# Patient Record
Sex: Male | Born: 1950 | Race: White | Hispanic: No | Smoking: Never smoker
Health system: Southern US, Community
[De-identification: ages and names within clinical notes are randomized; demographics above are authoritative.]

## PROBLEM LIST (undated history)

## (undated) DIAGNOSIS — M1990 Unspecified osteoarthritis, unspecified site: Secondary | ICD-10-CM

## (undated) DIAGNOSIS — E785 Hyperlipidemia, unspecified: Secondary | ICD-10-CM

## (undated) DIAGNOSIS — I10 Essential (primary) hypertension: Secondary | ICD-10-CM

## (undated) DIAGNOSIS — Z9989 Dependence on other enabling machines and devices: Secondary | ICD-10-CM

## (undated) DIAGNOSIS — F419 Anxiety disorder, unspecified: Secondary | ICD-10-CM

## (undated) DIAGNOSIS — H5440 Blindness, one eye, unspecified eye: Secondary | ICD-10-CM

## (undated) DIAGNOSIS — Z87448 Personal history of other diseases of urinary system: Secondary | ICD-10-CM

## (undated) DIAGNOSIS — G4733 Obstructive sleep apnea (adult) (pediatric): Secondary | ICD-10-CM

## (undated) DIAGNOSIS — I5032 Chronic diastolic (congestive) heart failure: Secondary | ICD-10-CM

## (undated) DIAGNOSIS — E119 Type 2 diabetes mellitus without complications: Secondary | ICD-10-CM

## (undated) DIAGNOSIS — Z8614 Personal history of Methicillin resistant Staphylococcus aureus infection: Secondary | ICD-10-CM

## (undated) DIAGNOSIS — K759 Inflammatory liver disease, unspecified: Secondary | ICD-10-CM

## (undated) DIAGNOSIS — I350 Nonrheumatic aortic (valve) stenosis: Secondary | ICD-10-CM

## (undated) HISTORY — DX: Anxiety disorder, unspecified: F41.9

## (undated) HISTORY — DX: Chronic diastolic (congestive) heart failure: I50.32

## (undated) HISTORY — DX: Blindness, one eye, unspecified eye: H54.40

## (undated) HISTORY — DX: Inflammatory liver disease, unspecified: K75.9

## (undated) HISTORY — DX: Personal history of Methicillin resistant Staphylococcus aureus infection: Z86.14

## (undated) HISTORY — PX: BACK SURGERY: SHX140

## (undated) HISTORY — DX: Essential (primary) hypertension: I10

## (undated) HISTORY — PX: CARDIAC CATHETERIZATION: SHX172

## (undated) HISTORY — PX: CARDIAC VALVE REPLACEMENT: SHX585

## (undated) HISTORY — DX: Hyperlipidemia, unspecified: E78.5

## (undated) HISTORY — DX: Nonrheumatic aortic (valve) stenosis: I35.0

## (undated) HISTORY — DX: Type 2 diabetes mellitus without complications: E11.9

---

## 1983-10-30 HISTORY — PX: LUMBAR LAMINECTOMY: SHX95

## 1998-02-01 ENCOUNTER — Encounter: Admission: RE | Admit: 1998-02-01 | Discharge: 1998-02-01 | Payer: Self-pay | Admitting: Sports Medicine

## 1998-02-24 ENCOUNTER — Inpatient Hospital Stay (HOSPITAL_COMMUNITY): Admission: EM | Admit: 1998-02-24 | Discharge: 1998-02-25 | Payer: Self-pay | Admitting: Emergency Medicine

## 1998-03-18 ENCOUNTER — Encounter: Admission: RE | Admit: 1998-03-18 | Discharge: 1998-03-18 | Payer: Self-pay | Admitting: Family Medicine

## 1998-03-18 ENCOUNTER — Other Ambulatory Visit: Admission: RE | Admit: 1998-03-18 | Discharge: 1998-03-18 | Payer: Self-pay | Admitting: *Deleted

## 1998-04-20 ENCOUNTER — Encounter: Admission: RE | Admit: 1998-04-20 | Discharge: 1998-04-20 | Payer: Self-pay | Admitting: Family Medicine

## 1998-09-05 ENCOUNTER — Encounter: Admission: RE | Admit: 1998-09-05 | Discharge: 1998-09-05 | Payer: Self-pay | Admitting: Family Medicine

## 1998-09-21 ENCOUNTER — Encounter: Admission: RE | Admit: 1998-09-21 | Discharge: 1998-09-21 | Payer: Self-pay | Admitting: Family Medicine

## 1998-10-24 ENCOUNTER — Encounter: Admission: RE | Admit: 1998-10-24 | Discharge: 1998-10-24 | Payer: Self-pay | Admitting: Family Medicine

## 1998-10-31 ENCOUNTER — Encounter: Admission: RE | Admit: 1998-10-31 | Discharge: 1998-10-31 | Payer: Self-pay | Admitting: Family Medicine

## 1999-03-13 ENCOUNTER — Encounter: Admission: RE | Admit: 1999-03-13 | Discharge: 1999-03-13 | Payer: Self-pay | Admitting: Family Medicine

## 1999-04-24 ENCOUNTER — Encounter: Admission: RE | Admit: 1999-04-24 | Discharge: 1999-04-24 | Payer: Self-pay | Admitting: Family Medicine

## 1999-07-28 ENCOUNTER — Encounter: Admission: RE | Admit: 1999-07-28 | Discharge: 1999-07-28 | Payer: Self-pay | Admitting: Family Medicine

## 1999-10-04 ENCOUNTER — Encounter: Admission: RE | Admit: 1999-10-04 | Discharge: 1999-10-04 | Payer: Self-pay | Admitting: Family Medicine

## 2000-01-03 ENCOUNTER — Encounter: Payer: Self-pay | Admitting: Sports Medicine

## 2000-01-03 ENCOUNTER — Encounter: Admission: RE | Admit: 2000-01-03 | Discharge: 2000-01-03 | Payer: Self-pay | Admitting: Sports Medicine

## 2000-01-03 ENCOUNTER — Encounter: Admission: RE | Admit: 2000-01-03 | Discharge: 2000-01-03 | Payer: Self-pay | Admitting: Family Medicine

## 2000-01-17 ENCOUNTER — Encounter: Admission: RE | Admit: 2000-01-17 | Discharge: 2000-01-17 | Payer: Self-pay | Admitting: Family Medicine

## 2000-02-15 ENCOUNTER — Encounter: Admission: RE | Admit: 2000-02-15 | Discharge: 2000-02-15 | Payer: Self-pay | Admitting: Sports Medicine

## 2000-02-16 ENCOUNTER — Encounter: Admission: RE | Admit: 2000-02-16 | Discharge: 2000-02-16 | Payer: Self-pay | Admitting: Family Medicine

## 2000-02-22 ENCOUNTER — Encounter: Admission: RE | Admit: 2000-02-22 | Discharge: 2000-02-22 | Payer: Self-pay | Admitting: Family Medicine

## 2000-06-18 ENCOUNTER — Encounter: Admission: RE | Admit: 2000-06-18 | Discharge: 2000-06-18 | Payer: Self-pay | Admitting: Sports Medicine

## 2000-12-05 ENCOUNTER — Encounter: Admission: RE | Admit: 2000-12-05 | Discharge: 2000-12-05 | Payer: Self-pay | Admitting: Family Medicine

## 2000-12-31 ENCOUNTER — Encounter: Admission: RE | Admit: 2000-12-31 | Discharge: 2000-12-31 | Payer: Self-pay | Admitting: Family Medicine

## 2001-02-04 ENCOUNTER — Encounter: Admission: RE | Admit: 2001-02-04 | Discharge: 2001-02-04 | Payer: Self-pay | Admitting: Family Medicine

## 2001-02-19 ENCOUNTER — Encounter: Admission: RE | Admit: 2001-02-19 | Discharge: 2001-02-19 | Payer: Self-pay | Admitting: Family Medicine

## 2001-03-05 ENCOUNTER — Encounter: Admission: RE | Admit: 2001-03-05 | Discharge: 2001-03-05 | Payer: Self-pay | Admitting: Family Medicine

## 2001-04-04 ENCOUNTER — Encounter: Admission: RE | Admit: 2001-04-04 | Discharge: 2001-04-04 | Payer: Self-pay | Admitting: Family Medicine

## 2001-04-28 ENCOUNTER — Encounter: Admission: RE | Admit: 2001-04-28 | Discharge: 2001-04-28 | Payer: Self-pay | Admitting: Family Medicine

## 2001-04-28 ENCOUNTER — Ambulatory Visit (HOSPITAL_COMMUNITY): Admission: RE | Admit: 2001-04-28 | Discharge: 2001-04-28 | Payer: Self-pay | Admitting: Family Medicine

## 2001-06-25 ENCOUNTER — Encounter: Admission: RE | Admit: 2001-06-25 | Discharge: 2001-06-25 | Payer: Self-pay | Admitting: Family Medicine

## 2001-11-18 ENCOUNTER — Encounter: Admission: RE | Admit: 2001-11-18 | Discharge: 2001-11-18 | Payer: Self-pay | Admitting: Family Medicine

## 2001-12-05 ENCOUNTER — Encounter: Admission: RE | Admit: 2001-12-05 | Discharge: 2001-12-05 | Payer: Self-pay | Admitting: Family Medicine

## 2001-12-22 ENCOUNTER — Encounter: Admission: RE | Admit: 2001-12-22 | Discharge: 2001-12-22 | Payer: Self-pay | Admitting: Family Medicine

## 2002-01-08 ENCOUNTER — Encounter: Admission: RE | Admit: 2002-01-08 | Discharge: 2002-01-08 | Payer: Self-pay | Admitting: Family Medicine

## 2002-02-02 ENCOUNTER — Encounter: Admission: RE | Admit: 2002-02-02 | Discharge: 2002-02-02 | Payer: Self-pay | Admitting: Family Medicine

## 2002-02-09 ENCOUNTER — Emergency Department (HOSPITAL_COMMUNITY): Admission: EM | Admit: 2002-02-09 | Discharge: 2002-02-09 | Payer: Self-pay | Admitting: Emergency Medicine

## 2002-02-24 ENCOUNTER — Encounter: Admission: RE | Admit: 2002-02-24 | Discharge: 2002-02-24 | Payer: Self-pay | Admitting: Family Medicine

## 2002-12-08 ENCOUNTER — Encounter: Admission: RE | Admit: 2002-12-08 | Discharge: 2002-12-08 | Payer: Self-pay | Admitting: Family Medicine

## 2002-12-23 ENCOUNTER — Encounter: Admission: RE | Admit: 2002-12-23 | Discharge: 2002-12-23 | Payer: Self-pay | Admitting: Family Medicine

## 2003-01-14 ENCOUNTER — Encounter: Admission: RE | Admit: 2003-01-14 | Discharge: 2003-01-14 | Payer: Self-pay | Admitting: Family Medicine

## 2003-01-29 ENCOUNTER — Encounter: Admission: RE | Admit: 2003-01-29 | Discharge: 2003-01-29 | Payer: Self-pay | Admitting: Family Medicine

## 2003-02-25 ENCOUNTER — Encounter: Payer: Self-pay | Admitting: Sports Medicine

## 2003-02-25 ENCOUNTER — Encounter: Admission: RE | Admit: 2003-02-25 | Discharge: 2003-02-25 | Payer: Self-pay | Admitting: Sports Medicine

## 2003-02-25 ENCOUNTER — Encounter: Admission: RE | Admit: 2003-02-25 | Discharge: 2003-02-25 | Payer: Self-pay | Admitting: Family Medicine

## 2003-03-04 ENCOUNTER — Encounter: Admission: RE | Admit: 2003-03-04 | Discharge: 2003-03-04 | Payer: Self-pay | Admitting: Family Medicine

## 2003-03-15 ENCOUNTER — Encounter: Admission: RE | Admit: 2003-03-15 | Discharge: 2003-03-22 | Payer: Self-pay | Admitting: Sports Medicine

## 2003-03-18 ENCOUNTER — Encounter: Admission: RE | Admit: 2003-03-18 | Discharge: 2003-03-18 | Payer: Self-pay | Admitting: Sports Medicine

## 2003-04-06 ENCOUNTER — Encounter: Admission: RE | Admit: 2003-04-06 | Discharge: 2003-04-06 | Payer: Self-pay | Admitting: Sports Medicine

## 2003-05-31 ENCOUNTER — Encounter: Admission: RE | Admit: 2003-05-31 | Discharge: 2003-05-31 | Payer: Self-pay | Admitting: Sports Medicine

## 2003-06-01 ENCOUNTER — Encounter: Admission: RE | Admit: 2003-06-01 | Discharge: 2003-06-01 | Payer: Self-pay | Admitting: Family Medicine

## 2003-06-25 ENCOUNTER — Encounter: Admission: RE | Admit: 2003-06-25 | Discharge: 2003-06-25 | Payer: Self-pay | Admitting: Family Medicine

## 2003-08-17 ENCOUNTER — Encounter: Admission: RE | Admit: 2003-08-17 | Discharge: 2003-08-17 | Payer: Self-pay | Admitting: Sports Medicine

## 2003-09-10 ENCOUNTER — Emergency Department (HOSPITAL_COMMUNITY): Admission: EM | Admit: 2003-09-10 | Discharge: 2003-09-10 | Payer: Self-pay | Admitting: Emergency Medicine

## 2003-12-20 ENCOUNTER — Encounter: Admission: RE | Admit: 2003-12-20 | Discharge: 2003-12-20 | Payer: Self-pay | Admitting: Family Medicine

## 2004-09-01 ENCOUNTER — Ambulatory Visit: Payer: Self-pay | Admitting: Family Medicine

## 2004-10-03 ENCOUNTER — Ambulatory Visit (HOSPITAL_COMMUNITY): Admission: RE | Admit: 2004-10-03 | Discharge: 2004-10-03 | Payer: Self-pay | Admitting: Sports Medicine

## 2004-10-03 ENCOUNTER — Encounter (INDEPENDENT_AMBULATORY_CARE_PROVIDER_SITE_OTHER): Payer: Self-pay | Admitting: Cardiology

## 2004-10-19 ENCOUNTER — Ambulatory Visit: Payer: Self-pay | Admitting: Family Medicine

## 2004-11-14 ENCOUNTER — Encounter: Admission: RE | Admit: 2004-11-14 | Discharge: 2005-02-12 | Payer: Self-pay | Admitting: Family Medicine

## 2005-02-20 ENCOUNTER — Ambulatory Visit: Payer: Self-pay | Admitting: Family Medicine

## 2005-03-08 ENCOUNTER — Ambulatory Visit: Payer: Self-pay | Admitting: Family Medicine

## 2005-07-31 ENCOUNTER — Ambulatory Visit: Payer: Self-pay | Admitting: Sports Medicine

## 2005-08-08 ENCOUNTER — Ambulatory Visit: Payer: Self-pay | Admitting: Family Medicine

## 2005-08-21 ENCOUNTER — Ambulatory Visit: Payer: Self-pay | Admitting: Gastroenterology

## 2005-08-29 HISTORY — PX: INCISION AND DRAINAGE ABSCESS: SHX5864

## 2005-09-03 ENCOUNTER — Ambulatory Visit: Payer: Self-pay | Admitting: Family Medicine

## 2005-09-04 ENCOUNTER — Ambulatory Visit: Payer: Self-pay | Admitting: Infectious Diseases

## 2005-09-04 ENCOUNTER — Ambulatory Visit: Payer: Self-pay | Admitting: Family Medicine

## 2005-09-04 ENCOUNTER — Inpatient Hospital Stay (HOSPITAL_COMMUNITY): Admission: AD | Admit: 2005-09-04 | Discharge: 2005-09-17 | Payer: Self-pay | Admitting: General Surgery

## 2005-09-10 ENCOUNTER — Encounter: Payer: Self-pay | Admitting: Internal Medicine

## 2005-09-10 ENCOUNTER — Ambulatory Visit: Payer: Self-pay | Admitting: Internal Medicine

## 2005-09-11 ENCOUNTER — Ambulatory Visit: Payer: Self-pay | Admitting: Internal Medicine

## 2005-09-12 ENCOUNTER — Ambulatory Visit: Payer: Self-pay | Admitting: Cardiology

## 2005-09-12 ENCOUNTER — Encounter: Payer: Self-pay | Admitting: Cardiology

## 2005-09-19 ENCOUNTER — Ambulatory Visit: Payer: Self-pay | Admitting: Family Medicine

## 2005-10-10 ENCOUNTER — Ambulatory Visit: Payer: Self-pay | Admitting: Family Medicine

## 2005-11-02 ENCOUNTER — Ambulatory Visit: Payer: Self-pay | Admitting: Family Medicine

## 2006-02-22 ENCOUNTER — Ambulatory Visit: Payer: Self-pay | Admitting: Family Medicine

## 2006-02-28 ENCOUNTER — Ambulatory Visit: Payer: Self-pay | Admitting: Family Medicine

## 2006-07-22 ENCOUNTER — Ambulatory Visit: Payer: Self-pay | Admitting: Family Medicine

## 2006-07-26 ENCOUNTER — Ambulatory Visit: Payer: Self-pay | Admitting: Family Medicine

## 2006-07-31 ENCOUNTER — Ambulatory Visit: Payer: Self-pay | Admitting: Family Medicine

## 2006-09-02 ENCOUNTER — Ambulatory Visit: Payer: Self-pay | Admitting: Sports Medicine

## 2006-12-26 DIAGNOSIS — E669 Obesity, unspecified: Secondary | ICD-10-CM

## 2006-12-26 DIAGNOSIS — I10 Essential (primary) hypertension: Secondary | ICD-10-CM | POA: Insufficient documentation

## 2006-12-26 DIAGNOSIS — E785 Hyperlipidemia, unspecified: Secondary | ICD-10-CM

## 2006-12-26 DIAGNOSIS — M109 Gout, unspecified: Secondary | ICD-10-CM | POA: Insufficient documentation

## 2006-12-26 DIAGNOSIS — I871 Compression of vein: Secondary | ICD-10-CM

## 2006-12-26 DIAGNOSIS — M479 Spondylosis, unspecified: Secondary | ICD-10-CM | POA: Insufficient documentation

## 2007-06-03 ENCOUNTER — Telehealth (INDEPENDENT_AMBULATORY_CARE_PROVIDER_SITE_OTHER): Payer: Self-pay | Admitting: *Deleted

## 2007-06-12 ENCOUNTER — Ambulatory Visit: Payer: Self-pay | Admitting: Family Medicine

## 2007-06-12 ENCOUNTER — Encounter (INDEPENDENT_AMBULATORY_CARE_PROVIDER_SITE_OTHER): Payer: Self-pay | Admitting: Family Medicine

## 2007-06-12 LAB — CONVERTED CEMR LAB
ALT: 22 units/L (ref 0–53)
AST: 19 units/L (ref 0–37)
Albumin: 4 g/dL (ref 3.5–5.2)
Alkaline Phosphatase: 117 units/L (ref 39–117)
BUN: 18 mg/dL (ref 6–23)
Bilirubin Urine: NEGATIVE
Blood in Urine, dipstick: NEGATIVE
CO2: 28 meq/L (ref 19–32)
Calcium: 8.8 mg/dL (ref 8.4–10.5)
Chloride: 98 meq/L (ref 96–112)
Cholesterol: 268 mg/dL — ABNORMAL HIGH (ref 0–200)
Creatinine, Ser: 0.97 mg/dL (ref 0.40–1.50)
Glucose, Bld: 371 mg/dL — ABNORMAL HIGH (ref 70–99)
Glucose, Urine, Semiquant: 500
HDL: 36 mg/dL — ABNORMAL LOW (ref >39)
Hgb A1c MFr Bld: >14 %
Ketones, urine, test strip: NEGATIVE
Nitrite: NEGATIVE
Potassium: 4.6 meq/L (ref 3.5–5.3)
Protein, U semiquant: NEGATIVE
Sodium: 140 meq/L (ref 135–145)
Specific Gravity, Urine: 1.01
TSH: 2.972 microintl units/mL (ref 0.350–5.50)
Total Bilirubin: 0.5 mg/dL (ref 0.3–1.2)
Total CHOL/HDL Ratio: 7.4
Total Protein: 7.2 g/dL (ref 6.0–8.3)
Triglycerides: 575 mg/dL — ABNORMAL HIGH (ref <150)
Urobilinogen, UA: 0.2
WBC Urine, dipstick: NEGATIVE
pH: 5.5

## 2007-08-07 ENCOUNTER — Telehealth (INDEPENDENT_AMBULATORY_CARE_PROVIDER_SITE_OTHER): Payer: Self-pay | Admitting: *Deleted

## 2007-08-07 ENCOUNTER — Ambulatory Visit: Payer: Self-pay | Admitting: Family Medicine

## 2007-09-03 ENCOUNTER — Ambulatory Visit: Payer: Self-pay | Admitting: Family Medicine

## 2007-09-03 ENCOUNTER — Encounter (INDEPENDENT_AMBULATORY_CARE_PROVIDER_SITE_OTHER): Payer: Self-pay | Admitting: Family Medicine

## 2007-09-03 LAB — CONVERTED CEMR LAB: Hgb A1c MFr Bld: >14 %

## 2007-09-05 ENCOUNTER — Telehealth: Payer: Self-pay | Admitting: *Deleted

## 2007-09-08 ENCOUNTER — Encounter: Admission: RE | Admit: 2007-09-08 | Discharge: 2007-09-08 | Payer: Self-pay | Admitting: Family Medicine

## 2007-09-08 ENCOUNTER — Encounter (INDEPENDENT_AMBULATORY_CARE_PROVIDER_SITE_OTHER): Payer: Self-pay | Admitting: Family Medicine

## 2007-09-15 ENCOUNTER — Encounter (INDEPENDENT_AMBULATORY_CARE_PROVIDER_SITE_OTHER): Payer: Self-pay | Admitting: Family Medicine

## 2007-09-15 LAB — CONVERTED CEMR LAB
Direct LDL: 106 mg/dL — ABNORMAL HIGH
Triglycerides: 792 mg/dL — ABNORMAL HIGH (ref <150)

## 2007-09-29 ENCOUNTER — Encounter (INDEPENDENT_AMBULATORY_CARE_PROVIDER_SITE_OTHER): Payer: Self-pay | Admitting: Family Medicine

## 2007-10-09 ENCOUNTER — Ambulatory Visit: Payer: Self-pay | Admitting: Family Medicine

## 2008-02-13 ENCOUNTER — Telehealth (INDEPENDENT_AMBULATORY_CARE_PROVIDER_SITE_OTHER): Payer: Self-pay | Admitting: Family Medicine

## 2008-02-13 ENCOUNTER — Encounter: Payer: Self-pay | Admitting: Family Medicine

## 2008-03-05 ENCOUNTER — Encounter (INDEPENDENT_AMBULATORY_CARE_PROVIDER_SITE_OTHER): Payer: Self-pay | Admitting: Family Medicine

## 2008-03-08 ENCOUNTER — Encounter (INDEPENDENT_AMBULATORY_CARE_PROVIDER_SITE_OTHER): Payer: Self-pay | Admitting: Family Medicine

## 2008-04-13 ENCOUNTER — Encounter (INDEPENDENT_AMBULATORY_CARE_PROVIDER_SITE_OTHER): Payer: Self-pay | Admitting: Family Medicine

## 2008-05-11 ENCOUNTER — Telehealth (INDEPENDENT_AMBULATORY_CARE_PROVIDER_SITE_OTHER): Payer: Self-pay | Admitting: Family Medicine

## 2008-08-24 ENCOUNTER — Ambulatory Visit: Payer: Self-pay | Admitting: Family Medicine

## 2008-08-24 LAB — CONVERTED CEMR LAB: Hgb A1c MFr Bld: 13.3 %

## 2008-09-15 ENCOUNTER — Ambulatory Visit: Payer: Self-pay | Admitting: Family Medicine

## 2008-09-15 ENCOUNTER — Encounter (INDEPENDENT_AMBULATORY_CARE_PROVIDER_SITE_OTHER): Payer: Self-pay | Admitting: Family Medicine

## 2008-09-15 LAB — CONVERTED CEMR LAB
ALT: 28 units/L (ref 0–53)
AST: 30 units/L (ref 0–37)
Albumin: 4.2 g/dL (ref 3.5–5.2)
Alkaline Phosphatase: 67 units/L (ref 39–117)
BUN: 27 mg/dL — ABNORMAL HIGH (ref 6–23)
CO2: 22 meq/L (ref 19–32)
Calcium: 9.1 mg/dL (ref 8.4–10.5)
Chloride: 98 meq/L (ref 96–112)
Cholesterol: 184 mg/dL (ref 0–200)
Creatinine, Ser: 1.16 mg/dL (ref 0.40–1.50)
Glucose, Bld: 280 mg/dL — ABNORMAL HIGH (ref 70–99)
HDL: 33 mg/dL — ABNORMAL LOW (ref >39)
LDL Cholesterol: 114 mg/dL — ABNORMAL HIGH (ref 0–99)
PSA: 0.14 ng/mL (ref 0.10–4.00)
Potassium: 4.2 meq/L (ref 3.5–5.3)
Sodium: 136 meq/L (ref 135–145)
Total Bilirubin: 0.6 mg/dL (ref 0.3–1.2)
Total Protein: 7 g/dL (ref 6.0–8.3)
Triglycerides: 183 mg/dL — ABNORMAL HIGH (ref <150)
VLDL: 37 mg/dL (ref 0–40)

## 2008-09-17 ENCOUNTER — Encounter (INDEPENDENT_AMBULATORY_CARE_PROVIDER_SITE_OTHER): Payer: Self-pay | Admitting: Family Medicine

## 2008-09-30 ENCOUNTER — Ambulatory Visit: Payer: Self-pay | Admitting: Family Medicine

## 2008-10-07 ENCOUNTER — Telehealth (INDEPENDENT_AMBULATORY_CARE_PROVIDER_SITE_OTHER): Payer: Self-pay | Admitting: Family Medicine

## 2008-12-08 ENCOUNTER — Telehealth (INDEPENDENT_AMBULATORY_CARE_PROVIDER_SITE_OTHER): Payer: Self-pay | Admitting: *Deleted

## 2008-12-22 ENCOUNTER — Telehealth (INDEPENDENT_AMBULATORY_CARE_PROVIDER_SITE_OTHER): Payer: Self-pay | Admitting: *Deleted

## 2009-03-08 ENCOUNTER — Ambulatory Visit: Payer: Self-pay | Admitting: Family Medicine

## 2009-03-08 ENCOUNTER — Encounter (INDEPENDENT_AMBULATORY_CARE_PROVIDER_SITE_OTHER): Payer: Self-pay | Admitting: Family Medicine

## 2009-03-08 LAB — CONVERTED CEMR LAB: Hgb A1c MFr Bld: 12.2 %

## 2009-08-09 ENCOUNTER — Telehealth: Payer: Self-pay | Admitting: Family Medicine

## 2009-08-12 ENCOUNTER — Ambulatory Visit: Payer: Self-pay | Admitting: Family Medicine

## 2009-08-12 LAB — CONVERTED CEMR LAB: Hgb A1c MFr Bld: 13.4 %

## 2009-11-12 ENCOUNTER — Encounter: Payer: Self-pay | Admitting: Family Medicine

## 2009-11-24 ENCOUNTER — Encounter: Payer: Self-pay | Admitting: Family Medicine

## 2010-02-09 ENCOUNTER — Telehealth: Payer: Self-pay | Admitting: Family Medicine

## 2010-02-10 ENCOUNTER — Ambulatory Visit: Payer: Self-pay | Admitting: Family Medicine

## 2010-02-10 LAB — CONVERTED CEMR LAB: Hgb A1c MFr Bld: 12.7 %

## 2010-05-16 ENCOUNTER — Encounter: Payer: Self-pay | Admitting: Family Medicine

## 2010-05-19 ENCOUNTER — Encounter: Payer: Self-pay | Admitting: Family Medicine

## 2010-05-19 ENCOUNTER — Ambulatory Visit: Payer: Self-pay | Admitting: Family Medicine

## 2010-05-19 LAB — CONVERTED CEMR LAB
ALT: 15 units/L (ref 0–53)
AST: 13 units/L (ref 0–37)
Albumin: 4.1 g/dL (ref 3.5–5.2)
Alkaline Phosphatase: 77 units/L (ref 39–117)
BUN: 22 mg/dL (ref 6–23)
CO2: 27 meq/L (ref 19–32)
Calcium: 9.4 mg/dL (ref 8.4–10.5)
Chloride: 98 meq/L (ref 96–112)
Cholesterol: 195 mg/dL (ref 0–200)
Creatinine, Ser: 0.96 mg/dL (ref 0.40–1.50)
Glucose, Bld: 293 mg/dL — ABNORMAL HIGH (ref 70–99)
HDL: 35 mg/dL — ABNORMAL LOW (ref >39)
Hgb A1c MFr Bld: 11.7 %
LDL Cholesterol: 109 mg/dL — ABNORMAL HIGH (ref 0–99)
Potassium: 4.6 meq/L (ref 3.5–5.3)
Sodium: 135 meq/L (ref 135–145)
Total Bilirubin: 0.4 mg/dL (ref 0.3–1.2)
Total CHOL/HDL Ratio: 5.6
Total Protein: 7.4 g/dL (ref 6.0–8.3)
Triglycerides: 254 mg/dL — ABNORMAL HIGH (ref <150)
VLDL: 51 mg/dL — ABNORMAL HIGH (ref 0–40)

## 2010-05-22 ENCOUNTER — Encounter: Payer: Self-pay | Admitting: Family Medicine

## 2010-06-01 ENCOUNTER — Ambulatory Visit: Payer: Self-pay | Admitting: Family Medicine

## 2010-08-18 ENCOUNTER — Emergency Department (HOSPITAL_BASED_OUTPATIENT_CLINIC_OR_DEPARTMENT_OTHER)
Admission: EM | Admit: 2010-08-18 | Discharge: 2010-08-18 | Payer: Self-pay | Source: Home / Self Care | Admitting: Emergency Medicine

## 2010-09-26 ENCOUNTER — Ambulatory Visit: Payer: Self-pay | Admitting: Family Medicine

## 2010-09-26 DIAGNOSIS — I350 Nonrheumatic aortic (valve) stenosis: Secondary | ICD-10-CM | POA: Insufficient documentation

## 2010-09-26 LAB — CONVERTED CEMR LAB: Hgb A1c MFr Bld: 12.9 %

## 2010-10-10 ENCOUNTER — Ambulatory Visit (HOSPITAL_COMMUNITY)
Admission: RE | Admit: 2010-10-10 | Discharge: 2010-10-10 | Payer: Self-pay | Source: Home / Self Care | Attending: Family Medicine | Admitting: Family Medicine

## 2010-10-10 ENCOUNTER — Encounter: Payer: Self-pay | Admitting: Family Medicine

## 2010-10-12 ENCOUNTER — Ambulatory Visit: Payer: Self-pay

## 2010-10-12 ENCOUNTER — Telehealth: Payer: Self-pay | Admitting: Family Medicine

## 2010-10-16 ENCOUNTER — Ambulatory Visit: Payer: Self-pay | Admitting: Cardiovascular Disease

## 2010-10-16 ENCOUNTER — Encounter: Payer: Self-pay | Admitting: Cardiovascular Disease

## 2010-11-01 ENCOUNTER — Telehealth (INDEPENDENT_AMBULATORY_CARE_PROVIDER_SITE_OTHER): Payer: Self-pay | Admitting: *Deleted

## 2010-11-02 ENCOUNTER — Ambulatory Visit: Admission: RE | Admit: 2010-11-02 | Discharge: 2010-11-02 | Payer: Self-pay | Source: Home / Self Care

## 2010-11-02 ENCOUNTER — Encounter (HOSPITAL_COMMUNITY)
Admission: RE | Admit: 2010-11-02 | Discharge: 2010-11-28 | Payer: Self-pay | Source: Home / Self Care | Attending: Cardiovascular Disease | Admitting: Cardiovascular Disease

## 2010-11-02 ENCOUNTER — Encounter: Payer: Self-pay | Admitting: Cardiology

## 2010-11-06 ENCOUNTER — Ambulatory Visit: Admission: RE | Admit: 2010-11-06 | Discharge: 2010-11-06 | Payer: Self-pay | Source: Home / Self Care

## 2010-11-15 ENCOUNTER — Ambulatory Visit
Admission: RE | Admit: 2010-11-15 | Discharge: 2010-11-15 | Payer: Self-pay | Source: Home / Self Care | Attending: Cardiovascular Disease | Admitting: Cardiovascular Disease

## 2010-11-15 ENCOUNTER — Encounter: Payer: Self-pay | Admitting: Cardiovascular Disease

## 2010-11-15 DIAGNOSIS — R9439 Abnormal result of other cardiovascular function study: Secondary | ICD-10-CM | POA: Insufficient documentation

## 2010-11-16 ENCOUNTER — Ambulatory Visit: Admission: RE | Admit: 2010-11-16 | Discharge: 2010-11-16 | Payer: Self-pay | Source: Home / Self Care

## 2010-11-16 ENCOUNTER — Other Ambulatory Visit: Payer: Self-pay | Admitting: Cardiovascular Disease

## 2010-11-16 LAB — BASIC METABOLIC PANEL
BUN: 23 mg/dL (ref 6–23)
CO2: 30 mEq/L (ref 19–32)
Calcium: 8.7 mg/dL (ref 8.4–10.5)
Chloride: 97 mEq/L (ref 96–112)
Creatinine, Ser: 1 mg/dL (ref 0.4–1.5)
GFR: 82.89 mL/min (ref >60.00)
Glucose, Bld: 253 mg/dL — ABNORMAL HIGH (ref 70–99)
Potassium: 4.4 mEq/L (ref 3.5–5.1)
Sodium: 135 mEq/L (ref 135–145)

## 2010-11-16 LAB — CBC WITH DIFFERENTIAL/PLATELET
Basophils Absolute: 0 10*3/uL (ref 0.0–0.1)
Basophils Relative: 0.4 % (ref 0.0–3.0)
Eosinophils Absolute: 0.2 10*3/uL (ref 0.0–0.7)
Eosinophils Relative: 1.7 % (ref 0.0–5.0)
HCT: 44.2 % (ref 39.0–52.0)
Hemoglobin: 14.6 g/dL (ref 13.0–17.0)
Lymphocytes Relative: 28.6 % (ref 12.0–46.0)
Lymphs Abs: 2.8 10*3/uL (ref 0.7–4.0)
MCHC: 33.1 g/dL (ref 30.0–36.0)
MCV: 84.8 fl (ref 78.0–100.0)
Monocytes Absolute: 0.6 10*3/uL (ref 0.1–1.0)
Monocytes Relative: 6.3 % (ref 3.0–12.0)
Neutro Abs: 6.1 10*3/uL (ref 1.4–7.7)
Neutrophils Relative %: 63 % (ref 43.0–77.0)
Platelets: 359 10*3/uL (ref 150.0–400.0)
RBC: 5.21 Mil/uL (ref 4.22–5.81)
RDW: 14.4 % (ref 11.5–14.6)
WBC: 9.7 10*3/uL (ref 4.5–10.5)

## 2010-11-16 LAB — PROTIME-INR
INR: 1.5 ratio — ABNORMAL HIGH (ref 0.8–1.0)
Prothrombin Time: 15.8 s — ABNORMAL HIGH (ref 10.2–12.4)

## 2010-11-21 ENCOUNTER — Ambulatory Visit
Admission: RE | Admit: 2010-11-21 | Payer: Self-pay | Source: Home / Self Care | Attending: Cardiovascular Disease | Admitting: Cardiovascular Disease

## 2010-11-22 LAB — POCT I-STAT 3, VENOUS BLOOD GAS (G3P V)
Acid-Base Excess: 1 mmol/L (ref 0.0–2.0)
Bicarbonate: 26.4 mEq/L — ABNORMAL HIGH (ref 20.0–24.0)
O2 Saturation: 64 %
O2 Saturation: 94 %
TCO2: 30 mmol/L (ref 0–100)
TCO2: 28 mmol/L (ref 0–100)
pCO2, Ven: 52.1 mmHg — ABNORMAL HIGH (ref 45.0–50.0)
pCO2, Ven: 43.1 mmHg — ABNORMAL LOW (ref 45.0–50.0)
pH, Ven: 7.396 — ABNORMAL HIGH (ref 7.250–7.300)
pO2, Ven: 36 mmHg (ref 30.0–45.0)
pO2, Ven: 71 mmHg — ABNORMAL HIGH (ref 30.0–45.0)

## 2010-11-22 LAB — GLUCOSE, POCT (MANUAL RESULT ENTRY)
Glucose, Bld: 103 mg/dL — ABNORMAL HIGH (ref 70–99)
Operator id: 221371

## 2010-11-22 NOTE — Procedures (Addendum)
Antonio [name], RATHE NO.:  [id number]  MEDICAL RECORD NO.:  [id number]          PATIENT TYPE:  OIB  LOCATION:  1962                         FACILITY:  [facility]  PHYSICIAN:  Verne Carrow, MDDATE OF BIRTH:  [birth year]  DATE OF PROCEDURE:  11/21/2010 DATE OF DISCHARGE:  11/21/2010                           CARDIAC CATHETERIZATION   PRIMARY CARE PHYSICIAN:  Ellery Plunk, MD  PROCEDURES PERFORMED: 1. Left heart catheterization. 2. Right heart catheterization. 3. Selective coronary angiography. 4. Left ventricular angiogram.  OPERATOR:  Verne Carrow, MD  INDICATION:  This is a 60 year old obese Caucasian male with a history of hypertension, diabetes mellitus, and hyperlipidemia who has recently had exertional dyspnea.  His workup has included an echocardiogram, which showed moderate-to-severe aortic valve stenosis.  Because of his symptoms, we ordered a Lexiscan Myoview to exclude ischemia as a cause of his cardiomyopathy and found that his Myoview was abnormal with possible inferolateral scar with peri-infarct ischemia.  His ejection fraction was estimated at 34% on the Myoview.  Because of this, we arranged a right and left heart catheterization to evaluate for coronary artery disease as well as to evaluate his aortic valve stenosis.  DETAILS OF PROCEDURE:  The patient was brought to the outpatient cardiac catheterization laboratory after signing informed consent for the procedure.  The right groin was prepped and draped in sterile fashion. Lidocaine 1% was used for local anesthesia.  A 7-French sheath was inserted into the right femoral vein without difficulty.  A 5-French sheath was inserted into the right femoral artery without difficulty.  A balloon-tipped catheter was used to perform a right heart catheterization.  Standard diagnostic catheters were used to perform selective coronary angiography.  Ultimately, we used a JL-5 catheter  to engage the left main artery.  A no torque catheter was used to engage the native right coronary artery.  A pigtail catheter was used across the aortic valve into the left ventricle.  We did have some difficulty crossing the aortic valve because of the stenosis.  Soft straight-tipped wire was used to cross the aortic valve.  We then passed pigtail catheter over the wire into the left ventricle.  The patient tolerated the procedure well.  There were no immediate complications.  The patient was taken to the recovery room in a stable condition.  HEMODYNAMIC FINDINGS:  Central aortic pressure 117/76.  Left ventricular pressure 155/18.  Left ventricular end-diastolic pressure 27.  Right atrial pressure 10.  Right ventricular pressure 37/11.  Right ventricular end-diastolic pressure 12, pulmonary artery pressure 35/18 with a mean of 26, pulmonary capillary wedge pressure 15.  Cardiac output 5.6 L per minute.  Cardiac index 2.1 L per minute per meter squared.  Pulmonary artery saturation 64%.  Central aortic saturation 94%.  Calculated aortic valve area of 1.0 cm2 with a peak-to-peak gradient of 31 mmHg.  ANGIOGRAPHIC FINDINGS: 1. The left main coronary artery had no evidence of disease. 2. The circumflex artery gave off a large bifurcating obtuse marginal     branch that is free of disease.  The AV groove circumflex is     moderate-sized and had  no disease.  There was a small     posterolateral branch noted with no disease. 3. The left anterior descending artery was a large vessel that coursed     to the apex and wrapped around the apex.  There were several small-     caliber diagonal branches that were free of disease.  There were no     focal lesions noted in the left anterior descending artery. 4. The right coronary artery is a small and nondominant vessel that is     free of disease. 5. Left ventricular angiogram was performed in the RAO projection and     showed moderate left  ventricular systolic dysfunction with ejection     fraction of 35% to 40%.  IMPRESSION: 1. No angiographic evidence of coronary artery disease. 2. Moderate left ventricular systolic dysfunction secondary to     nonischemic cardiomyopathy. 3. Moderately severe aortic valve stenosis.  RECOMMENDATIONS:  This patient has moderate-to-severe aortic valve stenosis, but currently has minimal symptoms.  I cannot fully explain his degree of LV systolic dysfunction based on his valvular heart disease.  We will follow him closely.  He will ultimately need aortic valve replacement.  We plan on repeating an echocardiogram in 6 months. If there is any progression of his disease or any further changes in his LV systolic function then we will consider moving towards the aortic valve replacement.     Verne Carrow, MD     CM/MEDQ  D:  11/21/2010  T:  11/22/2010  Job:  [id number]  cc:   Ellery Plunk, MD  Electronically Signed by Verne Carrow MD on 11/22/2010 04:11:19 PM

## 2010-11-28 NOTE — Assessment & Plan Note (Signed)
Summary: boil/heart murmer,df   Vital Signs:  Patient profile:   60 year old male Height:      71 inches Weight:      347.5 pounds BMI:     48.64 Temp:     97.8 degrees F oral Pulse rate:   96 / minute BP sitting:   135 / 84  (left arm) Cuff size:   large  Vitals Entered By: Garen Grams LPN (September 26, 2010 2:53 PM) CC: DM, AS, weight loss Is Patient Diabetic? Yes Did you bring your meter with you today? No Pain Assessment Patient in pain? no        Primary Care Provider:  Ellery Plunk MD  CC:  DM, AS, and weight loss.  History of Present Illness: DM- working on increasing exercise.  taking meds as perscribed.  does not check CBGs.  does not want to start insulin at this time.  wants to continue diet and exercise for 3 more months.  nonsmoker, swims for exercise. ROS-complains of some weakness in legs, denies vision changes, HA, polyuria  AS- known AS, mild at last ECHO 2006.  concerned that murmur is loud per urgent care MD.  wants to have another ECHO done to eval murmur.  asymptomatic- denies syncope, HA, chest pain, increased edema  obesity- working on losing weight as above.  concerned about needing to cut back more on carbs.  Habits & Providers  Alcohol-Tobacco-Diet     Tobacco Status: never  Current Medications (verified): 1)  Amaryl 4 Mg Tabs (Glimepiride) .... Take 1 Tablet By Mouth Twice A Day 2)  Metformin Hcl 1000 Mg Tabs (Metformin Hcl) .Marland Kitchen.. 1 By Mouth Two Times A Day 3)  Bayer Childrens Aspirin 81 Mg Chew (Aspirin) .... Take 1 Tablet By Mouth Once A Day 4)  Lasix 40 Mg Tabs (Furosemide) .Marland Kitchen.. 1 By Mouth Bid 5)  Lisinopril 40 Mg Tabs (Lisinopril) .Marland Kitchen.. 1 By Mouth Daily 6)  Norvasc 10 Mg Tabs (Amlodipine Besylate) .... Take 1 Tablet By Mouth Once A Day 7)  Indomethacin 50 Mg Caps (Indomethacin) .Marland Kitchen.. 1 By Mouth Three Times A Day As Needed Gout Flare 8)  Lipitor 40 Mg Tabs (Atorvastatin Calcium) .... Take One By Mouth Daily 9)  Coreg 6.25 Mg Tabs  (Carvedilol) .... Take One Two Times A Day 10)  Tricor 145 Mg  Tabs (Fenofibrate) .Marland Kitchen.. 1 By Mouth Daily 11)  Lamisil At Athletes Foot 1 % Crea (Terbinafine Hcl) .... Apply To Affected Area Four Times Daily X 2 Weeks Disp: Qs 12)  Viagra 100 Mg Tabs (Sildenafil Citrate) .Marland Kitchen.. 1 Tab By Mouth 30 Minutes Prior To Sexual Intercourse 13)  Actos 30 Mg Tabs (Pioglitazone Hcl) .... Take One By Mouth Daily  Allergies (verified): No Known Drug Allergies  Review of Systems  The patient denies anorexia, fever, and vision loss.    Physical Exam  General:  NADalert and well-developed.  obese Lungs:  Normal respiratory effort, chest expands symmetrically. Lungs are clear to auscultation, no crackles or wheezes. Heart:  Normal rate and regular rhythm. S1 and S2 normal grade 3 systolic murmur radiates to carotids   Impression & Recommendations:  Problem # 1:  DIABETES MELLITUS, TYPE II, UNCONTROLLED (ICD-250.02) Assessment Deteriorated pt resistant to starting insulin but agrees to rediscuss in 3 months.  continue diet and exercise.  discussed with Dr. Deirdre Priest and will add Actos.   His updated medication list for this problem includes:    Amaryl 4 Mg Tabs (Glimepiride) .Marland Kitchen... Take 1  tablet by mouth twice a day    Metformin Hcl 1000 Mg Tabs (Metformin hcl) .Marland Kitchen... 1 by mouth two times a day    Bayer Childrens Aspirin 81 Mg Chew (Aspirin) .Marland Kitchen... Take 1 tablet by mouth once a day    Lisinopril 40 Mg Tabs (Lisinopril) .Marland Kitchen... 1 by mouth daily    Actos 30 Mg Tabs (Pioglitazone hcl) .Marland Kitchen... Take one by mouth daily  Orders: A1C-[facility] (38756) [facility]- Est  Level 4 (43329)  Problem # 2:  AORTIC STENOSIS (ICD-424.1) Assessment: Deteriorated murmur is louder but still asymptomatic.  no sign of heart failure, no syncope.  will send for repeat ECHO.  increased coreg as BP borderline and pulse elevated His updated medication list for this problem includes:    Bayer Childrens Aspirin 81 Mg Chew (Aspirin) .Marland Kitchen... Take 1  tablet by mouth once a day    Coreg 6.25 Mg Tabs (Carvedilol) .Marland Kitchen... Take one two times a day  Orders: 2 D Echo (2 D Echo) [facility]- Est  Level 4 (51884)  Problem # 3:  OBESITY, NOS (ICD-278.00) Assessment: Improved weight loss of 15 lbs.  will continue to monitor Orders: [facility]- Est  Level 4 (16606)  Complete Medication List: 1)  Amaryl 4 Mg Tabs (Glimepiride) .... Take 1 tablet by mouth twice a day 2)  Metformin Hcl 1000 Mg Tabs (Metformin hcl) .Marland Kitchen.. 1 by mouth two times a day 3)  Bayer Childrens Aspirin 81 Mg Chew (Aspirin) .... Take 1 tablet by mouth once a day 4)  Lasix 40 Mg Tabs (Furosemide) .Marland Kitchen.. 1 by mouth bid 5)  Lisinopril 40 Mg Tabs (Lisinopril) .Marland Kitchen.. 1 by mouth daily 6)  Norvasc 10 Mg Tabs (Amlodipine besylate) .... Take 1 tablet by mouth once a day 7)  Indomethacin 50 Mg Caps (Indomethacin) .Marland Kitchen.. 1 by mouth three times a day as needed gout flare 8)  Lipitor 40 Mg Tabs (Atorvastatin calcium) .... Take one by mouth daily 9)  Coreg 6.25 Mg Tabs (Carvedilol) .... Take one two times a day 10)  Tricor 145 Mg Tabs (Fenofibrate) .Marland Kitchen.. 1 by mouth daily 11)  Lamisil At Athletes Foot 1 % Crea (Terbinafine hcl) .... Apply to affected area four times daily x 2 weeks disp: qs 12)  Viagra 100 Mg Tabs (Sildenafil citrate) .Marland Kitchen.. 1 tab by mouth 30 minutes prior to sexual intercourse 13)  Actos 30 Mg Tabs (Pioglitazone hcl) .... Take one by mouth daily  Other Orders: Flu Vaccine 75yrs + (30160) Admin 1st Vaccine (10932) Prescriptions: ACTOS 30 MG TABS (PIOGLITAZONE HCL) take one by mouth daily  #30 x 3   Entered and Authorized by:   Ellery Plunk MD   Signed by:   Ellery Plunk MD on 09/26/2010   Method used:   Electronically to        [facility] (retail)       [address]       [place], [place]  [address]       Ph: [phone]       Fax: [phone]   RxID:   [id number] COREG 6.25 MG TABS (CARVEDILOL) take one two times a day  #60 x 11   Entered and  Authorized by:   Ellery Plunk MD   Signed by:   Ellery Plunk MD on 09/26/2010   Method used:   Electronically to        [facility].* (retail)  [address]       [place], [place]  [address]       Ph: [phone]       Fax: [phone]   RxID:   [id number]    Orders Added: 1)  A1C-[facility] [83036] 2)  2 D Echo [2 D Echo] 3)  Flu Vaccine 31yrs + [90658] 4)  Admin 1st Vaccine [90471] 5)  [facility]- Est  Level 4 [84132]   Immunizations Administered:  Influenza Vaccine:    Vaccine Type: Fluvax 3+    Site: right deltoid    Mfr: GlaxoSmithKline    Dose: 0.5 ml    Route: IM    Given by: Jone Baseman CMA    Exp. Date: 04/28/2011    Lot #: [id number]    VIS given: 05/23/10 version given September 26, 2010.   Immunizations Administered:  Influenza Vaccine:    Vaccine Type: Fluvax 3+    Site: right deltoid    Mfr: GlaxoSmithKline    Dose: 0.5 ml    Route: IM    Given by: Jone Baseman CMA    Exp. Date: 04/28/2011    Lot #: [id number]    VIS given: 05/23/10 version given September 26, 2010.  Flu Vaccine Consent Questions:    Do you have a history of severe allergic reactions to this vaccine? no    Any prior history of allergic reactions to egg and/or gelatin? no    Do you have a sensitivity to the preservative Thimersol? no    Do you have a past history of Guillan-Barre Syndrome? no    Do you currently have an acute febrile illness? no    Have you ever had a severe reaction to latex? no    Vaccine information given and explained to patient? yes  Laboratory Results   Blood Tests   Date/Time Received: September 26, 2010 3:03 PM  Date/Time Reported: September 26, 2010 3:29 PM   HGBA1C: 12.9%   (Normal Range: Non-Diabetic - 3-6%   Control Diabetic - 6-8%)  Comments: .......test performed by........Marland Kitchen Garen Grams, LPN entered by Terese Door, CMA

## 2010-11-28 NOTE — Progress Notes (Signed)
Summary: Rx Req   Phone Note Refill Request Call back at Home Phone [phone] Message from:  Patient  Refills Requested: Medication #1:  LASIX 40 MG TABS 1 by mouth bid PT IS GOING TO BE OUT OF TOWN UNTIL JUNE AND WONDERING IF HE CAN GET HIS RX FILLED UNTIL HE GETS BACK.    Initial call taken by: Clydell Hakim,  February 09, 2010 2:25 PM  Follow-up for Phone Call        told me he is leaving for [place] & then on to [place]. needs his lasix refilled. workin at 8:30 tomorrow with his pcp Follow-up by: Golden Circle RN,  February 09, 2010 2:27 PM

## 2010-11-28 NOTE — Miscellaneous (Signed)
Summary: statin dose   Clinical Lists Changes rec'd message from the pharmacy that they were faxing info about his statin & are concerned about his dose. to pcp for action if any.Golden Circle RN  May 16, 2010 4:57 PM  pt has appt on Friday to discuss.  Ellery Plunk MD  May 17, 2010 1:40 PM

## 2010-11-28 NOTE — Assessment & Plan Note (Signed)
Summary: weight check/eo  weight today 361.7. patient will return in 2 weeks. Theresia Lo RN  June 01, 2010 9:02 AM  Nurse Visit   Allergies: No Known Drug Allergies  Orders Added: 1)  No Charge Patient Arrived (NCPA0) [NCPA0]

## 2010-11-28 NOTE — Assessment & Plan Note (Signed)
Summary: Antonio [name],df   Vital Signs:  Patient profile:   60 year old male Height:      71 inches Weight:      363 pounds BMI:     50.81 Temp:     98.6 degrees F oral Pulse rate:   97 / minute BP sitting:   134 / 84  (left arm) Cuff size:   large  Vitals Entered By: Garen Grams LPN (May 19, 2010 9:07 AM) CC: f/u Antonio, obesity Is Patient Diabetic? Yes Did you bring your meter with you today? No Pain Assessment Patient in pain? no        Diabetic Foot Exam Last Podiatry Exam Date: 05/15/2010  Foot Inspection  Diabetic Foot Care Education Patient educated on appropriate care of diabetic feet.   High Risk Feet? No Set Next Diabetic Foot Exam here: 08/17/2010   Primary Care Provider:  Ellery Plunk MD  CC:  f/u Antonio and obesity.  History of Present Illness: Pt presents with goal of "getting healthy".  He has put off coming to the doctor, had a daughter's wedding in [place].  He does not have a CBG meter at home and he has not been counting carbs.  He is taking his medication as prescribed.   reviewed Antonio history with patient- has lost enough weight to be off of medications in the past.  does not want to be on insulin now.  thinks his pancreas will just "give up".  has previously used insulin and understands how it works.  He has been to Antonio education 3 times in the past and says he "could teach the class".  Is willing to get CBG meter and check each morning.  understands that A1C is very high and that average CBG is mid 200s. reviewed weight hx with pt- has tried the rice diet in the past, intensive inpatient diet.  however, has not kept up with it.  wants a pill for appetite suppression.  his daughter in law is using one with some limited success.  is willing to try weight watchers on line.   >25 min spent in face to face counselling and developing a treatment plan for this patient.  Habits & Providers  Alcohol-Tobacco-Diet     Tobacco Status: never  Current Medications  (verified): 1)  Amaryl 4 Mg Tabs (Glimepiride) .... Take 1 Tablet By Mouth Twice A Day 2)  Metformin Hcl 1000 Mg Tabs (Metformin Hcl) .Marland Kitchen.. 1 By Mouth Two Times A Day 3)  Bayer Childrens Aspirin 81 Mg Chew (Aspirin) .... Take 1 Tablet By Mouth Once A Day 4)  Lasix 40 Mg Tabs (Furosemide) .Marland Kitchen.. 1 By Mouth Bid 5)  Lisinopril 40 Mg Tabs (Lisinopril) .Marland Kitchen.. 1 By Mouth Daily 6)  Norvasc 10 Mg Tabs (Amlodipine Besylate) .... Take 1 Tablet By Mouth Once A Day 7)  Indomethacin 50 Mg Caps (Indomethacin) .Marland Kitchen.. 1 By Mouth Three Times A Day As Needed Gout Flare 8)  Lipitor 40 Mg Tabs (Atorvastatin Calcium) .... Take One By Mouth Daily 9)  Coreg 3.125 Mg  Tabs (Carvedilol) .Marland Kitchen.. 1 By Mouth Twice Daily 10)  Tricor 145 Mg  Tabs (Fenofibrate) .Marland Kitchen.. 1 By Mouth Daily 11)  Lamisil At Athletes Foot 1 % Crea (Terbinafine Hcl) .... Apply To Affected Area Four Times Daily X 2 Weeks Disp: Qs 12)  Viagra 100 Mg Tabs (Sildenafil Citrate) .Marland Kitchen.. 1 Tab By Mouth 30 Minutes Prior To Sexual Intercourse  Allergies (verified): No Known Drug Allergies  Social History: retired  school teacher-travelling; no smoking; Hx of noncompliance (mainly financial reasons).  Review of Systems  The patient denies anorexia, fever, weight loss, vision loss, chest pain, syncope, headaches, and abdominal pain.    Physical Exam  General:  morbidly obese.  VS reviewed, BP mild elevation BMI 50 Psych:  Oriented X3.  mildly anxious.  seems motivated for change.  not depressed appearing   Impression & Recommendations:  Problem # 1:  DIABETES MELLITUS, TYPE II, UNCONTROLLED (ICD-250.02) Assessment Unchanged Pt's A1C is actually mildly improved at 11% down from 13.  gave pt script for one touch meter.  plan to see pt every 2 weeks. if in 3 months, pt has made significant progress with his A1C, will again hold off on insulin.  pt in agreement with plan. His updated medication list for this problem includes:    Amaryl 4 Mg Tabs (Glimepiride) .Marland Kitchen...  Take 1 tablet by mouth twice a day    Metformin Hcl 1000 Mg Tabs (Metformin hcl) .Marland Kitchen... 1 by mouth two times a day    Bayer Childrens Aspirin 81 Mg Chew (Aspirin) .Marland Kitchen... Take 1 tablet by mouth once a day    Lisinopril 40 Mg Tabs (Lisinopril) .Marland Kitchen... 1 by mouth daily  Orders: Comp Met-[facility] [id number]) Lipid-[facility] ([id number]) Glucose Cap-[facility] (29562) [facility]- Est  Level 4 ([address]86)  Problem # 2:  OBESITY, NOS (ICD-278.00) Assessment: Unchanged pt to return for weigh in every 2 weeks and see Dr. every 1 month.  he plans to use WeightWatchers online with his wife.  discussed slow weight loss and importance of physical activity. Orders: [facility]- Est  Level 4 (57846)  Problem # 3:  HYPERTENSION, BENIGN SYSTEMIC (ICD-401.1) Assessment: Unchanged continue current medications.  controled His updated medication list for this problem includes:    Lasix 40 Mg Tabs (Furosemide) .Marland Kitchen... 1 by mouth bid    Lisinopril 40 Mg Tabs (Lisinopril) .Marland Kitchen... 1 by mouth daily    Norvasc 10 Mg Tabs (Amlodipine besylate) .Marland Kitchen... Take 1 tablet by mouth once a day    Coreg 3.125 Mg Tabs (Carvedilol) .Marland Kitchen... 1 by mouth twice daily  Orders: [facility]- Est  Level 4 ([address]295)  Problem # 4:  HYPERLIPIDEMIA (ICD-272.4) Assessment: Improved lipid panel obtained today.  improved.  continue tricor due to history of TG >700.  change simvastatin to lipitor to avoid side effects. His updated medication list for this problem includes:    Lipitor 40 Mg Tabs (Atorvastatin calcium) .Marland Kitchen... Take one by mouth daily    Tricor 145 Mg Tabs (Fenofibrate) .Marland Kitchen... 1 by mouth daily  Orders: [facility]- Est  Level 4 (99214)  Complete Medication List: 1)  Amaryl 4 Mg Tabs (Glimepiride) .... Take 1 tablet by mouth twice a day 2)  Metformin Hcl 1000 Mg Tabs (Metformin hcl) .Marland Kitchen.. 1 by mouth two times a day 3)  Bayer Childrens Aspirin 81 Mg Chew (Aspirin) .... Take 1 tablet by mouth once a day 4)  Lasix 40 Mg Tabs (Furosemide) .Marland Kitchen.. 1 by mouth bid 5)  Lisinopril 40 Mg Tabs  (Lisinopril) .Marland Kitchen.. 1 by mouth daily 6)  Norvasc 10 Mg Tabs (Amlodipine besylate) .... Take 1 tablet by mouth once a day 7)  Indomethacin 50 Mg Caps (Indomethacin) .Marland Kitchen.. 1 by mouth three times a day as needed gout flare 8)  Lipitor 40 Mg Tabs (Atorvastatin calcium) .... Take one by mouth daily 9)  Coreg 3.125 Mg Tabs (Carvedilol) .Marland Kitchen.. 1 by mouth twice daily 10)  Tricor 145 Mg Tabs (Fenofibrate) .Marland Kitchen.. 1 by mouth daily  11)  Lamisil At Athletes Foot 1 % Crea (Terbinafine hcl) .... Apply to affected area four times daily x 2 weeks disp: qs 12)  Viagra 100 Mg Tabs (Sildenafil citrate) .Marland Kitchen.. 1 tab by mouth 30 minutes prior to sexual intercourse  Other Orders: A1C-[facility] (95621)  Patient Instructions: 1)  It was nice to meet you today. 2)  You should be proud that you came and you can start getting back on track. 3)  Diabetes-We can wait 3 months to start insulin IF you are willing to start making changes.  You have told me you will start weight watchers online and exercise AT LEAST 30 min a day. 4)  To help you stay on track, I would like you to come in every 2 weeks for a weight check with a nurse.  Every other visit, I would like you to see me. We can access how you are doing. 5)  Also, you need to check your blood sugars.  I want you to keep a log of your morning blood sugar and bring it to each appt with me. 6)  Cholesterol- today we are checking your cholesterol.  Because of the potential side effects of the zocor, we are switching to lipitor.  continue the tricor. Prescriptions: LIPITOR 40 MG TABS (ATORVASTATIN CALCIUM) take one by mouth daily  #30 x 3   Entered and Authorized by:   Ellery Plunk MD   Signed by:   Ellery Plunk MD on 05/19/2010   Method used:   Electronically to        [facility].* (retail)       [address]       [place], [place]  [address]       Ph: [phone]       Fax: [phone]   RxID:   [id number]   Laboratory Results   Blood  Tests   Date/Time Received: May 19, 2010 8:59 AM  Date/Time Reported: May 19, 2010 9:18 AM   HGBA1C: 11.7%   (Normal Range: Non-Diabetic - 3-6%   Control Diabetic - 6-8%)  Comments: ...............test performed by.................Marland KitchenGaren Grams, LPN .............entered by...........Marland KitchenBonnie A. Swazil[place], MLS (ASCP)cm      Prevention & Chronic Care Immunizations   Influenza vaccine: Fluvax 3+  (08/12/2009)   Influenza vaccine deferral: Not available  (05/19/2010)   Influenza vaccine due: 09/02/2008    Tetanus booster: 08/12/2009: Tdap   Tetanus booster due: 09/29/2007    Pneumococcal vaccine: Pneumovax  (08/24/2008)   Pneumococcal vaccine due: None  Colorectal Screening   Hemoccult: Done.  (11/29/2002)   Hemoccult due: 11/30/2003    Colonoscopy: Not documented   Colonoscopy action/deferral: GI Referral  (08/12/2009)  Other Screening   PSA: 0.14  (09/15/2008)   PSA action/deferral: Discussion deferred  (05/19/2010)   Smoking status: never  (05/19/2010)  Diabetes Mellitus   HgbA1C: 11.7  (05/19/2010)   Hemoglobin A1C due: 12/04/2007    Eye exam: Not documented   Diabetic eye exam action/deferral: Ophthalmology referral  (08/12/2009)    Foot exam: yes  (08/12/2009)   Foot exam action/deferral: Do today   High risk foot: No  (05/19/2010)   Foot care education: Done  (05/19/2010)   Foot exam due: 08/17/2010    Urine microalbumin/creatinine ratio: Not documented   Urine microalbumin action/deferral: Not indicated    Diabetes flowsheet reviewed?: Yes   Progress toward A1C goal: Improved    Stage of readiness to change (diabetes management):  Action  Lipids   Total Cholesterol: 184  (09/15/2008)   LDL: 114  (09/15/2008)   LDL Direct: 106  (09/03/2007)   HDL: 33  (09/15/2008)   Triglycerides: 183  (09/15/2008)    SGOT (AST): 18  (11/11/2009)   SGPT (ALT): 18  (11/11/2009) CMP ordered    Alkaline phosphatase: 91  (11/11/2009)   Total bilirubin: 0.4   (11/11/2009)    Lipid flowsheet reviewed?: Yes   Progress toward LDL goal: Improved    Stage of readiness to change (lipid management): Action  Hypertension   Last Blood Pressure: 134 / 84  (05/19/2010)   Serum creatinine: 1.16  (09/15/2008)   BMP action: Ordered   Serum potassium 4.2  (09/15/2008) CMP ordered     Hypertension flowsheet reviewed?: Yes   Progress toward BP goal: Unchanged    Stage of readiness to change (hypertension management): Action  Self-Management Support :   Personal Goals (by the next clinic visit) :     Personal A1C goal: 9  (05/19/2010)     Personal blood pressure goal: 140/90  (05/19/2010)     Personal LDL goal: 70  (08/12/2009)    Diabetes self-management support: Not documented    Hypertension self-management support: Not documented    Lipid self-management support: Not documented    Nursing Instructions: Diabetic foot exam today

## 2010-11-28 NOTE — Assessment & Plan Note (Signed)
Summary: needs lasix refilled/[place]   Vital Signs:  Patient profile:   60 year old male Height:      71 inches Weight:      363 pounds BMI:     50.81 BSA:     2.72 Temp:     98.6 degrees F Pulse rate:   94 / minute BP sitting:   136 / 93  Vitals Entered By: Jone Baseman CMA (February 10, 2010 9:04 AM) CC: needs lasix refilled Is Patient Diabetic? Yes Did you bring your meter with you today? No Pain Assessment Patient in pain? no        Primary Care Provider:  Romero Belling MD  CC:  needs lasix refilled.  History of Present Illness: Here for brief visit for Lasix refill prior to going to [place] for daughter's wedding.  Asked him to come in for A1c check.  Kept visit brief as he is preparing for long trip now.  Back pain (no LE weakness) gets worse on flight, asked for non-narcotic pain relief.  Habits & Providers  Alcohol-Tobacco-Diet     Tobacco Status: never  Allergies (verified): No Known Drug Allergies  Physical Exam  General:  Well-developed,well-nourished,in no acute distress; alert,appropriate and cooperative throughout examination   Impression & Recommendations:  Problem # 1:  DIABETES MELLITUS II, UNCOMPLICATED (ICD-250.00) Assessment Unchanged  Extremely poor control.  Discussed insulin last visit but he didn't follow up.  Brief visit today for refills, but urged him to make an appointment after his trip to discuss insulin, diet, exercise.  He is trying to exercise more. His updated medication list for this problem includes:    Amaryl 4 Mg Tabs (Glimepiride) .Marland Kitchen... Take 1 tablet by mouth twice a day    Metformin Hcl 1000 Mg Tabs (Metformin hcl) .Marland Kitchen... 1 by mouth two times a day    Bayer Childrens Aspirin 81 Mg Chew (Aspirin) .Marland Kitchen... Take 1 tablet by mouth once a day    Lisinopril 40 Mg Tabs (Lisinopril) .Marland Kitchen... 1 by mouth daily  Labs Reviewed: Creat: 1.16 (09/15/2008)    Reviewed HgBA1c results: 12.7 (02/10/2010)  13.4 (08/12/2009)  Orders: A1C-[facility]  ([address]) [facility]- Est Level  2 (60454)  Problem # 2:  BACK PAIN, LUMBAR (ICD-724.2) Cyclobenzaprine limited supply to relieve back pain during long flights and while travelling.  Warned about somnolence side effect, patient has taken before.    Cyclobenzaprine Hcl 10 Mg Tabs (Cyclobenzaprine hcl) .Marland Kitchen... 1 tab by mouth three times a day as needed for back pain.  do not drive or use machinery while taking.  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  Warned that foot infections with poorly controlled diabetes are dangerous and that he is at risk for amputation.  Complete Medication List: 1)  Amaryl 4 Mg Tabs (Glimepiride) .... Take 1 tablet by mouth twice a day 2)  Metformin Hcl 1000 Mg Tabs (Metformin hcl) .Marland Kitchen.. 1 by mouth two times a day 3)  Bayer Childrens Aspirin 81 Mg Chew (Aspirin) .... Take 1 tablet by mouth once a day 4)  Lasix 40 Mg Tabs (Furosemide) .Marland Kitchen.. 1 by mouth bid 5)  Lisinopril 40 Mg Tabs (Lisinopril) .Marland Kitchen.. 1 by mouth daily 6)  Norvasc 10 Mg Tabs (Amlodipine besylate) .... Take 1 tablet by mouth once a day 7)  Indomethacin 50 Mg Caps (Indomethacin) .Marland Kitchen.. 1 by mouth three times a day as needed gout flare 8)  Zocor 80 Mg Tabs (Simvastatin) .Marland KitchenMarland KitchenMarland Kitchen 1  by mouth daily 9)  Coreg 3.125 Mg Tabs (Carvedilol) .Marland Kitchen.. 1 by mouth twice daily 10)  Tricor 145 Mg Tabs (Fenofibrate) .Marland Kitchen.. 1 by mouth daily 11)  Lamisil At Athletes Foot 1 % Crea (Terbinafine hcl) .... Apply to affected area four times daily x 2 weeks disp: qs 12)  Viagra 100 Mg Tabs (Sildenafil citrate) .Marland Kitchen.. 1 tab by mouth 30 minutes prior to sexual intercourse 13)  Cyclobenzaprine Hcl 10 Mg Tabs (Cyclobenzaprine hcl) .Marland Kitchen.. 1 tab by mouth three times a day as needed for back pain.  do not drive or use machinery while taking. Prescriptions: LASIX 40 MG TABS (FUROSEMIDE) 1 by mouth bid  #60 x 3   Entered and Authorized by:   Romero Belling MD   Signed by:   Romero Belling  MD on 02/10/2010   Method used:   Electronically to        [facility].* (retail)       [address]       [place], [place]  [address]       Ph: [phone]       Fax: [phone]   RxID:   [id number] CYCLOBENZAPRINE HCL 10 MG TABS (CYCLOBENZAPRINE HCL) 1 tab by mouth three times a day as needed for back pain.  Do not drive or use machinery while taking.  #30 x 0   Entered and Authorized by:   Romero Belling MD   Signed by:   Romero Belling MD on 02/10/2010   Method used:   Electronically to        [facility].* (retail)       [address]       [place], [place]  [address]       Ph: [phone]       Fax: [phone]   RxID:   [id number]   Laboratory Results   Blood Tests   Date/Time Received: February 10, 2010 9:01 AM  Date/Time Reported: February 10, 2010 9:18 AM   HGBA1C: 12.7%   (Normal Range: Non-Diabetic - 3-6%   Control Diabetic - 6-8%)  Comments: ...............test performed by......Marland KitchenBonnie A. Swazil[place], MLS (ASCP)cm

## 2010-11-28 NOTE — Letter (Signed)
Summary: Generic Letter  [facility]  [address]   [place], [place] [address]   Phone: [phone]  Fax: [phone]    05/22/2010  [facility] [address] [place], [place]  [address]  Dear Mr. CELI,   Here are the full results of your cholesterol test.    1. Note that your triglycerides are still elevated at 254, but that is down from previously when they have been as high as 700.  The best way to continue to get that number down is through striving toward better control of your diabetes. The tricor is the medicine you are taking for this.  2.Your HDL is slightly low at 35.  This is the good cholesterol that is protective.  Exercise and weight loss will help bring this up a little.  3.Your LDL is above your goal of 70.  This is the bad cholesterol.  The lipitor helps with this as does the changes you are making to your diet.     Cholesterol               195 mg/dL                   5-784     ATP III Classification:           < 200        mg/dL        Desirable          200 - 239     mg/dL        Borderline High          >= 240        mg/dL        High         Triglyceride         [H]  254 mg/dL                   <696   HDL Cholesterol      [L]  35 mg/dL                    >29   Total Chol/HDL Ratio      5.6 Ratio  VLDL Cholesterol (Calc)                        [H]  51 mg/dL                    5-28  LDL Cholesterol (Calc)                        [H]  109 mg/dL                   4-13           Total Cholesterol/HDL Ratio:CHD Risk                            Coronary Heart Disease Risk Table                                            Men       Women              1/2 Average Risk  3.4        3.3                  Average Risk              5.0        4.4              2 X Average Risk              9.6        7.1              3 X Average Risk             23.4       11.0     Use the calculated Patient Ratio above and the CHD Risk table        to determine the patient's CHD Risk.     ATP III Classification (LDL):           < 100        mg/dL         Optimal          100 - 129     mg/dL         Near or Above Optimal          130 - 159     mg/dL         Borderline High          160 - 189     mg/dL         High           > 190        mg/dL         Very High  Your tests for liver and kidney both look good.  The lab pertaining to kidneys is Creatinine.  Yours is normal range.  The liver tests are Alkaline Phosphatase, AST, and ALT.  THese are normal range.   Sodium                    135 mEq/L                   135-145   Potassium                 4.6 mEq/L                   3.5-5.3   Chloride                  98 mEq/L                    96-112   CO2                       27 mEq/L                    19-32   Glucose              [H]  293 mg/dL                   16-10   BUN                       22 mg/dL                    9-60  Creatinine                0.96 mg/dL                  0.40-1.50   Bilirubin, Total          0.4 mg/dL                   0.9-8.1   Alkaline Phosphatase      77 U/L                      39-117   AST/SGOT                  13 U/L                      0-37   ALT/SGPT                  15 U/L                      0-53   Total Protein             7.4 g/dL                    1.9-1.4   Albumin                   4.1 g/dL                    7.8-2.9   Calcium                   9.4 mg/dL                   5.6-21.3   If you have any questions, I would be happy to discuss this at your next appt or over the phone.   Sincerely,   Ellery Plunk MD   Appended Document: Generic Letter mailed

## 2010-11-28 NOTE — Letter (Signed)
Summary: Diabetes Letter  [facility]  [address]   [place], [place] [address]   Phone: [phone]  Fax: [phone]    11/24/2009  [facility] [address] [place], [place]  [address]  Dear Mr. CELI,  On review of your medical record, I see that you are past due for a diabetes check.  At our last visit in October, your diabetes was extremely poorly controlled, and I am very concerned for your future health.  Please schedule a visit with me as soon as possible.  Sincerely, Romero Belling MD  Appended Document: Diabetes Letter mailed.

## 2010-11-30 NOTE — Progress Notes (Signed)
Summary: Nuclear pre procedure  Phone Note Outgoing Call Call back at Home Phone [phone]   Call placed by: [employer], CMA,  November 01, 2010 3:50 PM Call placed to: Patient Summary of Call: Left message with information on [employer] Information Sheet (see scanned document for details).      Nuclear Med Background Indications for Stress Test: Evaluation for Ischemia   History: Echo  History Comments: 12/11 Echo:EF=45-50%  Symptoms: DOE, SOB    Nuclear Pre-Procedure Cardiac Risk Factors: Family History - CAD, Hypertension, Lipids, NIDDM, Obesity Height (in): 71

## 2010-11-30 NOTE — Assessment & Plan Note (Signed)
Summary: rov/discuss myoview/dm  Medications Added LASIX 40 MG TABS (FUROSEMIDE) 1 by mouth once daily LANTUS 100 UNIT/ML SOLN (INSULIN GLARGINE) take 12 units once at night        Primary Provider:  Ellery Plunk MD  CC:  Occassional SOB and feet swelling.  History of Present Illness: 60 yo white male with history of moderate aortic valve stenosis,  DM, HTN, hyperlipidemia and obesity who is here today for cardiac follow up. I saw him as a new patient on 10/16/10. He has been known to have mild to moderate aortic stenosis for many years. Recently he had a repeat echo that showed moderate to severe aortic valve stenosis. He tells me that he has been having exertional dyspnea but no signficant change over the last few years. He has had no chest pain, dizziness, near syncope, syncope or signs of heart failure. I ordered a Lexiscan myoview to exclude ischemia given his cardiomyopathy and dyspnea. His myoview showed inferolateral scar with peri-infarct ischemia. His EF was 34% on the myoview. He has had no change in his symptoms. He was started on insulin since his last visit in our office.   Current Medications (verified): 1)  Amaryl 4 Mg Tabs (Glimepiride) .... Take 1 Tablet By Mouth Twice A Day 2)  Metformin Hcl 1000 Mg Tabs (Metformin Hcl) .Marland Kitchen.. 1 By Mouth Two Times A Day 3)  Aspirin 81 Mg Tbec (Aspirin) .... Take One Tablet By Mouth Daily 4)  Lasix 40 Mg Tabs (Furosemide) .Marland Kitchen.. 1 By Mouth Once Daily 5)  Lisinopril 40 Mg Tabs (Lisinopril) .Marland Kitchen.. 1 By Mouth Daily 6)  Norvasc 10 Mg Tabs (Amlodipine Besylate) .... Take 1 Tablet By Mouth Once A Day 7)  Indomethacin 50 Mg Caps (Indomethacin) .Marland Kitchen.. 1 By Mouth Three Times A Day As Needed Gout Flare 8)  Lipitor 40 Mg Tabs (Atorvastatin Calcium) .... Take One By Mouth Daily 9)  Coreg 6.25 Mg Tabs (Carvedilol) .... Take One Two Times A Day 10)  Tricor 145 Mg  Tabs (Fenofibrate) .Marland Kitchen.. 1 By Mouth Daily 11)  Lamisil At Athletes Foot 1 % Crea (Terbinafine  Hcl) .... Apply To Affected Area Four Times Daily X 2 Weeks Disp: Qs 12)  Viagra 100 Mg Tabs (Sildenafil Citrate) .Marland Kitchen.. 1 Tab By Mouth 30 Minutes Prior To Sexual Intercourse 13)  Lantus 100 Unit/ml Soln (Insulin Glargine) .... Take 12 Units Once At Night  Allergies: 1)  ! Percodan  Past History:  Past Medical History: Reviewed history from 10/16/2010 and no changes required. Aortic stenosis-moderate to severe by echo 12/11 HTN 6/00--EF 63% by nuclear study Hepatitis in 20's, resolved Bacterial meningitis Pneumonia Hyperlipidemia Legally blind OD (amblyopia as child),  MRSA thigh abscess 11/06,  Anxiety Has used `Rice diet` at times to lose weight DM  Past Surgical History: Reviewed history from 10/16/2010 and no changes required. Laminectomy - 10/30/1983  Family History: Reviewed history from 10/16/2010 and no changes required. Father deceased < 60yo - MI, CHF  Mom died 2/2  breast cancer age 54 1 sister-younger, healthy  Social History: Reviewed history from 10/16/2010 and no changes required. 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  The patient denies fatigue, malaise, fever, weight gain/loss, vision loss, decreased hearing, hoarseness, chest pain, palpitations, prolonged cough, wheezing, sleep apnea, coughing up blood, abdominal pain, blood in stool, nausea, vomiting, diarrhea, heartburn, incontinence, blood  in urine, muscle weakness, joint pain, leg swelling, rash, skin lesions, headache, fainting, dizziness, depression, anxiety, enlarged lymph nodes, easy bruising or bleeding, and environmental allergies.    Vital Signs:  Patient profile:   60 year old male Height:      71 inches Weight:      350.25 pounds Pulse rate:   78 / minute BP sitting:   118 / 78  (left arm) Cuff size:   large  Vitals Entered By: Haze Boyden, CMA (November 15, 2010 4:23 PM)  Physical Exam  General:  General: Well developed, well nourished, NAD HEENT: OP clear, mucus membranes moist SKIN: warm, dry Neuro: No focal deficits Musculoskeletal: Muscle strength 5/5 all ext Psychiatric: Mood and affect normal Neck: No JVD, no carotid bruits, no thyromegaly, no lymphadenopathy. Lungs:Clear bilaterally, no wheezes, rhonci, crackles CV: RRR with loud 3/6 systolic murmur at the RUSB, No gallops rubs Abdomen: soft, NT, ND, BS present Extremities: Trace bilateral lower ext  edema, pulses 1-2+.    Nuclear Study  Procedure date:  11/02/2010  Findings:      Stress Procedure   The patient received IV Lexiscan 0.4 mg over 15-seconds.  Technetium 54m Tetrofosmin injected at 30-seconds.  There were no significant changes with lexiscan,frequent PVC's. The patient had a hypotensive response after lexiscan that subsided quickly.  Quantitative spect images were obtained after a 45 minute delay.  QPS  Raw Data Images:  There is no interference from other nuclear activity. Stress Images:  There is decreased uptake in the inferolateral wall Rest Images:  There is decreased uptake in the inferolateral wall, less prominent compared to the stress images. Subtraction (SDS):  These findings are consistent with prior inferolateral infarct and mild peri-infarct ischemia. Transient Ischemic Dilatation:  1.16  (Normal <1.22)  Lung/Heart Ratio:  .41  (Normal <0.45)  Quantitative Gated Spect Images  QGS EDV:  203 ml QGS ESV:  134 ml QGS EF:  34 % QGS cine images:  Hypokinesis of the inferobasal wall; LV function appears better than calculated EF; suggest echo to better assess; LVE.   Overall Impression   Exercise Capacity: Lexiscan with no exercise. BP Response: Normal blood pressure response. Clinical Symptoms: No chest pain ECG Impression: No significant ST segment change suggestive of ischemia. Overall Impression: Abnormal lexiscan nuclear study with prior  inferolateral infarct and mild peri-infarct ischemia.  Impression & Recommendations:  Problem # 1:  AORTIC STENOSIS (ICD-424.1) Moderate to severe aortic stenosis. Will assess with right heart cath/left heart cath.   His updated medication list for this problem includes:    Lasix 40 Mg Tabs (Furosemide) .Marland Kitchen... 1 by mouth once daily    Lisinopril 40 Mg Tabs (Lisinopril) .Marland Kitchen... 1 by mouth daily    Coreg 6.25 Mg Tabs (Carvedilol) .Marland Kitchen... Take one two times a day  Problem # 2:  ABNORMAL CV (STRESS) TEST (ICD-794.39) Will arrange left heart cath to rule out CAD. Risks and benefits reviewed with pt. Will check pre cath labs tomorrow.   Orders: Cardiac Catheterization (Cardiac Cath)  Problem # 3:  CARDIOMYOPATHY, SECONDARY (ICD-425.9) ? Etiology. Right and left heart cath to assess.   His updated medication list for this problem includes:    Aspirin 81 Mg Tbec (Aspirin) .Marland Kitchen... Take one tablet by mouth daily    Lasix 40 Mg Tabs (Furosemide) .Marland Kitchen... 1 by mouth once daily    Lisinopril 40 Mg Tabs (Lisinopril) .Marland Kitchen... 1 by mouth daily    Norvasc 10 Mg Tabs (Amlodipine besylate) .Marland Kitchen... Take  1 tablet by mouth once a day    Coreg 6.25 Mg Tabs (Carvedilol) .Marland Kitchen... Take one two times a day  Patient Instructions: 1)  Your physician recommends that you schedule a follow-up appointment in: 3-4 weeks. 2)  Your physician recommends that you return for lab work tomorrow any time after 8:30am. 3)  Your physician recommends that you continue on your current medications as directed. Please refer to the Current Medication list given to you today. 4)  Your physician has requested that you have a cardiac catheterization scheduled for 11/21/10 @ 11am. Cardiac catheterization is used to diagnose and/or treat various heart conditions. Doctors may recommend this procedure for a number of different reasons. The most common reason is to evaluate chest pain. Chest pain can be a symptom of coronary artery disease (CAD), and cardiac  catheterization can show whether plaque is narrowing or blocking your heart's arteries. This procedure is also used to evaluate the valves, as well as measure the blood flow and oxygen levels in different parts of your heart.  For further information please visit [email or web address]  Please follow instruction sheet, as given. 5)  Y

## 2010-11-30 NOTE — Assessment & Plan Note (Signed)
Summary: Cardiology Nuclear Testing  Nuclear Med Background Indications for Stress Test: Evaluation for Ischemia   History: Echo, Myocardial Perfusion Study  History Comments: MPS>10 yrs ago: Ok per patient. 12/11 Echo:EF=45-50%  Symptoms: DOE, Fatigue, Fatigue with Exertion, SOB    Nuclear Pre-Procedure Cardiac Risk Factors: Family History - CAD, Hypertension, Lipids, NIDDM, Obesity Caffeine/Decaff Intake: None NPO After: 12:00 AM Lungs: Clear IV 0.9% NS with Angio Cath: 22g     IV Site: R Antecubital IV Started by: Irean Hong, RN Chest Size (in) 60     Height (in): 71 Weight (lb): 347 BMI: 48.57 Tech Comments: Held coreg this am.  Nuclear Med Study 1 or 2 day study:  2 day     Stress Test Type:  Lexiscan Reading MD:  Olga Millers, MD     Referring MD:  Alyson Ingles Resting Radionuclide:  Technetium 33m Tetrofosmin     Resting Radionuclide Dose:  33 mCi  Stress Radionuclide:  Technetium 34m Tetrofosmin     Stress Radionuclide Dose:  33 mCi   Stress Protocol      Max HR:  102 bpm Max Systolic BP: 115 mm HgRate Pressure Product:  04540  Lexiscan: 0.4 mg   Stress Test Technologist:  Irean Hong,  RN     Nuclear Technologist:  Domenic Polite, CNMT  Rest Procedure  Myocardial perfusion imaging was performed at rest 45 minutes following the intravenous administration of Technetium 81m Tetrofosmin.  Stress Procedure  The patient received IV Lexiscan 0.4 mg over 15-seconds.  Technetium 3m Tetrofosmin injected at 30-seconds.  There were no significant changes with lexiscan,frequent PVC's. The patient had a hypotensive response after lexiscan that subsided quickly.  Quantitative spect images were obtained after a 45 minute delay.  QPS Raw Data Images:  There is no interference from other nuclear activity. Stress Images:  There is decreased uptake in the inferolateral wall Rest Images:  There is decreased uptake in the inferolateral wall, less prominent compared to the  stress images. Subtraction (SDS):  These findings are consistent with prior inferolateral infarct and mild peri-infarct ischemia. Transient Ischemic Dilatation:  1.16  (Normal <1.22)  Lung/Heart Ratio:  .41  (Normal <0.45)  Quantitative Gated Spect Images QGS EDV:  203 ml QGS ESV:  134 ml QGS EF:  34 % QGS cine images:  Hypokinesis of the inferobasal wall; LV function appears better than calculated EF; suggest echo to better assess; LVE.   Overall Impression  Exercise Capacity: Lexiscan with no exercise. BP Response: Normal blood pressure response. Clinical Symptoms: No chest pain ECG Impression: No significant ST segment change suggestive of ischemia. Overall Impression: Abnormal lexiscan nuclear study with prior inferolateral infarct and mild peri-infarct ischemia.  Appended Document: Cardiology Nuclear Testing Abnormal stress test. Can we get him in for an appt? I think we arranged 6 month f/u but I should see him back sometime this month. thanks, chris  Appended Document: Cardiology Nuclear Testing pt aware of results

## 2010-11-30 NOTE — Letter (Signed)
Summary: Cardiac Catheterization Instructions- [facility]  [employer], Main Office  [address]   [place], [place] [address]   Phone: [phone]  Fax: [phone]     11/15/2010 MRN: [id number]  [facility] [address] [place], [place]  [address]  Dear Mr. CELI,   You are scheduled for a Cardiac Catheterization on 11/21/10 with Dr.McAlhany.  Please arrive to the 1st floor of the [facility] at [facility] at 10:00 am  on the day of your procedure. Please do not arrive before 6:30 a.m. Call the Heart and Vascular Center at [phone] if you are unable to make your appointmnet. The Code to get into the parking garage under the building is 0030. Take the elevators to the 1st floor. You must have someone to drive you home. Someone must be with you for the first 24 hours after you arrive home. Please wear clothes that are easy to get on and off and wear slip-on shoes. Do not eat or drink after midnight except water with your medications that morning. Bring all your medications and current insurance cards with you.  __x_ DO NOT take these medications before your procedure: _Hold your Metformin the day before the procedure and 48 hours after the procedure. - Take 1/2 of your Lantus the night before your procedure.  __x_ Make sure you take your aspirin.  _x__ You may take the rest of your medications with water that morning.  The usual length of stay after your procedure is 2 to 3 hours. This can vary.  If you have any questions, please call the office at the number listed above.   Whitney Maeola Sarah RN

## 2010-11-30 NOTE — Assessment & Plan Note (Signed)
Summary: np6/aortic stynosis/lg  Medications Added ASPIRIN 81 MG TBEC (ASPIRIN) Take one tablet by mouth daily      Allergies Added: ! PERCODAN  Visit Type:  new pt visit Primary Provider:  Ellery Plunk MD  CC:  sob w/walking at times.....edema/feet/legs....right hand tingling at times....denies any cp.  History of Present Illness: 60 yo white male with history of moderate aortic valve stenosis,  DM, HTN, hyperlipidemia and obesity who is here today to establish cardiac care. He has been known to have mild to moderate aortic stenosis for many years. Recently he had a repeat echo that showed moderate to severe aortic valve stenosis. He tells me that he has been having exertional dyspnea but no signficant change over the last few years. He has had no chest pain, dizziness, near syncope, syncope or signs of heart failure. He has multiple risk factors for CAD with normal stress test ten years ago.   Current Medications (verified): 1)  Amaryl 4 Mg Tabs (Glimepiride) .... Take 1 Tablet By Mouth Twice A Day 2)  Metformin Hcl 1000 Mg Tabs (Metformin Hcl) .Marland Kitchen.. 1 By Mouth Two Times A Day 3)  Aspirin 81 Mg Tbec (Aspirin) .... Take One Tablet By Mouth Daily 4)  Lasix 40 Mg Tabs (Furosemide) .Marland Kitchen.. 1 By Mouth Bid 5)  Lisinopril 40 Mg Tabs (Lisinopril) .Marland Kitchen.. 1 By Mouth Daily 6)  Norvasc 10 Mg Tabs (Amlodipine Besylate) .... Take 1 Tablet By Mouth Once A Day 7)  Indomethacin 50 Mg Caps (Indomethacin) .Marland Kitchen.. 1 By Mouth Three Times A Day As Needed Gout Flare 8)  Lipitor 40 Mg Tabs (Atorvastatin Calcium) .... Take One By Mouth Daily 9)  Coreg 6.25 Mg Tabs (Carvedilol) .... Take One Two Times A Day 10)  Tricor 145 Mg  Tabs (Fenofibrate) .Marland Kitchen.. 1 By Mouth Daily 11)  Lamisil At Athletes Foot 1 % Crea (Terbinafine Hcl) .... Apply To Affected Area Four Times Daily X 2 Weeks Disp: Qs 12)  Viagra 100 Mg Tabs (Sildenafil Citrate) .Marland Kitchen.. 1 Tab By Mouth 30 Minutes Prior To Sexual Intercourse 13)  Actos 30 Mg Tabs  (Pioglitazone Hcl) .... Take One By Mouth Daily  Allergies (verified): 1)  ! Percodan  Past History:  Past Medical History: Aortic stenosis-moderate to severe by echo 12/11 HTN 6/00--EF 63% by nuclear study Hepatitis in 20's, resolved Bacterial meningitis Pneumonia Hyperlipidemia Legally blind OD (amblyopia as child),  MRSA thigh abscess 11/06,  Anxiety Has used `Rice diet` at times to lose weight DM  Past Surgical History: Laminectomy - 10/30/1983  Family History: Reviewed history from 12/26/2006 and no changes required. Father deceased < 43yo - MI, CHF  Mom died 2/2  breast cancer age 61 1 sister-younger, healthy  Social History: Reviewed history from 05/19/2010 and no changes required. 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  The patient denies fatigue, malaise, fever, weight gain/loss, vision loss, decreased hearing, hoarseness, chest pain, palpitations, prolonged cough, wheezing, sleep apnea, coughing up blood, abdominal pain, blood in stool, nausea, vomiting, diarrhea, heartburn, incontinence, blood in urine, muscle weakness, joint pain, leg swelling, rash, skin lesions, headache, fainting, dizziness, depression, anxiety, enlarged lymph nodes, easy bruising or bleeding, and environmental allergies.    Vital Signs:  Patient profile:   60 year old male Height:      71 inches Weight:      352.25 pounds BMI:  49.31 Pulse rate:   90 / minute Pulse rhythm:   irregular BP sitting:   122 / 62  (left arm) Cuff size:   large  Vitals Entered By: Danielle Rankin, CMA (October 16, 2010 3:01 PM)  Physical Exam  General:  General: Well developed, well nourished, NAD HEENT: OP clear, mucus membranes moist SKIN: warm, dry Neuro: No focal deficits Musculoskeletal: Muscle strength 5/5 all ext Psychiatric: Mood and affect normal Neck: No JVD, no  carotid bruits, no thyromegaly, no lymphadenopathy. Lungs:Clear bilaterally, no wheezes, rhonci, crackles CV: RRR with loud 3/6 systolic murmur at the RUSB, No gallops rubs Abdomen: soft, NT, ND, BS present Extremities: Trace bilateral lower ext  edema, pulses 1-2+.    Echocardiogram  Procedure date:  10/10/2010  Findings:      Left ventricle: The cavity size was mildly dilated. Systolic       function was mildly reduced. The estimated ejection fraction was       in the range of 45% to 50%. Images were inadequate for LV wall       motion assessment. Doppler parameters are consistent with abnormal       left ventricular relaxation (grade 1 diastolic dysfunction).     - Aortic valve: Mildly calcified annulus. Trileaflet; moderately       calcified leaflets. Valve mobility was moderately restricted.       There was moderate to severe stenosis. Trivial regurgitation.       Valve area: 0.93cm 2(VTI). Valve area: 0.87cm 2 (Vmax).     - Mitral valve: Calcified annulus. Mild regurgitation.     - Left atrium: The atrium was mildly to moderately dilated.  EKG  Procedure date:  10/16/2010  Findings:      NSR, rate 90 bpm. Poor R wave progression through precordial leads. LAFB  Impression & Recommendations:  Problem # 1:  DYSPNEA (ICD-786.05) With his obesity, HTN, DM, HL and FH of CAD, will arrange Lexiscan stress myoview to rule out ischemia.  LV function is low normal with no major change since 2006 echo.   His updated medication list for this problem includes:    Aspirin 81 Mg Tbec (Aspirin) .Marland Kitchen... Take one tablet by mouth daily    Lasix 40 Mg Tabs (Furosemide) .Marland Kitchen... 1 by mouth bid    Lisinopril 40 Mg Tabs (Lisinopril) .Marland Kitchen... 1 by mouth daily    Norvasc 10 Mg Tabs (Amlodipine besylate) .Marland Kitchen... Take 1 tablet by mouth once a day    Coreg 6.25 Mg Tabs (Carvedilol) .Marland Kitchen... Take one two times a day  Orders: Nuclear Stress Test (Nuc Stress Test)  Problem # 2:  AORTIC STENOSIS  (ICD-424.1) Moderate to severe AS but no clinical signs. For now, will monitor. Will repeat echo in 6 months and reassess at that time.   His updated medication list for this problem includes:    Lasix 40 Mg Tabs (Furosemide) .Marland Kitchen... 1 by mouth bid    Lisinopril 40 Mg Tabs (Lisinopril) .Marland Kitchen... 1 by mouth daily    Coreg 6.25 Mg Tabs (Carvedilol) .Marland Kitchen... Take one two times a day  Orders: EKG w/ Interpretation (93000) Nuclear Stress Test (Nuc Stress Test) Echocardiogram (Echo)  Patient Instructions: 1)  Your physician recommends that you schedule a follow-up appointment in: 6 months. 2)  Your physician recommends that you continue on your current medications as directed. Please refer to the Current Medication list given to you today. 3)  Your physician has requested that you have an echocardiogram in 6 months.  Echocardiography is a painless test that uses sound waves to create images of your heart. It provides your doctor with information about the size and shape of your heart and how well your heart's chambers and valves are working.  This procedure takes approximately one hour. There are no restrictions for this procedure. 4)  Your physician has requested that you have an lexiscan myoview.  For further information please visit [email or web address]  Please follow instruction sheet, as given.

## 2010-11-30 NOTE — Progress Notes (Signed)
   Phone Note Outgoing Call   Call placed by: Ellery Plunk MD,  October 12, 2010 9:40 PM Summary of Call: explained results of echo.  pt will go to cardiologist for closer monitoring.  will send in referral Initial call taken by: Ellery Plunk MD,  October 12, 2010 9:41 PM

## 2010-12-08 ENCOUNTER — Encounter: Payer: Self-pay | Admitting: Cardiovascular Disease

## 2010-12-11 ENCOUNTER — Encounter: Payer: Self-pay | Admitting: Cardiovascular Disease

## 2010-12-11 ENCOUNTER — Ambulatory Visit (INDEPENDENT_AMBULATORY_CARE_PROVIDER_SITE_OTHER): Payer: BC Managed Care – PPO | Admitting: Cardiovascular Disease

## 2010-12-11 DIAGNOSIS — I428 Other cardiomyopathies: Secondary | ICD-10-CM

## 2010-12-11 DIAGNOSIS — R079 Chest pain, unspecified: Secondary | ICD-10-CM

## 2010-12-11 DIAGNOSIS — R9439 Abnormal result of other cardiovascular function study: Secondary | ICD-10-CM

## 2010-12-14 NOTE — Miscellaneous (Signed)
  Clinical Lists Changes  Observations: Added new observation of CARDCATHFIND: IMPRESSION: 1. No angiographic evidence of coronary artery disease. 2. Moderate left ventricular systolic dysfunction secondary to     nonischemic cardiomyopathy. 3. Moderately severe aortic valve stenosis.   RECOMMENDATIONS:  This patient has moderate-to-severe aortic valve stenosis, but currently has minimal symptoms.  I cannot fully explain his degree of LV systolic dysfunction based on his valvular heart disease.  We will follow him closely.  He will ultimately need aortic valve replacement.  We plan on repeating an echocardiogram in 6 months. If there is any progression of his disease or any further changes in his LV systolic function then we will consider moving towards the aortic valve replacement.     (11/21/2010 16:12)      Cardiac Cath  Procedure date:  11/21/2010  Findings:      IMPRESSION: 1. No angiographic evidence of coronary artery disease. 2. Moderate left ventricular systolic dysfunction secondary to     nonischemic cardiomyopathy. 3. Moderately severe aortic valve stenosis.   RECOMMENDATIONS:  This patient has moderate-to-severe aortic valve stenosis, but currently has minimal symptoms.  I cannot fully explain his degree of LV systolic dysfunction based on his valvular heart disease.  We will follow him closely.  He will ultimately need aortic valve replacement.  We plan on repeating an echocardiogram in 6 months. If there is any progression of his disease or any further changes in his LV systolic function then we will consider moving towards the aortic valve replacement.

## 2010-12-20 NOTE — Assessment & Plan Note (Signed)
Summary: ROV/WPA/AC    Primary Provider:  Leonette Most   CC:  check up.  History of Present Illness: 60 yo white male with history of moderate aortic valve stenosis,  DM, HTN, hyperlipidemia and obesity who is here today for cardiac follow up. I saw him as a new patient on 10/16/10. He has been known to have mild to moderate aortic stenosis for many years. Recent  echo showed moderate to severe aortic valve stenosis. He told me that he has been having exertional dyspnea but no signficant change over the last few years. He has had no chest pain, dizziness, near syncope, syncope or signs of heart failure. I ordered a Lexiscan myoview to exclude ischemia given his cardiomyopathy and dyspnea. His myoview showed inferolateral scar with peri-infarct ischemia. His EF was 34% on the myoview.At the last visit, I arranged a diagnostic left heart cath. He had no evidence of CAD. His calculated aortic valve area was 1.0 cm2 with peak to peak gradient of 31 mm Hg across the aortic valve. LVEF was 35-40%.   He is here today for follow up. He has been doing well. No chest pain, SOB, dizziness, near syncope or syncope. He is trying to lose weight.    Current Medications (verified): 1)  Amaryl 4 Mg Tabs (Glimepiride) .... Take 1 Tablet By Mouth Twice A Day 2)  Metformin Hcl 1000 Mg Tabs (Metformin Hcl) .Marland Kitchen.. 1 By Mouth Two Times A Day 3)  Aspirin 81 Mg Tbec (Aspirin) .... Take One Tablet By Mouth Daily 4)  Lasix 40 Mg Tabs (Furosemide) .Marland Kitchen.. 1 By Mouth Once Daily 5)  Lisinopril 40 Mg Tabs (Lisinopril) .Marland Kitchen.. 1 By Mouth Daily 6)  Norvasc 10 Mg Tabs (Amlodipine Besylate) .... Take 1 Tablet By Mouth Once A Day 7)  Indomethacin 50 Mg Caps (Indomethacin) .Marland Kitchen.. 1 By Mouth Three Times A Day As Needed Gout Flare 8)  Lipitor 40 Mg Tabs (Atorvastatin Calcium) .... Take One By Mouth Daily 9)  Coreg 6.25 Mg Tabs (Carvedilol) .... Take One Two Times A Day 10)  Tricor 145 Mg  Tabs (Fenofibrate) .Marland Kitchen.. 1 By Mouth Daily 11)  Viagra 100  Mg Tabs (Sildenafil Citrate) .Marland Kitchen.. 1 Tab By Mouth 30 Minutes Prior To Sexual Intercourse 12)  Lantus 100 Unit/ml Soln (Insulin Glargine) .... Take 12 Units Once At Night  Allergies: 1)  ! Percodan  Past History:  Past Medical History: Aortic stenosis-moderate to severe by echo 12/11 and cath 2/12. HTN Non-ischemic CM, EF of 35-40%.  Hepatitis in 20's, resolved Bacterial meningitis Pneumonia Hyperlipidemia Legally blind OD (amblyopia as child),  MRSA thigh abscess 11/06,  Anxiety Has used `Rice diet` at times to lose weight DM  Social History: Reviewed history from 10/16/2010 and no changes required. Retired Air cabin crew ed No tobacco No alcohol No illicit drug use Married, 2 children  Review of Systems  The patient denies fatigue, malaise, fever, weight gain/loss, vision loss, decreased hearing, hoarseness, chest pain, palpitations, shortness of breath, prolonged cough, wheezing, sleep apnea, coughing up blood, abdominal pain, blood in stool, nausea, vomiting, diarrhea, heartburn, incontinence, blood in urine, muscle weakness, joint pain, leg swelling, rash, skin lesions, headache, fainting, dizziness, depression, anxiety, enlarged lymph nodes, easy bruising or bleeding, and environmental allergies.    Vital Signs:  Patient profile:   60 year old male Height:      71 inches Weight:      350 pounds BMI:     48.99 Pulse rate:   80 / minute Resp:  14 per minute BP sitting:   110 / 68  (right arm)  Vitals Entered By: Kem Parkinson (December 11, 2010 4:04 PM)  Physical Exam  General:  General: Well developed, well nourished, NAD HEENT: OP clear, mucus membranes moist SKIN: warm, dry Neuro: No focal deficits Musculoskeletal: Muscle strength 5/5 all ext Psychiatric: Mood and affect normal Neck: No JVD, no carotid bruits, no thyromegaly, no lymphadenopathy. Lungs:Clear bilaterally, no wheezes, rhonci, crackles CV: RRR with loud systolic  murmurs, gallops  rubs Abdomen: soft, NT, ND, BS present Extremities: 1+ bilateral lower ext  edema, pulses 2+.    Echocardiogram  Procedure date:  11/21/2010  Findings:      HEMODYNAMIC FINDINGS:  Central aortic pressure 117/76.  Left ventricular pressure 155/18.  Left ventricular end-diastolic pressure 27.  Right atrial pressure 10.  Right ventricular pressure 37/11.  Right ventricular end-diastolic pressure 12, pulmonary artery pressure 35/18 with a mean of 26, pulmonary capillary wedge pressure 15.  Cardiac output 5.6 L per minute.  Cardiac index 2.1 L per minute per meter squared.  Pulmonary artery saturation 64%.  Central aortic saturation 94%.  Calculated aortic valve area of 1.0 cm2 with a peak-to-peak gradient of 31 mmHg.   ANGIOGRAPHIC FINDINGS: 1. The left main coronary artery had no evidence of disease. 2. The circumflex artery gave off a large bifurcating obtuse marginal     branch that is free of disease.  The AV groove circumflex is     moderate-sized and had no disease.  There was a small     posterolateral branch noted with no disease. 3. The left anterior descending artery was a large vessel that coursed     to the apex and wrapped around the apex.  There were several small-     caliber diagonal branches that were free of disease.  There were no     focal lesions noted in the left anterior descending artery. 4. The right coronary artery is a small and nondominant vessel that is     free of disease. 5. Left ventricular angiogram was performed in the RAO projection and     showed moderate left ventricular systolic dysfunction with ejection     fraction of 35% to 40%.   IMPRESSION: 1. No angiographic evidence of coronary artery disease. 2. Moderate left ventricular systolic dysfunction secondary to     nonischemic cardiomyopathy. 3. Moderately severe aortic valve stenosis.    Impression & Recommendations:  Problem # 1:  AORTIC STENOSIS (ICD-424.1) Moderate to severe AS by  cath and echo. No chest pain, SOB or syncope. His LVEF is reduced at 35-40%. Will repeat echo in 6 months. If any change in symptoms, LV size or LV function, will have to consider AVR.   His updated medication list for this problem includes:    Lasix 40 Mg Tabs (Furosemide) .Marland Kitchen... 1 by mouth once daily    Lisinopril 40 Mg Tabs (Lisinopril) .Marland Kitchen... 1 by mouth daily    Coreg 6.25 Mg Tabs (Carvedilol) .Marland Kitchen... Take one two times a day  Problem # 2:  CARDIOMYOPATHY, SECONDARY (ICD-425.9) Continue beta blocker, ACE-inh, ASA and Lasix.   His updated medication list for this problem includes:    Aspirin 81 Mg Tbec (Aspirin) .Marland Kitchen... Take one tablet by mouth daily    Lasix 40 Mg Tabs (Furosemide) .Marland Kitchen... 1 by mouth once daily    Lisinopril 40 Mg Tabs (Lisinopril) .Marland Kitchen... 1 by mouth daily    Norvasc 10 Mg Tabs (Amlodipine besylate) .Marland Kitchen... Take  1 tablet by mouth once a day    Coreg 6.25 Mg Tabs (Carvedilol) .Marland Kitchen... Take one two times a day  Patient Instructions: 1)  Your physician recommends that you schedule a follow-up appointment in: 6 months 2)  Echo in 6 months.

## 2011-01-13 LAB — GLUCOSE, CAPILLARY: Glucose-Capillary: 319 mg/dL — ABNORMAL HIGH (ref 70–99)

## 2011-01-29 ENCOUNTER — Other Ambulatory Visit: Payer: Self-pay | Admitting: Family Medicine

## 2011-01-29 NOTE — Telephone Encounter (Signed)
Refill request

## 2011-03-07 ENCOUNTER — Other Ambulatory Visit: Payer: Self-pay | Admitting: Family Medicine

## 2011-03-07 NOTE — Telephone Encounter (Signed)
Refill request

## 2011-03-16 NOTE — Op Note (Signed)
[name], Antonio [name]                 ACCOUNT NO.:  [id number]   MEDICAL RECORD NO.:  [id number]          PATIENT TYPE:  AMB   LOCATION:  SDS                          FACILITY:  [facility]   PHYSICIAN:  Cherylynn Ridges, M.D.    DATE OF BIRTH:  05/24/1951   DATE OF PROCEDURE:  09/03/2005  DATE OF DISCHARGE:                                 OPERATIVE REPORT   PREOPERATIVE DIAGNOSIS:  Left upper inner thigh soft tissue abscess.   POSTOPERATIVE DIAGNOSIS:  Left upper inner thigh soft tissue abscess.   PROCEDURE:  Incision and drainage of left upper posterior thigh abscess.   SURGEON:  Cherylynn Ridges, M.D.   ANESTHESIA:  General with an endotracheal.   ESTIMATED BLOOD LOSS:  Less than 50 mL.   COMPLICATIONS:  None.   CONDITION:  Stable.   SPECIMEN:  Aerobic and aerobic culture of left upper thigh abscess.   INDICATIONS FOR OPERATION:  The patient is a 60 year old gentleman who comes  in with an upper inner thigh abscess, white count of 23,000, who is  diabetic.   OPERATION:  The patient was taken to the operating room and placed on the  table in a supine position.  After an adequate endotracheal anesthetic was  administered, he was prepped and draped in usual sterile manner, exposing  the left thigh, after he was placed in lithotomy position.   The incision was made using a #10 blade and taken down deep into the  abscess.  The incision was made approximately 12 cm long.  It was evacuated  of all its pus using blunt technique with both finger dissection and a lap  tape.  Once we had adequately drained all the pus, we packed with half-  strength Betadine-soaked Kerlix gauze.  A sterile dressing was applied.  All  counts were correct.  The patient was taken to the recovery room in stable  condition.      Cherylynn Ridges, M.D.  Electronically Signed     JOW/MEDQ  D:  09/03/2005  T:  09/04/2005  Job:  [id number]

## 2011-03-16 NOTE — Discharge Summary (Signed)
Antonio [name], Antonio [name]                 ACCOUNT NO.:  [id number]   MEDICAL RECORD NO.:  [id number]          PATIENT TYPE:  INP   LOCATION:  5730                         FACILITY:  [facility]   PHYSICIAN:  Rolm Gala, M.D.    DATE OF BIRTH:  12/15/1950   DATE OF ADMISSION:  09/03/2005  DATE OF DISCHARGE:  09/17/2005                                 DISCHARGE SUMMARY   PRIMARY CARE PHYSICIAN:  Family practice teaching service.   CONSULTING PHYSICIANS:  1.  Dr. Lindie Spruce, [facility].  2.  Infectious disease.  3.  Pharmacy.  4.  Pulmonary medicine critical care service.   PROCEDURES:  1.  I&D of the left thigh abscess on September 03, 2005.  2.  TEE on September 12, 2005, showed no vegetation, aortic stenosis.  3.  An echo done on September 10, 2005, was a very difficult study due to      acoustic window but overall with a mild aortic stenosis, no vegetations,      EF 55 to 65%.   FINAL DIAGNOSES:  1.  Methicillin resistant Staphylococcus aureus infection of the thigh      abscess.  2.  Aortic stenosis.  3.  Pneumonia.  4.  Pulmonary edema.  5.  Diabetes type 2.  6.  Hypertension.  7.  Hyperlipidemia.  8.  Gout.  9.  Morbid obesity.  10. Lumbar disc disease.  11. Venous insufficiency.   LABORATORY DATA:  CBC on day of discharge show wbc 14.7, H&H 12.8/39,  platelet count 530.  Blood cultures from September 10, 2005, were negative  x2.  A serum uric acid on September 16, 2005, was 6.5.  ESR on September 12, 2005, was 104.  BNP on September 12, 2005, was 32.8. Cardiac enzymes were  negative x3.  A Zenith lactic acid on September 10, 2005, was 0.8.  BNP on  September 10, 2005, was 66.6.  Sputum culture on September 10, 2005, showed  that the specimen was not suggestive of lower respiratory tract excretions.  An anaerobic culture of the left thigh abscess from September 03, 2005,  showed gram positive cocci in clusters in one of them and the other showed  MRSA.  Microscopic urine on  September 03, 2005, showed 0 to 2 white blood  cells, 0 to 2 rbc.  A UA on the same day showed specific gravity 1.025,  positive for only 100 protein, 15 ketones.  A CMP showed sodium 136,  potassium 3.7, chloride 101, bicarb 30, glucose 219, BUN 14, creatinine 1.  Bilirubin 0.6, alkaline phosphatase 92, AST 22, ALT 22, total protein 7.6,  albumin 3.6, calcium 8.7.   HOSPITAL COURSE:  PROBLEM #1 -  METHICILLIN RESISTANT STAPHYLOCOCCUS AUREUS  OF LEFT THIGH ABSCESS:  This was I&D'd by Dr. Lindie Spruce on September 03, 2005.  Culture showed that this abscess was MRSA positive and patient was initially  started on vancomycin but then transitioned to oral doxy.  He completed a 12-  day course of antibiotics while in the hospital and will continue  doxycycline as an outpatient for  two more days.  Patient will follow up with  Dr. Lindie Spruce with [facility] on September 27, 2005.  At discharge,  dressing was clean, dry and intact.  Wound seemed to be healing nicely.   PROBLEM #2 -  MILD AORTIC STENOSIS:  Patient has been known to have a  systolic ejection murmur, harsh for over a month.  On an echo and TEE, this  is shown to be a mild aortic stenosis.  Patient reports no syncope.   PROBLEM #3 -  PNEUMONIA:  During his hospital course, patient began to be  short of breath, had a bump in his white blood cells and had a fever.  Patient began to be hypoxic where he was on, at one time, four liters of  oxygen.  The oxygen was slowly weaned as the patient continued to improve.  A chest x-ray on September 08, 2005, showed an extensive right lung  pneumonia, not present on the prior exam.  A chest x-ray on September 10, 2005, showed progressive consolidation of the right lung.  A chest x-ray on  September 12, 2005, showed bilateral air space disease, again noted slightly  improved.   PROBLEM #4 -  PULMONARY EDEMA:  This improved with IV Lasix.  Patient  diuresed well.   PROBLEM #5 -  DIABETES TYPE 2:   Patient was continued on his home  medications, Amaryl and Glucophage.  However, patient continued to have  elevated CBGs.  Patient was started on Lantus 10 units daily but then was  increased to 12 units at discharge.  Patient was given Glucometer strips at  discharge.  Patient will follow up with Dr.  Gavin Potters in the [facility] on September 19, 2005, for this problem.   PROBLEM #6 -  HYPERLIPIDEMIA:  Patient was continued on his home  medications.   PROBLEM #7 -  GOUT:  Patient had an episode where both feet actually got red  and swollen.  Patient was started on Naproxen to great relief.  Patient was  discharged with a prescription for this medicine.   PROBLEM #8 -  MORBID OBESITY:  Patient is morbidly obese and was advised to  exercise and eat right.   PROBLEM #9 -  LUMBAR DISC DISEASE:  Likely in part, due to morbid obesity.  Will follow as an outpatient.   PROBLEM #10 -  VENOUS INSUFFICIENCY:  Patient had some slight leg swelling.  This was stable at discharge.   Patient and family were instructed to follow up with Dr. Gavin Potters at [facility] on September 19, 2005, and [facility] with Dr.  Lindie Spruce on September 27, 2005.   DISCHARGE MEDICATIONS:  1.  Glucometer strips.  2.  Lisinopril 40 mg p.o. daily.  3.  Amaryl 4 mg p.o. b.i.d.  4.  Glucophage 1000 mg p.o. b.i.d.  5.  Aspirin 81 mg daily.  6.  Doxycycline 100 mg p.o. b.i.d. until evening of September 19, 2005.  7.  Norvasc 10 mg p.o. daily.  8.  Lantus 12 units take before bedtime.  9.  Naproxen 50 mg p.o. b.i.d. p.r.n. gout.      Rolm Gala, M.D.     Bennetta Laos  D:  09/17/2005  T:  09/18/2005  Job:  [id number]   cc:   [facility] Teaching Service

## 2011-03-16 NOTE — Consult Note (Signed)
NAMESAHIR, TOLSON NO.:  [id number]   MEDICAL RECORD NO.:  [id number]          PATIENT TYPE:  INP   LOCATION:  2922                         FACILITY:  [facility]   PHYSICIAN:  Pricilla Riffle, M.D.    DATE OF BIRTH:  08-06-1951   DATE OF CONSULTATION:  09/10/2005  DATE OF DISCHARGE:                                   CONSULTATION   IDENTIFICATION:  [name] is a 60 year old gentleman who we are asked to see  regarding a murmur and also bacteremia.   HISTORY OF PRESENT ILLNESS:  The patient is a 60 year old with no known  coronary artery disease.  He was admitted on September 04, 2005, for a left  thigh abscess.  Continued to have fevers.  On September 08, 2005, had  hypoxia, increased respiratory distress.  Chest x-ray consistent with a  pneumonia.  MRSA was growing in the wound and antibiotics were adjusted.  On  September 10, 2005, heart rate increased, sats decreased to the 80s, patient  became hypertensive, treated with IV Lasix and transferred to the ICU.  Patient improved with Lasix.  Chest x-ray consistent with extensive right  lung pneumonia, cardiomegaly.  Asked to see regarding this.   After Lasix, patient denies shortness of breath.  He does not remember them.  Note, the patient has been told he has had a murmur for several years.   ALLERGIES:  PERCODAN.   CURRENT MEDICATIONS:  1.  Glucophage 1 g b.i.d.  2.  Amaryl 4 daily.  3.  Zocor 40 daily.  4.  Lisinopril 40 daily.  5.  Majik mouthwash q.i.d.  6.  Ventolin q.6h.  7.  Zithromax 500.  8.  Cough syrup.  9.  Naprosyn 500 b.i.d.  10. Vancomycin.   PAST MEDICAL HISTORY:  1.  Diabetes.  2.  Hypertension.  3.  Dyslipidemia.  4.  Obesity.   Patient undergoing evaluation for gastric bypass surgery.  He  has had a  cardiac MRI and a stress test by report at [facility].   SOCIAL HISTORY:  The patient lives in [place], [place].  He works  in the [employer].  Does  not smoke.  Does not drink.   FAMILY HISTORY:  Mother died of cancer.  Father had coronary artery disease,  CHF, deceased.   REVIEW OF SYSTEMS:  Significant for fevers, chills, sweats, shortness of  breath, dyspnea on exertion prior to admission, some orthopnea, PND.  Occasional palpitations, occasional wheezing.  Otherwise all systems  reviewed, negative to the above problem except as noted.   PHYSICAL EXAMINATION:  GENERAL APPEARANCE:  The patient is in no distress.  VITAL SIGNS:  Blood pressure 130/83, pulse 97, temperature 98.4, o2son two  liters 95%.  HEENT:  Normocephalic and atraumatic.  PERRL.  EOMI.  NECK:  Right carotid without a bruit.  Left with IJ catheter in neck.  LUNGS:  Decreased breath sounds at the right base half way up.  Occasional  crackles.  CARDIOVASCULAR:  Regular rate and rhythm with grade 2/6 systolic murmur  heard best  at the base.  No S3 or S4.  ABDOMEN:  Obese, nontender, no right upper quadrant tenderness.  EXTREMITIES:  Trace edema.  Note feet joints are red.   Chest x-ray as noted above.   A 12-lead EKG normal sinus rhythm at a rate of 98 beats per minute.   IMPRESSION:  [name] is a 60 year old gentleman admitted with abscess in  his leg complicated by what appears to be a right-sided pneumonia.  Blood  culture is positive for methicillin resistant Staphylococcus aureus.   Echocardiogram  today shows mild aortic stenosis with aortic calcification,  no definite vegetations are seen, however, cannot completely exclude given  the very poor acoustic window.   Agree with proceeding with TEE.  In regard to the patient's shortness of  breath and possible pulmonary edema, he responded with IV Lasix before and  is not unreasonable to give him another try to see how his clinical exam/x-  ray improve.  May be indeed due to some diastolic dysfunction.   NPO after midnight.           ______________________________  Pricilla Riffle, M.D.     PVR/MEDQ   D:  09/10/2005  T:  09/11/2005  Job:  [id number]

## 2011-03-31 ENCOUNTER — Inpatient Hospital Stay (HOSPITAL_COMMUNITY)
Admission: EM | Admit: 2011-03-31 | Discharge: 2011-04-04 | DRG: 127 | Disposition: A | Discharge status: Home Health | Payer: BC Managed Care – PPO | Attending: Cardiovascular Disease | Admitting: Cardiovascular Disease

## 2011-03-31 ENCOUNTER — Emergency Department (HOSPITAL_COMMUNITY): Payer: BC Managed Care – PPO

## 2011-03-31 DIAGNOSIS — I509 Heart failure, unspecified: Secondary | ICD-10-CM

## 2011-03-31 DIAGNOSIS — M109 Gout, unspecified: Secondary | ICD-10-CM | POA: Diagnosis present

## 2011-03-31 DIAGNOSIS — E785 Hyperlipidemia, unspecified: Secondary | ICD-10-CM | POA: Diagnosis present

## 2011-03-31 DIAGNOSIS — Z7982 Long term (current) use of aspirin: Secondary | ICD-10-CM

## 2011-03-31 DIAGNOSIS — I10 Essential (primary) hypertension: Secondary | ICD-10-CM | POA: Diagnosis present

## 2011-03-31 DIAGNOSIS — I428 Other cardiomyopathies: Secondary | ICD-10-CM | POA: Diagnosis present

## 2011-03-31 DIAGNOSIS — E119 Type 2 diabetes mellitus without complications: Secondary | ICD-10-CM | POA: Diagnosis present

## 2011-03-31 DIAGNOSIS — Z794 Long term (current) use of insulin: Secondary | ICD-10-CM

## 2011-03-31 DIAGNOSIS — I5023 Acute on chronic systolic (congestive) heart failure: Principal | ICD-10-CM | POA: Diagnosis present

## 2011-03-31 DIAGNOSIS — Z79899 Other long term (current) drug therapy: Secondary | ICD-10-CM

## 2011-03-31 LAB — CBC
HCT: 42 % (ref 39.0–52.0)
Hemoglobin: 14.3 g/dL (ref 13.0–17.0)
MCH: 27.8 pg (ref 26.0–34.0)
MCHC: 34 g/dL (ref 30.0–36.0)
MCV: 81.7 fL (ref 78.0–100.0)
Platelets: 312 10*3/uL (ref 150–400)
RBC: 5.14 MIL/uL (ref 4.22–5.81)
RDW: 13.5 % (ref 11.5–15.5)
WBC: 14.4 10*3/uL — ABNORMAL HIGH (ref 4.0–10.5)

## 2011-03-31 LAB — BASIC METABOLIC PANEL
BUN: 23 mg/dL (ref 6–23)
CO2: 26 mEq/L (ref 19–32)
Calcium: 9.1 mg/dL (ref 8.4–10.5)
Glucose, Bld: 154 mg/dL — ABNORMAL HIGH (ref 70–99)
Potassium: 3.6 mEq/L (ref 3.5–5.1)
Sodium: 142 mEq/L (ref 135–145)

## 2011-03-31 LAB — DIFFERENTIAL
Basophils Absolute: 0 K/uL (ref 0.0–0.1)
Basophils Relative: 0 % (ref 0–1)
Eosinophils Absolute: 0.2 10*3/uL (ref 0.0–0.7)
Eosinophils Relative: 1 % (ref 0–5)
Lymphocytes Relative: 20 % (ref 12–46)
Lymphs Abs: 2.9 10*3/uL (ref 0.7–4.0)
Monocytes Absolute: 1.2 K/uL — ABNORMAL HIGH (ref 0.1–1.0)
Monocytes Relative: 8 % (ref 3–12)
Neutro Abs: 10.1 K/uL — ABNORMAL HIGH (ref 1.7–7.7)
Neutrophils Relative %: 71 % (ref 43–77)

## 2011-03-31 LAB — BASIC METABOLIC PANEL WITH GFR
Chloride: 104 meq/L (ref 96–112)
Creatinine, Ser: 0.71 mg/dL (ref 0.4–1.5)
GFR calc Af Amer: >60 mL/min (ref >60)
GFR calc non Af Amer: >60 mL/min (ref >60)

## 2011-03-31 LAB — PRO B NATRIURETIC PEPTIDE: Pro B Natriuretic peptide (BNP): 1518 pg/mL — ABNORMAL HIGH (ref 0–125)

## 2011-03-31 LAB — TROPONIN I: Troponin I: <0.3 ng/mL (ref <0.30)

## 2011-03-31 LAB — CK TOTAL AND CKMB (NOT AT ARMC)
CK, MB: 4.6 ng/mL — ABNORMAL HIGH (ref 0.3–4.0)
Relative Index: 3.5 — ABNORMAL HIGH (ref 0.0–2.5)
Total CK: 132 U/L (ref 7–232)

## 2011-04-01 ENCOUNTER — Inpatient Hospital Stay (HOSPITAL_COMMUNITY): Payer: BC Managed Care – PPO

## 2011-04-01 DIAGNOSIS — I5023 Acute on chronic systolic (congestive) heart failure: Secondary | ICD-10-CM

## 2011-04-01 LAB — COMPREHENSIVE METABOLIC PANEL
Albumin: 3.4 g/dL — ABNORMAL LOW (ref 3.5–5.2)
Alkaline Phosphatase: 69 U/L (ref 39–117)
BUN: 25 mg/dL — ABNORMAL HIGH (ref 6–23)
CO2: 34 mEq/L — ABNORMAL HIGH (ref 19–32)
Chloride: 100 mEq/L (ref 96–112)
GFR calc non Af Amer: >60 mL/min (ref >60)
Glucose, Bld: 188 mg/dL — ABNORMAL HIGH (ref 70–99)
Potassium: 3.6 mEq/L (ref 3.5–5.1)
Total Bilirubin: 0.4 mg/dL (ref 0.3–1.2)

## 2011-04-01 LAB — CBC
HCT: 42 % (ref 39.0–52.0)
MCH: 27.3 pg (ref 26.0–34.0)
MCV: 83 fL (ref 78.0–100.0)
RBC: 5.06 MIL/uL (ref 4.22–5.81)
WBC: 11.3 10*3/uL — ABNORMAL HIGH (ref 4.0–10.5)

## 2011-04-01 LAB — GLUCOSE, CAPILLARY
Glucose-Capillary: 158 mg/dL — ABNORMAL HIGH (ref 70–99)
Glucose-Capillary: 93 mg/dL (ref 70–99)

## 2011-04-01 LAB — DIFFERENTIAL
Lymphocytes Relative: 22 % (ref 12–46)
Lymphs Abs: 2.5 10*3/uL (ref 0.7–4.0)
Monocytes Relative: 8 % (ref 3–12)
Neutrophils Relative %: 68 % (ref 43–77)

## 2011-04-01 LAB — PHOSPHORUS: Phosphorus: 4.9 mg/dL — ABNORMAL HIGH (ref 2.3–4.6)

## 2011-04-01 LAB — MAGNESIUM: Magnesium: 2.1 mg/dL (ref 1.5–2.5)

## 2011-04-02 LAB — BASIC METABOLIC PANEL
Chloride: 98 mEq/L (ref 96–112)
Creatinine, Ser: 0.83 mg/dL (ref 0.4–1.5)
GFR calc Af Amer: >60 mL/min (ref >60)
Sodium: 140 mEq/L (ref 135–145)

## 2011-04-02 LAB — GLUCOSE, CAPILLARY
Glucose-Capillary: 168 mg/dL — ABNORMAL HIGH (ref 70–99)
Glucose-Capillary: 251 mg/dL — ABNORMAL HIGH (ref 70–99)
Glucose-Capillary: 108 mg/dL — ABNORMAL HIGH (ref 70–99)

## 2011-04-02 LAB — PRO B NATRIURETIC PEPTIDE: Pro B Natriuretic peptide (BNP): 634.9 pg/mL — ABNORMAL HIGH (ref 0–125)

## 2011-04-03 LAB — BASIC METABOLIC PANEL
BUN: 34 mg/dL — ABNORMAL HIGH (ref 6–23)
Calcium: 9.2 mg/dL (ref 8.4–10.5)
GFR calc non Af Amer: >60 mL/min (ref >60)
Potassium: 5 mEq/L (ref 3.5–5.1)

## 2011-04-03 LAB — GLUCOSE, CAPILLARY
Glucose-Capillary: 187 mg/dL — ABNORMAL HIGH (ref 70–99)
Glucose-Capillary: 191 mg/dL — ABNORMAL HIGH (ref 70–99)

## 2011-04-04 LAB — BASIC METABOLIC PANEL
BUN: 37 mg/dL — ABNORMAL HIGH (ref 6–23)
Chloride: 95 mEq/L — ABNORMAL LOW (ref 96–112)
GFR calc Af Amer: >60 mL/min (ref >60)
GFR calc non Af Amer: >60 mL/min (ref >60)
Potassium: 5.2 mEq/L — ABNORMAL HIGH (ref 3.5–5.1)
Sodium: 135 mEq/L (ref 135–145)

## 2011-04-04 LAB — GLUCOSE, CAPILLARY: Glucose-Capillary: 179 mg/dL — ABNORMAL HIGH (ref 70–99)

## 2011-04-04 NOTE — H&P (Signed)
NAMEGAYLE, Antonio [name] NO.:  [id number]  MEDICAL RECORD NO.:  [id number]           PATIENT TYPE:  E  LOCATION:  [facility]                         FACILITY:  [facility]  PHYSICIAN:  Cherene Altes, MD     DATE OF BIRTH:  29-Mar-1951  DATE OF ADMISSION:  03/31/2011 DATE OF DISCHARGE:                             HISTORY & PHYSICAL   PRIMARY CARDIOLOGIST:  Verne Carrow, MD  PRIMARY MD:  Loyal Jacobson, MD  REASON FOR ADMISSION:  Congestive heart failure.  HISTORY OF PRESENT ILLNESS:  [name] is a 59-year white man with a medical history of nonischemic cardiomyopathy with ejection fraction of 35-40% diagnosed in January of this year as well as moderate-to-severe aortic stenosis, who presented to his primary care physician today complaining of severe dyspnea on exertion.  The patient states he has had 15 pounds weight gain over the past 2 weeks with progressive dyspnea, and yesterday 9 p.m. he has severe PND that kept him from sleeping.  The patient also complains of worsening peripheral edema.  He has been compliant with his medicine.  However, he is not check daily weights nor receiving compliant with diet.  His O2 sats on arrival in the emergency department were reported to be in the 80s.  The patient was treated with Lasix 80 mg IV with a mild degree of improvement in symptoms.  REVIEW OF SYSTEMS:  The patient denies fevers, chills, headache, rash, GI or GU symptoms.  All other systems are negative except as described in HPI.  PAST MEDICAL HISTORY: 1. Nonischemic cardiomyopathy with ejection fraction of 35-40%. 2. Moderate-to-severe aortic stenosis. 3. Diabetes. 4. Hypertension. 5. Hyperlipidemia. 6. Morbid obesity.  SOCIAL HISTORY:  The patient is married.  No tobacco, alcohol, or drugs.  FAMILY HISTORY:  Mother died of cancer.  Father had an MI in his 3s and died of congestive heart failure.  ALLERGIES:  PERCODAN.  MEDICATIONS:  (The patient does  not know doses) 1. Amaryl. 2. Glimepiride. 3. Indomethacin. 4. Lasix. 5. Lipitor. 6. Lisinopril. 7. Metformin. 8. TriCor.  PHYSICAL EXAMINATION:  VITAL SIGNS:  Afebrile, pulse 94, respiratory rate 20, blood pressure 118/64, and O2 sats 96% on 2 liters nasal cannula. GENERAL:  An obese white male, alert and oriented x3, in no acute distress. HEENT:  Normocephalic and atraumatic.  Extraocular movements intact. Mucous membranes moist. NECK:  Supple without lymphadenopathy.  Unable to assess JVP secondary to the patient's habitus. CARDIOVASCULAR:  Regular rate and rhythm, 3/6 mid to late peaking systolic ejection murmur, best heard over the left upper sternal border. Pulses 1+ and equal bilaterally.  LUNGS:  With poor air entry bilaterally.  Bilateral rales to mid back. SKIN:  Warm and dry.  Skin changes consistent with chronic venous stasis on bilateral lower extremities. ABDOMEN:  Benign. EXTREMITIES:  3+ edema in bilateral lower extremities. MUSCULOSKELETAL:  No joint deformity or effusion. NEURO:  Alert and oriented x3.  Cranial nerves II through XII intact.  RADIOLOGY:  Chest x-ray (per report from PCP) pulmonary edema.  EKG normal sinus rhythm with a rate of 91, first-degree AV block, no ST-T  wave changes.  White count is 14.4 with 71% neutrophils, 21% lymphocytes.  Hemoglobin 14.3, hematocrit 42, and platelets 312. Electrolytes and renal function are within normal limits.  BNP is 1518. Cardiac enzymes are negative x1.  IMPRESSION AND PLAN: 1. Acute-on-chronic systolic heart failure decompensation and the     patient presents with complaints of PND, worsening peripheral     edema, and worsening dyspnea with a 15-pound weight gain over the     past 2 weeks.  The patient has rales and bilateral lower extremity     edema on exam.  In addition, his BNP is elevated and his chest x-     ray report from his primary care physician showed pulmonary edema.     The patient had good  response with Lasix 80 mg IV in the emergency     department.  We will continue his dose of Lasix IV q.12 h, continue     the patient on lisinopril 40 mg daily, and follow the patient with     strict in's and out's, daily weights, and low sodium diet.  We will     educate the patient on daily weights at home as well as diet     compliance. 2. Hypertension.  The patient is to continue his home meds. 3. Diabetes.  Sliding scale insulin.  Hemoglobin A1c pending. 4. Hyperlipidemia.  Continue Lipitor. 5. Moderate-to-severe aortic stenosis.  The patient does have a murmur     on exam that has been previously documented.  He had a cardiac     catheterization in January and at that time the peak gradient was     in the 30s.  This is not definitely patient's drop in the EF or     fully explain this symptomatology.  It is unclear how much his     aortic stenosis is impacting on his congestive heart failure.  We     will consider repeat echocardiography this admission.     Cherene Altes, MD     PS/MEDQ  D:  03/31/2011  T:  04/01/2011  Job:  [id number]  Electronically Signed by Cherene Altes M.D. on 04/04/2011 08:27:06 PM

## 2011-04-05 ENCOUNTER — Telehealth: Payer: Self-pay | Admitting: Cardiovascular Disease

## 2011-04-05 NOTE — Telephone Encounter (Signed)
Got a refferal for him to be opened up for HF and he was unstable on his feet so it came up he needs a PT eval so they calling to see if that will be ok and they need to talk about meds because some of his med he was suppose to be taking bid he was taking them all at one time

## 2011-04-05 NOTE — Telephone Encounter (Signed)
Amy, HHRN, wanted to inform me that they are starting [name] on the HF protocol with [facility]. He has also not been compliant with his medications (but would be now). He was very unsteady on his feet and she recommends a PT Evaluation. Advised her that this should be okay to evaluate his fall risk. Will follow up with Antonio Newcomer, PA in July.

## 2011-04-09 ENCOUNTER — Telehealth: Payer: Self-pay | Admitting: Cardiovascular Disease

## 2011-04-09 NOTE — Telephone Encounter (Signed)
I spoke with the pt and his daughter lives in [place] and she would like to come home and help take care of her dad for about 4 weeks.  I made him aware that his daughter would have to get FMLA forms completed by Dr Clifton James to help cover her time away from work.  I gave him the office fax number and he will have his daughter send paperwork to our office for completion. The pt said he was also having billing issues and I gave him the number to [employer].

## 2011-04-09 NOTE — Telephone Encounter (Signed)
No information given, only wants to speak with nurse.

## 2011-04-16 NOTE — Telephone Encounter (Signed)
Spoke with health port and they DID not receive FMLA paperwork for patient's daughter. I have called [name] daughter twice and left a message explaining to her that she will need to refax the paperwork.

## 2011-04-27 NOTE — Discharge Summary (Signed)
[name], [name] NO.:  [id number]  MEDICAL RECORD NO.:  [id number]  LOCATION:  2037                         FACILITY:  [facility]  PHYSICIAN:  Verne Carrow, MDDATE OF BIRTH:  [birth year]  DATE OF ADMISSION:  03/31/2011 DATE OF DISCHARGE:  04/04/2011                              DISCHARGE SUMMARY   PRIMARY CARDIOLOGIST:  Verne Carrow, MD  PRIMARY CARE PROVIDER:  Loyal Jacobson, MD  DISCHARGE DIAGNOSES: 1. Acute on chronic systolic congestive heart failure.     a.     Discharge weight 162.9. 2. Nonischemic cardiomyopathy, ejection fraction 35-40%. 3. Hypertension. 4. Insulin-dependent diabetes mellitus. 5. Hyperlipidemia. 6. Moderate-to-severe aortic stenosis, max valve area 0.87 cm2 with a     mean gradient of 31 mmHg per echo on October 10, 2010. 7. Gout, improved.  SECONDARY DIAGNOSIS:  Morbid obesity.  ALLERGIES:  OXYCODONE causing a rash and itching.  PROCEDURES/DIAGNOSTICS PERFORMED DURING HOSPITALIZATION:  Chest x-ray on April 01, 2011:  Mild cardiomegaly with vascular congestion and basilar atelectasis versus developing airspace disease.  CONSULTATION:  Nutrition.  REASON FOR HOSPITALIZATION:  This is a 60 year old Caucasian gentleman with the above-stated problem list, who presented to his primary care provider on March 30, 2011, with complaints of severe dyspnea on exertion. The patient had noted a 50-pound weight gain over the past 2 weeks.  His dyspnea had become progressive with worsening peripheral edema.  The patient was noted to have severe PND and therefore presented for further evaluation.  The patient was sent to the emergency department where his initial O2 sats were noted to be in the 80s.  The patient was given Lasix 80 mg IV with minimal improvement in his symptoms.  The patient's initial BNP was noted to be 1518.  He was admitted for further treatment of acute on chronic systolic congestive heart  failure.  HOSPITAL COURSE:  The patient was transferred to the telemetry unit.  He was continued on IV Lasix as he remained volume overloaded for the next 48 hours.  First 24 hours of IV diuresis, the patient had an output of greater than 2 liters.  His dyspnea was much improved.  His edema was on the decline.  At this time, the patient's potassium was noted to be borderline.  Therefore, he was placed on supplementation.  On April 02, 2011, the patient was transitioned to p.o. Lasix.  At this time, his BNP had decreased to 634 from 1518.  Again, his symptoms had improved where he has no further complaints of shortness of breath or PND.  The patient's beta-blocker was initially held secondary to decompensation of his heart failure but this was added back to his regimen on April 02, 2011.  On transitioning to oral Lasix, the patient continued to lose approximately 3 liters of fluid within the next 24 hours.  He was felt to the near baseline by April 03, 2011, and by the day of discharge, the patient's weight was down to 162.9.  No further complaints of chest pain or shortness of breath.  The patient was felt to be at baseline.  Dr. Clifton James discussed the importance of weighing himself daily and recording these.  If  he gains more than 3-4 pounds overnight, he is to call the office for further instruction.  He is also to continue a 1.5 liters fluid restriction as well as decreased salt intake.  The patient had pain in his left foot that was felt to be a gout flare. Dr. Clifton James prescribed IV steroids and in addition indomethacin 50 mg 3 times day.  By the next morning, the patient's foot pain had improved post IV steroids and he is to continue indomethacin at home for several days.  With the IV steroids, the patient's blood sugars were noted to be elevated but no changes were made to his home diabetes medication.  A nutrition consult was obtained on April 02, 2011, related to heart failure.  The  patient was given a heart failure nutrition handout and encouraged to pursue low-sodium products.  This was much appreciated.  On the day of discharge, Dr. Clifton James evaluated the patient and noted him stable for home with outpatient followup.  Again, he was without dyspnea, no complaints of chest pain, and able to ambulate without dyspnea on exertion.  Discharge meds and instructions were discussed with the patient and he voiced understanding.  Of note, the patient's pressures have been borderline hypotensive. Therefore, his Norvasc will be held until outpatient followup.  DISCHARGE LABS:  Sodium 135, potassium 5.2, chloride 95, bicarb 34, BUN 37, creatinine 0.91, glucose 220.  DISCHARGE MEDICATIONS: 1. Aspirin 81 mg 1 tablet daily. 2. Atorvastatin 40 mg 1 tablet daily. 3. Carvedilol 6.25 mg 2 tablets daily. 4. Fenofibrate 40 mg 1 tablet daily. 5. Furosemide 40 mg 1 tablet twice daily. 6. Amaryl 4 mg 1 tablet twice daily. 7. Indocin 50 mg 1 tablet 3 times a day as needed for gout flare. 8. Lantus sliding scale subcutaneously daily at bedtime. 9. Lisinopril 40 mg daily. 10.Metformin 1000 mg twice daily. 11.Please stop taking amlodipine.  DISCHARGE INSTRUCTIONS: 1. The patient will follow up with [name], physician assistant,     for Dr. Clifton James on April 30, 2011, at 10 a.m. 2. The patient is to increase activity as tolerated. 3. The patient is to continue a low-sodium heart-healthy diet and     continue a 1.5 liters fluid restriction per day. 4. The patient is to record daily weights and to call the office for a     3-4 pound of weight gain within 24 hours. 5. The patient is to follow up in the interim for any problems or     concerns.  DURATION OF DISCHARGE:  Greater than 30 minutes with physician and physician extender time.     [name], PA-C   ______________________________ Verne Carrow, MD    NB/MEDQ  D:  04/04/2011  T:  04/05/2011  Job:   [id number]  cc:   Loyal Jacobson, MD  Electronically Signed by Alen Blew P.A. on 04/22/2011 04:55:34 PM Electronically Signed by Verne Carrow MD on 04/27/2011 01:46:09 PM

## 2011-04-30 ENCOUNTER — Ambulatory Visit (INDEPENDENT_AMBULATORY_CARE_PROVIDER_SITE_OTHER): Payer: BC Managed Care – PPO | Admitting: Physician Assistant

## 2011-04-30 ENCOUNTER — Encounter: Payer: Self-pay | Admitting: Physician Assistant

## 2011-04-30 VITALS — BP 118/78 | HR 76 | Resp 16 | Ht 72.0 in | Wt 350.0 lb

## 2011-04-30 DIAGNOSIS — I5022 Chronic systolic (congestive) heart failure: Secondary | ICD-10-CM

## 2011-04-30 DIAGNOSIS — I359 Nonrheumatic aortic valve disorder, unspecified: Secondary | ICD-10-CM

## 2011-04-30 DIAGNOSIS — G4730 Sleep apnea, unspecified: Secondary | ICD-10-CM

## 2011-04-30 DIAGNOSIS — I10 Essential (primary) hypertension: Secondary | ICD-10-CM

## 2011-04-30 LAB — BASIC METABOLIC PANEL
CO2: 31 mEq/L (ref 19–32)
Chloride: 101 mEq/L (ref 96–112)
Potassium: 4 mEq/L (ref 3.5–5.1)
Sodium: 141 mEq/L (ref 135–145)

## 2011-04-30 NOTE — Assessment & Plan Note (Signed)
Suspect his heart failure was more related to LV dysfunction and fluid overload.  However, I will be in touch with his primary cardiologist to see if we should consider checking a follow up echocardiogram now.  Otherwise he can follow up in 4-6 weeks.

## 2011-04-30 NOTE — Assessment & Plan Note (Signed)
Formal sleep study pending via his PCP.

## 2011-04-30 NOTE — Patient Instructions (Signed)
Your physician recommends that you schedule a follow-up appointment in: 05/31/11 @ 12:15 to see Dr. Clifton James  Your physician recommends that you return for lab work in: TODAY BMET 428.22

## 2011-04-30 NOTE — Assessment & Plan Note (Signed)
Controlled.  Continue current therapy.  

## 2011-04-30 NOTE — Progress Notes (Signed)
History of Present Illness: Primary Cardiologist:  Dr. Verne Carrow  Antonio [name] is a 60 y.o. male who presents for post hospital follow up.  He has a history of moderately severe aortic stenosis, DM2, HTN, hyperlipidemia.  Cardiac catheterization done December 2011 demonstrated normal coronary arteries.  He has a nonischemic cardiomyopathy with an ejection fraction of about 45%.  He was admitted 6/2-6/6 with acute on chronic systolic heart failure.  He improved with IV diuresis.  Of note, amlodipine was held secondary to hypotension prior to discharge.  Labs: Sodium 135, potassium 5.2, chloride 95, CO2 34, BUN 37, creatinine 0.91, glucose 220, hemoglobin 13.8, ALT 13, TSH 4.251, BNP 634.9 (this was down from 1518), A1c 8.0.  He is doing well.  His breathing is improved.  He sleeps at an incline.  This is chronic without change.  He denies PND.  His lower extremity edema is improved.  He has cut back on his fluid intake.  He denies salt indiscretion.  He denies chest pain or syncope.  He had a recent overnight oximetry done which was abnormal.  He now has a formal sleep study arranged by his PCP.  Past Medical History  Diagnosis Date  . Hepatitis   . Chronic systolic heart failure     echo 12/11: EF 45-50%, grade 1 diast dysfxn, moderately severe AS (AVA 0.9, mean 31 mmHg), mild MR;    cath 1/12:  normal cors, AVA 1.0, peak to peak gradiet for AV 31 mmHg, mod-severe AS, EF 35-40%  . 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 Hx MRSA infection     thigh abcess 11/06  . Anxiety   . Aortic stenosis     echo 12/11: AVA 0.9, mean 31, cath 1/12: AVA 1.0 with peak to peak 31 mmHg  . HTN (hypertension)   . DM2 (diabetes mellitus, type 2)   . HLD (hyperlipidemia)   . Gout   . NICM (nonischemic cardiomyopathy)     Current Outpatient Prescriptions  Medication Sig Dispense Refill  . aspirin 81 MG tablet Take 81 mg by mouth daily.        Marland Kitchen atorvastatin (LIPITOR) 40 MG tablet take 1  tablet by mouth once daily  30 tablet  3  . carvedilol (COREG) 6.25 MG tablet Take 6.25 mg by mouth 2 (two) times daily with meals.        . fenofibrate (TRICOR) 145 MG tablet Take 145 mg by mouth daily.        . furosemide (LASIX) 40 MG tablet Take 80 mg by mouth 2 (two) times daily.       Marland Kitchen glimepiride (AMARYL) 4 MG tablet Take 4 mg by mouth 2 (two) times daily.        . indomethacin (INDOCIN) 50 MG capsule Take 50 mg by mouth 3 (three) times daily as needed.        . insulin glargine (LANTUS) 100 UNIT/ML injection Inject 12 Units into the skin at bedtime.        Marland Kitchen lisinopril (PRINIVIL,ZESTRIL) 40 MG tablet take 1 tablet by mouth once daily  30 tablet  3  . metFORMIN (GLUCOPHAGE) 1000 MG tablet Take 1,000 mg by mouth 2 (two) times daily with meals.        . sildenafil (VIAGRA) 100 MG tablet Take 100 mg by mouth daily as needed. 1 tab by mouth 30 minutes prior to sexual intercourse       . terbinafine (LAMISIL)  1 % cream Apply topically 2 (two) times daily.        Marland Kitchen DISCONTD: amLODipine (NORVASC) 10 MG tablet take 1 tablet by mouth once daily  30 tablet  3  . DISCONTD: furosemide (LASIX) 40 MG tablet take 1 tablet by mouth twice a day  60 tablet  2  . DISCONTD: pioglitazone (ACTOS) 30 MG tablet Take 30 mg by mouth daily.          Allergies: Allergies  Allergen Reactions  . Oxycodone-Aspirin     REACTION: rash    Vital Signs: BP 118/78  Pulse 76  Resp 16  Ht 6' (1.829 m)  Wt 350 lb (158.759 kg)  BMI 47.47 kg/m2  PHYSICAL EXAM: Well nourished, well developed, in no acute distress HEENT: normal Neck: no JVD At 90 Cardiac:  normal S1, Diminished S2; Regular rate and rhythm; 3/6 harsh systolic murmur heard best at the right upper sternal border and left lower sternal border Lungs:  clear to auscultation bilaterally, no wheezing, rhonchi or rales Abd: soft, nontender Ext: Trace bilateral edema Skin: warm and dry Neuro:  CNs 2-12 intact, no focal abnormalities noted  EKG:  Sinus  rhythm, heart rate 76, left axis deviation, incomplete right bundle branch block, nonspecific ST-T wave changes, no significant change when compared to prior tracings  ASSESSMENT AND PLAN:

## 2011-04-30 NOTE — Assessment & Plan Note (Addendum)
Volume stable.  Continue with fluid restriction.  Continue with salt restriction.  Continue current medications.  Check a basic metabolic panel today.  Follow up with Dr. Clifton James in 4-6 weeks.

## 2011-05-03 NOTE — Progress Notes (Signed)
Scott, I can see him back and we can set it up then. Thanks for seeing him. Thayer Ohm

## 2011-05-31 ENCOUNTER — Ambulatory Visit (INDEPENDENT_AMBULATORY_CARE_PROVIDER_SITE_OTHER): Payer: BC Managed Care – PPO | Admitting: Cardiovascular Disease

## 2011-05-31 ENCOUNTER — Encounter: Payer: Self-pay | Admitting: Cardiovascular Disease

## 2011-05-31 ENCOUNTER — Ambulatory Visit: Payer: BC Managed Care – PPO | Admitting: Cardiovascular Disease

## 2011-05-31 VITALS — BP 128/78 | HR 98 | Resp 16 | Ht 73.0 in | Wt 358.0 lb

## 2011-05-31 DIAGNOSIS — I429 Cardiomyopathy, unspecified: Secondary | ICD-10-CM

## 2011-05-31 DIAGNOSIS — I359 Nonrheumatic aortic valve disorder, unspecified: Secondary | ICD-10-CM

## 2011-05-31 NOTE — Assessment & Plan Note (Signed)
Severe AS. Will repeat echo. No clinical signs. Will discuss echo with pt and formulate plan.

## 2011-05-31 NOTE — Patient Instructions (Signed)
Your physician recommends that you schedule a follow-up appointment in: 6 months  Your physician has requested that you have an echocardiogram. Echocardiography is a painless test that uses sound waves to create images of your heart. It provides your doctor with information about the size and shape of your heart and how well your heart's chambers and valves are working. This procedure takes approximately one hour. There are no restrictions for this procedure.    

## 2011-05-31 NOTE — Progress Notes (Signed)
History of Present Illness:Antonio [name] is a 60 y.o. male who presents for follow up. He has a history of moderately severe aortic stenosis, DM2, HTN, hyperlipidemia. Cardiac catheterization done December 2011 demonstrated normal coronary arteries. He has a nonischemic cardiomyopathy with an ejection fraction of about 45%. He was admitted 6/2-6/6 with acute on chronic systolic heart failure. He improved with IV diuresis. Of note, amlodipine was held secondary to hypotension prior to discharge. Labs: Sodium 135, potassium 5.2, chloride 95, CO2 34, BUN 37, creatinine 0.91, glucose 220, hemoglobin 13.8, ALT 13, TSH 4.251, BNP 634.9 (this was down from 1518), A1c 8.0.   He is doing well. His breathing is improved. He sleeps at an incline. This is chronic without change. He denies PND. His lower extremity edema is improved. He has cut back on his fluid intake. He denies salt indiscretion. He denies chest pain or syncope. He had a recent overnight oximetry done which was abnormal. He had a formal sleep study, results pending.  He is enrolled in [employer]. He is having trouble losing weight.     Past Medical History  Diagnosis Date  . Hepatitis   . Chronic systolic heart failure     echo 12/11: EF 45-50%, grade 1 diast dysfxn, moderately severe AS (AVA 0.9, mean 31 mmHg), mild MR;    cath 1/12:  normal cors, AVA 1.0, peak to peak gradiet for AV 31 mmHg, mod-severe AS, EF 35-40%  . 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 Hx MRSA infection     thigh abcess 11/06  . Anxiety   . Aortic stenosis     echo 12/11: AVA 0.9, mean 31, cath 1/12: AVA 1.0 with peak to peak 31 mmHg  . HTN (hypertension)   . DM2 (diabetes mellitus, type 2)   . HLD (hyperlipidemia)   . Gout   . NICM (nonischemic cardiomyopathy)     Past Surgical History  Procedure Date  . Transthoracic echocardiogram 12/05    EF: 55-65% mild to moderate AS  . Cardiovascular stress test 10/10/04    ? inf. wall defect -  .  Laminectomy 10/30/83    Current Outpatient Prescriptions  Medication Sig Dispense Refill  . aspirin 81 MG tablet Take 81 mg by mouth daily.        Marland Kitchen atorvastatin (LIPITOR) 40 MG tablet take 1 tablet by mouth once daily  30 tablet  3  . carvedilol (COREG) 6.25 MG tablet Take 6.25 mg by mouth 2 (two) times daily with meals.        . fenofibrate (TRICOR) 145 MG tablet Take 145 mg by mouth daily.        . furosemide (LASIX) 40 MG tablet Take 80 mg by mouth 2 (two) times daily.       Marland Kitchen glimepiride (AMARYL) 4 MG tablet Take 4 mg by mouth 2 (two) times daily.        . indomethacin (INDOCIN) 50 MG capsule Take 50 mg by mouth 3 (three) times daily as needed.        . insulin glargine (LANTUS) 100 UNIT/ML injection Inject 12 Units into the skin at bedtime.        Marland Kitchen lisinopril (PRINIVIL,ZESTRIL) 40 MG tablet take 1 tablet by mouth once daily  30 tablet  3  . metFORMIN (GLUCOPHAGE) 1000 MG tablet Take 1,000 mg by mouth 2 (two) times daily with meals.        . sildenafil (VIAGRA) 100 MG  tablet Take 100 mg by mouth daily as needed. 1 tab by mouth 30 minutes prior to sexual intercourse         Allergies  Allergen Reactions  . Oxycodone-Aspirin     REACTION: rash    History   Social History  . Marital Status: Married    Spouse Name: N/A    Number of Children: N/A  . Years of Education: N/A   Occupational History  . retired travelling Engineer, site    Social History Main Topics  . Smoking status: Never Smoker   . Smokeless tobacco: Not on file  . Alcohol Use: Not on file  . Drug Use: Not on file  . Sexually Active: Not on file   Other Topics Concern  . Not on file   Social History Narrative  . No narrative on file    Family History  Problem Relation Age of Onset  . Heart attack Father     Review of Systems:  As stated in the HPI and otherwise negative.   BP 128/78  Pulse 98  Resp 16  Ht 6\' 1"  (1.854 m)  Wt 358 lb (162.388 kg)  BMI 47.23 kg/m2  Physical  Examination: General: Well developed, well nourished, NAD HEENT: OP clear, mucus membranes moist SKIN: warm, dry. No rashes. Neuro: No focal deficits Musculoskeletal: Muscle strength 5/5 all ext Psychiatric: Mood and affect normal Neck: No JVD, no carotid bruits, no thyromegaly, no lymphadenopathy. Lungs:Clear bilaterally, no wheezes, rhonci, crackles Cardiovascular: Regular rate and rhythm. Loud systolic murmur, No gallops or rubs. Abdomen:Soft. Bowel sounds present. Non-tender.  Extremities: No lower extremity edema. Pulses are 2 + in the bilateral DP/PT.

## 2011-05-31 NOTE — Assessment & Plan Note (Signed)
Stable. Continue medical management. 

## 2011-06-04 ENCOUNTER — Encounter: Payer: Self-pay | Admitting: *Deleted

## 2011-06-11 ENCOUNTER — Other Ambulatory Visit (HOSPITAL_COMMUNITY): Payer: Self-pay | Admitting: Cardiology

## 2011-06-11 ENCOUNTER — Ambulatory Visit (HOSPITAL_COMMUNITY): Payer: BC Managed Care – PPO | Attending: Cardiovascular Disease | Admitting: Radiology

## 2011-06-11 DIAGNOSIS — I509 Heart failure, unspecified: Secondary | ICD-10-CM | POA: Insufficient documentation

## 2011-06-11 DIAGNOSIS — I10 Essential (primary) hypertension: Secondary | ICD-10-CM | POA: Insufficient documentation

## 2011-06-11 DIAGNOSIS — E119 Type 2 diabetes mellitus without complications: Secondary | ICD-10-CM | POA: Insufficient documentation

## 2011-06-11 DIAGNOSIS — I079 Rheumatic tricuspid valve disease, unspecified: Secondary | ICD-10-CM | POA: Insufficient documentation

## 2011-06-11 DIAGNOSIS — I359 Nonrheumatic aortic valve disorder, unspecified: Secondary | ICD-10-CM | POA: Insufficient documentation

## 2011-06-11 DIAGNOSIS — I428 Other cardiomyopathies: Secondary | ICD-10-CM | POA: Insufficient documentation

## 2011-06-11 DIAGNOSIS — I319 Disease of pericardium, unspecified: Secondary | ICD-10-CM | POA: Insufficient documentation

## 2011-06-11 DIAGNOSIS — E785 Hyperlipidemia, unspecified: Secondary | ICD-10-CM | POA: Insufficient documentation

## 2011-06-11 DIAGNOSIS — I350 Nonrheumatic aortic (valve) stenosis: Secondary | ICD-10-CM

## 2011-06-11 DIAGNOSIS — I517 Cardiomegaly: Secondary | ICD-10-CM | POA: Insufficient documentation

## 2011-06-11 DIAGNOSIS — I5020 Unspecified systolic (congestive) heart failure: Secondary | ICD-10-CM | POA: Insufficient documentation

## 2011-06-11 MED ORDER — PERFLUTREN PROTEIN A MICROSPH IV SUSP
3.0000 mL | Freq: Once | INTRAVENOUS | Status: AC
  Administered 2011-06-11: 3 mL via INTRAVENOUS

## 2011-06-13 ENCOUNTER — Telehealth: Payer: Self-pay | Admitting: Cardiovascular Disease

## 2011-06-13 NOTE — Telephone Encounter (Signed)
Attempted to call pt to discuss results of echo. Will try to return call later today. cdm

## 2011-06-15 ENCOUNTER — Telehealth: Payer: Self-pay | Admitting: Cardiovascular Disease

## 2011-06-15 NOTE — Telephone Encounter (Signed)
Spoke to pt about echo results. I think he needs referral for surgery (AVR). He will think about this and call us back. He has someone in mind at [facility]. Will discuss further next week. cdm

## 2011-11-07 ENCOUNTER — Encounter: Payer: Self-pay | Admitting: Cardiovascular Disease

## 2011-11-07 ENCOUNTER — Ambulatory Visit (INDEPENDENT_AMBULATORY_CARE_PROVIDER_SITE_OTHER): Payer: BC Managed Care – PPO | Admitting: Cardiovascular Disease

## 2011-11-07 VITALS — BP 126/90 | HR 80 | Ht 71.0 in | Wt 366.0 lb

## 2011-11-07 DIAGNOSIS — I350 Nonrheumatic aortic (valve) stenosis: Secondary | ICD-10-CM

## 2011-11-07 DIAGNOSIS — I359 Nonrheumatic aortic valve disorder, unspecified: Secondary | ICD-10-CM

## 2011-11-07 DIAGNOSIS — G4730 Sleep apnea, unspecified: Secondary | ICD-10-CM

## 2011-11-07 MED ORDER — FENOFIBRATE 145 MG PO TABS: 145.0000 mg | ORAL_TABLET | Freq: Every day | ORAL | Status: DC

## 2011-11-07 MED ORDER — LISINOPRIL 40 MG PO TABS: 40.0000 mg | ORAL_TABLET | Freq: Every day | ORAL | Status: DC

## 2011-11-07 MED ORDER — FUROSEMIDE 80 MG PO TABS: 80.0000 mg | ORAL_TABLET | Freq: Two times a day (BID) | ORAL | Status: DC

## 2011-11-07 MED ORDER — ATORVASTATIN CALCIUM 40 MG PO TABS: 40.0000 mg | ORAL_TABLET | Freq: Every day | ORAL | Status: DC

## 2011-11-07 MED ORDER — CARVEDILOL 6.25 MG PO TABS: 6.2500 mg | ORAL_TABLET | Freq: Two times a day (BID) | ORAL | Status: DC

## 2011-11-07 NOTE — Progress Notes (Signed)
History of Present Illness: Antonio [name] is a 61 y.o. male who presents for cardiac follow up. He has a history of moderately severe aortic stenosis, DM2, HTN, hyperlipidemia. Cardiac catheterization done December 2011 demonstrated normal coronary arteries. He has a nonischemic cardiomyopathy with an ejection fraction of about 45%. He was admitted 6/2-6/6 with acute on chronic systolic heart failure. He improved with IV diuresis.  I last saw him in August 2012. He has been trying to lose weight. Sleep study done this fall and positive for sleep apnea. He has not been compliant . His breathing is improved. He sleeps at an incline. This is chronic without change. He denies PND. His lower extremity edema is improved. He has cut back on his fluid intake. He denies salt indiscretion. He denies chest pain or syncope. He notes daytime somnolence and fatigue. He does note dyspnea with minimal exertion. Overall he is not doing as well has he had been. He did miss his Lasix for a few days but is now back on it.    Past Medical History  Diagnosis Date  . Hepatitis   . Chronic systolic heart failure     echo 12/11: EF 45-50%, grade 1 diast dysfxn, moderately severe AS (AVA 0.9, mean 31 mmHg), mild MR;    cath 1/12:  normal cors, AVA 1.0, peak to peak gradiet for AV 31 mmHg, mod-severe AS, EF 35-40%  . 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 Hx MRSA infection     thigh abcess 11/06  . Anxiety   . Aortic stenosis     echo 12/11: AVA 0.9, mean 31, cath 1/12: AVA 1.0 with peak to peak 31 mmHg  . HTN (hypertension)   . DM2 (diabetes mellitus, type 2)   . HLD (hyperlipidemia)   . Gout   . NICM (nonischemic cardiomyopathy)     Past Surgical History  Procedure Date  . Transthoracic echocardiogram 12/05    EF: 55-65% mild to moderate AS  . Cardiovascular stress test 10/10/04    ? inf. wall defect -  . Laminectomy 10/30/83    Current Outpatient Prescriptions  Medication Sig Dispense Refill  .  aspirin 81 MG tablet Take 81 mg by mouth daily.        Marland Kitchen atorvastatin (LIPITOR) 40 MG tablet take 1 tablet by mouth once daily  30 tablet  3  . carvedilol (COREG) 6.25 MG tablet Take 6.25 mg by mouth 2 (two) times daily with meals.        . fenofibrate (TRICOR) 145 MG tablet Take 145 mg by mouth daily.        . furosemide (LASIX) 40 MG tablet Take 80 mg by mouth 2 (two) times daily.       Marland Kitchen glimepiride (AMARYL) 4 MG tablet Take 4 mg by mouth 2 (two) times daily.        . indomethacin (INDOCIN) 50 MG capsule Take 50 mg by mouth 3 (three) times daily as needed.        . insulin glargine (LANTUS) 100 UNIT/ML injection Inject 12 Units into the skin at bedtime.        Marland Kitchen lisinopril (PRINIVIL,ZESTRIL) 40 MG tablet take 1 tablet by mouth once daily  30 tablet  3  . metFORMIN (GLUCOPHAGE) 1000 MG tablet Take 1,000 mg by mouth 2 (two) times daily with meals.          Allergies  Allergen Reactions  . Oxycodone-Aspirin  REACTION: rash    History   Social History  . Marital Status: Married    Spouse Name: N/A    Number of Children: N/A  . Years of Education: N/A   Occupational History  . retired travelling Engineer, site    Social History Main Topics  . Smoking status: Never Smoker   . Smokeless tobacco: Not on file  . Alcohol Use: Not on file  . Drug Use: Not on file  . Sexually Active: Not on file   Other Topics Concern  . Not on file   Social History Narrative  . No narrative on file    Family History  Problem Relation Age of Onset  . Heart attack Father     Review of Systems:  As stated in the HPI and otherwise negative.   BP 126/90  Pulse 80  Ht 5\' 11"  (1.803 m)  Wt 366 lb (166.017 kg)  BMI 51.05 kg/m2  Physical Examination: General: Well developed, well nourished, NAD HEENT: OP clear, mucus membranes moist SKIN: warm, dry. No rashes. Neuro: No focal deficits Musculoskeletal: Muscle strength 5/5 all ext Psychiatric: Mood and affect normal Neck: No JVD, no  carotid bruits, no thyromegaly, no lymphadenopathy. Lungs:Clear bilaterally, no wheezes, rhonci, crackles Cardiovascular: Regular rate and rhythm. No murmurs, gallops or rubs. Abdomen:Soft. Bowel sounds present. Non-tender.  Extremities: No lower extremity edema. Pulses are 2 + in the bilateral DP/PT.

## 2011-11-07 NOTE — Assessment & Plan Note (Addendum)
He has moderately severe AS by echo in December 2011. Cath January 2012 with AVA of 1.0cm2. His symptoms have now changed with more dyspnea however he is morbidly obese and deconditioned. Will repeat echo to assess severity of AV. He may need referral soon for AVR.

## 2011-11-07 NOTE — Patient Instructions (Signed)
Your physician recommends that you schedule a follow-up appointment in: 3 months  Your physician has requested that you have an echocardiogram. Echocardiography is a painless test that uses sound waves to create images of your heart. It provides your doctor with information about the size and shape of your heart and how well your heart's chambers and valves are working. This procedure takes approximately one hour. There are no restrictions for this procedure.   

## 2011-11-07 NOTE — Assessment & Plan Note (Addendum)
He is encouraged to use his CPAP. I think his daytime somnolence and fatigue is likely from his OSA and non-compliance with CPAP.

## 2011-11-07 NOTE — Progress Notes (Signed)
Addended by: Dossie Arbour on: 11/07/2011 02:31 PM   Modules accepted: Orders

## 2011-11-13 ENCOUNTER — Other Ambulatory Visit (HOSPITAL_COMMUNITY): Payer: Self-pay | Admitting: Cardiovascular Disease

## 2011-11-13 ENCOUNTER — Encounter (HOSPITAL_COMMUNITY): Payer: Self-pay

## 2011-11-13 ENCOUNTER — Other Ambulatory Visit: Payer: Self-pay

## 2011-11-13 ENCOUNTER — Ambulatory Visit (INDEPENDENT_AMBULATORY_CARE_PROVIDER_SITE_OTHER): Payer: BC Managed Care – PPO | Admitting: Cardiology

## 2011-11-13 ENCOUNTER — Ambulatory Visit (HOSPITAL_BASED_OUTPATIENT_CLINIC_OR_DEPARTMENT_OTHER): Payer: BC Managed Care – PPO

## 2011-11-13 ENCOUNTER — Encounter: Payer: Self-pay | Admitting: Cardiology

## 2011-11-13 ENCOUNTER — Inpatient Hospital Stay (HOSPITAL_COMMUNITY)
Admission: EM | Admit: 2011-11-13 | Discharge: 2011-12-17 | DRG: 545 | Disposition: A | Discharge status: Skilled Nursing Facility | Payer: BC Managed Care – PPO | Attending: Cardiothoracic Surgery | Admitting: Cardiothoracic Surgery

## 2011-11-13 DIAGNOSIS — K219 Gastro-esophageal reflux disease without esophagitis: Secondary | ICD-10-CM | POA: Diagnosis present

## 2011-11-13 DIAGNOSIS — R110 Nausea: Secondary | ICD-10-CM | POA: Diagnosis not present

## 2011-11-13 DIAGNOSIS — Z6841 Body Mass Index (BMI) 40.0 and over, adult: Secondary | ICD-10-CM

## 2011-11-13 DIAGNOSIS — E662 Morbid (severe) obesity with alveolar hypoventilation: Secondary | ICD-10-CM | POA: Diagnosis present

## 2011-11-13 DIAGNOSIS — J9600 Acute respiratory failure, unspecified whether with hypoxia or hypercapnia: Secondary | ICD-10-CM | POA: Diagnosis present

## 2011-11-13 DIAGNOSIS — N179 Acute kidney failure, unspecified: Secondary | ICD-10-CM | POA: Diagnosis present

## 2011-11-13 DIAGNOSIS — I871 Compression of vein: Secondary | ICD-10-CM | POA: Diagnosis present

## 2011-11-13 DIAGNOSIS — D62 Acute posthemorrhagic anemia: Secondary | ICD-10-CM | POA: Diagnosis not present

## 2011-11-13 DIAGNOSIS — T462X5A Adverse effect of other antidysrhythmic drugs, initial encounter: Secondary | ICD-10-CM | POA: Diagnosis not present

## 2011-11-13 DIAGNOSIS — R0602 Shortness of breath: Secondary | ICD-10-CM

## 2011-11-13 DIAGNOSIS — IMO0001 Reserved for inherently not codable concepts without codable children: Secondary | ICD-10-CM | POA: Diagnosis present

## 2011-11-13 DIAGNOSIS — I480 Paroxysmal atrial fibrillation: Secondary | ICD-10-CM

## 2011-11-13 DIAGNOSIS — I5043 Acute on chronic combined systolic (congestive) and diastolic (congestive) heart failure: Secondary | ICD-10-CM | POA: Diagnosis present

## 2011-11-13 DIAGNOSIS — Z794 Long term (current) use of insulin: Secondary | ICD-10-CM

## 2011-11-13 DIAGNOSIS — I359 Nonrheumatic aortic valve disorder, unspecified: Secondary | ICD-10-CM

## 2011-11-13 DIAGNOSIS — M109 Gout, unspecified: Secondary | ICD-10-CM | POA: Diagnosis present

## 2011-11-13 DIAGNOSIS — Z91199 Patient's noncompliance with other medical treatment and regimen due to unspecified reason: Secondary | ICD-10-CM

## 2011-11-13 DIAGNOSIS — K5900 Constipation, unspecified: Secondary | ICD-10-CM | POA: Diagnosis not present

## 2011-11-13 DIAGNOSIS — I428 Other cardiomyopathies: Secondary | ICD-10-CM | POA: Diagnosis present

## 2011-11-13 DIAGNOSIS — I10 Essential (primary) hypertension: Secondary | ICD-10-CM | POA: Diagnosis present

## 2011-11-13 DIAGNOSIS — I350 Nonrheumatic aortic (valve) stenosis: Secondary | ICD-10-CM | POA: Diagnosis present

## 2011-11-13 DIAGNOSIS — R0989 Other specified symptoms and signs involving the circulatory and respiratory systems: Secondary | ICD-10-CM

## 2011-11-13 DIAGNOSIS — E785 Hyperlipidemia, unspecified: Secondary | ICD-10-CM | POA: Diagnosis present

## 2011-11-13 DIAGNOSIS — Z9119 Patient's noncompliance with other medical treatment and regimen: Secondary | ICD-10-CM

## 2011-11-13 DIAGNOSIS — I4891 Unspecified atrial fibrillation: Secondary | ICD-10-CM | POA: Diagnosis not present

## 2011-11-13 DIAGNOSIS — F3289 Other specified depressive episodes: Secondary | ICD-10-CM | POA: Diagnosis present

## 2011-11-13 DIAGNOSIS — F329 Major depressive disorder, single episode, unspecified: Secondary | ICD-10-CM | POA: Diagnosis present

## 2011-11-13 DIAGNOSIS — E669 Obesity, unspecified: Secondary | ICD-10-CM | POA: Diagnosis present

## 2011-11-13 DIAGNOSIS — E8779 Other fluid overload: Secondary | ICD-10-CM | POA: Diagnosis not present

## 2011-11-13 DIAGNOSIS — Z7982 Long term (current) use of aspirin: Secondary | ICD-10-CM

## 2011-11-13 DIAGNOSIS — I509 Heart failure, unspecified: Secondary | ICD-10-CM | POA: Diagnosis present

## 2011-11-13 DIAGNOSIS — I5023 Acute on chronic systolic (congestive) heart failure: Secondary | ICD-10-CM

## 2011-11-13 DIAGNOSIS — G4733 Obstructive sleep apnea (adult) (pediatric): Secondary | ICD-10-CM | POA: Diagnosis present

## 2011-11-13 DIAGNOSIS — Z886 Allergy status to analgesic agent status: Secondary | ICD-10-CM

## 2011-11-13 DIAGNOSIS — Z8619 Personal history of other infectious and parasitic diseases: Secondary | ICD-10-CM

## 2011-11-13 DIAGNOSIS — Z79899 Other long term (current) drug therapy: Secondary | ICD-10-CM

## 2011-11-13 DIAGNOSIS — H5440 Blindness, one eye, unspecified eye: Secondary | ICD-10-CM | POA: Diagnosis present

## 2011-11-13 LAB — GLUCOSE, CAPILLARY
Glucose-Capillary: 145 mg/dL — ABNORMAL HIGH (ref 70–99)
Glucose-Capillary: 149 mg/dL — ABNORMAL HIGH (ref 70–99)

## 2011-11-13 LAB — CBC
MCH: 27.9 pg (ref 26.0–34.0)
MCHC: 32.6 g/dL (ref 30.0–36.0)
MCV: 85.5 fL (ref 78.0–100.0)
Platelets: 398 10*3/uL (ref 150–400)
RBC: 4.63 MIL/uL (ref 4.22–5.81)

## 2011-11-13 LAB — PROTIME-INR
INR: 1.44 (ref 0.00–1.49)
Prothrombin Time: 17.8 seconds — ABNORMAL HIGH (ref 11.6–15.2)

## 2011-11-13 MED ORDER — SODIUM CHLORIDE 0.9 % IV SOLN: 250.0000 mL | INTRAVENOUS | Status: DC | PRN

## 2011-11-13 MED ORDER — INSULIN ASPART 100 UNIT/ML ~~LOC~~ SOLN
0.0000 [IU] | Freq: Every day | SUBCUTANEOUS | Status: DC
  Filled 2011-11-13: qty 1

## 2011-11-13 MED ORDER — INSULIN ASPART 100 UNIT/ML ~~LOC~~ SOLN
0.0000 [IU] | Freq: Three times a day (TID) | SUBCUTANEOUS | Status: DC
  Administered 2011-11-14: 5 [IU] via SUBCUTANEOUS
  Administered 2011-11-14 – 2011-11-15 (×2): 2 [IU] via SUBCUTANEOUS
  Administered 2011-11-18: 3 [IU] via SUBCUTANEOUS
  Administered 2011-11-18: 5 [IU] via SUBCUTANEOUS
  Administered 2011-11-19: 2 [IU] via SUBCUTANEOUS
  Administered 2011-11-20: 3 [IU] via SUBCUTANEOUS
  Administered 2011-11-21 – 2011-11-22 (×3): 2 [IU] via SUBCUTANEOUS
  Administered 2011-11-22: 3 [IU] via SUBCUTANEOUS
  Administered 2011-11-23 – 2011-11-24 (×2): 2 [IU] via SUBCUTANEOUS
  Administered 2011-11-24 – 2011-11-27 (×2): 3 [IU] via SUBCUTANEOUS
  Filled 2011-11-13 (×3): qty 3

## 2011-11-13 MED ORDER — ONDANSETRON HCL 4 MG/2ML IJ SOLN: 4.0000 mg | Freq: Four times a day (QID) | INTRAMUSCULAR | Status: DC | PRN

## 2011-11-13 MED ORDER — WHITE PETROLATUM GEL
Status: AC
  Administered 2011-11-14: 1
  Filled 2011-11-13: qty 5

## 2011-11-13 MED ORDER — SODIUM CHLORIDE 0.9 % IJ SOLN: 3.0000 mL | INTRAMUSCULAR | Status: DC | PRN

## 2011-11-13 MED ORDER — FUROSEMIDE 10 MG/ML IJ SOLN
80.0000 mg | Freq: Three times a day (TID) | INTRAMUSCULAR | Status: DC
  Administered 2011-11-13 – 2011-11-16 (×9): 80 mg via INTRAVENOUS
  Filled 2011-11-13 (×8): qty 8
  Filled 2011-11-13: qty 4
  Filled 2011-11-13 (×4): qty 8

## 2011-11-13 MED ORDER — ENOXAPARIN SODIUM 40 MG/0.4ML ~~LOC~~ SOLN
40.0000 mg | Freq: Every day | SUBCUTANEOUS | Status: DC
  Administered 2011-11-14: 40 mg via SUBCUTANEOUS
  Filled 2011-11-13 (×2): qty 0.4

## 2011-11-13 MED ORDER — PERFLUTREN PROTEIN A MICROSPH IV SUSP
2.0000 mL | Freq: Once | INTRAVENOUS | Status: AC
  Administered 2011-11-13: 2 mL via INTRAVENOUS

## 2011-11-13 MED ORDER — ZOLPIDEM TARTRATE 5 MG PO TABS
5.0000 mg | ORAL_TABLET | Freq: Every evening | ORAL | Status: DC | PRN
  Administered 2011-11-18: 5 mg via ORAL
  Filled 2011-11-13: qty 1

## 2011-11-13 MED ORDER — ACETAMINOPHEN 325 MG PO TABS
650.0000 mg | ORAL_TABLET | ORAL | Status: DC | PRN
  Administered 2011-11-18: 650 mg via ORAL
  Filled 2011-11-13: qty 2

## 2011-11-13 MED ORDER — SODIUM CHLORIDE 0.9 % IJ SOLN
3.0000 mL | Freq: Two times a day (BID) | INTRAMUSCULAR | Status: DC
  Administered 2011-11-14 – 2011-11-19 (×13): 3 mL via INTRAVENOUS

## 2011-11-13 MED ORDER — PERFLUTREN PROTEIN A MICROSPH IV SUSP: 2.0000 mL | Freq: Once | INTRAVENOUS | Status: DC

## 2011-11-13 MED ORDER — ALPRAZOLAM 0.25 MG PO TABS
0.2500 mg | ORAL_TABLET | Freq: Two times a day (BID) | ORAL | Status: DC | PRN
  Administered 2011-11-17: 0.25 mg via ORAL
  Filled 2011-11-13: qty 1

## 2011-11-13 NOTE — ED Notes (Signed)
Triage note by Ellin Goodie, RN

## 2011-11-13 NOTE — ED Notes (Signed)
Glucose 67.

## 2011-11-13 NOTE — Progress Notes (Signed)
HPI [name] is a 61 year old married white male who came to the office today for echocardiogram to followup on his severe aortic stenosis.  The last 2 nights, he was not able to  lie down. He is very short of breath. He was so short of breath yesterday he could carry on a conversation.  During his Echo, he could not lie down and his 02 sat was 81%. We were able to get up to 92% on 3 L.  He denies any chest discomfort, palpitations, presyncope or syncope. He has chronic he sleep apnea and has not been compliant with his CPAP. He has a lot of difficulty lying down at night in general.  Please see Dr. Weldon Picking note from January 9.  Past Medical History  Diagnosis Date  . Hepatitis   . Chronic systolic heart failure     echo 12/11: EF 45-50%, grade 1 diast dysfxn, moderately severe AS (AVA 0.9, mean 31 mmHg), mild MR;    cath 1/12:  normal cors, AVA 1.0, peak to peak gradiet for AV 31 mmHg, mod-severe AS, EF 35-40%  . 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 Hx MRSA infection     thigh abcess 11/06  . Anxiety   . Aortic stenosis     echo 12/11: AVA 0.9, mean 31, cath 1/12: AVA 1.0 with peak to peak 31 mmHg  . HTN (hypertension)   . DM2 (diabetes mellitus, type 2)   . HLD (hyperlipidemia)   . Gout   . NICM (nonischemic cardiomyopathy)     Current Outpatient Prescriptions  Medication Sig Dispense Refill  . aspirin 81 MG tablet Take 81 mg by mouth daily.        Marland Kitchen atorvastatin (LIPITOR) 40 MG tablet Take 1 tablet (40 mg total) by mouth daily.  90 tablet  3  . carvedilol (COREG) 6.25 MG tablet Take 1 tablet (6.25 mg total) by mouth 2 (two) times daily with a meal.  180 tablet  3  . fenofibrate (TRICOR) 145 MG tablet Take 1 tablet (145 mg total) by mouth daily.  90 tablet  3  . furosemide (LASIX) 80 MG tablet Take 1 tablet (80 mg total) by mouth 2 (two) times daily.  180 tablet  3  . glimepiride (AMARYL) 4 MG tablet Take 4 mg by mouth 2 (two) times daily.        . indomethacin  (INDOCIN) 50 MG capsule Take 50 mg by mouth 3 (three) times daily as needed.        . insulin glargine (LANTUS) 100 UNIT/ML injection Inject 12 Units into the skin at bedtime.        Marland Kitchen lisinopril (PRINIVIL,ZESTRIL) 40 MG tablet Take 1 tablet (40 mg total) by mouth daily.  90 tablet  3  . metFORMIN (GLUCOPHAGE) 1000 MG tablet Take 1,000 mg by mouth 2 (two) times daily with meals.         No current facility-administered medications for this visit.   Facility-Administered Medications Ordered in Other Visits  Medication Dose Route Frequency Provider Last Rate Last Dose  . perflutren protein A microspheres (OPTISON) injection 2 mL  2 mL Intravenous Once [name], MD        Allergies  Allergen Reactions  . Oxycodone-Aspirin     REACTION: rash    Family History  Problem Relation Age of Onset  . Heart attack Father     History   Social History  . Marital Status: Married  Spouse Name: N/A    Number of Children: N/A  . Years of Education: N/A   Occupational History  . retired travelling Engineer, site    Social History Main Topics  . Smoking status: Never Smoker   . Smokeless tobacco: Not on file  . Alcohol Use: Not on file  . Drug Use: Not on file  . Sexually Active: Not on file   Other Topics Concern  . Not on file   Social History Narrative  . No narrative on file    ROS ALL NEGATIVE EXCEPT THOSE NOTED IN HPI  PE  General Appearance: well developed, he is morbidly obese, dyspneic at rest HEENT: symmetrical face, PERRLA, good dentition  Neck: no JVD, thyromegaly, or adenopathy, trachea midline Chest: symmetric without deformity, subcutaneous back edema Cardiac: PMI poorly appreciated RRR, normal S1, S2, no gallop, 3/6 aortic stenosis murmur, Lung: clear to ausculation and percussion Vascular: all pulses full without bruits  Abdominal: distended, morbidly obese, Extremities: no cyanosis, clubbiing, 4+ edema, no sign of DVT, no varicosities  Skin: normal  color, no rashes Neuro: alert and oriented x 3, non-focal Pysch: normal affect  EKG  BMET    Component Value Date/Time   NA 141 04/30/2011 1109   K 4.0 04/30/2011 1109   CL 101 04/30/2011 1109   CO2 31 04/30/2011 1109   GLUCOSE 83 04/30/2011 1109   BUN 32* 04/30/2011 1109   CREATININE 1.1 04/30/2011 1109   CALCIUM 9.3 04/30/2011 1109   GFRNONAA >60 04/04/2011 0330   GFRAA  Value: >60        The eGFR has been calculated using the MDRD equation. This calculation has not been validated in all clinical situations. eGFR's persistently <60 mL/min signify possible Chronic Kidney Disease. 04/04/2011 0330    Lipid Panel     Component Value Date/Time   CHOL 195 05/19/2010 1915   TRIG 254* 05/19/2010 1915   HDL 35* 05/19/2010 1915   CHOLHDL 5.6 Ratio 05/19/2010 1915   VLDL 51* 05/19/2010 1915   LDLCALC 109* 05/19/2010 1915    CBC    Component Value Date/Time   WBC 11.3* 04/01/2011 0635   RBC 5.06 04/01/2011 0635   HGB 13.8 04/01/2011 0635   HCT 42.0 04/01/2011 0635   PLT 297 04/01/2011 0635   MCV 83.0 04/01/2011 0635   MCH 27.3 04/01/2011 0635   MCHC 32.9 04/01/2011 0635   RDW 13.7 04/01/2011 0635   LYMPHSABS 2.5 04/01/2011 0635   MONOABS 0.9 04/01/2011 0635   EOSABS 0.2 04/01/2011 0635   BASOSABS 0.0 04/01/2011 1191

## 2011-11-13 NOTE — ED Notes (Signed)
Pt presents to department via [facility] for evaluation of SOB. Was at [facility] today, 80% room air in office. Sent here for further evaluation. Denies chest pain. Respirations unlabored. Lung sounds clear per EMS. Pt alert and oriented x4. No signs of distress upon arrival to ED.

## 2011-11-13 NOTE — Assessment & Plan Note (Signed)
He will be admitted to the hospital for IV diuresis with Lasix 80 mg q.8 hours. He will be placed in the step down unit for close observation. Follow daily electrolytes. Check final echo report. Once stabilized, he will most likely need reassessment of his aortic stenosis and perhaps surgery. He is a high-risk candidate with his multiple comorbidities including morbid obesity.  I discussed the plan with he and his wife. They understand the necessity of hospitalization. He was transported by EMS with oxygen and telemetry. I have called the hospital and spoken with our admitting team. They will acquire a step down bed and write orders. Metformin should probably be held until we decide further diagnostic studies.

## 2011-11-13 NOTE — Progress Notes (Signed)
Spoke with patient and wrote orders as Dr Daleen Squibb requested. Pt is still SOB but O2sats are being maintained on n/c oxygen with sats of 90%. He is not having chest pain.   He does not weigh himself daily. He admits to dietary indiscretions as well as possible binge eating secondary to emotional issues or stress. He is encouraged to pursue treatment for these. We will obtain a nutrition consult and he is encouraged to by a scale and weigh daily at discharge.  Treatment plan was discussed with the patient as well as his wife.Patient examined and agree except changes made.  Antonio Castle, MD 11/13/2011 4:36 PM  is wife are aware. ER staff made aware of admission as well.

## 2011-11-13 NOTE — ED Notes (Signed)
Received report assumed patient care , walking round completed.

## 2011-11-13 NOTE — ED Notes (Signed)
Called yellow to give report and spoke with Sedalia Muta, RN. Pt prepared for transport.

## 2011-11-13 NOTE — ED Notes (Signed)
Called flow manager Joyce Gross to inquire about pt's status as far as admission and what needs to be done to accommodate the pt for treatment.

## 2011-11-13 NOTE — ED Notes (Signed)
Awaiting available room.  Pt a/o x4, ND.  No verbal complaints at this time.

## 2011-11-14 LAB — BASIC METABOLIC PANEL
BUN: 21 mg/dL (ref 6–23)
CO2: 36 mEq/L — ABNORMAL HIGH (ref 19–32)
CO2: 34 mEq/L — ABNORMAL HIGH (ref 19–32)
Calcium: 9.7 mg/dL (ref 8.4–10.5)
Calcium: 9.8 mg/dL (ref 8.4–10.5)
Chloride: 97 mEq/L (ref 96–112)
Creatinine, Ser: 1.07 mg/dL (ref 0.50–1.35)
GFR calc Af Amer: 89 mL/min — ABNORMAL LOW (ref >90)
Glucose, Bld: 162 mg/dL — ABNORMAL HIGH (ref 70–99)
Sodium: 141 mEq/L (ref 135–145)
Sodium: 140 mEq/L (ref 135–145)

## 2011-11-14 LAB — GLUCOSE, CAPILLARY
Glucose-Capillary: 142 mg/dL — ABNORMAL HIGH (ref 70–99)
Glucose-Capillary: 238 mg/dL — ABNORMAL HIGH (ref 70–99)
Glucose-Capillary: 133 mg/dL — ABNORMAL HIGH (ref 70–99)

## 2011-11-14 LAB — MRSA PCR SCREENING: MRSA by PCR: NEGATIVE

## 2011-11-14 LAB — MAGNESIUM: Magnesium: 1.9 mg/dL (ref 1.5–2.5)

## 2011-11-14 MED ORDER — INSULIN GLARGINE 100 UNIT/ML ~~LOC~~ SOLN
60.0000 [IU] | Freq: Every day | SUBCUTANEOUS | Status: DC
  Administered 2011-11-14 – 2011-11-15 (×2): 60 [IU] via SUBCUTANEOUS
  Filled 2011-11-14: qty 3

## 2011-11-14 MED ORDER — PRO-STAT SUGAR FREE PO LIQD
30.0000 mL | Freq: Two times a day (BID) | ORAL | Status: DC
  Administered 2011-11-14 – 2011-11-16 (×3): 30 mL via ORAL
  Filled 2011-11-14 (×5): qty 30

## 2011-11-14 MED ORDER — ENOXAPARIN SODIUM 80 MG/0.8ML ~~LOC~~ SOLN
80.0000 mg | Freq: Every day | SUBCUTANEOUS | Status: AC
  Administered 2011-11-14 – 2011-11-27 (×14): 80 mg via SUBCUTANEOUS
  Filled 2011-11-14 (×14): qty 0.8

## 2011-11-14 MED ORDER — METFORMIN HCL 500 MG PO TABS
1000.0000 mg | ORAL_TABLET | Freq: Two times a day (BID) | ORAL | Status: DC
  Administered 2011-11-14 – 2011-11-15 (×3): 1000 mg via ORAL
  Filled 2011-11-14 (×7): qty 2

## 2011-11-14 MED ORDER — LISINOPRIL 40 MG PO TABS
40.0000 mg | ORAL_TABLET | Freq: Every day | ORAL | Status: DC
  Administered 2011-11-14: 40 mg via ORAL
  Filled 2011-11-14 (×3): qty 1

## 2011-11-14 MED ORDER — FENOFIBRATE 160 MG PO TABS
160.0000 mg | ORAL_TABLET | Freq: Every day | ORAL | Status: DC
  Administered 2011-11-14 – 2011-12-17 (×31): 160 mg via ORAL
  Filled 2011-11-14 (×35): qty 1

## 2011-11-14 MED ORDER — ASPIRIN EC 81 MG PO TBEC
81.0000 mg | DELAYED_RELEASE_TABLET | Freq: Every day | ORAL | Status: DC
  Administered 2011-11-14 – 2011-11-28 (×15): 81 mg via ORAL
  Filled 2011-11-14 (×17): qty 1

## 2011-11-14 MED ORDER — CARVEDILOL 6.25 MG PO TABS
6.2500 mg | ORAL_TABLET | Freq: Two times a day (BID) | ORAL | Status: DC
  Administered 2011-11-14 – 2011-11-21 (×14): 6.25 mg via ORAL
  Filled 2011-11-14 (×17): qty 1

## 2011-11-14 MED ORDER — GLIMEPIRIDE 4 MG PO TABS
4.0000 mg | ORAL_TABLET | Freq: Two times a day (BID) | ORAL | Status: DC
  Administered 2011-11-14 – 2011-11-28 (×26): 4 mg via ORAL
  Filled 2011-11-14 (×32): qty 1

## 2011-11-14 MED ORDER — ROSUVASTATIN CALCIUM 20 MG PO TABS
20.0000 mg | ORAL_TABLET | Freq: Every day | ORAL | Status: DC
  Administered 2011-11-14 – 2011-12-16 (×31): 20 mg via ORAL
  Filled 2011-11-14 (×34): qty 1

## 2011-11-14 NOTE — Progress Notes (Addendum)
INITIAL ADULT NUTRITION ASSESSMENT Date: 11/14/2011   Time: 10:13 AM  Reason for Assessment: MD Consult/Nutrition Risk Per chart, pt does not weigh himself daily, admits to dietary indiscretions as well as possible binge eating secondary to emotional issues or stress.  ASSESSMENT: [name]  Dx: SOB  Hx: Severe aortic stenosis, chronic sleep apnea-noncompliant with CPAP, chronic systolic heart failure, hepatitis, blindness of right eye, MRSA, anxiety, HTN, DM2, HLD, Gout, nonischemic cardiomyopathy  Related Meds: lovenox, lasix, novolog  Ht: 6\' 1"  (185.4 cm)  Wt: 359 lb 12.7 oz (163.2 kg)  Ideal Wt: 84kg % Ideal Wt: 194  Usual Wt: 310-330# % Usual Wt: 109-116  Body mass index is 47.47 kg/(m^2).  Food/Nutrition Related Hx: Followed the "[facility] Diet" inpatient program in [place] about 10 years ago and lost about 100 pounds.  Has been up to 425# in the past.  Pt states that his weight goal is 221-220#.   Has tried Weight Watchers in the past but gained weight.  Daughter from [place] has ssisted pt with a whole foods diet with many fruits and vegetables and this worked well for pt but wife does the shopping and pt feels that she is unsupportive and sabitogues his weight loss.  Labs:  CMP     Component Value Date/Time   NA 140 11/14/2011 0342   K 3.8 11/14/2011 0342   CL 97 11/14/2011 0342   CO2 34* 11/14/2011 0342   GLUCOSE 162* 11/14/2011 0342   BUN 21 11/14/2011 0342   CREATININE 1.03 11/14/2011 0342   CALCIUM 9.8 11/14/2011 0342   PROT 7.4 04/01/2011 0635   ALBUMIN 3.4* 04/01/2011 0635   AST 15 04/01/2011 0635   ALT 13 04/01/2011 0635   ALKPHOS 69 04/01/2011 0635   BILITOT 0.4 04/01/2011 0635   GFRNONAA 76* 11/14/2011 0342   GFRAA 89* 11/14/2011 0342    Intake: I/O last 3 completed shifts: In: 240 [P.O.:240] Out: 2775 [Urine:2775] Total I/O In: 360 [P.O.:360] Out: 250 [Urine:250]  Diet Order: Heart Healthy  Supplements/Tube Feeding:none  IVF:    Estimated Nutritional  Needs:per day   Kcal: 2400-2500 maintenance, 1900-2000 weight loss Protein: 110-120g Fluid: >2L  Please see diet history above. Pt able to verbalize guidelines for weight loss and low sodium diet but verbalizes several restrictions and difficulty in following.  Tolerating current diet.  Pt meets criteria for morbid obesity with BMI >47.  NUTRITION DIAGNOSIS: -Not ready for diet/lifestyle change (NB-1.3).  Status: Ongoing  RELATED TO: lack of support  AS EVIDENCE BY: pt statmenet  MONITORING/EVALUATION(Goals):Verbalize guidelines And meet minimum needs with po diet.  EDUCATION NEEDS: -Education needs addressed  INTERVENTION: Spent about 1 hour with patient discussing diet hx and requirements/lifestyle changes.  Pt able to state guidelines for low sodium diet and importance as well as implications for not following.   Recommended out patient follow up for weight loss.  The following names and numbers were provided to pt.   Dr. Ramon Dredge [phone], phycologist, and Merrily Pew, RD, certified counselor [phone].  Both are specialists in eating disorders and weight loss.  Pt willing to follow up.  Added Protat bid for additional protein.  Pt prefers vegetarian choices with meals  Dietitian [phone]  DOCUMENTATION CODES Per approved criteria  -Morbid Obesity    Antonio [name] Antonio [name] 11/14/2011, 10:13 AM

## 2011-11-14 NOTE — Progress Notes (Signed)
Pharmacy note - Lovenox dosing    Patient Measurements: Height: 6\' 1"  (185.4 cm) Weight: 359 lb 12.7 oz (163.2 kg) IBW/kg (Calculated) : 79.9    Vital Signs: Temp: 97.7 F (36.5 C) (01/16 0828) Temp src: Temporal (01/16 0828) BP: 124/65 mmHg (01/16 0828) Pulse Rate: 101  (01/16 0828)  Labs:  Basename 11/14/11 0342 11/13/11 2311  HGB -- 12.9*  HCT -- 39.6  PLT -- 398  APTT -- 36  LABPROT -- 17.8*  INR -- 1.44  HEPARINUNFRC -- --  CREATININE 1.03 1.07  CKTOTAL -- --  CKMB -- --  TROPONINI -- --   Estimated Creatinine Clearance: 120.6 ml/min (by C-G formula based on Cr of 1.03).   Assessment: 61 year old man receiving Lovenox for VTE prophylaxis.  Patient weight 163 kg. Goal of Therapy:  Prevent VTE   Plan:  Increase Lovenox to 80mg  daily (0.5 mg/kg/day)  Antonio [name] 11/14/2011,11:15 AM

## 2011-11-14 NOTE — Progress Notes (Signed)
SUBJECTIVE: Breathing is better. No chest pain.   BP 114/60  Pulse 100  Temp(Src) 98.3 F (36.8 C) (Oral)  Resp 26  Ht 6\' 1"  (1.854 m)  Wt 359 lb 12.7 oz (163.2 kg)  BMI 47.47 kg/m2  SpO2 94%  Intake/Output Summary (Last 24 hours) at 11/14/11 1330 Last data filed at 11/14/11 0950  Gross per 24 hour  Intake    600 ml  Output   3025 ml  Net  -2425 ml    PHYSICAL EXAM General: Well developed, well nourished, in no acute distress. Alert and oriented x 3.  Psych:  Good affect, responds appropriately Neck: No JVD. No masses noted.  Lungs: Clear bilaterally with no wheezes or rhonci noted.  Heart: RRR with loud systolic murmur noted. Abdomen: Bowel sounds are present. Soft, non-tender.  Extremities: 1+ bilateral lower extremity edema.   LABS: Basic Metabolic Panel:  Basename 11/14/11 0342 11/13/11 2311  NA 140 141  K 3.8 4.1  CL 97 97  CO2 34* 36*  GLUCOSE 162* 162*  BUN 21 21  CREATININE 1.03 1.07  CALCIUM 9.8 9.7  MG 1.9 --  PHOS -- --   CBC:  Basename 11/13/11 2311  WBC 12.6*  NEUTROABS --  HGB 12.9*  HCT 39.6  MCV 85.5  PLT 398     ASSESSMENT AND PLAN: 61 yo WM who is admitted with acute on chronic systolic heart failure from the office on 11/13/11. He has a history of moderately severe aortic stenosis, DM2, HTN, hyperlipidemia. Cardiac catheterization done December 2011 demonstrated normal coronary arteries. He has a nonischemic cardiomyopathy with an ejection fraction of about 45%. Echo yesterday with LVEF of 55-60%.  He was admitted 6/2-6/6 with acute on chronic systolic heart failure. He improved with IV diuresis. Now admitted with SOB, LE edema, PND, orthopnea.    1. Acute on chronic combined systolic and diastolic heart failure: He is doing much better after diuresis. LVEF is normal by echo yesterday. Will continue IV Lasix today. His CHF likely is complicated by diastolic dysfunction, sleep apnea. His aortic valve stenosis may also be playing a role in  this.   2. Severe aortic valve stenosis: Echo demonstrates severe AS with peak gradient of and mean gradient of 41 mmHg. Cath in December 2011 with AVA of 1.0cm2. This has worsened.  Normal coronaries by cath in December 2011. I will speak to CT surgery while he is here in the hospital about beginning discussions in regards to AVR. He is agreeable to this plan.   3. OSA: patient has refused to wear CPAP at home. Will need to readdress this and find a mask that is comfortable and can be tolerated.   4. DM: Resume home meds. Follow on SSI.   5. HLD: Continue statin.   6. Dispo: Will monitor in stepdown today. Out to telemetry tomorrow.     MCALHANY,CHRISTOPHER  1/16/20131:30 PM

## 2011-11-14 NOTE — Progress Notes (Signed)
Pt is occasionally having a cardiac rhythm that looks like V-fib/V-tach, but the rhythm is corelated to pt shaking his legs while sleeping. Pt reports that these shakes are normal for him. VS at this time: BP: 134/74, HR 109 (NSR), O2 sat 94% on 3L [place].Will continue to monitor. Krisinda Giovanni, Jeanie Sewer, RN

## 2011-11-15 ENCOUNTER — Inpatient Hospital Stay (HOSPITAL_COMMUNITY): Payer: BC Managed Care – PPO

## 2011-11-15 LAB — GLUCOSE, CAPILLARY
Glucose-Capillary: 96 mg/dL (ref 70–99)
Glucose-Capillary: 147 mg/dL — ABNORMAL HIGH (ref 70–99)
Glucose-Capillary: 101 mg/dL — ABNORMAL HIGH (ref 70–99)

## 2011-11-15 LAB — URINALYSIS, ROUTINE W REFLEX MICROSCOPIC
Glucose, UA: NEGATIVE mg/dL
Leukocytes, UA: NEGATIVE
Nitrite: NEGATIVE
Specific Gravity, Urine: 1.012 (ref 1.005–1.030)
pH: 5 (ref 5.0–8.0)

## 2011-11-15 LAB — CBC
HCT: 38.6 % — ABNORMAL LOW (ref 39.0–52.0)
Hemoglobin: 12.1 g/dL — ABNORMAL LOW (ref 13.0–17.0)
MCHC: 31.3 g/dL (ref 30.0–36.0)
RBC: 4.46 MIL/uL (ref 4.22–5.81)
WBC: 14.4 10*3/uL — ABNORMAL HIGH (ref 4.0–10.5)

## 2011-11-15 LAB — BASIC METABOLIC PANEL
BUN: 25 mg/dL — ABNORMAL HIGH (ref 6–23)
Chloride: 95 mEq/L — ABNORMAL LOW (ref 96–112)
GFR calc Af Amer: 88 mL/min — ABNORMAL LOW (ref >90)
Potassium: 3.6 mEq/L (ref 3.5–5.1)
Sodium: 138 mEq/L (ref 135–145)

## 2011-11-15 MED ORDER — COLCHICINE 0.6 MG PO TABS
1.2000 mg | ORAL_TABLET | Freq: Once | ORAL | Status: AC
  Administered 2011-11-15: 1.2 mg via ORAL
  Filled 2011-11-15: qty 2

## 2011-11-15 MED ORDER — HYDROCODONE-ACETAMINOPHEN 5-325 MG PO TABS
1.0000 | ORAL_TABLET | Freq: Four times a day (QID) | ORAL | Status: DC | PRN
  Administered 2011-11-15 – 2011-11-16 (×2): 2 via ORAL
  Administered 2011-11-16 – 2011-11-17 (×4): 1 via ORAL
  Administered 2011-11-18 – 2011-11-24 (×13): 2 via ORAL
  Filled 2011-11-15 (×3): qty 2
  Filled 2011-11-15: qty 1
  Filled 2011-11-15 (×4): qty 2
  Filled 2011-11-15: qty 1
  Filled 2011-11-15: qty 2
  Filled 2011-11-15 (×2): qty 1
  Filled 2011-11-15 (×7): qty 2

## 2011-11-15 MED ORDER — POTASSIUM CHLORIDE CRYS ER 20 MEQ PO TBCR
40.0000 meq | EXTENDED_RELEASE_TABLET | Freq: Every day | ORAL | Status: DC
  Administered 2011-11-15 – 2011-11-28 (×14): 40 meq via ORAL
  Filled 2011-11-15: qty 2
  Filled 2011-11-15: qty 1
  Filled 2011-11-15: qty 2
  Filled 2011-11-15: qty 1
  Filled 2011-11-15 (×12): qty 2

## 2011-11-15 MED ORDER — POLYETHYLENE GLYCOL 3350 17 G PO PACK
17.0000 g | PACK | Freq: Every day | ORAL | Status: DC | PRN
  Administered 2011-11-15: 17 g via ORAL
  Filled 2011-11-15: qty 1

## 2011-11-15 MED ORDER — LISINOPRIL 20 MG PO TABS
20.0000 mg | ORAL_TABLET | Freq: Every day | ORAL | Status: DC
  Administered 2011-11-15: 20 mg via ORAL
  Filled 2011-11-15 (×2): qty 1

## 2011-11-15 MED ORDER — COLCHICINE 0.6 MG PO TABS
0.6000 mg | ORAL_TABLET | Freq: Two times a day (BID) | ORAL | Status: DC
  Administered 2011-11-16 – 2011-11-28 (×26): 0.6 mg via ORAL
  Filled 2011-11-15 (×28): qty 1

## 2011-11-15 NOTE — Progress Notes (Signed)
11/15/11  HgbA1C is 8.5 %  on 11/13/11.  A1C was 12.9% in 2011.  Needs to be followed by PCP on discharge for blood glucose control. Smith Mince RN BSN

## 2011-11-15 NOTE — Progress Notes (Signed)
SUBJECTIVE:Pt states that his breathing is better. He continues to diurese well. He has no chest pain. He had strange dreams overnight that are documented by nursing staff but has been alert and oriented.   BP 90/69  Pulse 101  Temp(Src) 98.7 F (37.1 C) (Oral)  Resp 26  Ht 6\' 1"  (1.854 m)  Wt 362 lb 10.5 oz (164.5 kg)  BMI 47.85 kg/m2  SpO2 87%  Intake/Output Summary (Last 24 hours) at 11/15/11 0710 Last data filed at 11/15/11 0600  Gross per 24 hour  Intake    603 ml  Output   2475 ml  Net  -1872 ml    PHYSICAL EXAM General: Well developed, well nourished, in no acute distress. Alert and oriented x 3.  Psych:  Good affect, responds appropriately Neck: No JVD. No masses noted.  Lungs: Clear bilaterally with no wheezes or rhonci noted.  Heart: RRR with no murmurs noted. Abdomen: Bowel sounds are present. Soft, non-tender.  Extremities: No lower extremity edema.   LABS: Basic Metabolic Panel:  Basename 11/15/11 0355 11/14/11 0342  NA 138 140  K 3.6 3.8  CL 95* 97  CO2 34* 34*  GLUCOSE 112* 162*  BUN 25* 21  CREATININE 1.04 1.03  CALCIUM 9.3 9.8  MG -- 1.9  PHOS -- --   CBC:  Basename 11/15/11 0355 11/13/11 2311  WBC 14.4* 12.6*  NEUTROABS -- --  HGB 12.1* 12.9*  HCT 38.6* 39.6  MCV 86.5 85.5  PLT 378 398    Current Meds:    . aspirin EC  81 mg Oral Daily  . carvedilol  6.25 mg Oral BID WC  . enoxaparin  80 mg Subcutaneous QHS  . feeding supplement  30 mL Oral BID AC  . fenofibrate  160 mg Oral Daily  . furosemide  80 mg Intravenous Q8H  . glimepiride  4 mg Oral BID WC  . insulin aspart  0-15 Units Subcutaneous TID WC  . insulin aspart  0-5 Units Subcutaneous QHS  . insulin glargine  60 Units Subcutaneous QHS  . lisinopril  40 mg Oral Daily  . metFORMIN  1,000 mg Oral BID WC  . rosuvastatin  20 mg Oral q1800  . sodium chloride  3 mL Intravenous Q12H  . DISCONTD: enoxaparin  40 mg Subcutaneous QHS    ASSESSMENT AND PLAN: 61 yo WM who is admitted  with acute on chronic systolic heart failure from the office on 11/13/11. He has a history of moderately severe aortic stenosis, DM2, HTN, hyperlipidemia. Cardiac catheterization done December 2011 demonstrated normal coronary arteries. He has a nonischemic cardiomyopathy with an ejection fraction of about 45%. Echo yesterday with LVEF of 55-60%. He was admitted 6/2-6/6 with acute on chronic systolic heart failure. He improved with IV diuresis. Now admitted with SOB, LE edema, PND, orthopnea.   1. Acute on chronic combined systolic and diastolic heart failure: Continues to diurese with IV Lasix. LVEF is normal by echo 11/13/11. Will continue IV Lasix today. His CHF likely is complicated by diastolic dysfunction, sleep apnea. His aortic valve stenosis may also be playing a role in this. Relative hypotension. Will lower his Lisinopril to 20 mg po BID and will try to titrate his beta blocker tomorrow.   2. Severe aortic valve stenosis: Echo demonstrates severe AS with peak gradient of and mean gradient of 41 mmHg. Cath in December 2011 with AVA of 1.0cm2. This has worsened. Normal coronaries by cath in December 2011. I will speak to CT  surgery while he is here in the hospital about beginning discussions in regards to AVR. He is agreeable to this plan.   3. OSA: patient has refused to wear CPAP at home. Will need to readdress this and find a mask that is comfortable and can be tolerated.   4. DM: Continue home meds. Follow on SSI.   5. HLD: Continue statin.   6. Constipation: Miralax  prn.   7. Slight elevation WBC count: Afebrile. Will get CXR and u/a today.   8. Dispo: To telemetry today.     Taesha Goodell  1/17/20137:10 AM

## 2011-11-15 NOTE — Progress Notes (Signed)
Patient is capable of answering orientation questions appropriately, however, he rambles about odd dreams that he says he has been having.  He seems confused but when asked orientation questions he is able to answer them correctly and states that he isn't confused and that he is just having "strange dreams."   Will continue to monitor patient.   Timmie Foerster, RN

## 2011-11-15 NOTE — Progress Notes (Signed)
Patient has had monitor alarm to go off showing Vfib/VTach.  This is only happening when the patient is having episodes of legs shaking, which is normal according to the patient.  The patient is alert and oriented and vitals are WDL.  Will continue to monitor patient.  Timmie Foerster, RN

## 2011-11-15 NOTE — Progress Notes (Signed)
Patient continues to ramble on bizarre topics.  He is able to answer orientation questions appropriately and seems to be oriented to person, place, time, and situation when asked these questions, however seems confused when he immediately goes back to rambling about other things.  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  I promise I'm not confused."   The patient is impulsive and is not steady on his feet.  I have set the bed alarm and reinforced the importance of calling before attempting to get up.   Will continue to monitor patient.   Timmie Foerster, RN

## 2011-11-16 ENCOUNTER — Inpatient Hospital Stay (HOSPITAL_COMMUNITY): Payer: BC Managed Care – PPO

## 2011-11-16 DIAGNOSIS — G4730 Sleep apnea, unspecified: Secondary | ICD-10-CM

## 2011-11-16 DIAGNOSIS — R4182 Altered mental status, unspecified: Secondary | ICD-10-CM

## 2011-11-16 DIAGNOSIS — R0902 Hypoxemia: Secondary | ICD-10-CM

## 2011-11-16 DIAGNOSIS — I359 Nonrheumatic aortic valve disorder, unspecified: Secondary | ICD-10-CM

## 2011-11-16 DIAGNOSIS — G4710 Hypersomnia, unspecified: Secondary | ICD-10-CM

## 2011-11-16 LAB — BASIC METABOLIC PANEL
BUN: 40 mg/dL — ABNORMAL HIGH (ref 6–23)
CO2: 34 mEq/L — ABNORMAL HIGH (ref 19–32)
Calcium: 9.5 mg/dL (ref 8.4–10.5)
GFR calc non Af Amer: 38 mL/min — ABNORMAL LOW (ref >90)
Glucose, Bld: 60 mg/dL — ABNORMAL LOW (ref 70–99)

## 2011-11-16 LAB — CBC
Hemoglobin: 12.1 g/dL — ABNORMAL LOW (ref 13.0–17.0)
MCHC: 31.3 g/dL (ref 30.0–36.0)
RBC: 4.48 MIL/uL (ref 4.22–5.81)
WBC: 18.5 10*3/uL — ABNORMAL HIGH (ref 4.0–10.5)

## 2011-11-16 LAB — BLOOD GAS, ARTERIAL
Bicarbonate: 33.3 mEq/L — ABNORMAL HIGH (ref 20.0–24.0)
O2 Saturation: 81 %
pO2, Arterial: 47.4 mmHg — ABNORMAL LOW (ref 80.0–100.0)

## 2011-11-16 LAB — GLUCOSE, CAPILLARY
Glucose-Capillary: 49 mg/dL — ABNORMAL LOW (ref 70–99)
Glucose-Capillary: 101 mg/dL — ABNORMAL HIGH (ref 70–99)
Glucose-Capillary: 102 mg/dL — ABNORMAL HIGH (ref 70–99)

## 2011-11-16 MED ORDER — ACETAZOLAMIDE SODIUM 500 MG IJ SOLR
250.0000 mg | Freq: Once | INTRAMUSCULAR | Status: AC
  Administered 2011-11-16: 250 mg via INTRAVENOUS
  Filled 2011-11-16: qty 500

## 2011-11-16 MED ORDER — INSULIN GLARGINE 100 UNIT/ML ~~LOC~~ SOLN
50.0000 [IU] | Freq: Every day | SUBCUTANEOUS | Status: DC
  Administered 2011-11-16: 50 [IU] via SUBCUTANEOUS

## 2011-11-16 NOTE — Consult Note (Signed)
[address]            [place] [address]          [phone]       [name].  Medical Record #[id number] Date of Birth: May 25, 1951  Referring: No ref. provider found Primary Care: [name], MD, MD  Chief Complaint:   Shortness of hand altered mental status Chief Complaint  Patient presents with  . Shortness of Breath    History of Present Illness:      the patient is a 61 year old obese Caucasian diabetic nonsmoker with known significant aortic stenosis since a cardiac cath performed one year ago. He was admitted to the hospital in August with CHF and diuresis. He is admitted to the hospital currently for shortness of breath, CHF, and altered mental status. He is found to be hypoxic and hypercarbic. A 2-D echo was performed showing aortic valve gradient peak 55 mmHg, EF 45, no significant MR no significant pericardial effusion. He is been seen by pouring medicine and a blood gas demonstrated PCO2 of 55 PO2 of 50. He does not appear to have pneumonia but he does have vascular congestion. His creatinine is increased from 1.1-1.8 during this admission and he has developed severe gout  in both feet right greater than left and cannot walk. He is been confused and mildly disoriented.    Current Activity/ Functional Status: He has had a very low functional status for the past few weeks due to his shortness of breath and confusion    Past Medical History  Diagnosis Date  . Hepatitis   . Chronic systolic heart failure     echo 12/11: EF 45-50%, grade 1 diast dysfxn, moderately severe AS (AVA 0.9, mean 31 mmHg), mild MR;    cath 1/12:  normal cors, AVA 1.0, peak to peak gradiet for AV 31 mmHg, mod-severe AS, EF 35-40%  . 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 Hx MRSA infection     thigh abcess 11/06  . Anxiety   . Aortic stenosis     echo 12/11: AVA 0.9, mean 31, cath 1/12: AVA 1.0 with peak to peak 31 mmHg  .  HTN (hypertension)   . DM2 (diabetes mellitus, type 2)   . HLD (hyperlipidemia)   . Gout   . NICM (nonischemic cardiomyopathy)     Past Surgical History  Procedure Date  . Transthoracic echocardiogram 12/05    EF: 55-65% mild to moderate AS  . Cardiovascular stress test 10/10/04    ? inf. wall defect -  . Laminectomy 10/30/83    History  Smoking status  . Never Smoker   Smokeless tobacco  . Not on file    History  Alcohol Use: Not on file    History   Social History  . Marital Status: Married    Spouse Name: N/A    Number of Children: N/A  . Years of Education: N/A   Occupational History  . retired travelling Engineer, site    Social History Main Topics  . Smoking status: Never Smoker   . Smokeless tobacco: Not on file  . Alcohol Use: Not on file  . Drug Use: Not on file  . Sexually Active: Not on file   Other Topics Concern  . Not on file   Social History Narrative  .  No narrative on file    Allergies  Allergen Reactions  . Oxycodone-Aspirin     REACTION: rash    Current Facility-Administered Medications  Medication Dose Route Frequency Provider Last Rate Last Dose  . 0.9 %  sodium chloride infusion  250 mL Intravenous PRN Darrol Jump, PA      . acetaminophen (TYLENOL) tablet 650 mg  650 mg Oral Q4H PRN Darrol Jump, PA      . acetaZOLAMIDE (DIAMOX) injection 250 mg  250 mg Intravenous Once Koren Bound, MD   250 mg at 11/16/11 1704  . ALPRAZolam Prudy Feeler) tablet 0.25 mg  0.25 mg Oral BID PRN Darrol Jump, PA      . aspirin EC tablet 81 mg  81 mg Oral Daily Verne Carrow, MD   81 mg at 11/16/11 1159  . carvedilol (COREG) tablet 6.25 mg  6.25 mg Oral BID WC Verne Carrow, MD   6.25 mg at 11/16/11 1713  . colchicine tablet 0.6 mg  0.6 mg Oral BID Darrol Jump, PA   0.6 mg at 11/16/11 1200  . colchicine tablet 1.2 mg  1.2 mg Oral Once Darrol Jump, PA   1.2 mg at 11/15/11 2330  . enoxaparin (LOVENOX) injection 80 mg   80 mg Subcutaneous QHS Mickeal Skinner, PHARMD   80 mg at 11/15/11 2148  . fenofibrate tablet 160 mg  160 mg Oral Daily Verne Carrow, MD   160 mg at 11/16/11 1201  . glimepiride (AMARYL) tablet 4 mg  4 mg Oral BID WC Verne Carrow, MD   4 mg at 11/16/11 1713  . HYDROcodone-acetaminophen (NORCO) 5-325 MG per tablet 1-2 tablet  1-2 tablet Oral Q6H PRN Darrol Jump, PA   1 tablet at 11/16/11 1204  . insulin aspart (novoLOG) injection 0-15 Units  0-15 Units Subcutaneous TID WC Darrol Jump, PA   2 Units at 11/15/11 1201  . insulin aspart (novoLOG) injection 0-5 Units  0-5 Units Subcutaneous QHS Rhonda G. Barrett, PA      . insulin glargine (LANTUS) injection 50 Units  50 Units Subcutaneous QHS Rhonda G. Barrett, PA      . ondansetron (ZOFRAN) injection 4 mg  4 mg Intravenous Q6H PRN Joline Salt Barrett, PA      . polyethylene glycol (MIRALAX / GLYCOLAX) packet 17 g  17 g Oral Daily PRN Verne Carrow, MD   17 g at 11/15/11 0921  . potassium chloride SA (K-DUR,KLOR-CON) CR tablet 40 mEq  40 mEq Oral Daily Verne Carrow, MD   40 mEq at 11/16/11 1200  . rosuvastatin (CRESTOR) tablet 20 mg  20 mg Oral q1800 Verne Carrow, MD   20 mg at 11/15/11 1732  . sodium chloride 0.9 % injection 3 mL  3 mL Intravenous Q12H Rhonda G. Barrett, PA   3 mL at 11/16/11 1200  . sodium chloride 0.9 % injection 3 mL  3 mL Intravenous PRN Darrol Jump, PA      . zolpidem (AMBIEN) tablet 5 mg  5 mg Oral QHS PRN Darrol Jump, PA      . DISCONTD: feeding supplement (PRO-STAT SUGAR FREE 64) liquid 30 mL  30 mL Oral BID AC Jeoffrey Massed, RD   30 mL at 11/16/11 1202  . DISCONTD: furosemide (LASIX) injection 80 mg  80 mg Intravenous Q8H Darrol Jump, PA   80 mg at 11/16/11 0610  . DISCONTD: insulin glargine (LANTUS) injection 60 Units  60 Units Subcutaneous  QHS Verne Carrow, MD   60 Units at 11/15/11 2148  . DISCONTD: lisinopril (PRINIVIL,ZESTRIL) tablet 20 mg  20 mg  Oral Daily Verne Carrow, MD   20 mg at 11/15/11 0923  . DISCONTD: metFORMIN (GLUCOPHAGE) tablet 1,000 mg  1,000 mg Oral BID WC Verne Carrow, MD   1,000 mg at 11/15/11 1732    Prescriptions prior to admission  Medication Sig Dispense Refill  . aspirin 81 MG tablet Take 81 mg by mouth daily.        Marland Kitchen atorvastatin (LIPITOR) 40 MG tablet Take 1 tablet (40 mg total) by mouth daily.  90 tablet  3  . carvedilol (COREG) 6.25 MG tablet Take 1 tablet (6.25 mg total) by mouth 2 (two) times daily with a meal.  180 tablet  3  . fenofibrate (TRICOR) 145 MG tablet Take 1 tablet (145 mg total) by mouth daily.  90 tablet  3  . furosemide (LASIX) 80 MG tablet Take 1 tablet (80 mg total) by mouth 2 (two) times daily.  180 tablet  3  . glimepiride (AMARYL) 4 MG tablet Take 4 mg by mouth 2 (two) times daily.        . indomethacin (INDOCIN) 50 MG capsule Take 50 mg by mouth 3 (three) times daily as needed. For gout      . insulin glargine (LANTUS) 100 UNIT/ML injection Inject 60 Units into the skin at bedtime.       Marland Kitchen lisinopril (PRINIVIL,ZESTRIL) 40 MG tablet Take 1 tablet (40 mg total) by mouth daily.  90 tablet  3  . metFORMIN (GLUCOPHAGE) 1000 MG tablet Take 1,000 mg by mouth 2 (two) times daily with meals.          Family History  Problem Relation Age of Onset  . Heart attack Father      Review of Systems:     Cardiac Review of Systems: Y or N  Chest Pain [    ]  Resting SOB [   ] Exertional SOB  [ y ]  Orthopnea [  ]   Pedal Edema [   ]    Palpitations [  ] Syncope  [  ]   Presyncope [   ]  General Review of Systems: [Y] = yes [  ]=no Constitional: recent weight change [n  ]; anorexia [  ]; fatigue [  ]; nausea [ n ]; night sweats [n  ]; fever [  ]; or chills [  ];                                                                                                                                          Dental: poor dentition[  ]; Last Dentistn visit: > 1 year  Eye : blurred vision [  ];  diplopia [   ]; vision changes [  ];  Amaurosis fugax[  ]; Resp: cough [  ];  wheezing[  ];  hemoptysis[  ]; shortness of breath[y  ]; paroxysmal nocturnal dyspnea[  ]; dyspnea on exertion[  ]; or orthopnea[  ];  GI:  gallstones[  ], vomiting[ n ];  dysphagia[  ]; melena[  ];  hematochezia [  ]; heartburn[  ];   Hx of  Colonoscopy[  ]; GU: kidney stones [  ]; hematuria[  ];   dysuria [  ];  nocturia[  ];  history of     obstruction [  ];             Skin: rash, swelling[  ];, hair loss[  ];  peripheral edema[  ];  or itching[  ]; Musculosketetal: myalgias[  ];  joint swelling[  ];  joint erythema[  ];  joint pain[  ];  back pain[  ];  Heme/Lymph: bruising[  ];  bleeding[  ];  anemia[  ];  Neuro: TIA[  ];  headaches[  ];  stroke[  ];  vertigo[  ];  seizures[  ];   paresthesias[  ];  difficulty walking[  ];  Psych:depression[  ]; anxiety[  ];  Endocrine: diabetes[  ];  thyroid dysfunction[  ];  Immunizations: Flu [  ]; Pneumococcal[  ];  Other:  Physical Exam: BP 134/64  Pulse 83  Temp(Src) 98.5 F (36.9 C) (Oral)  Resp 22  Ht 6\' 1"  (1.854 m)  Wt 365 lb 11.9 oz (165.9 kg)  BMI 48.25 kg/m2  SpO2 96%  Exam morbidly obese Caucasian male in his hospital room accompanied by wife   HEENT pupils equal dentition poor   neck without JVD or bruit carotid pulses 1-2+ Thorax distant breath sounds no deformity or tenderness Cardiac high pitched grade 2/6 systolic ejection murmur at the upper sternal borders regular rhythm no diastolic murmur no gallop Abdomen obese nontender Extremities 2+ ankle edema erythema tenderness of the right foot with gallop Neurologic mildly disoriented and confused to location in time no focal motor deficit   Diagnostic Studies & Laboratory data: 2-D echo is reviewed showing calcified aortic stenosis moderate to severe EF 45% with LVH      Recent Radiology Findings:   Dg Chest 1 View  11/16/2011  *RADIOLOGY REPORT*  Clinical Data: Follow up opacity in the right  lung  CHEST - 1 VIEW  Comparison: Chest x-ray of 11/15/2011  Findings: The lungs are not as well aerated.  The vague opacity noted previously the right upper lung field is not seen and may represent resolving pneumonia or edema. There does appear to be minimal pulmonary vascular congestion present.  Cardiomegaly is stable.  IMPRESSION: Stable cardiomegaly.  No definite infiltrate or effusion. Probable minimal pulmonary vascular congestion.  Original Report Authenticated By: Juline Patch, M.D.   Dg Chest 2 View  11/15/2011  *RADIOLOGY REPORT*  Clinical Data: CHF and elevated white blood cell count  CHEST - 2 VIEW  Comparison: 04/01/2011  Findings: There is moderate cardiac enlargement.  Atelectasis is noted within the left base.  Unchanged from previous exam.  Interstitial edema pattern is noted with worsening aeration to the right upper lobe.  IMPRESSION:  1.  Slight increased opacification of the right upper lobe. 2.  CHF.  Original Report Authenticated By: Rosealee Albee, M.D.      Recent Lab Findings: Lab Results  Component Value Date   WBC 18.5* 11/16/2011   HGB 12.1* 11/16/2011   HCT 38.6* 11/16/2011   PLT 378 11/16/2011   GLUCOSE 60* 11/16/2011  CHOL 195 05/19/2010   TRIG 254* 05/19/2010   HDL 35* 05/19/2010   LDLDIRECT 106* 09/03/2007   LDLCALC 109* 05/19/2010   ALT 13 04/01/2011   AST 15 04/01/2011   NA 138 11/16/2011   K 4.6 11/16/2011   CL 94* 11/16/2011   CREATININE 1.85* 11/16/2011   BUN 40* 11/16/2011   CO2 34* 11/16/2011   TSH 2.792 11/13/2011   INR 1.44 11/13/2011   HGBA1C 8.5* 11/13/2011      Assessment / Plan:     Significant aortic stenosis. The patient benefit from aortic valve replacement. However at this time he has significant metabolic and pulmonary problems with confusion hypercarbia gout inability to walk elevated white count of unknown origin.   the patient has had a cath one year ago. I would not recommend repeat coronary arteriograms due to his elevated creatinine  however right heart cath to determine if he is in a low cardiac output state would be very helpful in deciding the timing of surgery as well as managing potential pulmonary hypertension with perioperative nitric oxide.    he will need orthopedic gram dental exam, ultrasound of his legs to rule out DVT, possible VQ scan to rule out PE, primary function tests, and treatment of gout. Wall his metabolic and medical problems are evaluated and treated recommend consideration of right heart cath combined with balloon aortic valvuloplasty if he is found to be in a low cardiac output state. This would allow time for this morbidly obese man with multiple medical problems to be stabilized and optimized for aortic valve replacement a later date.  Will follow.       @me1 @ 11/16/2011 5:44 PM

## 2011-11-16 NOTE — Progress Notes (Signed)
   CARE MANAGEMENT NOTE HEART FAILURE  11/16/2011   Patient:  Antonio [name], Antonio [name]   Account Number:  [id number]    Date Initiated:  11/16/2011  Documentation initiated by:  Tera Mater  Subjective/Objective Assessment:   60yo male direct admit from cardiology office with SOB, LE edema, orthopnea.  Pt. lives with spouse at home.  HX: CHF, DM, HTN, AS.   Action/Plan:   Spoke with pt. and wife at length about HF book and CPAP use. Pt. has sleep apnea and has a CPAP machine at home, however he refuses to wear it.  Encouraged pt. to use CPAP machine while in hospital.  May benefit from [facility] RN for HF manage.   Anticipated DC Date:  11/19/2011  Anticipated DC Plan:  HOME W HOME HEALTH SERVICES  DC Planning Services:  CM consult    Choice offered to / List presented to:          Status of service:  In process, will continue to follow  Medicare Important Message Given:  NO (If response is "NO", the following Medicare IM given date fields will be blank) Date Medicare IM Given:   Date Additional Medicare IM Given:    Discharge Disposition:    Per UR Regulation:    Comments:     Initial CM contact:  11/16/2011 03:00 PM  By:  Tera Mater Initial CSW contact:     By:      Is this an INP Readmission < 30 days:  N (If "YES" please see readmission information at the bottom of note)  Patient living status prior to this admission:  FAMILY  Patient setting prior to this admission:  HOME  Comorbid conditions being treated that contributed to this admission:  CHF, AS, DM, HTN  CHF Readmission Risk:  high  Type of patient education provided  HF Patient Education Assessment / Teach Back  HF Zone Tool / Magnet  Weigh daily  Limit salt intake     Patient education provided by  [facility]    Was referral made to [facility]:  N  Is the patient's PCP the same as attending:  N PCP:    Readmission < 30 Days If pt has HH, did they contact the agency before going to the ED:     Name of [facility] agency:    Was the follow-up physician visit scheduled prior to discharge:    Did the patient follow-up with the physician prior to this readmission:    Was there HF Clinic visits prior to readmission:    Were there ED visits between admissions:    Readmit type:    If unscheduled and related indicate reason for readmit:

## 2011-11-16 NOTE — Progress Notes (Signed)
Inpatient Diabetes Program Recommendations  AACE/ADA: New Consensus Statement on Inpatient Glycemic Control (2009)  Target Ranges:  Prepandial:   less than 140 mg/dL      Peak postprandial:   less than 180 mg/dL (1-2 hours)      Critically ill patients:  140 - 180 mg/dL   Reason for Visit: Results for Antonio [name], Antonio [name] (MRN [id number]) as of 11/16/2011 14:54  Ref. Range 11/16/2011 06:00 11/16/2011 06:03 11/16/2011 06:05 11/16/2011 06:55 11/16/2011 11:17  Glucose-Capillary Latest Range: 70-99 mg/dL 49 (L) 63 (L)  409 (H) 96  Glucose Latest Range: 70-99 mg/dL   60 (L)      Inpatient Diabetes Program Recommendations Insulin - Basal: Decrease Lantus to 50 units daily.  Note: Also called pharmacy to address elevated creatnine and metformin.  They will discontinue Metformin per hospital protocol.

## 2011-11-16 NOTE — Progress Notes (Signed)
SUBJECTIVE: Breathing is better this am. No complaints of pain. He feels drowsy. Reports per family of pt being sleepy yesterday.   BP 120/76  Pulse 104  Temp(Src) 98.1 F (36.7 C) (Oral)  Resp 22  Ht 6\' 1"  (1.854 m)  Wt 365 lb 11.9 oz (165.9 kg)  BMI 48.25 kg/m2  SpO2 90%  Intake/Output Summary (Last 24 hours) at 11/16/11 0836 Last data filed at 11/16/11 1478  Gross per 24 hour  Intake   1623 ml  Output    875 ml  Net    748 ml    PHYSICAL EXAM General: Well developed, well nourished, in no acute distress. Alert and oriented x 3. Slightly somnolent.  Neck: No JVD. No masses noted.  Lungs: Decreased breath sounds bilaterally.  No wheezes or rhonci noted.  Heart: Tachy with no murmurs noted. Abdomen: Bowel sounds are present. Soft, non-tender.  Extremities: 1+ bilateral lower extremity edema.   LABS: Basic Metabolic Panel:  Basename 11/16/11 0605 11/15/11 0355 11/14/11 0342  NA 138 138 --  K 4.6 3.6 --  CL 94* 95* --  CO2 34* 34* --  GLUCOSE 60* 112* --  BUN 40* 25* --  CREATININE 1.85* 1.04 --  CALCIUM 9.5 9.3 --  MG -- -- 1.9  PHOS -- -- --   CBC:  Basename 11/16/11 0605 11/15/11 0355  WBC 18.5* 14.4*  NEUTROABS -- --  HGB 12.1* 12.1*  HCT 38.6* 38.6*  MCV 86.2 86.5  PLT 378 378    Current Meds:    . aspirin EC  81 mg Oral Daily  . carvedilol  6.25 mg Oral BID WC  . colchicine  0.6 mg Oral BID  . colchicine  1.2 mg Oral Once  . enoxaparin  80 mg Subcutaneous QHS  . feeding supplement  30 mL Oral BID AC  . fenofibrate  160 mg Oral Daily  . furosemide  80 mg Intravenous Q8H  . glimepiride  4 mg Oral BID WC  . insulin aspart  0-15 Units Subcutaneous TID WC  . insulin aspart  0-5 Units Subcutaneous QHS  . insulin glargine  60 Units Subcutaneous QHS  . lisinopril  20 mg Oral Daily  . metFORMIN  1,000 mg Oral BID WC  . potassium chloride  40 mEq Oral Daily  . rosuvastatin  20 mg Oral q1800  . sodium chloride  3 mL Intravenous Q12H     ASSESSMENT  AND PLAN:  ASSESSMENT AND PLAN: 61 yo WM who is admitted with acute on chronic systolic and diastolic heart failure from the office on 11/13/11. He has a history of moderately severe aortic stenosis, DM2, HTN, hyperlipidemia. Cardiac catheterization done December 2011 demonstrated normal coronary arteries. He is known to have a nonischemic cardiomyopathy with an ejection fraction of about 45%. Echo yesterday with LVEF of 55-60%. He was admitted 6/2-6/6 with acute on chronic systolic heart failure. He improved with IV diuresis. Now re-admitted with SOB, LE edema, PND, orthopnea. He has been diuresing well.   1. Acute on chronic combined systolic and diastolic heart failure: He has diuresed 11 liters since admission. His BUN/creatinine have bumped since yesterday. Will hold Lasix today. LVEF is normal by echo 11/13/11. His CHF likely is complicated by diastolic dysfunction and sleep apnea. His aortic valve stenosis may also be playing a role in this. Relative hypotension so his Lisinopril was lowered to 20 mg po Qdaily yesterday. With acute renal failure today, will hold Lisinopril.    2. Severe aortic  valve stenosis: Echo demonstrates severe AS with peak gradient of and mean gradient of 41 mmHg. Cath in December 2011 with AVA of 1.0cm2. This has worsened. Normal coronaries by cath in December 2011. I will speak to CT surgery today and have them meet the patient while he is here in the hospital. I believe his is probably nearing the point where AVR should be considered.  He is agreeable to this plan.   3. OSA: patient has refused to wear CPAP at home. He has daytime drowsiness. Will ask pulmonary to see him and make recommendations on his current pulmonary status. He is somnolent over last 24 hours. Will get ABG. Repeat CXR. Will ask for recs on bipap pending ABG and then CPAP as an outpt. He has seen a pulmonologist in [facility] but has refused to wear CPAP at home.    4. DM: Continue home meds.  Follow on SSI.   5. HLD: Continue statin.   6. Constipation: Miralax prn.  7. Elevated  WBC count: Afebrile. CXR yesterday with interstitial edema and slight opacification of upper right lobe. U/A negative. Will repeat CXR today. If the RUL opacity is still present, will start antibiotics.    8. Acute renal failure: Likely secondary to over diuresis. Will hold Lasix today and follow creatinine.   8. Dispo: Will need several more days of hospitalization.     Antonio [name]  1/18/20138:36 AM

## 2011-11-16 NOTE — Progress Notes (Signed)
Pt experienced hypoglycemia this morning; blood sugar was 63.  Pt was given of juice and rechecked afterward.  His blood sugar then was 100.  AM oral glycemic control medications were held.  Oncoming nurse notified. A.T. Claudette Laws RN

## 2011-11-16 NOTE — Plan of Care (Signed)
Problem: Phase I Progression Outcomes Goal: EF % per last Echo/documented,Core Reminder form on chart Outcome: Completed/Met Date Met:  11/16/11 55%-60%

## 2011-11-16 NOTE — Consult Note (Signed)
Name: Antonio [name]. MRN: [id number] DOB: [birth year]    LOS: 3  PCCM ADMISSION NOTE  History of Present Illness: 80 M, morbid obesity and OSA as well as extensive cardiac history including AS, HTN and CHF.  Patient was admitted from cardiology's office with the chief complaint of SOB.  Was a direct admit from the office when he was noted to have an O2 sat of 80% on RA.  He is very non-compliant with cardiology medications and fluid intake.  Had a sleep study that revealed severe OSA but in non-compliant with CPAP either.  Interestingly enough, pulmonary artery pressure is normal with normal RV function.  Hypoxemia noted with SOB.  Patient also appears more confused this AM than before.  Lines / Drains: PIV  Cultures: None  Antibiotics: None  Tests / Events: Echo noted.  Past Medical History  Diagnosis Date  . Hepatitis   . Chronic systolic heart failure     echo 12/11: EF 45-50%, grade 1 diast dysfxn, moderately severe AS (AVA 0.9, mean 31 mmHg), mild MR;    cath 1/12:  normal cors, AVA 1.0, peak to peak gradiet for AV 31 mmHg, mod-severe AS, EF 35-40%  . 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 Hx MRSA infection     thigh abcess 11/06  . Anxiety   . Aortic stenosis     echo 12/11: AVA 0.9, mean 31, cath 1/12: AVA 1.0 with peak to peak 31 mmHg  . HTN (hypertension)   . DM2 (diabetes mellitus, type 2)   . HLD (hyperlipidemia)   . Gout   . NICM (nonischemic cardiomyopathy)    Past Surgical History  Procedure Date  . Transthoracic echocardiogram 12/05    EF: 55-65% mild to moderate AS  . Cardiovascular stress test 10/10/04    ? inf. wall defect -  . Laminectomy 10/30/83   Prior to Admission medications   Medication Sig Start Date End Date Taking? Authorizing Provider  aspirin 81 MG tablet Take 81 mg by mouth daily.     Yes Historical Provider, MD  atorvastatin (LIPITOR) 40 MG tablet Take 1 tablet (40 mg total) by mouth daily. 11/07/11  Yes Verne Carrow, MD   carvedilol (COREG) 6.25 MG tablet Take 1 tablet (6.25 mg total) by mouth 2 (two) times daily with a meal. 11/07/11  Yes Verne Carrow, MD  fenofibrate (TRICOR) 145 MG tablet Take 1 tablet (145 mg total) by mouth daily. 11/07/11  Yes Verne Carrow, MD  furosemide (LASIX) 80 MG tablet Take 1 tablet (80 mg total) by mouth 2 (two) times daily. 11/07/11  Yes Verne Carrow, MD  glimepiride (AMARYL) 4 MG tablet Take 4 mg by mouth 2 (two) times daily.     Yes Historical Provider, MD  indomethacin (INDOCIN) 50 MG capsule Take 50 mg by mouth 3 (three) times daily as needed. For gout   Yes Historical Provider, MD  insulin glargine (LANTUS) 100 UNIT/ML injection Inject 60 Units into the skin at bedtime.    Yes Historical Provider, MD  lisinopril (PRINIVIL,ZESTRIL) 40 MG tablet Take 1 tablet (40 mg total) by mouth daily. 11/07/11  Yes Verne Carrow, MD  metFORMIN (GLUCOPHAGE) 1000 MG tablet Take 1,000 mg by mouth 2 (two) times daily with meals.     Yes Historical Provider, MD    Allergies Allergies  Allergen Reactions  . Oxycodone-Aspirin     REACTION: rash    Family History Family History  Problem Relation Age  of Onset  . Heart attack Father    Social History  reports that he has never smoked. He does not have any smokeless tobacco history on file. His alcohol and drug histories not on file.  Review Of Systems  11 points review of systems is negative with an exception of listed in HPI.  Vital Signs: Filed Vitals:   11/16/11 0533  BP: 120/76  Pulse: 104  Temp: 98.1 F (36.7 C)  Resp: 22    Intake/Output Summary (Last 24 hours) at 11/16/11 1208 Last data filed at 11/16/11 1024  Gross per 24 hour  Intake   1443 ml  Output    700 ml  Net    743 ml   Physical Examination: General:  Morbidly obese, somnolent but easily arousable and interactive. Neuro:  Somnolent but easily arousable, moving all ext. HEENT:  [place]/AT, PERRL, EOM-I and dry MM. Neck:  Supple, -LAN and  -thyromegally.   Cardiovascular:  RRR, Nl S1/S2, -M/R/G. Lungs:  Decreased BS at the bases. Abdomen:  Soft, NT, distended, hypertympanic but +BS. Musculoskeletal:  -edema and +tenderness at toes. Skin:  Intact.  Labs and Imaging:   Labs: CBC    Component Value Date/Time   WBC 18.5* 11/16/2011 0605   RBC 4.48 11/16/2011 0605   HGB 12.1* 11/16/2011 0605   HCT 38.6* 11/16/2011 0605   PLT 378 11/16/2011 0605   MCV 86.2 11/16/2011 0605   MCH 27.0 11/16/2011 0605   MCHC 31.3 11/16/2011 0605   RDW 13.9 11/16/2011 0605   LYMPHSABS 2.5 04/01/2011 0635   MONOABS 0.9 04/01/2011 0635   EOSABS 0.2 04/01/2011 0635   BASOSABS 0.0 04/01/2011 0635    BMET    Component Value Date/Time   NA 138 11/16/2011 0605   K 4.6 11/16/2011 0605   CL 94* 11/16/2011 0605   CO2 34* 11/16/2011 0605   GLUCOSE 60* 11/16/2011 0605   BUN 40* 11/16/2011 0605   CREATININE 1.85* 11/16/2011 0605   CALCIUM 9.5 11/16/2011 0605   GFRNONAA 38* 11/16/2011 0605   GFRAA 44* 11/16/2011 0605   ABG    Component Value Date/Time   HCO3 26.4* 11/21/2010 1149   TCO2 28 11/21/2010 1149   O2SAT 94.0 11/21/2010 1149     Lab 11/14/11 0342  MG 1.9   Lab Results  Component Value Date   CALCIUM 9.5 11/16/2011   PHOS 4.9* 04/01/2011    Assessment and Plan: 39 M with PMH of OSA/OHV who presents to the hospital with pulmonary edema resulting in hypoxemia.  More importantly however, the patient is somnolent this AM.  The patient has had no previous ABG's but pH is likely 7.4 because the patient is hypoxic resulting in hyperventilation.  However, CO2 is also elevated.  The combination of hypercarbia and hypoxemia can result in altered mental status.  The patient also has been non-compliant with CPAP. Plan: Extensive couselling to encourage CPAP use and weight loss.  Will give a single dose of diamox to see if Bicarb can be dropped by inducing metabolic acidosis.  Titrate O2 for sat of 88-92%.  IS per RT protocol.  Ambulation as tolerated (gout is  noted however).  Will need full PFTs when improved.  Will need outpatient follow up upon discharge.  Will follow BMP in AM.  CHF treatment per cards.  Koren Bound, M.D. Pulmonary and Critical Care Medicine [facility] [phone]  11/16/2011, 12:08 PM

## 2011-11-17 ENCOUNTER — Inpatient Hospital Stay (HOSPITAL_COMMUNITY): Payer: BC Managed Care – PPO

## 2011-11-17 DIAGNOSIS — R0602 Shortness of breath: Secondary | ICD-10-CM

## 2011-11-17 DIAGNOSIS — M109 Gout, unspecified: Secondary | ICD-10-CM

## 2011-11-17 DIAGNOSIS — I5023 Acute on chronic systolic (congestive) heart failure: Secondary | ICD-10-CM

## 2011-11-17 DIAGNOSIS — I359 Nonrheumatic aortic valve disorder, unspecified: Principal | ICD-10-CM

## 2011-11-17 LAB — BASIC METABOLIC PANEL
BUN: 47 mg/dL — ABNORMAL HIGH (ref 6–23)
Calcium: 9.8 mg/dL (ref 8.4–10.5)
GFR calc non Af Amer: 56 mL/min — ABNORMAL LOW (ref >90)
Glucose, Bld: 39 mg/dL — CL (ref 70–99)
Sodium: 140 mEq/L (ref 135–145)

## 2011-11-17 LAB — GLUCOSE, CAPILLARY
Glucose-Capillary: 96 mg/dL (ref 70–99)
Glucose-Capillary: 66 mg/dL — ABNORMAL LOW (ref 70–99)

## 2011-11-17 LAB — CBC
MCH: 26.9 pg (ref 26.0–34.0)
MCHC: 31.6 g/dL (ref 30.0–36.0)
Platelets: 359 10*3/uL (ref 150–400)
RDW: 13.8 % (ref 11.5–15.5)

## 2011-11-17 MED ORDER — INSULIN GLARGINE 100 UNIT/ML ~~LOC~~ SOLN
25.0000 [IU] | Freq: Every day | SUBCUTANEOUS | Status: DC
  Administered 2011-11-18 – 2011-11-25 (×8): 25 [IU] via SUBCUTANEOUS
  Filled 2011-11-17: qty 3

## 2011-11-17 MED ORDER — DEXTROSE 50 % IV SOLN
INTRAVENOUS | Status: AC
  Administered 2011-11-17: 50 mL
  Filled 2011-11-17: qty 50

## 2011-11-17 NOTE — Progress Notes (Signed)
SUBJECTIVE:  He is awake and reports that his feet are better but still tender. Denies SOB.   PHYSICAL EXAM Filed Vitals:   11/16/11 0533 11/16/11 1445 11/16/11 2116 11/17/11 0638  BP: 120/76 134/64 119/81 109/73  Pulse: 104 83 89 96  Temp: 98.1 F (36.7 C) 98.5 F (36.9 C) 98.2 F (36.8 C) 97.7 F (36.5 C)  TempSrc:  Oral Oral Oral  Resp: 22 22 18 14   Height:      Weight: 165.9 kg (365 lb 11.9 oz)     SpO2: 90% 96% 95% 93%   General:  No acute distress Lungs:  Decreased breath sounds. No wheezing Heart:  Systolic murmur Abdomen:  Obese, positive bowel sounds Extremities:  Moderate edema.  Mild tenderness bilateral feet.    HEENT:  PEERL Neuro:  Intact  LABS: Lab Results  Component Value Date   CKTOTAL 132 03/31/2011   CKMB 4.6* 03/31/2011   TROPONINI  Value: <0.30        Due to the release kinetics of cTnI, a negative result within the first hours of the onset of symptoms does not rule out myocardial infarction with certainty. If myocardial infarction is still suspected, repeat the test at appropriate intervals. 03/31/2011   Results for orders placed during the hospital encounter of 11/13/11 (from the past 24 hour(s))  GLUCOSE, CAPILLARY     Status: Normal   Collection Time   11/16/11 11:17 AM      Component Value Range   Glucose-Capillary 96  70 - 99 (mg/dL)   Comment 1 Notify RN    BLOOD GAS, ARTERIAL     Status: Abnormal   Collection Time   11/16/11 12:45 PM      Component Value Range   FIO2 0.21     Delivery systems ROOM AIR     pH, Arterial 7.401  7.350 - 7.450    pCO2 arterial 54.8 (*) 35.0 - 45.0 (mmHg)   pO2, Arterial 47.4 (*) 80.0 - 100.0 (mmHg)   Bicarbonate 33.3 (*) 20.0 - 24.0 (mEq/L)   TCO2 35.0  0 - 100 (mmol/L)   Acid-Base Excess 8.4 (*) 0.0 - 2.0 (mmol/L)   O2 Saturation 81.0     Patient temperature 98.6     Collection site RIGHT RADIAL     Drawn by [id number]     Sample type ARTERIAL DRAW     Allens test (pass/fail) PASS  PASS   GLUCOSE, CAPILLARY      Status: Abnormal   Collection Time   11/16/11  3:53 PM      Component Value Range   Glucose-Capillary 101 (*) 70 - 99 (mg/dL)   Comment 1 Notify RN     Comment 2 Documented in Chart    GLUCOSE, CAPILLARY     Status: Abnormal   Collection Time   11/16/11  9:44 PM      Component Value Range   Glucose-Capillary 102 (*) 70 - 99 (mg/dL)   Comment 1 Notify RN     Comment 2 Documented in Chart    BASIC METABOLIC PANEL     Status: Abnormal   Collection Time   11/17/11  6:00 AM      Component Value Range   Sodium 140  135 - 145 (mEq/L)   Potassium 3.9  3.5 - 5.1 (mEq/L)   Chloride 96  96 - 112 (mEq/L)   CO2 32  19 - 32 (mEq/L)   Glucose, Bld 39 (*) 70 - 99 (mg/dL)  BUN 47 (*) 6 - 23 (mg/dL)   Creatinine, Ser 2.13  0.50 - 1.35 (mg/dL)   Calcium 9.8  8.4 - 08.6 (mg/dL)   GFR calc non Af Amer 56 (*) >90 (mL/min)   GFR calc Af Amer 65 (*) >90 (mL/min)  CBC     Status: Abnormal   Collection Time   11/17/11  6:00 AM      Component Value Range   WBC 11.8 (*) 4.0 - 10.5 (K/uL)   RBC 4.49  4.22 - 5.81 (MIL/uL)   Hemoglobin 12.1 (*) 13.0 - 17.0 (g/dL)   HCT 57.8 (*) 46.9 - 52.0 (%)   MCV 85.3  78.0 - 100.0 (fL)   MCH 26.9  26.0 - 34.0 (pg)   MCHC 31.6  30.0 - 36.0 (g/dL)   RDW 62.9  52.8 - 41.3 (%)   Platelets 359  150 - 400 (K/uL)    Intake/Output Summary (Last 24 hours) at 11/17/11 0745 Last data filed at 11/16/11 1326  Gross per 24 hour  Intake    600 ml  Output    100 ml  Net    500 ml    ASSESSMENT AND PLAN:  1. Acute on chronic combined systolic and diastolic heart failure: Lasix held yesterday and lisinopril has been stopped.  I will hold again today but likely restart lasix in the am. 2. Severe aortic valve stenosis: Echo demonstrates severe AS with peak gradient of and mean gradient of 41 mmHg. Dr. Donata Clay saw him yesterday and agrees that a valve procedure is indicated when the medical issues are better controlled.  Dr Donata Clay ordered lower extremity venous  Doppler, PFTs and orthopantogram.  He couldn't stand for the latter as he was too asleep.  We will try again 3. OSA: patient has refused to wear CPAP at home. The patient was seen by Dr. Molli Knock and medications were adjusted.  Given diamox. CPAP, incentive spirometry also ordered.  He again refused CPAP but says he will try if his wife stays with him. 4. DM: Episodes of hypoglycemia.  Lower Lantus again (it was lowered already once.) 5. HLD: Continue statin.  6. Constipation: Miralax prn.  7. Elevated WBC count:  WBC is decreased today.  No infiltrate on CXR yesterday.  No fever.  Continue to follow.   8. Acute renal failure: Likely secondary to over diuresis. Creat improved today.    9. Gout:  On colchicine. Less pain in his feet continue. 10. Dispo: Will need several more days of hospitalization.      Fayrene Fearing [facility] 11/17/2011 7:45 AM

## 2011-11-17 NOTE — Progress Notes (Signed)
Name: Antonio [name]. MRN: [id number] DOB: Aug 25, 1951    LOS: 4  PCCM Progress NOTE  Brief patient profile: 60yowm, morbid obesity and OSA as well as extensive cardiac history including severe AS, HTN and CHF.  Patient was admitted from cardiology's office with the chief complaint of SOB on 1/15  Had a sleep study that revealed severe OSA but in non-compliant with CPAP with  pulmonary artery pressure is normal with normal RV function. Pulmonary asked to see 1/18 for resp failure.  Lines / Drains:   Micro: MRSA screen 1/15 > neg  Antibiotics: None  Tests / Events:    Intake/Output Summary (Last 24 hours) at 11/17/11 1346 Last data filed at 11/17/11 0900  Gross per 24 hour  Intake    360 ml  Output    700 ml  Net   -340 ml   Physical Examination: General:  Morbidly obese, somnolent but easily arousable and interactive. Neuro:  Somnolent but easily arousable, moving all ext. HEENT:  [place]/AT, PERRL, EOM-I and dry MM. Neck:  Supple, -LAN and -thyromegally.   Cardiovascular:  RRR, Nl S1/S2, -M/R/G. Lungs:  Decreased BS at the bases. Abdomen:  Soft, NT, distended, hypertympanic but +BS. Musculoskeletal:  -edema and +tenderness at toes. Skin:  Intact.   Labs:  Lab 11/17/11 0600 11/16/11 0605 11/15/11 0355  NA 140 138 138  K 3.9 4.6 3.6  CL 96 94* 95*  CO2 32 34* 34*  BUN 47* 40* 25*  CREATININE 1.33 1.85* 1.04  GLUCOSE 39* 60* 112*    Lab 11/17/11 0600 11/16/11 0605 11/15/11 0355  HGB 12.1* 12.1* 12.1*  HCT 38.3* 38.6* 38.6*  WBC 11.8* 18.5* 14.4*  PLT 359 378 378         Assessment and Plan:  Acute on chronic Respiratory failure, hypercarbic and hypoxemic due to ohs/osa and ? copd - no further recs - needs to wear his cpap   Renal insufficiency off acei Lab Results  Component Value Date   CREATININE 1.33 11/17/2011   CREATININE 1.85* 11/16/2011   CREATININE 1.04 11/15/2011   would be extremely cautious with acei here ??Role in AS with limited CO (and  therefore limited renal perfusion) does not appear to need much more diuresis at this point  Aortic Stenosis, severe - obviously no good medical treatment here and may be pushed to pursue avr as long as pt and fm realistic re high risks of procedure and T surgery willing to committ to it with likely outcome of vent dep/ trach dep as part of his convalescence     . Sandrea Hughs, MD Pulmonary and Critical Care Medicine [facility] Cell [phone]

## 2011-11-17 NOTE — Progress Notes (Signed)
*  PRELIMINARY RESULTS*  BLEV has been performed.  No obvious evidence of deep vein thrombosis bilaterally. No evidence of Baker's cyst bilaterally.  Mila Homer 11/17/2011, 9:59 AM

## 2011-11-17 NOTE — Progress Notes (Signed)
RT Note: Pt refused to wear CPAP.

## 2011-11-18 DIAGNOSIS — R0902 Hypoxemia: Secondary | ICD-10-CM

## 2011-11-18 DIAGNOSIS — R4182 Altered mental status, unspecified: Secondary | ICD-10-CM

## 2011-11-18 LAB — BASIC METABOLIC PANEL
BUN: 41 mg/dL — ABNORMAL HIGH (ref 6–23)
GFR calc Af Amer: 84 mL/min — ABNORMAL LOW (ref >90)
GFR calc non Af Amer: 72 mL/min — ABNORMAL LOW (ref >90)
Potassium: 4.3 mEq/L (ref 3.5–5.1)
Sodium: 135 mEq/L (ref 135–145)

## 2011-11-18 LAB — GLUCOSE, CAPILLARY: Glucose-Capillary: 116 mg/dL — ABNORMAL HIGH (ref 70–99)

## 2011-11-18 LAB — CBC
Hemoglobin: 11.5 g/dL — ABNORMAL LOW (ref 13.0–17.0)
MCHC: 32.3 g/dL (ref 30.0–36.0)
WBC: 10.5 10*3/uL (ref 4.0–10.5)

## 2011-11-18 MED ORDER — FUROSEMIDE 20 MG PO TABS
20.0000 mg | ORAL_TABLET | Freq: Every day | ORAL | Status: DC
  Administered 2011-11-18 – 2011-11-20 (×3): 20 mg via ORAL
  Filled 2011-11-18 (×4): qty 1

## 2011-11-18 NOTE — Progress Notes (Signed)
Name: Antonio [name]. MRN: [id number] DOB: [birth year]    LOS: 5  PCCM Progress NOTE  Brief patient profile: 60yowm, morbid obesity and OSA as well as extensive cardiac history including severe AS, HTN and CHF.  Patient was admitted from cardiology's office with the chief complaint of SOB on 1/15  Had a sleep study that revealed severe OSA but  non-compliant with CPAP with  pulmonary artery pressure is normal with normal RV function by echo 1/15 - Pulmonary asked to see 1/18 for resp failure.      Micro: MRSA screen 1/15 > neg  Antibiotics: None  Tests / Events:  Overnight Only used the cpap until around 2pm, not really comfortable with it  Intake/Output Summary (Last 24 hours) at 11/18/11 1256 Last data filed at 11/18/11 0900  Gross per 24 hour  Intake    598 ml  Output   1725 ml  Net  -1127 ml   Physical Examination: General:  Morbidly obese alert and approp HEENT:  [place]/AT, PERRL, EOM-I and dry MM. Neck:  Supple, -LAN and -thyromegally.   Cardiovascular:  RRR, Nl S1/S2,  III/VI SEM Lungs:  Decreased BS at the bases. Abdomen:  Soft, NT, distended, hypertympanic but +BS. Musculoskeletal:  -edema and +tenderness at toes. Skin:  Intact.   Labs:  Lab 11/18/11 0500 11/17/11 0600 11/16/11 0605  NA 135 140 138  K 4.3 3.9 4.6  CL 95* 96 94*  CO2 30 32 34*  BUN 41* 47* 40*  CREATININE 1.08 1.33 1.85*  GLUCOSE 118* 39* 60*    Lab 11/18/11 0500 11/17/11 0600 11/16/11 0605  HGB 11.5* 12.1* 12.1*  HCT 35.6* 38.3* 38.6*  WBC 10.5 11.8* 18.5*  PLT 367 359 378         Assessment and Plan:  Acute on chronic Respiratory failure, hypercarbic and hypoxemic due to ohs/osa and ? copd - no further recs - needs to wear his cpap as much as possible sleeping, will ask one of our sleep doctors to take a look at his set up   Renal insufficiency  off acei now, improved off lasix Lab Results  Component Value Date   CREATININE 1.08 11/18/2011   CREATININE 1.33 11/17/2011   CREATININE 1.85* 11/16/2011   would be extremely cautious with acei here ??Role in AS with limited CO (and therefore limited renal perfusion)  - his ventricle may be very preload dependent for adequate CO so advise caution with lasix as well   Aortic Stenosis, severe - obviously no good medical treatment here and may be pushed to pursue avr as long as pt and fm realistic re high risks of procedure and T surgery willing to committ to it with likely outcome of vent dep/ trach dep as part of his convalescence     . Sandrea Hughs, MD Pulmonary and Critical Care Medicine [facility] Cell [phone]

## 2011-11-18 NOTE — Progress Notes (Signed)
SUBJECTIVE:  He is awake and reports that his feet are better but still tender. Denies SOB.   PHYSICAL EXAM Filed Vitals:   11/17/11 2100 11/17/11 2319 11/18/11 0220 11/18/11 0635  BP: 111/77   116/78  Pulse: 111 85  96  Temp: 97.5 F (36.4 C)   97.1 F (36.2 C)  TempSrc: Oral   Oral  Resp: 18 14  20   Height:      Weight:      SpO2: 93% 91% 92% 93%   General:  No acute distress Lungs:  Decreased breath sounds. No wheezing Heart:  Systolic murmur Abdomen:  Obese, positive bowel sounds Extremities:  Moderate edema.  Mild tenderness bilateral feet.    HEENT:  PEERL Neuro:  Intact  LABS:  Results for orders placed during the hospital encounter of 11/13/11 (from the past 24 hour(s))  GLUCOSE, CAPILLARY     Status: Abnormal   Collection Time   11/17/11  7:42 AM      Component Value Range   Glucose-Capillary 34 (*) 70 - 99 (mg/dL)   Comment 1 Documented in Chart     Comment 2 Notify RN    GLUCOSE, CAPILLARY     Status: Normal   Collection Time   11/17/11  8:03 AM      Component Value Range   Glucose-Capillary 99  70 - 99 (mg/dL)   Comment 1 Documented in Chart     Comment 2 Notify RN    GLUCOSE, CAPILLARY     Status: Normal   Collection Time   11/17/11 11:35 AM      Component Value Range   Glucose-Capillary 96  70 - 99 (mg/dL)   Comment 1 Notify RN     Comment 2 Documented in Chart    GLUCOSE, CAPILLARY     Status: Abnormal   Collection Time   11/17/11  4:20 PM      Component Value Range   Glucose-Capillary 66 (*) 70 - 99 (mg/dL)   Comment 1 Documented in Chart     Comment 2 Notify RN    GLUCOSE, CAPILLARY     Status: Abnormal   Collection Time   11/17/11  6:06 PM      Component Value Range   Glucose-Capillary 152 (*) 70 - 99 (mg/dL)   Comment 1 Documented in Chart     Comment 2 Notify RN    GLUCOSE, CAPILLARY     Status: Abnormal   Collection Time   11/17/11 10:32 PM      Component Value Range   Glucose-Capillary 102 (*) 70 - 99 (mg/dL)   Comment 1 Notify  RN     Comment 2 Documented in Chart    CBC     Status: Abnormal   Collection Time   11/18/11  5:00 AM      Component Value Range   WBC 10.5  4.0 - 10.5 (K/uL)   RBC 4.16 (*) 4.22 - 5.81 (MIL/uL)   Hemoglobin 11.5 (*) 13.0 - 17.0 (g/dL)   HCT 16.1 (*) 09.6 - 52.0 (%)   MCV 85.6  78.0 - 100.0 (fL)   MCH 27.6  26.0 - 34.0 (pg)   MCHC 32.3  30.0 - 36.0 (g/dL)   RDW 04.5  40.9 - 81.1 (%)   Platelets 367  150 - 400 (K/uL)  GLUCOSE, CAPILLARY     Status: Abnormal   Collection Time   11/18/11  6:07 AM      Component Value Range  Glucose-Capillary 116 (*) 70 - 99 (mg/dL)    Intake/Output Summary (Last 24 hours) at 11/18/11 0735 Last data filed at 11/18/11 0120  Gross per 24 hour  Intake    718 ml  Output   1725 ml  Net  -1007 ml    ASSESSMENT AND PLAN:  1. Acute on chronic combined systolic and diastolic heart failure: Lasix held for two days and lisinopril has been stopped. I will restart a low dose of Lasix today.  2. Severe aortic valve stenosis: Echo demonstrates severe AS with peak gradient of and mean gradient of 41 mmHg. Dr. Donata Clay saw on Friday and agrees that a valve procedure is indicated when the medical issues are better controlled.  Dr Donata Clay ordered lower extremity venous Doppler, PFTs and orthopantogram.  Doppler prelim without DVT.  Orthopantogram without periodontal disease though a limited study.  PTS not done yet.  3. OSA: patient wore CPAP for a couple of hours last night and slept well  4. DM: No further episodes of hypoglycemia since Lantus lowered yesterday.  Continue current therapy.  5. Acute renal failure: Likely secondary to over diuresis. Creat pending at the time of this note but ordered.  I will review when available.  6. Gout:  On colchicine. Less pain in his feet continue.  7. Dispo: Will need several more days of hospitalization. Needs to be more ambulatory.     Rollene Rotunda 11/18/2011 7:35 AM

## 2011-11-19 DIAGNOSIS — R4182 Altered mental status, unspecified: Secondary | ICD-10-CM

## 2011-11-19 DIAGNOSIS — R0902 Hypoxemia: Secondary | ICD-10-CM

## 2011-11-19 DIAGNOSIS — G4710 Hypersomnia, unspecified: Secondary | ICD-10-CM

## 2011-11-19 DIAGNOSIS — G4730 Sleep apnea, unspecified: Secondary | ICD-10-CM

## 2011-11-19 LAB — GLUCOSE, CAPILLARY
Glucose-Capillary: 92 mg/dL (ref 70–99)
Glucose-Capillary: 142 mg/dL — ABNORMAL HIGH (ref 70–99)
Glucose-Capillary: 214 mg/dL — ABNORMAL HIGH (ref 70–99)

## 2011-11-19 LAB — BASIC METABOLIC PANEL
CO2: 33 mEq/L — ABNORMAL HIGH (ref 19–32)
Calcium: 9.6 mg/dL (ref 8.4–10.5)
Creatinine, Ser: 1.02 mg/dL (ref 0.50–1.35)
Glucose, Bld: 147 mg/dL — ABNORMAL HIGH (ref 70–99)

## 2011-11-19 NOTE — Progress Notes (Signed)
SUBJECTIVE: Breathing better. NO chest pain. No events.   BP 107/74  Pulse 86  Temp(Src) 97.4 F (36.3 C) (Oral)  Resp 20  Ht 6\' 1"  (1.854 m)  Wt 356 lb 0.7 oz (161.5 kg)  BMI 46.97 kg/m2  SpO2 93%  Intake/Output Summary (Last 24 hours) at 11/19/11 0737 Last data filed at 11/19/11 0300  Gross per 24 hour  Intake    720 ml  Output   2475 ml  Net  -1755 ml    PHYSICAL EXAM General: Well developed, well nourished, in no acute distress. Alert and oriented x 3.  Psych:  Good affect, responds appropriately Neck:. No masses noted.  Lungs: Clear bilaterally with no wheezes or rhonci noted.  Heart: RRR with loud systolic murmur noted. Abdomen: Bowel sounds are present. Soft, non-tender.  Extremities: Trace lower extremity edema.   LABS: Basic Metabolic Panel:  Basename 11/19/11 0535 11/18/11 0500  NA 137 135  K 4.5 4.3  CL 98 95*  CO2 33* 30  GLUCOSE 147* 118*  BUN 34* 41*  CREATININE 1.02 1.08  CALCIUM 9.6 9.5  MG -- --  PHOS -- --   CBC:  Basename 11/18/11 0500 11/17/11 0600  WBC 10.5 11.8*  NEUTROABS -- --  HGB 11.5* 12.1*  HCT 35.6* 38.3*  MCV 85.6 85.3  PLT 367 359    Current Meds:    . aspirin EC  81 mg Oral Daily  . carvedilol  6.25 mg Oral BID WC  . colchicine  0.6 mg Oral BID  . enoxaparin  80 mg Subcutaneous QHS  . fenofibrate  160 mg Oral Daily  . furosemide  20 mg Oral Daily  . glimepiride  4 mg Oral BID WC  . insulin aspart  0-15 Units Subcutaneous TID WC  . insulin glargine  25 Units Subcutaneous QHS  . potassium chloride  40 mEq Oral Daily  . rosuvastatin  20 mg Oral q1800  . sodium chloride  3 mL Intravenous Q12H     ASSESSMENT AND PLAN:  1. Acute on chronic combined systolic and diastolic heart failure: Lasix held for two days and now back on low dose. Lisinopril has been stopped. He continues to diurese well. Volume is much closer to baseline.   2. Severe aortic valve stenosis: Echo demonstrates severe AS with peak gradient of  and mean gradient of 41 mmHg. Dr. Donata Clay saw on Friday and agrees that a valve procedure is indicated when the medical issues are better controlled. Dr Donata Clay ordered lower extremity venous Doppler, PFTs and orthopantogram. Doppler without DVT. Orthopantogram without periodontal disease though a limited study. Will arrange right heart cath this week.   3. OSA: Appreciate pulmonary recs. Pt educated on importance of CPAP. Patient wore CPAP for a couple of hours yesterday. He is slowly learning to tolerate.    4. DM: No further episodes of hypoglycemia since Lantus lowered. Continue current therapy.   5. Acute renal failure: Likely secondary to over diuresis. Now normal.   6. Gout: On colchicine. Less pain in his feet continue.   7. Dispo: Will need several more days of hospitalization. Needs to be more ambulatory. Will get PT consult today.     MCALHANY,CHRISTOPHER  1/21/20137:37 AM

## 2011-11-19 NOTE — Progress Notes (Signed)
Inpatient Diabetes Program Recommendations  AACE/ADA: New Consensus Statement on Inpatient Glycemic Control (2009)  Target Ranges:  Prepandial:   less than 140 mg/dL      Peak postprandial:   less than 180 mg/dL (1-2 hours)      Critically ill patients:  140 - 180 mg/dL   Reason for Visit: Elevated HgbA1C of 8.5 %  Inpatient Diabetes Program Recommendations Insulin - Basal: xxx Diet: Please change to carbohydrate modifed - medium. This includes a heart healthy component (however, HHealthy does not include carb modified).  Note: Noted history of "binge eating"..would recommend to her primary MD to add Byetta (twice a day) or Victoza (once a day).  Would help with post-prandial glucose control as well as control with "binge eating". Thank you, Lenor Coffin, RN, CNS, Diabetes Coordinator [phone])

## 2011-11-19 NOTE — Progress Notes (Signed)
11/19/11 1530 UR Completed. Tera Mater, RN, BSN

## 2011-11-19 NOTE — Progress Notes (Signed)
Name: Antonio [name]. MRN: [id number] DOB: Nov 19, 1950    LOS: 6  PCCM Progress NOTE  Brief patient profile: 60yowm, morbid obesity and OSA as well as extensive cardiac history including severe AS, HTN and CHF.  Patient was admitted from cardiology's office with the chief complaint of SOB on 1/15  Had a sleep study that revealed severe OSA but  non-compliant with CPAP with  pulmonary artery pressure is normal with normal RV function by echo 1/15 - Pulmonary asked to see 1/18 for resp failure.   Micro: MRSA screen 1/15 > neg  Antibiotics: None  Tests / Events:  Overnight Didn't wear CPAP last night, but willing to retry tonight   Intake/Output Summary (Last 24 hours) at 11/19/11 1204 Last data filed at 11/19/11 0850  Gross per 24 hour  Intake    720 ml  Output   2250 ml  Net  -1530 ml   Physical Examination: General:  Morbidly obese alert and approp HEENT:  [place]/AT, PERRL, EOM-I and dry MM. Neck:  Supple, -LAN and -thyromegally.   Cardiovascular:  RRR, Nl S1/S2,  III/VI SEM Lungs:  Decreased BS at the bases. Abdomen:  Soft, NT, distended, hypertympanic but +BS. Musculoskeletal:  -edema and +tenderness at toes. Skin:  Intact.   Labs:  Lab 11/19/11 0535 11/18/11 0500 11/17/11 0600  NA 137 135 140  K 4.5 4.3 3.9  CL 98 95* 96  CO2 33* 30 32  BUN 34* 41* 47*  CREATININE 1.02 1.08 1.33  GLUCOSE 147* 118* 39*    Lab 11/18/11 0500 11/17/11 0600 11/16/11 0605  HGB 11.5* 12.1* 12.1*  HCT 35.6* 38.3* 38.6*  WBC 10.5 11.8* 18.5*  PLT 367 359 378   Assessment and Plan:  Acute on chronic Respiratory failure, hypercarbic and hypoxemic due to ohs/osa and ? copd - need to keep working on CPAP use and compliance, he is going to try again tonight  Renal insufficiency  Lab Results  Component Value Date   CREATININE 1.02 11/19/2011   CREATININE 1.08 11/18/2011   CREATININE 1.33 11/17/2011  - would be extremely cautious with acei here ??Role in AS with limited CO (and  therefore limited renal perfusion)  - his ventricle may be very preload dependent for adequate CO so advise caution with lasix as well  Aortic Stenosis, severe - obviously no good medical treatment here and may be pushed to pursue avr as long as pt and fm realistic re high risks of procedure and T surgery willing to commit to it with likely outcome of vent dep/ trach dep as part of his convalescence    Levy Pupa, MD, PhD 11/19/2011, 12:12 PM  Pulmonary and Critical Care [phone] or if no answer [phone]

## 2011-11-19 NOTE — Progress Notes (Signed)
                   [address]            [name] [address]          [phone]      Subjective: Mental status better in past 24 hrs Still cannot walk due to gout L>R foot Creat down to 1.3 PFT s not completed, RHC pending Starting to use cpap @ HS  Objective: Vital signs in last 24 hours: Temp:  [97.4 F (36.3 C)-98.1 F (36.7 C)] 98.1 F (36.7 C) (01/21 1444) Pulse Rate:  [83-94] 94  (01/21 1444) Cardiac Rhythm:  [-] Normal sinus rhythm;Heart block (01/21 0541) Resp:  [18-20] 20  (01/21 1444) BP: (103-141)/(73-94) 141/94 mmHg (01/21 1444) SpO2:  [92 %-95 %] 94 % (01/21 1444) Weight:  [356 lb 0.7 oz (161.5 kg)] 356 lb 0.7 oz (161.5 kg) (01/21 0541)  Hemodynamic parameters for last 24 hours:    Intake/Output from previous day: 01/20 0701 - 01/21 0700 In: 720 [P.O.:720] Out: 2475 [Urine:2475] Intake/Output this shift: Total I/O In: 500 [P.O.:500] Out: 475 [Urine:475]  Exam  NSR, HIGH PITCHED MURMUR            Edematous,erythematous feet  Lab Results:  Basename 11/18/11 0500 11/17/11 0600  WBC 10.5 11.8*  HGB 11.5* 12.1*  HCT 35.6* 38.3*  PLT 367 359   BMET:  Basename 11/19/11 0535 11/18/11 0500  NA 137 135  K 4.5 4.3  CL 98 95*  CO2 33* 30  GLUCOSE 147* 118*  BUN 34* 41*  CREATININE 1.02 1.08  CALCIUM 9.6 9.5    PT/INR: No results found for this basename: LABPROT,INR in the last 72 hours ABG    Component Value Date/Time   PHART 7.401 11/16/2011 1245   HCO3 33.3* 11/16/2011 1245   TCO2 35.0 11/16/2011 1245   O2SAT 81.0 11/16/2011 1245   CBG (last 3)   Basename 11/19/11 1126 11/19/11 0759 11/18/11 2127  GLUCAP 92 115* 216*    Assessment/Plan: S/P   RHC, --   consider BAV so patient could lose some weight in a structured program and reduce pulmonary complications before conventional AVR    LOS: 6 days    VAN TRIGT III,Mileah Hemmer 11/19/2011

## 2011-11-20 ENCOUNTER — Inpatient Hospital Stay (HOSPITAL_COMMUNITY): Payer: BC Managed Care – PPO

## 2011-11-20 ENCOUNTER — Other Ambulatory Visit (HOSPITAL_COMMUNITY): Payer: Self-pay | Admitting: Radiology

## 2011-11-20 LAB — BLOOD GAS, ARTERIAL
Acid-Base Excess: 6 mmol/L — ABNORMAL HIGH (ref 0.0–2.0)
Bicarbonate: 30.5 mEq/L — ABNORMAL HIGH (ref 20.0–24.0)
Drawn by: 24610
O2 Content: 3 L/min
O2 Saturation: 92.1 %
Patient temperature: 98.6
TCO2: 32 mmol/L (ref 0–100)
pCO2 arterial: 48.6 mmHg — ABNORMAL HIGH (ref 35.0–45.0)
pH, Arterial: 7.414 (ref 7.350–7.450)
pO2, Arterial: 70.2 mmHg — ABNORMAL LOW (ref 80.0–100.0)

## 2011-11-20 LAB — CBC
Platelets: 388 10*3/uL (ref 150–400)
RBC: 4.53 MIL/uL (ref 4.22–5.81)
WBC: 8.8 10*3/uL (ref 4.0–10.5)

## 2011-11-20 LAB — GLUCOSE, CAPILLARY
Glucose-Capillary: 93 mg/dL (ref 70–99)
Glucose-Capillary: 154 mg/dL — ABNORMAL HIGH (ref 70–99)
Glucose-Capillary: 182 mg/dL — ABNORMAL HIGH (ref 70–99)

## 2011-11-20 LAB — BASIC METABOLIC PANEL
Calcium: 9.7 mg/dL (ref 8.4–10.5)
GFR calc non Af Amer: >90 mL/min (ref >90)
Sodium: 139 mEq/L (ref 135–145)

## 2011-11-20 MED ORDER — CITALOPRAM HYDROBROMIDE 20 MG PO TABS
20.0000 mg | ORAL_TABLET | Freq: Every day | ORAL | Status: DC
  Administered 2011-11-20 – 2011-12-17 (×26): 20 mg via ORAL
  Filled 2011-11-20 (×28): qty 1

## 2011-11-20 MED ORDER — EXENATIDE 5 MCG/0.02ML ~~LOC~~ SOPN
5.0000 ug | PEN_INJECTOR | Freq: Two times a day (BID) | SUBCUTANEOUS | Status: DC
  Administered 2011-11-22 – 2011-11-27 (×10): 5 ug via SUBCUTANEOUS
  Filled 2011-11-20 (×7): qty 0.02

## 2011-11-20 NOTE — Progress Notes (Signed)
Pt had refused to wear CPAP at night on two consecutive nights. 11/18/2011 and 11/19/2011. States :"will wear it during daytime".  Education provided and pt verbalized understanding.

## 2011-11-20 NOTE — Evaluation (Signed)
Physical Therapy Evaluation Patient Details Name: Antonio [name]. MRN: [id number] DOB: [birth year] Today's Date: 11/20/2011  Problem List:  Patient Active Problem List  Diagnoses  . DIABETES MELLITUS, TYPE II, UNCONTROLLED  . HYPERLIPIDEMIA  . Gout, unspecified  . OBESITY, NOS  . HYPERTENSION, BENIGN SYSTEMIC  . AORTIC STENOSIS  . EDEMA-LEGS,DUE TO VENOUS OBSTRUCT.  . OSTEOARTHRITIS OF SPINE, NOS  . BACK PAIN, LUMBAR  . CARDIOMYOPATHY, SECONDARY  . DYSPNEA  . ABNORMAL CV (STRESS) TEST  . Acute on chronic systolic congestive heart failure  . Sleep apnea  . Systolic and diastolic CHF, acute on chronic    Past Medical History:  Past Medical History  Diagnosis Date  . Hepatitis   . Chronic systolic heart failure     echo 12/11: EF 45-50%, grade 1 diast dysfxn, moderately severe AS (AVA 0.9, mean 31 mmHg), mild MR;    cath 1/12:  normal cors, AVA 1.0, peak to peak gradiet for AV 31 mmHg, mod-severe AS, EF 35-40%  . 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 Hx MRSA infection     thigh abcess 11/06  . Anxiety   . Aortic stenosis     echo 12/11: AVA 0.9, mean 31, cath 1/12: AVA 1.0 with peak to peak 31 mmHg  . HTN (hypertension)   . DM2 (diabetes mellitus, type 2)   . HLD (hyperlipidemia)   . Gout   . NICM (nonischemic cardiomyopathy)    Past Surgical History:  Past Surgical History  Procedure Date  . Transthoracic echocardiogram 12/05    EF: 55-65% mild to moderate AS  . Cardiovascular stress test 10/10/04    ? inf. wall defect -  . Laminectomy 10/30/83    PT Assessment/Plan/Recommendation PT Assessment Clinical Impression Statement: Pt adm with heart failure.  Presents to PT with good mobility but limited by fatigue.  No skilled PT needed.  Will refer to mobility team to continue to increase activity level. PT Recommendation/Assessment: Patent does not need any further PT services No Skilled PT: Patient is modified independent with all activity/mobility PT  Recommendation Follow Up Recommendations: No PT follow up Equipment Recommended: None recommended by PT  Prior Functioning  Home Living Lives With: Spouse Type of Home: House Home Layout: Two level;Able to live on main level with bedroom/bathroom Alternate Level Stairs-Rails: Right Alternate Level Stairs-Number of Steps: 4 Home Adaptive Equipment: Straight cane;Walker - rolling Prior Function Level of Independence: Independent with basic ADLs;Independent with transfers;Independent with gait Cognition Cognition Arousal/Alertness: Awake/alert Overall Cognitive Status: Appears within functional limits for tasks assessed Orientation Level: Oriented X4  Extremity Assessment RLE Assessment RLE Assessment: Within Functional Limits LLE Assessment LLE Assessment: Within Functional Limits Mobility (including Balance) Bed Mobility Supine to Sit: 6: Modified independent (Device/Increase time) Transfers Sit to Stand: 7: Independent Stand to Sit: 7: Independent Ambulation/Gait Ambulation/Gait Assistance: 6: Modified independent (Device/Increase time) Ambulation Distance (Feet): 50 Feet Assistive device: None Gait Pattern:  (wide base)  Dynamic Standing Balance Dynamic Standing - Level of Assistance: 6: Modified independent (Device/Increase time) Exercise    End of Session PT - End of Session Activity Tolerance: Patient limited by fatigue Patient left: in bed Nurse Communication: Mobility status for ambulation  Elainah Rhyne 11/20/2011, 2:44 PM  [facility] PT [phone]

## 2011-11-20 NOTE — Progress Notes (Signed)
Name: Antonio [name]. MRN: [id number] DOB: [birth year]    LOS: 7  PCCM Progress NOTE  Brief patient profile: 60yowm, morbid obesity and OSA as well as extensive cardiac history including severe AS, HTN and CHF.  Patient was admitted from cardiology's office with the chief complaint of SOB on 1/15  Had a sleep study that revealed severe OSA but  non-compliant with CPAP with  pulmonary artery pressure is normal with normal RV function by echo 1/15 - Pulmonary asked to see 1/18 for resp failure.   Micro: MRSA screen 1/15 > neg  Antibiotics: None  Tests / Events:  Overnight Didn't wear CPAP last night, but willing to retry tonight   Intake/Output Summary (Last 24 hours) at 11/20/11 0915 Last data filed at 11/20/11 0419  Gross per 24 hour  Intake    620 ml  Output   1075 ml  Net   -455 ml   Physical Examination: General:  Morbidly obese alert and approp HEENT:  [place]/AT, PERRL, EOM-I and dry MM. Neck:  Supple, -LAN and -thyromegally.   Cardiovascular:  RRR, Nl S1/S2,  III/VI SEM Lungs:  Decreased BS at the bases. Abdomen:  Soft, NT, distended, hypertympanic but +BS. Musculoskeletal:  -edema and +tenderness at toes. Skin:  Intact.   Labs:  Lab 11/20/11 0730 11/19/11 0535 11/18/11 0500  NA 139 137 135  K 4.1 4.5 4.3  CL 100 98 95*  CO2 29 33* 30  BUN 23 34* 41*  CREATININE 0.74 1.02 1.08  GLUCOSE 116* 147* 118*    Lab 11/20/11 0730 11/18/11 0500 11/17/11 0600  HGB 12.3* 11.5* 12.1*  HCT 38.5* 35.6* 38.3*  WBC 8.8 10.5 11.8*  PLT 388 367 359   Assessment and Plan:  Acute on chronic Respiratory failure, hypercarbic and hypoxemic due to ohs/osa and ? copd - need to keep working on CPAP use and compliance, he is going to try again tonight 1/22.c Ambien  1/22 due to osa/ohs  Renal insufficiency  Lab Results  Component Value Date   CREATININE 0.74 11/20/2011   CREATININE 1.02 11/19/2011   CREATININE 1.08 11/18/2011  - would be extremely cautious with acei here ??Role  in AS with limited CO (and therefore limited renal perfusion)  - his ventricle may be very preload dependent for adequate CO so advise caution with lasix as well  Aortic Stenosis, severe - obviously no good medical treatment here and may be pushed to pursue avr as long as pt and fm realistic re high risks of procedure and T surgery willing to commit to it with likely outcome of vent dep/ trach dep as part of his convalescence . Consider medical tx and weight prior to surgical intervention.  Gout: -1/22 improved on colchicine   Brett Canales Minor ACNP [facility] P[facility] Pager [phone] till 3 pm If no answer page [phone] 11/20/2011, 9:15 AM  Attending Addendum:  I have seen the patient, discussed the issues, test results and plans with S. Minor. I agree with the Assessment and Plans as outlined above.  Levy Pupa, MD, PhD 11/20/2011, 11:26 AM [facility] [phone] or if no answer [phone]

## 2011-11-20 NOTE — Progress Notes (Signed)
Patient Name: Antonio [name]. Date of Encounter: 11/20/2011    SUBJECTIVE: Pt tells me that his breathing is better today. He is not motivated to move around much. Gout is better in his feet. No events overnight.   OBJECTIVE  Filed Vitals:   11/19/11 0923 11/19/11 1444 11/19/11 2108 11/20/11 0413  BP: 103/74 141/94 104/80 139/90  Pulse: 83 94 66 84  Temp:  98.1 F (36.7 C) 98.2 F (36.8 C) 97.4 F (36.3 C)  TempSrc:  Oral    Resp: 18 20 20 18   Height:      Weight:    327 lb 6.1 oz (148.5 kg)  SpO2: 95% 94% 96% 95%    Intake/Output Summary (Last 24 hours) at 11/20/11 0859 Last data filed at 11/20/11 0419  Gross per 24 hour  Intake    620 ml  Output   1075 ml  Net   -455 ml   Weight change: -28 lb 10.6 oz (-13 kg) Wt Readings from Last 3 Encounters:  11/20/11 327 lb 6.1 oz (148.5 kg)  11/07/11 366 lb (166.017 kg)  05/31/11 358 lb (162.388 kg)     PHYSICAL EXAM  General: Well developed, well nourished, in no acute distress. Head: Normocephalic, atraumatic.  Neck: Supple without bruits. JVD difficult to assess  Lungs:  Resp regular and unlabored, CTA. Heart: RRR. Loud systolic murmur Abdomen: Soft, non-tender, non-distended, BS + x 4.  Extremities: 1-2+ bilateral lower ext edema. Chronic venous stasis changes.  Neuro: Alert and oriented X 3. Moves all extremities spontaneously. Psych: Normal affect.  LABS:  CBC:  Basename 11/20/11 0730 11/18/11 0500  WBC 8.8 10.5  NEUTROABS -- --  HGB 12.3* 11.5*  HCT 38.5* 35.6*  MCV 85.0 85.6  PLT 388 367   Basic Metabolic Panel:  Basename 11/20/11 0730 11/19/11 0535  NA 139 137  K 4.1 4.5  CL 100 98  CO2 29 33*  GLUCOSE 116* 147*  BUN 23 34*  CREATININE 0.74 1.02  CALCIUM 9.7 9.6  MG -- --  PHOS -- --   TELE: NSR.   Radiology/Studies:  Dg Orthopantogram 11/17/2011  *RADIOLOGY REPORT*  Clinical Data: Oral evaluation prior to anticipated valve surgery.  PANOREX ONE-VIEW  Comparison: None.  Findings:  Study is motion limited.  The patient was unable to stand for the examination.  There is prominent midline artifact, limiting evaluation of the central and lateral incisors.  Some of the molars are absent.  No significant periodontal disease is identified. There is no evidence of acute fracture.  The temporal mandibular joints are not imaged.  IMPRESSION: Limited study shows no evidence of significant periodontal disease. Midline evaluation is not possible.  Original Report Authenticated By: Gerrianne Scale, M.D.   Dg Chest 1 View 11/16/2011  *RADIOLOGY REPORT*  Clinical Data: Follow up opacity in the right lung  CHEST - 1 VIEW  Comparison: Chest x-ray of 11/15/2011  Findings: The lungs are not as well aerated.  The vague opacity noted previously the right upper lung field is not seen and may represent resolving pneumonia or edema. There does appear to be minimal pulmonary vascular congestion present.  Cardiomegaly is stable.  IMPRESSION: Stable cardiomegaly.  No definite infiltrate or effusion. Probable minimal pulmonary vascular congestion.  Original Report Authenticated By: Juline Patch, M.D.   Current Medications:     . aspirin EC  81 mg Oral Daily  . carvedilol  6.25 mg Oral BID WC  . colchicine  0.6 mg  Oral BID  . enoxaparin  80 mg Subcutaneous QHS  . fenofibrate  160 mg Oral Daily  . furosemide  20 mg Oral Daily  . glimepiride  4 mg Oral BID WC  . insulin aspart  0-15 Units Subcutaneous TID WC  . insulin glargine  25 Units Subcutaneous QHS  . potassium chloride  40 mEq Oral Daily  . rosuvastatin  20 mg Oral q1800  . sodium chloride  3 mL Intravenous Q12H    ASSESSMENT AND PLAN:   1. Acute on chronic combined systolic and diastolic heart failure: He has diuresed well over the last week (approximately 10 liters) Now on low dose Lasix and tolerating. Overall volume status is difficult to assess. Will arrange right heart cath for Thursday of this week. Lisinopril has been stopped.   2.  Severe aortic valve stenosis: Echo demonstrates severe AS with peak gradient of and mean gradient of 41 mmHg. His aortic stenosis is likely contributing to his decompensation recently. Dr. Donata Clay has assessed for AVR however given patients deconditioning, current respiratory state with hypercarbia, obesity, OSA, an open surgical procedure would be high risk at this time. He would likely not extubate easily post-op. Possibility of balloon aortic valvuloplasty to stabilize pt and optimize for potential traditional AVR in the future. Will discuss with Dr. Excell Seltzer in regards to valvuloplasty.  This would be considered as a bridge procedure to AVR. I have had a long discussion with the patient and his wife this am in regards to above. He needs PT for conditioning, weight loss assistance (? Supervised weight loss program as outpt) and compliance with CPAP.   3. Acute Respiratory Failure/Obesity Hypoventilation syndrome/OSA: Appreciate pulmonary recs. Pt educated on importance of CPAP. Patient refusing to wear CPAP at night. It will be difficult to improve his ventilatory state without compliance.  He has promised me this am that he will be compliant.   4. DM:  Continue current therapy. I have reviewed the recs of the DM coordinator. She has recommended adding Byetta BID to his regimen. Will discuss this am.   5. Acute renal failure: Likely secondary to over diuresis. Resolved.  6. Gout: On colchicine. Improved.  7. Depression: Will add Celexa 20 mg po Qdaily.   8. Dispo: Will need several more days of hospitalization. Needs to be more ambulatory. PT consult requested yesterday. Will ask them to come by today.      MCALHANY,CHRISTOPHER 10:00 AM 11/20/2011

## 2011-11-20 NOTE — Progress Notes (Signed)
Pt discussed with Dr Donata Clay and Dr Clifton James. I have reviewed his echo images and clinical history. The patient has severe aortic stenosis and is not currently a candidate for open surgical AVR because of his respiratory status/obesity hypoventilation/chronic hypercarbic resp failure. We discussed the option of percutaneous balloon aortic valvuloplasty as a temporizing measure to allow him to regain functional capacity and give him a chance to mobilize, lose weight, and become a more viable candidate for definitive AVR. I will discuss further with the patient and his wife and tentatively plan on aortic valvuloplasty later this week or early next week.

## 2011-11-20 NOTE — Progress Notes (Signed)
   CARE MANAGEMENT NOTE HEART FAILURE  11/20/2011   Patient:  Antonio [name], Antonio [name]   Account Number:  [id number]    Date Initiated:  11/16/2011  Documentation initiated by:  Tera Mater  Subjective/Objective Assessment:   60yo male direct admit from cardiology office with SOB, LE edema, orthopnea.  Pt. lives with spouse at home.  HX: CHF, DM, HTN, AS.   Action/Plan:   Spoke with pt. and wife at length about HF book and CPAP use. Pt. has sleep apnea and has a CPAP machine at home, however he refuses to wear it.  Encouraged pt. to use CPAP machine while in hospital.  May benefit from [facility] RN for HF manage.   Anticipated DC Date:  11/19/2011  Anticipated DC Plan:  HOME W HOME HEALTH SERVICES  DC Planning Services:  CM consult    Choice offered to / List presented to:          Status of service:  In process, will continue to follow  Medicare Important Message Given:  NO (If response is "NO", the following Medicare IM given date fields will be blank) Date Medicare IM Given:   Date Additional Medicare IM Given:    Discharge Disposition:    Per UR Regulation:    Comments:   11/20/11 1100 Spoke with pt. at length about using CPAP machine while sleeping.  Pt. has not used machine in 2 nights.  In addition, TCTS has consulted on pt. for possible BAV.  Pt. states he may have the surgery this week.  Looks better and is able to stay awake during a conversation.  NCM will continue to follow. Tera Mater, RN, BSN   11/19/11 1530 UR Completed. Tera Mater, RN, BSN   Initial CM contact:  11/16/2011 03:00 PM  By:  Tera Mater Initial CSW contact:     By:      Is this an INP Readmission < 30 days:  N (If "YES" please see readmission information at the bottom of note)  Patient living status prior to this admission:  FAMILY  Patient setting prior to this admission:  HOME  Comorbid conditions being treated that contributed to this admission:  CHF, AS, DM, HTN  CHF Readmission  Risk:  high  Type of patient education provided  HF Patient Education Assessment / Teach Back  HF Zone Tool / Magnet  Weigh daily  Limit salt intake     Patient education provided by  [facility]    Was referral made to [facility]:  N  Is the patient's PCP the same as attending:  N PCP:    Readmission < 30 Days If pt has HH, did they contact the agency before going to the ED:   Name of [facility] agency:    Was the follow-up physician visit scheduled prior to discharge:    Did the patient follow-up with the physician prior to this readmission:    Was there HF Clinic visits prior to readmission:    Were there ED visits between admissions:    Readmit type:    If unscheduled and related indicate reason for readmit:

## 2011-11-21 ENCOUNTER — Other Ambulatory Visit (HOSPITAL_COMMUNITY): Payer: Self-pay | Admitting: Radiology

## 2011-11-21 ENCOUNTER — Inpatient Hospital Stay (HOSPITAL_COMMUNITY): Payer: BC Managed Care – PPO

## 2011-11-21 LAB — CBC
Hemoglobin: 11.8 g/dL — ABNORMAL LOW (ref 13.0–17.0)
MCH: 26.9 pg (ref 26.0–34.0)
RBC: 4.39 MIL/uL (ref 4.22–5.81)

## 2011-11-21 LAB — BASIC METABOLIC PANEL
GFR calc non Af Amer: >90 mL/min (ref >90)
Glucose, Bld: 77 mg/dL (ref 70–99)
Potassium: 4.3 mEq/L (ref 3.5–5.1)
Sodium: 142 mEq/L (ref 135–145)

## 2011-11-21 LAB — GLUCOSE, CAPILLARY: Glucose-Capillary: 83 mg/dL (ref 70–99)

## 2011-11-21 MED ORDER — CARVEDILOL 12.5 MG PO TABS
12.5000 mg | ORAL_TABLET | Freq: Two times a day (BID) | ORAL | Status: DC
  Administered 2011-11-21 – 2011-11-28 (×15): 12.5 mg via ORAL
  Filled 2011-11-21 (×18): qty 1

## 2011-11-21 MED ORDER — ALBUTEROL SULFATE (5 MG/ML) 0.5% IN NEBU
2.5000 mg | INHALATION_SOLUTION | Freq: Once | RESPIRATORY_TRACT | Status: AC
  Administered 2011-11-21: 2.5 mg via RESPIRATORY_TRACT

## 2011-11-21 MED ORDER — FUROSEMIDE 40 MG PO TABS
40.0000 mg | ORAL_TABLET | Freq: Two times a day (BID) | ORAL | Status: DC
  Administered 2011-11-21 – 2011-11-28 (×15): 40 mg via ORAL
  Filled 2011-11-21 (×19): qty 1

## 2011-11-21 NOTE — Progress Notes (Signed)
SUBJECTIVE: Pt feeling much better. Gout improved. He is now walking. No chest pain. Breathing is better.   BP 144/86  Pulse 84  Temp(Src) 97.9 F (36.6 C) (Oral)  Resp 20  Ht 6\' 1"  (1.854 m)  Wt 349 lb 6.9 oz (158.5 kg)  BMI 46.10 kg/m2  SpO2 93%  Intake/Output Summary (Last 24 hours) at 11/21/11 4098 Last data filed at 11/20/11 2300  Gross per 24 hour  Intake    480 ml  Output   1225 ml  Net   -745 ml    PHYSICAL EXAM General: Well developed, well nourished, in no acute distress. Alert and oriented x 3.  Psych:  Good affect, responds appropriately Neck: No JVD. No masses noted.  Lungs: Clear bilaterally with no wheezes or rhonci noted.  Heart: RRR with loud systolic murmur.  Abdomen: Bowel sounds are present. Soft, non-tender.  Extremities: 1+ bilateral lower ext edema, chronic venous stasis changes.   LABS: Basic Metabolic Panel:  Basename 11/21/11 0605 11/20/11 0730  NA 142 139  K 4.3 4.1  CL 102 100  CO2 32 29  GLUCOSE 77 116*  BUN 19 23  CREATININE 0.84 0.74  CALCIUM 9.9 9.7  MG -- --  PHOS -- --   CBC:  Basename 11/21/11 0605 11/20/11 0730  WBC 9.2 8.8  NEUTROABS -- --  HGB 11.8* 12.3*  HCT 36.9* 38.5*  MCV 84.1 85.0  PLT 395 388    Current Meds:    . aspirin EC  81 mg Oral Daily  . carvedilol  6.25 mg Oral BID WC  . citalopram  20 mg Oral Daily  . colchicine  0.6 mg Oral BID  . enoxaparin  80 mg Subcutaneous QHS  . exenatide  5 mcg Subcutaneous BID WC  . fenofibrate  160 mg Oral Daily  . furosemide  20 mg Oral Daily  . glimepiride  4 mg Oral BID WC  . insulin aspart  0-15 Units Subcutaneous TID WC  . insulin glargine  25 Units Subcutaneous QHS  . potassium chloride  40 mEq Oral Daily  . rosuvastatin  20 mg Oral q1800  . DISCONTD: sodium chloride  3 mL Intravenous Q12H     ASSESSMENT AND PLAN:  1. Acute on chronic combined systolic and diastolic heart failure: He has diuresed well over the last week (approximately 10 liters) Now on  po Lasix and tolerating. Overall volume status is difficult to assess. Will plan right heart cath on Monday at time of balloon aortic valvuloplasty. Lisinopril has been stopped.   2. Severe aortic valve stenosis: Echo demonstrates severe AS with peak gradient of and mean gradient of 41 mmHg. His aortic stenosis is likely contributing to his decompensation recently. Dr. Donata Clay has assessed for AVR however given patients deconditioning, current respiratory state with hypercarbia, obesity, OSA, an open surgical procedure would be high risk at this time. He would likely not extubate easily post-op. Long discussion yesterday with pt and wife with Dr. Excell Seltzer about the possibility of balloon aortic valvuloplasty to stabilize pt and optimize for potential traditional AVR in the future. This would be a bridge procedure to AVR. Will plan for Monday 11/26/11 with Dr. Excell Seltzer. Pt worked with PT yesterday and no f/u recommended. He has not been motivated at this point to walk around much, especially with his gout.  He will need to push his exercise for better conditioning, will need weight loss assistance (? Supervised weight loss program as outpt) and compliance with  CPAP.   3. Acute Respiratory Failure/Obesity Hypoventilation syndrome/OSA: He wore CPAP last night for about five hours. Appreciate pulmonary recs. Weight loss and compliance with CPAP would help with his CO2 retention.   4. DM: Continue current therapy. I have reviewed the recs of the DM coordinator and added Byetta BID to his regimen.   5. Acute renal failure: Likely secondary to over diuresis. Resolved.   6. Gout: On colchicine. Improved.   7. Depression: Celexa 20 mg po Qdaily started 11/20/11.   8. Dispo: Plan percutaneous balloon aortic valvuloplasty on Monday 11/26/11. Will need hospitalization until then. Ambulate in hallways as tolerated.     Antonio [name]  1/23/20138:06 AM

## 2011-11-21 NOTE — Progress Notes (Signed)
Name: Antonio [name]. MRN: [id number] DOB: [birth year]    LOS: 8  PCCM Progress NOTE  Brief patient profile: 61y/o wm, morbid obesity and OSA as well as extensive cardiac history including severe AS, HTN and CHF.  Patient was admitted from cardiology's office with the chief complaint of SOB on 1/15  Had a sleep study that revealed severe OSA but  non-compliant with CPAP with pulmonary artery pressure is normal with normal RV function by echo 1/15 - Pulmonary asked to see 1/18 for resp failure.   Micro: MRSA screen 1/15 > neg  Antibiotics: None  Tests / Events:  Overnight Wore CPAP from 9 pm - 4 am.  -Feels more alert this am.   Intake/Output Summary (Last 24 hours) at 11/21/11 1117 Last data filed at 11/21/11 0854  Gross per 24 hour  Intake    480 ml  Output   1175 ml  Net   -695 ml   Physical Examination: General:  Morbidly obese alert and approp HEENT:  [place]/AT, PERRL, EOM-I and dry MM. Neck:  Supple, -LAN and -thyromegally.   Cardiovascular:  RRR, Nl S1/S2,  III/VI SEM Lungs:  Decreased BS at the bases. Abdomen:  Soft, NT, distended, hypertympanic but +BS. Musculoskeletal:  -edema and +tenderness at toes. Skin:  Intact, mild lower extremity venous changes   Labs:  Lab 11/21/11 0605 11/20/11 0730 11/19/11 0535  NA 142 139 137  K 4.3 4.1 4.5  CL 102 100 98  CO2 32 29 33*  BUN 19 23 34*  CREATININE 0.84 0.74 1.02  GLUCOSE 77 116* 147*    Lab 11/21/11 0605 11/20/11 0730 11/18/11 0500  HGB 11.8* 12.3* 11.5*  HCT 36.9* 38.5* 35.6*  WBC 9.2 8.8 10.5  PLT 395 388 367   Assessment and Plan:  Acute on chronic Respiratory failure, hypercarbic and hypoxemic due to ohs/osa and ? copd - need to keep working on CPAP use and compliance.  Importance of compliance stressed with patient.   -? If will need home machine adjusted upon d/c  -needs to push PT efforts for pulmonary / cardiac benefit   Renal insufficiency  Lab Results  Component Value Date   CREATININE 0.84  11/21/2011   CREATININE 0.74 11/20/2011   CREATININE 1.02 11/19/2011   - would be extremely cautious with ACE-I here-- ??Role in AS with limited CO (and therefore limited renal perfusion)  - his ventricle may be very preload dependent for adequate CO so advise caution with lasix as well  Aortic Stenosis, severe - obviously no good medical treatment here and may be pushed to pursue AVR as long as pt and fm realistic re high risks of procedure and T surgery willing to commit to it with likely outcome of vent dep/ trach dep as part of his convalescence . -Consider medical tx and weight prior to surgical intervention. -pending balloon aortic valvuloplasty 1/28  to optimize for potential traditional AVR in near future   Gout: -1/22 improved on colchicine   Canary Brim, NP-C [facility] Pgr: [phone]  Levy Pupa, MD, PhD 11/21/2011, 3:29 PM [facility] [phone] or if no answer [phone]

## 2011-11-21 NOTE — Progress Notes (Signed)
Nutrition Follow-up  Diet Order:  Carbohydrate Modified, with no added salt, PO intake mostly 100% Patient is scheduled to have R heart cath on Monday 1/28, will stay in hospital until then, per MD note.   Meds: Scheduled Meds:   . aspirin EC  81 mg Oral Daily  . carvedilol  12.5 mg Oral BID WC  . citalopram  20 mg Oral Daily  . colchicine  0.6 mg Oral BID  . enoxaparin  80 mg Subcutaneous QHS  . exenatide  5 mcg Subcutaneous BID WC  . fenofibrate  160 mg Oral Daily  . furosemide  40 mg Oral BID  . glimepiride  4 mg Oral BID WC  . insulin aspart  0-15 Units Subcutaneous TID WC  . insulin glargine  25 Units Subcutaneous QHS  . potassium chloride  40 mEq Oral Daily  . rosuvastatin  20 mg Oral q1800  . DISCONTD: carvedilol  6.25 mg Oral BID WC  . DISCONTD: furosemide  20 mg Oral Daily   Continuous Infusions:  PRN Meds:.acetaminophen, ALPRAZolam, HYDROcodone-acetaminophen, ondansetron (ZOFRAN) IV, polyethylene glycol  Labs:  CMP     Component Value Date/Time   NA 142 11/21/2011 0605   K 4.3 11/21/2011 0605   CL 102 11/21/2011 0605   CO2 32 11/21/2011 0605   GLUCOSE 77 11/21/2011 0605   BUN 19 11/21/2011 0605   CREATININE 0.84 11/21/2011 0605   CALCIUM 9.9 11/21/2011 0605   PROT 7.4 04/01/2011 0635   ALBUMIN 3.4* 04/01/2011 0635   AST 15 04/01/2011 0635   ALT 13 04/01/2011 0635   ALKPHOS 69 04/01/2011 0635   BILITOT 0.4 04/01/2011 0635   GFRNONAA >90 11/21/2011 0605   GFRAA >90 11/21/2011 0605     Intake/Output Summary (Last 24 hours) at 11/21/11 0935 Last data filed at 11/21/11 0854  Gross per 24 hour  Intake    480 ml  Output   1525 ml  Net  -1045 ml    Weight Status:  158.5 kg, trending down related to negative fluid balance. MD note states 10 L of fluid has been removed.   Re-estimated needs:  1900-2000 kcal for weight loss, 110-120 gm protein.   Nutrition Dx:  Not ready for diet/lifestyle change, ongoing  Goal:  Meet minimum needs with PO diet, being met,  continue  Intervention:  No new interventions at this time.   Monitor:  PO intake, weight, labs, I/O's   Rudean Haskell Pager #:  [phone]

## 2011-11-22 LAB — BASIC METABOLIC PANEL
BUN: 18 mg/dL (ref 6–23)
Calcium: 9.7 mg/dL (ref 8.4–10.5)
Creatinine, Ser: 0.74 mg/dL (ref 0.50–1.35)
GFR calc non Af Amer: >90 mL/min (ref >90)
Glucose, Bld: 130 mg/dL — ABNORMAL HIGH (ref 70–99)
Sodium: 138 mEq/L (ref 135–145)

## 2011-11-22 LAB — GLUCOSE, CAPILLARY
Glucose-Capillary: 154 mg/dL — ABNORMAL HIGH (ref 70–99)
Glucose-Capillary: 129 mg/dL — ABNORMAL HIGH (ref 70–99)

## 2011-11-22 NOTE — Progress Notes (Signed)
Cosign for Antonio Paganini RN assessment, med admin, care plan/education, I/O, and notes.

## 2011-11-22 NOTE — Progress Notes (Signed)
Name: Antonio [name]. MRN: [id number] DOB: 1951/07/08    LOS: 9  PCCM Progress NOTE  Brief patient profile: 60y/o wm, morbid obesity and OSA as well as extensive cardiac history including severe AS, HTN and CHF.  Patient was admitted from cardiology's office with the chief complaint of SOB on 1/15  Had a sleep study that revealed severe OSA but  non-compliant with CPAP up to time of admission with pulmonary artery pressure is normal with normal RV function by echo 1/15 - Pulmonary asked to see 1/18 for resp failure.  Micro: MRSA screen 1/15 > neg  Antibiotics: None  Tests / Events:  Overnight Has done very well with CPAP compliance for the last 2 nights   Intake/Output Summary (Last 24 hours) at 11/22/11 1008 Last data filed at 11/22/11 0857  Gross per 24 hour  Intake    720 ml  Output    850 ml  Net   -130 ml   Physical Examination: General:  Morbidly obese alert and approp HEENT:  [name]/AT, PERRL, EOM-I and dry MM. Neck:  Supple, -LAN and -thyromegally.   Cardiovascular:  RRR, Nl S1/S2,  III/VI SEM Lungs:  Decreased BS at the bases. Abdomen:  Soft, NT, distended, hypertympanic but +BS. Musculoskeletal:  -edema and +tenderness at toes. Skin:  Intact, mild lower extremity venous changes   Labs:  Lab 11/22/11 0515 11/21/11 0605 11/20/11 0730  NA 138 142 139  K 4.2 4.3 4.1  CL 101 102 100  CO2 28 32 29  BUN 18 19 23   CREATININE 0.74 0.84 0.74  GLUCOSE 130* 77 116*    Lab 11/21/11 0605 11/20/11 0730 11/18/11 0500  HGB 11.8* 12.3* 11.5*  HCT 36.9* 38.5* 35.6*  WBC 9.2 8.8 10.5  PLT 395 388 367   Assessment and Plan:  Acute on chronic Respiratory failure, hypercarbic and hypoxemic due to ohs/osa and ? copd - I underscored importance of wearing the CPAP, especially once he is discharged. He has a CPAP and a nasal mask at home and is willing to wear it.  - PFT have been done, I will read them today and consider BD's if indicated - He will follow w Dr Orson Aloe at  [facility] regarding both OSA and COPD  Renal insufficiency  Lab Results  Component Value Date   CREATININE 0.74 11/22/2011   CREATININE 0.84 11/21/2011   CREATININE 0.74 11/20/2011   Aortic Stenosis, severe - Reviewed case w Dr Dicie Beam today, planning to ask Dr Maren Beach to re-eval for possible valve SGY vs managing medically for several months and then considering SGY later once he loses some wt, gets OSA controlled, etc. Valvuloplasty may be last choice her given risk for embolic event.   Gout: -1/22 improved on colchicine   Antonio Pupa, MD, PhD 11/22/2011, 10:08 AM [facility] [phone] or if no answer [phone]

## 2011-11-22 NOTE — Progress Notes (Signed)
SUBJECTIVE: He is feeling much better this am. He is off of supplemental oxygen. No chest pain. Breathing is better. No events.  BP 109/78  Pulse 77  Temp(Src) 98.2 F (36.8 C) (Oral)  Resp 18  Ht 6\' 1"  (1.854 m)  Wt 347 lb 12.7 oz (157.757 kg)  BMI 45.89 kg/m2  SpO2 95%  Intake/Output Summary (Last 24 hours) at 11/22/11 0851 Last data filed at 11/22/11 0500  Gross per 24 hour  Intake    480 ml  Output    850 ml  Net   -370 ml    PHYSICAL EXAM General: Well developed, well nourished, in no acute distress. Alert and oriented x 3.  Psych:  Good affect, responds appropriately Neck: No JVD. No masses noted.  Lungs: Clear bilaterally with no wheezes or rhonci noted.  Heart: RRR with loud systolic murmur noted. Abdomen: Bowel sounds are present. Soft, non-tender.  Extremities: 1+ bilateral lower extremity edema. Chronic venous stasis changes.   LABS: Basic Metabolic Panel:  Basename 11/22/11 0515 11/21/11 0605  NA 138 142  K 4.2 4.3  CL 101 102  CO2 28 32  GLUCOSE 130* 77  BUN 18 19  CREATININE 0.74 0.84  CALCIUM 9.7 9.9  MG -- --  PHOS -- --   CBC:  Basename 11/21/11 0605 11/20/11 0730  WBC 9.2 8.8  NEUTROABS -- --  HGB 11.8* 12.3*  HCT 36.9* 38.5*  MCV 84.1 85.0  PLT 395 388     Current Meds:    . aspirin EC  81 mg Oral Daily  . carvedilol  12.5 mg Oral BID WC  . citalopram  20 mg Oral Daily  . colchicine  0.6 mg Oral BID  . enoxaparin  80 mg Subcutaneous QHS  . exenatide  5 mcg Subcutaneous BID WC  . fenofibrate  160 mg Oral Daily  . furosemide  40 mg Oral BID  . glimepiride  4 mg Oral BID WC  . insulin aspart  0-15 Units Subcutaneous TID WC  . insulin glargine  25 Units Subcutaneous QHS  . potassium chloride  40 mEq Oral Daily  . rosuvastatin  20 mg Oral q1800     ASSESSMENT AND PLAN:  1. Acute on chronic combined systolic and diastolic heart failure: He has diuresed well over the last week (approximately 10 liters) Now on po Lasix and  tolerating. Overall volume status is difficult to assess.  Lisinopril has been stopped.   2. Severe aortic valve stenosis: Echo demonstrates severe AS with peak gradient of and mean gradient of 41 mmHg. His aortic stenosis is likely contributing to his decompensation recently. Dr. Donata Clay has assessed for AVR however given patients deconditioning, current respiratory state with hypercarbia, obesity, OSA, an open surgical procedure is felt to be high risk. The patient has improved considerably over the last few days. We had been planning percutaneous aortic balloon valvuloplasty for next week but will ask Dr. Donata Clay to reassess for possible open surgical procedure. If he is not considered to be a surgical candidate, then we will either proceed with valvuloplasty on Monday 11/26/11 with Dr. Excell Seltzer or manage medically for now and give him a chance to lose weight as an oupt . This will be a bridge procedure to AVR.  Pt was assessed by PT with no f/u recommended. He has been walking the hallways and doing well. He will need to push his exercise for better conditioning, will need weight loss assistance (? Supervised weight loss  program as outpt) and compliance with CPAP.   3. Acute Respiratory Failure/Obesity Hypoventilation syndrome/OSA: He has been compliant with his CPAP and doing well.  Weight loss and compliance with CPAP would help with his CO2 retention.   4. DM: Continue current therapy.    5. Acute renal failure: Likely secondary to over diuresis. Resolved.   6. Gout: On colchicine. Improved.   7. Depression: Celexa 20 mg po Qdaily started 11/20/11.   8. Dispo: Will ask surgical team to reassess for AVR. If not felt to be suitable candidate at this time, will plan percutaneous balloon aortic valvuloplasty on Monday 11/26/11. Will need hospitalization until then. Ambulate in hallways as tolerated.     MCALHANY,CHRISTOPHER  1/24/20138:51 AM

## 2011-11-23 DIAGNOSIS — I359 Nonrheumatic aortic valve disorder, unspecified: Secondary | ICD-10-CM

## 2011-11-23 LAB — BASIC METABOLIC PANEL
Calcium: 9.7 mg/dL (ref 8.4–10.5)
Creatinine, Ser: 0.83 mg/dL (ref 0.50–1.35)
GFR calc non Af Amer: >90 mL/min (ref >90)
Sodium: 140 mEq/L (ref 135–145)

## 2011-11-23 LAB — GLUCOSE, CAPILLARY
Glucose-Capillary: 130 mg/dL — ABNORMAL HIGH (ref 70–99)
Glucose-Capillary: 103 mg/dL — ABNORMAL HIGH (ref 70–99)
Glucose-Capillary: 147 mg/dL — ABNORMAL HIGH (ref 70–99)

## 2011-11-23 NOTE — Progress Notes (Signed)
SUBJECTIVE: He is feeling great. He has no SOB, chest pain. He has been ambulating the hallways.   BP 115/77  Pulse 74  Temp(Src) 97.6 F (36.4 C) (Oral)  Resp 22  Ht 6\' 1"  (1.854 m)  Wt 355 lb 13.2 oz (161.4 kg)  BMI 46.95 kg/m2  SpO2 91%  Intake/Output Summary (Last 24 hours) at 11/23/11 0710 Last data filed at 11/23/11 0156  Gross per 24 hour  Intake    600 ml  Output   1201 ml  Net   -601 ml    PHYSICAL EXAM General: Well developed, well nourished, in no acute distress. Alert and oriented x 3.  Psych:  Good affect, responds appropriately Neck: No JVD. No masses noted.  Lungs: Clear bilaterally with no wheezes or rhonci noted.  Heart: RRR with loud systolic murmur noted. Abdomen: Bowel sounds are present. Soft, non-tender.  Extremities: No lower extremity edema. 1+ bilateral lower ext edema. Chronic venous stasis changes.   LABS: Basic Metabolic Panel:  Basename 11/23/11 0548 11/22/11 0515  NA 140 138  K 4.0 4.2  CL 102 101  CO2 29 28  GLUCOSE 105* 130*  BUN 17 18  CREATININE 0.83 0.74  CALCIUM 9.7 9.7  MG -- --  PHOS -- --   CBC:  Basename 11/21/11 0605 11/20/11 0730  WBC 9.2 8.8  NEUTROABS -- --  HGB 11.8* 12.3*  HCT 36.9* 38.5*  MCV 84.1 85.0  PLT 395 388    Current Meds:    . aspirin EC  81 mg Oral Daily  . carvedilol  12.5 mg Oral BID WC  . citalopram  20 mg Oral Daily  . colchicine  0.6 mg Oral BID  . enoxaparin  80 mg Subcutaneous QHS  . exenatide  5 mcg Subcutaneous BID WC  . fenofibrate  160 mg Oral Daily  . furosemide  40 mg Oral BID  . glimepiride  4 mg Oral BID WC  . insulin aspart  0-15 Units Subcutaneous TID WC  . insulin glargine  25 Units Subcutaneous QHS  . potassium chloride  40 mEq Oral Daily  . rosuvastatin  20 mg Oral q1800     ASSESSMENT AND PLAN:  1. Acute on chronic combined systolic and diastolic heart failure: His heart failure has resolved. He has diuresed well.  Now on po Lasix and tolerating. Lisinopril has  been stopped during this hospitalization secondary to renal insufficiency in setting of severe AS.    2. Severe aortic valve stenosis: Echo demonstrates severe AS with peak gradient of and mean gradient of 41 mmHg. His aortic stenosis likely contributed to his decompensation recently. Dr. Donata Clay has assessed for AVR however given patients deconditioning, current respiratory state with hypercarbia, obesity, OSA, an open surgical procedure was felt to be high risk. The patient has improved considerably over the last few days. We had been planning percutaneous aortic balloon valvuloplasty for next week but we have  asked Dr. Donata Clay to reassess the patient again for possible open surgical procedure. If he is not considered to be a surgical candidate, then we will either proceed with valvuloplasty on Monday 11/26/11 with Dr. Excell Seltzer or manage medically for now and give him a chance to lose weight as an oupt . The patient understands that a balloon valvuloplasty would be a bridge procedure to AVR which also carries risks. Pt was assessed by PT with no f/u recommended. He has been walking the hallways and doing well. He will need to  push his exercise for better conditioning, will need weight loss assistance (? Supervised weight loss program as outpt) and compliance with CPAP.   3. Acute Respiratory Failure/Obesity Hypoventilation syndrome/OSA: He has been compliant with his CPAP and doing well. Weight loss and compliance with CPAP would help with his CO2 retention.   4. DM: Continue current therapy.   5. Acute renal failure: Likely secondary to over diuresis. Resolved.   6. Gout: On colchicine. Improved.   7. Depression: Celexa 20 mg po Qdaily started 11/20/11.   8. Dispo: Will ask surgical team to reassess for AVR. If not felt to be suitable candidate at this time, will either plan percutaneous balloon aortic valvuloplasty on Monday 11/26/11 or if he refuses the BAV, will d/c home on Monday and give  him a chance to lose weight and improve his overall ventilatory state before traditional AVR. Ambulate in hallways as tolerated.    Eesha Schmaltz  1/25/20137:10 AM

## 2011-11-23 NOTE — Progress Notes (Signed)
Name: Antonio [name]. MRN: [id number] DOB: 11/01/1950    LOS: 10  PCCM Progress NOTE  Brief patient profile: 60y/o wm, morbid obesity and OSA as well as extensive cardiac history including severe AS, HTN and CHF.  Patient was admitted from cardiology's office with the chief complaint of SOB on 1/15  Had a sleep study that revealed severe OSA but  non-compliant with CPAP up to time of admission with pulmonary artery pressure is normal with normal RV function by echo 1/15 - Pulmonary asked to see 1/18 for resp failure.  Micro: MRSA screen 1/15 > neg  Antibiotics: None  Tests / Events: PFT 11/21/11 >>  Spirometry: FVC 2.32 (45%), FEV1 1.77 (46%), ratio 76%; Consistent w mixed obstruction and restriction, no response to BD Volumes: Hyperinflated based on normal TLC and high RV DLCO: decreased but normalizes when adjusted for alveolar volume.   Overnight Has done very well with CPAP compliance for the last 3 nights   Intake/Output Summary (Last 24 hours) at 11/23/11 1146 Last data filed at 11/23/11 0919  Gross per 24 hour  Intake    840 ml  Output   1951 ml  Net  -1111 ml   Physical Examination: General:  Morbidly obese alert and approp HEENT:  [place]/AT, PERRL, EOM-I and dry MM. Neck:  Supple, -LAN and -thyromegally.   Cardiovascular:  RRR, Nl S1/S2,  III/VI SEM Lungs:  Decreased BS at the bases. Abdomen:  Soft, NT, distended, hypertympanic but +BS. Musculoskeletal:  -edema and +tenderness at toes. Skin:  Intact, mild lower extremity venous changes   Labs:  Lab 11/23/11 0548 11/22/11 0515 11/21/11 0605  NA 140 138 142  K 4.0 4.2 4.3  CL 102 101 102  CO2 29 28 32  BUN 17 18 19   CREATININE 0.83 0.74 0.84  GLUCOSE 105* 130* 77    Lab 11/21/11 0605 11/20/11 0730 11/18/11 0500  HGB 11.8* 12.3* 11.5*  HCT 36.9* 38.5* 35.6*  WBC 9.2 8.8 10.5  PLT 395 388 367   Assessment and Plan:  Acute on chronic Respiratory failure, hypercarbic and hypoxemic due to ohs/osa and ?  copd - I underscored importance of wearing the CPAP, especially once he is discharged. He has a CPAP and a nasal mask at home and is willing to wear it.  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 Not clear that he would benefit significantly from inhaled therapy at this time. Will defer but reconsider if he clinically changes.  - He will follow w Dr Orson Al[facility] at [facility] regarding both OSA and COPD  Renal insufficiency  Lab Results  Component Value Date   CREATININE 0.83 11/23/2011   CREATININE 0.74 11/22/2011   CREATININE 0.84 11/21/2011   Aortic Stenosis, severe - Reviewed case w Dr Dicie Beam today, planning to ask Dr Maren Beach to re-eval for possible valve SGY vs managing medically for several months and then considering SGY later once he loses some wt, gets OSA controlled, etc. Valvuloplasty may be last choice her given risk for embolic event.   Gout: -1/22 improved on colchicine  I will sign off. Please call if I can help in any way.   Levy Pupa, MD, PhD 11/23/2011, 11:46 AM [facility] [phone] or if no answer [phone]

## 2011-11-23 NOTE — Progress Notes (Signed)
Severe Aortic stenosis peak gradient   LVEF .60 [place] obesity BMI > 50 DM OSA on CPAP Recent gout attack now resolving ,ambulating in hall  EXAM  NSR afebrile High pitched SEM  LE edema improved I agree patient should now have conventional AVR -- He wishes to go home to lose more weight so will see back in office in 2 wks to assess and set date for AVR.

## 2011-11-24 LAB — GLUCOSE, CAPILLARY
Glucose-Capillary: 147 mg/dL — ABNORMAL HIGH (ref 70–99)
Glucose-Capillary: 92 mg/dL (ref 70–99)

## 2011-11-24 LAB — BASIC METABOLIC PANEL
BUN: 17 mg/dL (ref 6–23)
CO2: 26 mEq/L (ref 19–32)
Chloride: 102 mEq/L (ref 96–112)
GFR calc Af Amer: >90 mL/min (ref >90)
Glucose, Bld: 93 mg/dL (ref 70–99)
Potassium: 4 mEq/L (ref 3.5–5.1)

## 2011-11-24 NOTE — Progress Notes (Signed)
Patient ID: Antonio [name]., male   DOB: 09-19-1951, 61 y.o.   MRN: [id number]    SUBJECTIVE: He is feeling great. He has no SOB, chest pain. He has been ambulating the hallways.   BP 128/82  Pulse 81  Temp(Src) 97.5 F (36.4 C) (Oral)  Resp 24  Ht 6\' 1"  (1.854 m)  Wt 160.8 kg (354 lb 8 oz)  BMI 46.77 kg/m2  SpO2 91%  Intake/Output Summary (Last 24 hours) at 11/24/11 1414 Last data filed at 11/24/11 1256  Gross per 24 hour  Intake   1520 ml  Output   2025 ml  Net   -505 ml    PHYSICAL EXAM General: Well developed, well nourished, in no acute distress. Alert and oriented x 3.  Psych:  Good affect, responds appropriately Neck: No JVD. No masses noted.  Lungs: Clear bilaterally with no wheezes or rhonci noted.  Heart: RRR with 3/6 AS-type murmur Abdomen: Bowel sounds are present. Soft, non-tender.  Extremities: No lower extremity edema. 1+ bilateral lower ext edema. Chronic venous stasis changes.   LABS: Basic Metabolic Panel:  Basename 11/24/11 0500 11/23/11 0548  NA 141 140  K 4.0 4.0  CL 102 102  CO2 26 29  GLUCOSE 93 105*  BUN 17 17  CREATININE 0.74 0.83  CALCIUM 9.5 9.7  MG -- --  PHOS -- --   CBC: No results found for this basename: WBC:2,NEUTROABS:2,HGB:2,HCT:2,MCV:2,PLT:2 in the last 72 hours  Current Meds:    . aspirin EC  81 mg Oral Daily  . carvedilol  12.5 mg Oral BID WC  . citalopram  20 mg Oral Daily  . colchicine  0.6 mg Oral BID  . enoxaparin  80 mg Subcutaneous QHS  . exenatide  5 mcg Subcutaneous BID WC  . fenofibrate  160 mg Oral Daily  . furosemide  40 mg Oral BID  . glimepiride  4 mg Oral BID WC  . insulin aspart  0-15 Units Subcutaneous TID WC  . insulin glargine  25 Units Subcutaneous QHS  . potassium chloride  40 mEq Oral Daily  . rosuvastatin  20 mg Oral q1800     ASSESSMENT AND PLAN:  1. Acute on chronic diastolic heart failure: EF 55-60%.  His heart failure has resolved. He has diuresed well.  Now on po Lasix and  tolerating. Lisinopril has been stopped during this hospitalization secondary to renal insufficiency in setting of severe AS.    2. Severe aortic valve stenosis: Echo demonstrates severe AS with peak gradient of and mean gradient of 41 mmHg. He has been seen again by Dr. Donata [name].  Plan now is surgical AVR.  He wants to go home and work on weight loss initially, will followup in office with Dr. Donata [name] in 2 wks (see note from yesterday).    3. Obesity Hypoventilation syndrome/OSA: He has been compliant with his CPAP and doing well. Weight loss and compliance with CPAP would help with his CO2 retention.   4. DM: Continue current therapy.   5. Acute renal failure: Likely secondary to over diuresis. Resolved.   6. Gout: On colchicine.Continue.  7. Depression: Celexa 20 mg po Qdaily started 11/20/11.   8. Dispo: Plan discharge tomorrow or Monday.  Will have nutrition and social work see him (wants to start working on disability).  He will followup with Dr. Donata [name] in 2 wks after discharge.     Antonio [name]  1/26/20132:14 PM

## 2011-11-25 LAB — GLUCOSE, CAPILLARY
Glucose-Capillary: 101 mg/dL — ABNORMAL HIGH (ref 70–99)
Glucose-Capillary: 125 mg/dL — ABNORMAL HIGH (ref 70–99)

## 2011-11-25 LAB — BASIC METABOLIC PANEL
Calcium: 9.7 mg/dL (ref 8.4–10.5)
Creatinine, Ser: 0.86 mg/dL (ref 0.50–1.35)
Glucose, Bld: 101 mg/dL — ABNORMAL HIGH (ref 70–99)
Potassium: 4.1 mEq/L (ref 3.5–5.1)

## 2011-11-25 NOTE — Progress Notes (Signed)
Pt. Has his home CPAP with nasal mask set up at bedside. Pt. Stated that he would put CPAP on when ready for bed.

## 2011-11-25 NOTE — Progress Notes (Signed)
Pt. Has his home CPAP set up at bedside with nasal mask. Pt. Stated that he would put CPAP on before going to bed.

## 2011-11-25 NOTE — Progress Notes (Signed)
Patient ID: Antonio [name]., male   DOB: 1950-12-15, 61 y.o.   MRN: [id number] SUBJECTIVE:Patient is feeling well. His overall status is stabilizing. He will be ready to go home tomorrow after ambulating today and after he is seen by social work. He wants to be sure that all arrangements are in place before he leaves.   Filed Vitals:   11/24/11 1449 11/24/11 1704 11/24/11 2100 11/25/11 0459  BP: 110/63 144/86 110/76 127/77  Pulse: 73 99 71 80  Temp: 97 F (36.1 C)  98.5 F (36.9 C) 97.8 F (36.6 C)  TempSrc:   Oral Oral  Resp: 20  20 24   Height:      Weight:    354 lb 4.5 oz (160.7 kg)  SpO2: 92%  96% 93%    Intake/Output Summary (Last 24 hours) at 11/25/11 0937 Last data filed at 11/25/11 0845  Gross per 24 hour  Intake   1250 ml  Output   1025 ml  Net    225 ml    LABS: Basic Metabolic Panel:  Basename 11/25/11 0510 11/24/11 0500  NA 137 141  K 4.1 4.0  CL 101 102  CO2 29 26  GLUCOSE 101* 93  BUN 19 17  CREATININE 0.86 0.74  CALCIUM 9.7 9.5  MG -- --  PHOS -- --   Liver Function Tests: No results found for this basename: AST:2,ALT:2,ALKPHOS:2,BILITOT:2,PROT:2,ALBUMIN:2 in the last 72 hours No results found for this basename: LIPASE:2,AMYLASE:2 in the last 72 hours CBC: No results found for this basename: WBC:2,NEUTROABS:2,HGB:2,HCT:2,MCV:2,PLT:2 in the last 72 hours Cardiac Enzymes: No results found for this basename: CKTOTAL:3,CKMB:3,CKMBINDEX:3,TROPONINI:3 in the last 72 hours BNP: No components found with this basename: POCBNP:3 D-Dimer: No results found for this basename: DDIMER:2 in the last 72 hours Hemoglobin A1C: No results found for this basename: HGBA1C in the last 72 hours Fasting Lipid Panel: No results found for this basename: CHOL,HDL,LDLCALC,TRIG,CHOLHDL,LDLDIRECT in the last 72 hours Thyroid Function Tests: No results found for this basename: TSH,T4TOTAL,FREET3,T3FREE,THYROIDAB in the last 72 hours  RADIOLOGY: Dg  Orthopantogram  11/17/2011  *RADIOLOGY REPORT*  Clinical Data: Oral evaluation prior to anticipated valve surgery.  PANOREX ONE-VIEW  Comparison: None.  Findings: Study is motion limited.  The patient was unable to stand for the examination.  There is prominent midline artifact, limiting evaluation of the central and lateral incisors.  Some of the molars are absent.  No significant periodontal disease is identified. There is no evidence of acute fracture.  The temporal mandibular joints are not imaged.  IMPRESSION: Limited study shows no evidence of significant periodontal disease. Midline evaluation is not possible.  Original Report Authenticated By: Gerrianne Scale, M.D.   Dg Chest 1 View  11/16/2011  *RADIOLOGY REPORT*  Clinical Data: Follow up opacity in the right lung  CHEST - 1 VIEW  Comparison: Chest x-ray of 11/15/2011  Findings: The lungs are not as well aerated.  The vague opacity noted previously the right upper lung field is not seen and may represent resolving pneumonia or edema. There does appear to be minimal pulmonary vascular congestion present.  Cardiomegaly is stable.  IMPRESSION: Stable cardiomegaly.  No definite infiltrate or effusion. Probable minimal pulmonary vascular congestion.  Original Report Authenticated By: Juline Patch, M.D.   Dg Chest 2 View  11/15/2011  *RADIOLOGY REPORT*  Clinical Data: CHF and elevated white blood cell count  CHEST - 2 VIEW  Comparison: 04/01/2011  Findings: There is moderate cardiac enlargement.  Atelectasis is noted  within the left base.  Unchanged from previous exam.  Interstitial edema pattern is noted with worsening aeration to the right upper lobe.  IMPRESSION:  1.  Slight increased opacification of the right upper lobe. 2.  CHF.  Original Report Authenticated By: Rosealee Albee, M.D.    PHYSICAL EXAM Patient is oriented to person time and place. Affect is normal. There is no jugular venous distention. Lungs are clear. Respiratory effort is  not labored. There is a 3/6 crescendo decrescendo high-pitched systolic murmur compatible with his aortic stenosis. Abdomen is soft. There is trace peripheral edema.   TELEMETRY: I personally reviewed telemetry. There is normal sinus rhythm.   ASSESSMENT AND PLAN:  Principal Problem:   *Systolic and diastolic CHF, acute on chronic  This issues is now stable. No change in therapy.  Active Problems:  DIABETES MELLITUS, TYPE II, UNCONTROLLED   Gout, unspecified  He's having very mild symptoms. No changes in his meds can be made at this time.   OBESITY, NOS   AORTIC STENOSIS  The plan for him is discharged tomorrow with followup with Dr. Donata Clay for aortic valve replacement in several weeks.   EDEMA-LEGS,DUE TO VENOUS OBSTRUCT.  Acute on chronic systolic congestive heart failure   [name] 11/25/2011 9:37 AM

## 2011-11-26 ENCOUNTER — Telehealth: Payer: Self-pay | Admitting: Cardiovascular Disease

## 2011-11-26 DIAGNOSIS — I359 Nonrheumatic aortic valve disorder, unspecified: Secondary | ICD-10-CM

## 2011-11-26 LAB — URINALYSIS, ROUTINE W REFLEX MICROSCOPIC
Bilirubin Urine: NEGATIVE
Glucose, UA: NEGATIVE mg/dL
Ketones, ur: NEGATIVE mg/dL
Leukocytes, UA: NEGATIVE
Nitrite: NEGATIVE
Protein, ur: NEGATIVE mg/dL
Specific Gravity, Urine: 1.022 (ref 1.005–1.030)
Urobilinogen, UA: 1 mg/dL (ref 0.0–1.0)
pH: 6 (ref 5.0–8.0)

## 2011-11-26 LAB — GLUCOSE, CAPILLARY
Glucose-Capillary: 61 mg/dL — ABNORMAL LOW (ref 70–99)
Glucose-Capillary: 67 mg/dL — ABNORMAL LOW (ref 70–99)
Glucose-Capillary: 102 mg/dL — ABNORMAL HIGH (ref 70–99)
Glucose-Capillary: 91 mg/dL (ref 70–99)

## 2011-11-26 LAB — SURGICAL PCR SCREEN
MRSA, PCR: NEGATIVE
Staphylococcus aureus: NEGATIVE

## 2011-11-26 LAB — URINE MICROSCOPIC-ADD ON

## 2011-11-26 LAB — TSH: TSH: 2.164 u[IU]/mL (ref 0.350–4.500)

## 2011-11-26 MED ORDER — CHLORHEXIDINE GLUCONATE 4 % EX LIQD
60.0000 mL | Freq: Once | CUTANEOUS | Status: DC
  Filled 2011-11-26: qty 60

## 2011-11-26 MED ORDER — TEMAZEPAM 15 MG PO CAPS: 15.0000 mg | ORAL_CAPSULE | Freq: Once | ORAL | Status: DC | PRN

## 2011-11-26 MED ORDER — DEXTROSE 5 % IV SOLN
750.0000 mg | INTRAVENOUS | Status: DC
  Filled 2011-11-26: qty 750

## 2011-11-26 MED ORDER — BISACODYL 5 MG PO TBEC: 5.0000 mg | DELAYED_RELEASE_TABLET | Freq: Once | ORAL | Status: DC

## 2011-11-26 MED ORDER — METOPROLOL TARTRATE 12.5 MG HALF TABLET
12.5000 mg | ORAL_TABLET | Freq: Once | ORAL | Status: DC
  Filled 2011-11-26: qty 1

## 2011-11-26 MED ORDER — INSULIN GLARGINE 100 UNIT/ML ~~LOC~~ SOLN
15.0000 [IU] | Freq: Every day | SUBCUTANEOUS | Status: DC
  Administered 2011-11-26 – 2011-11-28 (×3): 15 [IU] via SUBCUTANEOUS

## 2011-11-26 NOTE — Progress Notes (Signed)
Call To [facility] Cardiology about lower blood sugars. Orders received to decrease amt of nightly Lantus to 15 U SQ. Marisa Cyphers RN

## 2011-11-26 NOTE — Telephone Encounter (Signed)
Patient would like to talk with Dr. Clifton James about the heart surgery he recommended  the past Friday. Patient has talked with his family and is reconsidering to have it done this week. Trish trent  aware to let the  MD know.

## 2011-11-26 NOTE — Progress Notes (Signed)
Patient with improved clinical status with respect to COPD-hypercarbia, gout, creatinine, and ability to ambulate,  Will plan AVR Thurs am 1-31.  Patient had cor arteriograms a year ago w/o sig disease. Do not rec another LHC due to renal dz but REC RIGHT HEART CATH BEFORE AVR TO ASSESS RISK AND PREPARE PERIOP CARE   ie Nitric oxide.

## 2011-11-26 NOTE — Telephone Encounter (Signed)
New Msg: Pt calling wanting to speak with MD. Pt is at [facility], Room 4712. Pt said he wanted to continue conversation from Friday. Please return pt call to discuss further.

## 2011-11-26 NOTE — Progress Notes (Signed)
Recheck CBG 67. Pt given graham cracker & peanut butter Asympoamatic . Lunch tray here encouraged to start eating immediately.   Marisa Cyphers RN

## 2011-11-26 NOTE — Progress Notes (Addendum)
CBG 102 Pt finished eating 100 % Lunch . Asymptomatic. Marisa Cyphers RN

## 2011-11-26 NOTE — Progress Notes (Signed)
CBG: 67  Treatment: 15 GM carbohydrate snack  Symptoms: None  Follow-up CBG: Time:1240 CBG Result:102  Possible Reasons for Event: Medication regimen: Pt keeping to strict carb diet - no snacking   Comments/MD notified:Followed protocol wrote progress note    Antonio [name]

## 2011-11-26 NOTE — Telephone Encounter (Signed)
I will see him today.  He is on my service. cdm

## 2011-11-26 NOTE — Progress Notes (Signed)
CSW met with patient at bedside to offer support and also discuss medicaid and SSI. Pt. Reported having interest in applying for SSI due to his medical condition. CSW provided patient with a Medicaid/SSI packet and explained the process of applying for Disability. Pt reported that he spoke with his MD and decided to have surgery. Pt said he is mentally in a good place to have the surgery.  CSW reviewed all CHF management guidelines with the patient and stressed the importance of following all guidelines. Pt has a good support system and lives with his wife. Pt also has support from his ex wife and his 2 daughters. Pt. Reported no symptoms of depression at this time. Pt also asked for an AD packet. CSW provided patient with a packet and reviewed the packet with the patient and his daughter. CSW will return tomorrow to answer any questions the patient has. CSW will continue to follow.

## 2011-11-26 NOTE — Progress Notes (Signed)
SUBJECTIVE: NO complaints. Breathing is much better. NO chest pain. No events.   BP 125/79  Pulse 74  Temp(Src) 98.4 F (36.9 C) (Oral)  Resp 22  Ht 6\' 1"  (1.854 m)  Wt 350 lb 15.6 oz (159.2 kg)  BMI 46.31 kg/m2  SpO2 94%  Intake/Output Summary (Last 24 hours) at 11/26/11 1016 Last data filed at 11/26/11 0915  Gross per 24 hour  Intake   1060 ml  Output   1805 ml  Net   -745 ml    PHYSICAL EXAM General: Well developed, well nourished, in no acute distress. Alert and oriented x 3.  Psych:  Good affect, responds appropriately Neck: No JVD. No masses noted.  Lungs: Clear bilaterally with no wheezes or rhonci noted.  Heart: RRR with no murmurs noted. Abdomen: Bowel sounds are present. Soft, non-tender.  Extremities: No lower extremity edema.   LABS: Basic Metabolic Panel:  Basename 11/25/11 0510 11/24/11 0500  NA 137 141  K 4.1 4.0  CL 101 102  CO2 29 26  GLUCOSE 101* 93  BUN 19 17  CREATININE 0.86 0.74  CALCIUM 9.7 9.5  MG -- --  PHOS -- --    Current Meds:    . aspirin EC  81 mg Oral Daily  . carvedilol  12.5 mg Oral BID WC  . citalopram  20 mg Oral Daily  . colchicine  0.6 mg Oral BID  . enoxaparin  80 mg Subcutaneous QHS  . exenatide  5 mcg Subcutaneous BID WC  . fenofibrate  160 mg Oral Daily  . furosemide  40 mg Oral BID  . glimepiride  4 mg Oral BID WC  . insulin aspart  0-15 Units Subcutaneous TID WC  . insulin glargine  25 Units Subcutaneous QHS  . potassium chloride  40 mEq Oral Daily  . rosuvastatin  20 mg Oral q1800     ASSESSMENT AND PLAN:  1. Acute on chronic combined systolic and diastolic heart failure: His heart failure has resolved. Now on po Lasix and tolerating. Lisinopril has been stopped during this hospitalization secondary to renal insufficiency in setting of severe AS.   2. Severe aortic valve stenosis: Echo demonstrates severe AS with peak gradient of and mean gradient of 41 mmHg. His aortic stenosis likely contributed  to his decompensation recently. Dr. Donata Clay has assessed for AVR and feels that we can now consider a traditional AVR. The patient had refused AVR this week but has reconsidered after speaking with family and would like to have the AVR this week if possible.   He will need to push his exercise for better conditioning, will need weight loss assistance (? Supervised weight loss program as outpt) and compliance with CPAP.   3. Acute Respiratory Failure/Obesity Hypoventilation syndrome/OSA: He has been compliant with his CPAP and doing well. Weight loss and compliance with CPAP would help with his CO2 retention.   4. DM: Continue current therapy.   5. Acute renal failure: Likely secondary to over diuresis. Resolved.   6. Gout:  Improved.   7. Depression: Celexa 20 mg po Qdaily started 11/20/11. Mood much better.   8. Dispo: Pt has decided that he would like to consider AVR this week if Dr. Donata Clay can work him in. Will discuss with Dr. Donata Clay.     Maraya Gwilliam  1/28/201310:16 AM

## 2011-11-26 NOTE — Progress Notes (Signed)
CARE MANAGEMENT NOTE HEART FAILURE  11/26/2011   Patient:  Antonio [name], Antonio [name]   Account Number:  [id number]    Date Initiated:  11/16/2011  Documentation initiated by:  Tera Mater  Subjective/Objective Assessment:   60yo male direct admit from cardiology office with SOB, LE edema, orthopnea.  Pt. lives with spouse at home.  HX: CHF, DM, HTN, AS.   Action/Plan:   Spoke with pt. and wife at length about HF book and CPAP use. Pt. has sleep apnea and has a CPAP machine at home, however he refuses to wear it.  Encouraged pt. to use CPAP machine while in hospital.  May benefit from [facility] RN for HF manage.   Anticipated DC Date:  11/19/2011  Anticipated DC Plan:  HOME W HOME HEALTH SERVICES  DC Planning Services:  CM consult    Choice offered to / List presented to:          Status of service:  In process, will continue to follow  Medicare Important Message Given:  NO (If response is "NO", the following Medicare IM given date fields will be blank) Date Medicare IM Given:   Date Additional Medicare IM Given:    Discharge Disposition:    Per UR Regulation:    Comments:   11/26/11 1230 Pt. diuresed very well past few days and is ambulating in hall.  Dr. Donata Clay has determined that pt. may be eligible to have conventional AV repair due to weight loss.  Pt. wants to have the surgery while he is inpatient this admission.  In addition, Amy Stuckey, HF CSW has spoken to pt. about disability and assisted pt. and family in making living will.  NCM to continue to follow. Tera Mater, RN, BSN [phone]   11/20/11 1100 Spoke with pt. at length about using CPAP machine while sleeping.  Pt. has not used machine in 2 nights.  In addition, [facility] has consulted on pt. for possible BAV.  Pt. states he may have the surgery this week.  Looks better and is able to stay awake during a conversation.  NCM will continue to follow. Tera Mater, RN, BSN   11/19/11 1530 UR Completed. Tera Mater,  RN, BSN   Initial CM contact:  11/16/2011 03:00 PM  By:  Tera Mater Initial CSW contact:  11/26/2011 12:30 PM  By:  Sabino Niemann    Is this an INP Readmission < 30 days:  N (If "YES" please see readmission information at the bottom of note)  Patient living status prior to this admission:  FAMILY  Patient setting prior to this admission:  HOME  Comorbid conditions being treated that contributed to this admission:  CHF, AS, DM, HTN  CHF Readmission Risk:  high  Type of patient education provided  HF Patient Education Assessment / Teach Back  HF Zone Tool / Magnet  Weigh daily  Limit salt intake     Patient education provided by  [facility]    Was referral made to [facility]:  N  Is the patient's PCP the same as attending:  N PCP:    Readmission < 30 Days If pt has HH, did they contact the agency before going to the ED:   Name of [facility] agency:    Was the follow-up physician visit scheduled prior to discharge:    Did the patient follow-up with the physician prior to this readmission:    Was there HF Clinic visits prior to readmission:    Were there ED visits between admissions:  Readmit type:    If unscheduled and related indicate reason for readmit:

## 2011-11-26 NOTE — Progress Notes (Signed)
Pt CBG 60 Given 4 OZ of Orange Juice Alert & oriented asymptomatic. Marisa Cyphers RN

## 2011-11-26 NOTE — Progress Notes (Signed)
RT Note: Pt placed himself on CPAP. RT will monitor.

## 2011-11-26 NOTE — Progress Notes (Signed)
Pt given Cardiac Surgery book to read and discuss pending OR for valve replacement. Marisa Cyphers RN

## 2011-11-26 NOTE — Progress Notes (Signed)
CBG: 60  Treatment: 15 GM carbohydrate snack  Symptoms: None  Follow-up CBG: Time: 1215  CBG Result:67  Possible Reasons for Event: Other: Pt following strict carb modified diet - no snacking  Comments/MD notified:Followed protocol    Marissa Nestle

## 2011-11-27 DIAGNOSIS — Z01810 Encounter for preprocedural cardiovascular examination: Secondary | ICD-10-CM

## 2011-11-27 LAB — COMPREHENSIVE METABOLIC PANEL
ALT: 24 U/L (ref 0–53)
AST: 18 U/L (ref 0–37)
Albumin: 3 g/dL — ABNORMAL LOW (ref 3.5–5.2)
Alkaline Phosphatase: 69 U/L (ref 39–117)
BUN: 19 mg/dL (ref 6–23)
CO2: 27 mEq/L (ref 19–32)
Calcium: 9.5 mg/dL (ref 8.4–10.5)
Chloride: 103 mEq/L (ref 96–112)
Creatinine, Ser: 0.82 mg/dL (ref 0.50–1.35)
GFR calc Af Amer: >90 mL/min (ref >90)
GFR calc non Af Amer: >90 mL/min (ref >90)
Glucose, Bld: 88 mg/dL (ref 70–99)
Potassium: 3.8 mEq/L (ref 3.5–5.1)
Sodium: 141 mEq/L (ref 135–145)
Total Bilirubin: 0.2 mg/dL — ABNORMAL LOW (ref 0.3–1.2)
Total Protein: 7 g/dL (ref 6.0–8.3)

## 2011-11-27 LAB — LIPID PANEL
Cholesterol: 112 mg/dL (ref 0–200)
HDL: 26 mg/dL — ABNORMAL LOW (ref >39)
LDL Cholesterol: 54 mg/dL (ref 0–99)
Total CHOL/HDL Ratio: 4.3 RATIO
Triglycerides: 161 mg/dL — ABNORMAL HIGH (ref <150)
VLDL: 32 mg/dL (ref 0–40)

## 2011-11-27 LAB — BLOOD GAS, ARTERIAL
Acid-Base Excess: 3.1 mmol/L — ABNORMAL HIGH (ref 0.0–2.0)
Bicarbonate: 27.2 mEq/L — ABNORMAL HIGH (ref 20.0–24.0)
Drawn by: 352351
FIO2: 0.21 %
O2 Saturation: 94.2 %
Patient temperature: 98.6
TCO2: 28.4 mmol/L (ref 0–100)
pCO2 arterial: 41.9 mmHg (ref 35.0–45.0)
pH, Arterial: 7.427 (ref 7.350–7.450)
pO2, Arterial: 69 mmHg — ABNORMAL LOW (ref 80.0–100.0)

## 2011-11-27 LAB — APTT: aPTT: 36 seconds (ref 24–37)

## 2011-11-27 LAB — CBC
HCT: 39.1 % (ref 39.0–52.0)
Hemoglobin: 12.7 g/dL — ABNORMAL LOW (ref 13.0–17.0)
MCH: 27 pg (ref 26.0–34.0)
MCHC: 32.5 g/dL (ref 30.0–36.0)
MCV: 83.2 fL (ref 78.0–100.0)
Platelets: 364 10*3/uL (ref 150–400)
RBC: 4.7 MIL/uL (ref 4.22–5.81)
RDW: 13.9 % (ref 11.5–15.5)
WBC: 8.9 10*3/uL (ref 4.0–10.5)

## 2011-11-27 LAB — GLUCOSE, CAPILLARY
Glucose-Capillary: 89 mg/dL (ref 70–99)
Glucose-Capillary: 159 mg/dL — ABNORMAL HIGH (ref 70–99)

## 2011-11-27 LAB — PROTIME-INR
INR: 1.4 (ref 0.00–1.49)
Prothrombin Time: 17.4 seconds — ABNORMAL HIGH (ref 11.6–15.2)

## 2011-11-27 MED ORDER — SODIUM CHLORIDE 0.9 % IJ SOLN: 3.0000 mL | INTRAMUSCULAR | Status: DC | PRN

## 2011-11-27 MED ORDER — SODIUM CHLORIDE 0.9 % IJ SOLN
3.0000 mL | Freq: Two times a day (BID) | INTRAMUSCULAR | Status: DC
  Administered 2011-11-27 – 2011-11-28 (×2): 3 mL via INTRAVENOUS

## 2011-11-27 MED ORDER — SODIUM CHLORIDE 0.9 % IV SOLN: 250.0000 mL | INTRAVENOUS | Status: DC | PRN

## 2011-11-27 MED ORDER — DEXTROSE 5 % IV SOLN
1.5000 g | INTRAVENOUS | Status: DC
  Filled 2011-11-27: qty 1.5

## 2011-11-27 MED ORDER — DIAZEPAM 5 MG PO TABS
5.0000 mg | ORAL_TABLET | ORAL | Status: DC
  Filled 2011-11-27: qty 1

## 2011-11-27 MED ORDER — SODIUM CHLORIDE 0.9 % IV SOLN
1500.0000 mg | INTRAVENOUS | Status: DC
  Filled 2011-11-27: qty 1500

## 2011-11-27 NOTE — Progress Notes (Addendum)
Pre-op Cardiac Surgery  Carotid Findings:  No significant ICA stenosis with antegrade vertebral flow bilaterally.  Upper Extremity Right Left  Brachial Pressures 135 122  Radial Waveforms Tri Tri  Ulnar Waveforms Tri Tri  Palmar Arch (Allen's Test) Waveform remains normal with radial and ulnar compression Waveform remains normal with radial compression and decreases >50% with ulnar compression   Farrel Demark, RDMS

## 2011-11-27 NOTE — Progress Notes (Signed)
SUBJECTIVE: No complaints this am. No chest pain or SOB.   BP 108/73  Pulse 77  Temp(Src) 97.6 F (36.4 C) (Oral)  Resp 24  Ht 6\' 1"  (1.854 m)  Wt 354 lb 8 oz (160.8 kg)  BMI 46.77 kg/m2  SpO2 94%  Intake/Output Summary (Last 24 hours) at 11/27/11 0814 Last data filed at 11/27/11 0756  Gross per 24 hour  Intake    840 ml  Output   1530 ml  Net   -690 ml    PHYSICAL EXAM General: Well developed, well nourished, in no acute distress. Alert and oriented x 3.  Psych:  Good affect, responds appropriately Neck: No JVD. No masses noted.  Lungs: Clear bilaterally with no wheezes or rhonci noted.  Heart: RRR with loud systolic murmur noted. Abdomen: Bowel sounds are present. Soft, non-tender.  Extremities: No lower extremity edema.   LABS: Basic Metabolic Panel:  Basename 11/27/11 0520 11/25/11 0510  NA 141 137  K 3.8 4.1  CL 103 101  CO2 27 29  GLUCOSE 88 101*  BUN 19 19  CREATININE 0.82 0.86  CALCIUM 9.5 9.7  MG -- --  PHOS -- --   CBC:  Basename 11/27/11 0520  WBC 8.9  NEUTROABS --  HGB 12.7*  HCT 39.1  MCV 83.2  PLT 364   Fasting Lipid Panel:  Basename 11/27/11 0520  CHOL 112  HDL 26*  LDLCALC 54  TRIG 161*  CHOLHDL 4.3  LDLDIRECT --    Current Meds:    . aspirin EC  81 mg Oral Daily  . bisacodyl  5 mg Oral Once  . carvedilol  12.5 mg Oral BID WC  . cefUROXime (ZINACEF)  IV  750 mg Intravenous To OR  . chlorhexidine  60 mL Topical Once  . chlorhexidine  60 mL Topical Once  . chlorhexidine  60 mL Topical Once  . citalopram  20 mg Oral Daily  . colchicine  0.6 mg Oral BID  . enoxaparin  80 mg Subcutaneous QHS  . exenatide  5 mcg Subcutaneous BID WC  . fenofibrate  160 mg Oral Daily  . furosemide  40 mg Oral BID  . glimepiride  4 mg Oral BID WC  . insulin aspart  0-15 Units Subcutaneous TID WC  . insulin glargine  15 Units Subcutaneous QHS  . metoprolol tartrate  12.5 mg Oral Once  . potassium chloride  40 mEq Oral Daily  . rosuvastatin   20 mg Oral q1800  . DISCONTD: bisacodyl  5 mg Oral Once  . DISCONTD: cefUROXime (ZINACEF)  IV  750 mg Intravenous To OR  . DISCONTD: chlorhexidine  60 mL Topical Once  . DISCONTD: chlorhexidine  60 mL Topical Once  . DISCONTD: chlorhexidine  60 mL Topical Once  . DISCONTD: chlorhexidine  60 mL Topical Once  . DISCONTD: insulin glargine  25 Units Subcutaneous QHS  . DISCONTD: metoprolol tartrate  12.5 mg Oral Once     ASSESSMENT AND PLAN:  1. Acute on chronic combined systolic and diastolic heart failure: His heart failure has resolved. Now on po Lasix and tolerating. Lisinopril has been stopped during this hospitalization secondary to renal insufficiency in setting of severe AS.   2. Severe aortic valve stenosis: Echo demonstrates severe AS with peak gradient of and mean gradient of 41 mmHg. His aortic stenosis likely contributed to his decompensation recently. Dr. Donata Clay is planning AVR on Thursday 11/29/11. Will plan right heart cath tomorrow to assess PA pressures  pre-op. Clear breakfast in am then NPO for afternoon case around 1pm.   3. Acute Respiratory Failure/Obesity Hypoventilation syndrome/OSA: He has been compliant with his CPAP and doing well. Weight loss and compliance with CPAP would help with his CO2 retention. He understands this.   4. DM: Continue current therapy.   5. Acute renal failure: Resolved.   6. Gout: Improved.   7. Depression: Celexa 20 mg po Qdaily started 11/20/11. Mood much better.   8. Dispo: AVR later this week.    Antonio [name]  1/29/20138:14 AM

## 2011-11-27 NOTE — Progress Notes (Signed)
CSW met with patient and patient's wife to complete advanced directives and Medical POA papers. Papers were signed and notarized. A copy was placed in the shadow chart.

## 2011-11-28 ENCOUNTER — Encounter (HOSPITAL_COMMUNITY)
Admission: EM | Disposition: A | Discharge status: Skilled Nursing Facility | Payer: Self-pay | Source: Home / Self Care | Attending: Cardiothoracic Surgery

## 2011-11-28 ENCOUNTER — Other Ambulatory Visit: Payer: Self-pay

## 2011-11-28 DIAGNOSIS — I359 Nonrheumatic aortic valve disorder, unspecified: Secondary | ICD-10-CM

## 2011-11-28 HISTORY — PX: RIGHT HEART CATHETERIZATION: SHX5447

## 2011-11-28 LAB — BASIC METABOLIC PANEL
BUN: 19 mg/dL (ref 6–23)
CO2: 27 mEq/L (ref 19–32)
Chloride: 103 mEq/L (ref 96–112)
GFR calc Af Amer: >90 mL/min (ref >90)
Potassium: 3.9 mEq/L (ref 3.5–5.1)

## 2011-11-28 LAB — CBC
HCT: 40.1 % (ref 39.0–52.0)
Hemoglobin: 13.1 g/dL (ref 13.0–17.0)
MCHC: 32.7 g/dL (ref 30.0–36.0)
MCV: 82.9 fL (ref 78.0–100.0)
WBC: 9.7 10*3/uL (ref 4.0–10.5)

## 2011-11-28 LAB — POCT I-STAT 3, VENOUS BLOOD GAS (G3P V)
Acid-Base Excess: 2 mmol/L (ref 0.0–2.0)
Bicarbonate: 27.5 mEq/L — ABNORMAL HIGH (ref 20.0–24.0)
O2 Saturation: 61 %
TCO2: 29 mmol/L (ref 0–100)

## 2011-11-28 LAB — GLUCOSE, CAPILLARY
Glucose-Capillary: 104 mg/dL — ABNORMAL HIGH (ref 70–99)
Glucose-Capillary: 70 mg/dL (ref 70–99)

## 2011-11-28 LAB — PREPARE RBC (CROSSMATCH)

## 2011-11-28 SURGERY — RIGHT HEART CATH
Anesthesia: LOCAL

## 2011-11-28 MED ORDER — MAGNESIUM SULFATE 50 % IJ SOLN
40.0000 meq | INTRAMUSCULAR | Status: DC
  Filled 2011-11-28: qty 10

## 2011-11-28 MED ORDER — LIDOCAINE HCL (PF) 1 % IJ SOLN
INTRAMUSCULAR | Status: AC
  Filled 2011-11-28: qty 30

## 2011-11-28 MED ORDER — NITROGLYCERIN 0.2 MG/ML ON CALL CATH LAB
INTRAVENOUS | Status: AC
  Filled 2011-11-28: qty 1

## 2011-11-28 MED ORDER — POTASSIUM CHLORIDE 2 MEQ/ML IV SOLN
80.0000 meq | INTRAVENOUS | Status: DC
  Filled 2011-11-28: qty 40

## 2011-11-28 MED ORDER — SODIUM CHLORIDE 0.9 % IV SOLN
0.1000 ug/kg/h | INTRAVENOUS | Status: AC
  Administered 2011-11-29: .2 ug/kg/h via INTRAVENOUS
  Filled 2011-11-28: qty 4

## 2011-11-28 MED ORDER — NITROGLYCERIN IN D5W 200-5 MCG/ML-% IV SOLN
2.0000 ug/min | INTRAVENOUS | Status: AC
  Administered 2011-11-29: 5 ug/min via INTRAVENOUS
  Filled 2011-11-28: qty 250

## 2011-11-28 MED ORDER — DEXTROSE 5 % IV SOLN
750.0000 mg | INTRAVENOUS | Status: DC
  Filled 2011-11-28: qty 750

## 2011-11-28 MED ORDER — DIAZEPAM 5 MG PO TABS
5.0000 mg | ORAL_TABLET | ORAL | Status: AC
  Administered 2011-11-28: 5 mg via ORAL

## 2011-11-28 MED ORDER — SODIUM CHLORIDE 0.9 % IV SOLN
INTRAVENOUS | Status: AC
  Administered 2011-11-29: .8 [IU]/h via INTRAVENOUS
  Filled 2011-11-28: qty 1

## 2011-11-28 MED ORDER — FENTANYL CITRATE 0.05 MG/ML IJ SOLN
INTRAMUSCULAR | Status: AC
  Filled 2011-11-28: qty 2

## 2011-11-28 MED ORDER — PHENYLEPHRINE HCL 10 MG/ML IJ SOLN
30.0000 ug/min | INTRAVENOUS | Status: DC
  Administered 2011-11-29: 5 ug/min via INTRAVENOUS
  Filled 2011-11-28: qty 2

## 2011-11-28 MED ORDER — DEXTROSE 5 % IV SOLN
1.5000 g | INTRAVENOUS | Status: AC
  Administered 2011-11-29: 1.5 g via INTRAVENOUS
  Administered 2011-11-29: .75 g via INTRAVENOUS
  Filled 2011-11-28: qty 1.5

## 2011-11-28 MED ORDER — VERAPAMIL HCL 2.5 MG/ML IV SOLN
INTRAVENOUS | Status: DC
  Filled 2011-11-28: qty 2.5

## 2011-11-28 MED ORDER — HEPARIN (PORCINE) IN NACL 2-0.9 UNIT/ML-% IJ SOLN
INTRAMUSCULAR | Status: AC
  Filled 2011-11-28: qty 1000

## 2011-11-28 MED ORDER — VANCOMYCIN HCL 1000 MG IV SOLR
1500.0000 mg | INTRAVENOUS | Status: AC
  Administered 2011-11-29: 1500 mg via INTRAVENOUS
  Filled 2011-11-28: qty 1500

## 2011-11-28 MED ORDER — DOPAMINE-DEXTROSE 3.2-5 MG/ML-% IV SOLN
2.0000 ug/kg/min | INTRAVENOUS | Status: DC
  Administered 2011-11-29: 3 ug/kg/min via INTRAVENOUS
  Filled 2011-11-28: qty 250

## 2011-11-28 MED ORDER — MIDAZOLAM HCL 2 MG/2ML IJ SOLN
INTRAMUSCULAR | Status: AC
  Filled 2011-11-28: qty 2

## 2011-11-28 MED ORDER — SODIUM CHLORIDE 0.9 % IV SOLN
INTRAVENOUS | Status: AC
  Administered 2011-11-29: 69.8 mL/h via INTRAVENOUS
  Filled 2011-11-28: qty 40

## 2011-11-28 MED ORDER — EPINEPHRINE HCL 1 MG/ML IJ SOLN
0.5000 ug/min | INTRAVENOUS | Status: DC
  Filled 2011-11-28: qty 4

## 2011-11-28 NOTE — Progress Notes (Signed)
SUBJECTIVE: No complaints this am. No SOB or chest pain.   BP 118/80  Pulse 69  Temp(Src) 98.3 F (36.8 C) (Oral)  Resp 20  Ht 6\' 1"  (1.854 m)  Wt 352 lb 4.7 oz (159.8 kg)  BMI 46.48 kg/m2  SpO2 95%  Intake/Output Summary (Last 24 hours) at 11/28/11 1610 Last data filed at 11/28/11 0116  Gross per 24 hour  Intake    770 ml  Output   1851 ml  Net  -1081 ml    PHYSICAL EXAM General: Well developed, well nourished, in no acute distress. Alert and oriented x 3.  Psych:  Good affect, responds appropriately Neck: No JVD. No masses noted.  Lungs: Clear bilaterally with no wheezes or rhonci noted.  Heart: RRR with loud systolic murmur noted. Abdomen: Bowel sounds are present. Soft, non-tender.  Extremities: Trace lower extremity edema.   LABS: Basic Metabolic Panel:  Basename 11/28/11 0530 11/27/11 0520  NA 141 141  K 3.9 3.8  CL 103 103  CO2 27 27  GLUCOSE 107* 88  BUN 19 19  CREATININE 0.82 0.82  CALCIUM 9.4 9.5  MG -- --  PHOS -- --   CBC:  Basename 11/28/11 0530 11/27/11 0520  WBC 9.7 8.9  NEUTROABS -- --  HGB 13.1 12.7*  HCT 40.1 39.1  MCV 82.9 83.2  PLT 340 364    Fasting Lipid Panel:  Basename 11/27/11 0520  CHOL 112  HDL 26*  LDLCALC 54  TRIG 960*  CHOLHDL 4.3  LDLDIRECT --    Current Meds:    . aspirin EC  81 mg Oral Daily  . bisacodyl  5 mg Oral Once  . carvedilol  12.5 mg Oral BID WC  . cefUROXime (ZINACEF)  IV  1.5 g Intravenous To OR  . cefUROXime (ZINACEF)  IV  750 mg Intravenous To OR  . chlorhexidine  60 mL Topical Once  . chlorhexidine  60 mL Topical Once  . chlorhexidine  60 mL Topical Once  . citalopram  20 mg Oral Daily  . colchicine  0.6 mg Oral BID  . diazepam  5 mg Oral On Call  . enoxaparin  80 mg Subcutaneous QHS  . exenatide  5 mcg Subcutaneous BID WC  . fenofibrate  160 mg Oral Daily  . furosemide  40 mg Oral BID  . glimepiride  4 mg Oral BID WC  . insulin aspart  0-15 Units Subcutaneous TID WC  . insulin  glargine  15 Units Subcutaneous QHS  . metoprolol tartrate  12.5 mg Oral Once  . potassium chloride  40 mEq Oral Daily  . rosuvastatin  20 mg Oral q1800  . sodium chloride  3 mL Intravenous Q12H  . vancomycin  1,500 mg Intravenous To OR     ASSESSMENT AND PLAN:  1. Acute on chronic combined systolic and diastolic heart failure: His heart failure has resolved. Now on po Lasix and tolerating. Lisinopril has been stopped during this hospitalization secondary to renal insufficiency in setting of severe AS.   2. Severe aortic valve stenosis: Echo demonstrates severe AS with peak gradient of and mean gradient of 41 mmHg. His aortic stenosis likely contributed to his decompensation recently. Dr. Donata Clay is planning AVR on Thursday 11/29/11. Will plan right heart cath today. Clear breakfast this am then NPO for afternoon case around 1pm.   3. Acute Respiratory Failure/Obesity Hypoventilation syndrome/OSA: He has been compliant with his CPAP and doing well. Weight loss and compliance with CPAP  would help with his CO2 retention. He understands this.   4. DM: Continue current therapy.   5. Acute renal failure: Resolved.   6. Gout: Improved.   7. Depression: Celexa 20 mg po Qdaily started 11/20/11. Mood much better.   8. Dispo: AVR tomorrow 11/29/11 per Dr. Donata Clay    Antonio [name]  1/30/20137:06 AM

## 2011-11-28 NOTE — Progress Notes (Signed)
CARE MANAGEMENT NOTE HEART FAILURE  11/28/2011   Patient:  Antonio [name], Antonio [name]   Account Number:  [id number]    Date Initiated:  11/16/2011  Documentation initiated by:  Tera Mater  Subjective/Objective Assessment:   61yo male direct admit from cardiology office with SOB, LE edema, orthopnea.  Pt. lives with spouse at home.  HX: CHF, DM, HTN, AS.   Action/Plan:   Spoke with pt. and wife at length about HF book and CPAP use. Pt. has sleep apnea and has a CPAP machine at home, however he refuses to wear it.  Encouraged pt. to use CPAP machine while in hospital.  May benefit from [facility] RN for HF manage.   Anticipated DC Date:  11/19/2011  Anticipated DC Plan:  HOME W HOME HEALTH SERVICES  DC Planning Services:  CM consult    Choice offered to / List presented to:          Status of service:  In process, will continue to follow  Medicare Important Message Given:  NO (If response is "NO", the following Medicare IM given date fields will be blank) Date Medicare IM Given:   Date Additional Medicare IM Given:    Discharge Disposition:    Per UR Regulation:  Reviewed for med. necessity/level of care/duration of stay  Comments:   11/28/11 1200 Spoke to pt. about discharge planning.  Pt. is going for AVR tomorrow 1/31 with Dr. Donata Clay.  Pt. interested in having short term rehab here at [facility]. Pt. states his daughter from [place] is here until 12/14/11 and will be able to assist pt. at home.  If he is unable to qualify for CIR, he is interested in going to SNF, due to his spouse works full time and they are taking care of his 29yr old mother-n-law.  Will forward this information to the [facility] NCM and CSW. Tera Mater, RN, BSN    11/26/11 1230 Pt. diuresed very well past few days and is ambulating in hall.  Dr. Donata Clay has determined that pt. may be eligible to have conventional AV repair due to weight loss.  Pt. wants to have the surgery while he is inpatient this admission.   In addition, Amy Stuckey, HF CSW has spoken to pt. about disability and assisted pt. and family in making living will.  NCM to continue to follow. Tera Mater, RN, BSN [phone]   11/20/11 1100 Spoke with pt. at length about using CPAP machine while sleeping.  Pt. has not used machine in 2 nights.  In addition, [facility] has consulted on pt. for possible BAV.  Pt. states he may have the surgery this week.  Looks better and is able to stay awake during a conversation.  NCM will continue to follow. Tera Mater, RN, BSN   11/19/11 1530 UR Completed. Tera Mater, RN, BSN   Initial CM contact:  11/16/2011 03:00 PM  By:  Tera Mater Initial CSW contact:  11/26/2011 12:30 PM  By:  Sabino Niemann    Is this an INP Readmission < 30 days:  N (If "YES" please see readmission information at the bottom of note)  Patient living status prior to this admission:  FAMILY  Patient setting prior to this admission:  HOME  Comorbid conditions being treated that contributed to this admission:  CHF, AS, DM, HTN  CHF Readmission Risk:  high  Type of patient education provided  HF Patient Education Assessment / Teach Back  HF Zone Tool / Magnet  Weigh daily  Limit  salt intake     Patient education provided by  [facility]    Was referral made to [facility]:  N  Is the patient's PCP the same as attending:  N PCP:    Readmission < 30 Days If pt has HH, did they contact the agency before going to the ED:   Name of [facility] agency:    Was the follow-up physician visit scheduled prior to discharge:    Did the patient follow-up with the physician prior to this readmission:    Was there HF Clinic visits prior to readmission:    Were there ED visits between admissions:    Readmit type:    If unscheduled and related indicate reason for readmit:

## 2011-11-28 NOTE — Progress Notes (Signed)
Nutrition Follow-up  Diet Order:  Heart Healthy, PO intake mostly 100% Per MD note, AVR now on 1/31, R heart cath planned for today 1/30 Meds: Scheduled Meds:   . aspirin EC  81 mg Oral Daily  . bisacodyl  5 mg Oral Once  . carvedilol  12.5 mg Oral BID WC  . cefUROXime (ZINACEF)  IV  1.5 g Intravenous To OR  . cefUROXime (ZINACEF)  IV  750 mg Intravenous To OR  . chlorhexidine  60 mL Topical Once  . chlorhexidine  60 mL Topical Once  . chlorhexidine  60 mL Topical Once  . citalopram  20 mg Oral Daily  . colchicine  0.6 mg Oral BID  . diazepam  5 mg Oral On Call  . enoxaparin  80 mg Subcutaneous QHS  . exenatide  5 mcg Subcutaneous BID WC  . fenofibrate  160 mg Oral Daily  . furosemide  40 mg Oral BID  . glimepiride  4 mg Oral BID WC  . insulin aspart  0-15 Units Subcutaneous TID WC  . insulin glargine  15 Units Subcutaneous QHS  . metoprolol tartrate  12.5 mg Oral Once  . potassium chloride  40 mEq Oral Daily  . rosuvastatin  20 mg Oral q1800  . sodium chloride  3 mL Intravenous Q12H  . vancomycin  1,500 mg Intravenous To OR   Continuous Infusions:  PRN Meds:.sodium chloride, acetaminophen, ALPRAZolam, HYDROcodone-acetaminophen, ondansetron (ZOFRAN) IV, polyethylene glycol, sodium chloride, temazepam  Labs:  CMP     Component Value Date/Time   NA 141 11/28/2011 0530   K 3.9 11/28/2011 0530   CL 103 11/28/2011 0530   CO2 27 11/28/2011 0530   GLUCOSE 107* 11/28/2011 0530   BUN 19 11/28/2011 0530   CREATININE 0.82 11/28/2011 0530   CALCIUM 9.4 11/28/2011 0530   PROT 7.0 11/27/2011 0520   ALBUMIN 3.0* 11/27/2011 0520   AST 18 11/27/2011 0520   ALT 24 11/27/2011 0520   ALKPHOS 69 11/27/2011 0520   BILITOT 0.2* 11/27/2011 0520   GFRNONAA >90 11/28/2011 0530   GFRAA >90 11/28/2011 0530     Intake/Output Summary (Last 24 hours) at 11/28/11 0938 Last data filed at 11/28/11 0830  Gross per 24 hour  Intake    950 ml  Output   1951 ml  Net  -1001 ml    Weight Status:  352 lbs,  weight has been fairly stable x5 days  Re-estimated needs:  Remains unchanged,  1900-2000 kcal and and 110-120 gm protein for weight loss.   Nutrition Dx:  Not ready for diet/lifestyle change, ongoing  Goal:  Meet minimum estimated nutrition needs with PO diet, met, continue  Intervention:  No new intervention at this time.  Monitor:  PO intake, weight, labs, I/O's   Rudean Haskell Pager #:  [phone]

## 2011-11-28 NOTE — Progress Notes (Signed)
Patient discussed at the Long Length of Stay Antonio [name] Weeks 11/28/2011  

## 2011-11-28 NOTE — H&P (View-Only) (Signed)
SUBJECTIVE: No complaints this am. No SOB or chest pain.   BP 118/80  Pulse 69  Temp(Src) 98.3 F (36.8 C) (Oral)  Resp 20  Ht 6' 1" (1.854 m)  Wt 352 lb 4.7 oz (159.8 kg)  BMI 46.48 kg/m2  SpO2 95%  Intake/Output Summary (Last 24 hours) at 11/28/11 0706 Last data filed at 11/28/11 0116  Gross per 24 hour  Intake    770 ml  Output   1851 ml  Net  -1081 ml    PHYSICAL EXAM General: Well developed, well nourished, in no acute distress. Alert and oriented x 3.  Psych:  Good affect, responds appropriately Neck: No JVD. No masses noted.  Lungs: Clear bilaterally with no wheezes or rhonci noted.  Heart: RRR with loud systolic murmur noted. Abdomen: Bowel sounds are present. Soft, non-tender.  Extremities: Trace lower extremity edema.   LABS: Basic Metabolic Panel:  Basename 11/28/11 0530 11/27/11 0520  NA 141 141  K 3.9 3.8  CL 103 103  CO2 27 27  GLUCOSE 107* 88  BUN 19 19  CREATININE 0.82 0.82  CALCIUM 9.4 9.5  MG -- --  PHOS -- --   CBC:  Basename 11/28/11 0530 11/27/11 0520  WBC 9.7 8.9  NEUTROABS -- --  HGB 13.1 12.7*  HCT 40.1 39.1  MCV 82.9 83.2  PLT 340 364    Fasting Lipid Panel:  Basename 11/27/11 0520  CHOL 112  HDL 26*  LDLCALC 54  TRIG 161*  CHOLHDL 4.3  LDLDIRECT --    Current Meds:    . aspirin EC  81 mg Oral Daily  . bisacodyl  5 mg Oral Once  . carvedilol  12.5 mg Oral BID WC  . cefUROXime (ZINACEF)  IV  1.5 g Intravenous To OR  . cefUROXime (ZINACEF)  IV  750 mg Intravenous To OR  . chlorhexidine  60 mL Topical Once  . chlorhexidine  60 mL Topical Once  . chlorhexidine  60 mL Topical Once  . citalopram  20 mg Oral Daily  . colchicine  0.6 mg Oral BID  . diazepam  5 mg Oral On Call  . enoxaparin  80 mg Subcutaneous QHS  . exenatide  5 mcg Subcutaneous BID WC  . fenofibrate  160 mg Oral Daily  . furosemide  40 mg Oral BID  . glimepiride  4 mg Oral BID WC  . insulin aspart  0-15 Units Subcutaneous TID WC  . insulin  glargine  15 Units Subcutaneous QHS  . metoprolol tartrate  12.5 mg Oral Once  . potassium chloride  40 mEq Oral Daily  . rosuvastatin  20 mg Oral q1800  . sodium chloride  3 mL Intravenous Q12H  . vancomycin  1,500 mg Intravenous To OR     ASSESSMENT AND PLAN:  1. Acute on chronic combined systolic and diastolic heart failure: His heart failure has resolved. Now on po Lasix and tolerating. Lisinopril has been stopped during this hospitalization secondary to renal insufficiency in setting of severe AS.   2. Severe aortic valve stenosis: Echo demonstrates severe AS with peak gradient of 78mmHg and mean gradient of 41 mmHg. His aortic stenosis likely contributed to his decompensation recently. Dr. Van Trigt is planning AVR on Thursday 11/29/11. Will plan right heart cath today. Clear breakfast this am then NPO for afternoon case around 1pm.   3. Acute Respiratory Failure/Obesity Hypoventilation syndrome/OSA: He has been compliant with his CPAP and doing well. Weight loss and compliance with CPAP   would help with his CO2 retention. He understands this.   4. DM: Continue current therapy.   5. Acute renal failure: Resolved.   6. Gout: Improved.   7. Depression: Celexa 20 mg po Qdaily started 11/20/11. Mood much better.   8. Dispo: AVR tomorrow 11/29/11 per Dr. Van Trigt    Antonio [name]  1/30/20137:06 AM  

## 2011-11-28 NOTE — Op Note (Signed)
Cardiac Catheterization Operative Report  Mc Bloodworth [id number] 1/30/20133:47 PM [name], MD, MD  Procedure Performed:  1. Right Heart Catheterization  Operator: Verne Carrow, MD  Indication:  Pt has severe AS and is going for AVR tomorrow. Right heart cath today to assess pulmonary pressures.                                    Procedure Details: The risks, benefits, complications, treatment options, and expected outcomes were discussed with the patient. The patient and/or family concurred with the proposed plan, giving informed consent. The patient was brought to the cath lab and was sedated with Versed and Fentanyl. The right groin was prepped and draped in the usual manner. Using the modified Seldinger access technique, a 6 French sheath was placed in the right femoral vein. A multi-purpose catheter was used to perform a right heart catheterization. There were no immediate complications. The patient was taken to the recovery area in stable condition.   Hemodynamic Findings: Peripheral aortic pressure (by cuff): 132/87 RA: 3 RV: 41/2/8 PA: 39/11 (mean 25) PCWP: 22 CO: 6.82 L/min CI: 2.49 L/min/m2 AO sat (peripheral): 91% PA sat: 61% Left ventricular pressure:  Impression: 1. Slightly elevated pulmonary pressures.   Recommendations: Pt will have three hours bedrest then can resume diet. NPO at midnight for AVR tomorrow.        Complications:  None. The patient tolerated the procedure well.

## 2011-11-28 NOTE — Interval H&P Note (Signed)
History and Physical Interval Note:  11/28/2011 3:14 PM  Antonio [name].  has presented today for right heart cath. with the diagnosis of respiratory failure.  The various methods of treatment have been discussed with the patient and family. After consideration of risks, benefits and other options for treatment, the patient has consented to  Procedure(s): RIGHT HEART CATH as a surgical intervention .  The patients' history has been reviewed, patient examined, no change in status, stable for surgery.  I have reviewed the patients' chart and labs.  Questions were answered to the patient's satisfaction.     MCALHANY,CHRISTOPHER

## 2011-11-29 ENCOUNTER — Encounter (HOSPITAL_COMMUNITY)
Admission: EM | Disposition: A | Discharge status: Skilled Nursing Facility | Payer: Self-pay | Source: Home / Self Care | Attending: Cardiothoracic Surgery

## 2011-11-29 ENCOUNTER — Inpatient Hospital Stay (HOSPITAL_COMMUNITY): Payer: BC Managed Care – PPO

## 2011-11-29 ENCOUNTER — Other Ambulatory Visit: Payer: Self-pay

## 2011-11-29 ENCOUNTER — Encounter (HOSPITAL_COMMUNITY): Payer: Self-pay | Admitting: Certified Registered"

## 2011-11-29 ENCOUNTER — Other Ambulatory Visit: Payer: Self-pay | Admitting: Cardiothoracic Surgery

## 2011-11-29 ENCOUNTER — Inpatient Hospital Stay (HOSPITAL_COMMUNITY): Payer: BC Managed Care – PPO | Admitting: Certified Registered"

## 2011-11-29 HISTORY — PX: AORTIC VALVE REPLACEMENT: SHX41

## 2011-11-29 LAB — CBC
HCT: 32.4 % — ABNORMAL LOW (ref 39.0–52.0)
HCT: 25.8 % — ABNORMAL LOW (ref 39.0–52.0)
Hemoglobin: 10.6 g/dL — ABNORMAL LOW (ref 13.0–17.0)
Hemoglobin: 8.5 g/dL — ABNORMAL LOW (ref 13.0–17.0)
MCH: 27.2 pg (ref 26.0–34.0)
MCHC: 32.7 g/dL (ref 30.0–36.0)
MCHC: 32.9 g/dL (ref 30.0–36.0)
MCV: 82.7 fL (ref 78.0–100.0)
Platelets: 213 10*3/uL (ref 150–400)
RBC: 3.12 MIL/uL — ABNORMAL LOW (ref 4.22–5.81)
RDW: 14 % (ref 11.5–15.5)
RDW: 14.1 % (ref 11.5–15.5)
WBC: 19.2 10*3/uL — ABNORMAL HIGH (ref 4.0–10.5)
WBC: 13.2 10*3/uL — ABNORMAL HIGH (ref 4.0–10.5)

## 2011-11-29 LAB — CARBOXYHEMOGLOBIN
Carboxyhemoglobin: 0 % — ABNORMAL LOW (ref 0.5–1.5)
Carboxyhemoglobin: 1.2 % (ref 0.5–1.5)
Carboxyhemoglobin: 1.5 % (ref 0.5–1.5)
Methemoglobin: 1 % (ref 0.0–1.5)
Methemoglobin: 1.2 % (ref 0.0–1.5)
Methemoglobin: 1.8 % — ABNORMAL HIGH (ref 0.0–1.5)
O2 Saturation: 49.8 %
O2 Saturation: 61.1 %
O2 Saturation: 65.6 %
Total hemoglobin: 10.2 g/dL — ABNORMAL LOW (ref 13.5–18.0)
Total hemoglobin: 8 g/dL — ABNORMAL LOW (ref 13.5–18.0)
Total hemoglobin: 8.2 g/dL — ABNORMAL LOW (ref 13.5–18.0)

## 2011-11-29 LAB — POCT I-STAT 4, (NA,K, GLUC, HGB,HCT)
Glucose, Bld: 85 mg/dL (ref 70–99)
Glucose, Bld: 148 mg/dL — ABNORMAL HIGH (ref 70–99)
Glucose, Bld: 204 mg/dL — ABNORMAL HIGH (ref 70–99)
Glucose, Bld: 183 mg/dL — ABNORMAL HIGH (ref 70–99)
Glucose, Bld: 146 mg/dL — ABNORMAL HIGH (ref 70–99)
HCT: 36 % — ABNORMAL LOW (ref 39.0–52.0)
HCT: 33 % — ABNORMAL LOW (ref 39.0–52.0)
HCT: 30 % — ABNORMAL LOW (ref 39.0–52.0)
HCT: 26 % — ABNORMAL LOW (ref 39.0–52.0)
HCT: 31 % — ABNORMAL LOW (ref 39.0–52.0)
HCT: 34 % — ABNORMAL LOW (ref 39.0–52.0)
Hemoglobin: 12.2 g/dL — ABNORMAL LOW (ref 13.0–17.0)
Hemoglobin: 10.2 g/dL — ABNORMAL LOW (ref 13.0–17.0)
Hemoglobin: 8.8 g/dL — ABNORMAL LOW (ref 13.0–17.0)
Potassium: 3.8 mEq/L (ref 3.5–5.1)
Potassium: 3.5 mEq/L (ref 3.5–5.1)
Potassium: 4 mEq/L (ref 3.5–5.1)
Potassium: 3.6 mEq/L (ref 3.5–5.1)
Sodium: 141 mEq/L (ref 135–145)
Sodium: 140 mEq/L (ref 135–145)
Sodium: 142 mEq/L (ref 135–145)

## 2011-11-29 LAB — POCT I-STAT 3, ART BLOOD GAS (G3+)
Acid-base deficit: 2 mmol/L (ref 0.0–2.0)
Acid-base deficit: 1 mmol/L (ref 0.0–2.0)
Bicarbonate: 21.9 mEq/L (ref 20.0–24.0)
Bicarbonate: 22.7 mEq/L (ref 20.0–24.0)
Bicarbonate: 23.5 mEq/L (ref 20.0–24.0)
O2 Saturation: 100 %
O2 Saturation: 94 %
O2 Saturation: 98 %
O2 Saturation: 99 %
Patient temperature: 37.1
Patient temperature: 37.6
TCO2: 23 mmol/L (ref 0–100)
TCO2: 24 mmol/L (ref 0–100)
TCO2: 25 mmol/L (ref 0–100)
pCO2 arterial: 44.1 mmHg (ref 35.0–45.0)
pCO2 arterial: 34.4 mmHg — ABNORMAL LOW (ref 35.0–45.0)
pCO2 arterial: 41.7 mmHg (ref 35.0–45.0)
pCO2 arterial: 40.3 mmHg (ref 35.0–45.0)
pH, Arterial: 7.454 — ABNORMAL HIGH (ref 7.350–7.450)
pH, Arterial: 7.366 (ref 7.350–7.450)
pH, Arterial: 7.342 — ABNORMAL LOW (ref 7.350–7.450)
pO2, Arterial: 372 mmHg — ABNORMAL HIGH (ref 80.0–100.0)

## 2011-11-29 LAB — PROTIME-INR
INR: 1.91 — ABNORMAL HIGH (ref 0.00–1.49)
INR: 1.76 — ABNORMAL HIGH (ref 0.00–1.49)
Prothrombin Time: 22.2 seconds — ABNORMAL HIGH (ref 11.6–15.2)
Prothrombin Time: 20.8 seconds — ABNORMAL HIGH (ref 11.6–15.2)

## 2011-11-29 LAB — APTT
aPTT: 35 seconds (ref 24–37)
aPTT: 31 seconds (ref 24–37)

## 2011-11-29 LAB — HEMOGLOBIN AND HEMATOCRIT, BLOOD
HCT: 29.4 % — ABNORMAL LOW (ref 39.0–52.0)
HCT: 29.7 % — ABNORMAL LOW (ref 39.0–52.0)
Hemoglobin: 9.8 g/dL — ABNORMAL LOW (ref 13.0–17.0)
Hemoglobin: 9.8 g/dL — ABNORMAL LOW (ref 13.0–17.0)

## 2011-11-29 LAB — PLATELET COUNT
Platelets: 209 10*3/uL (ref 150–400)
Platelets: 165 10*3/uL (ref 150–400)

## 2011-11-29 LAB — CREATININE, SERUM
Creatinine, Ser: 0.75 mg/dL (ref 0.50–1.35)
GFR calc Af Amer: >90 mL/min (ref >90)
GFR calc non Af Amer: >90 mL/min (ref >90)

## 2011-11-29 LAB — POCT I-STAT, CHEM 8
BUN: 14 mg/dL (ref 6–23)
Calcium, Ion: 1.2 mmol/L (ref 1.12–1.32)
Chloride: 107 mEq/L (ref 96–112)
Creatinine, Ser: 1 mg/dL (ref 0.50–1.35)
Glucose, Bld: 145 mg/dL — ABNORMAL HIGH (ref 70–99)
TCO2: 24 mmol/L (ref 0–100)

## 2011-11-29 LAB — GLUCOSE, CAPILLARY
Glucose-Capillary: 101 mg/dL — ABNORMAL HIGH (ref 70–99)
Glucose-Capillary: 106 mg/dL — ABNORMAL HIGH (ref 70–99)

## 2011-11-29 LAB — MAGNESIUM: Magnesium: 2.3 mg/dL (ref 1.5–2.5)

## 2011-11-29 SURGERY — REPLACEMENT, AORTIC VALVE, OPEN
Anesthesia: General | Site: Chest | Wound class: Clean

## 2011-11-29 MED ORDER — PANTOPRAZOLE SODIUM 40 MG PO TBEC
40.0000 mg | DELAYED_RELEASE_TABLET | Freq: Every day | ORAL | Status: DC
  Administered 2011-12-01 – 2011-12-16 (×15): 40 mg via ORAL
  Filled 2011-11-29 (×15): qty 1

## 2011-11-29 MED ORDER — SODIUM CHLORIDE 0.9 % IV SOLN
5.0000 g | INTRAVENOUS | Status: DC
  Filled 2011-11-29: qty 20

## 2011-11-29 MED ORDER — DOCUSATE SODIUM 100 MG PO CAPS
200.0000 mg | ORAL_CAPSULE | Freq: Every day | ORAL | Status: DC
  Administered 2011-12-02 – 2011-12-11 (×5): 200 mg via ORAL
  Filled 2011-11-29 (×8): qty 2

## 2011-11-29 MED ORDER — ESMOLOL HCL 10 MG/ML IV SOLN
INTRAVENOUS | Status: DC | PRN
  Administered 2011-11-29 (×2): 30 mg via INTRAVENOUS
  Administered 2011-11-29: 40 mg via INTRAVENOUS

## 2011-11-29 MED ORDER — BISACODYL 10 MG RE SUPP: 10.0000 mg | Freq: Every day | RECTAL | Status: DC

## 2011-11-29 MED ORDER — SODIUM CHLORIDE 0.9 % IV SOLN: INTRAVENOUS | Status: DC

## 2011-11-29 MED ORDER — CALCIUM CHLORIDE 10 % IV SOLN
1.0000 g | Freq: Once | INTRAVENOUS | Status: AC
  Administered 2011-11-29: 1 g via INTRAVENOUS

## 2011-11-29 MED ORDER — SUFENTANIL CITRATE 50 MCG/ML IV SOLN
INTRAVENOUS | Status: DC | PRN
  Administered 2011-11-29 (×2): 30 ug via INTRAVENOUS
  Administered 2011-11-29: 20 ug via INTRAVENOUS
  Administered 2011-11-29: 30 ug via INTRAVENOUS
  Administered 2011-11-29: 40 ug via INTRAVENOUS
  Administered 2011-11-29: 30 ug via INTRAVENOUS
  Administered 2011-11-29: 20 ug via INTRAVENOUS

## 2011-11-29 MED ORDER — SODIUM CHLORIDE 0.45 % IV SOLN: INTRAVENOUS | Status: DC

## 2011-11-29 MED ORDER — ALBUMIN HUMAN 5 % IV SOLN
INTRAVENOUS | Status: DC | PRN
  Administered 2011-11-29 (×2): via INTRAVENOUS

## 2011-11-29 MED ORDER — SODIUM CHLORIDE 0.9 % IV SOLN: 250.0000 mL | INTRAVENOUS | Status: DC

## 2011-11-29 MED ORDER — SODIUM CHLORIDE 0.9 % IV SOLN
0.4000 ug/kg/h | INTRAVENOUS | Status: DC
  Filled 2011-11-29: qty 2

## 2011-11-29 MED ORDER — SODIUM CHLORIDE 0.9 % IV SOLN
0.1000 ug/kg/h | INTRAVENOUS | Status: DC
  Filled 2011-11-29 (×3): qty 2

## 2011-11-29 MED ORDER — ACETAMINOPHEN 650 MG RE SUPP
650.0000 mg | RECTAL | Status: AC
  Administered 2011-11-29: 650 mg via RECTAL

## 2011-11-29 MED ORDER — LACTATED RINGERS IV SOLN
INTRAVENOUS | Status: DC | PRN
  Administered 2011-11-29 (×6): via INTRAVENOUS

## 2011-11-29 MED ORDER — NITROGLYCERIN IN D5W 200-5 MCG/ML-% IV SOLN: 0.0000 ug/min | INTRAVENOUS | Status: DC

## 2011-11-29 MED ORDER — DEXTROSE 5 % IV SOLN
1.5000 g | Freq: Two times a day (BID) | INTRAVENOUS | Status: AC
  Administered 2011-11-29 – 2011-12-01 (×4): 1.5 g via INTRAVENOUS
  Filled 2011-11-29 (×4): qty 1.5

## 2011-11-29 MED ORDER — SODIUM BICARBONATE 8.4 % IV SOLN: 25.0000 meq | Freq: Once | INTRAVENOUS | Status: AC

## 2011-11-29 MED ORDER — BISACODYL 5 MG PO TBEC
10.0000 mg | DELAYED_RELEASE_TABLET | Freq: Every day | ORAL | Status: DC
  Administered 2011-12-02 – 2011-12-11 (×3): 10 mg via ORAL
  Filled 2011-11-29 (×5): qty 2

## 2011-11-29 MED ORDER — MIDAZOLAM HCL 2 MG/2ML IJ SOLN
2.0000 mg | INTRAMUSCULAR | Status: DC | PRN
  Administered 2011-11-29 – 2011-11-30 (×8): 2 mg via INTRAVENOUS
  Filled 2011-11-29 (×9): qty 2

## 2011-11-29 MED ORDER — HEMOSTATIC AGENTS (NO CHARGE) OPTIME
TOPICAL | Status: DC | PRN
  Administered 2011-11-29: 1 via TOPICAL

## 2011-11-29 MED ORDER — SODIUM CHLORIDE 0.9 % IV SOLN
0.4000 ug/kg/h | Freq: Once | INTRAVENOUS | Status: AC
  Administered 2011-11-29: 0.7 ug/kg/h via INTRAVENOUS
  Filled 2011-11-29: qty 2

## 2011-11-29 MED ORDER — SODIUM CHLORIDE 0.9 % IR SOLN
Status: DC | PRN
  Administered 2011-11-29: 10:00:00

## 2011-11-29 MED ORDER — POTASSIUM CHLORIDE 10 MEQ/50ML IV SOLN
10.0000 meq | INTRAVENOUS | Status: AC
  Administered 2011-11-29 (×3): 10 meq via INTRAVENOUS

## 2011-11-29 MED ORDER — VECURONIUM BROMIDE 10 MG IV SOLR
INTRAVENOUS | Status: DC | PRN
  Administered 2011-11-29: 5 mg via INTRAVENOUS
  Administered 2011-11-29: 10 mg via INTRAVENOUS
  Administered 2011-11-29: 4 mg via INTRAVENOUS
  Administered 2011-11-29: 3 mg via INTRAVENOUS
  Administered 2011-11-29: 10 mg via INTRAVENOUS
  Administered 2011-11-29: 3 mg via INTRAVENOUS
  Administered 2011-11-29: 5 mg via INTRAVENOUS

## 2011-11-29 MED ORDER — MORPHINE SULFATE 4 MG/ML IJ SOLN
2.0000 mg | INTRAMUSCULAR | Status: DC | PRN
  Administered 2011-11-30 – 2011-12-02 (×5): 4 mg via INTRAVENOUS
  Administered 2011-12-02 (×2): 2 mg via INTRAVENOUS
  Administered 2011-12-02 – 2011-12-03 (×3): 4 mg via INTRAVENOUS
  Filled 2011-11-29 (×10): qty 1

## 2011-11-29 MED ORDER — AMIODARONE HCL IN DEXTROSE 360-4.14 MG/200ML-% IV SOLN
0.5000 mg/min | INTRAVENOUS | Status: DC
  Administered 2011-11-30 – 2011-12-02 (×3): 0.5 mg/min via INTRAVENOUS
  Filled 2011-11-29 (×11): qty 200

## 2011-11-29 MED ORDER — MAGNESIUM SULFATE 40 MG/ML IJ SOLN
4.0000 g | Freq: Once | INTRAMUSCULAR | Status: AC
  Administered 2011-11-29: 4 g via INTRAVENOUS
  Filled 2011-11-29: qty 100

## 2011-11-29 MED ORDER — HEMOSTATIC AGENTS (NO CHARGE) OPTIME
TOPICAL | Status: DC | PRN
  Administered 2011-11-29: 2 via TOPICAL

## 2011-11-29 MED ORDER — ACETAMINOPHEN 160 MG/5ML PO SOLN
975.0000 mg | Freq: Four times a day (QID) | ORAL | Status: AC
  Administered 2011-11-29 – 2011-12-01 (×7): 975 mg
  Filled 2011-11-29 (×6): qty 40.6

## 2011-11-29 MED ORDER — PHENYLEPHRINE HCL 10 MG/ML IJ SOLN
0.0000 ug/min | INTRAVENOUS | Status: DC
  Administered 2011-11-29: 15 ug/min via INTRAVENOUS
  Administered 2011-11-29: 40 ug/min via INTRAVENOUS
  Administered 2011-11-30: 10 ug/min via INTRAVENOUS
  Filled 2011-11-29 (×4): qty 2

## 2011-11-29 MED ORDER — ALBUMIN HUMAN 5 % IV SOLN
250.0000 mL | INTRAVENOUS | Status: AC | PRN
  Administered 2011-11-29 (×4): 250 mL via INTRAVENOUS
  Filled 2011-11-29: qty 500
  Filled 2011-11-29: qty 250

## 2011-11-29 MED ORDER — SODIUM CHLORIDE 0.9 % IV SOLN
20.0000 ug | Freq: Once | INTRAVENOUS | Status: AC
  Administered 2011-11-29: 20 ug via INTRAVENOUS
  Filled 2011-11-29: qty 5

## 2011-11-29 MED ORDER — SODIUM CHLORIDE 0.9 % IV SOLN
1000.0000 mg | Freq: Once | INTRAVENOUS | Status: AC
  Administered 2011-11-29: 1000 mg via INTRAVENOUS
  Filled 2011-11-29: qty 1000

## 2011-11-29 MED ORDER — METOPROLOL TARTRATE 1 MG/ML IV SOLN
2.5000 mg | INTRAVENOUS | Status: DC | PRN
  Administered 2011-11-29: 1 mg via INTRAVENOUS
  Filled 2011-11-29: qty 5

## 2011-11-29 MED ORDER — SODIUM CHLORIDE 0.9 % IJ SOLN
3.0000 mL | Freq: Two times a day (BID) | INTRAMUSCULAR | Status: DC
  Administered 2011-11-30 – 2011-12-02 (×6): 3 mL via INTRAVENOUS
  Administered 2011-12-03: 11:00:00 via INTRAVENOUS

## 2011-11-29 MED ORDER — TRAMADOL HCL 50 MG PO TABS
50.0000 mg | ORAL_TABLET | ORAL | Status: DC | PRN
  Administered 2011-12-01 – 2011-12-05 (×7): 100 mg via ORAL
  Administered 2011-12-05: 50 mg via ORAL
  Administered 2011-12-07: 100 mg via ORAL
  Filled 2011-11-29 (×6): qty 2
  Filled 2011-11-29: qty 1
  Filled 2011-11-29 (×2): qty 2

## 2011-11-29 MED ORDER — HEPARIN SODIUM (PORCINE) 1000 UNIT/ML IJ SOLN
INTRAMUSCULAR | Status: DC | PRN
  Administered 2011-11-29: 50000 [IU] via INTRAVENOUS
  Administered 2011-11-29: 25000 [IU] via INTRAVENOUS

## 2011-11-29 MED ORDER — PHENYLEPHRINE HCL 10 MG/ML IJ SOLN
10.0000 mg | INTRAVENOUS | Status: DC | PRN
  Administered 2011-11-29: 10 ug/min via INTRAVENOUS

## 2011-11-29 MED ORDER — SODIUM CHLORIDE 0.9 % IR SOLN
Status: DC | PRN
  Administered 2011-11-29: 9000 mL

## 2011-11-29 MED ORDER — DIGOXIN 0.25 MG/ML IJ SOLN
0.2500 mg | Freq: Every day | INTRAMUSCULAR | Status: DC
  Administered 2011-11-29: 0.25 mg via INTRAVENOUS
  Administered 2011-11-30: 09:00:00 via INTRAVENOUS
  Administered 2011-12-01 – 2011-12-02 (×2): 0.25 mg via INTRAVENOUS
  Filled 2011-11-29 (×8): qty 1

## 2011-11-29 MED ORDER — SODIUM CHLORIDE 0.9 % IJ SOLN: 3.0000 mL | INTRAMUSCULAR | Status: DC | PRN

## 2011-11-29 MED ORDER — PROTAMINE SULFATE 10 MG/ML IV SOLN
INTRAVENOUS | Status: DC | PRN
  Administered 2011-11-29 (×2): 300 mg via INTRAVENOUS

## 2011-11-29 MED ORDER — ACETAMINOPHEN 500 MG PO TABS
1000.0000 mg | ORAL_TABLET | Freq: Four times a day (QID) | ORAL | Status: AC
  Administered 2011-12-01 – 2011-12-04 (×11): 1000 mg via ORAL
  Filled 2011-11-29 (×17): qty 2

## 2011-11-29 MED ORDER — AMIODARONE HCL IN DEXTROSE 360-4.14 MG/200ML-% IV SOLN
1.0000 mg/min | INTRAVENOUS | Status: AC
  Administered 2011-11-29 (×2): 1 mg/min via INTRAVENOUS
  Filled 2011-11-29 (×2): qty 200

## 2011-11-29 MED ORDER — SODIUM BICARBONATE 8.4 % IV SOLN
INTRAVENOUS | Status: AC
  Administered 2011-11-29: 50 meq
  Filled 2011-11-29: qty 50

## 2011-11-29 MED ORDER — DOPAMINE-DEXTROSE 3.2-5 MG/ML-% IV SOLN
2.5000 ug/kg/min | INTRAVENOUS | Status: DC
  Administered 2011-11-30: 5 ug/kg/min via INTRAVENOUS
  Administered 2011-12-01: 4 ug/kg/min via INTRAVENOUS
  Administered 2011-12-02: 3 ug/kg/min via INTRAVENOUS
  Filled 2011-11-29 (×3): qty 250

## 2011-11-29 MED ORDER — THROMBIN 5000 UNITS EX SOLR: OROMUCOSAL | Status: DC | PRN

## 2011-11-29 MED ORDER — PROPOFOL 10 MG/ML IV EMUL
INTRAVENOUS | Status: DC | PRN
  Administered 2011-11-29: 200 mg via INTRAVENOUS

## 2011-11-29 MED ORDER — DOPAMINE-DEXTROSE 3.2-5 MG/ML-% IV SOLN: 0.0000 ug/kg/min | INTRAVENOUS | Status: DC

## 2011-11-29 MED ORDER — CHLORHEXIDINE GLUCONATE 0.12 % MT SOLN
OROMUCOSAL | Status: AC
  Administered 2011-11-29: 15 mL
  Filled 2011-11-29: qty 15

## 2011-11-29 MED ORDER — INSULIN REGULAR BOLUS VIA INFUSION
0.0000 [IU] | Freq: Three times a day (TID) | INTRAVENOUS | Status: DC
  Filled 2011-11-29: qty 10

## 2011-11-29 MED ORDER — MIDAZOLAM HCL 5 MG/5ML IJ SOLN
INTRAMUSCULAR | Status: DC | PRN
  Administered 2011-11-29: 2 mg via INTRAVENOUS
  Administered 2011-11-29 (×2): 3 mg via INTRAVENOUS
  Administered 2011-11-29: 5 mg via INTRAVENOUS
  Administered 2011-11-29: 4 mg via INTRAVENOUS
  Administered 2011-11-29: 1 mg via INTRAVENOUS

## 2011-11-29 MED ORDER — SODIUM CHLORIDE 0.9 % IV SOLN
INTRAVENOUS | Status: DC
  Administered 2011-11-29: 22:00:00 via INTRAVENOUS
  Administered 2011-12-01: 11.6 [IU] via INTRAVENOUS
  Administered 2011-12-01: 7.6 [IU] via INTRAVENOUS
  Filled 2011-11-29 (×3): qty 1

## 2011-11-29 MED ORDER — MORPHINE SULFATE 2 MG/ML IJ SOLN
1.0000 mg | INTRAMUSCULAR | Status: AC | PRN
  Administered 2011-11-29: 4 mg via INTRAVENOUS
  Administered 2011-11-29 (×2): 2 mg via INTRAVENOUS
  Filled 2011-11-29 (×2): qty 1
  Filled 2011-11-29: qty 2

## 2011-11-29 MED ORDER — METOPROLOL TARTRATE 25 MG/10 ML ORAL SUSPENSION
12.5000 mg | Freq: Two times a day (BID) | ORAL | Status: DC
  Administered 2011-12-02: 12.5 mg
  Filled 2011-11-29 (×17): qty 5

## 2011-11-29 MED ORDER — AMIODARONE LOAD VIA INFUSION
150.0000 mg | Freq: Once | INTRAVENOUS | Status: AC
  Administered 2011-11-29: 150 mg via INTRAVENOUS
  Filled 2011-11-29: qty 83.34

## 2011-11-29 MED ORDER — LACTATED RINGERS IV SOLN
INTRAVENOUS | Status: DC
  Administered 2011-11-29: 20 mL/h via INTRAVENOUS
  Administered 2011-12-01: 23:00:00 via INTRAVENOUS

## 2011-11-29 MED ORDER — LACTATED RINGERS IV SOLN: 500.0000 mL | Freq: Once | INTRAVENOUS | Status: AC | PRN

## 2011-11-29 MED ORDER — ONDANSETRON HCL 4 MG/2ML IJ SOLN
4.0000 mg | Freq: Four times a day (QID) | INTRAMUSCULAR | Status: DC | PRN
  Administered 2011-12-02 – 2011-12-05 (×3): 4 mg via INTRAVENOUS
  Filled 2011-11-29 (×3): qty 2

## 2011-11-29 MED ORDER — ACETAMINOPHEN 160 MG/5ML PO SOLN: 650.0000 mg | ORAL | Status: AC

## 2011-11-29 MED ORDER — FAMOTIDINE IN NACL 20-0.9 MG/50ML-% IV SOLN
20.0000 mg | Freq: Two times a day (BID) | INTRAVENOUS | Status: AC
  Administered 2011-11-29 (×2): 20 mg via INTRAVENOUS
  Filled 2011-11-29: qty 50

## 2011-11-29 MED ORDER — METOPROLOL TARTRATE 12.5 MG HALF TABLET
12.5000 mg | ORAL_TABLET | Freq: Two times a day (BID) | ORAL | Status: DC
  Administered 2011-12-02 – 2011-12-17 (×30): 12.5 mg via ORAL
  Filled 2011-11-29 (×38): qty 1

## 2011-11-29 MED ORDER — SODIUM CHLORIDE 0.9 % IV SOLN
0.4000 ug/kg/h | INTRAVENOUS | Status: DC
  Administered 2011-11-29: 0.8 ug/kg/h via INTRAVENOUS
  Administered 2011-11-29 – 2011-12-01 (×12): 1.2 ug/kg/h via INTRAVENOUS
  Filled 2011-11-29 (×18): qty 4

## 2011-11-29 MED ORDER — COLCHICINE 0.6 MG PO TABS
0.6000 mg | ORAL_TABLET | Freq: Two times a day (BID) | ORAL | Status: DC
  Administered 2011-11-30 – 2011-12-17 (×33): 0.6 mg via ORAL
  Filled 2011-11-29 (×37): qty 1

## 2011-11-29 SURGICAL SUPPLY — 98 items
ADAPTER CARDIO PERF ANTE/RETRO (ADAPTER) ×3 IMPLANT
ADH SRG 12 PREFL SYR 3 SPRDR (MISCELLANEOUS)
ADPR PRFSN 84XANTGRD RTRGD (ADAPTER) ×1
ATTRACTOMAT 16X20 MAGNETIC DRP (DRAPES) ×3 IMPLANT
BAG DECANTER FOR FLEXI CONT (MISCELLANEOUS) ×3 IMPLANT
BLADE SAW STERNAL (BLADE) ×3 IMPLANT
BLADE SURG 12 STRL SS (BLADE) ×3 IMPLANT
BLADE SURG 15 STRL LF DISP TIS (BLADE) ×1 IMPLANT
BLADE SURG 15 STRL SS (BLADE) ×3
BOVIE (MISCELLANEOUS) ×2 IMPLANT
CANISTER SUCTION 2500CC (MISCELLANEOUS) ×3 IMPLANT
CANN PRFSN .5XCNCT 15X34-48 (MISCELLANEOUS) ×1
CANNULA GUNDRY RCSP 15FR (MISCELLANEOUS) ×3 IMPLANT
CANNULA PRFSN .5XCNCT 15X34-48 (MISCELLANEOUS) IMPLANT
CANNULA VEN 2 STAGE (MISCELLANEOUS) ×3
CATH CPB KIT VANTRIGT (MISCELLANEOUS) ×3 IMPLANT
CATH RETROPLEGIA CORONARY 14FR (CATHETERS) ×5 IMPLANT
CATH ROBINSON RED A/P 18FR (CATHETERS) ×9 IMPLANT
CATH THORACIC 36FR RT ANG (CATHETERS) ×3 IMPLANT
CATH VENTRICULAR 1.5X2.8 (CATHETERS) ×2 IMPLANT
CLIP FOGARTY SPRING 6M (CLIP) IMPLANT
CLOTH BEACON ORANGE TIMEOUT ST (SAFETY) ×3 IMPLANT
CONT SPEC 4OZ CLIKSEAL STRL BL (MISCELLANEOUS) ×2 IMPLANT
COVER SURGICAL LIGHT HANDLE (MISCELLANEOUS) ×6 IMPLANT
CRADLE DONUT ADULT HEAD (MISCELLANEOUS) ×3 IMPLANT
DRAIN CHANNEL 32F RND 10.7 FF (WOUND CARE) ×3 IMPLANT
DRAPE CARDIOVASCULAR INCISE (DRAPES) ×3
DRAPE SLUSH MACHINE 52X66 (DRAPES) IMPLANT
DRAPE SLUSH/WARMER DISC (DRAPES) IMPLANT
DRAPE SRG 135X102X78XABS (DRAPES) ×1 IMPLANT
DRSG COVADERM 4X14 (GAUZE/BANDAGES/DRESSINGS) ×3 IMPLANT
ELECT BLADE 4.0 EZ CLEAN MEGAD (MISCELLANEOUS) ×3
ELECT BLADE 6.5 EXT (BLADE) ×3 IMPLANT
ELECT CAUTERY BLADE 6.4 (BLADE) ×3 IMPLANT
ELECT REM PT RETURN 9FT ADLT (ELECTROSURGICAL) ×6
ELECTRODE BLDE 4.0 EZ CLN MEGD (MISCELLANEOUS) ×1 IMPLANT
ELECTRODE REM PT RTRN 9FT ADLT (ELECTROSURGICAL) ×2 IMPLANT
GAUZE SPONGE 4X4 12PLY STRL LF (GAUZE/BANDAGES/DRESSINGS) ×2 IMPLANT
GLOVE BIO SURGEON STRL SZ7.5 (GLOVE) ×6 IMPLANT
GLOVE BIOGEL M 7.0 STRL (GLOVE) ×6 IMPLANT
GLOVE BIOGEL M STER SZ 6 (GLOVE) ×12 IMPLANT
GOWN STRL NON-REIN LRG LVL3 (GOWN DISPOSABLE) ×16 IMPLANT
HEMOSTAT POWDER SURGIFOAM 1G (HEMOSTASIS) ×9 IMPLANT
HEMOSTAT SURGICEL 2X14 (HEMOSTASIS) ×3 IMPLANT
INSERT FOGARTY XLG (MISCELLANEOUS) IMPLANT
KIT BASIN OR (CUSTOM PROCEDURE TRAY) ×3 IMPLANT
KIT ROOM TURNOVER OR (KITS) ×3 IMPLANT
KIT SUCTION CATH 14FR (SUCTIONS) ×3 IMPLANT
LEAD PACING MYOCARDI (MISCELLANEOUS) ×3 IMPLANT
LINE VENT (MISCELLANEOUS) ×2 IMPLANT
NS IRRIG 1000ML POUR BTL (IV SOLUTION) ×15 IMPLANT
PACK OPEN HEART (CUSTOM PROCEDURE TRAY) ×3 IMPLANT
PAD ARMBOARD 7.5X6 YLW CONV (MISCELLANEOUS) ×6 IMPLANT
PLEDGET 1/4X1/8X1/16 (MISCELLANEOUS) IMPLANT
PLEDGETS 1/4X1/8X1/16 (MISCELLANEOUS) ×3
SPONGE GAUZE 4X4 12PLY (GAUZE/BANDAGES/DRESSINGS) ×6 IMPLANT
SPONGE LAP 18X18 X RAY DECT (DISPOSABLE) ×2 IMPLANT
SUT BONE WAX W31G (SUTURE) ×3 IMPLANT
SUT ETHIBON 2 0 V 52N 30 (SUTURE) ×7 IMPLANT
SUT ETHIBON EXCEL 2-0 V-5 (SUTURE) IMPLANT
SUT ETHIBOND (SUTURE) ×2 IMPLANT
SUT ETHIBOND 2 0 SH (SUTURE)
SUT ETHIBOND 2 0 SH 36X2 (SUTURE) IMPLANT
SUT ETHIBOND 2 0 V4 (SUTURE) IMPLANT
SUT ETHIBOND 2 0V4 GREEN (SUTURE) IMPLANT
SUT ETHIBOND 2-0 RB-1 WHT (SUTURE) ×4 IMPLANT
SUT ETHIBOND 4 0 RB 1 (SUTURE) ×2 IMPLANT
SUT ETHIBOND V-5 VALVE (SUTURE) IMPLANT
SUT PROLENE 3 0 RB 1 (SUTURE) ×4 IMPLANT
SUT PROLENE 3 0 SH 1 (SUTURE) IMPLANT
SUT PROLENE 3 0 SH DA (SUTURE) IMPLANT
SUT PROLENE 4 0 RB 1 (SUTURE) ×45
SUT PROLENE 4 0 SH DA (SUTURE) ×19 IMPLANT
SUT PROLENE 4-0 RB1 .5 CRCL 36 (SUTURE) ×1 IMPLANT
SUT PROLENE 5 0 C 1 36 (SUTURE) IMPLANT
SUT PROLENE BLUE 7 0 (SUTURE) ×3 IMPLANT
SUT SILK  1 MH (SUTURE)
SUT SILK 1 MH (SUTURE) IMPLANT
SUT SILK 2 0 SH CR/8 (SUTURE) ×4 IMPLANT
SUT SILK 2 0 TIES 10X30 (SUTURE) ×2 IMPLANT
SUT SILK 3 0 SH CR/8 (SUTURE) IMPLANT
SUT STEEL 6MS V (SUTURE) ×6 IMPLANT
SUT STEEL STERNAL CCS#1 18IN (SUTURE) ×4 IMPLANT
SUT STEEL SZ 6 DBL 3X14 BALL (SUTURE) ×3 IMPLANT
SUT VIC AB 1 CTX 18 (SUTURE) ×2 IMPLANT
SUT VIC AB 1 CTX 36 (SUTURE) ×15
SUT VIC AB 1 CTX36XBRD ANBCTR (SUTURE) ×2 IMPLANT
SUT VIC AB 2-0 CTX 27 (SUTURE) IMPLANT
SUT VIC AB 3-0 X1 27 (SUTURE) IMPLANT
SYR 10ML KIT SKIN ADHESIVE (MISCELLANEOUS) IMPLANT
SYSTEM SAHARA CHEST DRAIN ATS (WOUND CARE) ×3 IMPLANT
TAPE CLOTH SURG 4X10 WHT LF (GAUZE/BANDAGES/DRESSINGS) ×2 IMPLANT
TOWEL OR 17X24 6PK STRL BLUE (TOWEL DISPOSABLE) ×6 IMPLANT
TOWEL OR 17X26 10 PK STRL BLUE (TOWEL DISPOSABLE) ×6 IMPLANT
TRAY FOLEY IC TEMP SENS 16FR (CATHETERS) ×3 IMPLANT
UNDERPAD 30X30 INCONTINENT (UNDERPADS AND DIAPERS) ×3 IMPLANT
VALVE MAGNA EASE AORTIC 27MM (Prosthesis & Implant Heart) ×2 IMPLANT
WATER STERILE IRR 1000ML POUR (IV SOLUTION) ×6 IMPLANT

## 2011-11-29 NOTE — Anesthesia Preprocedure Evaluation (Addendum)
Anesthesia Evaluation  Patient identified by MRN, date of birth, ID band Patient awake    Reviewed: Allergy & Precautions, H&P , NPO status , Patient's Chart, lab work & pertinent test results, reviewed documented beta blocker date and time   Airway Mallampati: I TM Distance: >3 FB Neck ROM: Full    Dental  (+) Teeth Intact   Pulmonary shortness of breath and lying, sleep apnea and Continuous Positive Airway Pressure Ventilation ,  clear to auscultation        Cardiovascular hypertension, Pt. on medications + Valvular Problems/Murmurs AS Regular + Systolic murmurs EF - 55-60%  cardiomyopathy   Neuro/Psych    GI/Hepatic GERD-  Medicated,(+) Hepatitis -, UnspecifiedHX of Hepatitis, LFTs now normal   Endo/Other  Diabetes mellitus-, Type obesity  Renal/GU Renal diseaseHX of ARF, now resolved     Musculoskeletal   Abdominal   Peds  Hematology   Anesthesia Other Findings   Reproductive/Obstetrics                        Anesthesia Physical Anesthesia Plan  ASA: IV  Anesthesia Plan: General   Post-op Pain Management:    Induction: Intravenous  Airway Management Planned: Oral ETT  Additional Equipment: Arterial line, PA Cath, CVP and TEE  Intra-op Plan:   Post-operative Plan: Post-operative intubation/ventilation  Informed Consent: I have reviewed the patients History and Physical, chart, labs and discussed the procedure including the risks, benefits and alternatives for the proposed anesthesia with the patient or authorized representative who has indicated his/her understanding and acceptance.   Dental advisory given  Plan Discussed with: CRNA, Anesthesiologist and Surgeon  Anesthesia Plan Comments:        Anesthesia Quick Evaluation

## 2011-11-29 NOTE — Transfer of Care (Signed)
Immediate Anesthesia Transfer of Care Note  Patient: Antonio [name].  Procedure(s) Performed:  AORTIC VALVE REPLACEMENT (AVR) - with nitric oxide  Patient Location: SICU  Anesthesia Type: General  Level of Consciousness: Patient remains intubated per anesthesia plan  Airway & Oxygen Therapy: Patient remains intubated per anesthesia plan  Post-op Assessment: Post -op Vital signs reviewed and stable  Post vital signs: Reviewed and stable  Complications: No apparent anesthesia complications

## 2011-11-29 NOTE — OR Nursing (Signed)
Jolinda Croak kept advised of the patient situation. Calls to 2300 and patient info desk at off pump at 13:33.

## 2011-11-29 NOTE — Anesthesia Procedure Notes (Signed)
Procedure Name: Intubation Date/Time: 11/29/2011 7:46 AM Performed by: Glendora Score Pre-anesthesia Checklist: Patient identified, Emergency Drugs available, Suction available and Patient being monitored Patient Re-evaluated:Patient Re-evaluated prior to inductionOxygen Delivery Method: Circle System Utilized Preoxygenation: Pre-oxygenation with 100% oxygen Intubation Type: IV induction Ventilation: Mask ventilation without difficulty and Oral airway inserted - appropriate to patient size Laryngoscope Size: Miller and 3 Grade View: Grade I Tube type: Oral Tube size: 8.0 mm Number of attempts: 1 Airway Equipment and Method: stylet Placement Confirmation: ETT inserted through vocal cords under direct vision,  positive ETCO2 and breath sounds checked- equal and bilateral Secured at: 23 cm Tube secured with: Tape Dental Injury: Teeth and Oropharynx as per pre-operative assessment

## 2011-11-29 NOTE — Progress Notes (Signed)
  Echocardiogram Echocardiogram Transesophageal has been performed.  Antonio [name] 11/29/2011, 8:55 AM

## 2011-11-29 NOTE — OR Nursing (Signed)
20 minute call performed at 1430 bu [address] Julien Girt [employer]

## 2011-11-29 NOTE — Preoperative (Signed)
Beta Blockers   Reason not to administer Beta Blockers:Not Applicable 

## 2011-11-29 NOTE — Brief Op Note (Signed)
11/13/2011 - 11/29/2011  11:42 AM  PATIENT:  Antonio [name].  61 y.o. male  PRE-OPERATIVE DIAGNOSIS:  Severe Aortic Stenosis  POST-OPERATIVE DIAGNOSIS:  Severe Aortic Stenosis  PROCEDURE:  Procedure(s): AORTIC VALVE REPLACEMENT (AVR) using a Magna Ease size 27 mm pericardial tissue valve  SURGEON:  Mikey Bussing, MD  PHYSICIAN ASSISTANT: Doree Fudge PA-C  ASSISTANTS: Minda Ditto RNFA  ANESTHESIA:   general  EBL:  Total I/O In: 2000 [I.V.:2000] Out: 250 [Urine:250]  BLOOD ADMINISTERED:1 pack PLTS  DRAINS: 2 Chest Tube(s) in the Pleural and Mediastinal spaces   SPECIMEN: Native aortic valve leaflets   COUNTS CORRECT:  YES  DICTATION: .Dragon Dictation  PLAN OF CARE: Admit to inpatient   PATIENT DISPOSITION:  ICU - intubated and hemodynamically stable.   Delay start of Pharmacological VTE agent (>24hrs) due to surgical blood loss or risk of bleeding:  YES  PRE OP WEIGHT 158 kg

## 2011-11-29 NOTE — Anesthesia Postprocedure Evaluation (Signed)
Anesthesia Post Note  Patient: Antonio [name].  Procedure(s) Performed:  AORTIC VALVE REPLACEMENT (AVR) - with nitric oxide  Anesthesia type: General  Patient location: ICU  Post pain: Pain level controlled  Post assessment: Post-op Vital signs reviewed  Last Vitals:  Filed Vitals:   11/29/11 1700  BP: 95/72  Pulse: 132  Temp: 37.5 C  Resp: 13    Post vital signs: stable  Level of consciousness: Patient remains intubated per anesthesia plan  Complications: No apparent anesthesia complications

## 2011-11-30 ENCOUNTER — Inpatient Hospital Stay (HOSPITAL_COMMUNITY): Payer: BC Managed Care – PPO

## 2011-11-30 ENCOUNTER — Other Ambulatory Visit: Payer: Self-pay

## 2011-11-30 LAB — POCT I-STAT 3, ART BLOOD GAS (G3+)
Acid-base deficit: 1 mmol/L (ref 0.0–2.0)
Bicarbonate: 20 mEq/L (ref 20.0–24.0)
O2 Saturation: 99 %
Patient temperature: 38.5
Patient temperature: 38.6
Patient temperature: 38.6
TCO2: 24 mmol/L (ref 0–100)
TCO2: 21 mmol/L (ref 0–100)
pCO2 arterial: 36.3 mmHg (ref 35.0–45.0)
pCO2 arterial: 34.9 mmHg — ABNORMAL LOW (ref 35.0–45.0)
pCO2 arterial: 34.2 mmHg — ABNORMAL LOW (ref 35.0–45.0)
pH, Arterial: 7.375 (ref 7.350–7.450)
pH, Arterial: 7.378 (ref 7.350–7.450)
pH, Arterial: 7.381 (ref 7.350–7.450)

## 2011-11-30 LAB — GLUCOSE, CAPILLARY
Glucose-Capillary: 112 mg/dL — ABNORMAL HIGH (ref 70–99)
Glucose-Capillary: 143 mg/dL — ABNORMAL HIGH (ref 70–99)
Glucose-Capillary: 85 mg/dL (ref 70–99)
Glucose-Capillary: 106 mg/dL — ABNORMAL HIGH (ref 70–99)
Glucose-Capillary: 111 mg/dL — ABNORMAL HIGH (ref 70–99)
Glucose-Capillary: 98 mg/dL (ref 70–99)
Glucose-Capillary: 86 mg/dL (ref 70–99)
Glucose-Capillary: 172 mg/dL — ABNORMAL HIGH (ref 70–99)

## 2011-11-30 LAB — CBC
HCT: 23.5 % — ABNORMAL LOW (ref 39.0–52.0)
Hemoglobin: 7.8 g/dL — ABNORMAL LOW (ref 13.0–17.0)
MCH: 27.7 pg (ref 26.0–34.0)
MCHC: 33.6 g/dL (ref 30.0–36.0)
MCHC: 33.2 g/dL (ref 30.0–36.0)
Platelets: 210 10*3/uL (ref 150–400)
RBC: 3.11 MIL/uL — ABNORMAL LOW (ref 4.22–5.81)
RDW: 14.3 % (ref 11.5–15.5)
WBC: 17.5 10*3/uL — ABNORMAL HIGH (ref 4.0–10.5)

## 2011-11-30 LAB — TYPE AND SCREEN
ABO/RH(D): AB POS
Antibody Screen: NEGATIVE
Unit division: 0
Unit division: 0
Unit division: 0
Unit division: 0

## 2011-11-30 LAB — PREPARE PLATELET PHERESIS: Unit division: 0

## 2011-11-30 LAB — CREATININE, SERUM
Creatinine, Ser: 0.8 mg/dL (ref 0.50–1.35)
GFR calc Af Amer: >90 mL/min (ref >90)
GFR calc non Af Amer: >90 mL/min (ref >90)

## 2011-11-30 LAB — PREPARE FRESH FROZEN PLASMA
Unit division: 0
Unit division: 0

## 2011-11-30 LAB — BASIC METABOLIC PANEL
CO2: 23 mEq/L (ref 19–32)
Calcium: 8.3 mg/dL — ABNORMAL LOW (ref 8.4–10.5)
Creatinine, Ser: 0.74 mg/dL (ref 0.50–1.35)
GFR calc Af Amer: >90 mL/min (ref >90)
GFR calc non Af Amer: >90 mL/min (ref >90)
Sodium: 139 mEq/L (ref 135–145)

## 2011-11-30 LAB — CARBOXYHEMOGLOBIN
Carboxyhemoglobin: 1 % (ref 0.5–1.5)
Carboxyhemoglobin: 1.1 % (ref 0.5–1.5)
Carboxyhemoglobin: 1.2 % (ref 0.5–1.5)
Carboxyhemoglobin: 1.3 % (ref 0.5–1.5)
Methemoglobin: 1 % (ref 0.0–1.5)
Methemoglobin: 0.9 % (ref 0.0–1.5)
Methemoglobin: 0.9 % (ref 0.0–1.5)
Methemoglobin: 1.1 % (ref 0.0–1.5)
O2 Saturation: 64.1 %
O2 Saturation: 64 %
O2 Saturation: 60.7 %
O2 Saturation: 51.2 %
Total hemoglobin: 8 g/dL — ABNORMAL LOW (ref 13.5–18.0)
Total hemoglobin: 7.9 g/dL — ABNORMAL LOW (ref 13.5–18.0)
Total hemoglobin: 7.4 g/dL — ABNORMAL LOW (ref 13.5–18.0)
Total hemoglobin: 7.6 g/dL — ABNORMAL LOW (ref 13.5–18.0)

## 2011-11-30 LAB — MAGNESIUM
Magnesium: 2 mg/dL (ref 1.5–2.5)
Magnesium: 1.9 mg/dL (ref 1.5–2.5)

## 2011-11-30 LAB — POCT I-STAT, CHEM 8
Calcium, Ion: 1.21 mmol/L (ref 1.12–1.32)
HCT: 23 % — ABNORMAL LOW (ref 39.0–52.0)
Hemoglobin: 7.8 g/dL — ABNORMAL LOW (ref 13.0–17.0)
Sodium: 139 mEq/L (ref 135–145)
TCO2: 21 mmol/L (ref 0–100)

## 2011-11-30 LAB — PREPARE RBC (CROSSMATCH)

## 2011-11-30 MED ORDER — INSULIN ASPART 100 UNIT/ML ~~LOC~~ SOLN: 0.0000 [IU] | SUBCUTANEOUS | Status: DC

## 2011-11-30 MED ORDER — MILRINONE IN DEXTROSE 200-5 MCG/ML-% IV SOLN
0.1250 ug/kg/min | INTRAVENOUS | Status: DC
  Administered 2011-11-30 – 2011-12-01 (×4): 0.3 ug/kg/min via INTRAVENOUS
  Filled 2011-11-30 (×4): qty 100

## 2011-11-30 MED ORDER — INSULIN GLARGINE 100 UNIT/ML ~~LOC~~ SOLN
20.0000 [IU] | Freq: Two times a day (BID) | SUBCUTANEOUS | Status: AC
  Administered 2011-11-30 – 2011-12-02 (×6): 20 [IU] via SUBCUTANEOUS
  Filled 2011-11-30: qty 3

## 2011-11-30 MED ORDER — BIOTENE DRY MOUTH MT LIQD
15.0000 mL | Freq: Four times a day (QID) | OROMUCOSAL | Status: DC
  Administered 2011-11-30 – 2011-12-01 (×3): 15 mL via OROMUCOSAL

## 2011-11-30 MED ORDER — METHYLPREDNISOLONE SODIUM SUCC 125 MG IJ SOLR
80.0000 mg | Freq: Once | INTRAMUSCULAR | Status: AC
  Administered 2011-11-30: 80 mg via INTRAVENOUS
  Filled 2011-11-30: qty 1.28

## 2011-11-30 MED ORDER — CHLORHEXIDINE GLUCONATE 0.12 % MT SOLN
15.0000 mL | Freq: Two times a day (BID) | OROMUCOSAL | Status: DC
  Administered 2011-11-30 – 2011-12-01 (×2): 15 mL via OROMUCOSAL
  Filled 2011-11-30 (×2): qty 15

## 2011-11-30 MED ORDER — FUROSEMIDE 10 MG/ML IJ SOLN
40.0000 mg | Freq: Once | INTRAMUSCULAR | Status: AC
  Administered 2011-11-30: 40 mg via INTRAVENOUS
  Filled 2011-11-30: qty 4

## 2011-11-30 MED ORDER — MILRINONE IN DEXTROSE 200-5 MCG/ML-% IV SOLN
0.2500 ug/kg/min | INTRAVENOUS | Status: DC
  Filled 2011-11-30: qty 100

## 2011-11-30 MED ORDER — POTASSIUM CHLORIDE 10 MEQ/50ML IV SOLN
INTRAVENOUS | Status: AC
  Administered 2011-11-30: 10 meq via INTRAVENOUS
  Filled 2011-11-30: qty 150

## 2011-11-30 MED ORDER — INSULIN ASPART 100 UNIT/ML ~~LOC~~ SOLN
0.0000 [IU] | SUBCUTANEOUS | Status: DC
  Administered 2011-11-30: 4 [IU] via SUBCUTANEOUS
  Administered 2011-11-30: 2 [IU] via SUBCUTANEOUS
  Administered 2011-11-30: 4 [IU] via SUBCUTANEOUS
  Administered 2011-12-01: 8 [IU] via SUBCUTANEOUS

## 2011-11-30 MED ORDER — SODIUM BICARBONATE 8.4 % IV SOLN
50.0000 meq | Freq: Once | INTRAVENOUS | Status: AC
  Administered 2011-11-30: 50 meq via INTRAVENOUS
  Filled 2011-11-30: qty 50

## 2011-11-30 MED ORDER — POTASSIUM CHLORIDE 10 MEQ/50ML IV SOLN
10.0000 meq | INTRAVENOUS | Status: AC
  Administered 2011-11-30 (×3): 10 meq via INTRAVENOUS

## 2011-11-30 MED FILL — Verapamil HCl IV Soln 2.5 MG/ML: INTRAVENOUS | Qty: 4 | Status: AC

## 2011-11-30 MED FILL — Nitroglycerin IV Soln 5 MG/ML: INTRAVENOUS | Qty: 10 | Status: AC

## 2011-11-30 MED FILL — Lactated Ringer's Solution: INTRAVENOUS | Qty: 500 | Status: AC

## 2011-11-30 MED FILL — Heparin Sodium (Porcine) Inj 1000 Unit/ML: INTRAMUSCULAR | Qty: 10 | Status: AC

## 2011-11-30 MED FILL — Magnesium Sulfate Inj 50%: INTRAMUSCULAR | Qty: 10 | Status: AC

## 2011-11-30 MED FILL — Potassium Chloride Inj 2 mEq/ML: INTRAVENOUS | Qty: 40 | Status: AC

## 2011-11-30 NOTE — Op Note (Signed)
Antonio [name], Antonio [name] NO.:  [id number]  MEDICAL RECORD NO.:  [id number]  LOCATION:  2305                         FACILITY:  [facility]  PHYSICIAN:  Kerin Perna, M.D.  DATE OF BIRTH:  1951/02/25  DATE OF PROCEDURE:  11/29/2011 DATE OF DISCHARGE:                              OPERATIVE REPORT   OPERATION:  Aortic valve replacement with a 27-mm pericardial [facility] Ease valve (model 3300TFX, serial number [id number]).  PREOPERATIVE DIAGNOSIS:  Severe aortic stenosis with class 4 congestive heart failure.  POSTOPERATIVE DIAGNOSIS:  Severe aortic stenosis with class 4 congestive heart failure.  SURGEON:  Kerin Perna, MD  ASSISTANT:  Doree Fudge, PA-C  ANESTHESIA:  General by Dr. Claybon Jabs.  INDICATIONS:  The patient is a 61 year old Caucasian male, diabetic, morbidly obese with a BMI greater than 50 who presented with CHF, CO2 retention from COPD and sleep apnea, and renal insufficiency and gout. He had undergone cardiac cath approximately 12 months ago that showed no significant coronary artery disease.  An echo on this admission showed progression of his aortic stenosis to severe with a calculated valve area of 0.9-1.0 with a significant transvalvular gradient over 40 mmHg. LVEF was mildly reduced and there was no significant mitral regurgitation.  After he was treated for his multiple comorbid medical problems, he was felt to be a candidate for aortic valve replacement. Prior to surgery, I discussed the procedure with the patient and his wife and family including indications, benefits, alternatives, and risks.  He understood the risks of bleeding, blood transfusion requirement, infection, ventilator dependence, stroke, and death.  He understood that he would be at increased risk for pulmonary complications due to his morbid obesity and chronic lung disease.  He agreed to proceed with surgery under what I felt was an  informed consent.  FINDINGS:  Aortic valve is trileaflet.  It had severe calcification.  It was replaced with a pericardial valve that showed normal function by postprocedure transesophageal echo.  The patient's aorta was somewhat thin and a tear and bleeding point beneath the left side of the aortotomy required a second period of cardiopulmonary bypass and cardioplegic arrest for Teflon pledgeted suture repair.  PROCEDURE:  The patient was brought to the operative room and placed supine on the operative table where general anesthesia was induced.  The chest, abdomen, and legs were prepped with Betadine and draped as a sterile field.  A transesophageal echo probe was placed by the anesthesiologist.  A sternal incision was made.  The pericardium was opened and suspended.  The deep blades were used on the retractor due to the patient's body habitus.  Heparin was administered and pursestrings were placed in the ascending aorta and right atrium.  The patient was cannulated and placed on cardiopulmonary bypass.  A left ventricular vent was placed via the right superior pulmonary vein.  Cardioplegia cannulas were placed for both antegrade and retrograde cardioplegia and the patient was cooled to 32 degrees.  Aortic cross clamp was applied. 850 mL of cold blood cardioplegia was delivered in split doses between the antegrade aortic and retrograde coronary sinus catheters.  There was good cardioplegic arrest and septal  temperature dropped less than 15 degrees.  Cardioplegia was delivered every 20 minutes.  An aortotomy was made above the sinotubular junction.  The aortic valve was inspected.  It was trileaflet.  It had heavy calcification especially along the anulus extending up into the leaflets.  This was all excised.  The outflow tract was irrigated.  It was sized to a 27-mm pericardial valve.  Subannular 2-0 pledgeted Ethibond sutures were placed around the anulus numbering 18 total.   The valve was prepared according to protocol.  The sutures were placed through the sewing ring and the valve was seated and sutures tied.  There was good confirmation of the valve to the anulus.  There was no obstruction of the coronary ostia.  There was no evidence of a space between the anulus and the prosthesis for potential leak.  The aortotomy was closed with a running 4-0 Prolene in 2 layers.  The patient was rewarmed and air was evacuated from the coronaries with a dose of retrograde warm blood cardioplegia. The crossclamp was removed.  The heart resumed a spontaneous rhythm.  The patient was rewarmed and temporary pacing wires were applied.  The patient was then weaned off bypass on low-dose dopamine and nitric oxide due to his elevated PA pressures from sleep apnea.  Protamine was administered.  The patient was stable.  The echo showed good valvular function.  The blood pressure increased and there was bleeding from the aorta beneath the aortotomy incision on the left lateral aspect of the ascending aorta.  This was controlled with digital pressure.  Heparin was readministered.  The venous cannula was replaced.  The patient was then cooled and the crossclamp was applied.  Cardioplegia was then again delivered antegrade and retrograde.  There were new cardioplegia catheters which had been inserted.  The defect was beneath the aortotomy.  Interrupted pledgeted 4-0 Prolene was placed along this area and then a second running 4-0 Prolene suture was placed over the interrupted sutures.  This secured the bleeding point and was covered with a light coating of biologic glue.  The patient was rewarmed and air was vented with a dose of retrograde warm blood cardioplegia.  The crossclamp was then removed.  The aortic repair site was intact and there was no significant bleeding.  Just diffuse bleeding was noted.  The patient was then weaned off bypass again on the same medication and  remained hemodynamically stable.  The echo showed good LV function with good valvular function.  There was some diffuse bleeding and the patient was given platelets.  An anterior mediastinal chest tube and posterior mediastinal chest tubes were placed.  The sternum was closed with wire.  The pectoralis fascia was closed with #1 Vicryl.  The subcutaneous and skin layers were closed in running Vicryl and sterile dressings were applied.  Initial cardiopulmonary bypass time was 130 minutes with an extra 60 minutes for repair of the aortic bleeding point.     Kerin Perna, M.D.     PV/MEDQ  D:  11/30/2011  T:  11/30/2011  Job:  [id number]  cc:   Verne Carrow, MD

## 2011-11-30 NOTE — Progress Notes (Signed)
CRITICAL VALUE ALERT  Critical value received: total hemoglobin from carboxyhemoglobin draw 7.9  Date of notification:  11/30/11   Time of notification:  0845  Critical value read back:yes  Nurse who received alert:  Larina Bras  MD notified (1st page):  Dr. Donata Clay   Time of first page:  0845  MD notified (2nd page):  Time of second page:  Responding MD:    Time MD responded:

## 2011-11-30 NOTE — Progress Notes (Signed)
Physical Therapy Cancellation Note: order received, chart reviewed, and pt still intubated will await extubation for evaluation. Will attempt 2/2. Thanks Toney Sang, PT [phone]

## 2011-11-30 NOTE — Progress Notes (Signed)
CSW received referral for pt needing SNF placement. CSW completed psychosocial assessment (located in shadow chart). Pt currently intubated. CSW will initiate SNF search once PT has evaluated and will continue to follow to facilitate d/c pt as pt progresses medically.  Baxter Flattery, MSW [phone]

## 2011-11-30 NOTE — Progress Notes (Addendum)
POD #1 S/p AVR  Remains intubated and sedated. FiO2 % 50  Vital Signs: Tmax 101.5, HR 108, RR 18, BP 113/65; A line 98/59, O2 sat 99% Chest tubes with approximately 200 cc last 12 hours  Labs:  H/H 7.8/23 Potassium 3.9 Bun/Cr 12/0.9  Physical Exam: Cardiovascular:slightly tachycardic Pulmonary:Slightly decreased at bases. Wound:Dressing clean and dry.   Impression and Plan: ABL anemia-transfusion ordered. Continue Nitric Oxide Monitor fever-CXR ordered. If continues, check blood cultures and UA. Hopefully, extubate in am Continue routine post op care.

## 2011-11-30 NOTE — Progress Notes (Signed)
   CARE MANAGEMENT NOTE 11/30/2011  Patient:  ERLIN, GARDELLA   Account Number:  [id number]  Date Initiated:  11/30/2011  Documentation initiated by:  [facility]  Subjective/Objective Assessment:   Post op Valve on 11-29-11 - has spouse - but planning for SNF     Action/Plan:   PTA, PT INDEPENDENT, LIVES WITH SPOUSE WHO WORKS.   Anticipated DC Date:  12/04/2011   Anticipated DC Plan:  SKILLED NURSING FACILITY  In-house referral  Clinical Social Worker      DC Planning Services  CM consult      Choice offered to / List presented to:             Status of service:  In process, will continue to follow Medicare Important Message given?   (If response is "NO", the following Medicare IM given date fields will be blank) Date Medicare IM given:   Date Additional Medicare IM given:    Discharge Disposition:    Per UR Regulation:  Reviewed for med. necessity/level of care/duration of stay  Comments:  11/30/11  Tavaughn Silguero,RN,BSN 1510 PT WILL NEED SHORT TERM SNF AT DISCHARGE.  WILL CONSULT CSW TO FACILITATE DC TO SNF WHEN MEDICALLY STABLE. Phone #[phone]

## 2011-11-30 NOTE — Progress Notes (Signed)
UR Completed.  Antonio [name] [phone] 11/30/2011  

## 2011-11-30 NOTE — Progress Notes (Signed)
SUBJECTIVE: Pt intubated. Awake.   BP 99/67  Pulse 95  Temp(Src) 101.1 F (38.4 C) (Core (Comment))  Resp 12  Ht 6\' 1"  (1.854 m)  Wt 375 lb 10.6 oz (170.4 kg)  BMI 49.56 kg/m2  SpO2 100%  Intake/Output Summary (Last 24 hours) at 11/30/11 1610 Last data filed at 11/30/11 0700  Gross per 24 hour  Intake 12723.31 ml  Output   3850 ml  Net 8873.31 ml    PHYSICAL EXAM General: Well developed, well nourished, in no acute distress. Alert and oriented x 3.  Psych:  Good affect, responds appropriately Neck: No JVD. No masses noted.  Lungs: Clear bilaterally with no wheezes or rhonci noted.  Heart: RRR with no murmurs noted. Abdomen: Bowel sounds are present. Soft, non-tender.  Extremities: No lower extremity edema.   LABS: Basic Metabolic Panel:  Basename 11/30/11 0345 11/29/11 2057 11/29/11 2044 11/28/11 0530  NA 139 141 -- --  K 3.7 4.3 -- --  CL 107 107 -- --  CO2 23 -- -- 27  GLUCOSE 104* 145* -- --  BUN 14 14 -- --  CREATININE 0.74 1.00 -- --  CALCIUM 8.3* -- -- 9.4  MG 2.0 -- 2.3 --  PHOS -- -- -- --   CBC:  Basename 11/30/11 0345 11/29/11 2057 11/29/11 2044  WBC 13.0* -- 13.2*  NEUTROABS -- -- --  HGB 8.6* 8.2* --  HCT 25.6* 24.0* --  MCV 82.3 -- 82.7  PLT 210 -- 213    Current Meds:    . acetaminophen (TYLENOL) oral liquid 160 mg/5 mL  650 mg Per Tube NOW   Or  . acetaminophen  650 mg Rectal NOW  . acetaminophen  1,000 mg Oral Q6H   Or  . acetaminophen (TYLENOL) oral liquid 160 mg/5 mL  975 mg Per Tube Q6H  . aminocaproic acid (AMICAR) for OHS   Intravenous To OR  . amiodarone  150 mg Intravenous Once  . bisacodyl  10 mg Oral Daily   Or  . bisacodyl  10 mg Rectal Daily  . calcium chloride  1 g Intravenous Once  . cefUROXime (ZINACEF)  IV  1.5 g Intravenous To OR  . cefUROXime (ZINACEF)  IV  1.5 g Intravenous Q12H  . chlorhexidine      . citalopram  20 mg Oral Daily  . colchicine  0.6 mg Oral BID  . desmopressin (DDAVP) IV  20 mcg Intravenous  Once  . dexmedetomidine (PRECEDEX) IV infusion  0.4-1.2 mcg/kg/hr Intravenous Once  . digoxin  0.25 mg Intravenous Daily  . docusate sodium  200 mg Oral Daily  . famotidine (PEPCID) IV  20 mg Intravenous Q12H  . fenofibrate  160 mg Oral Daily  . insulin aspart  0-24 Units Subcutaneous Q4H  . insulin glargine  20 Units Subcutaneous BID  . insulin (NOVOLIN-R) infusion   Intravenous To OR  . insulin regular  0-10 Units Intravenous TID WC  . magnesium sulfate  4 g Intravenous Once  . metoprolol tartrate  12.5 mg Oral BID   Or  . metoprolol tartrate  12.5 mg Per Tube BID  . nitroGLYCERIN  2-200 mcg/min Intravenous To OR  . pantoprazole  40 mg Oral Q1200  . potassium chloride  10 mEq Intravenous Q1 Hr x 3  . potassium chloride  10 mEq Intravenous Q1 Hr x 3  . rosuvastatin  20 mg Oral q1800  . sodium bicarbonate      . sodium bicarbonate  25 mEq Intravenous  Once  . sodium chloride  3 mL Intravenous Q12H  . vancomycin (VANCOCIN) IVPB 1000 mg/100 mL central line  1,000 mg Intravenous Once  . DISCONTD: aminocaproic acid (AMICAR) IVPB  5 g Intravenous To OR  . DISCONTD: aspirin EC  81 mg Oral Daily  . DISCONTD: carvedilol  12.5 mg Oral BID WC  . DISCONTD: cefUROXime (ZINACEF)  IV  750 mg Intravenous To OR  . DISCONTD: colchicine  0.6 mg Oral BID  . DISCONTD: DOPamine  2-20 mcg/kg/min Intravenous To OR  . DISCONTD: epinephrine  0.5-20 mcg/min Intravenous To OR  . DISCONTD: exenatide  5 mcg Subcutaneous BID WC  . DISCONTD: furosemide  40 mg Oral BID  . DISCONTD: glimepiride  4 mg Oral BID WC  . DISCONTD: insulin aspart  0-15 Units Subcutaneous TID WC  . DISCONTD: insulin aspart  0-24 Units Subcutaneous Q2H  . DISCONTD: insulin aspart  0-24 Units Subcutaneous Q4H  . DISCONTD: insulin glargine  15 Units Subcutaneous QHS  . DISCONTD: magnesium sulfate  40 mEq Other To OR  . DISCONTD: nitroglycerin/verapamil/heparin/sodium bicarbonate solution irrigation for artery spasm   Irrigation To OR  .  DISCONTD: phenylephrine (NEO-SYNEPHRINE) Adult infusion  30-200 mcg/min Intravenous To OR  . DISCONTD: potassium chloride  80 mEq Other To OR  . DISCONTD: potassium chloride  40 mEq Oral Daily     ASSESSMENT AND PLAN: 1. Acute on chronic combined systolic and diastolic heart failure: His heart failure has resolved before his surgical procedure. Would aggressively diurese post-op with large volume given during his procedure. Lisinopril has been stopped during this hospitalization secondary to renal insufficiency in setting of severe AS.   2. Severe aortic valve stenosis: s/p AVR with bioprosthetic valve yesterday. Doing well post-op. Hemodynamically stable.   3. Acute Respiratory Failure/Obesity Hypoventilation syndrome/OSA: Will need CPAP after he his extubated.   4. DM: Continue current therapy.   5. Acute renal failure: Resolved. Secondary to over-diuresis last week.   6. Gout: Improved.   7. Depression: Celexa 20 mg po Qdaily started 11/20/11.    Elga Santy  2/1/20138:14 AM

## 2011-12-01 ENCOUNTER — Inpatient Hospital Stay (HOSPITAL_COMMUNITY): Payer: BC Managed Care – PPO

## 2011-12-01 LAB — TYPE AND SCREEN
ABO/RH(D): AB POS
Antibody Screen: NEGATIVE
Unit division: 0

## 2011-12-01 LAB — POCT I-STAT 3, ART BLOOD GAS (G3+)
O2 Saturation: 96 %
O2 Saturation: 92 %
TCO2: 26 mmol/L (ref 0–100)
pCO2 arterial: 33 mmHg — ABNORMAL LOW (ref 35.0–45.0)
pH, Arterial: 7.422 (ref 7.350–7.450)

## 2011-12-01 LAB — GLUCOSE, CAPILLARY
Glucose-Capillary: 132 mg/dL — ABNORMAL HIGH (ref 70–99)
Glucose-Capillary: 211 mg/dL — ABNORMAL HIGH (ref 70–99)
Glucose-Capillary: 226 mg/dL — ABNORMAL HIGH (ref 70–99)
Glucose-Capillary: 227 mg/dL — ABNORMAL HIGH (ref 70–99)
Glucose-Capillary: 253 mg/dL — ABNORMAL HIGH (ref 70–99)
Glucose-Capillary: 281 mg/dL — ABNORMAL HIGH (ref 70–99)
Glucose-Capillary: 312 mg/dL — ABNORMAL HIGH (ref 70–99)

## 2011-12-01 LAB — CBC
HCT: 24 % — ABNORMAL LOW (ref 39.0–52.0)
Hemoglobin: 8.1 g/dL — ABNORMAL LOW (ref 13.0–17.0)
MCH: 27.6 pg (ref 26.0–34.0)
MCHC: 33.8 g/dL (ref 30.0–36.0)
MCV: 81.6 fL (ref 78.0–100.0)
Platelets: 192 10*3/uL (ref 150–400)
RBC: 2.94 MIL/uL — ABNORMAL LOW (ref 4.22–5.81)
RDW: 14.3 % (ref 11.5–15.5)
WBC: 15.4 10*3/uL — ABNORMAL HIGH (ref 4.0–10.5)

## 2011-12-01 LAB — BASIC METABOLIC PANEL
BUN: 16 mg/dL (ref 6–23)
CO2: 24 mEq/L (ref 19–32)
Calcium: 8.6 mg/dL (ref 8.4–10.5)
Chloride: 101 mEq/L (ref 96–112)
Creatinine, Ser: 0.87 mg/dL (ref 0.50–1.35)
GFR calc Af Amer: >90 mL/min (ref >90)
GFR calc non Af Amer: >90 mL/min (ref >90)
Glucose, Bld: 304 mg/dL — ABNORMAL HIGH (ref 70–99)
Potassium: 3.8 mEq/L (ref 3.5–5.1)
Sodium: 135 mEq/L (ref 135–145)

## 2011-12-01 MED ORDER — FUROSEMIDE 10 MG/ML IJ SOLN
40.0000 mg | Freq: Two times a day (BID) | INTRAMUSCULAR | Status: DC
  Administered 2011-12-01 (×2): 40 mg via INTRAVENOUS
  Filled 2011-12-01 (×5): qty 4

## 2011-12-01 MED ORDER — INSULIN ASPART 100 UNIT/ML ~~LOC~~ SOLN
0.0000 [IU] | SUBCUTANEOUS | Status: DC
  Administered 2011-12-02: 4 [IU] via SUBCUTANEOUS
  Administered 2011-12-02: 2 [IU] via SUBCUTANEOUS
  Administered 2011-12-02: 4 [IU] via SUBCUTANEOUS
  Administered 2011-12-02 – 2011-12-05 (×4): 2 [IU] via SUBCUTANEOUS
  Filled 2011-12-01: qty 3

## 2011-12-01 MED ORDER — FUROSEMIDE 10 MG/ML IJ SOLN
40.0000 mg | Freq: Once | INTRAMUSCULAR | Status: AC
  Administered 2011-12-01: 40 mg via INTRAVENOUS
  Filled 2011-12-01: qty 4

## 2011-12-01 MED ORDER — INSULIN ASPART 100 UNIT/ML ~~LOC~~ SOLN
0.0000 [IU] | SUBCUTANEOUS | Status: DC
  Administered 2011-12-01 (×2): 4 [IU] via SUBCUTANEOUS
  Filled 2011-12-01: qty 3

## 2011-12-01 MED ORDER — INSULIN GLARGINE 100 UNIT/ML ~~LOC~~ SOLN: 20.0000 [IU] | SUBCUTANEOUS | Status: DC

## 2011-12-01 NOTE — Evaluation (Addendum)
Physical Therapy Evaluation Patient Details Name: Antonio [name]. MRN: [id number] DOB: Dec 21, 1950 Today's Date: 12/01/2011  Problem List:  Patient Active Problem List  Diagnoses  . DIABETES MELLITUS, TYPE II, UNCONTROLLED  . HYPERLIPIDEMIA  . Gout, unspecified  . OBESITY, NOS  . HYPERTENSION, BENIGN SYSTEMIC  . AORTIC STENOSIS  . EDEMA-LEGS,DUE TO VENOUS OBSTRUCT.  . OSTEOARTHRITIS OF SPINE, NOS  . BACK PAIN, LUMBAR  . CARDIOMYOPATHY, SECONDARY  . DYSPNEA  . ABNORMAL CV (STRESS) TEST  . Acute on chronic systolic congestive heart failure  . Sleep apnea  . Systolic and diastolic CHF, acute on chronic    Past Medical History:  Past Medical History  Diagnosis Date  . Hepatitis   . Chronic systolic heart failure     echo 12/11: EF 45-50%, grade 1 diast dysfxn, moderately severe AS (AVA 0.9, mean 31 mmHg), mild MR;    cath 1/12:  normal cors, AVA 1.0, peak to peak gradiet for AV 31 mmHg, mod-severe AS, EF 35-40%  . 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 Hx MRSA infection     thigh abcess 11/06  . Anxiety   . Aortic stenosis     echo 12/11: AVA 0.9, mean 31, cath 1/12: AVA 1.0 with peak to peak 31 mmHg  . HTN (hypertension)   . DM2 (diabetes mellitus, type 2)   . HLD (hyperlipidemia)   . Gout   . NICM (nonischemic cardiomyopathy)    Past Surgical History:  Past Surgical History  Procedure Date  . Transthoracic echocardiogram 12/05    EF: 55-65% mild to moderate AS  . Cardiovascular stress test 10/10/04    ? inf. wall defect -  . Laminectomy 10/30/83    PT Assessment/Plan/Recommendation PT Assessment Clinical Impression Statement: Patient is a 61 yo male admitted with heart failure, with a complicated hospital course.  Patient now s/p AV replacement  on 11/29/11.  Patient with overall weakness due to prolonged hospital stay and surgery, impacting functional mobility.  Recommend ST-SNF for continued therapy at discharge with goal of independent mobility to return  home. PT Recommendation/Assessment: Patient will need skilled PT in the acute care venue PT Problem List: Decreased strength;Decreased activity tolerance;Decreased balance;Decreased mobility;Decreased knowledge of use of DME;Decreased knowledge of precautions;Cardiopulmonary status limiting activity PT Therapy Diagnosis : Difficulty walking;Generalized weakness PT Plan PT Frequency: Min 3X/week PT Treatment/Interventions: DME instruction;Gait training;Functional mobility training;Therapeutic exercise;Balance training;Patient/family education PT Recommendation Follow Up Recommendations: Skilled nursing facility Equipment Recommended: Defer to next venue PT Goals  Acute Rehab PT Goals PT Goal Formulation: With patient Time For Goal Achievement: 2 weeks Pt will go Supine/Side to Sit: with min assist;with HOB 0 degrees PT Goal: Supine/Side to Sit - Progress: Goal set today Pt will go Sit to Supine/Side: with min assist;with HOB 0 degrees PT Goal: Sit to Supine/Side - Progress: Goal set today Pt will go Sit to Stand: with supervision;without upper extremity assist PT Goal: Sit to Stand - Progress: Goal set today Pt will go Stand to Sit: with supervision;without upper extremity assist PT Goal: Stand to Sit - Progress: Goal set today Pt will Transfer Bed to Chair/Chair to Bed: with supervision PT Transfer Goal: Bed to Chair/Chair to Bed - Progress: Goal set today Pt will Ambulate: >150 feet;with supervision;with least restrictive assistive device PT Goal: Ambulate - Progress: Goal set today Additional Goals Additional Goal #1: Patient will have good understanding of and adhere to sternal precautions. PT Goal: Additional Goal #1 -  Progress: Goal set today  PT Evaluation Precautions/Restrictions  Precautions Precautions: Fall, Sternal precautions Required Braces or Orthoses: No Restrictions Weight Bearing Restrictions: No Prior Functioning  Home Living Lives With: Spouse Type of Home:  House Home Layout: Able to live on main level with bedroom/bathroom;Two level Alternate Level Stairs-Rails: Right Alternate Level Stairs-Number of Steps: 4 Home Access: Level entry Bathroom Shower/Tub: Health visitor: Standard Home Adaptive Equipment: Straight cane;Walker - rolling Prior Function Level of Independence: Independent with basic ADLs;Independent with gait Driving: Yes Cognition Cognition Arousal/Alertness: Awake/alert Overall Cognitive Status: Appears within functional limits for tasks assessed Orientation Level: Oriented X4 Sensation/Coordination Sensation Light Touch: Appears Intact Coordination Gross Motor Movements are Fluid and Coordinated: No Coordination and Movement Description: Decreased coordination of movement due to weakness Extremity Assessment RUE Assessment RUE Assessment: Not tested (Due to sternal precautions) LUE Assessment LUE Assessment: Not tested (Due to sternal precautions) RLE Assessment RLE Assessment:  (Grossly 4-/5) LLE Assessment LLE Assessment:  (Grossly 4-/5) Mobility (including Balance) Bed Mobility Bed Mobility: Yes Supine to Sit: 1: +2 Total assist;HOB elevated (Comment degrees);Patient percentage (comment) (HOB at 45 degrees, Patient = 60%) Supine to Sit Details (indicate cue type and reason): Reviewed with patient sternal precautions.  Instructions on transition to sitting provided. Sitting - Scoot to Edge of Bed: 3: Mod assist Sitting - Scoot to Edge of Bed Details (indicate cue type and reason): Instructed patient to scoot one hip forward at a time Transfers Transfers: Yes Sit to Stand: 1: +2 Total assist;Patient percentage (comment);Without upper extremity assist;From bed (Patient = 70%) Sit to Stand Details (indicate cue type and reason): Cues to stand without pushing on UE's.  Assist required to initiate standing, and to support bil knees to maintain extension in stance. Stand to Sit: 1: +2 Total  assist;Patient percentage (comment);To chair/3-in-1 (Patient = 70%) Stand to Sit Details: Cues for technique to maintain sternal precautions.  Assist to control descent into chair Stand Pivot Transfers: 1: +2 Total assist;Patient percentage (comment) (Patient = 70%) Stand Pivot Transfer Details (indicate cue type and reason): Cues for technique.  Support provided to maintain knee extension during stance. Ambulation/Gait Ambulation/Gait: No    Exercise    End of Session PT - End of Session Activity Tolerance: Patient limited by fatigue Patient left: in chair;with call bell in reach Nurse Communication: Mobility status for transfers General Behavior During Session: [facility] for tasks performed Cognition: [facility] for tasks performed  Vena Austria [phone] 12/01/2011, 6:39 PM

## 2011-12-01 NOTE — Procedures (Signed)
Extubation Procedure Note  Patient Details:   Name: Antonio [name]. DOB: 13-Nov-1950 MRN: [id number]   Airway Documentation:  Pt is now extubated to 4L [place] at 0935. No stridor noted. NIF -25, VC .7L . Pt able to vocalize.  Evaluation  O2 sats: stable throughout and currently acceptable Complications: No apparent complications Patient did tolerate procedure well. Bilateral Breath Sounds: Diminished Suctioning: Oral   Christie Beckers 12/01/2011, 9:39 AM

## 2011-12-01 NOTE — Progress Notes (Signed)
Patient ID: Antonio Smothers., male   DOB: [birth year], 61 y.o.   MRN: [id number] TCTS DAILY PROGRESS NOTE                   [address]            [place] [address]          [phone]      2 Days Post-Op Procedure(s) (LRB): AORTIC VALVE REPLACEMENT (AVR) (N/A)  Total Length of Stay:  LOS: 18 days   Subjective: Now extubated, neuro intact  Objective: Vital signs in last 24 hours: Temp:  [99.9 F (37.7 C)-102 F (38.9 C)] 99.9 F (37.7 C) (02/02 1000) Pulse Rate:  [94-117] 109  (02/02 1000) Cardiac Rhythm:  [-] Sinus tachycardia (02/02 1000) Resp:  [0-28] 21  (02/02 1000) BP: (89-139)/(47-83) 94/55 mmHg (02/02 1000) SpO2:  [87 %-100 %] 94 % (02/02 1000) Arterial Line BP: (88-158)/(53-78) 96/59 mmHg (02/02 1000) FiO2 (%):  [0 %-50.7 %] 0 % (02/02 0936)  Filed Weights   11/28/11 0545 11/29/11 0530 11/30/11 0330  Weight: 352 lb 4.7 oz (159.8 kg) 349 lb 6.9 oz (158.5 kg) 375 lb 10.6 oz (170.4 kg)    Weight change:    Hemodynamic parameters for last 24 hours: PAP: (28-49)/(17-31) 40/22 mmHg CVP:  [11 mmHg-14 mmHg] 14 mmHg CO:  [6.6 L/min-9.9 L/min] 9.9 L/min CI:  [2.4 L/min/m2-3.6 L/min/m2] 3.6 L/min/m2  Intake/Output from previous day: 02/01 0701 - 02/02 0700 In: 4153.3 [I.V.:3439.3; Blood:350; NG/GT:210; IV Piggyback:154] Out: 4085 [Urine:2985; Emesis/NG output:950; Chest Tube:150]  Intake/Output this shift: Total I/O In: 458.3 [I.V.:378.3; NG/GT:30; IV Piggyback:50] Out: 140 [Urine:140]  Current Meds: Scheduled Meds:   . acetaminophen  1,000 mg Oral Q6H   Or  . acetaminophen (TYLENOL) oral liquid 160 mg/5 mL  975 mg Per Tube Q6H  . antiseptic oral rinse  15 mL Mouth Rinse QID  . bisacodyl  10 mg Oral Daily   Or  . bisacodyl  10 mg Rectal Daily  . cefUROXime (ZINACEF)  IV  1.5 g Intravenous Q12H  . chlorhexidine  15 mL Mouth/Throat BID  . citalopram  20 mg Oral Daily  . colchicine  0.6 mg Oral BID  . digoxin  0.25 mg Intravenous Daily   . docusate sodium  200 mg Oral Daily  . fenofibrate  160 mg Oral Daily  . furosemide  40 mg Intravenous Once  . insulin aspart  0-24 Units Subcutaneous Q4H  . insulin glargine  20 Units Subcutaneous BID  . insulin regular  0-10 Units Intravenous TID WC  . methylPREDNISolone (SOLU-MEDROL) injection  80 mg Intravenous Once  . metoprolol tartrate  12.5 mg Oral BID   Or  . metoprolol tartrate  12.5 mg Per Tube BID  . pantoprazole  40 mg Oral Q1200  . rosuvastatin  20 mg Oral q1800  . sodium bicarbonate  50 mEq Intravenous Once  . sodium chloride  3 mL Intravenous Q12H   Continuous Infusions:   . sodium chloride 20 mL/hr at 11/29/11 1515  . sodium chloride 10 mL/hr at 11/30/11 0200  . sodium chloride    . amiodarone (NEXTERONE PREMIX) 360 mg/200 mL dextrose 0.5 mg/min (12/01/11 0417)  . dexmedetomidine (PRECEDEX) IV infusion for high rates 0.4 mcg/kg/hr (12/01/11 1000)  . DOPamine 4 mcg/kg/min (12/01/11 0144)  . insulin (NOVOLIN-R) infusion 11.6 Units (12/01/11 8657)  . lactated ringers 20 mL/hr at 11/30/11 1900  . milrinone 0.3 mcg/kg/min (12/01/11 1023)  .  nitroGLYCERIN Stopped (11/29/11 1639)  . phenylephrine (NEO-SYNEPHRINE) Adult infusion Stopped (12/01/11 0100)   PRN Meds:.metoprolol, midazolam, morphine, ondansetron (ZOFRAN) IV, sodium chloride, traMADol  General appearance: alert, cooperative and no distress Neurologic: intact Heart: regular rate and rhythm, S1, S2 normal, no murmur, click, rub or gallop Lungs: diminished breath sounds bibasilar Abdomen: soft, non-tender; bowel sounds normal; no masses,  no organomegaly Extremities: extremities normal, atraumatic, no cyanosis or edema and Homans sign is negative, no sign of DVT  Lab Results: CBC: Basename 12/01/11 0415 11/30/11 1609 11/30/11 1600  WBC 15.4* -- 17.5*  HGB 8.1* 7.8* --  HCT 24.0* 23.0* --  PLT 192 -- 202   BMET:  Basename 12/01/11 0415 11/30/11 1609 11/30/11 0345  NA 135 139 --  K 3.8 3.9 --  CL  101 104 --  CO2 24 -- 23  GLUCOSE 304* 200* --  BUN 16 12 --  CREATININE 0.87 0.90 --  CALCIUM 8.6 -- 8.3*    PT/INR:  Basename 11/29/11 2044  LABPROT 20.8*  INR 1.76*   Radiology: Dg Chest 1 View  11/29/2011  *RADIOLOGY REPORT*  Clinical Data: Hypotension  CHEST - 1 VIEW  Comparison: Earlier film of the same day  Findings: Endotracheal tube, nasogastric tube, and mediastinal drain are stable.  Right IJ Swan-Ganz has been advanced slightly into the pulmonary outflow tract.  Low lung volumes with perihilar and infrahilar atelectasis or consolidation, left greater than right as before.  No definite pleural effusion.  Mild cardiomegaly stable.  No pneumothorax.  IMPRESSION:  1.  Advancement of Swan-Ganz into the pulmonary outflow tract. 2.  Stable asymmetric atelectasis or infiltrates.  Original Report Authenticated By: Osa Craver, M.D.   Dg Chest Portable 1 View In Am  12/01/2011  *RADIOLOGY REPORT*  Clinical Data: Cardiac surgery.  PORTABLE CHEST - 1 VIEW  Comparison: 11/30/2011.  Findings: Endotracheal tube, nasogastric tube, right IJ central line and Swan Ganz catheter are unchanged.  Basilar atelectasis and airspace disease is present.  Cardiomegaly.  Postop changes of median sternotomy.  Bilateral pleural effusions are present. Retrocardiac density is present compatible with atelectasis.  No pneumothorax.  Mediastinal drains unchanged.  IMPRESSION:  1.  Stable support apparatus. 2.  No interval change in bilateral effusions, basilar atelectasis and airspace disease.  Original Report Authenticated By: Andreas Newport, M.D.   Dg Chest Portable 1 View In Am  11/30/2011  *RADIOLOGY REPORT*  Clinical Data: Status post cardiac surgery  PORTABLE CHEST - 1 VIEW  Comparison: Multiple recent previous exams.  Findings: Endotracheal tube tip is 5.7 cm above the base of the carina.  The right IJ pulmonary artery catheter tip is in the main pulmonary artery. The NG tube passes into the stomach  although the distal tip position is not included on the film.  Midline pericardial/mediastinal drain remains in place.  Lung volumes are low without evidence for pneumothorax.  There is left greater than right bibasilar atelectasis.  IMPRESSION: No substantial change exam.  Original Report Authenticated By: ERIC A. MANSELL, M.D.   Dg Chest Portable 1 View  11/29/2011  *RADIOLOGY REPORT*  Clinical Data: Status post CABG  PORTABLE CHEST - 1 VIEW  Comparison: 11/16/2011  Findings:  The ET tube tip is above the carina.  There is a Swan-Ganz catheter with tip in the right ventricle. Nasogastric tube is in place.  The heart size appears enlarged.  There are low lung volumes, small effusions and pulmonary venous congestion.  IMPRESSION:  1. Mild CHF. 2.  Support apparatus as described above.  Original Report Authenticated By: Rosealee Albee, M.D.     Assessment/Plan: S/P Procedure(s) (LRB): AORTIC VALVE REPLACEMENT (AVR) (N/A) Mobilize Diuresis d/c tubes/lines See progression orders Back on insulin drip, also in [place], continue DM control    Delight Ovens MD  Beeper [phone] Office [phone] 12/01/2011 10:45 AM

## 2011-12-01 NOTE — Progress Notes (Signed)
Pt doing well POD 2 s/p AVR. NO complaints today. VSS Hemodynamically stable.  Extubated and now on 4 Liters Oxygen via [place] He is up 8 liters since he went to the OR, would diurese aggressively as BP tolerates.

## 2011-12-02 ENCOUNTER — Inpatient Hospital Stay (HOSPITAL_COMMUNITY): Payer: BC Managed Care – PPO

## 2011-12-02 LAB — CBC
HCT: 22.3 % — ABNORMAL LOW (ref 39.0–52.0)
Hemoglobin: 7.3 g/dL — ABNORMAL LOW (ref 13.0–17.0)
MCH: 27.2 pg (ref 26.0–34.0)
MCHC: 32.7 g/dL (ref 30.0–36.0)
MCV: 83.2 fL (ref 78.0–100.0)
Platelets: 207 10*3/uL (ref 150–400)
RBC: 2.68 MIL/uL — ABNORMAL LOW (ref 4.22–5.81)
RDW: 14.5 % (ref 11.5–15.5)
WBC: 17.4 10*3/uL — ABNORMAL HIGH (ref 4.0–10.5)

## 2011-12-02 LAB — GLUCOSE, CAPILLARY
Glucose-Capillary: 154 mg/dL — ABNORMAL HIGH (ref 70–99)
Glucose-Capillary: 162 mg/dL — ABNORMAL HIGH (ref 70–99)

## 2011-12-02 LAB — BASIC METABOLIC PANEL
BUN: 27 mg/dL — ABNORMAL HIGH (ref 6–23)
CO2: 27 mEq/L (ref 19–32)
Calcium: 8.6 mg/dL (ref 8.4–10.5)
Chloride: 102 mEq/L (ref 96–112)
Creatinine, Ser: 1.05 mg/dL (ref 0.50–1.35)
GFR calc Af Amer: 87 mL/min — ABNORMAL LOW (ref >90)
GFR calc non Af Amer: 75 mL/min — ABNORMAL LOW (ref >90)
Glucose, Bld: 163 mg/dL — ABNORMAL HIGH (ref 70–99)
Potassium: 4 mEq/L (ref 3.5–5.1)
Sodium: 137 mEq/L (ref 135–145)

## 2011-12-02 MED ORDER — BIOTENE DRY MOUTH MT LIQD
15.0000 mL | Freq: Two times a day (BID) | OROMUCOSAL | Status: DC
  Administered 2011-12-03 – 2011-12-17 (×24): 15 mL via OROMUCOSAL

## 2011-12-02 MED ORDER — ENOXAPARIN SODIUM 30 MG/0.3ML ~~LOC~~ SOLN
30.0000 mg | SUBCUTANEOUS | Status: DC
  Administered 2011-12-02 – 2011-12-04 (×3): 30 mg via SUBCUTANEOUS
  Filled 2011-12-02 (×3): qty 0.3

## 2011-12-02 MED ORDER — FUROSEMIDE 10 MG/ML IJ SOLN
80.0000 mg | Freq: Two times a day (BID) | INTRAMUSCULAR | Status: DC
  Administered 2011-12-02 – 2011-12-06 (×10): 80 mg via INTRAVENOUS
  Filled 2011-12-02 (×14): qty 8

## 2011-12-02 MED ORDER — AMIODARONE HCL 200 MG PO TABS
400.0000 mg | ORAL_TABLET | Freq: Two times a day (BID) | ORAL | Status: DC
  Administered 2011-12-02 – 2011-12-12 (×22): 400 mg via ORAL
  Filled 2011-12-02 (×25): qty 2

## 2011-12-02 NOTE — Progress Notes (Signed)
Patient ID: Antonio [name]., male   DOB: 08-05-1951, 61 y.o.   MRN: [id number] TCTS DAILY PROGRESS NOTE                   [address]            [place] [address]          [phone]      3 Days Post-Op Procedure(s) (LRB): AORTIC VALVE REPLACEMENT (AVR) (N/A)  Total Length of Stay:  LOS: 19 days   Subjective: No complaints  Objective: Vital signs in last 24 hours: Temp:  [97.8 F (36.6 C)-100 F (37.8 C)] 98 F (36.7 C) (02/03 0730) Pulse Rate:  [80-109] 80  (02/03 0900) Cardiac Rhythm:  [-] Normal sinus rhythm (02/03 0900) Resp:  [15-29] 18  (02/03 0900) BP: (67-124)/(38-77) 118/71 mmHg (02/03 0900) SpO2:  [91 %-98 %] 96 % (02/03 0900) Arterial Line BP: (66-130)/(42-66) 119/63 mmHg (02/03 0900) FiO2 (%):  [0 %] 0 % (02/02 0936) Weight:  [373 lb 0.3 oz (169.2 kg)] 373 lb 0.3 oz (169.2 kg) (02/03 0500)  Filed Weights   11/29/11 0530 11/30/11 0330 12/02/11 0500  Weight: 349 lb 6.9 oz (158.5 kg) 375 lb 10.6 oz (170.4 kg) 373 lb 0.3 oz (169.2 kg)      Hemodynamic parameters for last 24 hours: PAP: (40-41)/(19-22) 40/19 mmHg  Intake/Output from previous day: 02/02 0701 - 02/03 0700 In: 1582.4 [P.O.:80; I.V.:1422.4; NG/GT:30; IV Piggyback:50] Out: 845 [Urine:845]  Intake/Output this shift: Total I/O In: 91.2 [I.V.:91.2] Out: -   Current Meds: Scheduled Meds:   . acetaminophen  1,000 mg Oral Q6H   Or  . acetaminophen (TYLENOL) oral liquid 160 mg/5 mL  975 mg Per Tube Q6H  . amiodarone  400 mg Oral BID  . bisacodyl  10 mg Oral Daily   Or  . bisacodyl  10 mg Rectal Daily  . cefUROXime (ZINACEF)  IV  1.5 g Intravenous Q12H  . citalopram  20 mg Oral Daily  . colchicine  0.6 mg Oral BID  . digoxin  0.25 mg Intravenous Daily  . docusate sodium  200 mg Oral Daily  . fenofibrate  160 mg Oral Daily  . furosemide  80 mg Intravenous BID  . insulin aspart  0-24 Units Subcutaneous Q4H  . insulin glargine  20 Units Subcutaneous BID  . insulin  regular  0-10 Units Intravenous TID WC  . metoprolol tartrate  12.5 mg Oral BID   Or  . metoprolol tartrate  12.5 mg Per Tube BID  . pantoprazole  40 mg Oral Q1200  . rosuvastatin  20 mg Oral q1800  . sodium chloride  3 mL Intravenous Q12H   Continuous Infusions:   . sodium chloride 20 mL/hr at 11/29/11 1515  . sodium chloride 10 mL/hr at 11/30/11 0200  . sodium chloride    . dexmedetomidine (PRECEDEX) IV infusion for high rates Stopped (12/01/11 1100)  . insulin (NOVOLIN-R) infusion 11.6 Units (12/01/11 2841)  . lactated ringers 20 mL/hr at 12/01/11 2246  . nitroGLYCERIN Stopped (11/29/11 1639)  . phenylephrine (NEO-SYNEPHRINE) Adult infusion Stopped (12/01/11 0100)  . DISCONTD: amiodarone (NEXTERONE PREMIX) 360 mg/200 mL dextrose 0.5 mg/min (12/02/11 0245)  . DISCONTD: DOPamine 3 mcg/kg/min (12/02/11 0006)  . DISCONTD: milrinone Stopped (12/01/11 1500)   PRN Meds:.metoprolol, midazolam, morphine, ondansetron (ZOFRAN) IV, sodium chloride, traMADol  General appearance: alert, cooperative and no distress Neurologic: intact Heart: regular rate and rhythm, S1, S2 normal, no murmur, click,  rub or gallop Lungs: clear to auscultation bilaterally Abdomen: soft, non-tender; bowel sounds normal; no masses,  no organomegaly Extremities: extremities normal, atraumatic, no cyanosis or edema and Homans sign is negative, no sign of DVT  Lab Results: CBC: Basename 12/02/11 0400 12/01/11 0415  WBC 17.4* 15.4*  HGB 7.3* 8.1*  HCT 22.3* 24.0*  PLT 207 192   BMET:  Basename 12/02/11 0400 12/01/11 0415  NA 137 135  K 4.0 3.8  CL 102 101  CO2 27 24  GLUCOSE 163* 304*  BUN 27* 16  CREATININE 1.05 0.87  CALCIUM 8.6 8.6    PT/INR:  Basename 11/29/11 2044  LABPROT 20.8*  INR 1.76*   Radiology: Dg Chest Portable 1 View In Am  12/01/2011  *RADIOLOGY REPORT*  Clinical Data: Cardiac surgery.  PORTABLE CHEST - 1 VIEW  Comparison: 11/30/2011.  Findings: Endotracheal tube, nasogastric tube,  right IJ central line and Swan Ganz catheter are unchanged.  Basilar atelectasis and airspace disease is present.  Cardiomegaly.  Postop changes of median sternotomy.  Bilateral pleural effusions are present. Retrocardiac density is present compatible with atelectasis.  No pneumothorax.  Mediastinal drains unchanged.  IMPRESSION:  1.  Stable support apparatus. 2.  No interval change in bilateral effusions, basilar atelectasis and airspace disease.  Original Report Authenticated By: Andreas Newport, M.D.   Dg Chest Port 1v Same Day  12/02/2011  *RADIOLOGY REPORT*  Clinical Data: Aortic valve replacement, postoperative exam, extubated  PORTABLE CHEST - 1 VIEW SAME DAY  Comparison: 12/01/2011  Findings: Endotracheal tube, NG tube, Swan-Ganz catheter removed. Right IJ vascular sheath remains.  Heart remains enlarged with patchy bilateral perihilar and lower lobe airspace disease/atelectasis.  Pleural effusions remain.  No pneumothorax. No significant interval change.  IMPRESSION: Extubated.  Otherwise stable portable chest exam.  Original Report Authenticated By: Judie Petit. Ruel Favors, M.D.     Assessment/Plan: S/P Procedure(s) (LRB): AORTIC VALVE REPLACEMENT (AVR) (N/A) Acute expected blood loss anemia, wait on transfusion Increase dose of lasix Lovenox for dvt prhalaxis mobilize     Delight Ovens MD  Beeper [phone] Office [phone] 12/02/2011 9:20 AM

## 2011-12-03 ENCOUNTER — Inpatient Hospital Stay (HOSPITAL_COMMUNITY): Payer: BC Managed Care – PPO

## 2011-12-03 ENCOUNTER — Encounter (HOSPITAL_COMMUNITY): Payer: Self-pay | Admitting: Cardiothoracic Surgery

## 2011-12-03 LAB — BASIC METABOLIC PANEL
BUN: 31 mg/dL — ABNORMAL HIGH (ref 6–23)
CO2: 29 mEq/L (ref 19–32)
Calcium: 8.7 mg/dL (ref 8.4–10.5)
Chloride: 100 mEq/L (ref 96–112)
Creatinine, Ser: 1.21 mg/dL (ref 0.50–1.35)
GFR calc Af Amer: 73 mL/min — ABNORMAL LOW (ref >90)
GFR calc non Af Amer: 63 mL/min — ABNORMAL LOW (ref >90)
Glucose, Bld: 118 mg/dL — ABNORMAL HIGH (ref 70–99)
Potassium: 4.2 mEq/L (ref 3.5–5.1)
Sodium: 136 mEq/L (ref 135–145)

## 2011-12-03 LAB — GLUCOSE, CAPILLARY
Glucose-Capillary: 97 mg/dL (ref 70–99)
Glucose-Capillary: 117 mg/dL — ABNORMAL HIGH (ref 70–99)

## 2011-12-03 LAB — CBC
HCT: 23.3 % — ABNORMAL LOW (ref 39.0–52.0)
Hemoglobin: 7.4 g/dL — ABNORMAL LOW (ref 13.0–17.0)
MCH: 27.1 pg (ref 26.0–34.0)
MCHC: 31.8 g/dL (ref 30.0–36.0)
MCV: 85.3 fL (ref 78.0–100.0)
Platelets: 239 10*3/uL (ref 150–400)
RBC: 2.73 MIL/uL — ABNORMAL LOW (ref 4.22–5.81)
RDW: 14.9 % (ref 11.5–15.5)
WBC: 12.5 10*3/uL — ABNORMAL HIGH (ref 4.0–10.5)

## 2011-12-03 LAB — PREPARE RBC (CROSSMATCH)

## 2011-12-03 MED ORDER — SODIUM CHLORIDE 0.45 % IV SOLN
INTRAVENOUS | Status: DC
  Administered 2011-12-03 (×2): 20 mL via INTRAVENOUS

## 2011-12-03 MED ORDER — SODIUM CHLORIDE 0.9 % IJ SOLN
10.0000 mL | Freq: Two times a day (BID) | INTRAMUSCULAR | Status: DC
  Administered 2011-12-04 (×2): 10 mL
  Administered 2011-12-05: 20 mL
  Administered 2011-12-05 – 2011-12-06 (×2): 10 mL
  Administered 2011-12-06 – 2011-12-07 (×2): 20 mL
  Filled 2011-12-03: qty 20

## 2011-12-03 MED ORDER — SODIUM CHLORIDE 0.9 % IJ SOLN
10.0000 mL | INTRAMUSCULAR | Status: DC | PRN
  Administered 2011-12-06: 10 mL
  Administered 2011-12-07 – 2011-12-09 (×3): 20 mL
  Administered 2011-12-10 (×2): 10 mL
  Administered 2011-12-11: 20 mL
  Administered 2011-12-12 – 2011-12-13 (×6): 10 mL
  Administered 2011-12-14: 20 mL
  Administered 2011-12-15 (×2): 10 mL
  Administered 2011-12-16: 20 mL

## 2011-12-03 MED FILL — Sodium Chloride Irrigation Soln 0.9%: Qty: 2000 | Status: AC

## 2011-12-03 MED FILL — Lidocaine HCl IV Inj 20 MG/ML: INTRAVENOUS | Qty: 10 | Status: AC

## 2011-12-03 MED FILL — Sodium Bicarbonate IV Soln 8.4%: INTRAVENOUS | Qty: 50 | Status: AC

## 2011-12-03 MED FILL — Electrolyte-R (PH 7.4) Solution: INTRAVENOUS | Qty: 4000 | Status: AC

## 2011-12-03 MED FILL — Heparin Sodium (Porcine) Inj 1000 Unit/ML: INTRAMUSCULAR | Qty: 50 | Status: AC

## 2011-12-03 MED FILL — Sodium Chloride IV Soln 0.9%: INTRAVENOUS | Qty: 2000 | Status: AC

## 2011-12-03 MED FILL — Mannitol IV Soln 20%: INTRAVENOUS | Qty: 500 | Status: AC

## 2011-12-03 NOTE — Progress Notes (Signed)
Patient ID: Antonio [name]., male   DOB: 07-10-1951, 61 y.o.   MRN: [id number]   Filed Vitals:   12/03/11 1935 12/03/11 2000 12/03/11 2106 12/03/11 2115  BP:  117/57  131/72  Pulse:  81 78 78  Temp: 98 F (36.7 C)     TempSrc: Oral     Resp:  22 18 17   Height:      Weight:      SpO2:  92% 94% 92%   Sleeping on cpap  No new problems tonight.

## 2011-12-03 NOTE — Progress Notes (Signed)
Pt is doing well s/p AVR POD #4.  Wearing CPAP tonight.  Still is volume overloaded and with blood transfusion, would aggressively diurese. If he does not have significant output tonight, would consider Lasix drip or TID dosing of IV Lasix. He is 7 liters positive since he went to the OR and has I/O have been mostly matched over the last three days.   Kanyah Matsushima ]7:07 PM 12/03/2011

## 2011-12-03 NOTE — Progress Notes (Signed)
CSW spoke with pt wife to provide support and discuss d/c planning. Pt wife states pt somehwat agitated with CPAP, and pt wife plans to bring in pt's CPAP from home in hopes it will be more comfortable for pt.  CSW will initiate SNF search and will follow for d/c planning.  Baxter Flattery, MSW [phone]

## 2011-12-03 NOTE — Progress Notes (Signed)
UR Completed.  Antonio [name] Jane [phone] 12/03/2011  

## 2011-12-03 NOTE — Progress Notes (Signed)
Nursing Brief Progress:  Throughout the morning [name] has been quite poorly motivated, he was unable to tolerate sitting up in the chair for longer than 1/2 hour.  Ambulation this morning was at most 38ft.  Will continue to motivate and monitor closely.

## 2011-12-03 NOTE — Progress Notes (Signed)
4 Days Post-Op Procedure(s) (LRB): AORTIC VALVE REPLACEMENT (AVR) (N/A)                    [address]            [place] [address]          [phone]     Subjective: The patient has been stable on 4 L nasal cannula oxygen in sinus rhythm. He is able to ambulate short distance but becomes short of breath. The patient is on CPAP every evening for sleep apnea.  Objective: Vital signs in last 24 hours: Temp:  [97.6 F (36.4 C)-98.7 F (37.1 C)] 98.7 F (37.1 C) (02/04 1629) Pulse Rate:  [72-94] 77  (02/04 1600) Cardiac Rhythm:  [-] Normal sinus rhythm (02/04 0740) Resp:  [15-26] 18  (02/04 1600) BP: (100-138)/(37-94) 100/77 mmHg (02/04 1600) SpO2:  [88 %-99 %] 94 % (02/04 1600) Weight:  [370 lb 9.5 oz (168.1 kg)] 370 lb 9.5 oz (168.1 kg) (02/04 0500)  Hemodynamic parameters for last 24 hours:    normal sinus rhythm  Intake/Output from previous day: 02/03 0701 - 02/04 0700 In: 728.6 [P.O.:120; I.V.:604.6; IV Piggyback:4] Out: 700 [Urine:700] Intake/Output this shift: Total I/O In: 497 [I.V.:3; Blood:490; IV Piggyback:4] Out: 950 [Urine:950]  Physical exam General appearance  obese able to sit and stand and take a few steps Lungs distant breath sounds Cardiac regular rhythm no murmur sternal incision clean and dry Extremities warm 1-2+ edema Neuro intact  Lab Results:  Basename 12/03/11 0325 12/02/11 0400  WBC 12.5* 17.4*  HGB 7.4* 7.3*  HCT 23.3* 22.3*  PLT 239 207   BMET:  Basename 12/03/11 0325 12/02/11 0400  NA 136 137  K 4.2 4.0  CL 100 102  CO2 29 27  GLUCOSE 118* 163*  BUN 31* 27*  CREATININE 1.21 1.05  CALCIUM 8.7 8.6    PT/INR: No results found for this basename: LABPROT,INR in the last 72 hours ABG    Component Value Date/Time   PHART 7.422 12/01/2011 0912   HCO3 24.4* 12/01/2011 0912   TCO2 26 12/01/2011 0912   ACIDBASEDEF 3.0* 12/01/2011 0414   O2SAT 92.0 12/01/2011 0912   CBG (last 3)   Basename 12/03/11 1631 12/03/11 1218  12/03/11 0746  GLUCAP 107* 117* 109*    Assessment/Plan: S/P Procedure(s) (LRB): AORTIC VALVE REPLACEMENT (AVR) (N/A)  Plan  1 unit of packed cells for acute blood loss anemia, continue diuresis, continue physical therapy and pulmonary toilet. The patient needs CPAP every evening for sleep apnea. Follow elevated white count 17,000   LOS: 20 days    VAN TRIGT III,Burke Terry 12/03/2011

## 2011-12-03 NOTE — Progress Notes (Signed)
Nursing Daily Progress: Throughout the day today patient has been stable, refused to get oob as of now.  This afternoon patient was having difficulty maintaining sats >90, advised patient that since he did not wear his Bipap last night that he should try for a little while, he initially refused but family was able to convince him.  Prior to Bipap [name] was becoming increasingly more difficult to arouse and his family was concerned that his CO2 might be climbing, patient had recently received pain medication which may be the cause of his apparent lethargy.  Will continue to monitor closely and attempt once again to get oob and ambulate as per md orders.

## 2011-12-04 LAB — GLUCOSE, CAPILLARY
Glucose-Capillary: 102 mg/dL — ABNORMAL HIGH (ref 70–99)
Glucose-Capillary: 105 mg/dL — ABNORMAL HIGH (ref 70–99)

## 2011-12-04 LAB — BASIC METABOLIC PANEL
BUN: 34 mg/dL — ABNORMAL HIGH (ref 6–23)
CO2: 30 mEq/L (ref 19–32)
Calcium: 9.1 mg/dL (ref 8.4–10.5)
Chloride: 101 mEq/L (ref 96–112)
Creatinine, Ser: 1.12 mg/dL (ref 0.50–1.35)
GFR calc Af Amer: 80 mL/min — ABNORMAL LOW (ref >90)
GFR calc non Af Amer: 69 mL/min — ABNORMAL LOW (ref >90)
Glucose, Bld: 112 mg/dL — ABNORMAL HIGH (ref 70–99)
Potassium: 4 mEq/L (ref 3.5–5.1)
Sodium: 140 mEq/L (ref 135–145)

## 2011-12-04 LAB — CBC
HCT: 24.8 % — ABNORMAL LOW (ref 39.0–52.0)
Hemoglobin: 8 g/dL — ABNORMAL LOW (ref 13.0–17.0)
MCH: 27.6 pg (ref 26.0–34.0)
MCHC: 32.3 g/dL (ref 30.0–36.0)
MCV: 85.5 fL (ref 78.0–100.0)
Platelets: 279 10*3/uL (ref 150–400)
RBC: 2.9 MIL/uL — ABNORMAL LOW (ref 4.22–5.81)
RDW: 14.9 % (ref 11.5–15.5)
WBC: 10.1 10*3/uL (ref 4.0–10.5)

## 2011-12-04 LAB — TYPE AND SCREEN
ABO/RH(D): AB POS
Antibody Screen: NEGATIVE
Unit division: 0

## 2011-12-04 MED ORDER — ENOXAPARIN SODIUM 40 MG/0.4ML ~~LOC~~ SOLN
40.0000 mg | SUBCUTANEOUS | Status: DC
  Administered 2011-12-05: 40 mg via SUBCUTANEOUS
  Filled 2011-12-04 (×2): qty 0.4

## 2011-12-04 NOTE — Progress Notes (Signed)
Nutrition Follow-up  Pt s/p aortic valve replacement 1/31. Pt states is appetite is poor. RD offered supplementation -- pt declining at this time -- reports his gout "flares up" when he consumes supplements.  Diet Order:  Full Liquids  Meds: Scheduled Meds:   . acetaminophen  1,000 mg Oral Q6H   Or  . acetaminophen (TYLENOL) oral liquid 160 mg/5 mL  975 mg Per Tube Q6H  . amiodarone  400 mg Oral BID  . antiseptic oral rinse  15 mL Mouth Rinse BID  . bisacodyl  10 mg Oral Daily   Or  . bisacodyl  10 mg Rectal Daily  . citalopram  20 mg Oral Daily  . colchicine  0.6 mg Oral BID  . docusate sodium  200 mg Oral Daily  . enoxaparin  30 mg Subcutaneous Q24H  . fenofibrate  160 mg Oral Daily  . furosemide  80 mg Intravenous BID  . insulin aspart  0-24 Units Subcutaneous Q4H  . insulin glargine  20 Units Subcutaneous BID  . metoprolol tartrate  12.5 mg Oral BID   Or  . metoprolol tartrate  12.5 mg Per Tube BID  . pantoprazole  40 mg Oral Q1200  . rosuvastatin  20 mg Oral q1800  . sodium chloride  10-40 mL Intracatheter Q12H  . sodium chloride  3 mL Intravenous Q12H   Continuous Infusions:   . sodium chloride 20 mL (12/03/11 2222)   PRN Meds:.metoprolol, ondansetron (ZOFRAN) IV, sodium chloride, sodium chloride, traMADol, DISCONTD: morphine  Labs:  CMP     Component Value Date/Time   NA 140 12/04/2011 0508   K 4.0 12/04/2011 0508   CL 101 12/04/2011 0508   CO2 30 12/04/2011 0508   GLUCOSE 112* 12/04/2011 0508   BUN 34* 12/04/2011 0508   CREATININE 1.12 12/04/2011 0508   CALCIUM 9.1 12/04/2011 0508   PROT 7.0 11/27/2011 0520   ALBUMIN 3.0* 11/27/2011 0520   AST 18 11/27/2011 0520   ALT 24 11/27/2011 0520   ALKPHOS 69 11/27/2011 0520   BILITOT 0.2* 11/27/2011 0520   GFRNONAA 69* 12/04/2011 0508   GFRAA 80* 12/04/2011 0508     Intake/Output Summary (Last 24 hours) at 12/04/11 1110 Last data filed at 12/04/11 0900  Gross per 24 hour  Intake   1062 ml  Output   1850 ml  Net   -788 ml     CBG (last 3)   Basename 12/04/11 0753 12/04/11 0352 12/03/11 2349  GLUCAP 116* 105* 102*    Weight Status:  168.4 kg (2/5) -- weight up likely due to fluid  Re-estimated needs:  1900-2000 kcals, 110-120 gm protein  Nutrition Dx:  Not ready for diet/lifestyle change, inactive New Nutrition Dx: Inadequate Oral Intake r/t poor appetite as evidenced by pt report & tray observation  Goal:  Oral intake to meet >90% of estimated nutrition needs, unmet Monitor: PO intake, weight, labs, I/O's  Intervention:    No nutrition intervention at this time -- pt declining  RD to follow for nutrition care plan  Alger Memos Pager #:  [phone]

## 2011-12-04 NOTE — Progress Notes (Signed)
Subjective:  Feels well this am.  No significant dyspnea or chest pain. Improved response to IV lasix past 24 hours. Chest xray yesterday was improved.  Objective:  Vital Signs in the last 24 hours: Temp:  [97.4 F (36.3 C)-98.7 F (37.1 C)] 97.7 F (36.5 C) (02/05 0755) Pulse Rate:  [72-81] 77  (02/05 0700) Resp:  [16-25] 16  (02/05 0700) BP: (100-148)/(37-91) 139/87 mmHg (02/05 0700) SpO2:  [88 %-99 %] 99 % (02/05 0700) Weight:  [371 lb 4.1 oz (168.4 kg)] 371 lb 4.1 oz (168.4 kg) (02/05 0500)  Intake/Output from previous day: 02/04 0701 - 02/05 0700 In: 1121 [P.O.:120; I.V.:503; Blood:490; IV Piggyback:8] Out: 2300 [Urine:2300] Intake/Output from this shift:       . acetaminophen  1,000 mg Oral Q6H   Or  . acetaminophen (TYLENOL) oral liquid 160 mg/5 mL  975 mg Per Tube Q6H  . amiodarone  400 mg Oral BID  . antiseptic oral rinse  15 mL Mouth Rinse BID  . bisacodyl  10 mg Oral Daily   Or  . bisacodyl  10 mg Rectal Daily  . citalopram  20 mg Oral Daily  . colchicine  0.6 mg Oral BID  . docusate sodium  200 mg Oral Daily  . enoxaparin  30 mg Subcutaneous Q24H  . fenofibrate  160 mg Oral Daily  . furosemide  80 mg Intravenous BID  . insulin aspart  0-24 Units Subcutaneous Q4H  . insulin glargine  20 Units Subcutaneous BID  . metoprolol tartrate  12.5 mg Oral BID   Or  . metoprolol tartrate  12.5 mg Per Tube BID  . pantoprazole  40 mg Oral Q1200  . rosuvastatin  20 mg Oral q1800  . sodium chloride  10-40 mL Intracatheter Q12H  . sodium chloride  3 mL Intravenous Q12H  . DISCONTD: digoxin  0.25 mg Intravenous Daily      . sodium chloride 20 mL (12/03/11 2222)  . DISCONTD: sodium chloride 20 mL/hr at 11/29/11 1515  . DISCONTD: sodium chloride 10 mL/hr at 11/30/11 0200  . DISCONTD: sodium chloride    . DISCONTD: dexmedetomidine (PRECEDEX) IV infusion for high rates Stopped (12/01/11 1100)  . DISCONTD: lactated ringers 20 mL/hr at 12/02/11 2000  . DISCONTD:  nitroGLYCERIN Stopped (11/29/11 1639)  . DISCONTD: phenylephrine (NEO-SYNEPHRINE) Adult infusion Stopped (12/01/11 0100)    Physical Exam: The patient appears to be in no distress.  Head and neck exam reveals that the pupils are equal and reactive.  The extraocular movements are full.  There is no scleral icterus.  Mouth and pharynx are benign.  No lymphadenopathy.  No carotid bruits.  The jugular venous pressure is normal.  Thyroid is not enlarged or tender.  Chest is clear to percussion and auscultation.  No rales or rhonchi.  Expansion of the chest is symmetrical.  Heart reveals no abnormal lift or heave. Soft systolic murmur at base.  No gallop or rub.  The abdomen is soft and nontender.  Bowel sounds are normoactive.  There is no hepatosplenomegaly or mass.  There are no abdominal bruits.  Extremities reveal no phlebitis or edema.  Pedal pulses are good.  There is no cyanosis or clubbing.  Neurologic exam is normal strength and no lateralizing weakness.  No sensory deficits.  Integument reveals no rash  Lab Results:  Basename 12/04/11 0508 12/03/11 0325  WBC 10.1 12.5*  HGB 8.0* 7.4*  PLT 279 239    Basename 12/04/11 0508 12/03/11 0325  NA  140 136  K 4.0 4.2  CL 101 100  CO2 30 29  GLUCOSE 112* 118*  BUN 34* 31*  CREATININE 1.12 1.21   No results found for this basename: TROPONINI:2,CK,MB:2 in the last 72 hours Hepatic Function Panel No results found for this basename: PROT,ALBUMIN,AST,ALT,ALKPHOS,BILITOT,BILIDIR,IBILI in the last 72 hours No results found for this basename: CHOL in the last 72 hours No results found for this basename: PROTIME in the last 72 hours  Imaging: Imaging results have been reviewed  Cardiac Studies:  Assessment/Plan:  Patient Active Hospital Problem List: Systolic and diastolic CHF, 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   Plan: Continue IV Lasix *    LOS: 21 days    Cassell Clement 12/04/2011, 8:34  AM

## 2011-12-04 NOTE — Progress Notes (Signed)
5 Days Post-Op Procedure(s) (LRB): AORTIC VALVE REPLACEMENT (AVR) (N/A)                                             [address]            [name] [address]          [phone]      Subjective:                    Mentation improved on Q HS cpap Walked todorr of room, had BM Labs stable CBGs well controlled  Objective: Vital signs in last 24 hours: Temp:  [97.4 F (36.3 C)-98.7 F (37.1 C)] 97.5 F (36.4 C) (02/05 1224) Pulse Rate:  [72-81] 78  (02/05 0900) Cardiac Rhythm:  [-] Normal sinus rhythm (02/05 0900) Resp:  [16-25] 22  (02/05 0900) BP: (100-158)/(57-91) 158/71 mmHg (02/05 1025) SpO2:  [88 %-99 %] 98 % (02/05 1025) Weight:  [371 lb 4.1 oz (168.4 kg)] 371 lb 4.1 oz (168.4 kg) (02/05 0500)  Hemodynamic parameters for last 24 hours:  NSR  Intake/Output from previous day: 02/04 0701 - 02/05 0700 In: 1121 [P.O.:120; I.V.:503; Blood:490; IV Piggyback:8] Out: 2300 [Urine:2300] Intake/Output this shift: Total I/O In: 48 [I.V.:40; IV Piggyback:8] Out: -   Lungs clear No murmur Incision clean,dry  Lab Results:  Basename 12/04/11 0508 12/03/11 0325  WBC 10.1 12.5*  HGB 8.0* 7.4*  HCT 24.8* 23.3*  PLT 279 239   BMET:  Basename 12/04/11 0508 12/03/11 0325  NA 140 136  K 4.0 4.2  CL 101 100  CO2 30 29  GLUCOSE 112* 118*  BUN 34* 31*  CREATININE 1.12 1.21  CALCIUM 9.1 8.7    PT/INR: No results found for this basename: LABPROT,INR in the last 72 hours ABG    Component Value Date/Time   PHART 7.422 12/01/2011 0912   HCO3 24.4* 12/01/2011 0912   TCO2 26 12/01/2011 0912   ACIDBASEDEF 3.0* 12/01/2011 0414   O2SAT 92.0 12/01/2011 0912   CBG (last 3)   Basename 12/04/11 1222 12/04/11 0753 12/04/11 0352  GLUCAP 131* 116* 105*    Assessment/Plan: S/P Procedure(s) (LRB): AORTIC VALVE REPLACEMENT (AVR) (N/A) Keep in ICU until able to walk No coumadin planned   LOS: 21 days    VAN TRIGT III,PETER 12/04/2011

## 2011-12-04 NOTE — Progress Notes (Signed)
Physical Therapy Treatment Patient Details Name: Antonio [name]. MRN: [id number] DOB: 03-27-1951 Today's Date: 12/04/2011  PT Assessment/Plan  PT - Assessment/Plan Comments on Treatment Session: Pt s/p AVR. Pt progressing with ambulation and transfers today. Pt and dgtrs educated for sternal precautions and mobility progression. Pt OOB on arrival and encouraged to continue mobility and HEP. PT Plan: Discharge plan remains appropriate;Frequency remains appropriate Follow Up Recommendations: Skilled nursing facility PT Goals  Acute Rehab PT Goals PT Goal: Sit to Stand - Progress: Progressing toward goal PT Goal: Stand to Sit - Progress: Progressing toward goal PT Transfer Goal: Bed to Chair/Chair to Bed - Progress: Progressing toward goal PT Goal: Ambulate - Progress: Progressing toward goal Additional Goals PT Goal: Additional Goal #1 - Progress: Progressing toward goal  PT Treatment Precautions/Restrictions  Precautions Precautions: Sternal;Fall Required Braces or Orthoses: No Restrictions Weight Bearing Restrictions: No Mobility (including Balance) Bed Mobility Bed Mobility: No Transfers Sit to Stand: 1: +2 Total assist;Other (comment) (from bari WC) Sit to Stand Details (indicate cue type and reason): 2 trials with cueing for hands on thighs, assist for anterior translation. During 2nd trial after amb required 3 attempts before pt fully engaged muscles to assist with successful stand Stand to Sit: 4: Min assist Stand to Sit Details: cueing for hand placement and assist to control descent to bari [facility] Ambulation/Gait Ambulation/Gait: Yes Ambulation/Gait Assistance Details (indicate cue type and reason): Pt with wide BOS and slow gait with short strides. Amb limited by pt dizziness Ambulation Distance (Feet): 15 Feet (7' then 49' ), seated rest approximately 8 min between amb trials to recover from dizziness Assistive device: Rolling walker Gait Pattern: Decreased stride length    Posture/Postural Control Posture/Postural Control: No significant limitations Balance Balance Assessed: No Exercise  General Exercises - Lower Extremity Long Arc Quad: AROM;15 reps;Seated;Both Hip Flexion/Marching: AROM;Both;Other reps (comment);Seated (20reps) End of Session PT - End of Session Activity Tolerance: Other (comment) (amb limited by dizziness) Patient left: in chair;with call bell in reach;with family/visitor present Nurse Communication: Mobility status for transfers;Mobility status for ambulation General Behavior During Session: [facility] for tasks performed Cognition: [facility] for tasks performed  Delorse Lek 12/04/2011, 12:33 PM Toney Sang, PT [phone]

## 2011-12-04 NOTE — Progress Notes (Signed)
Pt much improved this morning and in good spirits. Pt inquiring as to CIR, reluctantly agreeable to SNF as backup, recognizing he will need help at time of d/c. CSW referred to [facility] for CIR to consider this pt.   Will continue to follow and await CIR evaluation and proceed as appropriate with d/c planning.  Baxter Flattery, MSW [phone]

## 2011-12-04 NOTE — Progress Notes (Signed)
Patient ID: Antonio [name]., male   DOB: [birth year], 61 y.o.   MRN: [id number] BP 151/69  Pulse 73  Temp(Src) 97.8 F (36.6 C) (Oral)  Resp 20  Ht 6\' 1"  (1.854 m)  Wt 371 lb 4.1 oz (168.4 kg)  BMI 48.98 kg/m2  SpO2 99% Stable day Sinus rhythm Walked but very short distance Delight Ovens MD  Beeper [phone] Office [phone] 12/04/2011 7:03 PM

## 2011-12-05 ENCOUNTER — Inpatient Hospital Stay (HOSPITAL_COMMUNITY): Payer: BC Managed Care – PPO

## 2011-12-05 DIAGNOSIS — R5381 Other malaise: Secondary | ICD-10-CM

## 2011-12-05 DIAGNOSIS — I080 Rheumatic disorders of both mitral and aortic valves: Secondary | ICD-10-CM

## 2011-12-05 LAB — GLUCOSE, CAPILLARY: Glucose-Capillary: 143 mg/dL — ABNORMAL HIGH (ref 70–99)

## 2011-12-05 LAB — CBC
HCT: 26.7 % — ABNORMAL LOW (ref 39.0–52.0)
Hemoglobin: 8.4 g/dL — ABNORMAL LOW (ref 13.0–17.0)
MCH: 26.8 pg (ref 26.0–34.0)
MCHC: 31.5 g/dL (ref 30.0–36.0)
MCV: 85.3 fL (ref 78.0–100.0)
Platelets: 374 K/uL (ref 150–400)
RBC: 3.13 MIL/uL — ABNORMAL LOW (ref 4.22–5.81)
RDW: 15 % (ref 11.5–15.5)
WBC: 9.8 K/uL (ref 4.0–10.5)

## 2011-12-05 LAB — BASIC METABOLIC PANEL
BUN: 29 mg/dL — ABNORMAL HIGH (ref 6–23)
CO2: 34 mEq/L — ABNORMAL HIGH (ref 19–32)
Calcium: 9.3 mg/dL (ref 8.4–10.5)
Chloride: 101 mEq/L (ref 96–112)
Creatinine, Ser: 1.03 mg/dL (ref 0.50–1.35)
GFR calc Af Amer: 89 mL/min — ABNORMAL LOW (ref >90)
GFR calc non Af Amer: 76 mL/min — ABNORMAL LOW (ref >90)
Glucose, Bld: 130 mg/dL — ABNORMAL HIGH (ref 70–99)
Potassium: 3.8 mEq/L (ref 3.5–5.1)
Sodium: 143 mEq/L (ref 135–145)

## 2011-12-05 LAB — CLOSTRIDIUM DIFFICILE BY PCR: Toxigenic C. Difficile by PCR: NEGATIVE

## 2011-12-05 MED ORDER — INSULIN ASPART 100 UNIT/ML ~~LOC~~ SOLN
0.0000 [IU] | Freq: Three times a day (TID) | SUBCUTANEOUS | Status: DC
  Administered 2011-12-05: 3 [IU] via SUBCUTANEOUS
  Administered 2011-12-05: 4 [IU] via SUBCUTANEOUS
  Administered 2011-12-06: 3 [IU] via SUBCUTANEOUS
  Administered 2011-12-06: 4 [IU] via SUBCUTANEOUS
  Administered 2011-12-07 – 2011-12-08 (×4): 3 [IU] via SUBCUTANEOUS
  Administered 2011-12-08 – 2011-12-09 (×2): 4 [IU] via SUBCUTANEOUS
  Administered 2011-12-09 (×2): 3 [IU] via SUBCUTANEOUS
  Administered 2011-12-11: 4 [IU] via SUBCUTANEOUS
  Administered 2011-12-11: 3 [IU] via SUBCUTANEOUS
  Administered 2011-12-12: 4 [IU] via SUBCUTANEOUS
  Administered 2011-12-12 – 2011-12-13 (×2): 3 [IU] via SUBCUTANEOUS
  Administered 2011-12-13: 4 [IU] via SUBCUTANEOUS
  Administered 2011-12-14: 3 [IU] via SUBCUTANEOUS
  Administered 2011-12-15: 4 [IU] via SUBCUTANEOUS
  Administered 2011-12-16: 3 [IU] via SUBCUTANEOUS
  Administered 2011-12-16: 4 [IU] via SUBCUTANEOUS

## 2011-12-05 MED ORDER — INSULIN GLARGINE 100 UNIT/ML ~~LOC~~ SOLN
20.0000 [IU] | Freq: Two times a day (BID) | SUBCUTANEOUS | Status: DC
  Administered 2011-12-05 – 2011-12-11 (×14): 20 [IU] via SUBCUTANEOUS
  Filled 2011-12-05 (×2): qty 3

## 2011-12-05 MED ORDER — POTASSIUM CHLORIDE 10 MEQ/50ML IV SOLN
10.0000 meq | INTRAVENOUS | Status: DC | PRN
  Administered 2011-12-05: 10 meq via INTRAVENOUS
  Filled 2011-12-05: qty 100
  Filled 2011-12-05: qty 50
  Filled 2011-12-05: qty 150

## 2011-12-05 MED ORDER — LOPERAMIDE HCL 1 MG/5ML PO LIQD
2.0000 mg | ORAL | Status: DC | PRN
  Administered 2011-12-05: 2 mg via ORAL
  Filled 2011-12-05: qty 10

## 2011-12-05 MED ORDER — POTASSIUM CHLORIDE 10 MEQ/50ML IV SOLN
10.0000 meq | INTRAVENOUS | Status: AC
  Administered 2011-12-05 (×2): 10 meq via INTRAVENOUS

## 2011-12-05 MED ORDER — HYDROCODONE-ACETAMINOPHEN 5-325 MG PO TABS
1.0000 | ORAL_TABLET | ORAL | Status: DC | PRN
  Administered 2011-12-07 – 2011-12-17 (×13): 2 via ORAL
  Filled 2011-12-05 (×14): qty 2

## 2011-12-05 MED ORDER — INSULIN ASPART 100 UNIT/ML ~~LOC~~ SOLN
0.0000 [IU] | Freq: Every day | SUBCUTANEOUS | Status: DC
  Administered 2011-12-15: 3 [IU] via SUBCUTANEOUS

## 2011-12-05 NOTE — Progress Notes (Signed)
Subjective:  Feels well this am.  No significant dyspnea or chest pain. Improved response to IV lasix past 24 hours. Chest xray yesterday was improved. Has developed explosive diarrhea since yesterday.  Test for C.diff pending.  Objective:  Vital Signs in the last 24 hours: Temp:  [97.3 F (36.3 C)-97.8 F (36.6 C)] 97.4 F (36.3 C) (02/06 0818) Pulse Rate:  [66-85] 82  (02/06 0740) Resp:  [17-79] 19  (02/06 0740) BP: (116-159)/(61-81) 137/69 mmHg (02/06 0700) SpO2:  [86 %-100 %] 100 % (02/06 0740) Weight:  [361 lb 1.8 oz (163.8 kg)] 361 lb 1.8 oz (163.8 kg) (02/06 0001)  Intake/Output from previous day: 02/05 0701 - 02/06 0700 In: 696 [P.O.:420; I.V.:260; IV Piggyback:16] Out: 2000 [Urine:2000] Intake/Output from this shift:       . acetaminophen  1,000 mg Oral Q6H   Or  . acetaminophen (TYLENOL) oral liquid 160 mg/5 mL  975 mg Per Tube Q6H  . amiodarone  400 mg Oral BID  . antiseptic oral rinse  15 mL Mouth Rinse BID  . bisacodyl  10 mg Oral Daily   Or  . bisacodyl  10 mg Rectal Daily  . citalopram  20 mg Oral Daily  . colchicine  0.6 mg Oral BID  . docusate sodium  200 mg Oral Daily  . enoxaparin  40 mg Subcutaneous Q24H  . fenofibrate  160 mg Oral Daily  . furosemide  80 mg Intravenous BID  . insulin aspart  0-24 Units Subcutaneous Q4H  . insulin glargine  20 Units Subcutaneous BID  . metoprolol tartrate  12.5 mg Oral BID   Or  . metoprolol tartrate  12.5 mg Per Tube BID  . pantoprazole  40 mg Oral Q1200  . rosuvastatin  20 mg Oral q1800  . sodium chloride  10-40 mL Intracatheter Q12H  . sodium chloride  3 mL Intravenous Q12H  . DISCONTD: enoxaparin  30 mg Subcutaneous Q24H      . sodium chloride 20 mL/hr at 12/05/11 0600    Physical Exam: The patient appears to be in no distress.  Head and neck exam reveals that the pupils are equal and reactive.  The extraocular movements are full.  There is no scleral icterus.  Mouth and pharynx are benign.  No  lymphadenopathy.  No carotid bruits.  The jugular venous pressure is normal.  Thyroid is not enlarged or tender.  Chest is clear to percussion and auscultation.  No rales or rhonchi.  Expansion of the chest is symmetrical.  Heart reveals no abnormal lift or heave. Soft systolic murmur at base.  No gallop or rub.  The abdomen is soft and nontender.  Bowel sounds are normoactive.  There is no hepatosplenomegaly or mass.  There are no abdominal bruits.  Extremities reveal no phlebitis or edema.  Pedal pulses are good.  There is no cyanosis or clubbing.  Neurologic exam is normal strength and no lateralizing weakness.  No sensory deficits.  Integument reveals no rash  Lab Results:  Basename 12/05/11 0350 12/04/11 0508  WBC 9.8 10.1  HGB 8.4* 8.0*  PLT 374 279    Basename 12/05/11 0350 12/04/11 0508  NA 143 140  K 3.8 4.0  CL 101 101  CO2 34* 30  GLUCOSE 130* 112*  BUN 29* 34*  CREATININE 1.03 1.12   No results found for this basename: TROPONINI:2,CK,MB:2 in the last 72 hours Hepatic Function Panel No results found for this basename: PROT,ALBUMIN,AST,ALT,ALKPHOS,BILITOT,BILIDIR,IBILI in the last 72 hours No  results found for this basename: CHOL in the last 72 hours No results found for this basename: PROTIME in the last 72 hours  Imaging: Imaging results have been reviewed  Cardiac Studies:  Assessment/Plan:  Patient Active Hospital Problem List: Systolic and diastolic CHF, 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   Plan: Continue IV Lasix Diarrhea   Assessment: Possible C. Diff   Plan: Assay for C. Diff pending    LOS: 22 days    Cassell Clement 12/05/2011, 8:46 AM

## 2011-12-05 NOTE — Progress Notes (Addendum)
I will follow pt's progress to assist in determining if inpatient rehabilitation will be needed. I need O.T. Consult to assist with any authorization of rehab. Please order. Pager [phone] Patient's wife works and his Mother-in-law cannot provide patient any assistance at home. Likely will need SNF rehab for has to be completely independent to return home. I will follow his progress.

## 2011-12-05 NOTE — Progress Notes (Signed)
6 Days Post-Op Procedure(s) (LRB): AORTIC VALVE REPLACEMENT (AVR) (N/A)                    [address]            [name] [id number]          [phone]    Subjective  Diarrheax4,NSR,afebrile Objective: Vital signs in last 24 hours: Temp:  [97.3 F (36.3 C)-97.8 F (36.6 C)] 97.4 F (36.3 C) (02/06 0818) Pulse Rate:  [66-85] 82  (02/06 0740) Cardiac Rhythm:  [-] Normal sinus rhythm (02/05 2000) Resp:  [17-79] 19  (02/06 0740) BP: (116-159)/(61-81) 137/69 mmHg (02/06 0700) SpO2:  [86 %-100 %] 100 % (02/06 0740) Weight:  [361 lb 1.8 oz (163.8 kg)] 361 lb 1.8 oz (163.8 kg) (02/06 0001)  Hemodynamic parameters for last 24 hours:  stable  Intake/Output from previous day: 02/05 0701 - 02/06 0700 In: 696 [P.O.:420; I.V.:260; IV Piggyback:16] Out: 2000 [Urine:2000] Intake/Output this shift:    Lungs clear,abd soft,no murmur  Lab Results:  Basename 12/05/11 0350 12/04/11 0508  WBC 9.8 10.1  HGB 8.4* 8.0*  HCT 26.7* 24.8*  PLT 374 279   BMET:  Basename 12/05/11 0350 12/04/11 0508  NA 143 140  K 3.8 4.0  CL 101 101  CO2 34* 30  GLUCOSE 130* 112*  BUN 29* 34*  CREATININE 1.03 1.12  CALCIUM 9.3 9.1    PT/INR: No results found for this basename: LABPROT,INR in the last 72 hours ABG    Component Value Date/Time   PHART 7.422 12/01/2011 0912   HCO3 24.4* 12/01/2011 0912   TCO2 26 12/01/2011 0912   ACIDBASEDEF 3.0* 12/01/2011 0414   O2SAT 92.0 12/01/2011 0912   CBG (last 3)   Basename 12/05/11 0812 12/05/11 0351 12/05/11  GLUCAP 102* 114* 126*    Assessment/Plan: S/P Procedure(s) (LRB): AORTIC VALVE REPLACEMENT (AVR) (N/A) Check stool c. diff   LOS: 22 days    Antonio [name],Antonio [name] 12/05/2011

## 2011-12-05 NOTE — Consult Note (Signed)
Physical Medicine and Rehabilitation Consult Reason for Consult: Deconditioning after aortic valve replacement/multi-medical Referring Phsyician: Dr. Donata Clay Antonio [name]. is an 61 y.o. male.   HPI: 61 year old right-handed male multi-medical with history of severe aortic stenosis, diabetes mellitus, morbid obesity and hyperlipidemia. Patient's cardiac catheterization in December of 2011 was noted nonischemic cardiomyopathy with ejection fraction 45%. He has had admits in the past for acute on chronic systolic heart failure improved with diuresis. Admitted January 15 increasing shortness of breath and oxygen saturations 80% on room air. He has had sleep studies in the past for obstructive sleep apnea but noncompliant with CPAP. Cardiothoracic surgery followup Dr. Donata Clay with recommendations aortic valve replacement. Patient seen preop by pulmonary services for  direction on CPAP and pulmonary care. Underwent aortic valve replacement January 31 by Dr. Donata Clay. Patient was extubated on February 2 monitored closely. Postoperatively with volume overload responded nicely to aggressive diuresis he was also transfused for postoperative anemia. Currently maintained on subcutaneous Lovenox for deep vein thrombosis prophylaxis. Currently ambulating 15 feet with a rolling walker and limited endurance. Physical medicine and rehabilitation was consulted to consider inpatient rehabilitation services Review of Systems  Constitutional: Positive for malaise/fatigue.  Eyes: Negative for double vision.  Respiratory: Positive for cough and shortness of breath.   Cardiovascular: Positive for palpitations and leg swelling.  Gastrointestinal: Positive for constipation. Negative for nausea.  Genitourinary: Negative for dysuria.  Musculoskeletal: Positive for myalgias and back pain.  Neurological: Negative for dizziness and headaches.  Psychiatric/Behavioral: Negative.    Past Medical History  Diagnosis Date  .  Hepatitis   . Chronic systolic heart failure     echo 12/11: EF 45-50%, grade 1 diast dysfxn, moderately severe AS (AVA 0.9, mean 31 mmHg), mild MR;    cath 1/12:  normal cors, AVA 1.0, peak to peak gradiet for AV 31 mmHg, mod-severe AS, EF 35-40%  . 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 Hx MRSA infection     thigh abcess 11/06  . Anxiety   . Aortic stenosis     echo 12/11: AVA 0.9, mean 31, cath 1/12: AVA 1.0 with peak to peak 31 mmHg  . HTN (hypertension)   . DM2 (diabetes mellitus, type 2)   . HLD (hyperlipidemia)   . Gout   . NICM (nonischemic cardiomyopathy)    Past Surgical History  Procedure Date  . Transthoracic echocardiogram 12/05    EF: 55-65% mild to moderate AS  . Cardiovascular stress test 10/10/04    ? inf. wall defect -  . Laminectomy 10/30/83  . Aortic valve replacement 11/29/2011    Procedure: AORTIC VALVE REPLACEMENT (AVR);  Surgeon: [name], MD;  Location: [facility] OR;  Service: Open Heart Surgery;  Laterality: N/A;  with nitric oxide   Family History  Problem Relation Age of Onset  . Heart attack Father    Social History:  reports that he has never smoked. He does not have any smokeless tobacco history on file. His alcohol and drug histories not on file. Allergies:  Allergies  Allergen Reactions  . Oxycodone-Aspirin     REACTION: rash   Medications Prior to Admission  Medication Dose Route Frequency Provider Last Rate Last Dose  . 0.45 % sodium chloride infusion   Intravenous Continuous Kathlee Nations Trigt III, MD 20 mL/hr at 12/05/11 0200    . acetaminophen (TYLENOL) solution 650 mg  650 mg Per Tube NOW Ardelle Balls, PA  Or  . acetaminophen (TYLENOL) suppository 650 mg  650 mg Rectal NOW Ardelle Balls, PA   650 mg at 11/29/11 1515  . acetaminophen (TYLENOL) tablet 1,000 mg  1,000 mg Oral Q6H Ardelle Balls, PA   1,000 mg at 12/04/11 1224   Or  . acetaminophen (TYLENOL) solution 975 mg  975 mg Per Tube Q6H Ardelle Balls, PA   975 mg at 12/01/11 1610  . acetaZOLAMIDE (DIAMOX) injection 250 mg  250 mg Intravenous Once Koren Bound, MD   250 mg at 11/16/11 1704  . albumin human 5 % solution 250 mL  250 mL Intravenous Q15 min PRN Ardelle Balls, PA   250 mL at 11/29/11 1800  . aminocaproic acid (AMICAR) 10 g in sodium chloride 0.9 % 100 mL infusion   Intravenous To OR Kerin Perna III, MD 14 mL/hr at 11/29/11 0918 14 mL/hr at 11/29/11 0918  . amiodarone (NEXTERONE PREMIX) 360 mg/200 mL dextrose IV infusion  1 mg/min Intravenous Continuous Kathlee Nations Trigt III, MD 33.3 mL/hr at 11/29/11 2300 1 mg/min at 11/29/11 2300  . amiodarone (NEXTERONE) 1.8 mg/mL load via infusion 150 mg  150 mg Intravenous Once Kathlee Nations Trigt III, MD 500 mL/hr at 11/29/11 1900 150 mg at 11/29/11 1900  . amiodarone (PACERONE) tablet 400 mg  400 mg Oral BID Delight Ovens, MD   400 mg at 12/04/11 2130  . antiseptic oral rinse (BIOTENE) solution 15 mL  15 mL Mouth Rinse BID Kathlee Nations Trigt III, MD   15 mL at 12/04/11 0800  . bisacodyl (DULCOLAX) EC tablet 10 mg  10 mg Oral Daily Ardelle Balls, PA   10 mg at 12/03/11 1030   Or  . bisacodyl (DULCOLAX) suppository 10 mg  10 mg Rectal Daily Ardelle Balls, PA      . calcium chloride injection 1 g  1 g Intravenous Once Kathlee Nations Trigt III, MD   1 g at 11/29/11 1600  . cefUROXime (ZINACEF) 1.5 g in dextrose 5 % 50 mL IVPB  1.5 g Intravenous To OR Kathlee Nations Trigt III, MD   0.75 g at 11/29/11 1338  . cefUROXime (ZINACEF) 1.5 g in dextrose 5 % 50 mL IVPB  1.5 g Intravenous Q12H Ardelle Balls, PA   1.5 g at 12/01/11 0905  . chlorhexidine (PERIDEX) 0.12 % solution        15 mL at 11/29/11 2200  . citalopram (CELEXA) tablet 20 mg  20 mg Oral Daily Verne Carrow, MD   20 mg at 12/04/11 0959  . colchicine tablet 0.6 mg  0.6 mg Oral BID Ardelle Balls, PA   0.6 mg at 12/04/11 2130  . colchicine tablet 1.2 mg  1.2 mg Oral Once Darrol Jump, PA   1.2 mg  at 11/15/11 2330  . desmopressin (DDAVP) 20 mcg in sodium chloride 0.9 % 50 mL IVPB  20 mcg Intravenous Once Kathlee Nations Trigt III, MD   20 mcg at 11/29/11 1800  . dexmedetomidine (PRECEDEX) 200 mcg in sodium chloride 0.9 % 50 mL infusion  0.4-1.2 mcg/kg/hr Intravenous Once Kathlee Nations Trigt III, MD 27.7 mL/hr at 11/29/11 1515 0.7 mcg/kg/hr at 11/29/11 1515  . dexmedetomidine (PRECEDEX) 400 mcg in sodium chloride 0.9 % 100 mL infusion  0.1-0.7 mcg/kg/hr Intravenous To OR Kathlee Nations Trigt III, MD 28 mL/hr at 11/29/11 1012 0.7 mcg/kg/hr at 11/29/11 1012  . dextrose 50 % solution  50 mL at 11/17/11 0743  . diazepam (VALIUM) tablet 5 mg  5 mg Oral On Call Verne Carrow, MD   5 mg at 11/28/11 1454  . docusate sodium (COLACE) capsule 200 mg  200 mg Oral Daily Ardelle Balls, PA   200 mg at 12/03/11 1029  . enoxaparin (LOVENOX) injection 40 mg  40 mg Subcutaneous Q24H Kathlee Nations Trigt III, MD      . enoxaparin (LOVENOX) injection 80 mg  80 mg Subcutaneous QHS Kathlee Nations Trigt III, MD   80 mg at 11/27/11 2246  . famotidine (PEPCID) IVPB 20 mg  20 mg Intravenous Q12H Ardelle Balls, PA   20 mg at 11/29/11 2154  . fenofibrate tablet 160 mg  160 mg Oral Daily Verne Carrow, MD   160 mg at 12/04/11 0959  . fentaNYL (SUBLIMAZE) 0.05 MG/ML injection           . furosemide (LASIX) injection 40 mg  40 mg Intravenous Once Kathlee Nations Trigt III, MD   40 mg at 11/30/11 0920  . furosemide (LASIX) injection 40 mg  40 mg Intravenous Once Kathlee Nations Trigt III, MD   40 mg at 12/01/11 0141  . furosemide (LASIX) injection 80 mg  80 mg Intravenous BID Delight Ovens, MD   80 mg at 12/04/11 1722  . heparin 2-0.9 UNIT/ML-% infusion           . insulin aspart (novoLOG) injection 0-24 Units  0-24 Units Subcutaneous Q4H Kerin Perna III, MD   2 Units at 12/05/11 0054  . insulin glargine (LANTUS) injection 20 Units  20 Units Subcutaneous BID Kathlee Nations Trigt III, MD   20 Units at 12/02/11 2215  . insulin  regular (NOVOLIN R,HUMULIN R) 1 Units/mL in sodium chloride 0.9 % 100 mL infusion   Intravenous To OR Kathlee Nations Trigt III, MD 4.9 mL/hr at 11/29/11 1412 4.9 Units/hr at 11/29/11 1412  . lactated ringers infusion 500 mL  500 mL Intravenous Once PRN Ardelle Balls, PA      . lidocaine (XYLOCAINE) 1 % injection           . magnesium sulfate IVPB 4 g 100 mL  4 g Intravenous Once Ardelle Balls, PA   4 g at 11/29/11 1600  . methylPREDNISolone sodium succinate (SOLU-MEDROL) 125 MG injection 80 mg  80 mg Intravenous Once Kathlee Nations Trigt III, MD   80 mg at 11/30/11 1627  . metoprolol (LOPRESSOR) injection 2.5-5 mg  2.5-5 mg Intravenous Q2H PRN Ardelle Balls, PA   1 mg at 11/29/11 1650  . metoprolol tartrate (LOPRESSOR) tablet 12.5 mg  12.5 mg Oral BID Ardelle Balls, PA   12.5 mg at 12/04/11 2130   Or  . metoprolol tartrate (LOPRESSOR) 25 mg/10 mL oral suspension 12.5 mg  12.5 mg Per Tube BID Ardelle Balls, PA   12.5 mg at 12/02/11 2215  . midazolam (VERSED) 2 MG/2ML injection           . morphine 2 MG/ML injection 1-4 mg  1-4 mg Intravenous Q1H PRN Ardelle Balls, PA   4 mg at 11/29/11 2059  . nitroGLYCERIN (NTG ON-CALL) 0.2 mg/mL injection           . nitroGLYCERIN 0.2 mg/mL in dextrose 5 % infusion  2-200 mcg/min Intravenous To OR Kerin Perna III, MD   5 mcg/min at 11/29/11 [phone]  . ondansetron (ZOFRAN) injection 4 mg  4 mg Intravenous Q6H PRN  Ardelle Balls, PA   4 mg at 12/05/11 0336  . pantoprazole (PROTONIX) EC tablet 40 mg  40 mg Oral Q1200 Ardelle Balls, PA   40 mg at 12/04/11 1224  . perflutren protein A microspheres (OPTISON) injection 2 mL  2 mL Intravenous Once [name], MD   2 mL at 11/13/11 1300  . potassium chloride 10 mEq in 50 mL *CENTRAL LINE* IVPB  10 mEq Intravenous Q1 Hr x 3 Ardelle Balls, PA   10 mEq at 11/29/11 1953  . potassium chloride 10 mEq in 50 mL *CENTRAL LINE* IVPB  10 mEq Intravenous Q1 Hr x 3 Kathlee Nations Trigt  III, MD   10 mEq at 11/30/11 0719  . rosuvastatin (CRESTOR) tablet 20 mg  20 mg Oral q1800 Verne Carrow, MD   20 mg at 12/04/11 1800  . sodium bicarbonate 1 mEq/mL injection        50 mEq at 11/29/11 1600  . sodium bicarbonate injection 25 mEq  25 mEq Intravenous Once Kathlee Nations Trigt III, MD      . sodium bicarbonate injection 50 mEq  50 mEq Intravenous Once Kathlee Nations Trigt III, MD   50 mEq at 11/30/11 1954  . sodium chloride 0.9 % injection 10-40 mL  10-40 mL Intracatheter Q12H Kerin Perna III, MD   10 mL at 12/04/11 2230  . sodium chloride 0.9 % injection 10-40 mL  10-40 mL Intracatheter PRN Kathlee Nations Trigt III, MD      . sodium chloride 0.9 % injection 3 mL  3 mL Intravenous Q12H Donielle Margaretann Loveless, PA      . sodium chloride 0.9 % injection 3 mL  3 mL Intravenous PRN Ardelle Balls, PA      . traMADol Janean Sark) tablet 50-100 mg  50-100 mg Oral Q4H PRN Ardelle Balls, PA   100 mg at 12/03/11 1837  . vancomycin (VANCOCIN) 1,000 mg in sodium chloride 0.9 % 100 mL IVPB  1,000 mg Intravenous Once Ardelle Balls, PA   1,000 mg at 11/29/11 2100  . vancomycin (VANCOCIN) 1,500 mg in sodium chloride 0.9 % 250 mL IVPB  1,500 mg Intravenous To OR Kathlee Nations Trigt III, MD   1,500 mg at 11/29/11 0804  . white petrolatum (VASELINE) gel        1 application at 11/14/11 0000  . DISCONTD: 0.45 % sodium chloride infusion   Intravenous Continuous Ardelle Balls, PA 20 mL/hr at 11/29/11 1515    . DISCONTD: 0.9 %  sodium chloride infusion  250 mL Intravenous PRN Darrol Jump, PA      . DISCONTD: 0.9 %  sodium chloride infusion  250 mL Intravenous PRN Verne Carrow, MD      . DISCONTD: 0.9 %  sodium chloride infusion   Intravenous Continuous Ardelle Balls, PA 10 mL/hr at 11/30/11 0200    . DISCONTD: 0.9 %  sodium chloride infusion  250 mL Intravenous Continuous Ardelle Balls, PA      . DISCONTD: acetaminophen (TYLENOL) tablet 650 mg  650 mg Oral Q4H PRN  Darrol Jump, PA   650 mg at 11/18/11 1052  . DISCONTD: ALPRAZolam Prudy Feeler) tablet 0.25 mg  0.25 mg Oral BID PRN Darrol Jump, PA   0.25 mg at 11/17/11 0001  . DISCONTD: aminocaproic acid (AMICAR) 5 g in sodium chloride 0.9 % 50 mL IVPB  5 g Intravenous To OR Verne Carrow, MD      .  DISCONTD: amiodarone (NEXTERONE PREMIX) 360 mg/200 mL dextrose IV infusion  0.5 mg/min Intravenous Continuous Kathlee Nations Trigt III, MD 16.7 mL/hr at 12/02/11 0245 0.5 mg/min at 12/02/11 0245  . DISCONTD: antiseptic oral rinse (BIOTENE) solution 15 mL  15 mL Mouth Rinse QID [facility] [place], PHARMD   15 mL at 12/01/11 0400  . DISCONTD: aspirin EC tablet 81 mg  81 mg Oral Daily Verne Carrow, MD   81 mg at 11/28/11 1034  . DISCONTD: bisacodyl (DULCOLAX) EC tablet 5 mg  5 mg Oral Once Kathlee Nations Trigt III, MD      . DISCONTD: bisacodyl (DULCOLAX) EC tablet 5 mg  5 mg Oral Once Verne Carrow, MD      . DISCONTD: carvedilol (COREG) tablet 12.5 mg  12.5 mg Oral BID WC Verne Carrow, MD   12.5 mg at 11/28/11 1714  . DISCONTD: carvedilol (COREG) tablet 6.25 mg  6.25 mg Oral BID WC Verne Carrow, MD   6.25 mg at 11/21/11 1610  . DISCONTD: cefUROXime (ZINACEF) 1.5 g in dextrose 5 % 50 mL IVPB  1.5 g Intravenous To OR Kathlee Nations Trigt III, MD      . DISCONTD: cefUROXime (ZINACEF) 750 mg in dextrose 5 % 50 mL IVPB  750 mg Intravenous To OR Kathlee Nations Trigt III, MD      . DISCONTD: cefUROXime (ZINACEF) 750 mg in dextrose 5 % 50 mL IVPB  750 mg Intravenous To OR Verne Carrow, MD      . DISCONTD: cefUROXime (ZINACEF) 750 mg in dextrose 5 % 50 mL IVPB  750 mg Intravenous To OR Kathlee Nations Trigt III, MD      . DISCONTD: chlorhexidine (HIBICLENS) 4 % liquid 4 application  60 mL Topical Once Kathlee Nations Trigt III, MD      . DISCONTD: chlorhexidine (HIBICLENS) 4 % liquid 4 application  60 mL Topical Once Kathlee Nations Trigt III, MD      . DISCONTD: chlorhexidine (HIBICLENS) 4 % liquid 4  application  60 mL Topical Once Kathlee Nations Trigt III, MD      . DISCONTD: chlorhexidine (HIBICLENS) 4 % liquid 4 application  60 mL Topical Once Verne Carrow, MD      . DISCONTD: chlorhexidine (HIBICLENS) 4 % liquid 4 application  60 mL Topical Once Verne Carrow, MD      . DISCONTD: chlorhexidine (HIBICLENS) 4 % liquid 4 application  60 mL Topical Once Verne Carrow, MD      . DISCONTD: chlorhexidine (HIBICLENS) 4 % liquid 4 application  60 mL Topical Once Verne Carrow, MD      . DISCONTD: chlorhexidine (PERIDEX) 0.12 % solution 15 mL  15 mL Mouth/Throat BID [facility] [place], PHARMD   15 mL at 12/01/11 0941  . DISCONTD: colchicine tablet 0.6 mg  0.6 mg Oral BID Darrol Jump, PA   0.6 mg at 11/28/11 2300  . DISCONTD: dexmedetomidine (PRECEDEX) 200 mcg in sodium chloride 0.9 % 50 mL infusion  0.4-1.2 mcg/kg/hr Intravenous Titrated Verne Carrow, MD      . DISCONTD: dexmedetomidine (PRECEDEX) 200 mcg in sodium chloride 0.9 % 50 mL infusion  0.1-0.7 mcg/kg/hr Intravenous Continuous Ardelle Balls, PA 27.7 mL/hr at 11/29/11 1515 0.7 mcg/kg/hr at 11/29/11 1515  . DISCONTD: dexmedetomidine (PRECEDEX) 400 mcg in sodium chloride 0.9 % 100 mL infusion  0.4-1.2 mcg/kg/hr Intravenous Titrated Herby Abraham, PHARMD   0.4 mcg/kg/hr at 12/01/11 1000  . DISCONTD: diazepam (VALIUM) tablet 5 mg  5 mg Oral  On Call Verne Carrow, MD      . DISCONTD: digoxin Margit Banda) injection 0.25 mg  0.25 mg Intravenous Daily Kathlee Nations Trigt III, MD   0.25 mg at 12/02/11 1139  . DISCONTD: DOPamine (INTROPIN) 800 mg in dextrose 5 % 250 mL infusion  2-20 mcg/kg/min Intravenous To OR Kathlee Nations Trigt III, MD 9 mL/hr at 11/29/11 1256 3 mcg/kg/min at 11/29/11 1256  . DISCONTD: DOPamine (INTROPIN) 800 mg in dextrose 5 % 250 mL infusion  0-5 mcg/kg/min Intravenous Continuous Ardelle Balls, PA 8.9 mL/hr at 11/29/11 1515 3 mcg/kg/min at 11/29/11 1515  . DISCONTD: DOPamine  (INTROPIN) 800 mg in dextrose 5 % 250 mL infusion  2.5 mcg/kg/min Intravenous Titrated Delight Ovens, MD   3 mcg/kg/min at 12/02/11 0006  . DISCONTD: enoxaparin (LOVENOX) injection 30 mg  30 mg Subcutaneous Q24H Delight Ovens, MD   30 mg at 12/04/11 1000  . DISCONTD: enoxaparin (LOVENOX) injection 40 mg  40 mg Subcutaneous QHS Darrol Jump, PA   40 mg at 11/14/11 0012  . DISCONTD: EPINEPHrine (ADRENALIN) 4,000 mcg in dextrose 5 % 250 mL infusion  0.5-20 mcg/min Intravenous To OR Kathlee Nations Trigt III, MD      . DISCONTD: exenatide (BYETTA) injection 5 mcg  5 mcg Subcutaneous BID WC Verne Carrow, MD   5 mcg at 11/27/11 1659  . DISCONTD: feeding supplement (PRO-STAT SUGAR FREE 64) liquid 30 mL  30 mL Oral BID AC Jeoffrey Massed, RD   30 mL at 11/16/11 1202  . DISCONTD: furosemide (LASIX) injection 40 mg  40 mg Intravenous BID Delight Ovens, MD   40 mg at 12/01/11 1839  . DISCONTD: furosemide (LASIX) injection 80 mg  80 mg Intravenous Q8H Darrol Jump, PA   80 mg at 11/16/11 0610  . DISCONTD: furosemide (LASIX) tablet 20 mg  20 mg Oral Daily Rollene Rotunda, MD   20 mg at 11/20/11 1009  . DISCONTD: furosemide (LASIX) tablet 40 mg  40 mg Oral BID Verne Carrow, MD   40 mg at 11/28/11 1900  . DISCONTD: glimepiride (AMARYL) tablet 4 mg  4 mg Oral BID WC Verne Carrow, MD   4 mg at 11/28/11 1800  . DISCONTD: hemostatic agents    PRN [name], MD   2 application at 11/29/11 [phone]  . DISCONTD: hemostatic agents    PRN Kerin Perna III, MD   1 application at 11/29/11 1241  . DISCONTD: hemostatic agents    PRN Kerin Perna III, MD   2 application at 11/29/11 1328  . DISCONTD: heparin 6,000 Units in sodium chloride irrigation 0.9 % 500 mL irrigation    PRN Kathlee Nations Trigt III, MD      . DISCONTD: HYDROcodone-acetaminophen (NORCO) 5-325 MG per tablet 1-2 tablet  1-2 tablet Oral Q6H PRN Darrol Jump, PA   2 tablet at 11/24/11 1209  . DISCONTD: insulin  aspart (novoLOG) injection 0-15 Units  0-15 Units Subcutaneous TID WC Darrol Jump, PA   3 Units at 11/27/11 1219  . DISCONTD: insulin aspart (novoLOG) injection 0-24 Units  0-24 Units Subcutaneous Q2H Kathlee Nations Trigt III, MD      . DISCONTD: insulin aspart (novoLOG) injection 0-24 Units  0-24 Units Subcutaneous Q4H Kathlee Nations Trigt III, MD      . DISCONTD: insulin aspart (novoLOG) injection 0-24 Units  0-24 Units Subcutaneous Q4H Kerin Perna III, MD   8 Units at 12/01/11 0031  .  DISCONTD: insulin aspart (novoLOG) injection 0-24 Units  0-24 Units Subcutaneous Q4H Delight Ovens, MD   4 Units at 12/01/11 1953  . DISCONTD: insulin aspart (novoLOG) injection 0-5 Units  0-5 Units Subcutaneous QHS Darrol Jump, PA      . DISCONTD: insulin glargine (LANTUS) injection 15 Units  15 Units Subcutaneous QHS Ok Anis, NP   15 Units at 11/28/11 2301  . DISCONTD: insulin glargine (LANTUS) injection 20 Units  20 Units Subcutaneous [id number] Delight Ovens, MD      . DISCONTD: insulin glargine (LANTUS) injection 25 Units  25 Units Subcutaneous QHS Rollene Rotunda, MD   25 Units at 11/25/11 2250  . DISCONTD: insulin glargine (LANTUS) injection 50 Units  50 Units Subcutaneous QHS Darrol Jump, PA   50 Units at 11/16/11 2242  . DISCONTD: insulin glargine (LANTUS) injection 60 Units  60 Units Subcutaneous QHS Verne Carrow, MD   60 Units at 11/15/11 2148  . DISCONTD: insulin regular (NOVOLIN R,HUMULIN R) 1 Units/mL in sodium chloride 0.9 % 100 mL infusion   Intravenous Continuous Ardelle Balls, PA   11.6 Units at 12/01/11 1610  . DISCONTD: insulin regular bolus via infusion 0-10 Units  0-10 Units Intravenous TID WC Donielle Margaretann Loveless, PA      . DISCONTD: lactated ringers infusion   Intravenous Continuous Ardelle Balls, PA 20 mL/hr at 12/02/11 2000    . DISCONTD: lisinopril (PRINIVIL,ZESTRIL) tablet 20 mg  20 mg Oral Daily Verne Carrow, MD   20 mg at 11/15/11  0923  . DISCONTD: lisinopril (PRINIVIL,ZESTRIL) tablet 40 mg  40 mg Oral Daily Verne Carrow, MD   40 mg at 11/14/11 1508  . DISCONTD: magnesium sulfate (IV Push/IM) injection 40 mEq  40 mEq Other To OR Kathlee Nations Trigt III, MD      . DISCONTD: metFORMIN (GLUCOPHAGE) tablet 1,000 mg  1,000 mg Oral BID WC Verne Carrow, MD   1,000 mg at 11/15/11 1732  . DISCONTD: metoprolol tartrate (LOPRESSOR) tablet 12.5 mg  12.5 mg Oral Once Kathlee Nations Trigt III, MD      . DISCONTD: metoprolol tartrate (LOPRESSOR) tablet 12.5 mg  12.5 mg Oral Once Verne Carrow, MD      . DISCONTD: midazolam (VERSED) injection 2 mg  2 mg Intravenous Q1H PRN Ardelle Balls, PA   2 mg at 11/30/11 1900  . DISCONTD: milrinone (PRIMACOR) infusion 200 mcg/mL (0.2 mg/mL)  0.25 mcg/kg/min (Ideal) Intravenous Continuous Kathlee Nations Trigt III, MD      . DISCONTD: milrinone [facility] infusion 200 mcg/mL (0.2 mg/mL)  0.125 mcg/kg/min (Order-Specific) Intravenous Continuous Delight Ovens, MD   0.125 mcg/kg/min at 12/01/11 1400  . DISCONTD: morphine 4 MG/ML injection 2-5 mg  2-5 mg Intravenous Q1H PRN Ardelle Balls, PA   4 mg at 12/03/11 0929  . DISCONTD: nitroGLYCERIN 0.2 mg/mL in dextrose 5 % infusion  0-100 mcg/min Intravenous Continuous Ardelle Balls, PA      . DISCONTD: nitroglycerin/verapamil/heparin/sodium bicarbonate solution irrigation for artery spasm   Irrigation To OR Kathlee Nations Trigt III, MD      . DISCONTD: ondansetron (ZOFRAN) injection 4 mg  4 mg Intravenous Q6H PRN Darrol Jump, PA      . DISCONTD: perflutren protein A microspheres (OPTISON) injection 2 mL  2 mL Intravenous Once [name], MD      . DISCONTD: phenylephrine (NEO-SYNEPHRINE) 20,000 mcg in dextrose 5 % 250 mL infusion  30-200 mcg/min Intravenous To OR Theron Arista  Donata Clay III, MD 13.5 mL/hr at 11/29/11 1425 18 mcg/min at 11/29/11 1425  . DISCONTD: phenylephrine (NEO-SYNEPHRINE) 20,000 mcg in dextrose 5 % 250 mL infusion   0-100 mcg/min Intravenous Continuous Ardelle Balls, PA   5 mcg/min at 11/30/11 2250  . DISCONTD: polyethylene glycol (MIRALAX / GLYCOLAX) packet 17 g  17 g Oral Daily PRN Verne Carrow, MD   17 g at 11/15/11 0921  . DISCONTD: potassium chloride injection 80 mEq  80 mEq Other To OR Kathlee Nations Trigt III, MD      . DISCONTD: potassium chloride SA (K-DUR,KLOR-CON) CR tablet 40 mEq  40 mEq Oral Daily Verne Carrow, MD   40 mEq at 11/28/11 1038  . DISCONTD: sodium chloride 0.9 % injection 3 mL  3 mL Intravenous Q12H Darrol Jump, PA   3 mL at 11/19/11 2209  . DISCONTD: sodium chloride 0.9 % injection 3 mL  3 mL Intravenous PRN Darrol Jump, PA      . DISCONTD: sodium chloride 0.9 % injection 3 mL  3 mL Intravenous Q12H Verne Carrow, MD   3 mL at 11/28/11 1041  . DISCONTD: sodium chloride 0.9 % injection 3 mL  3 mL Intravenous PRN Verne Carrow, MD      . DISCONTD: sodium chloride irrigation 0.9 %    PRN Kathlee Nations Trigt III, MD   9,000 mL at 11/29/11 0937  . DISCONTD: Surgifoam 1 Gm with Thrombin 5,000 units (5 ml) topical solution    PRN Kathlee Nations Trigt III, MD      . DISCONTD: temazepam (RESTORIL) capsule 15 mg  15 mg Oral Once PRN Kathlee Nations Trigt III, MD      . DISCONTD: temazepam (RESTORIL) capsule 15 mg  15 mg Oral Once PRN Verne Carrow, MD      . DISCONTD: vancomycin (VANCOCIN) 1,500 mg in sodium chloride 0.9 % 250 mL IVPB  1,500 mg Intravenous To OR Kathlee Nations Trigt III, MD      . DISCONTD: zolpidem (AMBIEN) tablet 5 mg  5 mg Oral QHS PRN Darrol Jump, PA   5 mg at 11/18/11 0108   Medications Prior to Admission  Medication Sig Dispense Refill  . aspirin 81 MG tablet Take 81 mg by mouth daily.        Marland Kitchen atorvastatin (LIPITOR) 40 MG tablet Take 1 tablet (40 mg total) by mouth daily.  90 tablet  3  . carvedilol (COREG) 6.25 MG tablet Take 1 tablet (6.25 mg total) by mouth 2 (two) times daily with a meal.  180 tablet  3  . fenofibrate (TRICOR)  145 MG tablet Take 1 tablet (145 mg total) by mouth daily.  90 tablet  3  . furosemide (LASIX) 80 MG tablet Take 1 tablet (80 mg total) by mouth 2 (two) times daily.  180 tablet  3  . glimepiride (AMARYL) 4 MG tablet Take 4 mg by mouth 2 (two) times daily.        . indomethacin (INDOCIN) 50 MG capsule Take 50 mg by mouth 3 (three) times daily as needed. For gout      . insulin glargine (LANTUS) 100 UNIT/ML injection Inject 60 Units into the skin at bedtime.       Marland Kitchen lisinopril (PRINIVIL,ZESTRIL) 40 MG tablet Take 1 tablet (40 mg total) by mouth daily.  90 tablet  3  . metFORMIN (GLUCOPHAGE) 1000 MG tablet Take 1,000 mg by mouth 2 (two) times daily with meals.  Home: Home Living Lives With: Spouse Type of Home: House Home Layout: Able to live on main level with bedroom/bathroom;Two level Alternate Level Stairs-Rails: Right Alternate Level Stairs-Number of Steps: 4 Home Access: Level entry Bathroom Shower/Tub: Health visitor: Standard Home Adaptive Equipment: Straight cane;Walker - rolling  Functional History: Prior Function Level of Independence: Independent with basic ADLs;Independent with gait Driving: Yes Functional Status:  Mobility: Bed Mobility Bed Mobility: No Supine to Sit: 1: +2 Total assist;HOB elevated (Comment degrees);Patient percentage (comment) (HOB at 45 degrees, Patient = 60%) Supine to Sit Details (indicate cue type and reason): Reviewed with patient sternal precautions.  Instructions on transition to sitting provided. Sitting - Scoot to Edge of Bed: 3: Mod assist Sitting - Scoot to Edge of Bed Details (indicate cue type and reason): Instructed patient to scoot one hip forward at a time Transfers Transfers: Yes Sit to Stand: 1: +2 Total assist;Other (comment) (from bari WC) Sit to Stand Details (indicate cue type and reason): 2 trials with cueing for hands on thighs, assist for anterior translation. During 2nd trial after amb required 3  attempts before pt fully engaged muscles to assist with successful stand Stand to Sit: 4: Min assist Stand to Sit Details: cueing for hand placement and assist to control descent to bari WC Stand Pivot Transfers: 1: +2 Total assist;Patient percentage (comment) (Patient = 70%) Stand Pivot Transfer Details (indicate cue type and reason): Cues for technique.  Support provided to maintain knee extension during stance. Ambulation/Gait Ambulation/Gait: Yes Ambulation/Gait Assistance: 6: Modified independent (Device/Increase time) Ambulation/Gait Assistance Details (indicate cue type and reason): Pt with wide BOS and slow gait with short strides. Amb limited by pt dizziness Ambulation Distance (Feet): 15 Feet (7' then 15' ) Assistive device: Rolling walker Gait Pattern: Decreased stride length    ADL:    Cognition: Cognition Arousal/Alertness: Awake/alert Orientation Level: Oriented X4 Cognition Arousal/Alertness: Awake/alert Overall Cognitive Status: Appears within functional limits for tasks assessed Orientation Level: Oriented X4  Blood pressure 152/74, pulse 77, temperature 97.4 F (36.3 C), temperature source Oral, resp. rate 19, height 6\' 1"  (1.854 m), weight 163.8 kg (361 lb 1.8 oz), SpO2 98.00%. Physical Exam  Constitutional: He is oriented to person, place, and time.       Obese  HENT:  Head: Normocephalic.  Neck: Normal range of motion. Neck supple. No thyromegaly present.  Cardiovascular: Regular rhythm.   Pulmonary/Chest: Breath sounds normal. He has no wheezes.  Abdominal: He exhibits no distension. There is no tenderness.  Musculoskeletal:       +2 edema lower extremities, UE 1+ edema distally.   Neurological: He is alert and oriented to person, place, and time.       UES 3/5 prox to 4/5 distal.  LES 3/5. Decreased pp and LT distally.  DTR's 1+.  Cognitively intact.  Speech intelligible  Skin: Skin is warm and dry.       Multiple abrasions noted.  Chest incision  intact  Psychiatric:       Mood is flat but appropriate    Results for orders placed during the hospital encounter of 11/13/11 (from the past 24 hour(s))  GLUCOSE, CAPILLARY     Status: Abnormal   Collection Time   12/04/11  7:53 AM      Component Value Range   Glucose-Capillary 116 (*) 70 - 99 (mg/dL)   Comment 1 Documented in Chart     Comment 2 Notify RN    GLUCOSE, CAPILLARY     Status:  Abnormal   Collection Time   12/04/11 12:22 PM      Component Value Range   Glucose-Capillary 131 (*) 70 - 99 (mg/dL)   Comment 1 Documented in Chart     Comment 2 Notify RN    GLUCOSE, CAPILLARY     Status: Abnormal   Collection Time   12/04/11  4:46 PM      Component Value Range   Glucose-Capillary 133 (*) 70 - 99 (mg/dL)   Comment 1 Notify RN    GLUCOSE, CAPILLARY     Status: Abnormal   Collection Time   12/04/11  9:05 PM      Component Value Range   Glucose-Capillary 121 (*) 70 - 99 (mg/dL)   Comment 1 Documented in Chart     Comment 2 Notify RN    GLUCOSE, CAPILLARY     Status: Abnormal   Collection Time   12/05/11 12:00 AM      Component Value Range   Glucose-Capillary 126 (*) 70 - 99 (mg/dL)  CBC     Status: Abnormal   Collection Time   12/05/11  3:50 AM      Component Value Range   WBC 9.8  4.0 - 10.5 (K/uL)   RBC 3.13 (*) 4.22 - 5.81 (MIL/uL)   Hemoglobin 8.4 (*) 13.0 - 17.0 (g/dL)   HCT 45.4 (*) 09.8 - 52.0 (%)   MCV 85.3  78.0 - 100.0 (fL)   MCH 26.8  26.0 - 34.0 (pg)   MCHC 31.5  30.0 - 36.0 (g/dL)   RDW 11.9  14.7 - 82.9 (%)   Platelets 374  150 - 400 (K/uL)  BASIC METABOLIC PANEL     Status: Abnormal   Collection Time   12/05/11  3:50 AM      Component Value Range   Sodium 143  135 - 145 (mEq/L)   Potassium 3.8  3.5 - 5.1 (mEq/L)   Chloride 101  96 - 112 (mEq/L)   CO2 34 (*) 19 - 32 (mEq/L)   Glucose, Bld 130 (*) 70 - 99 (mg/dL)   BUN 29 (*) 6 - 23 (mg/dL)   Creatinine, Ser 5.62  0.50 - 1.35 (mg/dL)   Calcium 9.3  8.4 - 13.0 (mg/dL)   GFR calc non Af Amer 76 (*) >90  (mL/min)   GFR calc Af Amer 89 (*) >90 (mL/min)  GLUCOSE, CAPILLARY     Status: Abnormal   Collection Time   12/05/11  3:51 AM      Component Value Range   Glucose-Capillary 114 (*) 70 - 99 (mg/dL)   Dg Chest Port 1v Same Day  12/03/2011  *RADIOLOGY REPORT*  Clinical Data: Status post aortic valve replacement  CHEST - 1 VIEW  Comparison: 12/02/2011  Findings: Overall aeration has improved bilaterally with areas of subsegmental atelectasis remaining in the perihilar regions and lower lung zones.  There is no evidence of pneumothorax or overt pulmonary edema.  Mediastinal contours are stable.  IMPRESSION: Improved aeration.  Original Report Authenticated By: Reola Calkins, M.D.    Assessment/Plan: Diagnosis: AS s/p AVR with deconditioning.  Hx of gout and chronic low back pain as well as obesity. 1. Does the need for close, 24 hr/day medical supervision in concert with the patient's rehab needs make it unreasonable for this patient to be served in a less intensive setting? Yes 2. Co-Morbidities requiring supervision/potential complications: dm, AS, CHF, HTN 3. Due to bladder management, bowel management, safety, skin/wound care, disease management, medication administration,  pain management and patient education, does the patient require 24 hr/day rehab nursing? Potentially 4. Does the patient require coordinated care of a physician, rehab nurse, PT (1-2 hrs/day, 5 days/week) and OT (1-2 hrs/day, 5 days/week) to address physical and functional deficits in the context of the above medical diagnosis(es)? Potentially Addressing deficits in the following areas: balance, endurance, locomotion, strength, transferring, bowel/bladder control, bathing, dressing, feeding, grooming, toileting and psychosocial support 5. Can the patient actively participate in an intensive therapy program of at least 3 hrs of therapy per day at least 5 days per week? Yes 6. The potential for patient to make measurable gains  while on inpatient rehab is fair 7. Anticipated functional outcomes upon discharge from inpatients are mod I to sup PT, mod I to min assist OT,  8. Estimated rehab length of stay to reach the above functional goals is: 1 week if needed 9. Does the patient have adequate social supports to accommodate these discharge functional goals? Yes 10. Anticipated D/C setting: Home 11. Anticipated post D/C treatments: HH therapy 12. Overall Rehab/Functional Prognosis: excellent  RECOMMENDATIONS: This patient's condition is appropriate for continued rehabilitative care in the following setting: HH vs brief CIR admit Patient has agreed to participate in recommended program. Yes Note that insurance prior authorization may be required for reimbursement for recommended care.  Comment: Pt is progressing.  Course complicated by diarrhea overnight. Will follow for physical progress while he still requires monitoring here.  He may improve functionally to the point where he could go home before he's medically ready to come off the monitor.  He does have stairs to navigate within his home which will be a physical challenge, however. Rehab RN to follow up. He's willing to do whatever it takes to "get home"   Antonio Broad, MD 12/05/2011

## 2011-12-05 NOTE — Progress Notes (Signed)
Physical Therapy Treatment Patient Details Name: Antonio [name]. MRN: [id number] DOB: 12/04/1950 Today's Date: 12/05/2011  PT Assessment/Plan  PT - Assessment/Plan Comments on Treatment Session: Did better today. Still limited by dizziness but able to ambulate a little further today. Pt very talkative and really needs encouragement and redirection for participation.  PT Plan: Discharge plan remains appropriate Follow Up Recommendations: Skilled nursing facility Equipment Recommended: Defer to next venue PT Goals  Acute Rehab PT Goals PT Goal: Sit to Stand - Progress: Progressing toward goal PT Transfer Goal: Bed to Chair/Chair to Bed - Progress: Progressing toward goal PT Goal: Ambulate - Progress: Progressing toward goal Additional Goals PT Goal: Additional Goal #1 - Progress: Progressing toward goal  PT Treatment Precautions/Restrictions  Precautions Precautions: Sternal;Fall Required Braces or Orthoses: No Restrictions Weight Bearing Restrictions: No Mobility (including Balance) Bed Mobility Bed Mobility: No (pt upright in recliner) Transfers Sit to Stand: 1: +2 Total assist (65%) Sit to Stand Details (indicate cue type and reason): cues for hand placement as well as anterior translation and follow through to stand; cueing to rock A/P to assist with initiating  Stand to Sit: 4: Min assist Stand to Sit Details: cues for safety and to control descent Ambulation/Gait Ambulation/Gait: Yes Ambulation/Gait Assistance: 4: Min assist Ambulation/Gait Assistance Details (indicate cue type and reason): cues for deep and slow breathing; pt easily distracted; became "light headed" about halfway through amb.; cueing for upright posture and attention/focus to task Ambulation Distance (Feet): 40 Feet Assistive device: Rolling walker Gait Pattern: Trunk flexed (wide BOS)    Exercise    End of Session PT - End of Session Equipment Utilized During Treatment: Gait belt Activity  Tolerance: Patient tolerated treatment well;Patient limited by fatigue Patient left: in chair Nurse Communication: Mobility status for ambulation;Mobility status for transfers General Behavior During Session: [facility] for tasks performed Cognition: [facility] for tasks performed (distractable needs redirection)  [facility] HELEN 12/05/2011, 12:58 PM

## 2011-12-05 NOTE — Progress Notes (Signed)
Patient discussed at the Long Length of Stay Antonio [name] Weeks 12/05/2011  

## 2011-12-06 ENCOUNTER — Inpatient Hospital Stay (HOSPITAL_COMMUNITY): Payer: BC Managed Care – PPO

## 2011-12-06 DIAGNOSIS — IMO0001 Reserved for inherently not codable concepts without codable children: Secondary | ICD-10-CM

## 2011-12-06 DIAGNOSIS — E1165 Type 2 diabetes mellitus with hyperglycemia: Secondary | ICD-10-CM

## 2011-12-06 LAB — BASIC METABOLIC PANEL
BUN: 22 mg/dL (ref 6–23)
CO2: 37 mEq/L — ABNORMAL HIGH (ref 19–32)
Calcium: 9 mg/dL (ref 8.4–10.5)
Chloride: 98 mEq/L (ref 96–112)
Creatinine, Ser: 1.01 mg/dL (ref 0.50–1.35)
GFR calc Af Amer: >90 mL/min (ref >90)
GFR calc non Af Amer: 78 mL/min — ABNORMAL LOW (ref >90)
Glucose, Bld: 116 mg/dL — ABNORMAL HIGH (ref 70–99)
Potassium: 3.5 mEq/L (ref 3.5–5.1)
Sodium: 142 mEq/L (ref 135–145)

## 2011-12-06 LAB — CBC
HCT: 26.2 % — ABNORMAL LOW (ref 39.0–52.0)
Hemoglobin: 8.2 g/dL — ABNORMAL LOW (ref 13.0–17.0)
MCH: 27.1 pg (ref 26.0–34.0)
MCHC: 31.3 g/dL (ref 30.0–36.0)
MCV: 86.5 fL (ref 78.0–100.0)
Platelets: 375 10*3/uL (ref 150–400)
RBC: 3.03 MIL/uL — ABNORMAL LOW (ref 4.22–5.81)
RDW: 15.2 % (ref 11.5–15.5)
WBC: 9.8 10*3/uL (ref 4.0–10.5)

## 2011-12-06 LAB — GLUCOSE, CAPILLARY
Glucose-Capillary: 110 mg/dL — ABNORMAL HIGH (ref 70–99)
Glucose-Capillary: 79 mg/dL (ref 70–99)

## 2011-12-06 MED ORDER — SODIUM CHLORIDE 0.9 % IJ SOLN
3.0000 mL | Freq: Two times a day (BID) | INTRAMUSCULAR | Status: DC
  Administered 2011-12-10 – 2011-12-15 (×5): 3 mL via INTRAVENOUS

## 2011-12-06 MED ORDER — SODIUM CHLORIDE 0.9 % IV SOLN
250.0000 mL | INTRAVENOUS | Status: DC | PRN
Start: 2011-12-06 — End: 2011-12-17

## 2011-12-06 MED ORDER — POTASSIUM CHLORIDE 10 MEQ/50ML IV SOLN
INTRAVENOUS | Status: AC
  Filled 2011-12-06: qty 50

## 2011-12-06 MED ORDER — SODIUM CHLORIDE 0.9 % IJ SOLN: 3.0000 mL | INTRAMUSCULAR | Status: DC | PRN

## 2011-12-06 MED ORDER — MOVING RIGHT ALONG BOOK
Freq: Once | Status: AC
  Administered 2011-12-06: 18:00:00
  Filled 2011-12-06: qty 1

## 2011-12-06 MED ORDER — POLYSACCHARIDE IRON 150 MG PO CAPS
150.0000 mg | ORAL_CAPSULE | Freq: Every day | ORAL | Status: DC
  Administered 2011-12-06 – 2011-12-17 (×12): 150 mg via ORAL
  Filled 2011-12-06 (×12): qty 1

## 2011-12-06 MED ORDER — POTASSIUM CHLORIDE 10 MEQ/50ML IV SOLN
10.0000 meq | INTRAVENOUS | Status: AC | PRN
  Administered 2011-12-06 (×3): 10 meq via INTRAVENOUS

## 2011-12-06 MED ORDER — ENOXAPARIN SODIUM 40 MG/0.4ML ~~LOC~~ SOLN
40.0000 mg | SUBCUTANEOUS | Status: DC
  Administered 2011-12-06 – 2011-12-16 (×11): 40 mg via SUBCUTANEOUS
  Filled 2011-12-06 (×12): qty 0.4

## 2011-12-06 NOTE — Progress Notes (Signed)
Pt. Refuses CPAP at this time. Pt. Stated that he was unable to wear his CPAP due to coughing up mucus. Pt. Said that he would wear his CPAP during the day during naps. Pt. Is stable at this time & all vitals are WNL.

## 2011-12-06 NOTE — Progress Notes (Signed)
Subjective:  Feels well this am.  No significant dyspnea or chest pain. Improved response to IV lasix past 24 hours. Chest xray yesterday was improved. Diarrhea has ceased with immodium.  C. Diff negative.  Objective:  Vital Signs in the last 24 hours: Temp:  [97.6 F (36.4 C)-98.7 F (37.1 C)] 98.5 F (36.9 C) (02/07 0756) Pulse Rate:  [72-92] 72  (02/07 0900) Resp:  [16-24] 18  (02/07 0900) BP: (121-165)/(57-82) 124/72 mmHg (02/07 0900) SpO2:  [91 %-99 %] 99 % (02/07 0900) Weight:  [357 lb 2.3 oz (162 kg)] 357 lb 2.3 oz (162 kg) (02/07 0600)  Intake/Output from previous day: 02/06 0701 - 02/07 0700 In: 1176 [P.O.:720; I.V.:290; IV Piggyback:166] Out: 2851 [Urine:2600; Stool:251] Intake/Output from this shift: Total I/O In: 100 [IV Piggyback:100] Out: 401 [Urine:400; Stool:1]     . amiodarone  400 mg Oral BID  . antiseptic oral rinse  15 mL Mouth Rinse BID  . bisacodyl  10 mg Oral Daily   Or  . bisacodyl  10 mg Rectal Daily  . citalopram  20 mg Oral Daily  . colchicine  0.6 mg Oral BID  . docusate sodium  200 mg Oral Daily  . fenofibrate  160 mg Oral Daily  . furosemide  80 mg Intravenous BID  . insulin aspart  0-20 Units Subcutaneous TID WC  . insulin aspart  0-5 Units Subcutaneous QHS  . insulin glargine  20 Units Subcutaneous BID  . metoprolol tartrate  12.5 mg Oral BID   Or  . metoprolol tartrate  12.5 mg Per Tube BID  . pantoprazole  40 mg Oral Q1200  . polysaccharide iron  150 mg Oral Daily  . potassium chloride  10 mEq Intravenous Q1 Hr x 3  . rosuvastatin  20 mg Oral q1800  . sodium chloride  10-40 mL Intracatheter Q12H  . sodium chloride  3 mL Intravenous Q12H  . DISCONTD: enoxaparin  40 mg Subcutaneous Q24H      . sodium chloride Stopped (12/05/11 2200)    Physical Exam: The patient appears to be in no distress.  Head and neck exam reveals that the pupils are equal and reactive.  The extraocular movements are full.  There is no scleral  icterus.  Mouth and pharynx are benign.  No lymphadenopathy.  No carotid bruits.  The jugular venous pressure is normal.  Thyroid is not enlarged or tender.  Chest is clear to percussion and auscultation.  No rales or rhonchi.  Expansion of the chest is symmetrical.  Heart reveals no abnormal lift or heave. Soft systolic murmur at base.  No gallop or rub.  The abdomen is soft and nontender.  Bowel sounds are normoactive.  There is no hepatosplenomegaly or mass.  There are no abdominal bruits.  Extremities reveal no phlebitis or edema.  Pedal pulses are good.  There is no cyanosis or clubbing.  Neurologic exam is normal strength and no lateralizing weakness.  No sensory deficits.  Integument reveals no rash  Lab Results:  Basename 12/06/11 0408 12/05/11 0350  WBC 9.8 9.8  HGB 8.2* 8.4*  PLT 375 374    Basename 12/06/11 0408 12/05/11 0350  NA 142 143  K 3.5 3.8  CL 98 101  CO2 37* 34*  GLUCOSE 116* 130*  BUN 22 29*  CREATININE 1.01 1.03   No results found for this basename: TROPONINI:2,CK,MB:2 in the last 72 hours Hepatic Function Panel No results found for this basename: PROT,ALBUMIN,AST,ALT,ALKPHOS,BILITOT,BILIDIR,IBILI in the last 72  hours No results found for this basename: CHOL in the last 72 hours No results found for this basename: PROTIME in the last 72 hours  Imaging: Imaging results have been reviewed  Cardiac Studies:  Assessment/Plan:  Patient Active Hospital Problem List: Systolic and diastolic CHF, 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 Weight down 14 lb in past 2 days.   Plan: Continue IV Lasix Diarrhea  Resolved.    LOS: 23 days    Cassell Clement 12/06/2011, 9:52 AM

## 2011-12-06 NOTE — Progress Notes (Signed)
O.T. Evaluation is needed to assist in determining rehabilitation venue needs and insurance pre certification. Please order. Pager [phone] for questions.

## 2011-12-06 NOTE — Progress Notes (Signed)
Nutrition Follow-up  Pt states he is eating better -- amenable to RD ordering snacks as he reports he does not tolerate nutritional supplements well.  Diet Order:  Carbohydrate Modified Medium Calorie. PO intake variable at 15-90% per flowsheet reocrds.  Meds: Scheduled Meds:   . amiodarone  400 mg Oral BID  . antiseptic oral rinse  15 mL Mouth Rinse BID  . bisacodyl  10 mg Oral Daily   Or  . bisacodyl  10 mg Rectal Daily  . citalopram  20 mg Oral Daily  . colchicine  0.6 mg Oral BID  . docusate sodium  200 mg Oral Daily  . fenofibrate  160 mg Oral Daily  . furosemide  80 mg Intravenous BID  . insulin aspart  0-20 Units Subcutaneous TID WC  . insulin aspart  0-5 Units Subcutaneous QHS  . insulin glargine  20 Units Subcutaneous BID  . metoprolol tartrate  12.5 mg Oral BID   Or  . metoprolol tartrate  12.5 mg Per Tube BID  . pantoprazole  40 mg Oral Q1200  . polysaccharide iron  150 mg Oral Daily  . potassium chloride  10 mEq Intravenous Q1 Hr x 3  . rosuvastatin  20 mg Oral q1800  . sodium chloride  10-40 mL Intracatheter Q12H  . sodium chloride  3 mL Intravenous Q12H  . DISCONTD: enoxaparin  40 mg Subcutaneous Q24H   Continuous Infusions:   . sodium chloride Stopped (12/05/11 2200)   PRN Meds:.HYDROcodone-acetaminophen, loperamide, metoprolol, ondansetron (ZOFRAN) IV, potassium chloride, potassium chloride, sodium chloride, sodium chloride, traMADol  Labs:  CMP     Component Value Date/Time   NA 142 12/06/2011 0408   K 3.5 12/06/2011 0408   CL 98 12/06/2011 0408   CO2 37* 12/06/2011 0408   GLUCOSE 116* 12/06/2011 0408   BUN 22 12/06/2011 0408   CREATININE 1.01 12/06/2011 0408   CALCIUM 9.0 12/06/2011 0408   PROT 7.0 11/27/2011 0520   ALBUMIN 3.0* 11/27/2011 0520   AST 18 11/27/2011 0520   ALT 24 11/27/2011 0520   ALKPHOS 69 11/27/2011 0520   BILITOT 0.2* 11/27/2011 0520   GFRNONAA 78* 12/06/2011 0408   GFRAA >90 12/06/2011 0408     Intake/Output Summary (Last 24 hours) at 12/06/11  1129 Last data filed at 12/06/11 1050  Gross per 24 hour  Intake    899 ml  Output   2952 ml  Net  -2053 ml    CBG (last 3)   Basename 12/06/11 0754 12/05/11 2345 12/05/11 2300  GLUCAP 110* 79 118*    Weight Status:  162 kg (2/7) -- down likely due to diuresis  Re-estimated needs:  1900-2000 kcals, 110-120 gm protein  Nutrition Dx:  Inadequate Oral Intake, improved  Goal:  PO diet & snack intake to meet >90% of estimated nutrition needs, currently unmet Monitor: PO intake, weight, labs, I/O's  Intervention/Plan:    Snacks TID  RD to follow for nutrition care plan   Alger Memos Pager #:  [phone]

## 2011-12-06 NOTE — Progress Notes (Signed)
Patient transferred to unit 2000.  Patient's wife, Chip Boer notified.  Pt transferred with bariatric equipment.  Pt connected to cardiac monitor and denied any needs.

## 2011-12-06 NOTE — Progress Notes (Signed)
7 Days Post-Op Procedure(s) (LRB): AORTIC VALVE REPLACEMENT (AVR) (N/A) Subjective: OOB with better strength NSR CXR w/ better aeration  Objective: Vital signs in last 24 hours: Temp:  [97.6 F (36.4 C)-98.7 F (37.1 C)] 98.5 F (36.9 C) (02/07 0756) Pulse Rate:  [73-92] 73  (02/07 0300) Cardiac Rhythm:  [-] Normal sinus rhythm (02/06 2347) Resp:  [16-24] 21  (02/07 0300) BP: (121-165)/(57-82) 125/71 mmHg (02/07 0300) SpO2:  [92 %-96 %] 93 % (02/07 0300) Weight:  [357 lb 2.3 oz (162 kg)] 357 lb 2.3 oz (162 kg) (02/07 0600)  Hemodynamic parameters for last 24 hours:    Intake/Output from previous day: 02/06 0701 - 02/07 0700 In: 1176 [P.O.:720; I.V.:290; IV Piggyback:166] Out: 2851 [Urine:2600; Stool:251] Intake/Output this shift:    Incisions clear 2+ edema    Lab Results:  Basename 12/06/11 0408 12/05/11 0350  WBC 9.8 9.8  HGB 8.2* 8.4*  HCT 26.2* 26.7*  PLT 375 374   BMET:  Basename 12/06/11 0408 12/05/11 0350  NA 142 143  K 3.5 3.8  CL 98 101  CO2 37* 34*  GLUCOSE 116* 130*  BUN 22 29*  CREATININE 1.01 1.03  CALCIUM 9.0 9.3    PT/INR: No results found for this basename: LABPROT,INR in the last 72 hours ABG    Component Value Date/Time   PHART 7.422 12/01/2011 0912   HCO3 24.4* 12/01/2011 0912   TCO2 26 12/01/2011 0912   ACIDBASEDEF 3.0* 12/01/2011 0414   O2SAT 92.0 12/01/2011 0912   CBG (last 3)   Basename 12/06/11 0754 12/05/11 2345 12/05/11 2300  GLUCAP 110* 79 118*    Assessment/Plan: S/P Procedure(s) (LRB): AORTIC VALVE REPLACEMENT (AVR) (N/A) Plan for transfer to step-down: see transfer orders No coumadin Cont IV lasix for now  LOS: 23 days    VAN TRIGT III,Kymber Kosar 12/06/2011

## 2011-12-07 LAB — CBC
HCT: 26.7 % — ABNORMAL LOW (ref 39.0–52.0)
Hemoglobin: 8.4 g/dL — ABNORMAL LOW (ref 13.0–17.0)
MCH: 27.3 pg (ref 26.0–34.0)
MCHC: 31.5 g/dL (ref 30.0–36.0)
MCV: 86.7 fL (ref 78.0–100.0)
Platelets: 370 10*3/uL (ref 150–400)
RBC: 3.08 MIL/uL — ABNORMAL LOW (ref 4.22–5.81)
RDW: 15.2 % (ref 11.5–15.5)
WBC: 9.8 10*3/uL (ref 4.0–10.5)

## 2011-12-07 LAB — BASIC METABOLIC PANEL
BUN: 17 mg/dL (ref 6–23)
CO2: 40 mEq/L (ref 19–32)
Calcium: 9.1 mg/dL (ref 8.4–10.5)
Chloride: 97 mEq/L (ref 96–112)
Creatinine, Ser: 0.94 mg/dL (ref 0.50–1.35)
GFR calc Af Amer: >90 mL/min (ref >90)
GFR calc non Af Amer: 88 mL/min — ABNORMAL LOW (ref >90)
Glucose, Bld: 118 mg/dL — ABNORMAL HIGH (ref 70–99)
Potassium: 3.3 mEq/L — ABNORMAL LOW (ref 3.5–5.1)
Sodium: 144 mEq/L (ref 135–145)

## 2011-12-07 LAB — GLUCOSE, CAPILLARY
Glucose-Capillary: 136 mg/dL — ABNORMAL HIGH (ref 70–99)
Glucose-Capillary: 132 mg/dL — ABNORMAL HIGH (ref 70–99)
Glucose-Capillary: 145 mg/dL — ABNORMAL HIGH (ref 70–99)

## 2011-12-07 MED ORDER — GUAIFENESIN ER 600 MG PO TB12
600.0000 mg | ORAL_TABLET | Freq: Two times a day (BID) | ORAL | Status: DC
  Administered 2011-12-07 – 2011-12-17 (×21): 600 mg via ORAL
  Filled 2011-12-07 (×23): qty 1

## 2011-12-07 MED ORDER — POTASSIUM CHLORIDE CRYS ER 20 MEQ PO TBCR: 40.0000 meq | EXTENDED_RELEASE_TABLET | Freq: Two times a day (BID) | ORAL | Status: DC

## 2011-12-07 MED ORDER — GLIMEPIRIDE 4 MG PO TABS
4.0000 mg | ORAL_TABLET | Freq: Every day | ORAL | Status: DC
  Administered 2011-12-08 – 2011-12-12 (×5): 4 mg via ORAL
  Filled 2011-12-07 (×6): qty 1

## 2011-12-07 MED ORDER — POTASSIUM CHLORIDE CRYS ER 20 MEQ PO TBCR
40.0000 meq | EXTENDED_RELEASE_TABLET | Freq: Once | ORAL | Status: AC
  Administered 2011-12-07: 40 meq via ORAL
  Filled 2011-12-07: qty 2

## 2011-12-07 MED ORDER — FUROSEMIDE 80 MG PO TABS
80.0000 mg | ORAL_TABLET | Freq: Two times a day (BID) | ORAL | Status: DC
  Administered 2011-12-07: 80 mg via ORAL
  Filled 2011-12-07 (×3): qty 1

## 2011-12-07 MED ORDER — POTASSIUM CHLORIDE CRYS ER 20 MEQ PO TBCR
40.0000 meq | EXTENDED_RELEASE_TABLET | Freq: Two times a day (BID) | ORAL | Status: DC
  Administered 2011-12-07 – 2011-12-17 (×20): 40 meq via ORAL
  Filled 2011-12-07 (×22): qty 2

## 2011-12-07 MED ORDER — FUROSEMIDE 80 MG PO TABS
80.0000 mg | ORAL_TABLET | Freq: Once | ORAL | Status: AC
  Administered 2011-12-07: 80 mg via ORAL
  Filled 2011-12-07: qty 1

## 2011-12-07 MED ORDER — FUROSEMIDE 80 MG PO TABS
80.0000 mg | ORAL_TABLET | Freq: Two times a day (BID) | ORAL | Status: DC
  Administered 2011-12-08 – 2011-12-10 (×5): 80 mg via ORAL
  Filled 2011-12-07 (×7): qty 1

## 2011-12-07 MED ORDER — POTASSIUM CHLORIDE CRYS ER 20 MEQ PO TBCR
30.0000 meq | EXTENDED_RELEASE_TABLET | Freq: Once | ORAL | Status: AC
  Administered 2011-12-07: 30 meq via ORAL
  Filled 2011-12-07: qty 1

## 2011-12-07 MED ORDER — FUROSEMIDE 40 MG PO TABS
40.0000 mg | ORAL_TABLET | Freq: Two times a day (BID) | ORAL | Status: DC
  Filled 2011-12-07: qty 1

## 2011-12-07 NOTE — Progress Notes (Signed)
Physical Therapy Treatment Patient Details Name: Antonio [name]. MRN: [id number] DOB: 07-27-1951 Today's Date: 12/07/2011  PT Assessment/Plan  PT - Assessment/Plan Comments on Treatment Session: Pt s/p AVR with increased ambulation today. Pt followed with chair by dgtr and required seated rest secondary to back pain. Pt progressing well but unable to verbalize sternal precautions and reeducated for these. Dgtrs present throughout PT Plan: Discharge plan remains appropriate;Frequency remains appropriate PT Goals  Acute Rehab PT Goals PT Goal: Sit to Stand - Progress: Progressing toward goal PT Goal: Stand to Sit - Progress: Progressing toward goal PT Transfer Goal: Bed to Chair/Chair to Bed - Progress: Progressing toward goal PT Goal: Ambulate - Progress: Progressing toward goal Additional Goals PT Goal: Additional Goal #1 - Progress: Progressing toward goal  PT Treatment Precautions/Restrictions  Precautions Precautions: Sternal;Fall Required Braces or Orthoses: No Restrictions Weight Bearing Restrictions: No Mobility (including Balance) Bed Mobility Bed Mobility: No Transfers Sit to Stand: 3: Mod assist;From chair/3-in-1 Sit to Stand Details (indicate cue type and reason): pt stood from [facility] x 2 trials with max cueing to place hands on thighs and anterior shift Stand to Sit: 4: Min assist;To chair/3-in-1 Stand to Sit Details: minguard with VC for sequence and precautions x 2 Ambulation/Gait Ambulation/Gait Assistance: 5: Supervision Ambulation/Gait Assistance Details (indicate cue type and reason): cues for breathing, stepping into the RW, and extending trunk Ambulation Distance (Feet): 120 Feet (120' then 6' after seated rest of ) Assistive device: Rolling walker Gait Pattern: Trunk flexed (wide BOS)  Posture/Postural Control Posture/Postural Control: No significant limitations Exercise  General Exercises - Lower Extremity Long Arc Quad: AROM;20 reps;Both;Seated Hip  Flexion/Marching: AROM;Both;Other reps (comment);Seated (20reps) End of Session PT - End of Session Activity Tolerance: Patient tolerated treatment well Patient left: in chair;with call bell in reach;with family/visitor present Nurse Communication: Mobility status for transfers;Mobility status for ambulation General Behavior During Session: [facility] for tasks performed Cognition: [facility] for tasks performed  Delorse Lek 12/07/2011, 3:35 PM Toney Sang, PT [phone]

## 2011-12-07 NOTE — Progress Notes (Signed)
Pt faxed out as backup to CIR. CSW will continue to follow.  Baxter Flattery, MSW [phone]

## 2011-12-07 NOTE — Progress Notes (Signed)
90SRCARDIAC REHAB PHASE I   PRE:  Rate/Rhythm:   BP:  Supine: 136/74  Sitting:   Standing:    SaO2: 97%2L  MODE:  Ambulation: 100 ft   POST:  Rate/Rhythem: 97SR  BP:  Supine:   Sitting: 143/81  Standing:    SaO2: 90%2L 0800-0840 Pt walked 100 ft on 2L with rolling walker and asst x 2. Had to sit down in chair once to rest due to dizziness. He was able to walk farther after resting. Walked about 60 ft rested and walked 40 ft more. Left in chair with call bell. Will see pt along with PT.  [name]

## 2011-12-07 NOTE — Progress Notes (Signed)
Placed pt. On CPAP auto titrate (min: 7, max:12) via pt.'s home nasal mask with 2L O2 bled in. Pt. Is tolerating CPAP well at this time.

## 2011-12-07 NOTE — Progress Notes (Signed)
Patient ambulated 154ft with rolling walker and oxygen 2L [name]. Patient tolerated walk with one sitting rest break. Tired at end of walk. Will continue to monitor. Antonio [name]

## 2011-12-07 NOTE — Progress Notes (Signed)
Subjective:  Feels well this am.  No significant dyspnea or chest pain. Now on oral lasix.  Weight down 10 lb overnight [different scales. Rhythm remains NSR on amiodarone.  Objective:  Vital Signs in the last 24 hours: Temp:  [97.8 F (36.6 C)-98.4 F (36.9 C)] 97.8 F (36.6 C) (02/08 0621) Pulse Rate:  [72-84] 80  (02/08 0621) Resp:  [17-21] 20  (02/08 0621) BP: (116-163)/(57-93) 153/83 mmHg (02/08 0621) SpO2:  [92 %-99 %] 93 % (02/08 0621) Weight:  [347 lb 12.8 oz (157.761 kg)] 347 lb 12.8 oz (157.761 kg) (02/08 0621)  Intake/Output from previous day: 02/07 0701 - 02/08 0700 In: 633 [P.O.:483; IV Piggyback:150] Out: 1952 [Urine:1951; Stool:2] Intake/Output from this shift: Total I/O In: 240 [P.O.:240] Out: -      . amiodarone  400 mg Oral BID  . antiseptic oral rinse  15 mL Mouth Rinse BID  . bisacodyl  10 mg Oral Daily   Or  . bisacodyl  10 mg Rectal Daily  . citalopram  20 mg Oral Daily  . colchicine  0.6 mg Oral BID  . docusate sodium  200 mg Oral Daily  . enoxaparin  40 mg Subcutaneous Q24H  . fenofibrate  160 mg Oral Daily  . furosemide  80 mg Oral BID  . insulin aspart  0-20 Units Subcutaneous TID WC  . insulin aspart  0-5 Units Subcutaneous QHS  . insulin glargine  20 Units Subcutaneous BID  . metoprolol tartrate  12.5 mg Oral BID   Or  . metoprolol tartrate  12.5 mg Per Tube BID  . moving right along book   Does not apply Once  . pantoprazole  40 mg Oral Q1200  . polysaccharide iron  150 mg Oral Daily  . rosuvastatin  20 mg Oral q1800  . sodium chloride  10-40 mL Intracatheter Q12H  . sodium chloride  3 mL Intravenous Q12H  . sodium chloride  3 mL Intravenous Q12H  . DISCONTD: furosemide  80 mg Intravenous BID      . sodium chloride Stopped (12/05/11 2200)    Physical Exam: The patient appears to be in no distress.  Head and neck exam reveals that the pupils are equal and reactive.  The extraocular movements are full.  There is no scleral  icterus.  Mouth and pharynx are benign.  No lymphadenopathy.  No carotid bruits.  The jugular venous pressure is normal.  Thyroid is not enlarged or tender.  Chest is clear to percussion and auscultation.  No rales or rhonchi.  Expansion of the chest is symmetrical.  Heart reveals no abnormal lift or heave. Soft systolic murmur at base.  No gallop or rub.  The abdomen is soft and nontender.  Bowel sounds are normoactive.  There is no hepatosplenomegaly or mass.  There are no abdominal bruits.  Extremities reveal no phlebitis or edema.  Pedal pulses are good.  There is no cyanosis or clubbing.  Neurologic exam is normal strength and no lateralizing weakness.  No sensory deficits.  Integument reveals no rash  Lab Results:  Basename 12/07/11 0510 12/06/11 0408  WBC 9.8 9.8  HGB 8.4* 8.2*  PLT 370 375    Basename 12/07/11 0510 12/06/11 0408  NA 144 142  K 3.3* 3.5  CL 97 98  CO2 40* 37*  GLUCOSE 118* 116*  BUN 17 22  CREATININE 0.94 1.01   No results found for this basename: TROPONINI:2,CK,MB:2 in the last 72 hours Hepatic Function Panel No results  found for this basename: PROT,ALBUMIN,AST,ALT,ALKPHOS,BILITOT,BILIDIR,IBILI in the last 72 hours No results found for this basename: CHOL in the last 72 hours No results found for this basename: PROTIME in the last 72 hours  Imaging: Imaging results have been reviewed  Cardiac Studies:  Assessment/Plan:  Patient Active Hospital Problem List: Systolic and diastolic CHF, 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 Weight down 24 lb in past 3 days.   Plan: Continue diuresis. Oral lasix.  Diarrhea  Resolved.    LOS: 24 days    Cassell Clement 12/07/2011, 9:36 AM

## 2011-12-07 NOTE — Progress Notes (Addendum)
8 Days Post-Op Procedure(s) (LRB): AORTIC VALVE REPLACEMENT (AVR) (N/A)  Subjective: Patient continues to feel better.  Objective: Vital signs in last 24 hours: Patient Vitals for the past 24 hrs:  BP Temp Temp src Pulse Resp SpO2 Weight  12/07/11 1000 126/69 mmHg - - 80  - - -  12/07/11 0621 153/83 mmHg 97.8 F (36.6 C) Oral 80  20  93 % 347 lb 12.8 oz (157.761 kg)  12/06/11 2036 163/93 mmHg 97.9 F (36.6 C) Oral 82  20  92 % -  12/06/11 1521 147/75 mmHg 98.4 F (36.9 C) Oral 72  18  99 % -  12/06/11 1400 116/61 mmHg - - 72  20  99 % -  12/06/11 1300 123/83 mmHg - - 75  17  98 % -   Pre op weight  158.3 kg Current Weight  12/07/11 347 lb 12.8 oz (157.761 kg)      Intake/Output from previous day: 02/07 0701 - 02/08 0700 In: 633 [P.O.:483; IV Piggyback:150] Out: 1952 [Urine:1950; Stool:2]   Physical Exam:  Cardiovascular: RRR, no murmurs, gallops, or rubs. Pulmonary: Decreased at bases; no rales, wheezes, or rhonchi. Abdomen: Soft, non tender, bowel sounds present. Extremities: 2+ bilateral lower extremity edema.Chronic venous stasis changes. Wounds: Clean and dry.  No erythema or signs of infection.  Lab Results: CBC: Basename 12/07/11 0510 12/06/11 0408  WBC 9.8 9.8  HGB 8.4* 8.2*  HCT 26.7* 26.2*  PLT 370 375   BMET:  Basename 12/07/11 0510 12/06/11 0408  NA 144 142  K 3.3* 3.5  CL 97 98  CO2 40* 37*  GLUCOSE 118* 116*  BUN 17 22  CREATININE 0.94 1.01  CALCIUM 9.1 9.0    PT/INR: No results found for this basename: LABPROT,INR in the last 72 hours ABG:  INR: Will add last result for INR, ABG once components are confirmed Will add last 4 CBG results once components are confirmed  Assessment/Plan:  1. CV -  MaintainingSR. Continue Amiodarone 400 bid, Lopressor 12.5 bid. 2.  Pulmonary - Encourage incentive spirometer.Wean O2 as tolerates. 3. Volume Overload - Continue with Lasix 80 bid for now. 4.  Acute blood loss anemia - H/H stable at  8.4/26.7.Continue Ni ferex. 5.DM- CBGs 125/136/132. Last  HGA1C in January was 8.5. Continue Insulin. Will restart Amaryl.Will need follow up as an outpatient. 6.Hypokalemia-Supplement. 7.OSA-Continue CPAP. 8.Remove EPW am.   Takeysha Bonk MPA-C 12/07/2011

## 2011-12-08 DIAGNOSIS — I480 Paroxysmal atrial fibrillation: Secondary | ICD-10-CM

## 2011-12-08 DIAGNOSIS — I4891 Unspecified atrial fibrillation: Secondary | ICD-10-CM

## 2011-12-08 LAB — GLUCOSE, CAPILLARY
Glucose-Capillary: 173 mg/dL — ABNORMAL HIGH (ref 70–99)
Glucose-Capillary: 89 mg/dL (ref 70–99)

## 2011-12-08 LAB — BASIC METABOLIC PANEL
CO2: 38 mEq/L — ABNORMAL HIGH (ref 19–32)
Chloride: 97 mEq/L (ref 96–112)
GFR calc Af Amer: 89 mL/min — ABNORMAL LOW (ref >90)
Potassium: 3.9 mEq/L (ref 3.5–5.1)

## 2011-12-08 MED ORDER — ALTEPLASE 2 MG IJ SOLR
2.0000 mg | Freq: Once | INTRAMUSCULAR | Status: AC
  Administered 2011-12-08: 2 mg
  Filled 2011-12-08: qty 2

## 2011-12-08 NOTE — Progress Notes (Signed)
Pt currently asleep without his CPAP, stated that he cannot breath well with CPAP. Denies any discomfort. Will continue to monitor.

## 2011-12-08 NOTE — Progress Notes (Signed)
Patient ID: Antonio [name]., male   DOB: July 06, 1951, 61 y.o.   MRN: [id number]   Subjective:  Feels well this am.  No significant dyspnea or chest pain. Now on oral lasix.  Rhythm remains NSR on amiodarone.  Objective:  Vital Signs in the last 24 hours: Temp:  [97.5 F (36.4 C)-98.8 F (37.1 C)] 97.5 F (36.4 C) (02/09 0435) Pulse Rate:  [73-93] 88  (02/09 0917) Resp:  [18-20] 19  (02/09 0435) BP: (122-139)/(69-88) 125/88 mmHg (02/09 0917) SpO2:  [94 %-99 %] 94 % (02/09 0435) Weight:  [159.485 kg (351 lb 9.6 oz)] 159.485 kg (351 lb 9.6 oz) (02/09 0435)  Intake/Output from previous day: 02/08 0701 - 02/09 0700 In: 960 [P.O.:960] Out: 1401 [Urine:1400; Stool:1]      . alteplase  2 mg Intracatheter Once  . amiodarone  400 mg Oral BID  . antiseptic oral rinse  15 mL Mouth Rinse BID  . bisacodyl  10 mg Oral Daily   Or  . bisacodyl  10 mg Rectal Daily  . citalopram  20 mg Oral Daily  . colchicine  0.6 mg Oral BID  . docusate sodium  200 mg Oral Daily  . enoxaparin  40 mg Subcutaneous Q24H  . fenofibrate  160 mg Oral Daily  . furosemide  80 mg Oral BID  . furosemide  80 mg Oral Once  . glimepiride  4 mg Oral Q breakfast  . guaiFENesin  600 mg Oral BID  . insulin aspart  0-20 Units Subcutaneous TID WC  . insulin aspart  0-5 Units Subcutaneous QHS  . insulin glargine  20 Units Subcutaneous BID  . metoprolol tartrate  12.5 mg Oral BID  . pantoprazole  40 mg Oral Q1200  . polysaccharide iron  150 mg Oral Daily  . potassium chloride  30 mEq Oral Once  . potassium chloride  40 mEq Oral Once  . potassium chloride  40 mEq Oral BID  . rosuvastatin  20 mg Oral q1800  . sodium chloride  10-40 mL Intracatheter Q12H  . sodium chloride  3 mL Intravenous Q12H  . sodium chloride  3 mL Intravenous Q12H  . DISCONTD: furosemide  40 mg Oral BID  . DISCONTD: furosemide  80 mg Oral BID  . DISCONTD: metoprolol tartrate  12.5 mg Per Tube BID  . DISCONTD: potassium chloride  40 mEq Oral BID   . DISCONTD: potassium chloride  40 mEq Oral BID      . sodium chloride Stopped (12/05/11 2200)    Physical Exam: The patient appears to be in no distress.  Head and neck exam reveals that the pupils are equal and reactive.  The extraocular movements are full.  There is no scleral icterus.  Mouth and pharynx are benign.  No lymphadenopathy.  No carotid bruits.  The jugular venous pressure is normal.  Thyroid is not enlarged or tender.  Chest is clear to percussion and auscultation.  No rales or rhonchi.  Expansion of the chest is symmetrical.  Heart reveals no abnormal lift or heave. Soft systolic murmur at base.  No gallop or rub.  The abdomen is soft and nontender.  Bowel sounds are normoactive.  There is no hepatosplenomegaly or mass.  There are no abdominal bruits.  Extremities reveal no phlebitis or edema.  Pedal pulses are good.  There is no cyanosis or clubbing.  Neurologic exam is normal strength and no lateralizing weakness.  No sensory deficits.  Integument reveals no rash  Lab Results:  Basename 12/07/11 0510 12/06/11 0408  WBC 9.8 9.8  HGB 8.4* 8.2*  PLT 370 375    Basename 12/08/11 0630 12/07/11 0510  NA 141 144  K 3.9 3.3*  CL 97 97  CO2 38* 40*  GLUCOSE 144* 118*  BUN 15 17  CREATININE 1.03 0.94   No results found for this basename: TROPONINI:2,CK,MB:2 in the last 72 hours Hepatic Function Panel No results found for this basename: PROT,ALBUMIN,AST,ALT,ALKPHOS,BILITOT,BILIDIR,IBILI in the last 72 hours No results found for this basename: CHOL in the last 72 hours No results found for this basename: PROTIME in the last 72 hours  Imaging: Imaging results have been reviewed  Cardiac Studies:  Assessment/Plan:  PAF:  NSR continue amiodarone 400 bid and beta blocker CHF:  Continue diuresis improved  Lasix 80 bid    LOS: 25 days    Charlton Haws 12/08/2011, 9:35 AM

## 2011-12-08 NOTE — Progress Notes (Addendum)
[address]            [place] [id number]          [phone]     9 Days Post-Op  Procedure(s) (LRB): AORTIC VALVE REPLACEMENT (AVR) (N/A) Subjective: Feels ok  Objective  Telemetry NSR, PVC's  Temp:  [97.5 F (36.4 C)-98.8 F (37.1 C)] 97.5 F (36.4 C) (02/09 0435) Pulse Rate:  [73-93] 88  (02/09 0917) Resp:  [18-20] 19  (02/09 0435) BP: (122-139)/(69-88) 125/88 mmHg (02/09 0917) SpO2:  [94 %-99 %] 94 % (02/09 0435) Weight:  [351 lb 9.6 oz (159.485 kg)] 351 lb 9.6 oz (159.485 kg) (02/09 0435)   Intake/Output Summary (Last 24 hours) at 12/08/11 0927 Last data filed at 12/08/11 0700  Gross per 24 hour  Intake    720 ml  Output   1401 ml  Net   -681 ml       General appearance: alert, cooperative and no distress Heart: regular rate and rhythm and S1, S2 normal Lungs: diminished L base Abdomen: soft, nontender Extremities: + edema Wound: Incision healing well  Lab Results:  Basename 12/08/11 0630 12/07/11 0510  NA 141 144  K 3.9 3.3*  CL 97 97  CO2 38* 40*  GLUCOSE 144* 118*  BUN 15 17  CREATININE 1.03 0.94  CALCIUM 9.4 9.1  MG -- --  PHOS -- --   No results found for this basename: AST:2,ALT:2,ALKPHOS:2,BILITOT:2,PROT:2,ALBUMIN:2 in the last 72 hours No results found for this basename: LIPASE:2,AMYLASE:2 in the last 72 hours  Basename 12/07/11 0510 12/06/11 0408  WBC 9.8 9.8  NEUTROABS -- --  HGB 8.4* 8.2*  HCT 26.7* 26.2*  MCV 86.7 86.5  PLT 370 375   No results found for this basename: CKTOTAL:4,CKMB:4,TROPONINI:4 in the last 72 hours No components found with this basename: POCBNP:3 No results found for this basename: DDIMER in the last 72 hours No results found for this basename: HGBA1C in the last 72 hours No results found for this basename: CHOL,HDL,LDLCALC,TRIG,CHOLHDL in the last 72 hours No results found for this basename: TSH,T4TOTAL,FREET3,T3FREE,THYROIDAB in the last 72 hours No results found for  this basename: VITAMINB12,FOLATE,FERRITIN,TIBC,IRON,RETICCTPCT in the last 72 hours  Medications: Scheduled    . alteplase  2 mg Intracatheter Once  . amiodarone  400 mg Oral BID  . antiseptic oral rinse  15 mL Mouth Rinse BID  . bisacodyl  10 mg Oral Daily   Or  . bisacodyl  10 mg Rectal Daily  . citalopram  20 mg Oral Daily  . colchicine  0.6 mg Oral BID  . docusate sodium  200 mg Oral Daily  . enoxaparin  40 mg Subcutaneous Q24H  . fenofibrate  160 mg Oral Daily  . furosemide  80 mg Oral BID  . furosemide  80 mg Oral Once  . glimepiride  4 mg Oral Q breakfast  . guaiFENesin  600 mg Oral BID  . insulin aspart  0-20 Units Subcutaneous TID WC  . insulin aspart  0-5 Units Subcutaneous QHS  . insulin glargine  20 Units Subcutaneous BID  . metoprolol tartrate  12.5 mg Oral BID  . pantoprazole  40 mg Oral Q1200  . polysaccharide iron  150 mg Oral Daily  . potassium chloride  30 mEq Oral Once  . potassium chloride  40 mEq Oral Once  . potassium chloride  40 mEq Oral BID  .  rosuvastatin  20 mg Oral q1800  . sodium chloride  10-40 mL Intracatheter Q12H  . sodium chloride  3 mL Intravenous Q12H  . sodium chloride  3 mL Intravenous Q12H  . DISCONTD: furosemide  40 mg Oral BID  . DISCONTD: furosemide  80 mg Oral BID  . DISCONTD: metoprolol tartrate  12.5 mg Per Tube BID  . DISCONTD: potassium chloride  40 mEq Oral BID  . DISCONTD: potassium chloride  40 mEq Oral BID     Radiology/Studies:  No results found.  INR: Will add last result for INR, ABG once components are confirmed Will add last 4 CBG results once components are confirmed  Assessment/Plan: S/P Procedure(s) (LRB): AORTIC VALVE REPLACEMENT (AVR) (N/A)   1. Doing well 2. Cont diuresis 3. Push pulm toilet, rehab as able 4. cbg well controlled  LOS: 25 days    [name],[name] 2/9/20139:27 AM    patient examined and medical record reviewed,agree with above note. VAN TRIGT III,Antonio [name] 12/08/2011

## 2011-12-08 NOTE — Progress Notes (Signed)
12/08/2011 0925 Nursing Note: EPW D/c per orders and per protocol. Ends intact. Frequent vital signs obtained per protocol. Patient tolerated well. Advised patient of bedrest for one hour. Will continue to monitor patient. Dorita Rowlands, Blanchard Kelch

## 2011-12-08 NOTE — Progress Notes (Signed)
CARDIAC REHAB PHASE I   PRE:  Rate/Rhythm: Sr 77  BP:  Supine:  Sitting: 121/76 radial  Standing:    SaO2: 96 2lncc  MODE:  Ambulation: 150  ft   POST:  Rate/Rhythem: SR 94  BP:  Supine:   Sitting: 144/87  Standing:    SaO2: 94 2lncc  Antonio [name] Pt ambulated with walker and one assist with second staff member following with wheelchair.  Pt able to ambulate without any rest breaks.  Pt encouraged by his progress.  Pt declined offer to wean from oxygen with his walk "maybe tomorrow".  Pt to wheelchair with family at bedside.

## 2011-12-08 NOTE — Evaluation (Signed)
Occupational Therapy Evaluation Patient Details Name: Antonio [name]. MRN: [id number] DOB: [birth year] Today's Date: 12/08/2011  Problem List:  Patient Active Problem List  Diagnoses  . DIABETES MELLITUS, TYPE II, UNCONTROLLED  . HYPERLIPIDEMIA  . Gout, unspecified  . OBESITY, NOS  . HYPERTENSION, BENIGN SYSTEMIC  . AORTIC STENOSIS  . EDEMA-LEGS,DUE TO VENOUS OBSTRUCT.  . OSTEOARTHRITIS OF SPINE, NOS  . BACK PAIN, LUMBAR  . CARDIOMYOPATHY, SECONDARY  . DYSPNEA  . ABNORMAL CV (STRESS) TEST  . Acute on chronic systolic congestive heart failure  . Sleep apnea  . Systolic and diastolic CHF, acute on chronic  . PAF (paroxysmal atrial fibrillation)    Past Medical History:  Past Medical History  Diagnosis Date  . Hepatitis   . Chronic systolic heart failure     echo 12/11: EF 45-50%, grade 1 diast dysfxn, moderately severe AS (AVA 0.9, mean 31 mmHg), mild MR;    cath 1/12:  normal cors, AVA 1.0, peak to peak gradiet for AV 31 mmHg, mod-severe AS, EF 35-40%  . 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 Hx MRSA infection     thigh abcess 11/06  . Anxiety   . Aortic stenosis     echo 12/11: AVA 0.9, mean 31, cath 1/12: AVA 1.0 with peak to peak 31 mmHg  . HTN (hypertension)   . DM2 (diabetes mellitus, type 2)   . HLD (hyperlipidemia)   . Gout   . NICM (nonischemic cardiomyopathy)    Past Surgical History:  Past Surgical History  Procedure Date  . Transthoracic echocardiogram 12/05    EF: 55-65% mild to moderate AS  . Cardiovascular stress test 10/10/04    ? inf. wall defect -  . Laminectomy 10/30/83  . Aortic valve replacement 11/29/2011    Procedure: AORTIC VALVE REPLACEMENT (AVR);  Surgeon: [name], MD;  [facility] OR;  Service: Open Heart Surgery;  Laterality: N/A;  with nitric oxide    OT Assessment/Plan/Recommendation OT Assessment Clinical Impression Statement: Pt is a 61 year old man recovering from AVR who at baseline was dependent in LB ADL,  most IADL and ambulating only short distances due to fatigue and back pain.  Pt is progressing remarkably well.  He requires mod to max assist for bathing and dressing.  He performs basic transfer from modified positions with min assist.  Pt could benefit from CIR for intensive rehab prior to return home.  He is highly motivated.  Will follow acutely. OT Recommendation/Assessment: Patient will need skilled OT in the acute care venue OT Problem List: Decreased strength;Decreased activity tolerance;Impaired balance (sitting and/or standing);Decreased knowledge of precautions;Decreased knowledge of use of DME or AE;Cardiopulmonary status limiting activity;Obesity;Pain;Impaired UE functional use OT Therapy Diagnosis : Generalized weakness;Acute pain OT Plan OT Frequency: Min 2X/week OT Treatment/Interventions: Self-care/ADL training;Energy conservation;DME and/or AE instruction;Patient/family education;Balance training;Therapeutic activities OT Recommendation Recommendations for Other Services: Rehab consult Follow Up Recommendations: Inpatient Rehab Equipment Recommended: Defer to next venue Individuals Consulted Consulted and Agree with Results and Recommendations: Patient OT Goals Acute Rehab OT Goals OT Goal Formulation: With patient Time For Goal Achievement: 2 weeks ADL Goals Pt Will Perform Grooming: with supervision;Standing at sink;Other (comment) (at least 2 activities) ADL Goal: Grooming - Progress: Goal set today Pt Will Perform Upper Body Bathing: Sitting, edge of bed;with supervision;with set-up;with adaptive equipment ADL Goal: Upper Body Bathing - Progress: Goal set today Pt Will Perform Lower Body Bathing: with min assist;Sit to stand  from bed;Sitting, edge of bed;with adaptive equipment ADL Goal: Lower Body Bathing - Progress: Goal set today Pt Will Perform Upper Body Dressing: with set-up;Sitting, bed ADL Goal: Upper Body Dressing - Progress: Goal set today Pt Will Perform  Lower Body Dressing: with adaptive equipment;Sit to stand from bed;Sitting, bed;with min assist ADL Goal: Lower Body Dressing - Progress: Goal set today Pt Will Transfer to Toilet: with supervision;Extra wide 3-in-1;Other (comment);Ambulation (over toilet) ADL Goal: Toilet Transfer - Progress: Goal set today Pt Will Perform Toileting - Clothing Manipulation: with supervision;Standing ADL Goal: Toileting - Clothing Manipulation - Progress: Goal set today Pt Will Perform Toileting - Hygiene: Independently;Sit to stand from 3-in-1/toilet ADL Goal: Toileting - Hygiene - Progress: Goal set today Miscellaneous OT Goals Miscellaneous OT Goal #1: Pt will adhere to sternal precautions during mobility and ADL without cues. OT Goal: Miscellaneous Goal #1 - Progress: Goal set today Miscellaneous OT Goal #2: Pt will utilize energy conservation strategies in ADL without cues. OT Goal: Miscellaneous Goal #2 - Progress: Goal set today  OT Evaluation Precautions/Restrictions  Precautions Precautions: Sternal;Fall Required Braces or Orthoses: No Restrictions Weight Bearing Restrictions: No Prior Functioning Home Living Lives With: Spouse Receives Help From: Family Type of Home: House Home Layout: Able to live on main level with bedroom/bathroom;Two level Alternate Level Stairs-Rails: Right Alternate Level Stairs-Number of Steps: 4 Home Access: Level entry Bathroom Shower/Tub: Health visitor: Handicapped height Home Adaptive Equipment: Straight cane;Walker - rolling Prior Function Level of Independence: Needs assistance with ADLs;Independent with gait;Needs assistance with homemaking Bath: Minimal (unable to access feet) Meal Prep: Supervision/set-up (could prepare light meal) Light Housekeeping: Total Driving: Yes ADL ADL Eating/Feeding: Performed;Independent Where Assessed - Eating/Feeding: Bed level Grooming: Performed;Teeth care;Set up;Wash/dry face Where Assessed -  Grooming: Supine, head of bed up Upper Body Bathing: Simulated;Moderate assistance Where Assessed - Upper Body Bathing: Sitting, bed Lower Body Bathing: Simulated;+1 Total assistance Where Assessed - Lower Body Bathing: Sitting, bed;Sit to stand from bed Upper Body Dressing: Simulated;Minimal assistance Where Assessed - Upper Body Dressing: Sitting, bed Lower Body Dressing: Simulated;+1 Total assistance;Other (comment) (pt usually wears Birkenstocks with no socks) Where Assessed - Lower Body Dressing: Sitting, bed;Sit to stand from bed Toilet Transfer: Simulated;Other (comment);Minimal assistance (bed to w/c) Toilet Transfer Method: Ambulating Equipment Used: Rolling walker Ambulation Related to ADLs: Min assist to ambulate 7 feet to w/c. ADL Comments: Pt's independence inhibited by decrease activity tolerance, obesity, back pain, decreased generalization of sternal precautions. Vision/Perception  Vision - History Patient Visual Report: No change from baseline Cognition Cognition Arousal/Alertness: Awake/alert Overall Cognitive Status: Appears within functional limits for tasks assessed Orientation Level: Oriented X4 Sensation/Coordination Sensation Light Touch: Appears Intact Hot/Cold: Appears Intact Proprioception: Appears Intact Coordination Coordination and Movement Description: somewhat tremulous as pt fatigues Extremity Assessment RUE Assessment RUE Assessment: Within Functional Limits (not formally assessed due to precautions) LUE Assessment LUE Assessment: Within Functional Limits (not formally assessed due to precautions) Mobility  Bed Mobility Bed Mobility: Yes Supine to Sit: 4: Min assist;With rails;HOB elevated (Comment degrees) (45) Sitting - Scoot to Edge of Bed: 5: Supervision;With rail Transfers Transfers: Yes Sit to Stand: From bed;4: Min assist;From elevated surface Stand to Sit: Other (comment);4: Min assist (to w/c) End of Session OT - End of  Session Activity Tolerance: Patient limited by fatigue Patient left: Other (comment) (w/c) General Behavior During Session: [facility] for tasks performed Cognition: [facility] for tasks performed   Evern Bio 12/08/2011, 11:04 AM  [phone]

## 2011-12-08 NOTE — Progress Notes (Signed)
Patient ambulated using rolling walker on 2L nasal cannula approximately 156ft at a moderate pace with one assist; RN following with wheelchair. Patient tolerated ambulation well. Patient's O2 saturation upon return was 99%. Patient returned to bed resting. Will continue to monitor.

## 2011-12-09 LAB — GLUCOSE, CAPILLARY

## 2011-12-09 NOTE — Progress Notes (Addendum)
[address]            [place] [address]          [phone]     10 Days Post-Op  Procedure(s) (LRB): AORTIC VALVE REPLACEMENT (AVR) (N/A) Subjective: Feeling better  Objective  Telemetry SR, episodes of bigeminal pvc's  Temp:  [97.4 F (36.3 C)-98.1 F (36.7 C)] 98.1 F (36.7 C) (02/10 0625) Pulse Rate:  [80-91] 80  (02/10 0625) Resp:  [18-19] 19  (02/10 0625) BP: (118-144)/(55-88) 127/72 mmHg (02/10 0625) SpO2:  [95 %-99 %] 97 % (02/10 0625) Weight:  [351 lb 3.2 oz (159.303 kg)] 351 lb 3.2 oz (159.303 kg) (02/10 0656)   Intake/Output Summary (Last 24 hours) at 12/09/11 0904 Last data filed at 12/09/11 0626  Gross per 24 hour  Intake    480 ml  Output    350 ml  Net    130 ml       General appearance: alert, cooperative and no distress Heart: regular rate and rhythm and S1, S2 normal Lungs: diminished in bases Abdomen: soft, nontender Extremities: edema appears improved Wound: incisions healing well  Lab Results:  Basename 12/08/11 0630 12/07/11 0510  NA 141 144  K 3.9 3.3*  CL 97 97  CO2 38* 40*  GLUCOSE 144* 118*  BUN 15 17  CREATININE 1.03 0.94  CALCIUM 9.4 9.1  MG -- --  PHOS -- --   No results found for this basename: AST:2,ALT:2,ALKPHOS:2,BILITOT:2,PROT:2,ALBUMIN:2 in the last 72 hours No results found for this basename: LIPASE:2,AMYLASE:2 in the last 72 hours  Basename 12/07/11 0510  WBC 9.8  NEUTROABS --  HGB 8.4*  HCT 26.7*  MCV 86.7  PLT 370   No results found for this basename: CKTOTAL:4,CKMB:4,TROPONINI:4 in the last 72 hours No components found with this basename: POCBNP:3 No results found for this basename: DDIMER in the last 72 hours No results found for this basename: HGBA1C in the last 72 hours No results found for this basename: CHOL,HDL,LDLCALC,TRIG,CHOLHDL in the last 72 hours No results found for this basename: TSH,T4TOTAL,FREET3,T3FREE,THYROIDAB in the last 72 hours No results found  for this basename: VITAMINB12,FOLATE,FERRITIN,TIBC,IRON,RETICCTPCT in the last 72 hours  Medications: Scheduled    . amiodarone  400 mg Oral BID  . antiseptic oral rinse  15 mL Mouth Rinse BID  . bisacodyl  10 mg Oral Daily   Or  . bisacodyl  10 mg Rectal Daily  . citalopram  20 mg Oral Daily  . colchicine  0.6 mg Oral BID  . docusate sodium  200 mg Oral Daily  . enoxaparin  40 mg Subcutaneous Q24H  . fenofibrate  160 mg Oral Daily  . furosemide  80 mg Oral BID  . glimepiride  4 mg Oral Q breakfast  . guaiFENesin  600 mg Oral BID  . insulin aspart  0-20 Units Subcutaneous TID WC  . insulin aspart  0-5 Units Subcutaneous QHS  . insulin glargine  20 Units Subcutaneous BID  . metoprolol tartrate  12.5 mg Oral BID  . pantoprazole  40 mg Oral Q1200  . polysaccharide iron  150 mg Oral Daily  . potassium chloride  40 mEq Oral BID  . rosuvastatin  20 mg Oral q1800  . sodium chloride  10-40 mL Intracatheter Q12H  . sodium chloride  3 mL Intravenous Q12H  . sodium chloride  3 mL Intravenous Q12H  Radiology/Studies:  No results found.  INR: Will add last result for INR, ABG once components are confirmed Will add last 4 CBG results once components are confirmed  Assessment/Plan: S/P Procedure(s) (LRB): AORTIC VALVE REPLACEMENT (AVR) (N/A) 1. Conts to improve 2. Cont diuresis 3. Cont current rhythm management 4. cbg 84-173 range 5. Recheck K+, Mg++   LOS: 26 days    [name],[name] 2/10/20139:04 AM    patient examined and medical record reviewed,agree with above note.Needs Inpatient rehab consult VAN TRIGT [name],Antonio 12/09/2011

## 2011-12-09 NOTE — Progress Notes (Signed)
Patient ID: Antonio [name]., male   DOB: 1951/03/10, 61 y.o.   MRN: [id number]   Subjective:  Feels well this am.  No significant dyspnea or chest pain. Now on oral lasix.  Rhythm remains NSR on amiodarone.  Objective:  Vital Signs in the last 24 hours: Temp:  [97.4 F (36.3 C)-98.1 F (36.7 C)] 98.1 F (36.7 C) (02/10 0625) Pulse Rate:  [80-91] 80  (02/10 0625) Resp:  [18-19] 19  (02/10 0625) BP: (118-144)/(55-88) 127/72 mmHg (02/10 0625) SpO2:  [95 %-99 %] 97 % (02/10 0625) Weight:  [159.303 kg (351 lb 3.2 oz)] 159.303 kg (351 lb 3.2 oz) (02/10 0656)  Intake/Output from previous day: 02/09 0701 - 02/10 0700 In: 720 [P.O.:720] Out: 350 [Urine:350]      . amiodarone  400 mg Oral BID  . antiseptic oral rinse  15 mL Mouth Rinse BID  . bisacodyl  10 mg Oral Daily   Or  . bisacodyl  10 mg Rectal Daily  . citalopram  20 mg Oral Daily  . colchicine  0.6 mg Oral BID  . docusate sodium  200 mg Oral Daily  . enoxaparin  40 mg Subcutaneous Q24H  . fenofibrate  160 mg Oral Daily  . furosemide  80 mg Oral BID  . glimepiride  4 mg Oral Q breakfast  . guaiFENesin  600 mg Oral BID  . insulin aspart  0-20 Units Subcutaneous TID WC  . insulin aspart  0-5 Units Subcutaneous QHS  . insulin glargine  20 Units Subcutaneous BID  . metoprolol tartrate  12.5 mg Oral BID  . pantoprazole  40 mg Oral Q1200  . polysaccharide iron  150 mg Oral Daily  . potassium chloride  40 mEq Oral BID  . rosuvastatin  20 mg Oral q1800  . sodium chloride  10-40 mL Intracatheter Q12H  . sodium chloride  3 mL Intravenous Q12H  . sodium chloride  3 mL Intravenous Q12H      . sodium chloride Stopped (12/05/11 2200)    Physical Exam: The patient appears to be in no distress.  Head and neck exam reveals that the pupils are equal and reactive.  The extraocular movements are full.  There is no scleral icterus.  Mouth and pharynx are benign.  No lymphadenopathy.  No carotid bruits.  The jugular venous  pressure is normal.  Thyroid is not enlarged or tender.  Chest is clear to percussion and auscultation.  No rales or rhonchi.  Expansion of the chest is symmetrical.  Heart reveals no abnormal lift or heave. Soft systolic murmur at base.  No gallop or rub.  The abdomen is soft and nontender.  Bowel sounds are normoactive.  There is no hepatosplenomegaly or mass.  There are no abdominal bruits.  Extremities reveal no phlebitis  Plus 2 edema.  Pedal pulses are good.  There is no cyanosis or clubbing.  Neurologic exam is normal strength and no lateralizing weakness.  No sensory deficits.  Integument reveals no rash  Lab Results:  Basename 12/07/11 0510  WBC 9.8  HGB 8.4*  PLT 370    Basename 12/08/11 0630 12/07/11 0510  NA 141 144  K 3.9 3.3*  CL 97 97  CO2 38* 40*  GLUCOSE 144* 118*  BUN 15 17  CREATININE 1.03 0.94   No results found for this basename: TROPONINI:2,CK,MB:2 in the last 72 hours Hepatic Function Panel No results found for this basename: PROT,ALBUMIN,AST,ALT,ALKPHOS,BILITOT,BILIDIR,IBILI in the last 72 hours No results found for  this basename: CHOL in the last 72 hours No results found for this basename: PROTIME in the last 72 hours  Imaging: Imaging results have been reviewed  Cardiac Studies:  Assessment/Plan:  PAF:  NSR continue amiodarone 400 bid and beta blocker CHF:  Continue diuresis improved  Lasix 80 bid oral  Still with dependant edema  Apparantly going to rehab [facility]    LOS: 26 days    Charlton Haws 12/09/2011, 8:49 AM

## 2011-12-09 NOTE — Progress Notes (Signed)
RT Note; Pt wearing CPAP on Auto- titrate with  2 lpm O2  Bled into  nasal mask.

## 2011-12-09 NOTE — Progress Notes (Signed)
12/09/2011 12:05 PM Nursing Note: Patient ambulated 158ft with rolling walker, RN and on room air. Patient tolerated well. Encouraged two more walks this evening.  Malarie Tappen, Blanchard Kelch

## 2011-12-09 NOTE — Progress Notes (Signed)
12/09/2011 5:55 PM Nursing Note: Patient ambulated 200 ft with rolling walker, RN and on RA. Oxygen saturations remained greater than 90% during walk. Patient tolerated well. Encouraged one more walk this evening. Will continue to monitor.  Runa Whittingham, Blanchard Kelch

## 2011-12-10 LAB — BASIC METABOLIC PANEL WITH GFR
BUN: 16 mg/dL (ref 6–23)
CO2: 35 meq/L — ABNORMAL HIGH (ref 19–32)
Calcium: 9.1 mg/dL (ref 8.4–10.5)
Chloride: 95 meq/L — ABNORMAL LOW (ref 96–112)
Creatinine, Ser: 0.98 mg/dL (ref 0.50–1.35)
GFR calc Af Amer: >90 mL/min (ref >90)
GFR calc non Af Amer: 87 mL/min — ABNORMAL LOW (ref >90)
Glucose, Bld: 83 mg/dL (ref 70–99)
Potassium: 3.9 meq/L (ref 3.5–5.1)
Sodium: 140 meq/L (ref 135–145)

## 2011-12-10 LAB — GLUCOSE, CAPILLARY
Glucose-Capillary: 74 mg/dL (ref 70–99)
Glucose-Capillary: 88 mg/dL (ref 70–99)

## 2011-12-10 LAB — MAGNESIUM: Magnesium: 1.9 mg/dL (ref 1.5–2.5)

## 2011-12-10 MED ORDER — FUROSEMIDE 10 MG/ML IJ SOLN
80.0000 mg | Freq: Two times a day (BID) | INTRAMUSCULAR | Status: DC
  Administered 2011-12-10 – 2011-12-16 (×12): 80 mg via INTRAVENOUS
  Filled 2011-12-10 (×15): qty 8

## 2011-12-10 MED ORDER — OXYCODONE HCL 5 MG PO TABS
5.0000 mg | ORAL_TABLET | ORAL | Status: DC | PRN
  Administered 2011-12-10 – 2011-12-16 (×20): 5 mg via ORAL
  Filled 2011-12-10 (×21): qty 1

## 2011-12-10 NOTE — Progress Notes (Signed)
Patient was tired and did not want to ambulate this evening. Will continue to monitor.

## 2011-12-10 NOTE — Progress Notes (Signed)
CARDIAC REHAB PHASE I   PRE:  Rate/Rhythm: 79SR  BP:  Supine:   Sitting: 124/59  Standing:    SaO2: 97%2L  MODE:  Ambulation: 150 ft   POST:  Rate/Rhythem: 95SR  BP:  Supine:   Sitting:   Standing: BSC   SaO2: 89-90%RA 0810-0830 Pt walked 150 ft on RA with rolling walker and asst x 1.  One asst followed with pt's  Wheelchair. Pt cut walk short due to need for BSC. Could have gone farther but afraid he would need BSC. Put him back on oxygen at 2L.  [name]

## 2011-12-10 NOTE — Progress Notes (Signed)
Patient Name: Antonio [name]. Date of Encounter: 12/10/2011    Principal Problem:  *Systolic and diastolic CHF, acute on chronic Active Problems:  DIABETES MELLITUS, TYPE II, UNCONTROLLED  Gout, unspecified  OBESITY, NOS  AORTIC STENOSIS  EDEMA-LEGS,DUE TO VENOUS OBSTRUCT.  Acute on chronic systolic congestive heart failure  PAF (paroxysmal atrial fibrillation)    SUBJECTIVE  No complaints this morning. He denies chest pain, palpitations and shortness of breath.   OBJECTIVE  Filed Vitals:   12/09/11 1442 12/09/11 2300 12/10/11 0536 12/10/11 0600  BP: 112/72 128/68  124/89  Pulse: 74 95  95  Temp: 98 F (36.7 C) 98.4 F (36.9 C)  97.6 F (36.4 C)  TempSrc: Oral Oral  Oral  Resp: 18 20  22   Height:      Weight:   159.757 kg (352 lb 3.2 oz)   SpO2: 94% 95%  93%    Intake/Output Summary (Last 24 hours) at 12/10/11 4098 Last data filed at 12/10/11 0525  Gross per 24 hour  Intake    960 ml  Output    350 ml  Net    610 ml   Weight change: 0.454 kg (1 lb)  PHYSICAL EXAM  General: Well developed, well nourished, NAD Head: Normocephalic, atraumatic, sclera non-icteric, no xanthomas Neck: Supple without bruits or JVD. Lungs:  Resp regular and unlabored, CTAB without wheezes, rales or rhonchi Heart: RRR no s3, s4, or murmurs. Abdomen: Soft, non-tender, non-distended, BS + x 4.  Msk:  Strength and tone appears normal for age. Extremities: 1+ pedal edema. No clubbing or cyanosis. DP/PT/Radials 2+ and equal bilaterally. Neuro: Alert and oriented X 3. Moves all extremities spontaneously. Psych: Normal affect.  LABS:   Lab 12/10/11 0510 12/08/11 0630 12/07/11 0510  NA 140 141 144  K 3.9 3.9 3.3*  CL 95* 97 97  CO2 35* 38* 40*  BUN 16 15 17   CREATININE 0.98 1.03 0.94  CALCIUM 9.1 9.4 9.1  PROT -- -- --  BILITOT -- -- --  ALKPHOS -- -- --  ALT -- -- --  AST -- -- --  AMYLASE -- -- --  LIPASE -- -- --  GLUCOSE 83 144* 118*   TELE: NSR, 70-80 bpm, no  ST-T wave changes  ECG: No new tracing  Radiology/Studies:  Dg Orthopantogram  11/17/2011  *RADIOLOGY REPORT*  Clinical Data: Oral evaluation prior to anticipated valve surgery.  PANOREX ONE-VIEW  Comparison: None.  Findings: Study is motion limited.  The patient was unable to stand for the examination.  There is prominent midline artifact, limiting evaluation of the central and lateral incisors.  Some of the molars are absent.  No significant periodontal disease is identified. There is no evidence of acute fracture.  The temporal mandibular joints are not imaged.  IMPRESSION: Limited study shows no evidence of significant periodontal disease. Midline evaluation is not possible.  Original Report Authenticated By: Gerrianne Scale, M.D.   Dg Chest 1 View  11/29/2011  *RADIOLOGY REPORT*  Clinical Data: Hypotension  CHEST - 1 VIEW  Comparison: Earlier film of the same day  Findings: Endotracheal tube, nasogastric tube, and mediastinal drain are stable.  Right IJ Swan-Ganz has been advanced slightly into the pulmonary outflow tract.  Low lung volumes with perihilar and infrahilar atelectasis or consolidation, left greater than right as before.  No definite pleural effusion.  Mild cardiomegaly stable.  No pneumothorax.  IMPRESSION:  1.  Advancement of Swan-Ganz into the pulmonary outflow tract. 2.  Stable asymmetric atelectasis or infiltrates.  Original Report Authenticated By: Osa Craver, M.D.   Dg Chest 1 View  11/16/2011  *RADIOLOGY REPORT*  Clinical Data: Follow up opacity in the right lung  CHEST - 1 VIEW  Comparison: Chest x-ray of 11/15/2011  Findings: The lungs are not as well aerated.  The vague opacity noted previously the right upper lung field is not seen and may represent resolving pneumonia or edema. There does appear to be minimal pulmonary vascular congestion present.  Cardiomegaly is stable.  IMPRESSION: Stable cardiomegaly.  No definite infiltrate or effusion. Probable minimal  pulmonary vascular congestion.  Original Report Authenticated By: Juline Patch, M.D.   Dg Chest 2 View  12/06/2011  *RADIOLOGY REPORT*  Clinical Data: Postop, shortness of breath  CHEST - 2 VIEW  Comparison: 12/05/2011  Findings: Cardiomegaly again noted.  Status post median sternotomy and cardiac valve replacement.  Stable right PICC line position with tip in right atrium .No diagnostic pneumothorax.  Slight improved in aeration. Central mild vascular congestion without pulmonary edema.  Probable small bilateral pleural effusion with mild basilar atelectasis or infiltrate.  Linear atelectasis or scarring in the lingula and right perihilar. Degenerative changes thoracic spine.  IMPRESSION:  Stable right PICC line position with tip in right atrium .No diagnostic pneumothorax.  Slight improved in aeration. Central mild vascular congestion without pulmonary edema.  Probable small bilateral pleural effusion with mild basilar atelectasis or infiltrate.  Linear atelectasis or scarring in the lingula and right perihilar.  Original Report Authenticated By: Natasha Mead, M.D.   Dg Chest 2 View  11/15/2011  *RADIOLOGY REPORT*  Clinical Data: CHF and elevated white blood cell count  CHEST - 2 VIEW  Comparison: 04/01/2011  Findings: There is moderate cardiac enlargement.  Atelectasis is noted within the left base.  Unchanged from previous exam.  Interstitial edema pattern is noted with worsening aeration to the right upper lobe.  IMPRESSION:  1.  Slight increased opacification of the right upper lobe. 2.  CHF.  Original Report Authenticated By: Rosealee Albee, M.D.   Dg Chest Port 1 View  12/05/2011  *RADIOLOGY REPORT*  Clinical Data: Status post aortic valve replacement.  PORTABLE CHEST - 1 VIEW  Comparison: Plain film chest 12/03/2011 and 12/02/2011.  Findings: The patient's right IJ sheath has been removed.  A new right PICC is identified with the tip in the lower superior vena cava.  There has been interval increase  in the right greater than left pleural effusions and basilar airspace disease.  There is cardiomegaly and pulmonary vascular congestion.  IMPRESSION: Increased right greater than left effusions and airspace disease.  Cardiomegaly and pulmonary vascular congestion.  Original Report Authenticated By: Bernadene Bell. D'ALESSIO, M.D.   Dg Chest Portable 1 View In Am  12/01/2011  *RADIOLOGY REPORT*  Clinical Data: Cardiac surgery.  PORTABLE CHEST - 1 VIEW  Comparison: 11/30/2011.  Findings: Endotracheal tube, nasogastric tube, right IJ central line and Swan Ganz catheter are unchanged.  Basilar atelectasis and airspace disease is present.  Cardiomegaly.  Postop changes of median sternotomy.  Bilateral pleural effusions are present. Retrocardiac density is present compatible with atelectasis.  No pneumothorax.  Mediastinal drains unchanged.  IMPRESSION:  1.  Stable support apparatus. 2.  No interval change in bilateral effusions, basilar atelectasis and airspace disease.  Original Report Authenticated By: Andreas Newport, M.D.   Dg Chest Portable 1 View In Am  11/30/2011  *RADIOLOGY REPORT*  Clinical Data: Status post cardiac surgery  PORTABLE CHEST - 1 VIEW  Comparison: Multiple recent previous exams.  Findings: Endotracheal tube tip is 5.7 cm above the base of the carina.  The right IJ pulmonary artery catheter tip is in the main pulmonary artery. The NG tube passes into the stomach although the distal tip position is not included on the film.  Midline pericardial/mediastinal drain remains in place.  Lung volumes are low without evidence for pneumothorax.  There is left greater than right bibasilar atelectasis.  IMPRESSION: No substantial change exam.  Original Report Authenticated By: ERIC A. MANSELL, M.D.   Dg Chest Portable 1 View  11/29/2011  *RADIOLOGY REPORT*  Clinical Data: Status post CABG  PORTABLE CHEST - 1 VIEW  Comparison: 11/16/2011  Findings:  The ET tube tip is above the carina.  There is a Swan-Ganz  catheter with tip in the right ventricle. Nasogastric tube is in place.  The heart size appears enlarged.  There are low lung volumes, small effusions and pulmonary venous congestion.  IMPRESSION:  1. Mild CHF. 2.  Support apparatus as described above.  Original Report Authenticated By: Rosealee Albee, M.D.   Dg Chest Port 1v Same Day  12/03/2011  *RADIOLOGY REPORT*  Clinical Data: Status post aortic valve replacement  CHEST - 1 VIEW  Comparison: 12/02/2011  Findings: Overall aeration has improved bilaterally with areas of subsegmental atelectasis remaining in the perihilar regions and lower lung zones.  There is no evidence of pneumothorax or overt pulmonary edema.  Mediastinal contours are stable.  IMPRESSION: Improved aeration.  Original Report Authenticated By: Reola Calkins, M.D.   Dg Chest Port 1v Same Day  12/02/2011  *RADIOLOGY REPORT*  Clinical Data: Aortic valve replacement, postoperative exam, extubated  PORTABLE CHEST - 1 VIEW SAME DAY  Comparison: 12/01/2011  Findings: Endotracheal tube, NG tube, Swan-Ganz catheter removed. Right IJ vascular sheath remains.  Heart remains enlarged with patchy bilateral perihilar and lower lobe airspace disease/atelectasis.  Pleural effusions remain.  No pneumothorax. No significant interval change.  IMPRESSION: Extubated.  Otherwise stable portable chest exam.  Original Report Authenticated By: Judie Petit. Ruel Favors, M.D.    Current Medications:     . amiodarone  400 mg Oral BID  . antiseptic oral rinse  15 mL Mouth Rinse BID  . bisacodyl  10 mg Oral Daily   Or  . bisacodyl  10 mg Rectal Daily  . citalopram  20 mg Oral Daily  . colchicine  0.6 mg Oral BID  . docusate sodium  200 mg Oral Daily  . enoxaparin  40 mg Subcutaneous Q24H  . fenofibrate  160 mg Oral Daily  . furosemide  80 mg Oral BID  . glimepiride  4 mg Oral Q breakfast  . guaiFENesin  600 mg Oral BID  . insulin aspart  0-20 Units Subcutaneous TID WC  . insulin aspart  0-5 Units  Subcutaneous QHS  . insulin glargine  20 Units Subcutaneous BID  . metoprolol tartrate  12.5 mg Oral BID  . pantoprazole  40 mg Oral Q1200  . polysaccharide iron  150 mg Oral Daily  . potassium chloride  40 mEq Oral BID  . rosuvastatin  20 mg Oral q1800  . sodium chloride  10-40 mL Intracatheter Q12H  . sodium chloride  3 mL Intravenous Q12H  . sodium chloride  3 mL Intravenous Q12H    ASSESSMENT AND PLAN:  1. Paroxysmal atrial fibrillation- remains in NSR. Continue Amiodarone 400 bid and Lopressor.   2. Chronic diastolic CHF- had diuresed  well on Lasix IV, switched to PO over the weekend. States he has gained weight since being off Lasix IV. Looking back, he has gained ~ 5lbs since 02/08. Discussed with Dr. Clifton James who knows him well. Recommend transition back to Lasix IV as he has required extensive diuresis prior to and after AVR. BP and Cr WNL.   Signed, R. Hurman Horn, PA-C 12/10/2011, 8:26 AM

## 2011-12-10 NOTE — Progress Notes (Signed)
   CARE MANAGEMENT NOTE 12/10/2011  Patient:  Antonio [name], Antonio [name]   Account Number:  [id number]  Date Initiated:  11/30/2011  Documentation initiated by:  [facility]  Subjective/Objective Assessment:   Post op Valve on 11-29-11 - has spouse - but planning for SNF     Action/Plan:   PTA, PT INDEPENDENT, LIVES WITH SPOUSE WHO WORKS.   Anticipated DC Date:  12/04/2011   Anticipated DC Plan:  SKILLED NURSING FACILITY  In-house referral  Clinical Social Worker      DC Planning Services  CM consult      Choice offered to / List presented to:             Status of service:  In process, will continue to follow Medicare Important Message given?   (If response is "NO", the following Medicare IM given date fields will be blank) Date Medicare IM given:   Date Additional Medicare IM given:    Discharge Disposition:    Per UR Regulation:  Reviewed for med. necessity/level of care/duration of stay  Comments:  12/10/11 Jamea Robicheaux,RN,BSN 1536 CIR WILL NOT ACCEPT PT FOR ADMISSION.  CSW COORDINATING DISCHARGE TO SNF FOR REHAB.  WILL FOLLOW ASSIST WITH DISPOSITION AS NEEDED. Phone #[phone]   11/30/11  Fathima Bartl,RN,BSN 1510 PT WILL NEED SHORT TERM SNF AT DISCHARGE.  WILL CONSULT CSW TO FACILITATE DC TO SNF WHEN MEDICALLY STABLE. Phone #[phone]

## 2011-12-10 NOTE — Progress Notes (Signed)
CSW advised no CIR for this pt. Working on [employer] placement and insurance authorization. MD please advise of estimated d/c when possible.  Baxter Flattery, MSW [phone]

## 2011-12-10 NOTE — Progress Notes (Signed)
I met with patient at bedside. As expected by Dr. Riley Kill on 2/6, patient not in need of intense inpatient acute rehabilitation at this time. I discussed with patient and he is aware that I am recommending SNF rehab for he needs to be independeent to return home. His wife can take off only a few days at d/c due to her job obligations. I have alerted SW of need for SNF. Please call me with any questions. Pager [phone]

## 2011-12-10 NOTE — Progress Notes (Addendum)
11 Days Post-Op Procedure(s) (LRB): AORTIC VALVE REPLACEMENT (AVR) (N/A)  Subjective: Patient without complaints this am.  Objective: Vital signs in last 24 hours: Patient Vitals for the past 24 hrs:  BP Temp Temp src Pulse Resp SpO2 Weight  12/10/11 0600 124/89 mmHg 97.6 F (36.4 C) Oral 95  22  93 % -  12/10/11 0536 - - - - - - 352 lb 3.2 oz (159.757 kg)  12/09/11 2300 128/68 mmHg 98.4 F (36.9 C) Oral 95  20  95 % -  12/09/11 1442 112/72 mmHg 98 F (36.7 C) Oral 74  18  94 % -  12/09/11 0956 134/65 mmHg - - 88  - - -   Pre op weight  158.3 kg Current Weight  12/10/11 352 lb 3.2 oz (159.757 kg)      Intake/Output from previous day: 02/10 0701 - 02/11 0700 In: 1200 [P.O.:1200] Out: 350 [Urine:350]   Physical Exam:  Cardiovascular: RRR, no murmurs, gallops, or rubs. Pulmonary: Slightly decreased at bilaterally; no rales, wheezes, or rhonchi. Abdomen: Soft, non tender, bowel sounds present. Extremities: Bilateral lower extremity edema. Wounds: Clean and dry.  No erythema or signs of infection.  Lab Results: CBC:No results found for this basename: WBC:2,HGB:2,HCT:2,PLT:2 in the last 72 hours BMET:  [facility] 12/10/11 0510 12/08/11 0630  NA 140 141  K 3.9 3.9  CL 95* 97  CO2 35* 38*  GLUCOSE 83 144*  BUN 16 15  CREATININE 0.98 1.03  CALCIUM 9.1 9.4    PT/INR: No results found for this basename: LABPROT,INR in the last 72 hours ABG:  INR: Will add last result for INR, ABG once components are confirmed Will add last 4 CBG results once components are confirmed  Assessment/Plan:  1. CV - SR. Continue Amiodarone 400 bid, Lopressor 12.5 bid. 2.  Pulmonary - Encourage incentive spirometer.Was off O2 for a bit yesterday but then had decrease O2 sat into low 80's at night. Patient with a history of OSA (on CPAP) 3. Volume Overload - As discussed with PA from [facility], will change Lasix to IV for better mobilization of fluid. 4.  Acute blood loss anemia - Last H/H  8.4/26.7.Continue Niferex. Check in am. 5.DM-CBGs 121/102/74. Continue with current medictions. 6.Continue CRPI.  ZIMMERMAN,DONIELLE MPA-C 12/10/2011   patient examined and medical record reviewed,agree with above note. VAN TRIGT III,PETER 12/10/2011   Plan admission to CIR

## 2011-12-10 NOTE — Progress Notes (Signed)
UR Completed.  Patton Swisher Jane [phone] 12/10/2011  

## 2011-12-10 NOTE — Progress Notes (Signed)
Occupational Therapy Treatment Patient Details Name: Antonio [name]. MRN: [id number] DOB: 04-04-1951 Today's Date: 12/10/2011  OT Assessment/Plan OT Assessment/Plan Comments on Treatment Session: Pt. educated on use of AE for completing LB ADLs and for energy conservation techniques. OT Plan: Discharge plan remains appropriate OT Frequency: Min 2X/week Follow Up Recommendations: Skilled nursing facility Equipment Recommended: Defer to next venue OT Goals Acute Rehab OT Goals OT Goal Formulation: With patient Time For Goal Achievement: 2 weeks ADL Goals Pt Will Perform Grooming: with supervision;Standing at sink;Other (comment) ADL Goal: Grooming - Progress: Progressing toward goals Pt Will Perform Upper Body Bathing: Sitting, edge of bed;with supervision;with set-up;with adaptive equipment ADL Goal: Upper Body Bathing - Progress: Progressing toward goals Pt Will Perform Lower Body Bathing: with min assist;Sit to stand from bed;Sitting, edge of bed;with adaptive equipment ADL Goal: Lower Body Bathing - Progress: Progressing toward goals Pt Will Perform Upper Body Dressing: with set-up;Sitting, bed ADL Goal: Upper Body Dressing - Progress: Progressing toward goals Pt Will Perform Lower Body Dressing: with adaptive equipment;Sit to stand from bed;Sitting, bed;with min assist ADL Goal: Lower Body Dressing - Progress: Progressing toward goals Pt Will Transfer to Toilet: with supervision;Extra wide 3-in-1;Other (comment);Ambulation ADL Goal: Toilet Transfer - Progress: Progressing toward goals Pt Will Perform Toileting - Clothing Manipulation: with supervision;Standing ADL Goal: Toileting - Clothing Manipulation - Progress: Progressing toward goals Pt Will Perform Toileting - Hygiene: Independently;Sit to stand from 3-in-1/toilet ADL Goal: Toileting - Hygiene - Progress: Progressing toward goals Miscellaneous OT Goals Miscellaneous OT Goal #1: Pt will adhere to sternal precautions during  mobility and ADL without cues. OT Goal: Miscellaneous Goal #1 - Progress: Met Miscellaneous OT Goal #2: Pt will utilize energy conservation strategies in ADL without cues. OT Goal: Miscellaneous Goal #2 - Progress: Progressing toward goals  OT Treatment Precautions/Restrictions  Precautions Precautions: Sternal;Fall Restrictions Weight Bearing Restrictions: No   ADL ADL Lower Body Bathing: Simulated;Set up Lower Body Bathing Details (indicate cue type and reason): Min verbal cues for use of long handled sponge to complete Where Assessed - Lower Body Bathing: Sitting, bed;Sit to stand from bed Lower Body Dressing: Performed;Moderate assistance Lower Body Dressing Details (indicate cue type and reason): With donning/doffing bil socks and use of sock aid/reacher to complete. Use of reacher to simulate LB undergarments and pants with use of pillow case. Where Assessed - Lower Body Dressing: Sitting, bed;Sit to stand from bed ADL Comments: Pt. able to recall sternal precautions independently. Pt. educated on use of AE for completing LB ADLs. Pt. able to recall 2 energy conservation techniques. Limited activity during session due to IV team present. Mobility  Bed Mobility Bed Mobility: Yes Supine to Sit: 4: Min assist;With rails;HOB elevated (Comment degrees) Sitting - Scoot to Edge of Bed: 5: Supervision;With rail Transfers Transfers: Yes Sit to Stand: From bed;4: Min assist;From elevated surface Sit to Stand Details (indicate cue type and reason): min verbal cues for hand placement on bed to push up from EOB     End of Session OT - End of Session Activity Tolerance: Patient limited by fatigue Patient left: in bed;with call bell in reach Nurse Communication: Mobility status for transfers General Behavior During Session: [facility] for tasks performed Cognition: [facility] for tasks performed  Teon Hudnall, OTR/L Pager [phone]  12/10/2011, 12:39 PM

## 2011-12-10 NOTE — Progress Notes (Signed)
1810 - pt ambulated 273ft with RW, pt stopped twice to catch his breath, steady gait.  Will continue to monitor.  Ninetta Lights RN 12/10/2011

## 2011-12-11 LAB — CBC
HCT: 28.2 % — ABNORMAL LOW (ref 39.0–52.0)
Hemoglobin: 8.8 g/dL — ABNORMAL LOW (ref 13.0–17.0)
RDW: 15.5 % (ref 11.5–15.5)
WBC: 8.8 10*3/uL (ref 4.0–10.5)

## 2011-12-11 LAB — GLUCOSE, CAPILLARY
Glucose-Capillary: 106 mg/dL — ABNORMAL HIGH (ref 70–99)
Glucose-Capillary: 152 mg/dL — ABNORMAL HIGH (ref 70–99)
Glucose-Capillary: 122 mg/dL — ABNORMAL HIGH (ref 70–99)

## 2011-12-11 NOTE — Progress Notes (Signed)
Physical Therapy Treatment Patient Details Name: Tyrus Wilms. MRN: [id number] DOB: Nov 15, 1950 Today's Date: 12/11/2011  PT Assessment/Plan  PT - Assessment/Plan Comments on Treatment Session: Pt s/p AVR. Pt continues to progress with ambulation on RA. Dgtr pushed WC behind pt per his request with seated rest due to back pain. Discussed with pt sequence for stair ambulation and home modifications based on mobility should they be needed. Will attempt stairs next session. Pt required assist to complete pericare. Pt able to state all sternal precautions independently today. PT Plan: Discharge plan remains appropriate;Frequency remains appropriate PT Goals  Acute Rehab PT Goals PT Goal: Sit to Stand - Progress: Progressing toward goal Pt will go Stand to Sit: with modified independence PT Goal: Stand to Sit - Progress: Updated due to goals met Pt will Transfer Bed to Chair/Chair to Bed: with modified independence PT Transfer Goal: Bed to Chair/Chair to Bed - Progress: Updated due to goal met Pt will Ambulate: with modified independence;>150 feet;with least restrictive assistive device PT Goal: Ambulate - Progress: Updated due to goal met Additional Goals PT Goal: Additional Goal #1 - Progress: Progressing toward goal  PT Treatment Precautions/Restrictions  Precautions Precautions: Sternal;Fall Required Braces or Orthoses: No Restrictions Weight Bearing Restrictions: No Mobility (including Balance) Bed Mobility Bed Mobility: No Transfers Sit to Stand: 4: Min assist;From chair/3-in-1 Sit to Stand Details (indicate cue type and reason): cueing for hand placement and min assist to fully achieve anterior translation x 3 trials Stand to Sit: To chair/3-in-1;5: Supervision Stand to Sit Details: cueing for hand placement and safety Stand Pivot Transfers: 5: Supervision Stand Pivot Transfer Details (indicate cue type and reason): with RW WC to [facility] Ambulation/Gait Ambulation/Gait:  Yes Ambulation/Gait Assistance: 5: Supervision Ambulation/Gait Assistance Details (indicate cue type and reason): cueing to extend trunk and step into RW Ambulation Distance (Feet): 350 Feet (seated rest 3 min half way ) Assistive device: Rolling walker Gait Pattern: Step-through pattern;Decreased stride length;Trunk flexed  Posture/Postural Control Posture/Postural Control: No significant limitations Exercise  General Exercises - Lower Extremity Long Arc Quad: AROM;20 reps;Both;Seated Hip Flexion/Marching: AROM;Both;Other reps (comment);Seated (20reps) End of Session PT - End of Session Activity Tolerance: Patient tolerated treatment well Patient left: in chair;with call bell in reach;with family/visitor present Nurse Communication: Mobility status for transfers;Mobility status for ambulation General Behavior During Session: [facility] for tasks performed Cognition: [facility] for tasks performed  Delorse Lek 12/11/2011, 3:27 PM Toney Sang, PT [phone]

## 2011-12-11 NOTE — Progress Notes (Signed)
83SRCARDIAC REHAB PHASE I   PRE:  Rate/Rhythm: 83 SR  BP:  Supine:   Sitting: 140/82  Standing:    SaO2: 92 RA  MODE:  Ambulation: 330 ft   POST:  Rate/Rhythem: 104  BP:  Supine:   Sitting: 138/80  Standing:    SaO2: 89-94 RA 1055-1135 Assisted X 2 and used walker to ambulate. Gait steady with walker. Able to walk 330 ft with standing rest stops. DOE but talks nonstop. RA sat after walk 890%, but increased to 94% with rest and time. Pt to wheelchair after walk with call light in reach and feet elevated.  Beatrix Fetters

## 2011-12-11 NOTE — Progress Notes (Signed)
Pt with few bed offers, considering [facility]. CSW awaiting response from [employer] facilities. MD, please advise of anticipated d/c.  Baxter Flattery, MSW [phone]

## 2011-12-11 NOTE — Progress Notes (Signed)
Placed pt. On CPAP auto titrate (min: 7, Max: 12) via pt.'s home nasal mask with 3L O2 bled in. Pt. Is tolerating CPAP well at this time.

## 2011-12-11 NOTE — Progress Notes (Addendum)
[name]            [place] [address]          [phone]     12 Days Post-Op  Procedure(s) (LRB): AORTIC VALVE REPLACEMENT (AVR) (N/A) Subjective: Slowly feeling stronger  Objective  Telemetry NSR  Temp:  [97.5 F (36.4 C)-98.4 F (36.9 C)] 98.1 F (36.7 C) (02/12 0456) Pulse Rate:  [77-84] 79  (02/12 0456) Resp:  [14-20] 14  (02/12 0456) BP: (112-146)/(60-87) 146/60 mmHg (02/12 0456) SpO2:  [92 %-94 %] 93 % (02/12 0456) Weight:  [347 lb 9.6 oz (157.67 kg)] 347 lb 9.6 oz (157.67 kg) (02/12 0456)   Intake/Output Summary (Last 24 hours) at 12/11/11 0927 Last data filed at 12/11/11 0800  Gross per 24 hour  Intake    240 ml  Output   1100 ml  Net   -860 ml       General appearance: alert and no distress Heart: regular rate and rhythm and S1, S2 normal Lungs: mildly diminished in bases Abdomen: soft, nontender, non distended Extremities: + BLE edema Wound: incisions healing well   Lab Results:  Basename 12/10/11 0510  NA 140  K 3.9  CL 95*  CO2 35*  GLUCOSE 83  BUN 16  CREATININE 0.98  CALCIUM 9.1  MG 1.9  PHOS --   No results found for this basename: AST:2,ALT:2,ALKPHOS:2,BILITOT:2,PROT:2,ALBUMIN:2 in the last 72 hours No results found for this basename: LIPASE:2,AMYLASE:2 in the last 72 hours  Basename 12/11/11 0556  WBC 8.8  NEUTROABS --  HGB 8.8*  HCT 28.2*  MCV 87.3  PLT 339   No results found for this basename: CKTOTAL:4,CKMB:4,TROPONINI:4 in the last 72 hours No components found with this basename: POCBNP:3 No results found for this basename: DDIMER in the last 72 hours No results found for this basename: HGBA1C in the last 72 hours No results found for this basename: CHOL,HDL,LDLCALC,TRIG,CHOLHDL in the last 72 hours No results found for this basename: TSH,T4TOTAL,FREET3,T3FREE,THYROIDAB in the last 72 hours No results found for this basename: VITAMINB12,FOLATE,FERRITIN,TIBC,IRON,RETICCTPCT in the  last 72 hours  Medications: Scheduled    . amiodarone  400 mg Oral BID  . antiseptic oral rinse  15 mL Mouth Rinse BID  . bisacodyl  10 mg Oral Daily   Or  . bisacodyl  10 mg Rectal Daily  . citalopram  20 mg Oral Daily  . colchicine  0.6 mg Oral BID  . docusate sodium  200 mg Oral Daily  . enoxaparin  40 mg Subcutaneous Q24H  . fenofibrate  160 mg Oral Daily  . furosemide  80 mg Intravenous BID  . glimepiride  4 mg Oral Q breakfast  . guaiFENesin  600 mg Oral BID  . insulin aspart  0-20 Units Subcutaneous TID WC  . insulin aspart  0-5 Units Subcutaneous QHS  . insulin glargine  20 Units Subcutaneous BID  . metoprolol tartrate  12.5 mg Oral BID  . pantoprazole  40 mg Oral Q1200  . polysaccharide iron  150 mg Oral Daily  . potassium chloride  40 mEq Oral BID  . rosuvastatin  20 mg Oral q1800  . sodium chloride  3 mL Intravenous Q12H  . DISCONTD: sodium chloride  10-40 mL Intracatheter Q12H  . DISCONTD: sodium chloride  3 mL Intravenous Q12H     Radiology/Studies:  No results found.  INR: Will add  last result for INR, ABG once components are confirmed Will add last 4 CBG results once components are confirmed  Assessment/Plan: S/P Procedure(s) (LRB): AORTIC VALVE REPLACEMENT (AVR) (N/A)  1. Cont iv Lasix as per cardiology recs 2. Push rehab as able/ pulm toilet 3. CBG well controlled  Antonio [name],Antonio [name] 2/12/20139:27 AM    I have seen and examined the patient and agree with the assessment and plan as outlined.  Purcell Nails 12/11/2011 7:35 PM

## 2011-12-11 NOTE — Progress Notes (Signed)
SUBJECTIVE: No chest pain or SOB. No events.   BP 146/60  Pulse 79  Temp(Src) 98.1 F (36.7 C) (Oral)  Resp 14  Ht 6\' 1"  (1.854 m)  Wt 347 lb 9.6 oz (157.67 kg)  BMI 45.86 kg/m2  SpO2 93%  Intake/Output Summary (Last 24 hours) at 12/11/11 0745 Last data filed at 12/10/11 2230  Gross per 24 hour  Intake      0 ml  Output   1100 ml  Net  -1100 ml    PHYSICAL EXAM General: Well developed, well nourished, in no acute distress. Alert and oriented x 3.  Psych:  Good affect, responds appropriately Neck: No JVD. No masses noted.  Lungs: Clear bilaterally with no wheezes or rhonci noted.  Heart: RRR with no murmurs noted. Abdomen: Bowel sounds are present. Soft, non-tender.  Extremities: 1+ bilateral lower extremity edema.   LABS: Basic Metabolic Panel:  Basename 12/10/11 0510  NA 140  K 3.9  CL 95*  CO2 35*  GLUCOSE 83  BUN 16  CREATININE 0.98  CALCIUM 9.1  MG 1.9  PHOS --   CBC:  Basename 12/11/11 0556  WBC 8.8  NEUTROABS --  HGB 8.8*  HCT 28.2*  MCV 87.3  PLT 339    Current Meds:    . amiodarone  400 mg Oral BID  . antiseptic oral rinse  15 mL Mouth Rinse BID  . bisacodyl  10 mg Oral Daily   Or  . bisacodyl  10 mg Rectal Daily  . citalopram  20 mg Oral Daily  . colchicine  0.6 mg Oral BID  . docusate sodium  200 mg Oral Daily  . enoxaparin  40 mg Subcutaneous Q24H  . fenofibrate  160 mg Oral Daily  . furosemide  80 mg Intravenous BID  . glimepiride  4 mg Oral Q breakfast  . guaiFENesin  600 mg Oral BID  . insulin aspart  0-20 Units Subcutaneous TID WC  . insulin aspart  0-5 Units Subcutaneous QHS  . insulin glargine  20 Units Subcutaneous BID  . metoprolol tartrate  12.5 mg Oral BID  . pantoprazole  40 mg Oral Q1200  . polysaccharide iron  150 mg Oral Daily  . potassium chloride  40 mEq Oral BID  . rosuvastatin  20 mg Oral q1800  . sodium chloride  3 mL Intravenous Q12H  . DISCONTD: furosemide  80 mg Oral BID  . DISCONTD: sodium chloride   10-40 mL Intracatheter Q12H  . DISCONTD: sodium chloride  3 mL Intravenous Q12H     ASSESSMENT AND PLAN:  1. Aortic stenosis: now s/p AVR with bioprosthetic valve. Doing well. Slow progress.  2. Paroxysmal atrial fibrillation- remains in NSR. He is on amiodarone and Lopressor. He should not need the amiodarone long term.   3. Chronic diastolic CHF- He has been volume overloaded since surgery. Agree with IV Lasix today. Diuresed well overnight with evening dose of IV Lasix. I think he will need another 24-48 hours of diuresis before discharge.   Antonio [name]  2/12/20137:45 AM

## 2011-12-12 ENCOUNTER — Ambulatory Visit: Payer: BC Managed Care – PPO | Admitting: Cardiothoracic Surgery

## 2011-12-12 LAB — GLUCOSE, CAPILLARY
Glucose-Capillary: 198 mg/dL — ABNORMAL HIGH (ref 70–99)
Glucose-Capillary: 129 mg/dL — ABNORMAL HIGH (ref 70–99)
Glucose-Capillary: 132 mg/dL — ABNORMAL HIGH (ref 70–99)

## 2011-12-12 MED ORDER — POTASSIUM CHLORIDE CRYS ER 20 MEQ PO TBCR: 40.0000 meq | EXTENDED_RELEASE_TABLET | Freq: Two times a day (BID) | ORAL | Status: DC

## 2011-12-12 MED ORDER — METFORMIN HCL 500 MG PO TABS
500.0000 mg | ORAL_TABLET | Freq: Two times a day (BID) | ORAL | Status: DC
  Administered 2011-12-12 – 2011-12-17 (×11): 500 mg via ORAL
  Filled 2011-12-12 (×12): qty 1

## 2011-12-12 MED ORDER — AMIODARONE HCL 400 MG PO TABS: ORAL_TABLET | ORAL | Status: DC

## 2011-12-12 MED ORDER — METOPROLOL TARTRATE 12.5 MG HALF TABLET: 12.5000 mg | ORAL_TABLET | Freq: Two times a day (BID) | ORAL | Status: DC

## 2011-12-12 MED ORDER — FUROSEMIDE 80 MG PO TABS: 80.0000 mg | ORAL_TABLET | Freq: Two times a day (BID) | ORAL | Status: DC

## 2011-12-12 MED ORDER — METFORMIN HCL 500 MG PO TABS: 1000.0000 mg | ORAL_TABLET | Freq: Two times a day (BID) | ORAL | Status: DC

## 2011-12-12 MED ORDER — GLIMEPIRIDE 4 MG PO TABS
4.0000 mg | ORAL_TABLET | Freq: Two times a day (BID) | ORAL | Status: DC
  Administered 2011-12-12 – 2011-12-17 (×10): 4 mg via ORAL
  Filled 2011-12-12 (×13): qty 1

## 2011-12-12 MED ORDER — POTASSIUM CHLORIDE CRYS ER 20 MEQ PO TBCR: 40.0000 meq | EXTENDED_RELEASE_TABLET | Freq: Every day | ORAL | Status: DC

## 2011-12-12 MED ORDER — FUROSEMIDE 80 MG PO TABS: 40.0000 mg | ORAL_TABLET | Freq: Every day | ORAL | Status: DC

## 2011-12-12 MED ORDER — METFORMIN HCL 1000 MG PO TABS: 500.0000 mg | ORAL_TABLET | Freq: Two times a day (BID) | ORAL | Status: DC

## 2011-12-12 MED ORDER — OXYCODONE HCL 5 MG PO TABS: 5.0000 mg | ORAL_TABLET | ORAL | Status: DC | PRN

## 2011-12-12 MED ORDER — POLYSACCHARIDE IRON 150 MG PO CAPS: 150.0000 mg | ORAL_CAPSULE | Freq: Every day | ORAL | Status: DC

## 2011-12-12 NOTE — Progress Notes (Signed)
13 Days Post-Op Procedure(s) (LRB): AORTIC VALVE REPLACEMENT (AVR) (N/A)  Subjective: Patient with occassional nausea, but denies emesis.Also, with loose stools.  Objective: Vital signs in last 24 hours: Patient Vitals for the past 24 hrs:  BP Temp Temp src Pulse Resp SpO2 Weight  12/12/11 0552 134/93 mmHg 97.7 F (36.5 C) Oral 78  20  91 % 345 lb 10.9 oz (156.8 kg)  12/11/11 2050 133/81 mmHg 98.5 F (36.9 C) Oral 85  20  91 % -  12/11/11 1428 - - - 90  - 100 % -  12/11/11 1419 - - - 91  - 90 % -  12/11/11 1321 109/57 mmHg 98.4 F (36.9 C) Oral 82  18  92 % -   Pre op weight  158.3 kg Current Weight  12/12/11 345 lb 10.9 oz (156.8 kg)      Intake/Output from previous day: 02/12 0701 - 02/13 0700 In: 960 [P.O.:960] Out: 1275 [Urine:1275]   Physical Exam:  Cardiovascular: RRR, no murmurs, gallops, or rubs. Pulmonary: Clear to auscultation bilaterally; no rales, wheezes, or rhonchi. Abdomen: Soft, non tender, bowel sounds present. Extremities: Mild bilateral lower extremity edema. Wounds: Clean and dry.  No erythema or signs of infection.  Lab Results: CBC: Basename 12/11/11 0556  WBC 8.8  HGB 8.8*  HCT 28.2*  PLT 339   BMET:  Basename 12/10/11 0510  NA 140  K 3.9  CL 95*  CO2 35*  GLUCOSE 83  BUN 16  CREATININE 0.98  CALCIUM 9.1    PT/INR: No results found for this basename: LABPROT,INR in the last 72 hours ABG:  INR: Will add last result for INR, ABG once components are confirmed Will add last 4 CBG results once components are confirmed  Assessment/Plan:  1. CV - Previous PAF.Maintaining SR. Continue Amiodarone 400 bid and Lopressor 12.5 bid. 2.  Pulmonary - Encourage incentive spirometer. 3. Volume Overload - On Lasix 80 iv bid. Consider decreasing to 40 bid soon. 4.  Acute blood loss anemia - Last H/H 8.8/28.2.Continue Ni ferex. 5. Possibly to SNF in 1-2 days. 6.DM- CBGs 122/147/116.Pre op HGA1C 8.5. Increase Amaryl to bid as taken pre op. Start  Metformin at decreased dose. Stop scheduled insulin for now. 7.Nausea may be due to Amiodarone.Consider decreasing as is maintaining SR. 8.Stop stool softeners secondary to loose stools.   Damali Broadfoot MPA-C 12/12/2011

## 2011-12-12 NOTE — Progress Notes (Signed)
    SUBJECTIVE: No chest pain or SOB. No events. Several bowel movements. Ambulating with PT and cardiac rehab today.   BP 113/71  Pulse 81  Temp(Src) 98.4 F (36.9 C) (Oral)  Resp 20  Ht 6\' 1"  (1.854 m)  Wt 345 lb 10.9 oz (156.8 kg)  BMI 45.61 kg/m2  SpO2 91%  Intake/Output Summary (Last 24 hours) at 12/12/11 1528 Last data filed at 12/12/11 1035  Gross per 24 hour  Intake    483 ml  Output   1775 ml  Net  -1292 ml    PHYSICAL EXAM General: Well developed, well nourished, in no acute distress. Alert and oriented x 3.  Psych:  Good affect, responds appropriately Neck: No JVD. No masses noted.  Lungs: Clear bilaterally with no wheezes or rhonci noted.  Heart: RRR with no murmurs noted. Abdomen: Bowel sounds are present. Soft, non-tender.  Extremities: No lower extremity edema.   LABS: Basic Metabolic Panel:  Basename 12/10/11 0510  NA 140  K 3.9  CL 95*  CO2 35*  GLUCOSE 83  BUN 16  CREATININE 0.98  CALCIUM 9.1  MG 1.9  PHOS --   CBC:  Basename 12/11/11 0556  WBC 8.8  NEUTROABS --  HGB 8.8*  HCT 28.2*  MCV 87.3  PLT 339    Current Meds:    . amiodarone  400 mg Oral BID  . antiseptic oral rinse  15 mL Mouth Rinse BID  . citalopram  20 mg Oral Daily  . colchicine  0.6 mg Oral BID  . enoxaparin  40 mg Subcutaneous Q24H  . fenofibrate  160 mg Oral Daily  . furosemide  80 mg Intravenous BID  . glimepiride  4 mg Oral BID WC  . guaiFENesin  600 mg Oral BID  . insulin aspart  0-20 Units Subcutaneous TID WC  . insulin aspart  0-5 Units Subcutaneous QHS  . metFORMIN  500 mg Oral BID  . metoprolol tartrate  12.5 mg Oral BID  . pantoprazole  40 mg Oral Q1200  . polysaccharide iron  150 mg Oral Daily  . potassium chloride  40 mEq Oral BID  . rosuvastatin  20 mg Oral q1800  . sodium chloride  3 mL Intravenous Q12H  . DISCONTD: bisacodyl  10 mg Oral Daily  . DISCONTD: bisacodyl  10 mg Rectal Daily  . DISCONTD: docusate sodium  200 mg Oral Daily  .  DISCONTD: glimepiride  4 mg Oral Q breakfast  . DISCONTD: insulin glargine  20 Units Subcutaneous BID  . DISCONTD: metFORMIN  1,000 mg Oral BID     ASSESSMENT AND PLAN:  1. Aortic stenosis: now s/p AVR with bioprosthetic valve. Doing well. Slow progress.   2. Paroxysmal atrial fibrillation- remains in NSR. He is on amiodarone and Lopressor. Agree with stopping amiodarone with his nausea and the fact that he has maintained NSR. Start ASA.   3. Chronic diastolic CHF- He still appears to be volume overloaded. Diuresing well with IV Lasix with stable renal function. His home dose of lasix is 80 mg po BID. Would continue diuresis with IV Lasix.      Tametria Aho  2/13/20133:28 PM

## 2011-12-12 NOTE — Progress Notes (Signed)
OT Cancellation Note  Treatment cancelled today due to: pt speaking with MD. Will attempt later as schedule permits. Thanks!  Glendale Chard, OTR/L Pager: [phone] 12/12/2011

## 2011-12-12 NOTE — Discharge Summary (Signed)
[address]            [name] [id number]          [phone]         Discharge Summary  Name: Antonio [name]. DOB: 1951-08-13 61 y.o. MRN: [id number]  Admission Date: 11/13/2011 Discharge Date:    Admitting Diagnosis: Shortness of breath  Discharge Diagnosis:  Severe aortic stenosis Acute on chronic combined systolic and diastolic heart failure Obstructive sleep apnea Type 2 diabetes mellitus Hypertension Hyperlipidemia Nonischemic cardiomyopathy History of gout History of hepatitis Blindness in right eye Postoperative atrial fibrillation Postoperative acute blood loss anemia Morbid obesity H/O Hepatitis   Procedures: Procedure(s): AORTIC VALVE REPLACEMENT (AVR) with 27 mm Edwards magna Ease pericardial tissue valve on 11/29/2011   HPI:  The patient is a 61 y.o. male with a history of congestive heart failure, nonischemic cardiomyopathy and known aortic stenosis. He was seen in followup by Dr. Clifton James in early January 2013 and at that time was noted to have increasing shortness of breath and lower extremity edema. He was scheduled for an echocardiogram on 11/13/2011 but was unable to lie down for the test and was noted to have a decrease in his O2 sat, to 81% on room air. He reported worsening shortness of breath over the previous 2 days, and was subsequently admitted for further evaluation and treatment.   Hospital Course:  The patient was admitted to [facility] on 11/13/2011. He was started on aggressive diuresis and underwent an echocardiogram which showed normal left ventricular ejection fraction with severe aortic stenosis. Peak gradient was 78 mm Hg with a mean gradient of 41 mm Hg. He had previously undergone cardiac catheterization approximately one year ago with normal coronary arteries. Because of his worsening heart failure symptoms, it was felt he should be considered for aortic valve replacement  at this time. A cardiac  surgery consult was obtained and the patient was seen by Dr. Donata Clay, who recommended further workup including a right heart catheterization to determine his operative status. He also was seen in consultation by pulmonary medicine to fine-tune his pulmonary status before proceeding with surgery. Right heart catheterization was performed on 11/28/2011 and the patient was noted to have slightly elevated pulmonary pressures. It was felt that since he had medically stabilized, he could proceed with traditional aortic valve replacement at this time. All risks, benefits and alternatives of surgery were explained in detail, and the patient agreed to proceed. The patient was taken to the operating room and underwent the above procedure.    The postoperative course was notable for acute blood loss anemia requiring transfusion of packed red blood cells. He has continued with aggressive medical management of his CHF. Currently, he is progressing as expected. He is diuresing well and is back down to his baseline weight with only mild lower extremity edema on physical exam. He has remained afebrile and vital signs have been stable. He did have a run of atrial fibrillation and was started on by mouth amiodarone in addition to a beta blocker. Since that time he has maintained normal sinus rhythm. His pulmonary status has remained stable and he is being treated with aggressive pulmonary toilet measures and CPAP at night for his obstructive sleep apnea. His blood sugars have remained stable on by mouth medications. He is tolerating a regular diet. He has been working with physical therapy  on the ability is making slow but steady improvement. It is felt that he'll require a brief stay in the short term skilled nursing facility for further rehabilitation before returning home. Currently he is medically stable and ready for discharge once a bed is available.   Recent vital signs:  Filed Vitals:   12/12/11 0552  BP: 134/93    Pulse: 78  Temp: 97.7 F (36.5 C)  Resp: 20    Recent laboratory studies:  CBC: Basename 12/11/11 0556  WBC 8.8  HGB 8.8*  HCT 28.2*  PLT 339   BMET:  Basename 12/10/11 0510  NA 140  K 3.9  CL 95*  CO2 35*  GLUCOSE 83  BUN 16  CREATININE 0.98  CALCIUM 9.1    PT/INR: No results found for this basename: LABPROT,INR in the last 72 hours  Discharge Medications:   Medication List  As of 12/12/2011 10:41 AM   STOP taking these medications         carvedilol 6.25 MG tablet      indomethacin 50 MG capsule      insulin glargine 100 UNIT/ML injection      lisinopril 40 MG tablet         TAKE these medications         amiodarone 400 MG tablet   Commonly known as: PACERONE   Take 400 mg po bid x 1 week, then decrease to 200 mg po bid      aspirin 81 MG tablet   Take 81 mg by mouth daily.      atorvastatin 40 MG tablet   Commonly known as: LIPITOR   Take 1 tablet (40 mg total) by mouth daily.      fenofibrate 145 MG tablet   Commonly known as: TRICOR   Take 1 tablet (145 mg total) by mouth daily.      furosemide 80 MG tablet   Commonly known as: LASIX   Take 1 tablet (80 mg total) by mouth 2 (two) times daily.      glimepiride 4 MG tablet   Commonly known as: AMARYL   Take 4 mg by mouth 2 (two) times daily.      metFORMIN 1000 MG tablet   Commonly known as: GLUCOPHAGE   Take 0.5 tablets (500 mg total) by mouth 2 (two) times daily with a meal.      metoprolol tartrate 12.5 mg Tabs   Commonly known as: LOPRESSOR   Take 0.5 tablets (12.5 mg total) by mouth 2 (two) times daily.      oxyCODONE 5 MG immediate release tablet   Commonly known as: Oxy IR/ROXICODONE   Take 1 tablet (5 mg total) by mouth every 3 (three) hours as needed for pain.      polysaccharide iron 150 MG Caps capsule   Commonly known as: NIFEREX   Take 1 capsule (150 mg total) by mouth daily.      potassium chloride SA 20 MEQ tablet   Commonly known as: K-DUR,KLOR-CON   Take 2  tablets (40 mEq total) by mouth 2 (two) times daily.           Discharge Instructions:  The patient is to refrain from driving, heavy lifting or strenuous activity.  May shower daily and clean incisions with soap and water.  May resume regular diet.  Discharge Orders    Future Appointments: Provider: Department: Dept Phone: Center:   01/02/2012 10:00 AM Mikey Bussing, MD [facility] [phone] [facility]  Follow-up Information    Follow up with MCALHANY,CHRISTOPHER, MD. Schedule an appointment as soon as possible for a visit in 2 weeks.   Contact information:   [facility] [address] [place] [address] [phone]       Follow up with Mikey Bussing, MD on 01/02/2012. (Have a chest x-ray at 9:00, then see MD at 10:00)    Contact information:   [address] [place] [address] [phone]           Adella Hare 12/12/2011, 10:13 AM patient examined and medical record reviewed,agree with above note. VAN TRIGT III,Thelia Tanksley 12/18/2011

## 2011-12-12 NOTE — Progress Notes (Signed)
Physical Therapy Treatment Patient Details Name: Antonio [name]. MRN: [id number] DOB: 1950/12/27 Today's Date: 12/12/2011  PT Assessment/Plan  PT - Assessment/Plan Comments on Treatment Session: Out of room mobility (incl stairs) not done today secondary to bowel issues. Focused on strengthening activities in room, especially aimed at improving sit to stand transfers. PT Plan: Discharge plan remains appropriate;Frequency remains appropriate PT Frequency: Min 3X/week Follow Up Recommendations: Skilled nursing facility Equipment Recommended: Defer to next venue PT Goals  Acute Rehab PT Goals PT Goal Formulation: With patient Time For Goal Achievement: 2 weeks Pt will go Supine/Side to Sit: with min assist;with HOB 0 degrees PT Goal: Supine/Side to Sit - Progress: Other (comment) (NT today) Pt will go Sit to Supine/Side: with min assist;with HOB 0 degrees PT Goal: Sit to Supine/Side - Progress: Other (comment) (NT today) Pt will go Sit to Stand: with supervision;without upper extremity assist PT Goal: Sit to Stand - Progress: Progressing toward goal Pt will go Stand to Sit: Independently PT Goal: Stand to Sit - Progress: Updated due to goals met Pt will Transfer Bed to Chair/Chair to Bed: with modified independence PT Transfer Goal: Bed to Chair/Chair to Bed - Progress: Progressing toward goal Pt will Ambulate: with modified independence;>150 feet;with least restrictive assistive device PT Goal: Ambulate - Progress: Other (comment) (progressed ambulation with cardiac rehab today) Additional Goals Additional Goal #1: Patient will have good understanding of and adhere to sternal precautions. PT Goal: Additional Goal #1 - Progress: Progressing toward goal  PT Treatment Precautions/Restrictions  Precautions Precautions: Sternal;Fall Required Braces or Orthoses: No Restrictions Weight Bearing Restrictions: No Mobility (including Balance) Bed Mobility Bed Mobility:  No Transfers Transfers: Yes Sit to Stand: 4: Min assist;From chair/3-in-1 Sit to Stand Details (indicate cue type and reason): performed multiple times as pt struggling with sit to stand with independence. Has difficulty getting wt forward onto feet.  Able to rise from [facility] easier than w/c. Stand to Sit: To chair/3-in-1;6: Modified independent (Device/Increase time) Stand to Sit Details: pt sits with safety to [facility] and to w/c Ambulation/Gait Ambulation/Gait: Yes Ambulation/Gait Assistance: 5: Supervision Ambulation/Gait Assistance Details (indicate cue type and reason): gait was minimized today due to pt having frequency of bowel mvmts.   Ambulation Distance (Feet): 5 Feet Assistive device: Rolling walker Gait Pattern: Step-through pattern;Decreased stride length;Trunk flexed Stairs: No Wheelchair Mobility Wheelchair Mobility: No  Posture/Postural Control Posture/Postural Control: No significant limitations Balance Balance Assessed: Yes Dynamic Standing Balance Dynamic Standing - Balance Support: Bilateral upper extremity supported Dynamic Standing - Level of Assistance: 6: Modified independent (Device/Increase time) Dynamic Standing - Balance Activities: Forward lean/weight shifting (partial squats) Exercise  General Exercises - Lower Extremity Ankle Circles/Pumps: AROM;Both;20 reps;Seated Gluteal Sets: Strengthening;Both;10 reps;Seated Long Arc Quad: AROM;Both;20 reps;Seated;Strengthening;Other (comment) (performed with resistance on the extension as well as flexio) Heel Slides: AROM;Strengthening;Supine (education given for in bed) Hip ABduction/ADduction: Standing;15 reps;Both;Strengthening;AROM Straight Leg Raises: AROM;Strengthening;Both;10 reps;Supine;Other (comment) (education given for in bed) Hip Flexion/Marching: AROM;Strengthening;10 reps;Both;Seated Heel Raises: AROM;Strengthening;Both;10 reps;Standing Mini-Sqauts: AROM;Strengthening;10 reps;Both;Standing Other  Exercises Other Exercises: knee extension in sitting with 1 min hold, each LE End of Session PT - End of Session Equipment Utilized During Treatment: Gait belt Activity Tolerance: Patient tolerated treatment well Patient left: in chair;with call bell in reach;with family/visitor present Nurse Communication: Mobility status for transfers General Behavior During Session: [facility] for tasks performed Cognition: [facility] for tasks performed  Jazara Swiney, [place]  [phone] 12/12/2011, 4:50 PM

## 2011-12-12 NOTE — Progress Notes (Signed)
CARDIAC REHAB PHASE I   PRE:  Rate/Rhythm: 88SR  BP:  Supine:   Sitting: 120/60  Standing:    SaO2: 90%RA  MODE:  Ambulation: 460 ft   POST:  Rate/Rhythem: 97  BP:  Supine:   Sitting:   Standing:  BSC   SaO2: 93%RA 0850-0920 Had difficulty getting pt to standing position. Took several attempts. Encouraged pt to rock but he had difficulty straightening knees. Walked 460 ft with rolling walker and asst x 1. One asst to follow with pt's wheelchair. Pt had to sit at 230 ft to rest. To BSC  After walk. Notified staff.  [name]

## 2011-12-13 DIAGNOSIS — E1165 Type 2 diabetes mellitus with hyperglycemia: Secondary | ICD-10-CM

## 2011-12-13 DIAGNOSIS — IMO0001 Reserved for inherently not codable concepts without codable children: Secondary | ICD-10-CM

## 2011-12-13 LAB — GLUCOSE, CAPILLARY
Glucose-Capillary: 115 mg/dL — ABNORMAL HIGH (ref 70–99)
Glucose-Capillary: 165 mg/dL — ABNORMAL HIGH (ref 70–99)
Glucose-Capillary: 138 mg/dL — ABNORMAL HIGH (ref 70–99)
Glucose-Capillary: 155 mg/dL — ABNORMAL HIGH (ref 70–99)

## 2011-12-13 MED ORDER — AMIODARONE HCL 200 MG PO TABS
200.0000 mg | ORAL_TABLET | Freq: Two times a day (BID) | ORAL | Status: DC
  Administered 2011-12-13 – 2011-12-17 (×9): 200 mg via ORAL
  Filled 2011-12-13 (×10): qty 1

## 2011-12-13 NOTE — Progress Notes (Signed)
Attempted to ambulate patient but he was not able to stand from the wheelchair after 4 attempts. Nt and I attempted to support and encourage patient but he said his legs "feel like jelly". Patient then declined ambulation and stated will try again latter this afternoon. 

## 2011-12-13 NOTE — Progress Notes (Addendum)
14 Days Post-Op Procedure(s) (LRB): AORTIC VALVE REPLACEMENT (AVR) (N/A)  Subjective:   Objective: Vital signs in last 24 hours: Patient Vitals for the past 24 hrs:  BP Temp Temp src Pulse Resp SpO2 Weight  12/13/11 0831 116/73 mmHg - Oral 82  20  93 % -  12/13/11 0742 - - - - - - 344 lb 9.3 oz (156.3 kg)  12/13/11 0701 140/72 mmHg 97.6 F (36.4 C) Oral 74  18  89 % -  12/12/11 2142 107/66 mmHg 98 F (36.7 C) Oral 84  20  91 % -  12/12/11 1443 113/71 mmHg 98.4 F (36.9 C) Oral 81  20  91 % -  12/12/11 1035 133/67 mmHg - - 82  - - -   Pre op weight  158.3 kg Current Weight  12/13/11 344 lb 9.3 oz (156.3 kg)      Intake/Output from previous day: 02/13 0701 - 02/14 0700 In: 3 [I.V.:3] Out: 1625 [Urine:1625]   Physical Exam:  Cardiovascular: RRR, no murmurs, gallops, or rubs. Pulmonary: Clear to auscultation bilaterally; no rales, wheezes, or rhonchi. Abdomen: Soft, non tender, bowel sounds present. Extremities: Mild bilateral lower extremity edema. Wounds: Clean and dry.  No erythema or signs of infection.  Lab Results: CBC:  Basename 12/11/11 0556  WBC 8.8  HGB 8.8*  HCT 28.2*  PLT 339   BMET: No results found for this basename: NA:2,K:2,CL:2,CO2:2,GLUCOSE:2,BUN:2,CREATININE:2,CALCIUM:2 in the last 72 hours  PT/INR: No results found for this basename: LABPROT,INR in the last 72 hours ABG:  INR: Will add last result for INR, ABG once components are confirmed Will add last 4 CBG results once components are confirmed  Assessment/Plan:  1. CV - Previous PAF.Maintaining SR. On Amiodarone 400 bid and Lopressor 12.5 bid. 2.  Pulmonary - Encourage incentive spirometer. 3. Volume Overload - On Lasix 80 iv bid. Consider changing to po soon. 4.  Acute blood loss anemia - Last H/H 8.8/28.2.Continue Ni ferex. 5.DM- CBGs 129/132/115 .Pre op HGA1C 8.5.  Continue Amaryl and Metformin. 6.Nausea may be due to Amiodarone. Will decrease as is maintaining SR. 7.Loose stools  most of yesterday. Patient states when he first takes Metformin, this is common. Monitor. 8. Proable SNF on Monday.   ZIMMERMAN,DONIELLE MPA-C 12/13/2011   patient examined and medical record reviewed,agree with above note. VAN TRIGT III,Ula Couvillon 12/13/2011

## 2011-12-13 NOTE — Progress Notes (Signed)
Occupational Therapy Treatment Patient Details Name: Antonio [name]. MRN: [id number] DOB: [birth year] Today's Date: 12/13/2011  OT Assessment/Plan OT Assessment/Plan OT Plan: Discharge plan remains appropriate Follow Up Recommendations: Skilled nursing facility OT Goals ADL Goals ADL Goal: Lower Body Dressing - Progress: Progressing toward goals ADL Goal: Toilet Transfer - Progress: Progressing toward goals ADL Goal: Toileting - Clothing Manipulation - Progress: Progressing toward goals ADL Goal: Toileting - Hygiene - Progress: Progressing toward goals Miscellaneous OT Goals OT Goal: Miscellaneous Goal #1 - Progress: Progressing toward goals  OT Treatment Precautions/Restrictions  Precautions Precautions: Sternal;Fall Restrictions Weight Bearing Restrictions: No   ADL ADL Lower Body Dressing: Simulated;Minimal assistance Lower Body Dressing Details (indicate cue type and reason): educated on adaptive techniques for donning pants Where Assessed - Lower Body Dressing: Sitting, chair Toilet Transfer: Performed;Supervision/safety Toilet Transfer Details (indicate cue type and reason): RW ambulation to bedside commode. However, easily fatigued Toilet Transfer Method: Proofreader: Set designer - Clothing Manipulation: Performed;Supervision/safety Toileting - Clothing Manipulation Details (indicate cue type and reason): with gown Where Assessed - Toileting Clothing Manipulation: Standing Tub/Shower Transfer: Not assessed Ambulation Related to ADLs: Supervision/Min guard A with RW ambulation ADL Comments: Pt fatigued from earlier activity and therefore limited during session. Encouraged pt to perfomed as much of sit to stand transfer on his own (encouraged maximal muscle recruitment) in order to assist with strengthening key muscle groups  Mobility  Transfers Sit to Stand: 1: +2 Total assist;From chair/3-in-1 (pt=80% sit to stand from chair.  required +2 due to fatigue) Sit to Stand Details (indicate cue type and reason): educated on use of rocking to initiate sit to stand  Stand to Sit: 5: Supervision;To chair/3-in-1 Stand to Sit Details: VC for hand placement to maintain sternal precautions  End of Session OT - End of Session Equipment Utilized During Treatment: Gait belt Activity Tolerance: Patient limited by fatigue;Patient tolerated treatment well Patient left: in chair;with call bell in reach;with family/visitor present General Behavior During Session: [facility] [place] for tasks performed Cognition: [facility] for tasks performed  Nyzir Dubois  12/13/2011, 3:25 PM

## 2011-12-13 NOTE — Progress Notes (Signed)
UR Completed.  Clary Boulais Jane [phone] 12/13/2011  

## 2011-12-13 NOTE — Progress Notes (Signed)
Pt ambulated 250 ft - got in wheel chair and wheeled back to room with complaints of back pain.  Pain med given per order paremeters.  Pt left in wheelchair per request.  Will cont plan of care.

## 2011-12-13 NOTE — Progress Notes (Signed)
CARDIAC REHAB PHASE I   PRE:  Rate/Rhythm: 82SR  BP:  Supine:   Sitting: 96/64  Standing:    SaO2: 92%RA  MODE:  Ambulation: 510 ft   POST:  Rate/Rhythem:  BSC  BP:  Supine:  Sitting:   Standing:    SaO2:  1012-1040 Pt walked 510 ft with rolling walker and asst x1 on RA. One person to follow with his wheelchair. Pt had to sit once to rest. Tolerated well. Doing better today with standing. Still needed asst to stand from wheelchair but not from the bed. To BSC after walk so vitals not done.   [name]

## 2011-12-13 NOTE — Progress Notes (Signed)
    SUBJECTIVE: No complaints this am. One episode of diarrhea this am.   BP 140/72  Pulse 74  Temp(Src) 97.6 F (36.4 C) (Oral)  Resp 18  Ht 6\' 1"  (1.854 m)  Wt 344 lb 9.3 oz (156.3 kg)  BMI 45.46 kg/m2  SpO2 89%  Intake/Output Summary (Last 24 hours) at 12/13/11 0820 Last data filed at 12/13/11 0700  Gross per 24 hour  Intake      3 ml  Output   1625 ml  Net  -1622 ml    PHYSICAL EXAM General: Well developed, well nourished, in no acute distress. Alert and oriented x 3.  Psych:  Good affect, responds appropriately Neck: No JVD. No masses noted.  Lungs: Clear bilaterally with no wheezes or rhonci noted.  Heart: RRR with no murmurs noted. Abdomen: Bowel sounds are present. Soft, non-tender.  Extremities: 2+ bilateral  lower extremity edema.   LABS: CBC:  Basename 12/11/11 0556  WBC 8.8  NEUTROABS --  HGB 8.8*  HCT 28.2*  MCV 87.3  PLT 339    Current Meds:    . amiodarone  400 mg Oral BID  . antiseptic oral rinse  15 mL Mouth Rinse BID  . citalopram  20 mg Oral Daily  . colchicine  0.6 mg Oral BID  . enoxaparin  40 mg Subcutaneous Q24H  . fenofibrate  160 mg Oral Daily  . furosemide  80 mg Intravenous BID  . glimepiride  4 mg Oral BID WC  . guaiFENesin  600 mg Oral BID  . insulin aspart  0-20 Units Subcutaneous TID WC  . insulin aspart  0-5 Units Subcutaneous QHS  . metFORMIN  500 mg Oral BID  . metoprolol tartrate  12.5 mg Oral BID  . pantoprazole  40 mg Oral Q1200  . polysaccharide iron  150 mg Oral Daily  . potassium chloride  40 mEq Oral BID  . rosuvastatin  20 mg Oral q1800  . sodium chloride  3 mL Intravenous Q12H  . DISCONTD: bisacodyl  10 mg Oral Daily  . DISCONTD: bisacodyl  10 mg Rectal Daily  . DISCONTD: docusate sodium  200 mg Oral Daily  . DISCONTD: glimepiride  4 mg Oral Q breakfast  . DISCONTD: insulin glargine  20 Units Subcutaneous BID  . DISCONTD: metFORMIN  1,000 mg Oral BID     ASSESSMENT AND PLAN:   1. Aortic stenosis: now  s/p AVR with bioprosthetic valve. Doing well. Slow progress.   2. Paroxysmal atrial fibrillation- remains in NSR. He is on amiodarone and Lopressor. Agree with stopping amiodarone with his nausea and the fact that he has maintained NSR. Start ASA.   3. Chronic diastolic CHF- He still appears to be volume overloaded. Diuresing well with IV Lasix with stable renal function. His home dose of lasix is 80 mg po BID. Would continue diuresis with IV Lasix. He will probably need 2-3 more days of diuresis.    Joellen Tullos  2/14/20138:20 AM

## 2011-12-13 NOTE — Progress Notes (Signed)
Nutrition Follow-up  Diet Order:  Carb Mod with snacks PO intake, 100%  Patient was scheduled for d/c on 2/6, still needs to be diuresed so will be staying for a few more days.  Meds: Scheduled Meds:   . amiodarone  400 mg Oral BID  . antiseptic oral rinse  15 mL Mouth Rinse BID  . citalopram  20 mg Oral Daily  . colchicine  0.6 mg Oral BID  . enoxaparin  40 mg Subcutaneous Q24H  . fenofibrate  160 mg Oral Daily  . furosemide  80 mg Intravenous BID  . glimepiride  4 mg Oral BID WC  . guaiFENesin  600 mg Oral BID  . insulin aspart  0-20 Units Subcutaneous TID WC  . insulin aspart  0-5 Units Subcutaneous QHS  . metFORMIN  500 mg Oral BID  . metoprolol tartrate  12.5 mg Oral BID  . pantoprazole  40 mg Oral Q1200  . polysaccharide iron  150 mg Oral Daily  . potassium chloride  40 mEq Oral BID  . rosuvastatin  20 mg Oral q1800  . sodium chloride  3 mL Intravenous Q12H   Continuous Infusions:   . sodium chloride Stopped (12/05/11 2200)   PRN Meds:.sodium chloride, HYDROcodone-acetaminophen, loperamide, metoprolol, ondansetron (ZOFRAN) IV, oxyCODONE, sodium chloride, sodium chloride, traMADol  Labs:  CMP     Component Value Date/Time   NA 140 12/10/2011 0510   K 3.9 12/10/2011 0510   CL 95* 12/10/2011 0510   CO2 35* 12/10/2011 0510   GLUCOSE 83 12/10/2011 0510   BUN 16 12/10/2011 0510   CREATININE 0.98 12/10/2011 0510   CALCIUM 9.1 12/10/2011 0510   PROT 7.0 11/27/2011 0520   ALBUMIN 3.0* 11/27/2011 0520   AST 18 11/27/2011 0520   ALT 24 11/27/2011 0520   ALKPHOS 69 11/27/2011 0520   BILITOT 0.2* 11/27/2011 0520   GFRNONAA 87* 12/10/2011 0510   GFRAA >90 12/10/2011 0510     Intake/Output Summary (Last 24 hours) at 12/13/11 0907 Last data filed at 12/13/11 0825  Gross per 24 hour  Intake     23 ml  Output   1625 ml  Net  -1602 ml    Weight Status:  344 lbs, weight continues to trend down with negative fluid balance  Re-estimated needs:  1900-2000 kcal and 110-120 gm  potein  Nutrition Dx:  Inadequate oral intake, resolved. Patient is meeting needs with PO intake of meals and snacks  Goal:  PO diet to meet >90% of estimated nutrition needs, met  Intervention:   No nutrition interventions at this time  Monitor:  PO intake, weight, labs, I/O's   Rudean Haskell Pager #:  [phone]

## 2011-12-13 NOTE — Progress Notes (Signed)
Placed patient on autocpap with patients home mask and 3 liters of oxygen. Patient is tolerating well.

## 2011-12-13 NOTE — Plan of Care (Deleted)
Attempted to ambulate patient but he was not able to stand from the wheelchair after 4 attempts. Nt and I attempted to support and encourage patient but he said his legs "feel like jelly". Patient then declined ambulation and stated will try again latter this afternoon.

## 2011-12-13 NOTE — Progress Notes (Signed)
CSW working to [employer] authorization for SNF. [employer] has not approved as of present time. Pt very anxious about this situation, and expresses strong desire for SNF at d/c. Dr [name] working to [employer]. CSW will continue to follow.  Baxter Flattery, MSW [phone]

## 2011-12-14 DIAGNOSIS — E1165 Type 2 diabetes mellitus with hyperglycemia: Secondary | ICD-10-CM

## 2011-12-14 DIAGNOSIS — IMO0001 Reserved for inherently not codable concepts without codable children: Secondary | ICD-10-CM

## 2011-12-14 LAB — GLUCOSE, CAPILLARY
Glucose-Capillary: 101 mg/dL — ABNORMAL HIGH (ref 70–99)
Glucose-Capillary: 154 mg/dL — ABNORMAL HIGH (ref 70–99)
Glucose-Capillary: 123 mg/dL — ABNORMAL HIGH (ref 70–99)
Glucose-Capillary: 96 mg/dL (ref 70–99)

## 2011-12-14 LAB — BASIC METABOLIC PANEL
BUN: 24 mg/dL — ABNORMAL HIGH (ref 6–23)
Creatinine, Ser: 1.31 mg/dL (ref 0.50–1.35)
GFR calc Af Amer: 66 mL/min — ABNORMAL LOW (ref >90)
GFR calc non Af Amer: 57 mL/min — ABNORMAL LOW (ref >90)

## 2011-12-14 MED ORDER — AMIODARONE HCL 200 MG PO TABS: 200.0000 mg | ORAL_TABLET | Freq: Two times a day (BID) | ORAL | Status: DC

## 2011-12-14 MED ORDER — DOCUSATE SODIUM 100 MG PO CAPS
100.0000 mg | ORAL_CAPSULE | Freq: Every day | ORAL | Status: DC
  Administered 2011-12-14 – 2011-12-17 (×4): 100 mg via ORAL
  Filled 2011-12-14 (×4): qty 1

## 2011-12-14 MED ORDER — METFORMIN HCL 1000 MG PO TABS: 1000.0000 mg | ORAL_TABLET | Freq: Two times a day (BID) | ORAL | Status: DC

## 2011-12-14 MED ORDER — CITALOPRAM HYDROBROMIDE 20 MG PO TABS: 20.0000 mg | ORAL_TABLET | Freq: Every day | ORAL | Status: DC

## 2011-12-14 MED ORDER — DOCUSATE SODIUM 100 MG PO CAPS: 200.0000 mg | ORAL_CAPSULE | Freq: Every day | ORAL | Status: DC

## 2011-12-14 NOTE — Progress Notes (Signed)
Physical Therapy Treatment Patient Details Name: Antonio [name]. MRN: [id number] DOB: 1951/04/02 Today's Date: 12/14/2011  PT Assessment/Plan  PT - Assessment/Plan Comments on Treatment Session: Pt s/p AVR with continued improvement in transfers and gait. Sit to stand improved with elevated surface and continued practice during tx. Pt approved for SNF and does not require stairs at this time. Pt stated all sternal precautions independently. PT Plan: Discharge plan remains appropriate;Frequency remains appropriate PT Goals  Acute Rehab PT Goals PT Goal: Sit to Stand - Progress: Progressing toward goal PT Goal: Stand to Sit - Progress: Progressing toward goal PT Transfer Goal: Bed to Chair/Chair to Bed - Progress: Progressing toward goal  PT Treatment Precautions/Restrictions  Precautions Precautions: Fall;Sternal Required Braces or Orthoses: No Restrictions Weight Bearing Restrictions: No Mobility (including Balance) Bed Mobility Bed Mobility: No Transfers Sit to Stand: From chair/3-in-1;5: Supervision;4: Min assist Sit to Stand Details (indicate cue type and reason): pt stood x 3 from [facility] and x 60from [facility] for practice with transfer. Pt maintains hands on thighs and requires cueing and assist from [facility] but none from [facility] Stand to Sit: To chair/3-in-1 Stand to Sit Details: multiple trials to WC and BSC with good hand placement and control  Ambulation/Gait Ambulation/Gait Assistance: 5: Supervision Ambulation/Gait Assistance Details (indicate cue type and reason): followed with WC Ambulation Distance (Feet): 250 Feet (250 x 2 with seated rest 3 min between trials) Assistive device: Rolling walker Gait Pattern: Step-through pattern;Decreased stride length;Trunk flexed Gait velocity: slowly Stairs: No  Posture/Postural Control Posture/Postural Control: No significant limitations Exercise    End of Session PT - End of Session Activity Tolerance: Patient tolerated treatment  well Patient left: in chair;with call bell in reach Nurse Communication: Mobility status for transfers;Mobility status for ambulation General Behavior During Session: [facility] for tasks performed Cognition: [place] for tasks performed  Delorse Lek 12/14/2011, 3:44 PM Toney Sang, PT [phone]

## 2011-12-14 NOTE — Progress Notes (Signed)
CARDIAC REHAB PHASE I   PRE:  Rate/Rhythm: 85SR  BP:  Supine:   Sitting: 100/60  Standing:    SaO2: 91%RA  MODE:  Ambulation: 550 ft   POST:  Rate/Rhythem: 93  BP:  Supine:   Sitting:   Standing: BSC   SaO2: 88-91%RA 1015-1045 Pt walked 550 ft on RA with rolling walker and asst x 1. One asst to follow with wheelchair. Pt had to sit once to rest due to orthopedic issues. Tolerated increase in distance well. To BSC after walk. Sats at 88%RA right after walk but quickly to 91%.  Antonio [name]

## 2011-12-14 NOTE — Progress Notes (Signed)
15 Days Post-Op Procedure(s) (LRB): AORTIC VALVE REPLACEMENT (AVR) (N/A)  Subjective: Feeling fairly well. No more loose stools.  Objective: Vital signs in last 24 hours: Patient Vitals for the past 24 hrs:  BP Temp Temp src Pulse Resp SpO2 Weight  12/14/11 0645 122/68 mmHg 98.1 F (36.7 C) Oral 72  18  92 % 343 lb 14.7 oz (156 kg)  12/13/11 1405 124/79 mmHg 98.7 F (37.1 C) Oral 78  18  93 % -  12/13/11 1043 122/83 mmHg - - 90  - 91 % -   Pre op weight  158.3 kg Current Weight  12/14/11 343 lb 14.7 oz (156 kg)      Intake/Output from previous day: 02/14 0701 - 02/15 0700 In: 1340 [P.O.:1320; I.V.:20] Out: 1951 [Urine:1950; Stool:1]   Physical Exam:  Cardiovascular: RRR, no murmurs, gallops, or rubs. Pulmonary: Clear to auscultation bilaterally; no rales, wheezes, or rhonchi. Abdomen: Soft, non tender, bowel sounds present. Extremities: Mild bilateral lower extremity edema (has decreased immensely over the last several days) Wounds: Clean and dry.  No erythema or signs of infection.  Lab Results: CBC: No results found for this basename: WBC:2,HGB:2,HCT:2,PLT:2 in the last 72 hours BMET:   Basename 12/14/11 0630  NA 140  K 3.8  CL 95*  CO2 33*  GLUCOSE 97  BUN 24*  CREATININE 1.31  CALCIUM 9.2    PT/INR: No results found for this basename: LABPROT,INR in the last 72 hours ABG:  INR: Will add last result for INR, ABG once components are confirmed Will add last 4 CBG results once components are confirmed  Assessment/Plan:  1. CV - Previous PAF.Maintaining SR. Continue Amiodarone 400 bid and Lopressor 12.5 bid. 2.  Pulmonary - Encourage incentive spirometer. 3. Volume Overload - On Lasix 80 iv bid.Will likely resume 80 po bid upon discharge. 4.  Acute blood loss anemia - Last H/H 8.8/28.2.Continue Ni ferex. 5.DM- CBGs 138/155/101 .Pre op HGA1C 8.5.  Continue Amaryl and Metformin. 6.Nausea may be due to Amiodarone. Will decrease as is maintaining SR. 7.  Proable SNF on Monday.   Brittnei Jagiello MPA-C 12/14/2011

## 2011-12-14 NOTE — Progress Notes (Signed)
    SUBJECTIVE: No complaints this am.   BP 122/68  Pulse 72  Temp(Src) 98.1 F (36.7 C) (Oral)  Resp 18  Ht 6\' 1"  (1.854 m)  Wt 343 lb 14.7 oz (156 kg)  BMI 45.37 kg/m2  SpO2 92%  Intake/Output Summary (Last 24 hours) at 12/14/11 0826 Last data filed at 12/14/11 4540  Gross per 24 hour  Intake    960 ml  Output   1951 ml  Net   -991 ml    PHYSICAL EXAM General: Well developed, well nourished, in no acute distress. Alert and oriented x 3.  Psych:  Good affect, responds appropriately Neck: No JVD. No masses noted.  Lungs: Clear bilaterally with no wheezes or rhonci noted.  Heart: RRR with no murmurs noted. Abdomen: Bowel sounds are present. Soft, non-tender.  Extremities: Bilateral 1+ lower extremity edema.   LABS: Basic Metabolic Panel:  Basename 12/14/11 0630  NA 140  K 3.8  CL 95*  CO2 33*  GLUCOSE 97  BUN 24*  CREATININE 1.31  CALCIUM 9.2  MG --  PHOS --     Current Meds:    . amiodarone  200 mg Oral BID  . antiseptic oral rinse  15 mL Mouth Rinse BID  . citalopram  20 mg Oral Daily  . colchicine  0.6 mg Oral BID  . enoxaparin  40 mg Subcutaneous Q24H  . fenofibrate  160 mg Oral Daily  . furosemide  80 mg Intravenous BID  . glimepiride  4 mg Oral BID WC  . guaiFENesin  600 mg Oral BID  . insulin aspart  0-20 Units Subcutaneous TID WC  . insulin aspart  0-5 Units Subcutaneous QHS  . metFORMIN  500 mg Oral BID  . metoprolol tartrate  12.5 mg Oral BID  . pantoprazole  40 mg Oral Q1200  . polysaccharide iron  150 mg Oral Daily  . potassium chloride  40 mEq Oral BID  . rosuvastatin  20 mg Oral q1800  . sodium chloride  3 mL Intravenous Q12H  . DISCONTD: amiodarone  400 mg Oral BID     ASSESSMENT AND PLAN:  1. Aortic stenosis: now s/p AVR with bioprosthetic valve. Doing well. Slow progress.   2. Paroxysmal atrial fibrillation- remains in NSR. He is on amiodarone and Lopressor. Agree with stopping amiodarone with his nausea and the fact that he  has maintained NSR. Start ASA.   3. Chronic diastolic CHF- He still appears to be volume overloaded. Diuresing well with IV Lasix with stable renal function. Would continue IV Lasix today and reassess in am. His home dose of lasix is 80 mg po BID.      MCALHANY,CHRISTOPHER  2/15/20138:26 AM

## 2011-12-14 NOTE — Discharge Summary (Addendum)
Addendum Note to D/C Antonio [name]. is a 61 y.o. male who is S/P Procedure(s): AORTIC VALVE REPLACEMENT (AVR). The patient is stable and ready for discharge. Changes to his medications include:  Citalopram (Celexa) 20 mg po daily. Amiodarone 200 mg po two times daily. Lasix 120 mg po two times daily.    He was also on Insulin Glargine 60 units SQ at bedtime. He should be started on low dose Insulin Glargine as glucose increases and it should be titrated over time to the 60 units at bedtime. He needs to call for a follow up appointment with his medical doctor for further Surveillance of HGA1C (pre op 8.5) and further diabetes management. Also, a BMET needs to be drawn by the SNF on Thursday 12/20/2011 and results faxed to Dr. Weldon Picking office.  No changes to History or Physical Exam.  Ardelle Balls PA-C 12/14/2011 2:14 PM   patient examined and medical record reviewed,agree with above note. VAN TRIGT III,Antonio [name] 12/18/2011

## 2011-12-15 LAB — GLUCOSE, CAPILLARY
Glucose-Capillary: 106 mg/dL — ABNORMAL HIGH (ref 70–99)
Glucose-Capillary: 172 mg/dL — ABNORMAL HIGH (ref 70–99)
Glucose-Capillary: 123 mg/dL — ABNORMAL HIGH (ref 70–99)

## 2011-12-15 NOTE — Progress Notes (Signed)
CARDIAC REHAB PHASE I   PRE:  Rate/Rhythm: 69  BP:  Supine:   Sitting: 126/70  Standing:    SaO2: 93%RA  MODE:  Ambulation: 550 ft   POST:  Rate/Rhythem: 89  BP:  Supine:   Sitting: 157/91  Standing:    SaO2: 93%  1516 - 1555 Ambulated with RW and followed with chair, rest break x 1. Tolerated well. Back to [facility] with call in reach.    Rosalie Doctor

## 2011-12-15 NOTE — Progress Notes (Signed)
Subjective:  Maintaining NSR. No chest pain. Stamina gradually improving.Nausea slightly better.  Objective:  Vital Signs in the last 24 hours: Temp:  [98.2 F (36.8 C)-98.3 F (36.8 C)] 98.2 F (36.8 C) (02/15 2148) Pulse Rate:  [68-97] 68  (02/15 2148) Resp:  [18] 18  (02/15 2148) BP: (132-134)/(67-82) 134/67 mmHg (02/15 2148) SpO2:  [90 %-91 %] 91 % (02/15 2148)  Intake/Output from previous day: 02/15 0701 - 02/16 0700 In: 1080 [P.O.:1080] Out: 500 [Urine:500] Intake/Output from this shift: Total I/O In: 240 [P.O.:240] Out: -      . amiodarone  200 mg Oral BID  . antiseptic oral rinse  15 mL Mouth Rinse BID  . citalopram  20 mg Oral Daily  . colchicine  0.6 mg Oral BID  . docusate sodium  100 mg Oral Daily  . enoxaparin  40 mg Subcutaneous Q24H  . fenofibrate  160 mg Oral Daily  . furosemide  80 mg Intravenous BID  . glimepiride  4 mg Oral BID WC  . guaiFENesin  600 mg Oral BID  . insulin aspart  0-20 Units Subcutaneous TID WC  . insulin aspart  0-5 Units Subcutaneous QHS  . metFORMIN  500 mg Oral BID  . metoprolol tartrate  12.5 mg Oral BID  . pantoprazole  40 mg Oral Q1200  . polysaccharide iron  150 mg Oral Daily  . potassium chloride  40 mEq Oral BID  . rosuvastatin  20 mg Oral q1800  . sodium chloride  3 mL Intravenous Q12H      . sodium chloride Stopped (12/05/11 2200)    Physical Exam: The patient appears to be in no distress.  Head and neck exam reveals that the pupils are equal and reactive.  The extraocular movements are full.  There is no scleral icterus.  Mouth and pharynx are benign.  No lymphadenopathy.  No carotid bruits.  The jugular venous pressure is normal.  Thyroid is not enlarged or tender.  Chest is clear to percussion and auscultation. Decreased breath sounds at bases.  Heart reveals no abnormal lift or heave.  First and second heart sounds are normal.  There is no murmur gallop rub or click.  The abdomen is soft and nontender.   Bowel sounds are normoactive.  There is no hepatosplenomegaly or mass.  There are no abdominal bruits.  Extremities reveal mild pretibial edema.  Pedal pulses are good.  There is no cyanosis or clubbing.  Neurologic exam is normal strength and no lateralizing weakness.  No sensory deficits.  Integument reveals no rash  Lab Results: No results found for this basename: WBC:2,HGB:2,PLT:2 in the last 72 hours  Basename 12/14/11 0630  NA 140  K 3.8  CL 95*  CO2 33*  GLUCOSE 97  BUN 24*  CREATININE 1.31   No results found for this basename: TROPONINI:2,CK,MB:2 in the last 72 hours Hepatic Function Panel No results found for this basename: PROT,ALBUMIN,AST,ALT,ALKPHOS,BILITOT,BILIDIR,IBILI in the last 72 hours No results found for this basename: CHOL in the last 72 hours No results found for this basename: PROTIME in the last 72 hours  Imaging: Imaging results have been reviewed.  No recent xray.  Cardiac Studies:  Assessment/Plan:  Patient Active Hospital Problem List: Systolic and diastolic CHF, acute on chronic (11/20/2011)   Assessment: Only lost 1 lb overnight.   Plan continue IV lasix today, consider switch to oral in am    LOS: 32 days    Cassell Clement 12/15/2011, 10:47 AM

## 2011-12-15 NOTE — Progress Notes (Addendum)
                    [address]            [place] [address]          [phone]     16 Days Post-Op Procedure(s) (LRB): AORTIC VALVE REPLACEMENT (AVR) (N/A)  Subjective: Stable, no complaints.  Objective: Vital signs in last 24 hours: Patient Vitals for the past 24 hrs:  BP Temp Temp src Pulse Resp SpO2  12/14/11 2148 134/67 mmHg 98.2 F (36.8 C) Oral 68  18  91 %  12/14/11 1520 - - - 97  - 90 %  12/14/11 1510 - - - 95  - 90 %  12/14/11 1351 132/82 mmHg 98.3 F (36.8 C) Oral 77  18  90 %   Current Weight  12/14/11 156 kg (343 lb 14.7 oz)   Pre op weight 158.3 kg   Intake/Output from previous day: 02/15 0701 - 02/16 0700 In: 1080 [P.O.:1080] Out: 500 [Urine:500]  CBGs [phone]  PHYSICAL EXAM:  Heart: RRR Lungs: clear Wound: clean and dry Extremities: + LE edema  Lab Results: CBC:No results found for this basename: WBC:2,HGB:2,HCT:2,PLT:2 in the last 72 hours BMET:  Basename 12/14/11 0630  NA 140  K 3.8  CL 95*  CO2 33*  GLUCOSE 97  BUN 24*  CREATININE 1.31  CALCIUM 9.2    PT/INR: No results found for this basename: LABPROT,INR in the last 72 hours   Assessment/Plan: S/P Procedure(s) (LRB): AORTIC VALVE REPLACEMENT (AVR) (N/A) CV- Maintaining SR.  Continue Amio, Lopressor. Vol overload/CHF- continue diuresis. DM- sugars stable. To SNF on Monday.   LOS: 32 days    COLLINS,GINA H 12/15/2011   I have seen and examined the patient and agree with the assessment and plan as outlined.  Srihitha Tagliaferri H 12/15/2011 12:45 PM

## 2011-12-16 LAB — GLUCOSE, CAPILLARY
Glucose-Capillary: 122 mg/dL — ABNORMAL HIGH (ref 70–99)
Glucose-Capillary: 165 mg/dL — ABNORMAL HIGH (ref 70–99)
Glucose-Capillary: 84 mg/dL (ref 70–99)
Glucose-Capillary: 130 mg/dL — ABNORMAL HIGH (ref 70–99)

## 2011-12-16 MED ORDER — FUROSEMIDE 80 MG PO TABS
120.0000 mg | ORAL_TABLET | Freq: Two times a day (BID) | ORAL | Status: DC
  Administered 2011-12-16 – 2011-12-17 (×3): 120 mg via ORAL
  Filled 2011-12-16 (×6): qty 1

## 2011-12-16 NOTE — Progress Notes (Addendum)
                    [address]            ,[place] [address]          [phone]     17 Days Post-Op Procedure(s) (LRB): AORTIC VALVE REPLACEMENT (AVR) (N/A)  Subjective: Comfortable, no complaints.  Objective: Vital signs in last 24 hours: Patient Vitals for the past 24 hrs:  BP Temp Temp src Pulse Resp SpO2 Weight  12/16/11 0500 149/86 mmHg 96.8 F (36 C) Oral 86  20  90 % 155.039 kg (341 lb 12.8 oz)  12/15/11 2045 122/72 mmHg 97.8 F (36.6 C) Oral 78  20  90 % -  12/15/11 1450 105/66 mmHg 97.7 F (36.5 C) Oral 75  20  92 % -   Current Weight  12/16/11 155.039 kg (341 lb 12.8 oz)   Pre op weight 158.3 kg   Intake/Output from previous day: 02/16 0701 - 02/17 0700 In: 840 [P.O.:840] Out: 1181 [Urine:1180; Emesis/NG output:1]  CBGs [phone]  PHYSICAL EXAM:  Heart: RRR Lungs: clear Wound: clean and dry Extremities: Stable, slightly improved LE edema  Lab Results: CBC:No results found for this basename: WBC:2,HGB:2,HCT:2,PLT:2 in the last 72 hours BMET:  Basename 12/14/11 0630  NA 140  K 3.8  CL 95*  CO2 33*  GLUCOSE 97  BUN 24*  CREATININE 1.31  CALCIUM 9.2    PT/INR: No results found for this basename: LABPROT,INR in the last 72 hours   Assessment/Plan: S/P Procedure(s) (LRB): AORTIC VALVE REPLACEMENT (AVR) (N/A) CV- SR, BPs stable Vol overload- continue diuresis. DM- CBGs stable on current meds. Disp- SNF in am   LOS: 33 days    COLLINS,GINA H 12/16/2011   I have seen and examined the patient and agree with the assessment and plan as outlined.  Peniel Hass H 12/16/2011 1:28 PM

## 2011-12-16 NOTE — Progress Notes (Signed)
Subjective:  Maintaining NSR. No chest pain. Stamina gradually improving.Nausea slightly better. Looking forward to transfer to [facility] in am.  Objective:  Vital Signs in the last 24 hours: Temp:  [96.8 F (36 C)-97.8 F (36.6 C)] 96.8 F (36 C) (02/17 0500) Pulse Rate:  [75-86] 86  (02/17 0500) Resp:  [20] 20  (02/17 0500) BP: (105-149)/(66-86) 149/86 mmHg (02/17 0500) SpO2:  [90 %-92 %] 90 % (02/17 0500) Weight:  [341 lb 12.8 oz (155.039 kg)] 341 lb 12.8 oz (155.039 kg) (02/17 0500)  Intake/Output from previous day: 02/16 0701 - 02/17 0700 In: 840 [P.O.:840] Out: 1181 [Urine:1180; Emesis/NG output:1] Intake/Output from this shift: Total I/O In: 240 [P.O.:240] Out: -      . amiodarone  200 mg Oral BID  . antiseptic oral rinse  15 mL Mouth Rinse BID  . citalopram  20 mg Oral Daily  . colchicine  0.6 mg Oral BID  . docusate sodium  100 mg Oral Daily  . enoxaparin  40 mg Subcutaneous Q24H  . fenofibrate  160 mg Oral Daily  . furosemide  80 mg Intravenous BID  . glimepiride  4 mg Oral BID WC  . guaiFENesin  600 mg Oral BID  . insulin aspart  0-20 Units Subcutaneous TID WC  . insulin aspart  0-5 Units Subcutaneous QHS  . metFORMIN  500 mg Oral BID  . metoprolol tartrate  12.5 mg Oral BID  . pantoprazole  40 mg Oral Q1200  . polysaccharide iron  150 mg Oral Daily  . potassium chloride  40 mEq Oral BID  . rosuvastatin  20 mg Oral q1800  . sodium chloride  3 mL Intravenous Q12H      . sodium chloride Stopped (12/05/11 2200)    Physical Exam: The patient appears to be in no distress.  Head and neck exam reveals that the pupils are equal and reactive.  The extraocular movements are full.  There is no scleral icterus.  Mouth and pharynx are benign.  No lymphadenopathy.  No carotid bruits.  The jugular venous pressure is normal.  Thyroid is not enlarged or tender.  Chest is clear to percussion and auscultation. Decreased breath sounds at bases.  Heart reveals  no abnormal lift or heave.  First and second heart sounds are normal.  There is no murmur gallop rub or click.  The abdomen is soft and nontender.  Bowel sounds are normoactive.  There is no hepatosplenomegaly or mass.  There are no abdominal bruits.  Extremities reveal mild pretibial edema.  Pedal pulses are good.  There is no cyanosis or clubbing.  Neurologic exam is normal strength and no lateralizing weakness.  No sensory deficits.  Integument reveals no rash  Lab Results: No results found for this basename: WBC:2,HGB:2,PLT:2 in the last 72 hours  Basename 12/14/11 0630  NA 140  K 3.8  CL 95*  CO2 33*  GLUCOSE 97  BUN 24*  CREATININE 1.31   No results found for this basename: TROPONINI:2,CK,MB:2 in the last 72 hours Hepatic Function Panel No results found for this basename: PROT,ALBUMIN,AST,ALT,ALKPHOS,BILITOT,BILIDIR,IBILI in the last 72 hours No results found for this basename: CHOL in the last 72 hours No results found for this basename: PROTIME in the last 72 hours  Imaging: Imaging results have been reviewed.  No recent xray.  Cardiac Studies:  Assessment/Plan:  Patient Active Hospital Problem List: Systolic and diastolic CHF, acute on chronic (11/20/2011)   Assessment: Weight down 2 lb  Since 12/14/11  Plan Switch to oral lasix 120 mg BID today    LOS: 33 days    Cassell Clement 12/16/2011, 9:49 AM

## 2011-12-17 LAB — BASIC METABOLIC PANEL
BUN: 29 mg/dL — ABNORMAL HIGH (ref 6–23)
GFR calc non Af Amer: 47 mL/min — ABNORMAL LOW (ref >90)
Glucose, Bld: 81 mg/dL (ref 70–99)
Potassium: 3.9 mEq/L (ref 3.5–5.1)

## 2011-12-17 LAB — GLUCOSE, CAPILLARY: Glucose-Capillary: 91 mg/dL (ref 70–99)

## 2011-12-17 MED ORDER — HYDROCODONE-ACETAMINOPHEN 5-325 MG PO TABS: 1.0000 | ORAL_TABLET | ORAL | Status: AC | PRN

## 2011-12-17 MED ORDER — FUROSEMIDE 80 MG PO TABS: 120.0000 mg | ORAL_TABLET | Freq: Two times a day (BID) | ORAL | Status: DC

## 2011-12-17 MED ORDER — OXYCODONE HCL 5 MG PO TABS: 5.0000 mg | ORAL_TABLET | ORAL | Status: DC | PRN

## 2011-12-17 NOTE — Progress Notes (Signed)
Clinical Social Worker submitted appropriate paperwork to SNF[facility].  CSW phoned transport-[facility].  CSW to sign off at dc.  Angelia Mould, MSW, [place] [phone]

## 2011-12-17 NOTE — Progress Notes (Signed)
CARDIAC REHAB PHASE I   PRE:  Rate/Rhythm: 80  BP:  Supine:   Sitting: 129/82  Standing:    SaO2: 92% RA  MODE:  Ambulation: 550 ft   POST:  Rate/Rhythem: 95  BP:  Supine:   Sitting: 157/87  Standing:    SaO2: 87-90% RA  Ambulated with RW and followed with WC. Rest break x 1. Tolerated well. Education done with understanding. Back to bed with call bell in reach.  0981 - 0915  Rosalie Doctor

## 2011-12-17 NOTE — Progress Notes (Signed)
Patient Name: Antonio [name]. Date of Encounter: 12/17/2011     Principal Problem:  *Systolic and diastolic CHF, acute on chronic Active Problems:  DIABETES MELLITUS, TYPE II, UNCONTROLLED  Gout, unspecified  OBESITY, NOS  AORTIC STENOSIS  EDEMA-LEGS,DUE TO VENOUS OBSTRUCT.  Acute on chronic systolic congestive heart failure  PAF (paroxysmal atrial fibrillation)    SUBJECTIVE: In good spirits today. Breathing and leg swelling much improved. No complaints. Anxious for transfer to rehab.   OBJECTIVE  Filed Vitals:   12/16/11 1436 12/16/11 2141 12/17/11 0335 12/17/11 0651  BP: 99/66 159/89 115/66   Pulse: 62 69 73   Temp: 97.5 F (36.4 C) 98.6 F (37 C) 97.8 F (36.6 C)   TempSrc: Oral Oral Oral   Resp: 24 20 20    Height:      Weight:    154.495 kg (340 lb 9.6 oz)  SpO2: 93% 90% 93%     Intake/Output Summary (Last 24 hours) at 12/17/11 0724 Last data filed at 12/17/11 5409  Gross per 24 hour  Intake    720 ml  Output   1176 ml  Net   -456 ml   Weight change: -0.544 kg (-1 lb 3.2 oz)  PHYSICAL EXAM  General: Well developed, obese, in no acute distress. Head: Normocephalic, atraumatic, sclera non-icteric, no xanthomas.  Neck: Supple without bruits or JVD. Lungs:  Resp regular and unlabored, CTAB without wheezes, rales or rhonchi Heart: RRR no s3, s4, or murmurs. Abdomen: Soft, non-tender, non-distended, BS + x 4.  Msk:  Strength and tone appears normal for age. Extremities: Trace dependent pedal edema, no clubbing or cyanosis. DP/PT/Radials 2+ and equal bilaterally. Neuro: Alert and oriented X 3. Moves all extremities spontaneously. Psych: Normal affect.  LABS:  Lab 12/14/11 0630  NA 140  K 3.8  CL 95*  CO2 33*  BUN 24*  CREATININE 1.31  CALCIUM 9.2  PROT --  BILITOT --  ALKPHOS --  ALT --  AST --  AMYLASE --  LIPASE --  GLUCOSE 97    TELE: NSR, frequent PVCs, runs of ventricular trigeminy, one episode of NSVT yesterday  ECG: no new  tracing  Radiology/Studies:   No new studies  Current Medications:     . amiodarone  200 mg Oral BID  . antiseptic oral rinse  15 mL Mouth Rinse BID  . citalopram  20 mg Oral Daily  . colchicine  0.6 mg Oral BID  . docusate sodium  100 mg Oral Daily  . enoxaparin  40 mg Subcutaneous Q24H  . fenofibrate  160 mg Oral Daily  . furosemide  120 mg Oral BID  . glimepiride  4 mg Oral BID WC  . guaiFENesin  600 mg Oral BID  . insulin aspart  0-20 Units Subcutaneous TID WC  . insulin aspart  0-5 Units Subcutaneous QHS  . metFORMIN  500 mg Oral BID  . metoprolol tartrate  12.5 mg Oral BID  . pantoprazole  40 mg Oral Q1200  . polysaccharide iron  150 mg Oral Daily  . potassium chloride  40 mEq Oral BID  . rosuvastatin  20 mg Oral q1800  . sodium chloride  3 mL Intravenous Q12H  . DISCONTD: furosemide  80 mg Intravenous BID    ASSESSMENT AND PLAN:  1. Aortic stenosis- s/p AVR with bioprosthetic valve. His recovery post-op has been gradual; however, he has improved daily. He is being transferred to [facility] for rehab today.  2. Acute on chronic  mixed CHF- patient denies complaints today. States dyspnea and LE edema are much improved. He has diuresed well since reinitiated on Lasix IV (down over 3 lbs since 02/15). He was wwitched to Lasix 120mg  PO BID yesterday, and continued to diurese appropriately.   - Will review BMET results this AM  - Continue current Lasix regimen if renal function stable  - Close follow-up outpatient, will discuss with MD   3. Paroxysmal atrial fibrillation- maintained NSR on decreased Amiodarone dose and Lopressor. Patient has aspirin intolerance- rash. Upon further questioning, he states that this was with a combination narcotic medication. He developed a rash after receiving it for a tooth extraction. He has since taken daily ASA without incident.   - Continue Amiodarone and Lopressor on discharge  - Start on daily ASA  Signed, R. Hurman Horn,  PA-C 12/17/2011, 7:24 AM  I have personally seen and examined this patient with Hurman Horn, PA-C.  I agree with the assessment and plan as outlined above. D/c today. He can f/u with me in 2 weeks.   Ruhee Enck 8:59 AM 12/17/2011

## 2011-12-17 NOTE — Progress Notes (Addendum)
18 Days Post-Op Procedure(s) (LRB): AORTIC VALVE REPLACEMENT (AVR) (N/A)  Subjective: Patient without complaints. Looking forward to going to the facilitiy this am.  Objective: Vital signs in last 24 hours: Patient Vitals for the past 24 hrs:  BP Temp Temp src Pulse Resp SpO2 Weight  12/17/11 0651 - - - - - - 340 lb 9.6 oz (154.495 kg)  12/17/11 0335 115/66 mmHg 97.8 F (36.6 C) Oral 73  20  93 % -  12/16/11 2141 159/89 mmHg 98.6 F (37 C) Oral 69  20  90 % -  12/16/11 1436 99/66 mmHg 97.5 F (36.4 C) Oral 62  24  93 % -   Pre op weight  158.3 kg Current Weight  12/17/11 340 lb 9.6 oz (154.495 kg)       Intake/Output from previous day: 02/17 0701 - 02/18 0700 In: 720 [P.O.:720] Out: 1176 [Urine:1175; Stool:1]   Physical Exam:  Cardiovascular: RRR, no murmurs, gallops, or rubs. Pulmonary: Clear to auscultation bilaterally; no rales, wheezes, or rhonchi. Abdomen: Soft, non tender, bowel sounds present. Extremities: Bilateral lower extremity edema that continues to decrease. Chronic venous stasis changes. Wounds: Clean and dry.  No erythema or signs of infection.  Lab Results: CBC:No results found for this basename: WBC:2,HGB:2,HCT:2,PLT:2 in the last 72 hours BMET: No results found for this basename: NA:2,K:2,CL:2,CO2:2,GLUCOSE:2,BUN:2,CREATININE:2,CALCIUM:2 in the last 72 hours cosign  PT/INR: No results found for this basename: LABPROT,INR in the last 72 hours ABG:  INR: Will add last result for INR, ABG once components are confirmed Will add last 4 CBG results once components are confirmed  Assessment/Plan: Surgically stable for discharge to the SNF.    ZIMMERMAN,DONIELLE MPA-C 12/17/2011  patient examined and medical record reviewed,agree with above note. VAN TRIGT III,Dallana Mavity 12/17/2011

## 2011-12-18 NOTE — Discharge Summary (Signed)
patient examined and medical record reviewed,agree with above note. VAN TRIGT III,Juvencio Verdi 12/18/2011    

## 2011-12-21 NOTE — Progress Notes (Signed)
   CARE MANAGEMENT NOTE 12/21/2011  Patient:  Antonio [name], Antonio [name]   Account Number:  [id number]  Date Initiated:  11/30/2011  Documentation initiated by:  [facility]  Subjective/Objective Assessment:   Post op Valve on 11-29-11 - has spouse - but planning for SNF     Action/Plan:   PTA, PT INDEPENDENT, LIVES WITH SPOUSE WHO WORKS.   Anticipated DC Date:  12/04/2011   Anticipated DC Plan:  SKILLED NURSING FACILITY  In-house referral  Clinical Social Worker      DC Planning Services  CM consult      Choice offered to / List presented to:             Status of service:  Completed, signed off Medicare Important Message given?   (If response is "NO", the following Medicare IM given date fields will be blank) Date Medicare IM given:   Date Additional Medicare IM given:    Discharge Disposition:  SKILLED NURSING FACILITY  Per UR Regulation:  Reviewed for med. necessity/level of care/duration of stay  Comments:  12/17/11 Mccartney Brucks,RN,BSN PT DISCHARGING TO SNF TODAY PER CSW ARRANGEMENTS. Phone #[phone]  12/10/11 Ruthy Forry,RN,BSN 1536 [facility] WILL NOT ACCEPT PT FOR ADMISSION.  CSW COORDINATING DISCHARGE TO SNF FOR REHAB.  WILL FOLLOW ASSIST WITH DISPOSITION AS NEEDED. Phone #[phone]   11/30/11  Akasia Ahmad,RN,BSN 1510 PT WILL NEED SHORT TERM SNF AT DISCHARGE.  WILL CONSULT CSW TO FACILITATE DC TO SNF WHEN MEDICALLY STABLE. Phone #[phone]

## 2011-12-24 ENCOUNTER — Encounter: Payer: Self-pay | Admitting: Cardiovascular Disease

## 2011-12-31 ENCOUNTER — Encounter: Payer: Self-pay | Admitting: Cardiovascular Disease

## 2011-12-31 ENCOUNTER — Ambulatory Visit (INDEPENDENT_AMBULATORY_CARE_PROVIDER_SITE_OTHER): Payer: BC Managed Care – PPO | Admitting: Cardiovascular Disease

## 2011-12-31 VITALS — BP 116/60 | HR 90 | Ht 72.0 in | Wt 344.0 lb

## 2011-12-31 DIAGNOSIS — I359 Nonrheumatic aortic valve disorder, unspecified: Secondary | ICD-10-CM

## 2011-12-31 DIAGNOSIS — I4891 Unspecified atrial fibrillation: Secondary | ICD-10-CM

## 2011-12-31 DIAGNOSIS — I350 Nonrheumatic aortic (valve) stenosis: Secondary | ICD-10-CM

## 2011-12-31 DIAGNOSIS — I10 Essential (primary) hypertension: Secondary | ICD-10-CM

## 2011-12-31 DIAGNOSIS — I480 Paroxysmal atrial fibrillation: Secondary | ICD-10-CM

## 2011-12-31 DIAGNOSIS — G4730 Sleep apnea, unspecified: Secondary | ICD-10-CM

## 2011-12-31 DIAGNOSIS — I5043 Acute on chronic combined systolic (congestive) and diastolic (congestive) heart failure: Secondary | ICD-10-CM

## 2011-12-31 NOTE — Assessment & Plan Note (Signed)
S/p AVR. Doing well.

## 2011-12-31 NOTE — Assessment & Plan Note (Signed)
Volume status ok today. Will continue Lasix 120 mg po BID. Will check BMET today with recent worsening of renal function in rehab.

## 2011-12-31 NOTE — Assessment & Plan Note (Signed)
Well controlled 

## 2011-12-31 NOTE — Assessment & Plan Note (Signed)
Using CPAP and tolerating well.

## 2011-12-31 NOTE — Progress Notes (Signed)
History of Present Illness: Antonio [name] is a 61 y.o. male who presents for cardiac follow up. He has a history of severe aortic stenosis s/p AVR with bioprosthetic aortic valve  in January 2013, obstructive sleep apnea, DM, HTN, gout, hyperlipidemia, diastolic heart failure and non-ischemic cardiomyopathy who is here today for cardiac follow up. He was recently discharged from [facility] after admission 11/13/11 with volume overload, confusion which was related to acute diastolic heart failure. Echocardiogram revealed progression of his aortic stenosis. He was diuresed for several weeks in the hospital and optimized for valve surgery per Dr. Donata Clay on 11/29/2011. (Aortic valve replacement with a 27-mm pericardial [facility] Ease valve (model [id number], serial number [id number]).) He did well following surgery but required several more weeks of hospitalization for diuresis following surgery. He did have a short run of atrial fibrillation and was started on amiodarone. He has been in an inpatient rehab facility and today is his first hospital follow up. His history includes cardiac catheterization done December 2011 demonstrating normal coronary arteries.   He tells me today that he is doing well but did have recurrent gout in rehab. He has gained some fluid weight but is diuresing well on po Lasix. His creatinine bumped to 2.0 after he used Indomethacin. This was held and he has not had f/u BMET over the last few days. No awareness of irregularity of his heart rhythm. No chest pain or SOB. He has been noted to have oxygen sats around 90%. Not using supplemental O2. Weight is now decreasing. Lower ext edema improved. No cough, fever, chills. Sternal wound healing well.   Primary Care Physician: Loyal Jacobson  Last Lipid Profile:   Past Medical History  Diagnosis Date  . Hepatitis   . Chronic systolic heart failure     echo 12/11: EF 45-50%, grade 1 diast dysfxn, moderately severe AS (AVA  0.9, mean 31 mmHg), mild MR;    cath 1/12:  normal cors, AVA 1.0, peak to peak gradiet for AV 31 mmHg, mod-severe AS, EF 35-40%  . 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 Hx MRSA infection     thigh abcess 11/06  . Anxiety   . Aortic stenosis     AVR 11/29/11  . HTN (hypertension)   . DM2 (diabetes mellitus, type 2)   . HLD (hyperlipidemia)   . Gout   . NICM (nonischemic cardiomyopathy)   . Chronic diastolic heart failure     Past Surgical History  Procedure Date  . Laminectomy 10/30/83  . Aortic valve replacement 11/29/2011    Procedure: AORTIC VALVE REPLACEMENT (AVR);  Surgeon: [name], MD;  Location: [facility] OR;  Service: Open Heart Surgery;  Laterality: N/A;  with nitric oxide    Current Outpatient Prescriptions  Medication Sig Dispense Refill  . amiodarone (PACERONE) 200 MG tablet Take 1 tablet (200 mg total) by mouth 2 (two) times daily.  60 tablet    . aspirin 81 MG tablet Take 81 mg by mouth daily.        Marland Kitchen atorvastatin (LIPITOR) 40 MG tablet Take 1 tablet (40 mg total) by mouth daily.  90 tablet  3  . citalopram (CELEXA) 20 MG tablet Take 1 tablet (20 mg total) by mouth daily.      . fenofibrate (TRICOR) 145 MG tablet Take 1 tablet (145 mg total) by mouth daily.  90 tablet  3  . furosemide (LASIX) 80 MG tablet  Take 1.5 tablets (120 mg total) by mouth 2 (two) times daily.  120 tablet  3  . glimepiride (AMARYL) 4 MG tablet Take 4 mg by mouth 2 (two) times daily.        . insulin glargine (LANTUS) 100 UNIT/ML injection Inject into the skin at bedtime. As directed      . metFORMIN (GLUCOPHAGE) 500 MG tablet Take 500 mg by mouth 2 (two) times daily with a meal.      . metoprolol tartrate (LOPRESSOR) 25 MG tablet Take 1/2 tab twice a day      . Polyethylene Glycol 3350 (MIRALAX PO) Take by mouth. prn      . polysaccharide iron (NIFEREX) 150 MG CAPS capsule Take 1 capsule (150 mg total) by mouth daily.  30 each    . potassium chloride SA (K-DUR,KLOR-CON) 20 MEQ  tablet Take 2 tablets (40 mEq total) by mouth 2 (two) times daily.      Marland Kitchen senna (SENOKOT) 8.6 MG tablet Take 1 tablet by mouth daily. prn        Allergies  Allergen Reactions  . Oxycodone-Aspirin     REACTION: rash    History   Social History  . Marital Status: Married    Spouse Name: N/A    Number of Children: N/A  . Years of Education: N/A   Occupational History  . retired travelling Engineer, site    Social History Main Topics  . Smoking status: Never Smoker   . Smokeless tobacco: Not on file  . Alcohol Use: Not on file  . Drug Use: Not on file  . Sexually Active: Not on file   Other Topics Concern  . Not on file   Social History Narrative  . No narrative on file    Family History  Problem Relation Age of Onset  . Heart attack Father     Review of Systems:  As stated in the HPI and otherwise negative.   BP 116/60  Pulse 90  Ht 6' (1.829 m)  Wt 344 lb (156.037 kg)  BMI 46.65 kg/m2  Physical Examination: General: Well developed, well nourished, NAD HEENT: OP clear, mucus membranes moist SKIN: warm, dry. No rashes. Neuro: No focal deficits Musculoskeletal: Muscle strength 5/5 all ext Psychiatric: Mood and affect normal Neck: No JVD, no carotid bruits, no thyromegaly, no lymphadenopathy. Lungs:Clear bilaterally, no wheezes, rhonci, crackles Cardiovascular: Regular rate and rhythm. No murmurs, gallops or rubs. Abdomen:Soft. Bowel sounds present. Non-tender.  Extremities: Trace lower extremity edema. Chronic venous stasis changes. Pulses are 2 + in the bilateral DP/PT.

## 2011-12-31 NOTE — Patient Instructions (Signed)
Your physician recommends that you schedule a follow-up appointment in: 1 month.  Your physician has recommended that you wear a holter monitor. Holter monitors are medical devices that record the heart's electrical activity. Doctors most often use these monitors to diagnose arrhythmias. Arrhythmias are problems with the speed or rhythm of the heartbeat. The monitor is a small, portable device. You can wear one while you do your normal daily activities. This is usually used to diagnose what is causing palpitations/syncope (passing out).  You had lab work done today

## 2011-12-31 NOTE — Assessment & Plan Note (Signed)
No symptomatic recurrence since d/c to rehab. Will arrange 48 hour monitor. If no atrial fib, will d/c amiodarone.

## 2012-01-01 ENCOUNTER — Telehealth: Payer: Self-pay

## 2012-01-01 ENCOUNTER — Other Ambulatory Visit: Payer: Self-pay | Admitting: Cardiothoracic Surgery

## 2012-01-01 DIAGNOSIS — I359 Nonrheumatic aortic valve disorder, unspecified: Secondary | ICD-10-CM

## 2012-01-01 LAB — CBC WITH DIFFERENTIAL/PLATELET
Basophils Relative: 0.3 % (ref 0.0–3.0)
Eosinophils Absolute: 0.2 10*3/uL (ref 0.0–0.7)
HCT: 30.5 % — ABNORMAL LOW (ref 39.0–52.0)
Hemoglobin: 9.8 g/dL — ABNORMAL LOW (ref 13.0–17.0)
Lymphs Abs: 2.4 10*3/uL (ref 0.7–4.0)
MCHC: 32.2 g/dL (ref 30.0–36.0)
MCV: 80.8 fl (ref 78.0–100.0)
Monocytes Absolute: 0.9 10*3/uL (ref 0.1–1.0)
Neutro Abs: 8.7 10*3/uL — ABNORMAL HIGH (ref 1.4–7.7)
RBC: 3.77 Mil/uL — ABNORMAL LOW (ref 4.22–5.81)

## 2012-01-01 LAB — BASIC METABOLIC PANEL
CO2: 28 mEq/L (ref 19–32)
Chloride: 97 mEq/L (ref 96–112)
Creatinine, Ser: 1.5 mg/dL (ref 0.4–1.5)
Sodium: 135 mEq/L (ref 135–145)

## 2012-01-02 ENCOUNTER — Encounter: Payer: Self-pay | Admitting: Cardiothoracic Surgery

## 2012-01-02 ENCOUNTER — Ambulatory Visit
Admission: RE | Admit: 2012-01-02 | Discharge: 2012-01-02 | Disposition: A | Discharge status: Home / Self Care | Payer: BC Managed Care – PPO | Source: Ambulatory Visit | Attending: Cardiothoracic Surgery | Admitting: Cardiothoracic Surgery

## 2012-01-02 ENCOUNTER — Ambulatory Visit (INDEPENDENT_AMBULATORY_CARE_PROVIDER_SITE_OTHER): Payer: Self-pay | Admitting: Cardiothoracic Surgery

## 2012-01-02 ENCOUNTER — Telehealth: Payer: Self-pay | Admitting: Cardiovascular Disease

## 2012-01-02 VITALS — BP 167/71 | HR 90 | Resp 20 | Ht 72.0 in | Wt 341.0 lb

## 2012-01-02 DIAGNOSIS — I509 Heart failure, unspecified: Secondary | ICD-10-CM

## 2012-01-02 DIAGNOSIS — M109 Gout, unspecified: Secondary | ICD-10-CM

## 2012-01-02 DIAGNOSIS — I359 Nonrheumatic aortic valve disorder, unspecified: Secondary | ICD-10-CM

## 2012-01-02 DIAGNOSIS — I350 Nonrheumatic aortic (valve) stenosis: Secondary | ICD-10-CM

## 2012-01-02 DIAGNOSIS — Z952 Presence of prosthetic heart valve: Secondary | ICD-10-CM

## 2012-01-02 DIAGNOSIS — Z954 Presence of other heart-valve replacement: Secondary | ICD-10-CM

## 2012-01-02 NOTE — Progress Notes (Signed)
PCP is [name], MD, MD Referring Provider is Verne Carrow, *  Chief Complaint  Patient presents with  . Routine Post Op    4 week f/u from surgery with CXR, S/P AVR on 11/29/11    HPI: Status post aortic valve replacement with a tissue valve one month ago for severe aortic stenosis Do do obesity and deconditioning he is been at a skilled nursing facility since discharge. He is maintained sinus rhythm on oral amiodarone which is being tapered by his cardiologist. He is progressing with his strength and ambulation potential but still has some desaturation according to his history Last 24 hours he's had pain swelling consistent with gout of his right foot I prescribed colchicine and then Zyloprim.   Past Medical History  Diagnosis Date  . Hepatitis   . Chronic systolic heart failure     echo 12/11: EF 45-50%, grade 1 diast dysfxn, moderately severe AS (AVA 0.9, mean 31 mmHg), mild MR;    cath 1/12:  normal cors, AVA 1.0, peak to peak gradiet for AV 31 mmHg, mod-severe AS, EF 35-40%  . 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 Hx MRSA infection     thigh abcess 11/06  . Anxiety   . Aortic stenosis     AVR 11/29/11  . HTN (hypertension)   . DM2 (diabetes mellitus, type 2)   . HLD (hyperlipidemia)   . Gout   . NICM (nonischemic cardiomyopathy)   . Chronic diastolic heart failure     Past Surgical History  Procedure Date  . Laminectomy 10/30/83  . Aortic valve replacement 11/29/2011    Procedure: AORTIC VALVE REPLACEMENT (AVR);  Surgeon: [name], MD;  Location: [facility] OR;  Service: Open Heart Surgery;  Laterality: N/A;  with nitric oxide    Family History  Problem Relation Age of Onset  . Heart attack Father     Social History History  Substance Use Topics  . Smoking status: Never Smoker   . Smokeless tobacco: Not on file  . Alcohol Use: Not on file    Current Outpatient Prescriptions  Medication Sig Dispense Refill  . amiodarone  (PACERONE) 200 MG tablet Take 1 tablet (200 mg total) by mouth 2 (two) times daily.  60 tablet    . aspirin 81 MG tablet Take 81 mg by mouth daily.        Marland Kitchen atorvastatin (LIPITOR) 40 MG tablet Take 1 tablet (40 mg total) by mouth daily.  90 tablet  3  . citalopram (CELEXA) 20 MG tablet Take 1 tablet (20 mg total) by mouth daily.      . fenofibrate (TRICOR) 145 MG tablet Take 1 tablet (145 mg total) by mouth daily.  90 tablet  3  . furosemide (LASIX) 80 MG tablet Take 1.5 tablets (120 mg total) by mouth 2 (two) times daily.  120 tablet  3  . glimepiride (AMARYL) 4 MG tablet Take 4 mg by mouth 2 (two) times daily.        . insulin glargine (LANTUS) 100 UNIT/ML injection Inject into the skin at bedtime. As directed      . metFORMIN (GLUCOPHAGE) 500 MG tablet Take 500 mg by mouth 2 (two) times daily with a meal.      . metoprolol tartrate (LOPRESSOR) 25 MG tablet Take 1/2 tab twice a day      . Polyethylene Glycol 3350 (MIRALAX PO) Take by mouth. prn      .  polysaccharide iron (NIFEREX) 150 MG CAPS capsule Take 1 capsule (150 mg total) by mouth daily.  30 each    . potassium chloride SA (K-DUR,KLOR-CON) 20 MEQ tablet Take 2 tablets (40 mEq total) by mouth 2 (two) times daily.      Marland Kitchen senna (SENOKOT) 8.6 MG tablet Take 1 tablet by mouth daily. prn        Allergies  Allergen Reactions  . Oxycodone-Aspirin     REACTION: rash    Review of Systems gradually improving strength reducing weight no symptoms of chest pain syncope or CHF   BP 167/71  Pulse 90  Resp 20  Ht 6' (1.829 m)  Wt 341 lb (154.677 kg)  BMI 46.25 kg/m2  SpO2 90% Physical Exam Alert and oriented Lungs clear Soft flow murmur through the valve Sternal incision well-healed Right foot swollen with erythema consistent with gout   Diagnostic Tests:  Chest x-ray shows some atelectasis at the left base with perhaps a small bowel pleural effusion Impression: Stable course one month after aVR. Patient ready to be discharged  home from [facility]. Continue current medications. I provided him with Zyloprim and and colchicine for his gout as needed until he can see his primary care physician He was instructed he can resume driving increases activity level but not to lift more than 20 pounds until May 1.  Plan: Return in 4 weeks the chest x-ray for review

## 2012-01-02 NOTE — Telephone Encounter (Signed)
Pt called my cell phone tonight. He is having severe gout pain and has gained 4 pounds in last 24 hours. He is in a rehab facility post AVR. He tells me that there is no doctor there to address acute issues. I have offered to give him prednisone for his gout flare. He was started on colchicine today by Dr. Donata Clay at CT surgery follow up appt. Can we call him on his cell phone [phone] and find out how to order Prednisone for him in the rehab facility? I would recommend 40 mg of prednisone for two days then 20 mg for two days then stop. I am not sure why the rehab doctor can't do this but we can help out. It may be that they just need an order from [place] in the facility to give the med. I have also asked him to increase his Lasix to 120 mg po TID for the next three days then resume 120 mg po BID. Can we call him and help him sort this out? Thanks, Thayer Ohm

## 2012-01-02 NOTE — Patient Instructions (Signed)
You may drive He may lift up to 20 pounds He may get and a hot or bath You are ready for discharge home from skilled nursing when you can ambulate after the gout flare has improved

## 2012-01-03 NOTE — Telephone Encounter (Signed)
Patient called was told of Dr Gibson Ramp order for prednisone and increase lasix.Patient living at [facility] [place] [place] at [facility] was notified to have patient take prednisone 40 mg daily for 2 days then decrease to 20 mg daily for 2 days then stop.Increase lasix 120 mg tid for 3 days the resume previous dose 120 mg bid.

## 2012-01-17 ENCOUNTER — Telehealth: Payer: Self-pay | Admitting: Cardiovascular Disease

## 2012-01-17 NOTE — Telephone Encounter (Signed)
Call was made by Marcos Eke to set up monitor appt. Will route to Windell Moulding to contact pt

## 2012-01-17 NOTE — Telephone Encounter (Signed)
New Msg: Aggie Cosier from [facility] calling wanting to speak with nurse to make sure Cardiac Rehab Referral Form was received. Please complete form and return. Please return Aggie Cosier call to discuss further if necessary.

## 2012-01-17 NOTE — Telephone Encounter (Signed)
Fu call °Patient returning your call °

## 2012-01-17 NOTE — Telephone Encounter (Signed)
Spoke with Aggie Cosier and told her I have cardiac rehab form and will have Dr. Clifton James complete when back in office on January 21, 2012. Will route to Windell Moulding to contact pt regarding monitor appt

## 2012-01-18 ENCOUNTER — Other Ambulatory Visit: Payer: Self-pay

## 2012-01-18 ENCOUNTER — Encounter (INDEPENDENT_AMBULATORY_CARE_PROVIDER_SITE_OTHER): Payer: BC Managed Care – PPO

## 2012-01-18 DIAGNOSIS — I350 Nonrheumatic aortic (valve) stenosis: Secondary | ICD-10-CM

## 2012-01-18 DIAGNOSIS — I359 Nonrheumatic aortic valve disorder, unspecified: Secondary | ICD-10-CM

## 2012-01-18 DIAGNOSIS — I4891 Unspecified atrial fibrillation: Secondary | ICD-10-CM

## 2012-01-18 NOTE — Telephone Encounter (Signed)
PATIENT HAD MONITOR PUT ON TODAY

## 2012-01-22 ENCOUNTER — Other Ambulatory Visit: Payer: Self-pay | Admitting: Cardiothoracic Surgery

## 2012-01-22 DIAGNOSIS — I359 Nonrheumatic aortic valve disorder, unspecified: Secondary | ICD-10-CM

## 2012-01-23 ENCOUNTER — Telehealth: Payer: Self-pay | Admitting: Cardiovascular Disease

## 2012-01-23 NOTE — Telephone Encounter (Signed)
Left message with patient today regarding his Holter which showed PVC/PACs. No atrial fib. He can stop amiodarone. cdm

## 2012-01-24 ENCOUNTER — Telehealth: Payer: Self-pay | Admitting: Cardiovascular Disease

## 2012-01-24 NOTE — Telephone Encounter (Signed)
Follow-up:    Patient called in just to let you know that he is doing well and will see you at his appointment on 4/4.

## 2012-01-24 NOTE — Telephone Encounter (Signed)
I spoke to him again today. cdm

## 2012-01-30 ENCOUNTER — Ambulatory Visit: Payer: Self-pay | Admitting: Cardiothoracic Surgery

## 2012-01-30 ENCOUNTER — Ambulatory Visit
Admission: RE | Admit: 2012-01-30 | Discharge: 2012-01-30 | Disposition: A | Discharge status: Home / Self Care | Payer: BC Managed Care – PPO | Source: Ambulatory Visit | Attending: Cardiothoracic Surgery | Admitting: Cardiothoracic Surgery

## 2012-01-30 ENCOUNTER — Encounter: Payer: Self-pay | Admitting: Cardiothoracic Surgery

## 2012-01-30 ENCOUNTER — Ambulatory Visit (INDEPENDENT_AMBULATORY_CARE_PROVIDER_SITE_OTHER): Payer: BC Managed Care – PPO | Admitting: Cardiothoracic Surgery

## 2012-01-30 VITALS — BP 132/74 | HR 100 | Resp 20 | Ht 72.0 in | Wt 325.0 lb

## 2012-01-30 DIAGNOSIS — Z952 Presence of prosthetic heart valve: Secondary | ICD-10-CM

## 2012-01-30 DIAGNOSIS — I509 Heart failure, unspecified: Secondary | ICD-10-CM

## 2012-01-30 DIAGNOSIS — M109 Gout, unspecified: Secondary | ICD-10-CM

## 2012-01-30 DIAGNOSIS — I359 Nonrheumatic aortic valve disorder, unspecified: Secondary | ICD-10-CM

## 2012-01-30 DIAGNOSIS — Z954 Presence of other heart-valve replacement: Secondary | ICD-10-CM

## 2012-01-30 NOTE — Progress Notes (Signed)
PCP is [name], MD, MD Referring Provider is [name]*  Chief Complaint  Patient presents with  . Routine Post Op    4 week f/u with CXR                     [address]            [name] [address]          [phone]       HPI: The patient returns for final visit after undergoing aortic valve replacement 11/28/2010 for severe aortic stenosis. He is started outpatient cardiac rehabilitation. His gout has resolved. He is gradually losing weight down to 325 pounds. He has no symptoms of CHF. 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 He denies symptoms of CHF but he does have some chronically edematous feet from venous disease   Past Medical History  Diagnosis Date  . Hepatitis   . Chronic systolic heart failure     echo 12/11: EF 45-50%, grade 1 diast dysfxn, moderately severe AS (AVA 0.9, mean 31 mmHg), mild MR;    cath 1/12:  normal cors, AVA 1.0, peak to peak gradiet for AV 31 mmHg, mod-severe AS, EF 35-40%  . 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 Hx MRSA infection     thigh abcess 11/06  . Anxiety   . Aortic stenosis     AVR 11/29/11  . HTN (hypertension)   . DM2 (diabetes mellitus, type 2)   . HLD (hyperlipidemia)   . Gout   . NICM (nonischemic cardiomyopathy)   . Chronic diastolic heart failure     Past Surgical History  Procedure Date  . Laminectomy 10/30/83  . Aortic valve replacement 11/29/2011    Procedure: AORTIC VALVE REPLACEMENT (AVR);  Surgeon: [name], MD;  Location: [facility] OR;  Service: Open Heart Surgery;  Laterality: N/A;  with nitric oxide    Family History  Problem Relation Age of Onset  . Heart attack Father     Social History History  Substance Use Topics  . Smoking status: Never Smoker   . Smokeless tobacco: Not on file  . Alcohol Use: Not on file    Current Outpatient Prescriptions  Medication Sig Dispense Refill  . allopurinol (ZYLOPRIM) 100 MG  tablet Take 100 mg by mouth daily.      Marland Kitchen aspirin 81 MG tablet Take 81 mg by mouth daily.        Marland Kitchen atorvastatin (LIPITOR) 40 MG tablet Take 1 tablet (40 mg total) by mouth daily.  90 tablet  3  . citalopram (CELEXA) 20 MG tablet Take 1 tablet (20 mg total) by mouth daily.      . colchicine 0.6 MG tablet Take 0.6 mg by mouth as needed.       . fenofibrate (TRICOR) 145 MG tablet Take 1 tablet (145 mg total) by mouth daily.  90 tablet  3  . furosemide (LASIX) 80 MG tablet Take 1.5 tablets (120 mg total) by mouth 2 (two) times daily.  120 tablet  3  . glimepiride (AMARYL) 4 MG tablet Take 4 mg by mouth 2 (two) times daily.        . insulin glargine (LANTUS) 100 UNIT/ML injection Inject 10 Units into the skin at bedtime. As directed      . metFORMIN (GLUCOPHAGE) 500 MG tablet Take 500 mg by mouth 2 (  two) times daily with a meal.      . metoprolol tartrate (LOPRESSOR) 25 MG tablet Take 1/2 tab twice a day      . Polyethylene Glycol 3350 (MIRALAX PO) Take by mouth. prn      . polysaccharide iron (NIFEREX) 150 MG CAPS capsule Take 1 capsule (150 mg total) by mouth daily.  30 each    . potassium chloride SA (K-DUR,KLOR-CON) 20 MEQ tablet Take 2 tablets (40 mEq total) by mouth 2 (two) times daily.      Marland Kitchen senna (SENOKOT) 8.6 MG tablet Take 1 tablet by mouth daily. prn        Allergies  Allergen Reactions  . Oxycodone-Aspirin     REACTION: rash    Review of Systems no fever no productive cough no syncope  BP 132/74  Pulse 100  Resp 20  Ht 6' (1.829 m)  Wt 325 lb (147.419 kg)  BMI 44.08 kg/m2  SpO2 92% Physical Exam General alert comfortable Lungs clear Cardiac rhythm regular without murmur Sternal incision well-healed  Diagnostic Tests: Chest x-ray today shows complete resolution of the small left pleural effusion noted 3 weeks ago. Sternal wires intact cardiac silhouette normal  Impression: Stable course 2 months after aVR. He'll complete his cardiac rehabilitation program. He was  given a note for return to work on April 11. He'll return here as needed. He understands importance of antibiotic prophylaxis or dental procedures.  Plan:

## 2012-01-30 NOTE — Patient Instructions (Signed)
You may return to work on April 11 Do not lift more than 20 pounds until 3 months after the date of surgery

## 2012-01-31 ENCOUNTER — Ambulatory Visit: Payer: BC Managed Care – PPO | Admitting: Cardiovascular Disease

## 2012-02-13 ENCOUNTER — Telehealth: Payer: Self-pay | Admitting: Cardiovascular Disease

## 2012-02-13 NOTE — Telephone Encounter (Signed)
Patient called back to say there is no need for call back, as he is in touch with other Dr. Who is addressing the situation.

## 2012-02-13 NOTE — Telephone Encounter (Signed)
We have not received any paperwork in office to be completed for CPAP authorization. I called and spoke with Marlane Hatcher at [facility] and he confirms they have all information they need and CPAP has been authorized. I called to give pt this information and left message to call back

## 2012-02-13 NOTE — Telephone Encounter (Signed)
Please return call to patient at [phone]   Patient CPAP has been denied pending authorization from Dr. Clifton James.  Patient would like a return call as well as a call made to [facility] [phone], Fax [phone]).  Patient has noted this is the 3rd time he has attempted to address this issue.

## 2012-02-19 ENCOUNTER — Encounter: Payer: Self-pay | Admitting: Cardiovascular Disease

## 2012-02-19 ENCOUNTER — Ambulatory Visit (INDEPENDENT_AMBULATORY_CARE_PROVIDER_SITE_OTHER): Payer: BC Managed Care – PPO | Admitting: Cardiovascular Disease

## 2012-02-19 VITALS — BP 140/80 | HR 90 | Wt 325.0 lb

## 2012-02-19 DIAGNOSIS — I359 Nonrheumatic aortic valve disorder, unspecified: Secondary | ICD-10-CM

## 2012-02-19 DIAGNOSIS — I5032 Chronic diastolic (congestive) heart failure: Secondary | ICD-10-CM

## 2012-02-19 DIAGNOSIS — I509 Heart failure, unspecified: Secondary | ICD-10-CM

## 2012-02-19 NOTE — Patient Instructions (Signed)
The current medical regimen is effective;  continue present plan and medications.  Follow up in 6 months with Dr Sanjuana Kava.  You will receive a letter in the mail 2 months before you are due.  Please call us when you receive this letter to schedule your follow up appointment.

## 2012-02-19 NOTE — Assessment & Plan Note (Addendum)
S/p AVR with bioprosthetic aortic valve. Doing well. Continue rehab, weight loss program.

## 2012-02-19 NOTE — Progress Notes (Signed)
History of Present Illness: 61 yo WM with a history of severe aortic stenosis s/p AVR with bioprosthetic aortic valve in January 2013, obstructive sleep apnea, DM, HTN, gout, hyperlipidemia, diastolic heart failure and non-ischemic cardiomyopathy who is here today for cardiac follow up. He was recently discharged from [facility] after admission 11/13/11 with volume overload, confusion which was related to acute diastolic heart failure. Echocardiogram revealed progression of his aortic stenosis. He was diuresed for several weeks in the hospital and optimized for valve surgery per Dr. Donata Clay on 11/29/2011. (Aortic valve replacement with a 27-mm pericardial [facility] Ease valve (model 3300TFX, serial number [id number]).) He did well following surgery but required several more weeks of hospitalization for diuresis following surgery. He did have a short run of atrial fibrillation and was started on amiodarone. He was discharged to a short term rehab facility. He was last seen in our office 12/31/11. His history includes left heart catheterization done December 2011 demonstrating normal coronary arteries.   He is here today for follow up. No chest pain or SOB. No signs of heart failure.  Weight is now decreasing (344 pounds last visit here 6 weeks ago, now 325 pounds).  Lower ext edema improved. No cough, fever, chills. Sternal wound healing well. BP is well controlled at home.   Primary Care Physician: Loyal Jacobson  Past Medical History  Diagnosis Date  . Hepatitis   . Chronic systolic heart failure     echo 12/11: EF 45-50%, grade 1 diast dysfxn, moderately severe AS (AVA 0.9, mean 31 mmHg), mild MR;    cath 1/12:  normal cors, AVA 1.0, peak to peak gradiet for AV 31 mmHg, mod-severe AS, EF 35-40%  . 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 Hx MRSA infection     thigh abcess 11/06  . Anxiety   . Aortic stenosis     AVR 11/29/11  . HTN (hypertension)   . DM2 (diabetes mellitus,  type 2)   . HLD (hyperlipidemia)   . Gout   . NICM (nonischemic cardiomyopathy)   . Chronic diastolic heart failure     Past Surgical History  Procedure Date  . Laminectomy 10/30/83  . Aortic valve replacement 11/29/2011    Procedure: AORTIC VALVE REPLACEMENT (AVR);  Surgeon: [name], MD;  Location: [facility] OR;  Service: Open Heart Surgery;  Laterality: N/A;  with nitric oxide    Current Outpatient Prescriptions  Medication Sig Dispense Refill  . allopurinol (ZYLOPRIM) 100 MG tablet Take 100 mg by mouth daily.      Marland Kitchen aspirin 81 MG tablet Take 81 mg by mouth daily.        Marland Kitchen atorvastatin (LIPITOR) 40 MG tablet Take 1 tablet (40 mg total) by mouth daily.  90 tablet  3  . colchicine 0.6 MG tablet Take 0.6 mg by mouth as needed.       . fenofibrate (TRICOR) 145 MG tablet Take 1 tablet (145 mg total) by mouth daily.  90 tablet  3  . furosemide (LASIX) 80 MG tablet Take 2 tabs in am and one tab in the pm       . glimepiride (AMARYL) 4 MG tablet Take 4 mg by mouth 2 (two) times daily.        . insulin glargine (LANTUS) 100 UNIT/ML injection Inject 1-2 Units into the skin at bedtime. As directed      . metFORMIN (GLUCOPHAGE) 500 MG tablet Take 500  mg by mouth 2 (two) times daily with a meal.      . Multiple Vitamins-Minerals (MULTIVITAMIN WITH MINERALS) tablet Take 1 tablet by mouth daily.      Marland Kitchen DISCONTD: furosemide (LASIX) 80 MG tablet Take 1.5 tablets (120 mg total) by mouth 2 (two) times daily.  120 tablet  3    Allergies  Allergen Reactions  . Oxycodone-Aspirin     REACTION: rash    History   Social History  . Marital Status: Married    Spouse Name: N/A    Number of Children: N/A  . Years of Education: N/A   Occupational History  . retired travelling Engineer, site    Social History Main Topics  . Smoking status: Never Smoker   . Smokeless tobacco: Not on file  . Alcohol Use: Not on file  . Drug Use: Not on file  . Sexually Active: Not on file   Other Topics  Concern  . Not on file   Social History Narrative  . No narrative on file    Family History  Problem Relation Age of Onset  . Heart attack Father     Review of Systems:  As stated in the HPI and otherwise negative.   BP 140/80  Pulse 90  Wt 325 lb (147.419 kg)  Physical Examination: General: Well developed, well nourished, NAD HEENT: OP clear, mucus membranes moist SKIN: warm, dry. No rashes. Neuro: No focal deficits Musculoskeletal: Muscle strength 5/5 all ext Psychiatric: Mood and affect normal Neck: No JVD, no carotid bruits, no thyromegaly, no lymphadenopathy. Lungs:Clear bilaterally, no wheezes, rhonci, crackles Cardiovascular: Regular rate and rhythm. No murmurs, gallops or rubs. Abdomen:Soft. Bowel sounds present. Non-tender.  Extremities: No lower extremity edema. Pulses are 2 + in the bilateral DP/PT.

## 2012-02-19 NOTE — Assessment & Plan Note (Signed)
Volume status is ok today. Continue current dose of Lasix.

## 2012-03-21 ENCOUNTER — Telehealth: Payer: Self-pay | Admitting: Cardiovascular Disease

## 2012-03-21 NOTE — Telephone Encounter (Signed)
Left message to call back  

## 2012-03-21 NOTE — Telephone Encounter (Signed)
Pt needs a note to return to work on June 1st and he would like this mailed to him

## 2012-03-25 ENCOUNTER — Encounter: Payer: Self-pay | Admitting: *Deleted

## 2012-03-25 ENCOUNTER — Telehealth: Payer: Self-pay | Admitting: Cardiovascular Disease

## 2012-03-25 NOTE — Telephone Encounter (Signed)
New msg Pt wanted to thank you for sending letter

## 2012-03-25 NOTE — Telephone Encounter (Signed)
Will forward to [employer] to await call back

## 2012-03-25 NOTE — Telephone Encounter (Signed)
Antonio Bible, can we take care of this. chris

## 2012-03-25 NOTE — Telephone Encounter (Signed)
Letter written and mailed to pt. Message left for pt with this information

## 2012-03-25 NOTE — Telephone Encounter (Signed)
I believe this is Dr Gibson Ramp patient. I will forward.

## 2012-05-02 ENCOUNTER — Telehealth: Payer: Self-pay | Admitting: Cardiovascular Disease

## 2012-05-02 DIAGNOSIS — I359 Nonrheumatic aortic valve disorder, unspecified: Secondary | ICD-10-CM

## 2012-05-02 MED ORDER — AMOXICILLIN 500 MG PO CAPS: ORAL_CAPSULE | ORAL | Status: DC

## 2012-05-02 NOTE — Telephone Encounter (Signed)
Spoke with pt and told him he will need antibiotics prior to dental procedures. He is not allergic to amoxicillin. Will send prescription to [facility] on [address] in [place]. He is aware to take 60 minutes prior to dental procedure

## 2012-05-02 NOTE — Telephone Encounter (Signed)
Please return call to patient at [phone] regarding tooth extraction procedure 05/05/12 at 1:40pm.  Cardi doc to decide if antibiotics needed for this procedure on Monday.

## 2012-08-14 ENCOUNTER — Telehealth: Payer: Self-pay | Admitting: Cardiovascular Disease

## 2012-08-14 NOTE — Telephone Encounter (Signed)
New problem:  Discuss medication.

## 2012-08-14 NOTE — Telephone Encounter (Signed)
Left message to call back  

## 2012-08-14 NOTE — Telephone Encounter (Signed)
If his BP is ok, he can continue these meds. cdm

## 2012-08-14 NOTE — Telephone Encounter (Signed)
Spoke with Antonio [name] who is asking for clarification of medicines he should be taking.  Antonio [name] reports he has been taking Carvedilol 6.25 mg by mouth twice daily and Lisinopril 40 mg daily. Our records indicate these were both stopped on Feb. 13, 2013.  He is also taking Amlodipine 10 mg daily. Our records indicate this was stopped in July 2012.  He states he is not taking Lipitor.  Furosemide is managed by his primary provider.   Antonio [name] does not know what blood pressure is but states it is OK when checked at provider's office.  Antonio [name] states he is feeling OK--achy with the weather change but no other complaints.  Has appt with Antonio [name] on November 26,2013.  Will review with Antonio [name]

## 2012-08-15 NOTE — Telephone Encounter (Signed)
Pt rtn call to pat, pls call [phone]

## 2012-08-15 NOTE — Telephone Encounter (Signed)
Spoke with pt and gave him instructions from Dr. Clifton James.  He will continue meds and bring all medicine bottles with him to appt in November with Dr. Clifton James.  Will update list at this office visit.  He states he does not need refills of these medications.

## 2012-08-25 DIAGNOSIS — Z0279 Encounter for issue of other medical certificate: Secondary | ICD-10-CM

## 2012-09-23 ENCOUNTER — Ambulatory Visit: Payer: BC Managed Care – PPO | Admitting: Cardiovascular Disease

## 2012-10-08 ENCOUNTER — Emergency Department (HOSPITAL_COMMUNITY)
Admission: EM | Admit: 2012-10-08 | Discharge: 2012-10-08 | Disposition: A | Discharge status: Home / Self Care | Payer: BC Managed Care – PPO | Attending: Emergency Medicine | Admitting: Emergency Medicine

## 2012-10-08 ENCOUNTER — Encounter (HOSPITAL_COMMUNITY): Payer: Self-pay | Admitting: Family Medicine

## 2012-10-08 DIAGNOSIS — Z8614 Personal history of Methicillin resistant Staphylococcus aureus infection: Secondary | ICD-10-CM | POA: Insufficient documentation

## 2012-10-08 DIAGNOSIS — L03019 Cellulitis of unspecified finger: Secondary | ICD-10-CM | POA: Insufficient documentation

## 2012-10-08 DIAGNOSIS — Z8619 Personal history of other infectious and parasitic diseases: Secondary | ICD-10-CM | POA: Insufficient documentation

## 2012-10-08 DIAGNOSIS — I5032 Chronic diastolic (congestive) heart failure: Secondary | ICD-10-CM | POA: Insufficient documentation

## 2012-10-08 DIAGNOSIS — E785 Hyperlipidemia, unspecified: Secondary | ICD-10-CM | POA: Insufficient documentation

## 2012-10-08 DIAGNOSIS — Z952 Presence of prosthetic heart valve: Secondary | ICD-10-CM | POA: Insufficient documentation

## 2012-10-08 DIAGNOSIS — Z8679 Personal history of other diseases of the circulatory system: Secondary | ICD-10-CM | POA: Insufficient documentation

## 2012-10-08 DIAGNOSIS — H5440 Blindness, one eye, unspecified eye: Secondary | ICD-10-CM | POA: Insufficient documentation

## 2012-10-08 DIAGNOSIS — L03012 Cellulitis of left finger: Secondary | ICD-10-CM

## 2012-10-08 DIAGNOSIS — I10 Essential (primary) hypertension: Secondary | ICD-10-CM | POA: Insufficient documentation

## 2012-10-08 DIAGNOSIS — Z79899 Other long term (current) drug therapy: Secondary | ICD-10-CM | POA: Insufficient documentation

## 2012-10-08 DIAGNOSIS — M109 Gout, unspecified: Secondary | ICD-10-CM | POA: Insufficient documentation

## 2012-10-08 DIAGNOSIS — Z7982 Long term (current) use of aspirin: Secondary | ICD-10-CM | POA: Insufficient documentation

## 2012-10-08 DIAGNOSIS — I5022 Chronic systolic (congestive) heart failure: Secondary | ICD-10-CM | POA: Insufficient documentation

## 2012-10-08 DIAGNOSIS — Z794 Long term (current) use of insulin: Secondary | ICD-10-CM | POA: Insufficient documentation

## 2012-10-08 DIAGNOSIS — E119 Type 2 diabetes mellitus without complications: Secondary | ICD-10-CM | POA: Insufficient documentation

## 2012-10-08 MED ORDER — CLINDAMYCIN HCL 300 MG PO CAPS: 300.0000 mg | ORAL_CAPSULE | Freq: Four times a day (QID) | ORAL | Status: DC

## 2012-10-08 MED ORDER — CLINDAMYCIN HCL 150 MG PO CAPS
300.0000 mg | ORAL_CAPSULE | Freq: Once | ORAL | Status: AC
  Administered 2012-10-08: 300 mg via ORAL
  Filled 2012-10-08: qty 2

## 2012-10-08 NOTE — ED Notes (Signed)
Per pt sts infection in his left thumb for a few days. Given abx but not getting better. sts very painful.

## 2012-10-08 NOTE — ED Provider Notes (Signed)
History   This chart was scribed for Richardean Canal, MD by [name], ED Scribe. The patient was seen in room TR08C/TR08C. Patient's care was started at 1336.  CSN: [id number]  Arrival date & time 10/08/12  1336   First MD Initiated Contact with Patient 10/08/12 1453      Chief Complaint  Patient presents with  . Wound Infection    HPI  Antonio [name]. is a 61 y.o. male with h/o hepatitis, HTN, and DM2, who presents to the Emergency Department complaining of 1 week of new, progressive, moderate abscess to the left thumb, with associate redness and swelling after bighting his fingernails. Pain is sharp, aggravated by pressure, and alleviated by nothing. Pt reports going to urgent care 1 week ago, Rx with Durcef. Pt reports no improvement despite use of Rx. No fever, chills, cough, congestion, rhinorrhea, chest pain, SOB, or n/v/d. Pt denies use of tobacco products, consumption of alcohol, and use of illicit drugs. Hx of MRSA infection in the past.       Past Medical History  Diagnosis Date  . Hepatitis   . Chronic systolic heart failure     echo 12/11: EF 45-50%, grade 1 diast dysfxn, moderately severe AS (AVA 0.9, mean 31 mmHg), mild MR;    cath 1/12:  normal cors, AVA 1.0, peak to peak gradiet for AV 31 mmHg, mod-severe AS, EF 35-40%  . 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 Hx MRSA infection     thigh abcess 11/06  . Anxiety   . Aortic stenosis     AVR 11/29/11  . HTN (hypertension)   . DM2 (diabetes mellitus, type 2)   . HLD (hyperlipidemia)   . Gout   . NICM (nonischemic cardiomyopathy)   . Chronic diastolic heart failure     Past Surgical History  Procedure Date  . Laminectomy 10/30/83  . Aortic valve replacement 11/29/2011    Procedure: AORTIC VALVE REPLACEMENT (AVR);  Surgeon: [name], MD;  Location: [facility] OR;  Service: Open Heart Surgery;  Laterality: N/A;  with nitric oxide    Family History  Problem Relation Age of Onset  . Heart attack  Father     History  Substance Use Topics  . Smoking status: Never Smoker   . Smokeless tobacco: Not on file  . Alcohol Use: Not on file      Review of Systems  Musculoskeletal:       R thumb pain   Skin: Positive for wound.  All other systems reviewed and are negative.    Allergies  Review of patient's allergies indicates no known allergies.  Home Medications   Current Outpatient Rx  Name  Route  Sig  Dispense  Refill  . ALLOPURINOL 100 MG PO TABS   Oral   Take 100 mg by mouth daily.         . ASPIRIN 81 MG PO TABS   Oral   Take 81 mg by mouth daily.           . ATORVASTATIN CALCIUM 40 MG PO TABS   Oral   Take 1 tablet (40 mg total) by mouth daily.   90 tablet   3   . CEFADROXIL 500 MG PO CAPS   Oral   Take 500 mg by mouth 2 (two) times daily. Hand infection.         . COLCHICINE 0.6 MG PO TABS   Oral  Take 0.6 mg by mouth daily. gout         . FENOFIBRATE 145 MG PO TABS   Oral   Take 145 mg by mouth daily.         . FUROSEMIDE 80 MG PO TABS   Oral   Take 80-160 mg by mouth 2 (two) times daily. Take 2 tabs in am and one tab in the pm         . HYDROCODONE-ACETAMINOPHEN 10-500 MG PO TABS   Oral   Take 1 tablet by mouth every 6 (six) hours as needed. pain         . INSULIN GLARGINE 100 UNIT/ML [place] SOLN   Subcutaneous   Inject 10 Units into the skin at bedtime. As directed         . TERBINAFINE HCL 250 MG PO TABS   Oral   Take 250 mg by mouth daily.         . AMOXICILLIN 500 MG PO CAPS      Take 4 tablets by mouth 60 minutes prior to dental procedure   4 capsule   2   . CLINDAMYCIN HCL 300 MG PO CAPS   Oral   Take 1 capsule (300 mg total) by mouth 4 (four) times daily. X 7 days   28 capsule   0     BP 105/66  Pulse 77  Temp 97.5 F (36.4 C) (Oral)  Resp 18  SpO2 96%  Physical Exam  Nursing note and vitals reviewed. Constitutional: He is oriented to person, place, and time. He appears well-developed and  well-nourished.  HENT:  Head: Normocephalic.  Mouth/Throat: Oropharynx is clear and moist.  Eyes: Conjunctivae normal are normal. Pupils are equal, round, and reactive to light.  Neck: Normal range of motion.  Cardiovascular: Normal rate.   Pulmonary/Chest: Effort normal.  Musculoskeletal:       R thumb small paronychia on ulna aspect. No felon. Nl cap refill. Nl neurovascular exam.   Neurological: He is alert and oriented to person, place, and time.  Skin: Skin is warm and dry.  Psychiatric: He has a normal mood and affect. His behavior is normal. Judgment and thought content normal.    ED Course  Procedures DIAGNOSTIC STUDIES: Oxygen Saturation is 96% on room air, normal by my interpretation.    COORDINATION OF CARE: 14:52- Evaluated Pt. Pt is awake, alert, and without distress. 14:57 - Patient understand and agree with initial ED impression and plan with expectations set for ED visit.    INCISION AND DRAINAGE Performed by: Dr. Richardean Canal Consent: Verbal consent obtained. Risks and benefits: risks, benefits and alternatives were discussed Type: abscess  Body area: L paronychia  Anesthesia: local infiltration  Incision was made with a scalpel.  Local anesthetic: none  Anesthetic total: none  Complexity: complex Blunt dissection to break up loculations  Drainage: purulent  Drainage amount: 2 cc  Packing material: none  Patient tolerance: Patient tolerated the procedure well with no immediate complications.  Comments: Blunt dissection used to open up paronychia. No complications.        Labs Reviewed - No data to display No results found.   1. Paronychia of left thumb       MDM  Antonio Hunnell. is a 61 y.o. male here with L paronychia. Paronychia was I&D. Given hx of MRSA and that he has heart valve, will give clinda to treat MRSA. Return precautions given.  I personally performed the  services described in this documentation, which was scribed  in my presence. The recorded information has been reviewed and is accurate.   Richardean Canal, MD 10/08/12 [phone]

## 2012-10-15 ENCOUNTER — Encounter: Payer: Self-pay | Admitting: Cardiovascular Disease

## 2012-10-15 ENCOUNTER — Ambulatory Visit (INDEPENDENT_AMBULATORY_CARE_PROVIDER_SITE_OTHER): Payer: BC Managed Care – PPO | Admitting: Cardiovascular Disease

## 2012-10-15 VITALS — BP 124/72 | Ht 72.0 in | Wt 343.0 lb

## 2012-10-15 DIAGNOSIS — I5032 Chronic diastolic (congestive) heart failure: Secondary | ICD-10-CM

## 2012-10-15 DIAGNOSIS — I359 Nonrheumatic aortic valve disorder, unspecified: Secondary | ICD-10-CM

## 2012-10-15 DIAGNOSIS — I509 Heart failure, unspecified: Secondary | ICD-10-CM

## 2012-10-15 DIAGNOSIS — I350 Nonrheumatic aortic (valve) stenosis: Secondary | ICD-10-CM

## 2012-10-15 NOTE — Progress Notes (Signed)
History of Present Illness: 61 yo WM with a history of severe aortic stenosis s/p AVR with bioprosthetic aortic valve in January 2013, obstructive sleep apnea, DM, HTN, gout, hyperlipidemia, diastolic heart failure and non-ischemic cardiomyopathy who is here today for cardiac follow up. He was known to have severe aortic valve stenosis in 2012 and developed massive volume overload in January 2013. He was diuresed and underwent Aortic valve replacement surgery per Dr. Donata Clay on 11/29/2011. (Aortic valve replacement with a 27-mm pericardial [facility] Ease valve (model 3300TFX, serial number [id number]).) He did well following surgery but required several more weeks of hospitalization for diuresis following surgery. He did have a short run of atrial fibrillation and was started on amiodarone. His history includes left heart catheterization done December 2011 demonstrating normal coronary arteries.   He is here today for follow up. No chest pain or SOB. No signs of heart failure. He is feeling well.LE edema resolved. He broke his right foot after a fall this spring.  He is going through a divorce. He does c/o ED. He has no partners now but this concerns him.    Primary Care Physician: Loyal Jacobson  Last Lipid Profile:Lipid Panel     Component Value Date/Time   CHOL 112 11/27/2011 0520   TRIG 161* 11/27/2011 0520   HDL 26* 11/27/2011 0520   CHOLHDL 4.3 11/27/2011 0520   VLDL 32 11/27/2011 0520   LDLCALC 54 11/27/2011 0520     Past Medical History  Diagnosis Date  . Hepatitis   . Chronic systolic heart failure     echo 12/11: EF 45-50%, grade 1 diast dysfxn, moderately severe AS (AVA 0.9, mean 31 mmHg), mild MR;    cath 1/12:  normal cors, AVA 1.0, peak to peak gradiet for AV 31 mmHg, mod-severe AS, EF 35-40%  . 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 Hx MRSA infection     thigh abcess 11/06  . Anxiety   . Aortic stenosis     AVR 11/29/11  . HTN (hypertension)   . DM2 (diabetes  mellitus, type 2)   . HLD (hyperlipidemia)   . Gout   . NICM (nonischemic cardiomyopathy)   . Chronic diastolic heart failure     Past Surgical History  Procedure Date  . Laminectomy 10/30/83  . Aortic valve replacement 11/29/2011    Procedure: AORTIC VALVE REPLACEMENT (AVR);  Surgeon: [name], MD;  Location: [facility] OR;  Service: Open Heart Surgery;  Laterality: N/A;  with nitric oxide    Current Outpatient Prescriptions  Medication Sig Dispense Refill  . allopurinol (ZYLOPRIM) 100 MG tablet Take 100 mg by mouth daily.      Marland Kitchen amoxicillin (AMOXIL) 500 MG capsule Take 4 tablets by mouth 60 minutes prior to dental procedure  4 capsule  2  . aspirin 81 MG tablet Take 81 mg by mouth daily.        Marland Kitchen atorvastatin (LIPITOR) 40 MG tablet Take 1 tablet (40 mg total) by mouth daily.  90 tablet  3  . cefadroxil (DURICEF) 500 MG capsule Take 500 mg by mouth 2 (two) times daily. Hand infection.      . clindamycin (CLEOCIN) 300 MG capsule Take 1 capsule (300 mg total) by mouth 4 (four) times daily. X 7 days  28 capsule  0  . colchicine 0.6 MG tablet Take 0.6 mg by mouth daily. gout      . fenofibrate (TRICOR) 145 MG  tablet Take 145 mg by mouth daily.      . furosemide (LASIX) 80 MG tablet Take 80-160 mg by mouth 2 (two) times daily. Take 2 tabs in am and one tab in the pm      . HYDROcodone-acetaminophen (LORTAB) 10-500 MG per tablet Take 1 tablet by mouth every 6 (six) hours as needed. pain      . insulin glargine (LANTUS) 100 UNIT/ML injection Inject 10 Units into the skin at bedtime. As directed      . terbinafine (LAMISIL) 250 MG tablet Take 250 mg by mouth daily.        No Known Allergies  History   Social History  . Marital Status: Married    Spouse Name: N/A    Number of Children: N/A  . Years of Education: N/A   Occupational History  . retired travelling Engineer, site    Social History Main Topics  . Smoking status: Never Smoker   . Smokeless tobacco: Not on file  .  Alcohol Use: Not on file  . Drug Use: Not on file  . Sexually Active: Not on file   Other Topics Concern  . Not on file   Social History Narrative  . No narrative on file    Family History  Problem Relation Age of Onset  . Heart attack Father     Review of Systems:  As stated in the HPI and otherwise negative.   BP 124/72  Ht 6' (1.829 m)  Wt 343 lb (155.584 kg)  BMI 46.52 kg/m2  Physical Examination: General: Well developed, well nourished, NAD HEENT: OP clear, mucus membranes moist SKIN: warm, dry. No rashes. Neuro: No focal deficits Musculoskeletal: Muscle strength 5/5 all ext Psychiatric: Mood and affect normal Neck: No JVD, no carotid bruits, no thyromegaly, no lymphadenopathy. Lungs:Clear bilaterally, no wheezes, rhonci, crackles Cardiovascular: Regular rate and rhythm. No murmurs, gallops or rubs. Abdomen:Soft. Bowel sounds present. Non-tender.  Extremities: Trace lower extremity edema. Chronic venous stasis changes. Pulses are 2 + in the bilateral DP/PT.  Assessment and Plan:   1. AORTIC STENOSIS s/p AVR: S/p AVR with bioprosthetic aortic valve. Doing well. Will repeat echo this year.   2. Chronic diastolic heart failure: Volume status is ok today. Continue current dose of Lasix.

## 2012-10-15 NOTE — Patient Instructions (Signed)

## 2012-10-23 ENCOUNTER — Ambulatory Visit (HOSPITAL_COMMUNITY): Payer: BC Managed Care – PPO | Attending: Cardiovascular Disease

## 2012-10-23 DIAGNOSIS — I10 Essential (primary) hypertension: Secondary | ICD-10-CM | POA: Insufficient documentation

## 2012-10-23 DIAGNOSIS — I359 Nonrheumatic aortic valve disorder, unspecified: Secondary | ICD-10-CM

## 2012-10-23 DIAGNOSIS — I350 Nonrheumatic aortic (valve) stenosis: Secondary | ICD-10-CM

## 2012-10-23 DIAGNOSIS — I509 Heart failure, unspecified: Secondary | ICD-10-CM | POA: Insufficient documentation

## 2012-10-23 DIAGNOSIS — I369 Nonrheumatic tricuspid valve disorder, unspecified: Secondary | ICD-10-CM | POA: Insufficient documentation

## 2012-10-23 DIAGNOSIS — E119 Type 2 diabetes mellitus without complications: Secondary | ICD-10-CM | POA: Insufficient documentation

## 2012-10-23 DIAGNOSIS — I379 Nonrheumatic pulmonary valve disorder, unspecified: Secondary | ICD-10-CM | POA: Insufficient documentation

## 2012-10-23 DIAGNOSIS — I059 Rheumatic mitral valve disease, unspecified: Secondary | ICD-10-CM | POA: Insufficient documentation

## 2012-10-23 NOTE — Progress Notes (Signed)
Echocardiogram performed.  

## 2012-11-05 ENCOUNTER — Other Ambulatory Visit (HOSPITAL_COMMUNITY): Payer: BC Managed Care – PPO

## 2012-11-05 ENCOUNTER — Telehealth: Payer: Self-pay | Admitting: Cardiovascular Disease

## 2012-11-05 NOTE — Telephone Encounter (Signed)
Pt rtn your call

## 2012-11-05 NOTE — Telephone Encounter (Signed)
Spoke with pt and reviewed echo results. He would like me to check with Dr. Clifton James regarding LVH.  Pt wants to make sure this has not worsened. He also wants to let Dr. Clifton James know he had vascular studies done on legs recently which showed normal blood flow to his feet and that foot issues seem to be related to his diabetes. Pt would like to know if social security disability forms have been sent in.  Will forward to Dr. Clifton James and check with medical records regarding disability paperwork.

## 2012-11-07 NOTE — Telephone Encounter (Signed)
LVH is stable. chris

## 2012-11-10 NOTE — Telephone Encounter (Signed)
I confirmed with medical records that information had been sent to Disability Determination Services in September. I spoke with pt and gave him this information and also information from Dr. Clifton James. Pt will have vascular studies done by Dr. Sherilyn Cooter in [facility] sent to [place].

## 2013-02-24 ENCOUNTER — Telehealth: Payer: Self-pay | Admitting: Cardiovascular Disease

## 2013-02-24 NOTE — Telephone Encounter (Signed)
Spoke with Angelique Blonder at [facility]. She reports pt was in office today with complaints of congestion and shortness of breath. Chest X-ray showed changes from previous X-ray. Pt's weight also up 10 lbs since end of March. Pt was started on antibiotic today. They will fax chest X-ray report to [place]. I will contact pt to schedule appt to see Dr. Clifton James this week.

## 2013-02-24 NOTE — Telephone Encounter (Signed)
New Problem:    Called in wanting the patient to be seen this week because an x-ray that they performed showed that the patient had an enlarged heart.  Please call back.

## 2013-02-25 ENCOUNTER — Ambulatory Visit (INDEPENDENT_AMBULATORY_CARE_PROVIDER_SITE_OTHER): Payer: BC Managed Care – PPO | Admitting: Physician Assistant

## 2013-02-25 ENCOUNTER — Ambulatory Visit: Payer: BC Managed Care – PPO | Admitting: Physician Assistant

## 2013-02-25 ENCOUNTER — Encounter: Payer: Self-pay | Admitting: Physician Assistant

## 2013-02-25 VITALS — BP 140/72 | HR 85 | Ht 72.0 in | Wt 351.4 lb

## 2013-02-25 DIAGNOSIS — I359 Nonrheumatic aortic valve disorder, unspecified: Secondary | ICD-10-CM

## 2013-02-25 DIAGNOSIS — I10 Essential (primary) hypertension: Secondary | ICD-10-CM

## 2013-02-25 DIAGNOSIS — I5033 Acute on chronic diastolic (congestive) heart failure: Secondary | ICD-10-CM

## 2013-02-25 DIAGNOSIS — E785 Hyperlipidemia, unspecified: Secondary | ICD-10-CM

## 2013-02-25 MED ORDER — FUROSEMIDE 80 MG PO TABS: ORAL_TABLET | ORAL | Status: DC

## 2013-02-25 NOTE — Patient Instructions (Addendum)
You have been referred to Please refer pt to est with new PCP in the [place] office   CONTINUE LASIX 160 MG IN THE AM AND 80 MG IN THE EVENING  LABS TODAY; BMET, BNP  REPEAT BMET 03/02/13  PLEASE FOLLOW UP WITH SCOTT WEAVER, PAC IN 2 WEEKS

## 2013-02-25 NOTE — Telephone Encounter (Signed)
Agree. cdm 

## 2013-02-25 NOTE — Progress Notes (Signed)
[address] [place], [place]  [address] Phone: [phone] Fax:  [phone]  Date:  02/25/2013   ID:  Antonio Smothers., DOB [birth year], MRN [id number]  PCP:  [name], MD  Primary Cardiologist:  Dr. Verne Carrow     History of Present Illness: Antonio Riebel. is a 62 y.o. male who returns for evaluation of dyspnea and edema.  He has a hx of severe AS, s/p bioprosthetic AVR 10/2011, NICM (previous EF 35-40% in 2012 recovering to normal by last echo), diastolic CHF, DM2, HTN, HL, OSA, gout. Echo 09/2012: mod LVH, EF 55-65%, Gr 1 DD, AVR ok, mild LAE.  He had known severe AS and then developed massive volume overload in 10/2011.  He was diuresed and underwent AVR with Dr. Donata Clay.  He then had to remain in the hospital for several more weeks of diuresis.  He did have post op AFib requiring amiodarone.  Last seen by Dr. Verne Carrow 09/2012.   Patient notes a 17 pound weight gain in the last month. He does admit to drinking extra fluids. He developed a cough about a week ago productive of green sputum.  He just saw his PCP yesterday who started him on a Z-Pak. He increased his Lasix on his own to 160 mg in the morning and 80 mg in the evening (previous dose). He has started to diurese and does feel better. He denies chest pain. He denies syncope. He sleeps on 2-3 pillows without change. He denies PND. He does note increased LE edema. He has noted increased dyspnea with exertion. He is likely NYHA class IIb-III. He does have decreased mobility due to recent exacerbation of low back pain.  Labs (3/13):  K 4, Cr 1.5, Hgb 9.8  CXR 02/24/13:  ATX vs scarring, infiltrate possible but felt less likely  Wt Readings from Last 3 Encounters:  02/25/13 351 lb 6.4 oz (159.394 kg)  10/15/12 343 lb (155.584 kg)  02/19/12 325 lb (147.419 kg)     Past Medical History  Diagnosis Date  . Hepatitis   . NICM (nonischemic cardiomyopathy)     echo 12/11: EF  45-50%, grade 1 diast dysfxn, moderately severe AS (AVA 0.9, mean 31 mmHg), mild MR;    cath 1/12:  normal cors, AVA 1.0, peak to peak gradiet for AV 31 mmHg, mod-severe AS, EF 35-40%  . 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 Hx MRSA infection     thigh abcess 11/06  . Anxiety   . Aortic stenosis     AVR 11/29/11  . HTN (hypertension)   . DM2 (diabetes mellitus, type 2)   . HLD (hyperlipidemia)   . Gout   . Chronic diastolic heart failure     Echo 09/2012: mod LVH, EF 55-65%, Gr 1 DD, AVR ok, mild LAE.    Current Outpatient Prescriptions  Medication Sig Dispense Refill  . amoxicillin (AMOXIL) 500 MG capsule Take 4 tablets by mouth 60 minutes prior to dental procedure  4 capsule  2  . atorvastatin (LIPITOR) 40 MG tablet Take 1 tablet (40 mg total) by mouth daily.  90 tablet  3  . cefadroxil (DURICEF) 500 MG capsule Take 500 mg by mouth 2 (two) times daily. Hand infection.      . clindamycin (CLEOCIN) 300 MG capsule Take 1 capsule (300 mg total) by mouth 4 (four) times daily. X 7 days  28 capsule  0  .  colchicine 0.6 MG tablet Take 0.6 mg by mouth daily. gout      . fenofibrate (TRICOR) 145 MG tablet Take 145 mg by mouth daily.      . furosemide (LASIX) 80 MG tablet Take 80-160 mg by mouth 2 (two) times daily. Take 2 tabs in am and one tab in the pm      . HYDROcodone-acetaminophen (LORTAB) 10-500 MG per tablet Take 1 tablet by mouth every 6 (six) hours as needed. pain      . insulin glargine (LANTUS) 100 UNIT/ML injection Inject 10 Units into the skin at bedtime. As directed      . terbinafine (LAMISIL) 250 MG tablet Take 250 mg by mouth daily.       No current facility-administered medications for this visit.    Allergies:   No Known Allergies  Social History:  The patient  reports that he has never smoked. He does not have any smokeless tobacco history on file.   ROS:  Please see the history of present illness.   He notes constipation.   All other systems reviewed and  negative.   PHYSICAL EXAM: VS:  BP 140/72  Pulse 85  Ht 6' (1.829 m)  Wt 351 lb 6.4 oz (159.394 kg)  BMI 47.65 kg/m2 Well nourished, well developed, in no acute distress HEENT: normal Neck: no JVD at 90 Cardiac:  normal S1, S2; RRR; 2/6 systolic murmur at the RUSB Lungs:  clear to auscultation bilaterally, no wheezing, rhonchi or rales Abd: soft, nontender, no hepatomegaly Ext: 1+ bilateral LE edema Skin: warm and dry Neuro:  CNs 2-12 intact, no focal abnormalities noted  EKG:  NSR, HR 85, RBBB     ASSESSMENT AND PLAN:  1. Acute on Chronic Diastolic CHF:  Patient has had significant weight gain at home as well as increased dyspnea and increased LE edema. He has had difficulty with diastolic CHF in the past. He has already increased his furosemide on his own with improving diuresis. I have recommended that he continue his current dose. We will check a basic metabolic panel and BNP today. Repeat a basic metabolic panel in 5 days. 2. Aortic Stenosis, status post AVR: Well-functioning valve by recent echocardiogram. 3. Hypertension: Fair control. Continue current therapy. 4. Hyperlipidemia: Continue statin. 5. Disposition: Follow up with me in 2 weeks.  Luna Glasgow, PA-C  3:42 PM 02/25/2013

## 2013-02-25 NOTE — Telephone Encounter (Signed)
Spoke with pt. He will come in to see [name], PA today at 3:20

## 2013-02-26 ENCOUNTER — Other Ambulatory Visit: Payer: Self-pay | Admitting: *Deleted

## 2013-02-26 DIAGNOSIS — I10 Essential (primary) hypertension: Secondary | ICD-10-CM

## 2013-02-26 LAB — BASIC METABOLIC PANEL
CO2: 27 mEq/L (ref 19–32)
Chloride: 101 mEq/L (ref 96–112)
Creatinine, Ser: 1.2 mg/dL (ref 0.4–1.5)
Potassium: 4 mEq/L (ref 3.5–5.1)
Sodium: 136 mEq/L (ref 135–145)

## 2013-02-26 LAB — BRAIN NATRIURETIC PEPTIDE: Pro B Natriuretic peptide (BNP): 42 pg/mL (ref 0.0–100.0)

## 2013-02-26 MED ORDER — FUROSEMIDE 80 MG PO TABS: ORAL_TABLET | ORAL | Status: DC

## 2013-02-26 NOTE — Telephone Encounter (Signed)
S/w pt will stay on increased dose of lasix (160 mg) in the am ( 80 mg) in the pm than in one week go back to regular dosage ( 80 mg ) bid come in on 5/5 for repeat lab work and keep f/u with [name], PA-C

## 2013-03-02 ENCOUNTER — Other Ambulatory Visit: Payer: BC Managed Care – PPO

## 2013-03-03 ENCOUNTER — Telehealth: Payer: Self-pay | Admitting: Cardiovascular Disease

## 2013-03-03 ENCOUNTER — Other Ambulatory Visit: Payer: BC Managed Care – PPO

## 2013-03-03 NOTE — Telephone Encounter (Signed)
Spoke with pt. He was to have blood drawn yesterday (BMP) but was unable to be here because he took pain medicine for his back and was unable to drive. He will come in tomorrow for BMP.

## 2013-03-03 NOTE — Telephone Encounter (Signed)
New problem   Pt wants to know if he needs labs before appt on 03/11/13-w/s.weaver

## 2013-03-04 ENCOUNTER — Other Ambulatory Visit (INDEPENDENT_AMBULATORY_CARE_PROVIDER_SITE_OTHER): Payer: BC Managed Care – PPO

## 2013-03-04 DIAGNOSIS — I5033 Acute on chronic diastolic (congestive) heart failure: Secondary | ICD-10-CM

## 2013-03-04 DIAGNOSIS — I10 Essential (primary) hypertension: Secondary | ICD-10-CM

## 2013-03-05 LAB — BASIC METABOLIC PANEL
BUN: 42 mg/dL — ABNORMAL HIGH (ref 6–23)
Creatinine, Ser: 1.5 mg/dL (ref 0.4–1.5)
GFR: 49.57 mL/min — ABNORMAL LOW (ref >60.00)
Potassium: 4.3 mEq/L (ref 3.5–5.1)

## 2013-03-06 ENCOUNTER — Telehealth: Payer: Self-pay | Admitting: *Deleted

## 2013-03-06 NOTE — Telephone Encounter (Signed)
I called pt to clarify his lasix dose. Also pt has appt with [name], PA on Mar 11, 2013 but provider will not be in office. Dr. Clifton James can see pt on Mar 11, 2013 at 1:30. I left message for pt to call back

## 2013-03-06 NOTE — Telephone Encounter (Signed)
Left message to call back. See BMP results also

## 2013-03-09 NOTE — Telephone Encounter (Signed)
Returned call to patient no answer.LMTC. 

## 2013-03-09 NOTE — Telephone Encounter (Signed)
Follow up    Patient states someone just called regarding test results

## 2013-03-09 NOTE — Telephone Encounter (Signed)
LMTCB

## 2013-03-11 ENCOUNTER — Ambulatory Visit: Payer: BC Managed Care – PPO | Admitting: Physician Assistant

## 2013-03-11 ENCOUNTER — Encounter: Payer: Self-pay | Admitting: Cardiovascular Disease

## 2013-03-11 ENCOUNTER — Ambulatory Visit (INDEPENDENT_AMBULATORY_CARE_PROVIDER_SITE_OTHER): Payer: BC Managed Care – PPO | Admitting: Cardiovascular Disease

## 2013-03-11 ENCOUNTER — Telehealth: Payer: Self-pay

## 2013-03-11 VITALS — BP 116/64 | HR 88 | Ht 72.0 in | Wt 351.8 lb

## 2013-03-11 DIAGNOSIS — I359 Nonrheumatic aortic valve disorder, unspecified: Secondary | ICD-10-CM

## 2013-03-11 DIAGNOSIS — I5032 Chronic diastolic (congestive) heart failure: Secondary | ICD-10-CM

## 2013-03-11 MED ORDER — LISINOPRIL 20 MG PO TABS
20.0000 mg | ORAL_TABLET | Freq: Every day | ORAL | Status: DC
Start: 2013-03-11 — End: 2013-07-25

## 2013-03-11 NOTE — Patient Instructions (Addendum)
Your physician recommends that you schedule a follow-up appointment in:3-4 months.  Your physician recommends that you return for lab work in:  10-14 days--BMP  Your physician has recommended you make the following change in your medication:  Stop Lotrel. Start lisinopril 20 mg by mouth daily

## 2013-03-11 NOTE — Progress Notes (Signed)
History of Present Illness: 62 yo WM with a history of severe aortic stenosis s/p AVR with bioprosthetic aortic valve in January 2013, obstructive sleep apnea, DM, HTN, gout, hyperlipidemia, chronic combined systolic and diastolic heart failure and non-ischemic cardiomyopathy who is here today for cardiac follow up. He was known to have severe aortic valve stenosis in 2012 and developed massive volume overload in January 2013. He was diuresed and underwent Aortic valve replacement surgery per Dr. Donata Clay on 11/29/2011. (Aortic valve replacement with a 27-mm pericardial [facility] Ease valve (model 3300TFX, serial number [id number]).) He did well following surgery but required several more weeks of hospitalization for diuresis following surgery. He did have a short run of atrial fibrillation and was started on amiodarone. His history includes left heart catheterization done December 2011 demonstrating normal coronary arteries. He was seen 02/25/13 by [name], PA-C for weight gain. He noted a 17 pound weight gain over one month. He did admit to drinking extra fluids. He developed a cough about a week prior productive of green sputum. He had seen his PCP the day before and was started on a Z-Pak. He increased his Lasix on his own to 160 mg in the morning and 80 mg in the evening (previous dose). He had started to diurese when seen in our clinic.  He is here today for follow up. He is feeling well. His cough resolved. No sinus issues. Breathing is normal. No chest pain. Still taking Lasix 160 mg po BID, 80 mg po BID. Weight is unchanged but he has been diuresing well. He was started on amlodipine/benazepril combo pill and LE edema has been worse since starting that pill.   Primary Care Physician: Loyal Jacobson  Last Lipid Profile:Lipid Panel     Component Value Date/Time   CHOL 112 11/27/2011 0520   TRIG 161* 11/27/2011 0520   HDL 26* 11/27/2011 0520   CHOLHDL 4.3 11/27/2011 0520   VLDL 32 11/27/2011  0520   LDLCALC 54 11/27/2011 0520     Past Medical History  Diagnosis Date  . Hepatitis   . NICM (nonischemic cardiomyopathy)     echo 12/11: EF 45-50%, grade 1 diast dysfxn, moderately severe AS (AVA 0.9, mean 31 mmHg), mild MR;    cath 1/12:  normal cors, AVA 1.0, peak to peak gradiet for AV 31 mmHg, mod-severe AS, EF 35-40%  . 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 Hx MRSA infection     thigh abcess 11/06  . Anxiety   . Aortic stenosis     AVR 11/29/11  . HTN (hypertension)   . DM2 (diabetes mellitus, type 2)   . HLD (hyperlipidemia)   . Gout   . Chronic diastolic heart failure     Echo 09/2012: mod LVH, EF 55-65%, Gr 1 DD, AVR ok, mild LAE.    Past Surgical History  Procedure Laterality Date  . Laminectomy  10/30/83  . Aortic valve replacement  11/29/2011    Procedure: AORTIC VALVE REPLACEMENT (AVR);  Surgeon: [name], MD;  Location: [facility] OR;  Service: Open Heart Surgery;  Laterality: N/A;  with nitric oxide    Current Outpatient Prescriptions  Medication Sig Dispense Refill  . amLODipine-benazepril (LOTREL) 5-20 MG per capsule Take 1 capsule by mouth daily.      Marland Kitchen amoxicillin (AMOXIL) 500 MG capsule Take 4 tablets by mouth 60 minutes prior to dental procedure  4 capsule  2  . aspirin 325  MG tablet Take 325 mg by mouth daily.      Marland Kitchen atorvastatin (LIPITOR) 40 MG tablet Take 1 tablet (40 mg total) by mouth daily.  90 tablet  3  . carisoprodol (SOMA) 350 MG tablet Take 350 mg by mouth as needed for muscle spasms.      . carvedilol (COREG) 6.25 MG tablet Take 6.25 mg by mouth 2 (two) times daily with a meal.      . colchicine 0.6 MG tablet Take 0.6 mg by mouth 2 (two) times daily. gout      . cyclobenzaprine (FLEXERIL) 10 MG tablet Take 10 mg by mouth 3 (three) times daily as needed for muscle spasms.      Marland Kitchen escitalopram (LEXAPRO) 10 MG tablet Take 10 mg by mouth daily.      Marland Kitchen ezetimibe (ZETIA) 10 MG tablet Take 10 mg by mouth daily.      . febuxostat  (ULORIC) 40 MG tablet Take 40 mg by mouth daily.      . fenofibrate (TRICOR) 145 MG tablet Take 145 mg by mouth daily.      . furosemide (LASIX) 80 MG tablet Take 2 tabs in am and one tab in the pm for one week than go back to normal dosage  60 tablet  6  . glimepiride (AMARYL) 2 MG tablet Take 2 mg by mouth daily before breakfast.      . insulin glargine (LANTUS) 100 UNIT/ML injection Inject 10 Units into the skin at bedtime. As directed      . terbinafine (LAMISIL) 250 MG tablet Take 250 mg by mouth daily.      Marland Kitchen allopurinol (ZYLOPRIM) 100 MG tablet Take 100 mg by mouth as needed.       No current facility-administered medications for this visit.    No Known Allergies  History   Social History  . Marital Status: Married    Spouse Name: N/A    Number of Children: N/A  . Years of Education: N/A   Occupational History  . retired travelling Engineer, site    Social History Main Topics  . Smoking status: Never Smoker   . Smokeless tobacco: Not on file  . Alcohol Use: Not on file  . Drug Use: Not on file  . Sexually Active: Not on file   Other Topics Concern  . Not on file   Social History Narrative  . No narrative on file    Family History  Problem Relation Age of Onset  . Heart attack Father     Review of Systems:  As stated in the HPI and otherwise negative.   BP 116/64  Pulse 88  Ht 6' (1.829 m)  Wt 351 lb 12.8 oz (159.575 kg)  BMI 47.7 kg/m2  Physical Examination: General: Well developed, well nourished, NAD HEENT: OP clear, mucus membranes moist SKIN: warm, dry. No rashes. Neuro: No focal deficits Musculoskeletal: Muscle strength 5/5 all ext Psychiatric: Mood and affect normal Neck: No JVD, no carotid bruits, no thyromegaly, no lymphadenopathy. Lungs:Clear bilaterally, no wheezes, rhonci, crackles Cardiovascular: Regular rate and rhythm. No murmurs, gallops or rubs. Abdomen:Soft. Bowel sounds present. Non-tender.  Extremities: Trace to 1+ bilateral  lower extremity edema. Pulses are 2 + in the bilateral DP/PT.  EKG: NSR, RBBB, LPFB. Rate 88 bpm.   Assessment and Plan:   1. Acute on Chronic Diastolic CHF:  Weight is still up but stable. He feels well. He is urinating frequently. Will continue Lasix 160 mg po in the  am and 80 mg po in the pm. Repeat BMET 10 days. Stop Lotrel (amlodipine/benazepril combo) and start Lisinopril 20 mg po Qdaily.   2. Aortic stenosis, status post AVR: Well-functioning valve by recent echocardiogram.  3. HTN: Controlled. Continue current therapy  4. Hyperlipidemia: Continue statin.

## 2013-03-11 NOTE — Telephone Encounter (Signed)
Pt saw Dr. Clifton James on 03/11/13

## 2013-03-11 NOTE — Telephone Encounter (Signed)
Lupita Leash from [facility] called to confirm dosage of pt lisinopril sent over today. per Dr. Clifton James office note dated 03/11/13 pt is to start 20mg  of lisinopril.confirmed with pharmacy

## 2013-03-26 ENCOUNTER — Other Ambulatory Visit: Payer: Self-pay | Admitting: Neurosurgery

## 2013-03-26 DIAGNOSIS — M47816 Spondylosis without myelopathy or radiculopathy, lumbar region: Secondary | ICD-10-CM

## 2013-03-30 ENCOUNTER — Other Ambulatory Visit: Payer: BC Managed Care – PPO

## 2013-04-01 ENCOUNTER — Ambulatory Visit
Admission: RE | Admit: 2013-04-01 | Discharge: 2013-04-01 | Disposition: A | Discharge status: Home / Self Care | Payer: BC Managed Care – PPO | Source: Ambulatory Visit | Attending: Neurosurgery | Admitting: Neurosurgery

## 2013-04-01 DIAGNOSIS — M47816 Spondylosis without myelopathy or radiculopathy, lumbar region: Secondary | ICD-10-CM

## 2013-04-01 MED ORDER — GADOBENATE DIMEGLUMINE 529 MG/ML IV SOLN
10.0000 mL | Freq: Once | INTRAVENOUS | Status: AC | PRN
  Administered 2013-04-01: 10 mL via INTRAVENOUS

## 2013-07-13 ENCOUNTER — Encounter: Payer: Self-pay | Admitting: Cardiovascular Disease

## 2013-07-13 ENCOUNTER — Ambulatory Visit (INDEPENDENT_AMBULATORY_CARE_PROVIDER_SITE_OTHER): Payer: BC Managed Care – PPO | Admitting: Cardiovascular Disease

## 2013-07-13 VITALS — BP 178/78 | HR 86 | Ht 73.0 in | Wt 335.0 lb

## 2013-07-13 DIAGNOSIS — I359 Nonrheumatic aortic valve disorder, unspecified: Secondary | ICD-10-CM

## 2013-07-13 DIAGNOSIS — I10 Essential (primary) hypertension: Secondary | ICD-10-CM

## 2013-07-13 DIAGNOSIS — I350 Nonrheumatic aortic (valve) stenosis: Secondary | ICD-10-CM

## 2013-07-13 DIAGNOSIS — I5033 Acute on chronic diastolic (congestive) heart failure: Secondary | ICD-10-CM

## 2013-07-13 MED ORDER — FUROSEMIDE 80 MG PO TABS: 80.0000 mg | ORAL_TABLET | Freq: Two times a day (BID) | ORAL | Status: DC

## 2013-07-13 NOTE — Progress Notes (Signed)
History of Present Illness: 62 yo WM with a history of severe aortic stenosis s/p AVR with bioprosthetic aortic valve in January 2013, obstructive sleep apnea, DM, HTN, gout, hyperlipidemia, chronic combined systolic and diastolic heart failure and non-ischemic cardiomyopathy who is here today for cardiac follow up. He was known to have severe aortic valve stenosis in 2012 and developed massive volume overload in January 2013. He was diuresed and underwent Aortic valve replacement surgery per Dr. Donata Clay on 11/29/2011. (Aortic valve replacement with a 27-mm pericardial [facility] Ease valve (model 3300TFX, serial number [id number]).) He did well following surgery but required several more weeks of hospitalization for diuresis following surgery. He did have a short run of atrial fibrillation and was started on amiodarone. His history includes left heart catheterization done December 2011 demonstrating normal coronary arteries.   He is here today for follow up. He is feeling well. Breathing is normal. No chest pain. His weight had been down but recently he has been non-compliant with his diet and has gained a few lbs.   Primary Care Physician: Loyal Jacobson  Last Lipid Profile:Lipid Panel     Component Value Date/Time   CHOL 112 11/27/2011 0520   TRIG 161* 11/27/2011 0520   HDL 26* 11/27/2011 0520   CHOLHDL 4.3 11/27/2011 0520   VLDL 32 11/27/2011 0520   LDLCALC 54 11/27/2011 0520    Past Medical History  Diagnosis Date  . Hepatitis   . NICM (nonischemic cardiomyopathy)     echo 12/11: EF 45-50%, grade 1 diast dysfxn, moderately severe AS (AVA 0.9, mean 31 mmHg), mild MR;    cath 1/12:  normal cors, AVA 1.0, peak to peak gradiet for AV 31 mmHg, mod-severe AS, EF 35-40%  . 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 Hx MRSA infection     thigh abcess 11/06  . Anxiety   . Aortic stenosis     AVR 11/29/11  . HTN (hypertension)   . DM2 (diabetes mellitus, type 2)   . HLD (hyperlipidemia)    . Gout   . Chronic diastolic heart failure     Echo 09/2012: mod LVH, EF 55-65%, Gr 1 DD, AVR ok, mild LAE.    Past Surgical History  Procedure Laterality Date  . Laminectomy  10/30/83  . Aortic valve replacement  11/29/2011    Procedure: AORTIC VALVE REPLACEMENT (AVR);  Surgeon: [name], MD;  Location: [facility] OR;  Service: Open Heart Surgery;  Laterality: N/A;  with nitric oxide    Current Outpatient Prescriptions  Medication Sig Dispense Refill  . allopurinol (ZYLOPRIM) 100 MG tablet Take 100 mg by mouth as needed.      Marland Kitchen amoxicillin (AMOXIL) 500 MG capsule Take 4 tablets by mouth 60 minutes prior to dental procedure  4 capsule  2  . aspirin 325 MG tablet Take 325 mg by mouth daily.      Marland Kitchen atorvastatin (LIPITOR) 40 MG tablet Take 1 tablet (40 mg total) by mouth daily.  90 tablet  3  . carisoprodol (SOMA) 350 MG tablet Take 350 mg by mouth as needed for muscle spasms.      . carvedilol (COREG) 6.25 MG tablet Take 6.25 mg by mouth 2 (two) times daily with a meal.      . colchicine 0.6 MG tablet Take 0.6 mg by mouth 2 (two) times daily. gout      . cyclobenzaprine (FLEXERIL) 10 MG tablet Take 10 mg by  mouth 3 (three) times daily as needed for muscle spasms.      Marland Kitchen escitalopram (LEXAPRO) 10 MG tablet Take 10 mg by mouth daily.      Marland Kitchen ezetimibe (ZETIA) 10 MG tablet Take 10 mg by mouth daily.      . febuxostat (ULORIC) 40 MG tablet Take 40 mg by mouth daily.      . fenofibrate (TRICOR) 145 MG tablet Take 145 mg by mouth daily.      . furosemide (LASIX) 80 MG tablet Take 2 tabs in am and one tab in the pm for one week than go back to normal dosage  60 tablet  6  . glimepiride (AMARYL) 2 MG tablet Take 2 mg by mouth daily before breakfast.      . insulin glargine (LANTUS) 100 UNIT/ML injection Inject 10 Units into the skin at bedtime. As directed      . lisinopril (PRINIVIL,ZESTRIL) 20 MG tablet Take 1 tablet (20 mg total) by mouth daily.  30 tablet  11  . terbinafine (LAMISIL) 250 MG  tablet Take 250 mg by mouth daily.       No current facility-administered medications for this visit.    No Known Allergies  History   Social History  . Marital Status: Legally Separated    Spouse Name: N/A    Number of Children: N/A  . Years of Education: N/A   Occupational History  . retired travelling Engineer, site    Social History Main Topics  . Smoking status: Never Smoker   . Smokeless tobacco: Not on file  . Alcohol Use: Not on file  . Drug Use: Not on file  . Sexual Activity: Not on file   Other Topics Concern  . Not on file   Social History Narrative  . No narrative on file    Family History  Problem Relation Age of Onset  . Heart attack Father     Review of Systems:  As stated in the HPI and otherwise negative.   BP 178/78  Pulse 86  Ht 6\' 1"  (1.854 m)  Wt 335 lb (151.955 kg)  BMI 44.21 kg/m2  SpO2 93%  Physical Examination: General: Well developed, well nourished, NAD HEENT: OP clear, mucus membranes moist SKIN: warm, dry. No rashes. Neuro: No focal deficits Musculoskeletal: Muscle strength 5/5 all ext Psychiatric: Mood and affect normal Neck: No JVD, no carotid bruits, no thyromegaly, no lymphadenopathy. Lungs:Clear bilaterally, no wheezes, rhonci, crackles Cardiovascular: Regular rate and rhythm. No murmurs, gallops or rubs. Abdomen:Soft. Bowel sounds present. Non-tender.  Extremities: Trace to 1+ bilateral lower extremity edema. Pulses are 2 + in the bilateral DP/PT.  Echo 10/23/12: Left ventricle: The cavity size was normal. Wall thickness was increased in a pattern of moderate LVH. Systolic function was normal. The estimated ejection fraction was in the range of 55% to 65%. Wall motion was normal; there were no regional wall motion abnormalities. Doppler parameters are consistent with abnormal left ventricular relaxation (grade 1 diastolic dysfunction). - Aortic valve: A prosthesis was present and functioning normally. The  prosthesis had a normal range of motion. The sewing ring appeared normal, had no rocking motion, and showed no evidence of dehiscence. - Left atrium: The atrium was mildly dilated. - Atrial septum: No defect or patent foramen ovale was identified.  Assessment and Plan:   1. Acute on Chronic Diastolic CHF:  Weight is still up but stable. He feels well. Will continue Lasix 160 mg po in the am and 80  mg po in the pm for one week. He will call if his weight stays up in one week and we will get BMET.    2. Aortic stenosis, status post AVR: Well-functioning valve by echo 12/13.   3. HTN: Has been controlled at home. Continue current therapy  4. Hyperlipidemia: Continue statin.

## 2013-07-13 NOTE — Patient Instructions (Addendum)
Your physician wants you to follow-up in:  6 months.  You will receive a reminder letter in the mail two months in advance. If you don't receive a letter, please call our office to schedule the follow-up appointment.  Your physician has recommended you make the following change in your medication:  Take furosemide 160 mg in the AM and 80 mg in the PM for 1 week and then change to furosemide 80 mg by mouth twice daily

## 2013-07-25 ENCOUNTER — Emergency Department (HOSPITAL_COMMUNITY)
Admission: EM | Admit: 2013-07-25 | Discharge: 2013-07-25 | Disposition: A | Discharge status: Home / Self Care | Payer: BC Managed Care – PPO | Attending: Emergency Medicine | Admitting: Emergency Medicine

## 2013-07-25 ENCOUNTER — Encounter (HOSPITAL_COMMUNITY): Payer: Self-pay | Admitting: *Deleted

## 2013-07-25 DIAGNOSIS — Y929 Unspecified place or not applicable: Secondary | ICD-10-CM | POA: Insufficient documentation

## 2013-07-25 DIAGNOSIS — Z79899 Other long term (current) drug therapy: Secondary | ICD-10-CM | POA: Insufficient documentation

## 2013-07-25 DIAGNOSIS — Z8614 Personal history of Methicillin resistant Staphylococcus aureus infection: Secondary | ICD-10-CM | POA: Insufficient documentation

## 2013-07-25 DIAGNOSIS — Z8719 Personal history of other diseases of the digestive system: Secondary | ICD-10-CM | POA: Insufficient documentation

## 2013-07-25 DIAGNOSIS — E119 Type 2 diabetes mellitus without complications: Secondary | ICD-10-CM | POA: Insufficient documentation

## 2013-07-25 DIAGNOSIS — L988 Other specified disorders of the skin and subcutaneous tissue: Secondary | ICD-10-CM | POA: Insufficient documentation

## 2013-07-25 DIAGNOSIS — W19XXXA Unspecified fall, initial encounter: Secondary | ICD-10-CM | POA: Insufficient documentation

## 2013-07-25 DIAGNOSIS — Z794 Long term (current) use of insulin: Secondary | ICD-10-CM | POA: Insufficient documentation

## 2013-07-25 DIAGNOSIS — E785 Hyperlipidemia, unspecified: Secondary | ICD-10-CM | POA: Insufficient documentation

## 2013-07-25 DIAGNOSIS — I5032 Chronic diastolic (congestive) heart failure: Secondary | ICD-10-CM | POA: Insufficient documentation

## 2013-07-25 DIAGNOSIS — Y939 Activity, unspecified: Secondary | ICD-10-CM | POA: Insufficient documentation

## 2013-07-25 DIAGNOSIS — Z8669 Personal history of other diseases of the nervous system and sense organs: Secondary | ICD-10-CM | POA: Insufficient documentation

## 2013-07-25 DIAGNOSIS — M109 Gout, unspecified: Secondary | ICD-10-CM | POA: Insufficient documentation

## 2013-07-25 DIAGNOSIS — R238 Other skin changes: Secondary | ICD-10-CM

## 2013-07-25 DIAGNOSIS — I10 Essential (primary) hypertension: Secondary | ICD-10-CM | POA: Insufficient documentation

## 2013-07-25 DIAGNOSIS — Z7982 Long term (current) use of aspirin: Secondary | ICD-10-CM | POA: Insufficient documentation

## 2013-07-25 DIAGNOSIS — F411 Generalized anxiety disorder: Secondary | ICD-10-CM | POA: Insufficient documentation

## 2013-07-25 DIAGNOSIS — R739 Hyperglycemia, unspecified: Secondary | ICD-10-CM

## 2013-07-25 NOTE — ED Notes (Signed)
Pt reports injuring left 2nd toe and has been to foot dr and had large blister to the toe that was lanced on tues, but has now returned. No acute distress noted at triage.

## 2013-07-25 NOTE — ED Notes (Signed)
Pt left 2nd toe is weeping, swollen, and blistered which started last night after removing a pressure dressing per foot doctors orders.

## 2013-07-25 NOTE — ED Provider Notes (Signed)
  Face-to-face evaluation   History: Patient concerned about swelling, left second toe since removing the bandage yesterday. He had previously had a blister incised, same toe, 4 days prior. No fever, nausea, or vomiting. He has had elevated CBGs for several months. His PCP arranged a new glucose control plan recently.  Physical exam: Obese, alert, cooperative, no apparent distress. Left second toe, with, distal, dorsal, ecchymosis, without bleeding, beneath denuded skin. Proximal to this; there is a 1.5 x 3 cm, raised blister that contains clear fluid. This blister does not appear to be infected. There is no proximal streaking or associated swelling.  Assessment nonspecific blister, without indication for incision and drainage. This can be treated symptomatically, expectantly and followed up with his podiatrist. His elevated CBGs need to be followed up by his PCP  Medical screening examination/treatment/procedure(s) were conducted as a shared visit with non-physician practitioner(s) and myself.  I personally evaluated the patient during the encounter  Flint Melter, MD 07/26/13 [phone]

## 2013-07-25 NOTE — ED Provider Notes (Signed)
CSN: [id number]     Arrival date & time 07/25/13  1129 History  This chart was scribed for non-physician practitioner, Arthor Captain, PA-C, working with Flint Melter, MD by Shari Heritage, ED Scribe. This patient was seen in room TR10C/TR10C and the patient's care was started at 12:25 AM.    Chief Complaint  Patient presents with  . Foot Injury    The history is provided by the patient. No language interpreter was used.    HPI Comments: Antonio [name]. is a 62 y.o. male who presents to the Emergency Department complaining of an injury to the left 2nd toe onset 7 days ago. He states that he originally injured the toe after falling. Patient says that he saw a podiatrist after a blister developed to the toe. This blister was lanced on the right lateral toe on Tuesday (4 days ago), but now blister has returned. He states that his toe changed color from red to blue. He reports decreased sensation to the toe. He denies any pain. He denies fever, chills, nausea, vomiting or other symptoms. He is not currently on antibiotics. He has a history of MRSA and diabetic neuropathy.   Past Medical History  Diagnosis Date  . Hepatitis   . NICM (nonischemic cardiomyopathy)     echo 12/11: EF 45-50%, grade 1 diast dysfxn, moderately severe AS (AVA 0.9, mean 31 mmHg), mild MR;    cath 1/12:  normal cors, AVA 1.0, peak to peak gradiet for AV 31 mmHg, mod-severe AS, EF 35-40%  . 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 Hx MRSA infection     thigh abcess 11/06  . Anxiety   . Aortic stenosis     AVR 11/29/11  . HTN (hypertension)   . DM2 (diabetes mellitus, type 2)   . HLD (hyperlipidemia)   . Gout   . Chronic diastolic heart failure     Echo 09/2012: mod LVH, EF 55-65%, Gr 1 DD, AVR ok, mild LAE.   Past Surgical History  Procedure Laterality Date  . Laminectomy  10/30/83  . Aortic valve replacement  11/29/2011    Procedure: AORTIC VALVE REPLACEMENT (AVR);  Surgeon: [name], MD;   Location: [facility] OR;  Service: Open Heart Surgery;  Laterality: N/A;  with nitric oxide   Family History  Problem Relation Age of Onset  . Heart attack Father    History  Substance Use Topics  . Smoking status: Never Smoker   . Smokeless tobacco: Not on file  . Alcohol Use: Not on file    Review of Systems A complete 10 system review of systems was obtained and all systems are negative except as noted in the HPI and PMH.    Allergies  Review of patient's allergies indicates no known allergies.  Home Medications   Current Outpatient Rx  Name  Route  Sig  Dispense  Refill  . allopurinol (ZYLOPRIM) 100 MG tablet   Oral   Take 100 mg by mouth as needed.         Marland Kitchen amoxicillin (AMOXIL) 500 MG capsule      Take 4 tablets by mouth 60 minutes prior to dental procedure   4 capsule   2   . aspirin 325 MG tablet   Oral   Take 325 mg by mouth daily.         Marland Kitchen atorvastatin (LIPITOR) 40 MG tablet   Oral   Take 1 tablet (40  mg total) by mouth daily.   90 tablet   3   . carisoprodol (SOMA) 350 MG tablet   Oral   Take 350 mg by mouth as needed for muscle spasms.         . carvedilol (COREG) 6.25 MG tablet   Oral   Take 6.25 mg by mouth 2 (two) times daily with a meal.         . colchicine 0.6 MG tablet   Oral   Take 0.6 mg by mouth 2 (two) times daily. gout         . cyclobenzaprine (FLEXERIL) 10 MG tablet   Oral   Take 10 mg by mouth 3 (three) times daily as needed for muscle spasms.         Marland Kitchen escitalopram (LEXAPRO) 10 MG tablet   Oral   Take 10 mg by mouth daily.         Marland Kitchen ezetimibe (ZETIA) 10 MG tablet   Oral   Take 10 mg by mouth daily.         . febuxostat (ULORIC) 40 MG tablet   Oral   Take 40 mg by mouth daily.         . fenofibrate (TRICOR) 145 MG tablet   Oral   Take 145 mg by mouth daily.         . furosemide (LASIX) 80 MG tablet   Oral   Take 1 tablet (80 mg total) by mouth 2 (two) times daily.   60 tablet   6   . glimepiride  (AMARYL) 2 MG tablet   Oral   Take 2 mg by mouth daily before breakfast.         . insulin glargine (LANTUS) 100 UNIT/ML injection   Subcutaneous   Inject 10 Units into the skin at bedtime. As directed         . lisinopril (PRINIVIL,ZESTRIL) 20 MG tablet   Oral   Take 1 tablet (20 mg total) by mouth daily.   30 tablet   11   . terbinafine (LAMISIL) 250 MG tablet   Oral   Take 250 mg by mouth daily.          Triage Vitals: BP 113/60  Pulse 87  Temp(Src) 98.1 F (36.7 C) (Oral)  Resp 22  SpO2 96% Physical Exam  Nursing note and vitals reviewed. Constitutional: He is oriented to person, place, and time. He appears well-developed and well-nourished. No distress.  HENT:  Head: Normocephalic and atraumatic.  Eyes: EOM are normal.  Neck: Neck supple. No tracheal deviation present.  Cardiovascular: Normal rate.   Pulmonary/Chest: Effort normal. No respiratory distress.  Musculoskeletal: Normal range of motion.  2+ DP pulses bilaterally. Two separate bullae on the left 2nd toe. One is open and draining. No purulent discharge. Area warm to touch. Normal ROM of left 2nd toe. No purulent discharge.   Neurological: He is alert and oriented to person, place, and time.  Skin: Skin is warm and dry.  Psychiatric: He has a normal mood and affect. His behavior is normal.    ED Course  Procedures (including critical care time) DIAGNOSTIC STUDIES: Oxygen Saturation is 96% on room air, adequate by my interpretation.    COORDINATION OF CARE: 12:38 PM- Patient informed of current plan for treatment and evaluation and agrees with plan at this time.   12:46 PM- Consulted with attending provider, Dr. Gray Bernhardt, who advised warm soaks and cleanings with soap three times a day  and to wrap with dry dressing. Patient encouraged to follow up with podiatrist in 3-4 days. He was also advised to follow up with PCP within the next week regarding high blood sugar. Patient verbalizes  understanding and agrees with treatment plan.   MDM   1. Bullae   2. Hyperglycemia      Filed Vitals:   07/25/13 1135  BP: 113/60  Pulse: 87  Temp: 98.1 F (36.7 C)  TempSrc: Oral  Resp: 22  SpO2: 96%   Patient seen in shared visit with Dr. Effie Shy.  No signs of infections. Warm soaks. Dry dressings. Cleanse with soap and water TID. F/u with podiatry in 3-4 days. Consult with PCP regarding diabetes management.  I personally performed the services described in this documentation, which was scribed in my presence. The recorded information has been reviewed and is accurate.    Arthor Captain, PA-C 07/25/13 1919

## 2013-07-26 NOTE — ED Provider Notes (Deleted)
Medical screening examination/treatment/procedure(s) were performed by non-physician practitioner and as supervising physician I was immediately available for consultation/collaboration.  Flint Melter, MD 07/26/13 [phone]

## 2013-12-05 IMAGING — CR DG CHEST 1V
1 series · 1 of 1 positions shown · non-contrast
Comparison: Chest x-ray of 11/15/2011

CLINICAL DATA: Follow up opacity in the right lung

CHEST - 1 VIEW

[x chest ap]
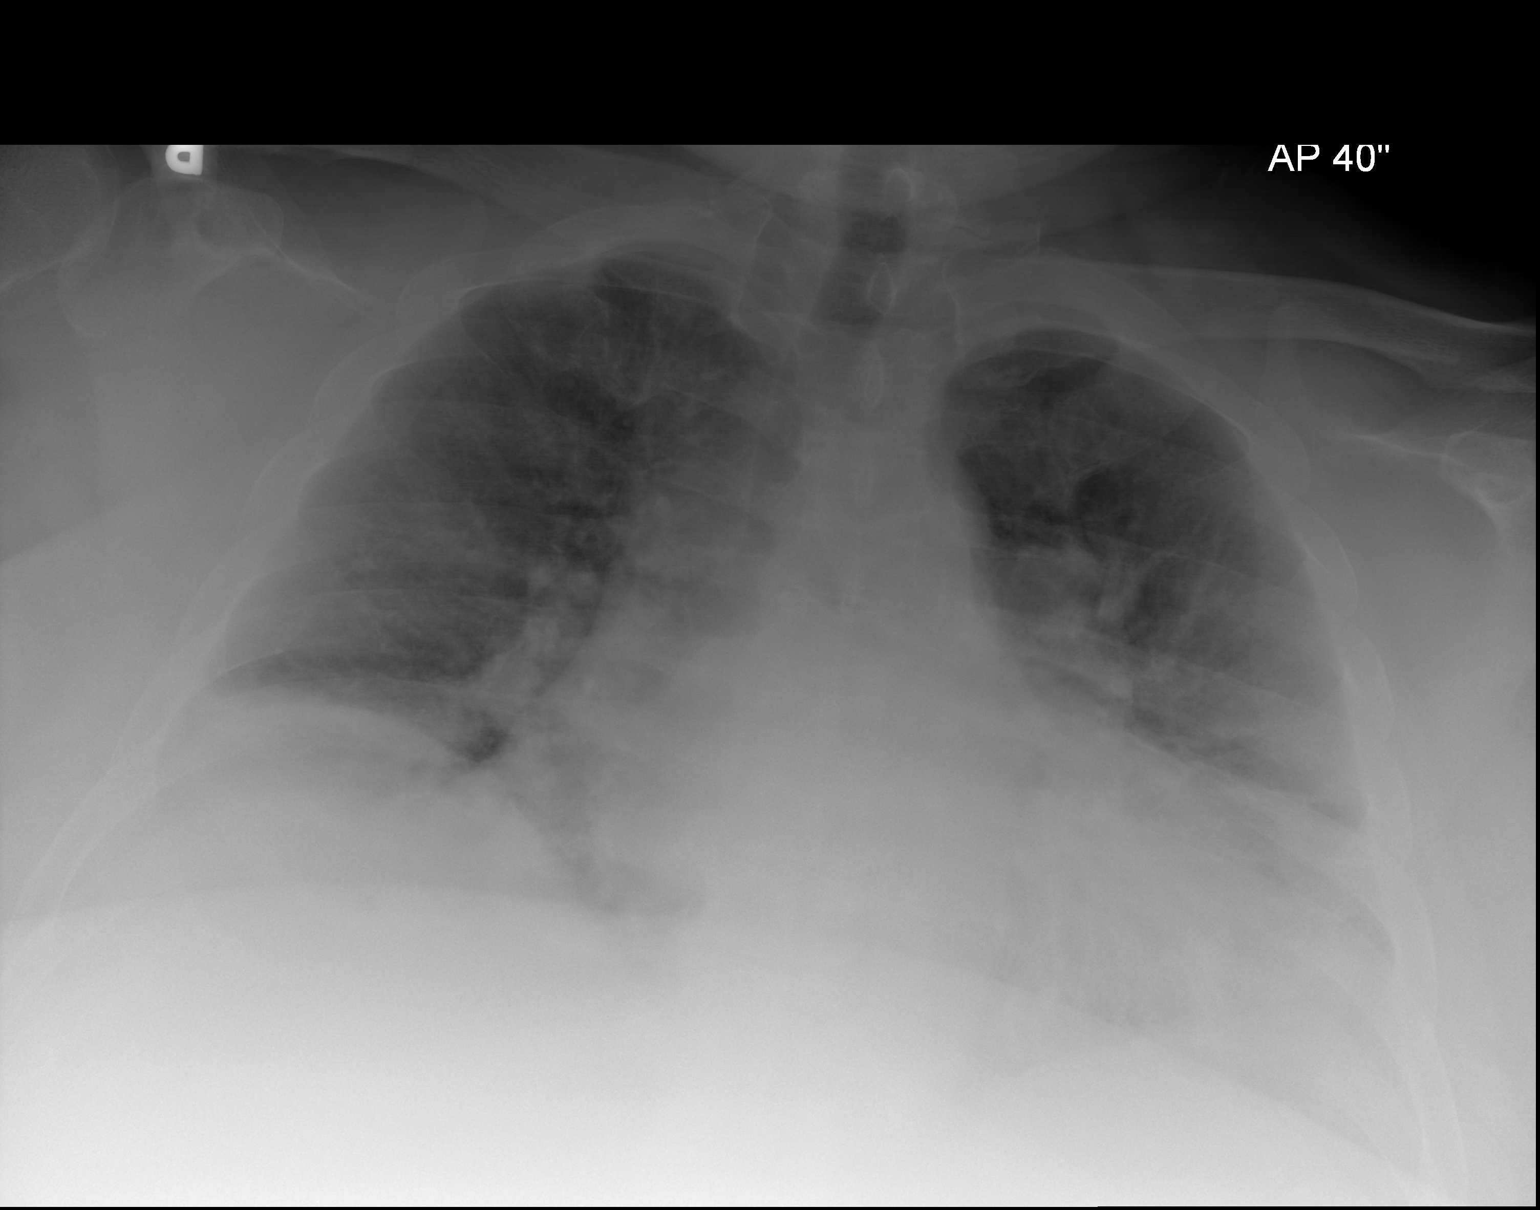

[1 of 1 positions shown; findings below may reference images not displayed]

FINDINGS: The lungs are not as well aerated.  The vague opacity
noted previously the right upper lung field is not seen and may
represent resolving pneumonia or edema. There does appear to be
minimal pulmonary vascular congestion present.  Cardiomegaly is
stable.
IMPRESSION: Stable cardiomegaly.  No definite infiltrate or effusion. Probable
minimal pulmonary vascular congestion.

## 2013-12-06 IMAGING — PX DG ORTHOPANTOGRAM /PANORAMIC
1 series · 1 of 1 positions shown · non-contrast
Comparison: None.

CLINICAL DATA: Oral evaluation prior to anticipated valve surgery.

PANOREX ONE-VIEW

[Series 1: — · U · 1 of 1 slices shown]
[im 1/1]
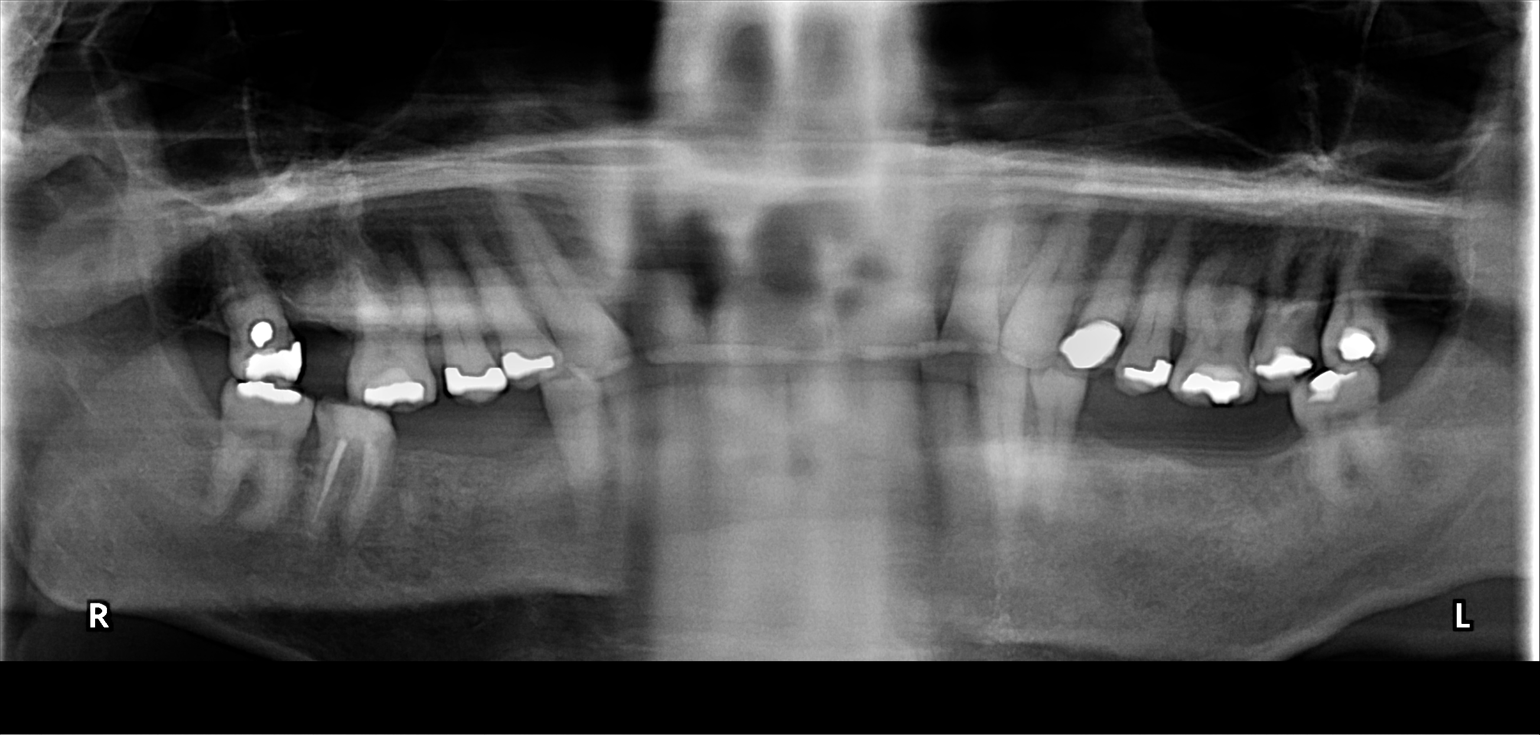

[1 of 1 positions shown; findings below may reference images not displayed]

FINDINGS: Study is motion limited.  The patient was unable to stand
for the examination.  There is prominent midline artifact, limiting
evaluation of the central and lateral incisors.  Some of the molars
are absent.  No significant periodontal disease is identified.
There is no evidence of acute fracture.  The temporal mandibular
joints are not imaged.
IMPRESSION: Limited study shows no evidence of significant periodontal disease.
Midline evaluation is not possible.

## 2013-12-21 IMAGING — CR DG CHEST 1V PORT SAME DAY
1 series · 1 of 1 positions shown · non-contrast
Comparison: 12/01/2011

CLINICAL DATA: Aortic valve replacement, postoperative exam,
extubated

PORTABLE CHEST - 1 VIEW SAME DAY

[AP]
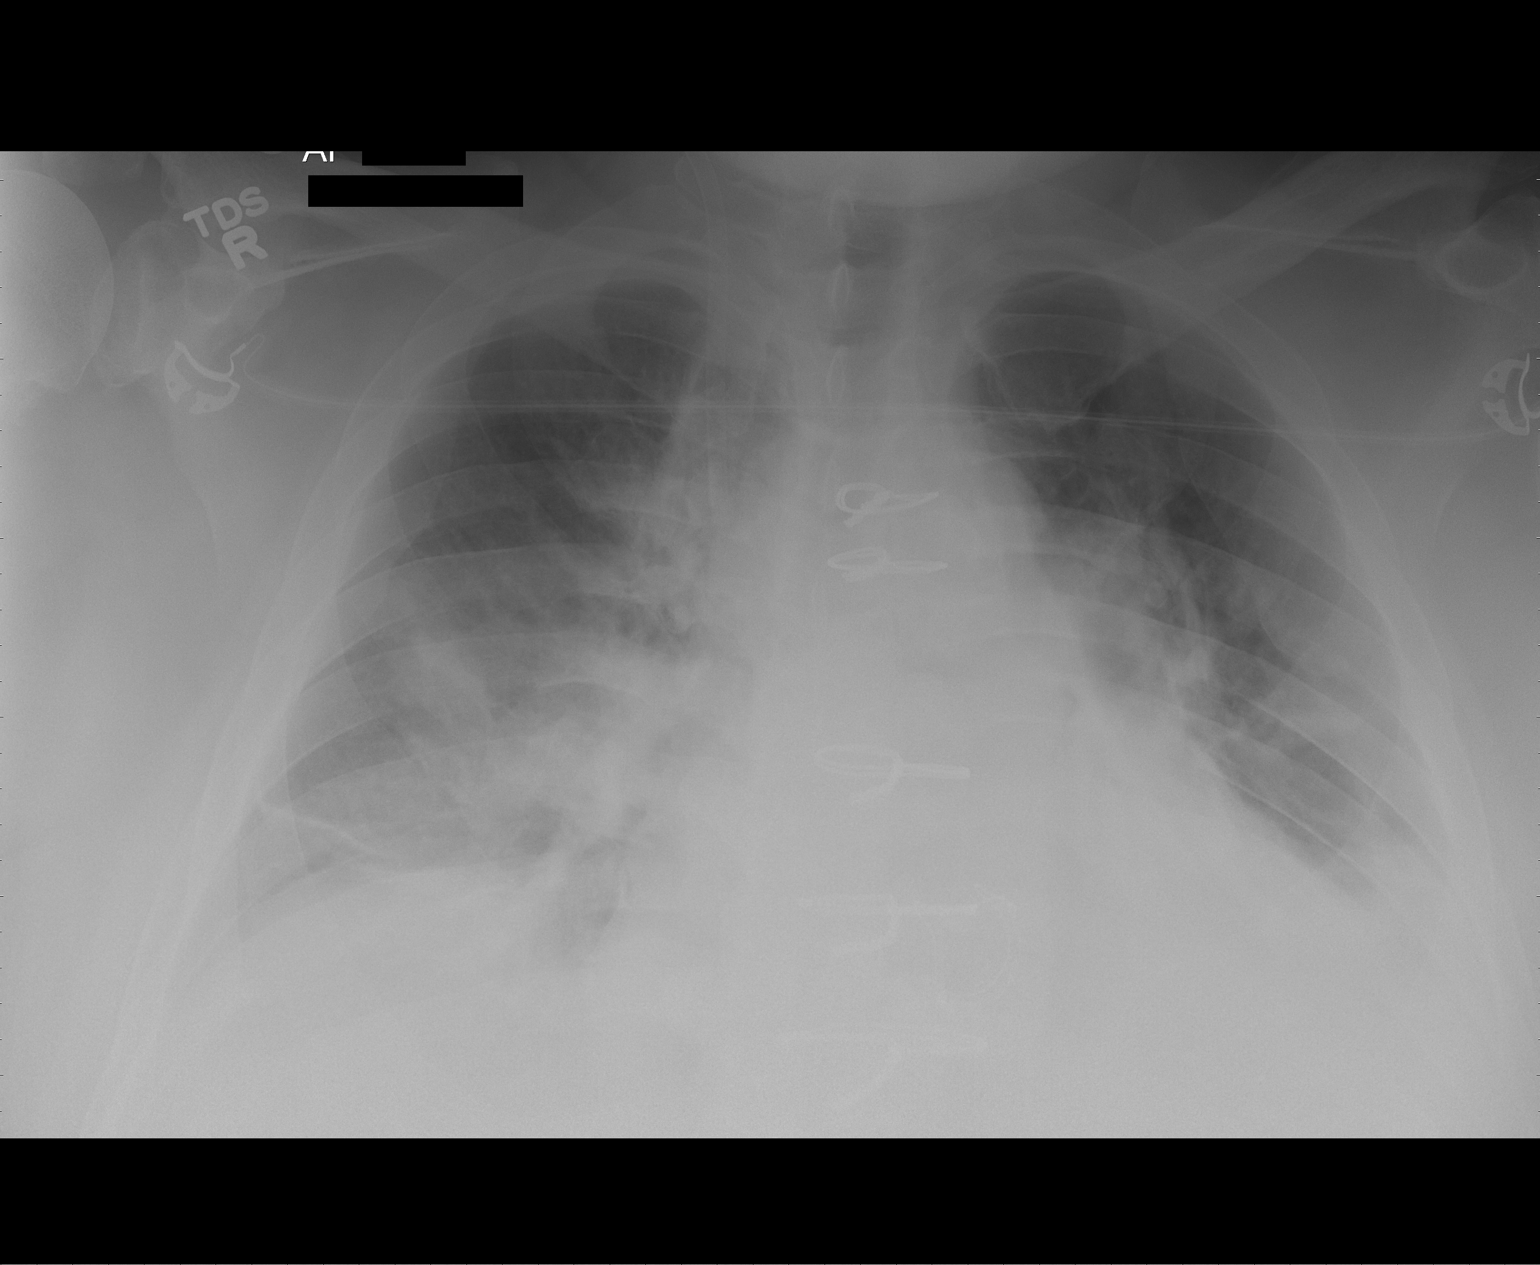

[1 of 1 positions shown; findings below may reference images not displayed]

FINDINGS: Endotracheal tube, NG tube, Swan-Ganz catheter removed.
Right IJ vascular sheath remains.  Heart remains enlarged with
patchy bilateral perihilar and lower lobe airspace
disease/atelectasis.  Pleural effusions remain.  No pneumothorax.
No significant interval change.
IMPRESSION: Extubated.  Otherwise stable portable chest exam.

## 2013-12-29 ENCOUNTER — Encounter: Payer: Self-pay | Admitting: Physician Assistant

## 2013-12-29 ENCOUNTER — Inpatient Hospital Stay (HOSPITAL_COMMUNITY): Admission: AD | Admit: 2013-12-29 | Payer: Self-pay | Source: Ambulatory Visit | Admitting: Cardiology

## 2013-12-29 ENCOUNTER — Ambulatory Visit (INDEPENDENT_AMBULATORY_CARE_PROVIDER_SITE_OTHER): Payer: BC Managed Care – PPO | Admitting: Physician Assistant

## 2013-12-29 ENCOUNTER — Encounter (INDEPENDENT_AMBULATORY_CARE_PROVIDER_SITE_OTHER): Payer: Self-pay

## 2013-12-29 VITALS — BP 120/70 | HR 83 | Ht 73.0 in | Wt 381.0 lb

## 2013-12-29 DIAGNOSIS — E785 Hyperlipidemia, unspecified: Secondary | ICD-10-CM

## 2013-12-29 DIAGNOSIS — I5032 Chronic diastolic (congestive) heart failure: Secondary | ICD-10-CM

## 2013-12-29 DIAGNOSIS — I359 Nonrheumatic aortic valve disorder, unspecified: Secondary | ICD-10-CM

## 2013-12-29 DIAGNOSIS — Z954 Presence of other heart-valve replacement: Secondary | ICD-10-CM

## 2013-12-29 DIAGNOSIS — I350 Nonrheumatic aortic (valve) stenosis: Secondary | ICD-10-CM

## 2013-12-29 DIAGNOSIS — N189 Chronic kidney disease, unspecified: Secondary | ICD-10-CM

## 2013-12-29 DIAGNOSIS — I5033 Acute on chronic diastolic (congestive) heart failure: Secondary | ICD-10-CM

## 2013-12-29 DIAGNOSIS — I10 Essential (primary) hypertension: Secondary | ICD-10-CM

## 2013-12-29 DIAGNOSIS — Z952 Presence of prosthetic heart valve: Secondary | ICD-10-CM

## 2013-12-29 LAB — CBC WITH DIFFERENTIAL/PLATELET
BASOS ABS: 0 10*3/uL (ref 0.0–0.1)
BASOS PCT: 0.5 % (ref 0.0–3.0)
Eosinophils Absolute: 0.2 10*3/uL (ref 0.0–0.7)
Eosinophils Relative: 2.2 % (ref 0.0–5.0)
HEMATOCRIT: 39 % (ref 39.0–52.0)
HEMOGLOBIN: 12.7 g/dL — AB (ref 13.0–17.0)
Lymphocytes Relative: 31.3 % (ref 12.0–46.0)
Lymphs Abs: 2.4 10*3/uL (ref 0.7–4.0)
MCHC: 32.6 g/dL (ref 30.0–36.0)
MCV: 80.4 fl (ref 78.0–100.0)
MONOS PCT: 7.8 % (ref 3.0–12.0)
Monocytes Absolute: 0.6 10*3/uL (ref 0.1–1.0)
NEUTROS ABS: 4.5 10*3/uL (ref 1.4–7.7)
Neutrophils Relative %: 58.2 % (ref 43.0–77.0)
Platelets: 295 10*3/uL (ref 150.0–400.0)
RBC: 4.86 Mil/uL (ref 4.22–5.81)
RDW: 15.4 % — ABNORMAL HIGH (ref 11.5–14.6)
WBC: 7.8 10*3/uL (ref 4.5–10.5)

## 2013-12-29 LAB — BASIC METABOLIC PANEL
BUN: 34 mg/dL — ABNORMAL HIGH (ref 6–23)
CO2: 30 meq/L (ref 19–32)
Calcium: 9 mg/dL (ref 8.4–10.5)
Chloride: 96 mEq/L (ref 96–112)
Creatinine, Ser: 1.4 mg/dL (ref 0.4–1.5)
GFR: 53.48 mL/min — AB (ref >60.00)
Glucose, Bld: 94 mg/dL (ref 70–99)
POTASSIUM: 3.4 meq/L — AB (ref 3.5–5.1)
SODIUM: 137 meq/L (ref 135–145)

## 2013-12-29 LAB — TSH: TSH: 4.89 u[IU]/mL (ref 0.35–5.50)

## 2013-12-29 MED ORDER — METOLAZONE 5 MG PO TABS: ORAL_TABLET | ORAL | Status: DC

## 2013-12-29 MED ORDER — FUROSEMIDE 80 MG PO TABS: 80.0000 mg | ORAL_TABLET | ORAL | Status: DC

## 2013-12-29 NOTE — Patient Instructions (Addendum)
START TODAY TAKING METOLAZONE 5 MG FOR THE NEXT 3 DAYS; YOU WILL TAKE THIS 30 MINUTES BEFORE YOUR MORNING LASIX 80 MG TWICE DAILY  STARTING ON Friday 01/01/14 YOU WILL INCREASE LASIX TO 160 MG IN THE MORNING AND 80 MG IN THE EAFTERNOON  LAB WORK TODAY; BMET, CBC W/DIFF, TSH, BNP  STAT LAB WORK 01/01/14 Friday PER SCOTT WEAVER, PAC  PLEASE FOLLOW UP WITH DR. Clifton JamesMCALHANY IN 1 WEEK OR SCOTT WEAVER, PAC SAME DAY DR. Clifton JamesMCALHANY IS IN THE OFFICE.    2 Gram Low Sodium Diet A 2 gram sodium diet restricts the amount of sodium in the diet to no more than 2 g or 2000 mg daily. Limiting the amount of sodium is often used to help lower blood pressure. It is important if you have heart, liver, or kidney problems. Many foods contain sodium for flavor and sometimes as a preservative. When the amount of sodium in a diet needs to be low, it is important to know what to look for when choosing foods and drinks. The following includes some information and guidelines to help make it easier for you to adapt to a low sodium diet. QUICK TIPS  Do not add salt to food.  Avoid convenience items and fast food.  Choose unsalted snack foods.  Buy lower sodium products, often labeled as "lower sodium" or "no salt added."  Check food labels to learn how much sodium is in 1 serving.  When eating at a restaurant, ask that your food be prepared with less salt or none, if possible. READING FOOD LABELS FOR SODIUM INFORMATION The nutrition facts label is a good place to find how much sodium is in foods. Look for products with no more than 500 to 600 mg of sodium per meal and no more than 150 mg per serving. Remember that 2 g = 2000 mg. The food label may also list foods as:  Sodium-free: Less than 5 mg in a serving.  Very low sodium: 35 mg or less in a serving.  Low-sodium: 140 mg or less in a serving.  Light in sodium: 50% less sodium in a serving. For example, if a food that usually has 300 mg of sodium is changed to become  light in sodium, it will have 150 mg of sodium.  Reduced sodium: 25% less sodium in a serving. For example, if a food that usually has 400 mg of sodium is changed to reduced sodium, it will have 300 mg of sodium. CHOOSING FOODS Grains  Avoid: Salted crackers and snack items. Some cereals, including instant hot cereals. Bread stuffing and biscuit mixes. Seasoned rice or pasta mixes.  Choose: Unsalted snack items. Low-sodium cereals, oats, puffed wheat and rice, shredded wheat. English muffins and bread. Pasta. Meats  Avoid: Salted, canned, smoked, spiced, pickled meats, including fish and poultry. Bacon, ham, sausage, cold cuts, hot dogs, anchovies.  Choose: Low-sodium canned tuna and salmon. Fresh or frozen meat, poultry, and fish. Dairy  Avoid: Processed cheese and spreads. Cottage cheese. Buttermilk and condensed milk. Regular cheese.  Choose: Milk. Low-sodium cottage cheese. Yogurt. Sour cream. Low-sodium cheese. Fruits and Vegetables  Avoid: Regular canned vegetables. Regular canned tomato sauce and paste. Frozen vegetables in sauces. Olives. Rosita FirePickles. Relishes. Sauerkraut.  Choose: Low-sodium canned vegetables. Low-sodium tomato sauce and paste. Frozen or fresh vegetables. Fresh and frozen fruit. Condiments  Avoid: Canned and packaged gravies. Worcestershire sauce. Tartar sauce. Barbecue sauce. Soy sauce. Steak sauce. Ketchup. Onion, garlic, and table salt. Meat flavorings and tenderizers.  Choose: Fresh and dried herbs and spices. Low-sodium varieties of mustard and ketchup. Lemon juice. Tabasco sauce. Horseradish. SAMPLE 2 GRAM SODIUM MEAL PLAN Breakfast / Sodium (mg)  1 cup low-fat milk / 143 mg  2 slices whole-wheat toast / 270 mg  1 tbs heart-healthy margarine / 153 mg  1 hard-boiled egg / 139 mg  1 small orange / 0 mg Lunch / Sodium (mg)  1 cup raw carrots / 76 mg   cup hummus / 298 mg  1 cup low-fat milk / 143 mg   cup red grapes / 2 mg  1 whole-wheat  pita bread / 356 mg Dinner / Sodium (mg)  1 cup whole-wheat pasta / 2 mg  1 cup low-sodium tomato sauce / 73 mg  3 oz lean ground beef / 57 mg  1 small side salad (1 cup raw spinach leaves,  cup cucumber,  cup yellow bell pepper) with 1 tsp olive oil and 1 tsp red wine vinegar / 25 mg Snack / Sodium (mg)  1 container low-fat vanilla yogurt / 107 mg  3 graham cracker squares / 127 mg Nutrient Analysis  Calories: 2033  Protein: 77 g  Carbohydrate: 282 g  Fat: 72 g  Sodium: 1971 mg Document Released: 10/15/2005 Document Revised: 01/07/2012 Document Reviewed: 01/16/2010 Exit[employer] Patient Information 2014 [place], [place].

## 2013-12-29 NOTE — Progress Notes (Signed)
[address] [place], [place]  [address] Phone: [phone] Fax:  [phone]  Date:  12/29/2013   ID:  Antonio Smothers., DOB [birth year], MRN [id number]  PCP:  [name], MD  Cardiologist:  Dr. Verne Carrow     History of Present Illness: Antonio Corpus. is a 63 y.o. male with a hx of severe AS s/p bioprosthetic AVR in 10/2011, combined systolic and diastolic CHF, NICM, DM, HTN, HL, OSA.  He did well after AVR but required several more weeks in the hospital for diuresis for volume excess.  He had post op AFib requiring Amiodarone.  EF improved after his AVR.  Last seen by Dr. Verne Carrow 06/2013.       Echo (05/2011):  EF 45-50%; severe AS (mean 49 mmHg). Lexiscan Myoview (10/2010):  IL scar with mild peri-infarct ischemia, EF 34%. LHC (10/2010):  No CAD, EF 35-40%; AV peak to peak gradient of 31 mmHg. Carotid US (10/2011): No ICA stenosis. Holter (01/2012):  NSR; no AFib Echo (09/2012):  Mod LVH, EF 55-65%, no RWMA, Gr 1 DD, AVR ok, mild LAE.    He returns today with complaints of increasing edema, weight and shortness of breath over the last 3 months. By our scales, he is up 46 pounds. He admits to eating a local diner that is supposed to be serving low-sodium food. He also admits to drinking a sports drink several times a day at large volumes. He describes NYHA class III dyspnea. He denies chest pain. He sleeps in a recliner because of back pain. He uses a walker because of chronic low back pain. He denies PND. He denies syncope.  Recent Labs: 02/25/2013: Pro B Natriuretic peptide (BNP) 42.0  03/04/2013: Creatinine 1.5; Potassium 4.3   Wt Readings from Last 3 Encounters:  12/29/13 381 lb (172.82 kg)  07/13/13 335 lb (151.955 kg)  03/11/13 351 lb 12.8 oz (159.575 kg)     Past Medical History  Diagnosis Date  . Hepatitis   . NICM (nonischemic cardiomyopathy)     echo 12/11: EF 45-50%, grade 1 diast dysfxn, moderately severe AS (AVA 0.9, mean 31  mmHg), mild MR;    cath 1/12:  normal cors, AVA 1.0, peak to peak gradiet for AV 31 mmHg, mod-severe AS, EF 35-40%  . 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 Hx MRSA infection     thigh abcess 11/06  . Anxiety   . Aortic stenosis     AVR 11/29/11  . HTN (hypertension)   . DM2 (diabetes mellitus, type 2)   . HLD (hyperlipidemia)   . Gout   . Chronic diastolic heart failure     Echo 09/2012: mod LVH, EF 55-65%, Gr 1 DD, AVR ok, mild LAE.    Current Outpatient Prescriptions  Medication Sig Dispense Refill  . aspirin 325 MG tablet Take 325 mg by mouth daily.      Marland Kitchen atorvastatin (LIPITOR) 40 MG tablet Take 40 mg by mouth daily.      . carisoprodol (SOMA) 350 MG tablet Take 350 mg by mouth 4 (four) times daily as needed for muscle spasms.      . carvedilol (COREG) 6.25 MG tablet Take 6.25 mg by mouth 2 (two) times daily with a meal.      . colchicine 0.6 MG tablet Take 0.6 mg by mouth 2 (two) times daily. gout      . cyclobenzaprine (FLEXERIL) 10 MG  tablet Take 10 mg by mouth 3 (three) times daily as needed for muscle spasms.      Marland Kitchen. escitalopram (LEXAPRO) 10 MG tablet Take 10 mg by mouth daily.      Marland Kitchen. ezetimibe (ZETIA) 10 MG tablet Take 10 mg by mouth daily.      . febuxostat (ULORIC) 40 MG tablet Take 40 mg by mouth daily.      . fenofibrate (TRICOR) 145 MG tablet Take 145 mg by mouth daily.      . furosemide (LASIX) 80 MG tablet Take 80-160 mg by mouth 2 (two) times daily. Currently taking 2 tablets (160mg ) in the morning a 1 tablet (80mg ) at night per Cardiologist      . glimepiride (AMARYL) 2 MG tablet Take 2 mg by mouth daily before breakfast.      . insulin glargine (LANTUS) 100 UNIT/ML injection Inject 40-50 Units into the skin daily. As directed      . lisinopril (PRINIVIL,ZESTRIL) 20 MG tablet Take 20 mg by mouth daily.      Marland Kitchen. terbinafine (LAMISIL) 1 % cream Apply 1 application topically 2 (two) times daily.       No current facility-administered medications for this  visit.    Allergies:   Review of patient's allergies indicates no known allergies.   Social History:  The patient  reports that he has never smoked. He does not have any smokeless tobacco history on file.   Family History:  The patient's family history includes Heart attack in his father.   ROS:  Please see the history of present illness.   He has some right-sided posterior rib pain worse with positional changes. He said some mild diarrhea recently.   All other systems reviewed and negative.   PHYSICAL EXAM: VS:  BP 120/70  Pulse 83  Ht 6\' 1"  (1.854 m)  Wt 381 lb (172.82 kg)  BMI 50.28 kg/m2 Well nourished, well developed, in no acute distress HEENT: normal Neck: + JVDat 90 Cardiac:  normal S1, S2; RRR; 1/6 murmur @ RUSB Lungs:  clear to auscultation bilaterally, no wheezing, rhonchi or rales Back: Trace edema from buttocks to scapula Abd: distended, non-tender Ext: 1-2+ bilateral LE edema Skin: warm and dry Neuro:  CNs 2-12 intact, no focal abnormalities noted  EKG:  NSR, HR 83, iRBBB     ASSESSMENT AND PLAN:  1. Acute on Chronic Diastolic CHF:  He appears to have more right-sided HF than left. However, he is significantly volume overloaded.  We had a long discussion regarding whether or not to the hospital for IV diuresis versus attempting outpatient therapy. I believe it is feasible to attempt outpatient therapy. He is comfortable with this approach.  I will add metolazone 5 mg daily for 3 days. He will take this 30 minutes before one dose of his Lasix per day.  He will then continue Lasix at 160 mg in the morning and 80 mg in the evening. Check a basic metabolic panel, BNP, CBC, TSH. He will need a repeat basic metabolic panel in 3 days.  He will be brought back in close follow up.  We discussed the importance of reducing sodium in his diet. He has been advised to stop drinking the sports drink as well as to stop eating at the local diner. 2. Aortic Stenosis s/p AVR:  Stable by  last echo. Continue SBE prophylaxis.   3. Hypertension:  Controlled.   4. Hyperlipidemia:  Continue statin. 5. Chronic Kidney Disease:  Keep close eye  on renal fxn with diuresis. 6. Disposition:  Follow up with me or Dr. Verne Carrow in 1 week. If there's no improvement, he will need admission to the hospital for IV diuresis.  Signed, [name], PA-C  12/29/2013 9:40 AM

## 2013-12-30 ENCOUNTER — Telehealth: Payer: Self-pay | Admitting: *Deleted

## 2013-12-30 MED ORDER — POTASSIUM CHLORIDE CRYS ER 20 MEQ PO TBCR: EXTENDED_RELEASE_TABLET | ORAL | Status: DC

## 2013-12-30 NOTE — Telephone Encounter (Signed)
Pt notified about lab results and needs to start K+ 20 meq twice daily; pt advised on the days he takes metolazone he will need to take an EXTRA 40 meq K+; Pt verbalized understanding to the Plan of Care; he is coming in 3/6 for a STAT bmet

## 2014-01-01 ENCOUNTER — Other Ambulatory Visit: Payer: BC Managed Care – PPO

## 2014-01-02 ENCOUNTER — Encounter (HOSPITAL_COMMUNITY): Payer: Self-pay | Admitting: *Deleted

## 2014-01-02 ENCOUNTER — Inpatient Hospital Stay (HOSPITAL_COMMUNITY)
Admission: AD | Admit: 2014-01-02 | Discharge: 2014-01-05 | DRG: 314 | Disposition: A | Discharge status: Home / Self Care | Payer: BC Managed Care – PPO | Source: Other Acute Inpatient Hospital | Attending: Internal Medicine | Admitting: Internal Medicine

## 2014-01-02 DIAGNOSIS — I5032 Chronic diastolic (congestive) heart failure: Secondary | ICD-10-CM

## 2014-01-02 DIAGNOSIS — Z8249 Family history of ischemic heart disease and other diseases of the circulatory system: Secondary | ICD-10-CM

## 2014-01-02 DIAGNOSIS — I871 Compression of vein: Secondary | ICD-10-CM

## 2014-01-02 DIAGNOSIS — M545 Low back pain, unspecified: Secondary | ICD-10-CM

## 2014-01-02 DIAGNOSIS — Z91199 Patient's noncompliance with other medical treatment and regimen due to unspecified reason: Secondary | ICD-10-CM

## 2014-01-02 DIAGNOSIS — IMO0001 Reserved for inherently not codable concepts without codable children: Secondary | ICD-10-CM

## 2014-01-02 DIAGNOSIS — M109 Gout, unspecified: Secondary | ICD-10-CM

## 2014-01-02 DIAGNOSIS — G4730 Sleep apnea, unspecified: Secondary | ICD-10-CM

## 2014-01-02 DIAGNOSIS — Z8614 Personal history of Methicillin resistant Staphylococcus aureus infection: Secondary | ICD-10-CM

## 2014-01-02 DIAGNOSIS — F411 Generalized anxiety disorder: Secondary | ICD-10-CM | POA: Diagnosis present

## 2014-01-02 DIAGNOSIS — E860 Dehydration: Secondary | ICD-10-CM | POA: Diagnosis present

## 2014-01-02 DIAGNOSIS — I509 Heart failure, unspecified: Secondary | ICD-10-CM | POA: Diagnosis present

## 2014-01-02 DIAGNOSIS — I359 Nonrheumatic aortic valve disorder, unspecified: Secondary | ICD-10-CM

## 2014-01-02 DIAGNOSIS — I10 Essential (primary) hypertension: Secondary | ICD-10-CM

## 2014-01-02 DIAGNOSIS — M479 Spondylosis, unspecified: Secondary | ICD-10-CM

## 2014-01-02 DIAGNOSIS — L02419 Cutaneous abscess of limb, unspecified: Secondary | ICD-10-CM | POA: Diagnosis present

## 2014-01-02 DIAGNOSIS — T502X5A Adverse effect of carbonic-anhydrase inhibitors, benzothiadiazides and other diuretics, initial encounter: Secondary | ICD-10-CM | POA: Diagnosis present

## 2014-01-02 DIAGNOSIS — Z794 Long term (current) use of insulin: Secondary | ICD-10-CM

## 2014-01-02 DIAGNOSIS — G4733 Obstructive sleep apnea (adult) (pediatric): Secondary | ICD-10-CM | POA: Diagnosis present

## 2014-01-02 DIAGNOSIS — E669 Obesity, unspecified: Secondary | ICD-10-CM | POA: Diagnosis present

## 2014-01-02 DIAGNOSIS — I428 Other cardiomyopathies: Secondary | ICD-10-CM | POA: Diagnosis present

## 2014-01-02 DIAGNOSIS — I5023 Acute on chronic systolic (congestive) heart failure: Secondary | ICD-10-CM

## 2014-01-02 DIAGNOSIS — Z9119 Patient's noncompliance with other medical treatment and regimen: Secondary | ICD-10-CM

## 2014-01-02 DIAGNOSIS — H5440 Blindness, one eye, unspecified eye: Secondary | ICD-10-CM | POA: Diagnosis present

## 2014-01-02 DIAGNOSIS — R0602 Shortness of breath: Secondary | ICD-10-CM

## 2014-01-02 DIAGNOSIS — Z6841 Body Mass Index (BMI) 40.0 and over, adult: Secondary | ICD-10-CM

## 2014-01-02 DIAGNOSIS — Z7982 Long term (current) use of aspirin: Secondary | ICD-10-CM

## 2014-01-02 DIAGNOSIS — I5043 Acute on chronic combined systolic (congestive) and diastolic (congestive) heart failure: Secondary | ICD-10-CM

## 2014-01-02 DIAGNOSIS — I480 Paroxysmal atrial fibrillation: Secondary | ICD-10-CM

## 2014-01-02 DIAGNOSIS — R9439 Abnormal result of other cardiovascular function study: Secondary | ICD-10-CM

## 2014-01-02 DIAGNOSIS — I959 Hypotension, unspecified: Secondary | ICD-10-CM

## 2014-01-02 DIAGNOSIS — Z952 Presence of prosthetic heart valve: Secondary | ICD-10-CM

## 2014-01-02 DIAGNOSIS — L03119 Cellulitis of unspecified part of limb: Secondary | ICD-10-CM

## 2014-01-02 DIAGNOSIS — E1165 Type 2 diabetes mellitus with hyperglycemia: Secondary | ICD-10-CM

## 2014-01-02 DIAGNOSIS — N179 Acute kidney failure, unspecified: Secondary | ICD-10-CM | POA: Diagnosis present

## 2014-01-02 DIAGNOSIS — E785 Hyperlipidemia, unspecified: Secondary | ICD-10-CM

## 2014-01-02 DIAGNOSIS — E876 Hypokalemia: Secondary | ICD-10-CM | POA: Diagnosis present

## 2014-01-02 DIAGNOSIS — I429 Cardiomyopathy, unspecified: Secondary | ICD-10-CM

## 2014-01-02 LAB — TROPONIN I: Troponin I: <0.3 ng/mL (ref <0.30)

## 2014-01-02 LAB — MRSA PCR SCREENING: MRSA BY PCR: NEGATIVE

## 2014-01-02 LAB — PRO B NATRIURETIC PEPTIDE: PRO B NATRI PEPTIDE: 73.6 pg/mL (ref 0–125)

## 2014-01-02 LAB — GLUCOSE, CAPILLARY: GLUCOSE-CAPILLARY: 107 mg/dL — AB (ref 70–99)

## 2014-01-02 MED ORDER — ASPIRIN 325 MG PO TABS
325.0000 mg | ORAL_TABLET | Freq: Every day | ORAL | Status: DC
  Administered 2014-01-03 – 2014-01-05 (×3): 325 mg via ORAL
  Filled 2014-01-02 (×3): qty 1

## 2014-01-02 MED ORDER — ONDANSETRON HCL 4 MG/2ML IJ SOLN
4.0000 mg | Freq: Four times a day (QID) | INTRAMUSCULAR | Status: DC | PRN
Start: 2014-01-02 — End: 2014-01-05

## 2014-01-02 MED ORDER — INSULIN GLARGINE 100 UNIT/ML ~~LOC~~ SOLN
100.0000 [IU] | Freq: Every day | SUBCUTANEOUS | Status: DC
  Administered 2014-01-03 – 2014-01-05 (×3): 100 [IU] via SUBCUTANEOUS
  Filled 2014-01-02 (×3): qty 1

## 2014-01-02 MED ORDER — CARISOPRODOL 350 MG PO TABS
350.0000 mg | ORAL_TABLET | Freq: Four times a day (QID) | ORAL | Status: DC | PRN
Start: 2014-01-02 — End: 2014-01-05
  Administered 2014-01-02: 350 mg via ORAL
  Filled 2014-01-02: qty 1

## 2014-01-02 MED ORDER — COLCHICINE 0.6 MG PO TABS
0.6000 mg | ORAL_TABLET | Freq: Two times a day (BID) | ORAL | Status: DC
  Administered 2014-01-02 – 2014-01-05 (×6): 0.6 mg via ORAL
  Filled 2014-01-02 (×7): qty 1

## 2014-01-02 MED ORDER — SODIUM CHLORIDE 0.9 % IV SOLN
INTRAVENOUS | Status: DC
  Administered 2014-01-02: 21:00:00 via INTRAVENOUS

## 2014-01-02 MED ORDER — LISINOPRIL 20 MG PO TABS
20.0000 mg | ORAL_TABLET | Freq: Every day | ORAL | Status: DC
  Administered 2014-01-03: 20 mg via ORAL
  Filled 2014-01-02: qty 1

## 2014-01-02 MED ORDER — ATORVASTATIN CALCIUM 40 MG PO TABS
40.0000 mg | ORAL_TABLET | Freq: Every day | ORAL | Status: DC
Start: 2014-01-03 — End: 2014-01-05
  Administered 2014-01-03 – 2014-01-05 (×3): 40 mg via ORAL
  Filled 2014-01-02 (×3): qty 1

## 2014-01-02 MED ORDER — EZETIMIBE 10 MG PO TABS
10.0000 mg | ORAL_TABLET | Freq: Every day | ORAL | Status: DC
  Administered 2014-01-03 – 2014-01-05 (×3): 10 mg via ORAL
  Filled 2014-01-02 (×3): qty 1

## 2014-01-02 MED ORDER — FEBUXOSTAT 40 MG PO TABS
40.0000 mg | ORAL_TABLET | Freq: Every day | ORAL | Status: DC
Start: 2014-01-03 — End: 2014-01-05
  Administered 2014-01-03 – 2014-01-05 (×3): 40 mg via ORAL
  Filled 2014-01-02 (×3): qty 1

## 2014-01-02 MED ORDER — ENOXAPARIN SODIUM 80 MG/0.8ML ~~LOC~~ SOLN
80.0000 mg | SUBCUTANEOUS | Status: DC
  Administered 2014-01-02 – 2014-01-04 (×3): 80 mg via SUBCUTANEOUS
  Filled 2014-01-02 (×4): qty 0.8

## 2014-01-02 MED ORDER — SODIUM CHLORIDE 0.9 % IJ SOLN
3.0000 mL | Freq: Two times a day (BID) | INTRAMUSCULAR | Status: DC
  Administered 2014-01-03 – 2014-01-05 (×4): 3 mL via INTRAVENOUS

## 2014-01-02 MED ORDER — HYDROCODONE-ACETAMINOPHEN 5-325 MG PO TABS: 1.0000 | ORAL_TABLET | ORAL | Status: DC | PRN

## 2014-01-02 MED ORDER — CYCLOBENZAPRINE HCL 10 MG PO TABS
10.0000 mg | ORAL_TABLET | Freq: Three times a day (TID) | ORAL | Status: DC | PRN
  Administered 2014-01-02: 10 mg via ORAL
  Filled 2014-01-02: qty 1

## 2014-01-02 MED ORDER — ONDANSETRON HCL 4 MG PO TABS: 4.0000 mg | ORAL_TABLET | Freq: Four times a day (QID) | ORAL | Status: DC | PRN

## 2014-01-02 MED ORDER — OXYCODONE-ACETAMINOPHEN 5-325 MG PO TABS
1.0000 | ORAL_TABLET | ORAL | Status: DC | PRN
  Administered 2014-01-03 – 2014-01-05 (×8): 2 via ORAL
  Filled 2014-01-02 (×8): qty 2

## 2014-01-02 MED ORDER — HYDROMORPHONE HCL PF 1 MG/ML IJ SOLN
1.0000 mg | INTRAMUSCULAR | Status: DC | PRN
  Administered 2014-01-02: 1 mg via INTRAVENOUS
  Filled 2014-01-02: qty 1

## 2014-01-02 MED ORDER — ESCITALOPRAM OXALATE 10 MG PO TABS
10.0000 mg | ORAL_TABLET | Freq: Every day | ORAL | Status: DC
Start: 2014-01-03 — End: 2014-01-05
  Administered 2014-01-03 – 2014-01-05 (×3): 10 mg via ORAL
  Filled 2014-01-02 (×3): qty 1

## 2014-01-02 MED ORDER — VANCOMYCIN HCL 10 G IV SOLR
2000.0000 mg | INTRAVENOUS | Status: DC
  Administered 2014-01-03 (×2): 2000 mg via INTRAVENOUS
  Filled 2014-01-02 (×2): qty 2000

## 2014-01-02 MED ORDER — FENOFIBRATE 160 MG PO TABS
160.0000 mg | ORAL_TABLET | Freq: Every day | ORAL | Status: DC
  Administered 2014-01-03 – 2014-01-05 (×3): 160 mg via ORAL
  Filled 2014-01-02 (×3): qty 1

## 2014-01-02 NOTE — H&P (Signed)
[facility] History and Physical  Antonio SmothersJoseph G Celi Jr. ZOX:[id number] DOB: 01/09/1951 DOA: 01/02/2014  Referring physician: ED physician PCP: Antonio FalconKALISH, Antonio J, MD   Chief Complaint: back pain  HPI:  Pt is 63 yo male with history of severe AS and s/p bioprosthetic AVR in 2013, combined systolic and diastolic CHF, HTN, HLD, DM, OSA who [facility] with main concern of sudden onset of lower back pain, sharp and constant, felt like pulled muscle and strain, 10/10 in severity at the time it started but now 3/10 in severity and radiating to upper back area, no specific aggravating factors. He explained he called EMS and upon their arrival he was noted to have BP in 90'50's. He also explains his cardiologist recently increased dose of Lasix and dose of muscle relaxant. He has chronic lower extremity swelling and says now is better compared to last week. He denies fevers, chills, no chest pain or shortness of breath, no specific abdominal or urinary concerns. [facility] upon his request.   Assessment and Plan: Active Problems: Hypotension - possibly related to increased dose of Lasix and dehydration - lasix is currently on hold as pt already took today's dose - holding IVF as pt with significant LE edema - Consider starting Lasix in AM - pt given fluids in ED, unknown amount - repeat orthostatic vitals in AM  LE swelling - per pt much better, LE swollen and red, TTP, warm to touch - will place on Vancomycin as cellulitis can not be entirely excluded - defer to primary team in AM to readjust the medical regimen  Combined CHF - lasix on hold due to soft BP - consider restarting in AM if BP tolerates  - daily weights, I's and O's DM - continue Insulin per home medical regimen  Hypokalemia - will supplement and repeat BMP in AM   Radiological Exams on Admission: No results found.   Code Status: Full Family Communication:  Pt at bedside Disposition Plan: Admit for further evaluation     Review of Systems:  Constitutional: Negative for diaphoresis.  HENT: Negative for hearing loss, ear pain, nosebleeds, congestion, sore throat, neck pain, tinnitus and ear discharge.   Eyes: Negative for blurred vision, double vision, photophobia, pain, discharge and redness.  Respiratory: Negative for cough, hemoptysis, sputum production, shortness of breath, wheezing and stridor.   Cardiovascular: Negative for chest pain, palpitations, orthopnea, claudication.  Gastrointestinal: Negative for heartburn, constipation, blood in stool and melena.  Genitourinary: Negative for dysuria, urgency, frequency, hematuria and flank pain.  Musculoskeletal: Negative for myalgias, back pain.  Skin: Negative for itching and rash.  Neurological: Negative for tingling, tremors, sensory change, speech change, focal weakness, loss of consciousness and headaches.  Endo/Heme/Allergies: Negative for environmental allergies and polydipsia. Does not bruise/bleed easily.  Psychiatric/Behavioral: Negative for suicidal ideas. The patient is not nervous/anxious.      Past Medical History  Diagnosis Date  . Hepatitis   . NICM (nonischemic cardiomyopathy)     echo 12/11: EF 45-50%, grade 1 diast dysfxn, moderately severe AS (AVA 0.9, mean 31 mmHg), mild MR;    cath 1/12:  normal cors, AVA 1.0, peak to peak gradiet for AV 31 mmHg, mod-severe AS, EF 35-40%  . 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 Hx MRSA infection     thigh abcess 11/06  . Anxiety   . Aortic stenosis     AVR 11/29/11  .  HTN (hypertension)   . DM2 (diabetes mellitus, type 2)   . HLD (hyperlipidemia)   . Gout   . Chronic diastolic heart failure     Echo 09/2012: mod LVH, EF 55-65%, Gr 1 DD, AVR ok, mild LAE.    Past Surgical History  Procedure Laterality Date  . Laminectomy  10/30/83  . Aortic valve replacement  11/29/2011    Procedure: AORTIC VALVE REPLACEMENT (AVR);   Surgeon: [name], MD;  Location: [facility] OR;  Service: Open Heart Surgery;  Laterality: N/A;  with nitric oxide    Social History:  reports that he has never smoked. He does not have any smokeless tobacco history on file. His alcohol and drug histories are not on file.  No Known Allergies  Family History  Problem Relation Age of Onset  . Heart attack Father     Prior to Admission medications   Medication Sig Start Date End Date Taking? Authorizing Provider  aspirin 325 MG tablet Take 325 mg by mouth daily.    Historical Provider, MD  atorvastatin (LIPITOR) 40 MG tablet Take 40 mg by mouth daily.    Historical Provider, MD  carisoprodol (SOMA) 350 MG tablet Take 350 mg by mouth 4 (four) times daily as needed for muscle spasms.    Historical Provider, MD  carvedilol (COREG) 6.25 MG tablet Take 6.25 mg by mouth 2 (two) times daily with a meal.    Historical Provider, MD  colchicine 0.6 MG tablet Take 0.6 mg by mouth 2 (two) times daily. gout    Historical Provider, MD  cyclobenzaprine (FLEXERIL) 10 MG tablet Take 10 mg by mouth 3 (three) times daily as needed for muscle spasms.    Historical Provider, MD  escitalopram (LEXAPRO) 10 MG tablet Take 10 mg by mouth daily.    Historical Provider, MD  ezetimibe (ZETIA) 10 MG tablet Take 10 mg by mouth daily.    Historical Provider, MD  febuxostat (ULORIC) 40 MG tablet Take 40 mg by mouth daily.    Historical Provider, MD  fenofibrate (TRICOR) 145 MG tablet Take 145 mg by mouth daily.    Historical Provider, MD  furosemide (LASIX) 80 MG tablet Take 1 tablet (80 mg total) by mouth as directed. on 01/01/14 take 160mg  in the morning and 80mg  in the afternoon 01/01/14   Beatrice Lecher, PA-C  glimepiride (AMARYL) 2 MG tablet Take 2 mg by mouth daily before breakfast.    Historical Provider, MD  insulin glargine (LANTUS) 100 UNIT/ML injection Inject 40-50 Units into the skin daily. As directed    Historical Provider, MD  lisinopril (PRINIVIL,ZESTRIL) 20  MG tablet Take 20 mg by mouth daily.    Historical Provider, MD  metolazone (ZAROXOLYN) 5 MG tablet 1 TAB FOR THE NEXT 3 DAYS 30 MINUTES BEFORE MORNING LASIX 12/29/13   Beatrice Lecher, PA-C  potassium chloride SA (K-DUR,KLOR-CON) 20 MEQ tablet On the days you take metolazone take an EXTRA 40 meq; otherwise regular dose will be 20 meq twice daily 12/30/13   Beatrice Lecher, PA-C  terbinafine (LAMISIL) 1 % cream Apply 1 application topically 2 (two) times daily.    Historical Provider, MD    Physical Exam: Filed Vitals:   01/02/14 1905  BP: 102/59  Pulse: 73  Temp: 97.5 F (36.4 C)  TempSrc: Oral  Resp: 20  Height: 5\' 11"  (1.803 m)  Weight: 171.1 kg (377 lb 3.3 oz)  SpO2: 96%    Physical Exam  Constitutional: Appears well-developed  and well-nourished. No distress. Morbidly obese  HENT: Normocephalic. External right and left ear normal. Oropharynx is clear and moist.  Eyes: Conjunctivae and EOM are normal. PERRLA, no scleral icterus.  Neck: Normal ROM. Neck supple. No JVD. No tracheal deviation. No thyromegaly.  CVS: RRR, S1/S2 +, SEM 3/6, no gallops, no carotid bruit.  Pulmonary: Effort and breath sounds normal, no stridor, rhonchi, wheezes, rales.  Abdominal: Soft. BS +,  no distension, tenderness, rebound or guarding.  Musculoskeletal: Normal range of motion. LE edema with erythema and TTP, warmth to touch  Lymphadenopathy: No lymphadenopathy noted, cervical, inguinal. Neuro: Alert. Normal reflexes, muscle tone coordination. No cranial nerve deficit. Skin: Skin is warm and dry. No rash noted. Not diaphoretic. No erythema. No pallor.  Psychiatric: Normal mood and affect. Behavior, judgment, thought content normal.   Labs on Admission:  Basic Metabolic Panel:  Recent Labs Lab 12/29/13 1029  NA 137  K 3.4*  CL 96  CO2 30  GLUCOSE 94  BUN 34*  CREATININE 1.4  CALCIUM 9.0   CBC:  Recent Labs Lab 12/29/13 1029  WBC 7.8  NEUTROABS 4.5  HGB 12.7*  HCT 39.0  MCV 80.4  PLT  295.0   EKG: Normal sinus rhythm, no ST/T wave changes  Debbora Presto, MD  [facility] Pager [phone]  If 7PM-7AM, please contact night-coverage [email or web address] Password [facility] 01/02/2014, 7:18 PM

## 2014-01-02 NOTE — Progress Notes (Signed)
Patient was transferred from [facility] via ambulance. Pt is alert and orient. Skin in tact with +3 edema to BLE. Pt place on 2L O2 for having some SOB and diminished lung sounds. IV is located in the rt hand. Pt was placed on telemetry 15 and verified with CMT. Pt oriented to unit, call bell is within reach, and bed is at lowest position. Will continue to monitor.   Chevon Fomby J. Lendell Caprice RN

## 2014-01-02 NOTE — Progress Notes (Signed)
ANTIBIOTIC CONSULT NOTE - INITIAL  Pharmacy Consult for Vancomycin  Indication: Cellulitis  No Known Allergies  Patient Measurements: Height: 5\' 11"  (180.3 cm) Weight: 377 lb 3.3 oz (171.1 kg) IBW/kg (Calculated) : 75.3  Vital Signs: Temp: 97.5 F (36.4 C) (03/07 1905) Temp src: Oral (03/07 1905) BP: 120/71 mmHg (03/07 2019) Pulse Rate: 73 (03/07 1905)  Medical History: Past Medical History  Diagnosis Date  . Hepatitis   . NICM (nonischemic cardiomyopathy)     echo 12/11: EF 45-50%, grade 1 diast dysfxn, moderately severe AS (AVA 0.9, mean 31 mmHg), mild MR;    cath 1/12:  normal cors, AVA 1.0, peak to peak gradiet for AV 31 mmHg, mod-severe AS, EF 35-40%  . 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 Hx MRSA infection     thigh abcess 11/06  . Anxiety   . Aortic stenosis     AVR 11/29/11  . HTN (hypertension)   . DM2 (diabetes mellitus, type 2)   . HLD (hyperlipidemia)   . Gout   . Chronic diastolic heart failure     Echo 09/2012: mod LVH, EF 55-65%, Gr 1 DD, AVR ok, mild LAE.   Assessment: 63 y/o M with lower extremity cellulitis/edema to start vancomycin.   Selected Labs from [facility]  WBC 8.8 Scr 2.24  Goal of Therapy:  Vancomycin trough level 10-15 mcg/ml  Plan:  -Vancomycin 2000 mg IV q24h -Trend WBC, temp, renal function  -Possible dose increase if renal function greatly improves given weight -Drug levels ASAP at steady state  Abran Duke 01/02/2014,11:50 PM

## 2014-01-03 DIAGNOSIS — I959 Hypotension, unspecified: Principal | ICD-10-CM

## 2014-01-03 DIAGNOSIS — E669 Obesity, unspecified: Secondary | ICD-10-CM

## 2014-01-03 DIAGNOSIS — I871 Compression of vein: Secondary | ICD-10-CM

## 2014-01-03 DIAGNOSIS — M109 Gout, unspecified: Secondary | ICD-10-CM

## 2014-01-03 DIAGNOSIS — I10 Essential (primary) hypertension: Secondary | ICD-10-CM

## 2014-01-03 LAB — BASIC METABOLIC PANEL
BUN: 100 mg/dL — ABNORMAL HIGH (ref 6–23)
CO2: 27 meq/L (ref 19–32)
Calcium: 9 mg/dL (ref 8.4–10.5)
Chloride: 95 mEq/L — ABNORMAL LOW (ref 96–112)
Creatinine, Ser: 3 mg/dL — ABNORMAL HIGH (ref 0.50–1.35)
GFR calc Af Amer: 24 mL/min — ABNORMAL LOW (ref >90)
GFR calc non Af Amer: 21 mL/min — ABNORMAL LOW (ref >90)
GLUCOSE: 154 mg/dL — AB (ref 70–99)
POTASSIUM: 4.1 meq/L (ref 3.7–5.3)
Sodium: 141 mEq/L (ref 137–147)

## 2014-01-03 LAB — CBC
HCT: 38.1 % — ABNORMAL LOW (ref 39.0–52.0)
Hemoglobin: 12.4 g/dL — ABNORMAL LOW (ref 13.0–17.0)
MCH: 26.9 pg (ref 26.0–34.0)
MCHC: 32.5 g/dL (ref 30.0–36.0)
MCV: 82.6 fL (ref 78.0–100.0)
Platelets: 256 10*3/uL (ref 150–400)
RBC: 4.61 MIL/uL (ref 4.22–5.81)
RDW: 14.9 % (ref 11.5–15.5)
WBC: 9.7 10*3/uL (ref 4.0–10.5)

## 2014-01-03 LAB — HEMOGLOBIN A1C
HEMOGLOBIN A1C: 8.3 % — AB (ref <5.7)
Mean Plasma Glucose: 192 mg/dL — ABNORMAL HIGH (ref <117)

## 2014-01-03 LAB — TSH: TSH: 3.305 u[IU]/mL (ref 0.350–4.500)

## 2014-01-03 LAB — TROPONIN I
Troponin I: <0.3 ng/mL (ref <0.30)
Troponin I: <0.3 ng/mL (ref <0.30)

## 2014-01-03 NOTE — Progress Notes (Signed)
[facility] PROGRESS NOTE   Antonio [name]. ECX:[id number] DOB: 1951-03-20 DOA: 01/02/2014 PCP: [name], MD  HPI/Subjective: Denies shortness of breath, denies chest pain has minimal back pain.  Assessment/Plan: Active Problems:   Hypotension   Hypotension  - possibly related to increased dose of Lasix and dehydration  - lasix is currently on hold as pt already took today's dose  - holding IVF as pt with significant LE edema  - Consider starting Lasix in AM  - pt given fluids in ED, unknown amount  - repeat orthostatic vitals in AM   Acute renal failure -Baseline creatinine is 1.4 on 12/29/2013 -Came in with BUN of 100 and creatinine of 3.0, hold diuretics for now. -Hold other nephrotoxic medications including lisinopril. -Check BMP in [name].m., hold on hydrating with IV fluids as patient has chronic CHF.  LE swelling  - per pt much better, LE swollen and red, TTP, warm to touch  - will place on Vancomycin as cellulitis can not be entirely excluded  - defer to primary team in AM to readjust the medical regimen   Combined CHF  - lasix on hold due to soft BP  - consider restarting in AM if BP tolerates  - daily weights, I's and O's   DM  - continue Insulin per home medical regimen   Hypokalemia  - will supplement and repeat BMP in AM   Code Status: Full code Family Communication: Plan discussed with the patient. Disposition Plan: Remains inpatient   Consultants:  None  Procedures:  None  Antibiotics:  None   Objective: Filed Vitals:   01/03/14 0540  BP: 92/60  Pulse: 79  Temp: 97.4 F (36.3 C)  Resp: 20    Intake/Output Summary (Last 24 hours) at 01/03/14 1226 Last data filed at 01/03/14 1030  Gross per 24 hour  Intake    480 ml  Output      0 ml  Net    480 ml   Filed Weights   01/02/14 1905 01/03/14 0540  Weight: 171.1 kg (377 lb 3.3 oz) 174.2 kg (384 lb 0.7 oz)    Exam: General: Alert and awake, oriented x3, not in  any acute distress. HEENT: anicteric sclera, pupils reactive to light and accommodation, EOMI CVS: S1-S2 clear, no murmur rubs or gallops Chest: clear to auscultation bilaterally, no wheezing, rales or rhonchi Abdomen: soft nontender, nondistended, normal bowel sounds, no organomegaly Extremities: no cyanosis, clubbing or edema noted bilaterally Neuro: Cranial nerves II-XII intact, no focal neurological deficits  Data Reviewed: Basic Metabolic Panel:  Recent Labs Lab 12/29/13 1029 01/03/14 0130  NA 137 141  K 3.4* 4.1  CL 96 95*  CO2 30 27  GLUCOSE 94 154*  BUN 34* 100*  CREATININE 1.4 3.00*  CALCIUM 9.0 9.0   Liver Function Tests: No results found for this basename: AST, ALT, ALKPHOS, BILITOT, PROT, ALBUMIN,  in the last 168 hours No results found for this basename: LIPASE, AMYLASE,  in the last 168 hours No results found for this basename: AMMONIA,  in the last 168 hours CBC:  Recent Labs Lab 12/29/13 1029 01/03/14 0130  WBC 7.8 9.7  NEUTROABS 4.5  --   HGB 12.7* 12.4*  HCT 39.0 38.1*  MCV 80.4 82.6  PLT 295.0 256   Cardiac Enzymes:  Recent Labs Lab 01/02/14 2112 01/03/14 0130 01/03/14 0755  TROPONINI <0.30 <0.30 <0.30   BNP (last 3 results)  Recent Labs  02/25/13 1630 01/02/14 2112  PROBNP  42.0 73.6   CBG:  Recent Labs Lab 01/02/14 2058  GLUCAP 107*    Micro Recent Results (from the past 240 hour(s))  MRSA PCR SCREENING     Status: None   Collection Time    01/02/14  7:44 PM      Result Value Ref Range Status   MRSA by PCR NEGATIVE  NEGATIVE Final   Comment:            The GeneXpert MRSA Assay (FDA     approved for NASAL specimens     only), is one component of [name]     comprehensive MRSA colonization     surveillance program. It is not     intended to diagnose MRSA     infection nor to guide or     monitor treatment for     MRSA infections.     Studies: No results found.  Scheduled Meds: . aspirin  325 mg Oral Daily  .  atorvastatin  40 mg Oral Daily  . colchicine  0.6 mg Oral BID  . enoxaparin (LOVENOX) injection  80 mg Subcutaneous Q24H  . escitalopram  10 mg Oral Daily  . ezetimibe  10 mg Oral Daily  . febuxostat  40 mg Oral Daily  . fenofibrate  160 mg Oral Daily  . insulin glargine  100 Units Subcutaneous Daily  . lisinopril  20 mg Oral Daily  . sodium chloride  3 mL Intravenous Q12H  . vancomycin  2,000 mg Intravenous Q24H   Continuous Infusions:      Time spent: 35 minutes    [facility] Hospital[facility]MA[facility],Antonio [name]  [facility] Pager [phone] If 7PM-7AM, please contact night-coverage at [email or web address], password [email or web address] 01/03/2014, 12:26 PM  LOS: 1 day

## 2014-01-04 ENCOUNTER — Other Ambulatory Visit: Payer: BC Managed Care – PPO

## 2014-01-04 DIAGNOSIS — I517 Cardiomegaly: Secondary | ICD-10-CM

## 2014-01-04 DIAGNOSIS — M7989 Other specified soft tissue disorders: Secondary | ICD-10-CM

## 2014-01-04 LAB — BASIC METABOLIC PANEL
BUN: 85 mg/dL — ABNORMAL HIGH (ref 6–23)
CO2: 28 mEq/L (ref 19–32)
Calcium: 8.9 mg/dL (ref 8.4–10.5)
Chloride: 101 mEq/L (ref 96–112)
Creatinine, Ser: 1.89 mg/dL — ABNORMAL HIGH (ref 0.50–1.35)
GFR calc Af Amer: 42 mL/min — ABNORMAL LOW (ref >90)
GFR calc non Af Amer: 36 mL/min — ABNORMAL LOW (ref >90)
Glucose, Bld: 80 mg/dL (ref 70–99)
Potassium: 4.1 mEq/L (ref 3.7–5.3)
Sodium: 142 mEq/L (ref 137–147)

## 2014-01-04 LAB — CBC
HCT: 37.4 % — ABNORMAL LOW (ref 39.0–52.0)
Hemoglobin: 12 g/dL — ABNORMAL LOW (ref 13.0–17.0)
MCH: 26.5 pg (ref 26.0–34.0)
MCHC: 32.1 g/dL (ref 30.0–36.0)
MCV: 82.7 fL (ref 78.0–100.0)
PLATELETS: 278 10*3/uL (ref 150–400)
RBC: 4.52 MIL/uL (ref 4.22–5.81)
RDW: 14.8 % (ref 11.5–15.5)
WBC: 7.1 10*3/uL (ref 4.0–10.5)

## 2014-01-04 LAB — GLUCOSE, CAPILLARY
GLUCOSE-CAPILLARY: 155 mg/dL — AB (ref 70–99)
GLUCOSE-CAPILLARY: 172 mg/dL — AB (ref 70–99)

## 2014-01-04 LAB — PHOSPHORUS: Phosphorus: 3.7 mg/dL (ref 2.3–4.6)

## 2014-01-04 LAB — MAGNESIUM: Magnesium: 2.9 mg/dL — ABNORMAL HIGH (ref 1.5–2.5)

## 2014-01-04 MED ORDER — FUROSEMIDE 80 MG PO TABS
80.0000 mg | ORAL_TABLET | Freq: Two times a day (BID) | ORAL | Status: DC
  Administered 2014-01-04 – 2014-01-05 (×3): 80 mg via ORAL
  Filled 2014-01-04 (×4): qty 1

## 2014-01-04 MED ORDER — CARVEDILOL 3.125 MG PO TABS
3.1250 mg | ORAL_TABLET | Freq: Two times a day (BID) | ORAL | Status: DC
  Administered 2014-01-04 – 2014-01-05 (×3): 3.125 mg via ORAL
  Filled 2014-01-04 (×4): qty 1

## 2014-01-04 MED ORDER — INSULIN ASPART 100 UNIT/ML ~~LOC~~ SOLN
0.0000 [IU] | Freq: Three times a day (TID) | SUBCUTANEOUS | Status: DC
  Administered 2014-01-04: 2 [IU] via SUBCUTANEOUS
  Administered 2014-01-05: 1 [IU] via SUBCUTANEOUS
  Administered 2014-01-05 (×2): 2 [IU] via SUBCUTANEOUS

## 2014-01-04 NOTE — Progress Notes (Signed)
  Echocardiogram 2D Echocardiogram has been performed.  Zabdi Mis 01/04/2014, 1:19 PM

## 2014-01-04 NOTE — Progress Notes (Signed)
VASCULAR LAB PRELIMINARY  PRELIMINARY  PRELIMINARY  PRELIMINARY  Bilateral lower extremity venous Dopplers completed.    Preliminary report:  There is no DVT or SVT noted in the bilateral lower extremities.  Burgandy Hackworth, RVT 01/04/2014, 1:35 PM

## 2014-01-04 NOTE — Progress Notes (Addendum)
PROGRESS NOTE  Antonio [name]. [id number] DOB: 1950-12-25 DOA: 01/02/2014 PCP: [name], MD  HPI/Subjective: 63 yo male admitted 3/7 w/Lower back pain radiating to chest and hypotension. Denies SOB/chest pain/ palpatations. Pain improved with dilauded. No other complaints today. LE edema improving.   Assessment/Plan:  Hypotension  - resolved, possible resume Lasix today. Trial 80 mg bid. - possibly related to increased dose of Lasix and dehydration  - holding IVF as pt has retained abdominal fluid and b/l LE edema  - orthostatics ordered daily. - PT consult   Acute renal failure  -Baseline creatinine is 1.4 on 12/29/2013  -Came in with BUN of 100 and creatinine of 3.0, hold diuretics for now. BUN improved to 85 on 3/9. Creatinine improved to 1.89 on 3/9. -Hold other nephrotoxic medications including lisinopril.  -monitor BMP -hold on hydrating with IV fluids as patient has chronic CHF.    LE swelling  - per pt much better, LE swollen and red, TTP, warm to touch  -possibly resume Lasix today as BP improving -Vancomycin d/c'd 3/9. 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  - daily weights, I & Os, limit salt, Cardiology made aware of his admission. (but not officially consulted) -Restarting lasix 01/04/14 80 mg bid. -Add back lower dose carvedilol  DM  - Hgb A1C is 8.3 - continue lantus 100 u daily - Add sliding scale and cbgs.  Hypokalemia  - resolved   DVT Prophylaxis: lovenox   Code Status: full Family Communication: none at bedside  Disposition Plan: remain inpatient   Objective: Filed Vitals:   01/04/14 0454 01/04/14 0953 01/04/14 0954 01/04/14 0957  BP: 119/69 105/53 137/80 140/74  Pulse: 86 95 94 102  Temp: 98.2 F (36.8 C) 97.8 F (36.6 C)    TempSrc: Oral Oral    Resp: 18 20      Height:      Weight: 170.099 kg (375 lb)     SpO2: 90% 93% 93% 94%    Intake/Output Summary (Last 24 hours) at 01/04/14 1043 Last data filed at 01/04/14 0900  Gross per 24 hour  Intake   1700 ml  Output      0 ml  Net   1700 ml   Filed Weights   01/02/14 1905 01/03/14 0540 01/04/14 0454  Weight: 171.1 kg (377 lb 3.3 oz) 174.2 kg (384 lb 0.7 oz) 170.099 kg (375 lb)    Exam: General: Well developed, obese , NAD, appears stated age. HEENT:  PERR, No  pharyngeal erythema or exudates  Neck: Supple, no JVD, no masses  Cardiovascular: RRR, S1 S2 auscultated, no rubs, murmurs or gallops.   Respiratory: Clear to auscultation bilaterally with equal chest rise  Abdomen: distended , non tender, + bowel sounds extemities: warm dry without 3+ edema in LE and hands b/l Neuro: AAOx3,  Muskuloskeltal: pain to palpation in R lower back Skin: Without rashes exudates or nodules.   Psych: Normal affect and demeanor with intact judgement and insight   Data Reviewed: Basic Metabolic Panel:  Recent Labs Lab 12/29/13 1029 01/03/14 0130 01/04/14 0535  NA 137 141 142  K 3.4* 4.1 4.1  CL 96 95* 101  CO2 30 27 28   GLUCOSE 94 154* 80  BUN 34* 100* 85*  CREATININE 1.4 3.00* 1.89*  CALCIUM 9.0 9.0 8.9  MG  --   --  2.9*  PHOS  --   --  3.7   CBC:  Recent Labs Lab 12/29/13 1029 01/03/14 0130 01/04/14 0535  WBC 7.8 9.7 7.1  NEUTROABS 4.5  --   --   HGB 12.7* 12.4* 12.0*  HCT 39.0 38.1* 37.4*  MCV 80.4 82.6 82.7  PLT 295.0 256 278   Cardiac Enzymes:  Recent Labs Lab 01/02/14 2112 01/03/14 0130 01/03/14 0755  TROPONINI <0.30 <0.30 <0.30   BNP (last 3 results)  Recent Labs  02/25/13 1630 01/02/14 2112  PROBNP 42.0 73.6   CBG:  Recent Labs Lab 01/02/14 2058  GLUCAP 107*    Recent Results (from the past 240 hour(s))  MRSA PCR SCREENING     Status: None   Collection Time    01/02/14  7:44 PM      Result Value Ref Range Status   MRSA by PCR NEGATIVE  NEGATIVE  Final   Comment:            The GeneXpert MRSA Assay (FDA     approved for NASAL specimens     only), is one component of a     comprehensive MRSA colonization     surveillance program. It is not     intended to diagnose MRSA     infection nor to guide or     monitor treatment for     MRSA infections.     Studies: No results found.  Scheduled Meds: . aspirin  325 mg Oral Daily  . atorvastatin  40 mg Oral Daily  . colchicine  0.6 mg Oral BID  . enoxaparin (LOVENOX) injection  80 mg Subcutaneous Q24H  . escitalopram  10 mg Oral Daily  . ezetimibe  10 mg Oral Daily  . febuxostat  40 mg Oral Daily  . fenofibrate  160 mg Oral Daily  . insulin glargine  100 Units Subcutaneous Daily  . sodium chloride  3 mL Intravenous Q12H  . vancomycin  2,000 mg Intravenous Q24H    Rayetta PiggJackie Kocot, PA-S Algis DownsMarianne York PA-C [facility] Pager [phone]. If 7PM-7AM, please contact night-coverage at [email or web address], password [email or web address] 01/04/2014, 10:43 AM  LOS: 2 days     Addendum  Patient seen and examined, chart and data base reviewed.  I agree with the above assessment and plan.  For full details please see Mrs. Algis DownsMarianne York PA note.  The above note is reviewed and addended as needed.   Clint LippsMutaz A Shantoria Ellwood, MD [facility] Pager: [phone] 01/04/2014, 3:47 PM

## 2014-01-05 ENCOUNTER — Ambulatory Visit: Payer: BC Managed Care – PPO | Admitting: Physician Assistant

## 2014-01-05 DIAGNOSIS — R9439 Abnormal result of other cardiovascular function study: Secondary | ICD-10-CM

## 2014-01-05 DIAGNOSIS — E1165 Type 2 diabetes mellitus with hyperglycemia: Secondary | ICD-10-CM

## 2014-01-05 DIAGNOSIS — I429 Cardiomyopathy, unspecified: Secondary | ICD-10-CM

## 2014-01-05 DIAGNOSIS — IMO0001 Reserved for inherently not codable concepts without codable children: Secondary | ICD-10-CM

## 2014-01-05 LAB — GLUCOSE, CAPILLARY
GLUCOSE-CAPILLARY: 159 mg/dL — AB (ref 70–99)
Glucose-Capillary: 174 mg/dL — ABNORMAL HIGH (ref 70–99)
Glucose-Capillary: 136 mg/dL — ABNORMAL HIGH (ref 70–99)

## 2014-01-05 LAB — BASIC METABOLIC PANEL
BUN: 65 mg/dL — ABNORMAL HIGH (ref 6–23)
CALCIUM: 9 mg/dL (ref 8.4–10.5)
CO2: 28 mEq/L (ref 19–32)
Chloride: 100 mEq/L (ref 96–112)
Creatinine, Ser: 1.62 mg/dL — ABNORMAL HIGH (ref 0.50–1.35)
GFR, EST AFRICAN AMERICAN: 51 mL/min — AB (ref >90)
GFR, EST NON AFRICAN AMERICAN: 44 mL/min — AB (ref >90)
GLUCOSE: 180 mg/dL — AB (ref 70–99)
POTASSIUM: 4.1 meq/L (ref 3.7–5.3)
SODIUM: 142 meq/L (ref 137–147)

## 2014-01-05 LAB — MAGNESIUM: MAGNESIUM: 2.9 mg/dL — AB (ref 1.5–2.5)

## 2014-01-05 MED ORDER — POTASSIUM CHLORIDE CRYS ER 20 MEQ PO TBCR: EXTENDED_RELEASE_TABLET | ORAL | Status: DC

## 2014-01-05 MED ORDER — CETAPHIL MOISTURIZING EX LOTN
TOPICAL_LOTION | Freq: Two times a day (BID) | CUTANEOUS | Status: DC
  Administered 2014-01-05: 19:00:00 via TOPICAL
  Filled 2014-01-05: qty 473

## 2014-01-05 MED ORDER — FUROSEMIDE 80 MG PO TABS: 80.0000 mg | ORAL_TABLET | Freq: Two times a day (BID) | ORAL | Status: DC

## 2014-01-05 NOTE — Progress Notes (Signed)
Cardiac monitoring called and stated the patient was having runs of vtach. RN at bedside, patient is alert and sitting up on the side of the bed. Pt is asymptomatic. While in the room the patient went back to NSR. On call NP notified. The tele strip was looked at and an order for lab to draw a Mg level was given. Will continue to monitor.

## 2014-01-05 NOTE — Discharge Instructions (Signed)
Eating a Low Salt Diet will decrease the fluid in your legs and abdomen. The [facility] should check a BMET (blood draw) in 2-4 days and send it to Dr. Sanjuana Kava to check your lasix dosing.   Keep a daily record of your weight for the Cardiologist.

## 2014-01-05 NOTE — Discharge Summary (Signed)
Addendum  Patient seen and examined, chart and data base reviewed.  I agree with the above assessment and plan.  For full details please see Mrs. Algis Downs PA note.  Hypotension and acute renal failure likely secondary to overdiuresis.  Restarted back on Lasix at the time of discharge, followup with radiology as outpatient.   Clint Lipps, MD [facility] Pager: [phone] 01/05/2014, 6:22 PM

## 2014-01-05 NOTE — Evaluation (Signed)
Physical Therapy Evaluation Patient Details Name: Antonio [name]. MRN: [id number] DOB: [birth year] Today's Date: 01/05/2014 Time: 3888-2800 PT Time Calculation (min): 17 min  PT Assessment / Plan / Recommendation History of Present Illness  Pt adm with hypotension and HF.  Clinical Impression  Pt doing well with mobility and no further PT needed.  Ready for dc from PT standpoint.      PT Assessment  Patent does not need any further PT services    Follow Up Recommendations  No PT follow up    Does the patient have the potential to tolerate intense rehabilitation      Barriers to Discharge        Equipment Recommendations  None recommended by PT    Recommendations for Other Services     Frequency      Precautions / Restrictions Precautions Precautions: None   Pertinent Vitals/Pain vss      Mobility  Bed Mobility Overal bed mobility: Modified Independent Transfers Overall transfer level: Modified independent Ambulation/Gait Ambulation/Gait assistance: Modified independent (Device/Increase time) Ambulation Distance (Feet): 400 Feet Assistive device: Rolling walker (2 wheeled) Gait Pattern/deviations: Step-through pattern;Decreased stride length;Wide base of support    Exercises     PT Diagnosis:    PT Problem List:   PT Treatment Interventions:       PT Goals(Current goals can be found in the care plan section) Acute Rehab PT Goals PT Goal Formulation: No goals set, d/c therapy  Visit Information  Last PT Received On: 01/05/14 Assistance Needed: +1 History of Present Illness: Pt adm with hypotension and HF.       Prior Functioning  Home Living Family/patient expects to be discharged to:: Private residence Living Arrangements: Alone Type of Home: House Home Layout: One level (except 4 steps up to master bedroom) Home Equipment: Dan Humphreys - 2 wheels;Cane - single point Prior Function Level of Independence: Independent with assistive  device(s) Comments: Uses rolling walker Communication Communication: No difficulties    Cognition  Cognition Arousal/Alertness: Awake/alert Behavior During Therapy: WFL for tasks assessed/performed Overall Cognitive Status: Within Functional Limits for tasks assessed    Extremity/Trunk Assessment Upper Extremity Assessment Upper Extremity Assessment: Overall WFL for tasks assessed Lower Extremity Assessment Lower Extremity Assessment: Overall WFL for tasks assessed   Balance Balance Overall balance assessment: Needs assistance Standing balance support: No upper extremity supported Standing balance-Leahy Scale: Good  End of Session PT - End of Session Activity Tolerance: Patient tolerated treatment well Patient left: in bed Nurse Communication: Mobility status  GP     Kamia Insalaco 01/05/2014, 3:13 PM  [facility] PT [phone]

## 2014-01-05 NOTE — Care Management Note (Signed)
    Page 1 of 2   01/05/2014     4:36:51 PM   CARE MANAGEMENT NOTE 01/05/2014  Patient:  Antonio [name], Antonio [name]   Account Number:  [id number]  Date Initiated:  01/05/2014  Documentation initiated by:  Letha Cape  Subjective/Objective Assessment:   dx acute renal failure,hypotension  admit- lives alone.     Action/Plan:   Anticipated DC Date:  01/05/2014   Anticipated DC Plan:  HOME W HOME HEALTH SERVICES      DC Planning Services  CM consult      [facility] Choice  HOME HEALTH   Choice offered to / List presented to:          [facility] arranged  HH-1 RN  HH-10 DISEASE MANAGEMENT      HH agency  [facility]   Status of service:  Completed, signed off Medicare Important Message given?   (If response is "NO", the following Medicare IM given date fields will be blank) Date Medicare IM given:   Date Additional Medicare IM given:    Discharge Disposition:  HOME W HOME HEALTH SERVICES  Per UR Regulation:  Reviewed for med. necessity/level of care/duration of stay  If discussed at Long Length of Stay Meetings, dates discussed:    Comments:  01/05/14 1449 Letha Cape RN, BSN [phone] patient lives alone, per physical therapy recs no pt f/u. NCM has referral for [facility], and aide, patient has [employer] ppo insurance, will check to see who is in network and what the co pay will be for [facility] and aide.  Patient will not be able to get an aide with Managed Care insurance,  [facility] and [facility] are within network for his insurance and he will have to pay $616. Patient chose [facility], per Lupita Leash with [facility] , with this hospital stay he should meet deductiable and then co pay will be around $22, patient has decided to go with [facility].  Referral made to [facility] for [facility], soc will begin 24-48 hrs post discharge.

## 2014-01-05 NOTE — Discharge Summary (Signed)
Physician Discharge Summary  Antonio [name]. EXB:[id number] DOB: 26-Jun-1951 DOA: 01/02/2014  PCP: Antonio Falcon, MD Cardiologist:  Dr. Sanjuana [name]  Admit date: 01/02/2014 Discharge date: 01/05/2014  Time spent: 60 minutes  Recommendations for Outpatient Follow-up:  Home health RN for diastolic heart failure - monitor volume status, educate, draw labs Home health aide for assistance with ADLs as patient is morbidly obese BMET from [facility] in 2-4 days to monitor creatinine on increased dose of lasix.  Please send to cardiologist. Follow up with Cardiology Antonio Newcomer, PA-C) 3/25 at 11:00. For D-HF, Lasix dosing, monitor creatinine.  On going DM management and education.  Patient is non-compliant with diet.   Discharge Diagnoses:  Principal Problem:   Hypotension Active Problems:   Chronic diastolic heart failure   DIABETES MELLITUS, TYPE II, UNCONTROLLED   OBESITY, NOS   Discharge Condition: stable  Diet recommendation: low salt heart healthy  Filed Weights   01/03/14 0540 01/04/14 0454 01/05/14 0436  Weight: 174.2 kg (384 lb 0.7 oz) 170.099 kg (375 lb) 168.4 kg (371 lb 4.1 oz)    History of present illness:  64 yo male with PMH of Diastolic HF, Morbid obesity, DM2, HTN, Aortic Valve replacement - admitted 3/7 w/Lower back pain radiating to chest,  Hypotension, and significant lower extremity edema. Denies SOB/chest pain/ palpatations.   Hospital Course:  Hypotension  - likely due to increased dose of lasix. - Initially all BP meds and diuretics were held - resolved.   Started lasix 80 mg bid. On 01/04/2014 - orthostatics were checked daily.  - Coreg has be restarted, but patient's low normal BP will not tolerate and Ace I in addition to Coreg.  Acute renal failure  -Baseline creatinine is 1.4 on 12/29/2013  -Came in with BUN of 100 and creatinine of 3.0, diuretics and nephrotoxic meds were held. -Creatinine has improved almost back to baseline.  -monitor BMP as outpatient  in 2-4 days.  LE swelling  -Secondary to acute systolic/diastolic heart failure. - improved with lasix and elevation. - patient educated on low salt diet. - Bilateral doppler ultrasound was negative for DVT - Will continue on lasix 80 mg bid.    Combined CHF  - daily weights, I & Os, limit salt, has lost 10 pounds, fluid retension mproved.  -Restarted lasix 01/04/14 80 mg bid.  -restored coreg to 6.25 bid on 3/10. -Patient's low normal BP will not tolerate an ACE I  DM  - Hgb A1C is 8.3  - continue lantus 100 u daily  - restart glimiperide at discharge.   Hypokalemia  - likely due to lasix.  - resolved with supplementation.   Consultations:  Cardiology  Discharge Exam: Filed Vitals:   01/05/14 1302  BP: 109/64  Pulse: 80  Temp: 98.2 F (36.8 C)  Resp: 20   General: Well developed, obese , NAD, appears stated age. Eating breakfast. HEENT: PERR, No pharyngeal erythema or exudates  Neck: Supple, no JVD, no masses  Cardiovascular: RRR, S1 S2 auscultated, 2/6 systolic murmur noted. Respiratory: Clear to auscultation bilaterally with equal chest rise  Abdomen: distended , non tender, + bowel sounds, unable to appreciate masses secondary to obese body habitus extemities: warm dry with 1+ edema in LE and hands b/l. Improved from yesterday  Neuro: AAOx3,  Muskuloskeltal: pain to palpation in R lower back  Skin: Without rashes exudates or nodules. Very dry.  Psych: Normal affect and demeanor with intact judgement and insight   Discharge Instructions  Discharge Orders   Future Appointments Provider Department Dept Phone   01/20/2014 11:10 AM Antonio Lecher, PA-C [facility] [phone]   Future Orders Complete By Expires   Diet - low sodium heart healthy  As directed    Increase activity slowly  As directed        Medication List    STOP taking these medications       lisinopril 20 MG tablet  Commonly known as:  PRINIVIL,ZESTRIL      metolazone 5 MG tablet  Commonly known as:  ZAROXOLYN      TAKE these medications       aspirin 325 MG tablet  Take 325 mg by mouth daily.     atorvastatin 40 MG tablet  Commonly known as:  LIPITOR  Take 40 mg by mouth daily.     carisoprodol 350 MG tablet  Commonly known as:  SOMA  Take 350 mg by mouth 4 (four) times daily as needed for muscle spasms.     carvedilol 6.25 MG tablet  Commonly known as:  COREG  Take 6.25 mg by mouth 2 (two) times daily with a meal.     colchicine 0.6 MG tablet  Take 0.6 mg by mouth 2 (two) times daily. gout     cyclobenzaprine 10 MG tablet  Commonly known as:  FLEXERIL  Take 10 mg by mouth 3 (three) times daily as needed for muscle spasms.     escitalopram 10 MG tablet  Commonly known as:  LEXAPRO  Take 10 mg by mouth daily.     ezetimibe 10 MG tablet  Commonly known as:  ZETIA  Take 10 mg by mouth daily.     febuxostat 40 MG tablet  Commonly known as:  ULORIC  Take 40 mg by mouth daily.     fenofibrate 145 MG tablet  Commonly known as:  TRICOR  Take 145 mg by mouth daily.     furosemide 80 MG tablet  Commonly known as:  LASIX  Take 1 tablet (80 mg total) by mouth 2 (two) times daily. on 01/01/14 take 160mg  in the morning and 80mg  in the afternoon     glimepiride 2 MG tablet  Commonly known as:  AMARYL  Take 2 mg by mouth daily before breakfast.     insulin glargine 100 UNIT/ML injection  Commonly known as:  LANTUS  Inject 100 Units into the skin every morning. As directed     potassium chloride SA 20 MEQ tablet  Commonly known as:  K-DUR,KLOR-CON  On the days you take metolazone take an EXTRA 40 meq; otherwise regular dose will be 20 meq twice daily     terbinafine 1 % cream  Commonly known as:  LAMISIL  Apply 1 application topically 2 (two) times daily as needed (outbreak).       No Known Allergies    The results of significant diagnostics from this hospitalization (including imaging, microbiology, ancillary and  laboratory) are listed below for reference.    Significant Diagnostic Studies: No results found.  Microbiology: Recent Results (from the past 240 hour(s))  MRSA PCR SCREENING     Status: None   Collection Time    01/02/14  7:44 PM      Result Value Ref Range Status   MRSA by PCR NEGATIVE  NEGATIVE Final   Comment:            The GeneXpert MRSA Assay (FDA     approved for NASAL  specimens     only), is one component of a     comprehensive MRSA colonization     surveillance program. It is not     intended to diagnose MRSA     infection nor to guide or     monitor treatment for     MRSA infections.     Labs: Basic Metabolic Panel:  Recent Labs Lab 01/03/14 0130 01/04/14 0535 01/05/14 0615  NA 141 142 142  K 4.1 4.1 4.1  CL 95* 101 100  CO2 27 28 28   GLUCOSE 154* 80 180*  BUN 100* 85* 65*  CREATININE 3.00* 1.89* 1.62*  CALCIUM 9.0 8.9 9.0  MG  --  2.9* 2.9*  PHOS  --  3.7  --    CBC:  Recent Labs Lab 01/03/14 0130 01/04/14 0535  WBC 9.7 7.1  HGB 12.4* 12.0*  HCT 38.1* 37.4*  MCV 82.6 82.7  PLT 256 278   Cardiac Enzymes:  Recent Labs Lab 01/02/14 2112 01/03/14 0130 01/03/14 0755  TROPONINI <0.30 <0.30 <0.30   BNP: BNP (last 3 results)  Recent Labs  02/25/13 1630 01/02/14 2112  PROBNP 42.0 73.6   CBG:  Recent Labs Lab 01/02/14 2058 01/04/14 1652 01/04/14 2221 01/05/14 0751 01/05/14 1133  GLUCAP 107* 155* 172* 159* 174*       Signed:  Conley CanalYork, Shenetta Schnackenberg L, PA-C [phone]  [facility] 01/05/2014, 1:48 PM

## 2014-01-05 NOTE — Consult Note (Signed)
CARDIOLOGY CONSULT NOTE   Patient ID: Antonio [name]. MRN: [id number], DOB/AGE: January 31, 1951   Admit date: 01/02/2014 Date of Consult: 01/05/2014   Primary Physician: [name], MD Primary Cardiologist: Dr. Clifton James  Pt. Profile  63 year old gentleman with prior aortic valve replacement with bioprosthetic valve admitted with fluid overload on 01/02/14.  Cardiology consulted regarding question of runs of ventricular tachycardia at 4 AM this morning.  Problem List  Past Medical History  Diagnosis Date  . Hepatitis   . NICM (nonischemic cardiomyopathy)     echo 12/11: EF 45-50%, grade 1 diast dysfxn, moderately severe AS (AVA 0.9, mean 31 mmHg), mild MR;    cath 1/12:  normal cors, AVA 1.0, peak to peak gradiet for AV 31 mmHg, mod-severe AS, EF 35-40%  . 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 Hx MRSA infection     thigh abcess 11/06  . Anxiety   . Aortic stenosis     AVR 11/29/11  . HTN (hypertension)   . DM2 (diabetes mellitus, type 2)   . HLD (hyperlipidemia)   . Gout   . Chronic diastolic heart failure     Echo 09/2012: mod LVH, EF 55-65%, Gr 1 DD, AVR ok, mild LAE.    Past Surgical History  Procedure Laterality Date  . Laminectomy  10/30/83  . Aortic valve replacement  11/29/2011    Procedure: AORTIC VALVE REPLACEMENT (AVR);  Surgeon: [name], MD;  Location: [facility] OR;  Service: Open Heart Surgery;  Laterality: N/A;  with nitric oxide     Allergies  No Known Allergies  HPI  This 63 year old gentleman has a history of bioprosthetic aortic valve replacement in 2013.  His surgeon is Dr. Donata Clay.  He had a prolonged hospital course postoperatively because of severe fluid retention.  He was seen back in Dr. Gibson Ramp  office by [name] PA C. on 12/29/13 at which time he is complaining of increased peripheral edema and his diuretics were increased and he was placed on metolazone 5 mg daily for the next 3 days.  He subsequently developed problems  with increased weakness and hypotension and worsening renal function and was admitted on the hospitalist service on 01/02/14.  Since admission his monitor had been stable until early this morning when he had what was initially interpreted as ventricular tachycardia.  The patient was sitting on the side of the bed having just voided at the time of the onset of the apparent arrhythmia and he felt fine and was asymptomatic from a cardiac standpoint.  Denied any chest pain or increased dyspnea.  Inpatient Medications  . aspirin  325 mg Oral Daily  . atorvastatin  40 mg Oral Daily  . carvedilol  3.125 mg Oral BID WC  . colchicine  0.6 mg Oral BID  . enoxaparin (LOVENOX) injection  80 mg Subcutaneous Q24H  . escitalopram  10 mg Oral Daily  . ezetimibe  10 mg Oral Daily  . febuxostat  40 mg Oral Daily  . fenofibrate  160 mg Oral Daily  . furosemide  80 mg Oral BID  . insulin aspart  0-9 Units Subcutaneous TID WC  . insulin glargine  100 Units Subcutaneous Daily  . sodium chloride  3 mL Intravenous Q12H    Family History Family History  Problem Relation Age of Onset  . Heart attack Father      Social History History   Social History  . Marital Status:  Legally Separated    Spouse Name: N/A    Number of Children: N/A  . Years of Education: N/A   Occupational History  . retired travelling Engineer, siteschool teacher    Social History Main Topics  . Smoking status: Never Smoker   . Smokeless tobacco: Not on file  . Alcohol Use: Yes     Comment: occassional  . Drug Use: Not on file  . Sexual Activity: Not on file   Other Topics Concern  . Not on file   Social History Narrative  . No narrative on file     Review of Systems  General:  No chills, fever, night sweats or weight changes.  Cardiovascular:  No chest pain, dyspnea on exertion,  orthopnea, palpitations, paroxysmal nocturnal dyspnea.  Significant persistent peripheral edema but improved from admission Dermatological: No rash,  lesions/masses Respiratory: No cough, dyspnea Urologic: No hematuria, dysuria Abdominal:   No nausea, vomiting, diarrhea, bright red blood per rectum, melena, or hematemesis Neurologic:  No visual changes, wkns, changes in mental status. All other systems reviewed and are otherwise negative except as noted above.  Physical Exam  Blood pressure 120/79, pulse 84, temperature 97.8 F (36.6 C), temperature source Oral, resp. rate 20, height 5\' 11"  (1.803 m), weight 371 lb 4.1 oz (168.4 kg), SpO2 94.00%.  General: Pleasant, NAD Psych: Normal affect. Neuro: Alert and oriented X 3. Moves all extremities spontaneously. HEENT: Normal  Neck: Supple without bruits or JVD. Lungs:  Resp regular and unlabored, CTA. Heart: RRR .  No S3 gallop.  There is a soft systolic ejection murmur across the prosthetic aortic valve.  No diastolic murmur.  No pericardial rub Abdomen: Soft, non-tender, non-distended, BS + x 4.  Extremities:  2-3+ pedal and pretibial edema and erythema  Labs   Recent Labs  01/02/14 2112 01/03/14 0130 01/03/14 0755  TROPONINI <0.30 <0.30 <0.30   Lab Results  Component Value Date   WBC 7.1 01/04/2014   HGB 12.0* 01/04/2014   HCT 37.4* 01/04/2014   MCV 82.7 01/04/2014   PLT 278 01/04/2014     Recent Labs Lab 01/05/14 0615  NA 142  K 4.1  CL 100  CO2 28  BUN 65*  CREATININE 1.62*  CALCIUM 9.0  GLUCOSE 180*   Lab Results  Component Value Date   CHOL 112 11/27/2011   HDL 26* 11/27/2011   LDLCALC 54 11/27/2011   TRIG 161* 11/27/2011   No results found for this basename: DDIMER    Radiology/Studies  No results found. 2-D echo: 01/04/14 - Left ventricle: LV systolic function is grossly normal. The cavity size was normal. Wall thickness was increased in a pattern of mild LVH. - Left atrium: The atrium was moderately to severely dilated. - Right ventricle: RV systolic function is grossly normal. Impressions:  - Extremely poor acoustic windows limit study. There did  not appear to be any excessive turbulence across the aortic valve     ECG  12-lead EKG pending  ASSESSMENT AND PLAN  I reviewed the available telemetry strips.  There is no evidence of ventricular tachycardia.  This was a pseudo-arrhythmia probably secondary to a loose electrode.  Subsequently his rhythm has remained normal sinus rhythm.  Recommendation: Continue beta blocker (carvedilol). Continue attempts at careful diuresis while keeping a close eye on renal function.  As noted in the past this is a difficult balance to achieve.  Will follow at a distance.  Signed, Cassell Clementhomas Laurelin Elson, MD  01/05/2014, 11:49 AM

## 2014-01-08 ENCOUNTER — Telehealth: Payer: Self-pay | Admitting: Physician Assistant

## 2014-01-08 NOTE — Telephone Encounter (Signed)
New message    [facility] calling has some questions regarding patient.

## 2014-01-08 NOTE — Telephone Encounter (Signed)
[name] from [facility] calling today to verify some direction for lasix. Pt saw Antonio [name], PAC on 12/29/13 and was advised at visit to increase lasix to 160 mg in the morning and 80 mg in the pm, with a STAT bmet on Friday 01/01/14, which pt did not come in for. Pt was admitted on 01/02/14 to [facility], d/c'd from [facility] on 01/05/14 with new directions for lasix of 80 mg BID. Antonio [name] states that pt is telling her that lasix instructions on d/c are wrong because Antonio [name], [facility] put him lasix 160 mg Am and 80 mg PM. [name] and I went over lab results and d/c note and Antonio W. PAC ov note. [name] and I agreed to follow [facility] d/c instructions until I can d/w [name]. PA to see if he agrees with the d/c instructions. I will give [name]. PAC [name]'s cell # [phone] to call her to discuss further.  01/05/14 labs show BUN 65, Creatinine 1.62.

## 2014-01-11 NOTE — Telephone Encounter (Signed)
LMTCB ./CY 

## 2014-01-11 NOTE — Telephone Encounter (Signed)
Medications were adjusted in the hospital.  Continue with dose of Lasix prescribed at d/c (80 mg bid). [name], PA-C   01/11/2014 1:44 PM

## 2014-01-14 ENCOUNTER — Telehealth: Payer: Self-pay | Admitting: *Deleted

## 2014-01-14 DIAGNOSIS — I5032 Chronic diastolic (congestive) heart failure: Secondary | ICD-10-CM

## 2014-01-14 MED ORDER — FUROSEMIDE 80 MG PO TABS: 80.0000 mg | ORAL_TABLET | Freq: Two times a day (BID) | ORAL | Status: DC

## 2014-01-14 NOTE — Telephone Encounter (Signed)
lmptcb about lasix dose, that he is to follow the d/c summary dose of lasix at this time. Asked ptcb to let us know that he did recieve this message  since we hav ebeen trying to reach him.

## 2014-01-14 NOTE — Telephone Encounter (Signed)
Ptcb right after I lmptcb. I advised pt that he is to be on lasix 80 mg bid per the [facility] d/c summary, which Lorin Picket W. PA agrees this. Pt verbalized understanding to this and verified his appt with [name]. PA 01/20/14 @ 11:10.

## 2014-01-14 NOTE — Telephone Encounter (Signed)
Ptcb right after I lmptcb. I advised pt that he is to be on lasix 80 mg bid per the [facility] d/c summary, which Scott W. PA agrees this. Pt verbalized understanding to this and verified his appt with Scott W. PA 01/20/14 @ 11:10.  

## 2014-01-14 NOTE — Telephone Encounter (Signed)
lmptcb about lasix dose, that he is to follow the d/c summary dose of lasix at this time. Asked ptcb to let us know that he did recieve this message  since we hav ebeen trying to reach him.  

## 2014-01-20 ENCOUNTER — Ambulatory Visit (INDEPENDENT_AMBULATORY_CARE_PROVIDER_SITE_OTHER): Payer: BC Managed Care – PPO | Admitting: Physician Assistant

## 2014-01-20 ENCOUNTER — Encounter: Payer: Self-pay | Admitting: Physician Assistant

## 2014-01-20 VITALS — BP 130/76 | HR 83 | Ht 71.0 in | Wt 375.0 lb

## 2014-01-20 DIAGNOSIS — I5032 Chronic diastolic (congestive) heart failure: Secondary | ICD-10-CM

## 2014-01-20 DIAGNOSIS — I429 Cardiomyopathy, unspecified: Secondary | ICD-10-CM

## 2014-01-20 DIAGNOSIS — I428 Other cardiomyopathies: Secondary | ICD-10-CM

## 2014-01-20 DIAGNOSIS — I359 Nonrheumatic aortic valve disorder, unspecified: Secondary | ICD-10-CM

## 2014-01-20 DIAGNOSIS — Z952 Presence of prosthetic heart valve: Secondary | ICD-10-CM

## 2014-01-20 DIAGNOSIS — I10 Essential (primary) hypertension: Secondary | ICD-10-CM

## 2014-01-20 DIAGNOSIS — N189 Chronic kidney disease, unspecified: Secondary | ICD-10-CM

## 2014-01-20 DIAGNOSIS — Z954 Presence of other heart-valve replacement: Secondary | ICD-10-CM

## 2014-01-20 MED ORDER — LISINOPRIL 20 MG PO TABS
10.0000 mg | ORAL_TABLET | Freq: Every day | ORAL | Status: DC
Start: 2014-01-20 — End: 2015-02-18

## 2014-01-20 NOTE — Progress Notes (Signed)
[address] [place]  [address] Phone: [phone]([phone] Fax:  [phone]) [phone]  Date:  01/20/2014   ID:  Antonio SmothersJoseph G Celi Jr., DOB 10/18/1951, MRN [id number]  PCP:  Antonio FalconKALISH, MICHAEL J, MD  Cardiologist:  Dr. Verne Carrowhristopher [name]     History of Present Illness: Antonio SmothersJoseph G Celi Jr. is a 63 y.o. male with a hx of severe AS s/p bioprosthetic AVR in 10/2011, combined systolic and diastolic CHF, NICM, DM, HTN, HL, OSA.  He did well after AVR but required several more weeks in the hospital for diuresis for volume excess.  He had post op AFib requiring Amiodarone.  EF improved after his AVR.   I saw him 12/29/13 with volume overload. He was up 40+ pounds. We adjusted his diuretics as an outpatient with plans for close follow up.  However, he was admitted 3/7-3/10.  He presented with low-back pain radiating to his chest. He was noted to be hypotensive and had evidence of acute kidney injury.  Medications were adjusted. His renal function improved. He was seen by cardiology. There was a question of ventricular tachycardia on telemetry. However, upon review, there were no ventricular arrhythmias noted. He was placed back on lower dose diuretics at discharge.  He is feeling better. His breathing is better. He denies chest pain, orthopnea, PND. LE edema is improved. He denies syncope.  Studies:  - Echo (05/2011):  EF 45-50%; severe AS (mean 49 mmHg).  - Echo (09/2012):  Mod LVH, EF 55-65%, no RWMA, Gr 1 DD, AVR ok, mild LAE.  - Echo (01/04/14):  Normal LVF, mild LVH, mod to severe LAE, poor acoustic windows; no turbulence through LVOT  - Lexiscan Myoview (10/2010):  IL scar with mild peri-infarct ischemia, EF 34%.  - LHC (10/2010):  No CAD, EF 35-40%; AV peak to peak gradient of 31 mmHg.  - Carotid US (10/2011): No ICA stenosis.  - Holter (01/2012):  NSR; no AFib     Recent Labs: 01/02/2014: Pro B Natriuretic peptide (BNP) 73.6; TSH 3.305  01/04/2014: Hemoglobin 12.0*  01/05/2014: Creatinine 1.62*;  Potassium 4.1 01/19/14 (from PCP):  K 3.8, creatinine 1.3  Wt Readings from Last 3 Encounters:  01/20/14 375 lb (170.099 kg)  01/05/14 371 lb 4.1 oz (168.4 kg)  12/29/13 381 lb (172.82 kg)     Past Medical History  Diagnosis Date  . Hepatitis   . NICM (nonischemic cardiomyopathy)     echo 12/11: EF 45-50%, grade 1 diast dysfxn, moderately severe AS (AVA 0.9, mean 31 mmHg), mild MR;    cath 1/12:  normal cors, AVA 1.0, peak to peak gradiet for AV 31 mmHg, mod-severe AS, EF 35-40%  . 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 Hx MRSA infection     thigh abcess 11/06  . Anxiety   . Aortic stenosis     AVR 11/29/11  . HTN (hypertension)   . DM2 (diabetes mellitus, type 2)   . HLD (hyperlipidemia)   . Gout   . Chronic diastolic heart failure     Echo 09/2012: mod LVH, EF 55-65%, Gr 1 DD, AVR ok, mild LAE.    Current Outpatient Prescriptions  Medication Sig Dispense Refill  . aspirin 325 MG tablet Take 325 mg by mouth daily.      Marland Kitchen. atorvastatin (LIPITOR) 40 MG tablet Take 40 mg by mouth daily.      . carisoprodol (SOMA) 350 MG tablet Take 350 mg by mouth 4 (  four) times daily as needed for muscle spasms.      . carvedilol (COREG) 6.25 MG tablet Take 6.25 mg by mouth 2 (two) times daily with a meal.      . colchicine 0.6 MG tablet Take 0.6 mg by mouth 2 (two) times daily. gout      . cyclobenzaprine (FLEXERIL) 10 MG tablet Take 10 mg by mouth 3 (three) times daily as needed for muscle spasms.      Marland Kitchen escitalopram (LEXAPRO) 10 MG tablet Take 10 mg by mouth daily.      Marland Kitchen ezetimibe (ZETIA) 10 MG tablet Take 10 mg by mouth daily.      . febuxostat (ULORIC) 40 MG tablet Take 40 mg by mouth daily.      . fenofibrate (TRICOR) 145 MG tablet Take 145 mg by mouth daily.      . furosemide (LASIX) 80 MG tablet Take 1 tablet (80 mg total) by mouth 2 (two) times daily.      Marland Kitchen glimepiride (AMARYL) 2 MG tablet Take 2 mg by mouth daily before breakfast.      . insulin glargine (LANTUS) 100  UNIT/ML injection Inject 100 Units into the skin every morning. As directed      . potassium chloride SA (K-DUR,KLOR-CON) 20 MEQ tablet On the days you take metolazone take an EXTRA 40 meq; otherwise regular dose will be 20 meq twice daily  60 tablet  11  . terbinafine (LAMISIL) 1 % cream Apply 1 application topically 2 (two) times daily as needed (outbreak).        No current facility-administered medications for this visit.    Allergies:   Review of patient's allergies indicates no known allergies.   Social History:  The patient  reports that he has never smoked. He does not have any smokeless tobacco history on file. He reports that he drinks alcohol.   Family History:  The patient's family history includes Heart attack in his father.   ROS:  Please see the history of present illness.     All other systems reviewed and negative.   PHYSICAL EXAM: VS:  BP 130/76  Pulse 83  Ht 5\' 11"  (1.803 m)  Wt 375 lb (170.099 kg)  BMI 52.33 kg/m2 Well nourished, well developed, in no acute distress HEENT: normal Neck:  No JVDat 90 Cardiac:  normal S1, S2; RRR; 1/6 murmur @ RUSB Lungs:  clear to auscultation bilaterally, no wheezing, rhonchi or rales Abd: distended, non-tender Ext: trace bilateral LE edema Skin: warm and dry Neuro:  CNs 2-12 intact, no focal abnormalities noted  EKG:   NSR, HR 83, rightward axis, low-voltage, incomplete RBBB, no change from prior tracing    ASSESSMENT AND PLAN:  1. Chronic Diastolic CHF:  Volume stable. Continue current dose of diuretics. Recent creatinine improved back to baseline. 2. Cardiomyopathy:  EF has recovered. He would benefit from remaining on ACEI in light of prior cardiomyopathy as well as diabetes.  Restart Lisinopril at 10 mg QD.  Check BMET in 5 days.   3. Acute Kidney Injury on CKD:  Creatinine back to baseline. Followup basic metabolic panel in one week as noted above. 4. Aortic Stenosis s/p AVR:  Stable by last echo. Continue SBE  prophylaxis.   5. Hypertension:  Controlled.   6. Hyperlipidemia:  Continue statin. 7. Obesity: Patient is interested in pursuing a weight loss program managed at [facility]. 8. Disposition:  Follow up with me in one month.  Signed, [name], PA-C  01/20/2014 11:32 AM

## 2014-01-20 NOTE — Patient Instructions (Addendum)
RESUME LISINOPRIL 10 MG DAILY (TAKE 1/2 TABLET OF THE 20 MG )  YOU HAVE BEEN GIVEN AN RX TO GIVE TO THE HOME HEALTH NURSE TO GET LAB WORK IN 5 DAYS (BMET) WITH THE RESULTS TO BE FAXED TO [place], [facility] [phone]  Your physician recommends that you schedule a follow-up appointment ON 02/22/14 @ 11:10 WITH [place], PAC

## 2014-01-21 ENCOUNTER — Telehealth: Payer: Self-pay | Admitting: Cardiovascular Disease

## 2014-01-21 NOTE — Telephone Encounter (Signed)
New message    Can labs be drawn on Tuesday due to patient schedule.

## 2014-01-21 NOTE — Telephone Encounter (Signed)
Antonio [name] from [facility] called to make sure if it was okay to get the Blood work on 6 days instead of 5 days as scheduled per Md. Pt was seen in the office yesterday and  restarted on lisinopril 10 mg . Antonio is aware that is fine to draw the blood in 6 days instead of 5 after re-starting Lisinopril 10 mg.

## 2014-01-26 ENCOUNTER — Encounter: Payer: Self-pay | Admitting: Cardiovascular Disease

## 2014-02-22 ENCOUNTER — Ambulatory Visit: Payer: BC Managed Care – PPO | Admitting: Physician Assistant

## 2014-03-09 ENCOUNTER — Ambulatory Visit: Payer: BC Managed Care – PPO | Admitting: Physician Assistant

## 2014-03-25 ENCOUNTER — Encounter: Payer: Self-pay | Admitting: Physician Assistant

## 2014-03-25 ENCOUNTER — Ambulatory Visit (INDEPENDENT_AMBULATORY_CARE_PROVIDER_SITE_OTHER): Payer: Medicare Other | Admitting: Physician Assistant

## 2014-03-25 VITALS — BP 147/73 | HR 88 | Ht 71.0 in | Wt 381.0 lb

## 2014-03-25 DIAGNOSIS — I429 Cardiomyopathy, unspecified: Secondary | ICD-10-CM

## 2014-03-25 DIAGNOSIS — I10 Essential (primary) hypertension: Secondary | ICD-10-CM

## 2014-03-25 DIAGNOSIS — I5033 Acute on chronic diastolic (congestive) heart failure: Secondary | ICD-10-CM

## 2014-03-25 DIAGNOSIS — I428 Other cardiomyopathies: Secondary | ICD-10-CM

## 2014-03-25 DIAGNOSIS — Z954 Presence of other heart-valve replacement: Secondary | ICD-10-CM

## 2014-03-25 DIAGNOSIS — I359 Nonrheumatic aortic valve disorder, unspecified: Secondary | ICD-10-CM

## 2014-03-25 DIAGNOSIS — N189 Chronic kidney disease, unspecified: Secondary | ICD-10-CM

## 2014-03-25 DIAGNOSIS — Z952 Presence of prosthetic heart valve: Secondary | ICD-10-CM

## 2014-03-25 DIAGNOSIS — R0602 Shortness of breath: Secondary | ICD-10-CM

## 2014-03-25 DIAGNOSIS — R609 Edema, unspecified: Secondary | ICD-10-CM

## 2014-03-25 LAB — BASIC METABOLIC PANEL
BUN: 20 mg/dL (ref 6–23)
CO2: 34 mEq/L — ABNORMAL HIGH (ref 19–32)
Calcium: 9.1 mg/dL (ref 8.4–10.5)
Chloride: 95 mEq/L — ABNORMAL LOW (ref 96–112)
Creatinine, Ser: 1.2 mg/dL (ref 0.4–1.5)
GFR: 65.52 mL/min (ref >60.00)
Glucose, Bld: 265 mg/dL — ABNORMAL HIGH (ref 70–99)
POTASSIUM: 3.2 meq/L — AB (ref 3.5–5.1)
Sodium: 139 mEq/L (ref 135–145)

## 2014-03-25 LAB — BRAIN NATRIURETIC PEPTIDE: Pro B Natriuretic peptide (BNP): 42 pg/mL (ref 0.0–100.0)

## 2014-03-25 NOTE — Patient Instructions (Signed)
INCREASE LASIX TO 160 MG TWICE DAILY FOR 1 WEEK; AFTER THE 1 WEEK GO BACK TO LASIX 80 MG TWICE DAILY; IF YOU SEE THAT AFTER 3 DAYS YOUR LEG SWELLING HAS GONE DONE SIGNIFICANTLY THEN CALL [phone] SCOTT WEAVER, PAC   LAB WORK TODAY; BMET, BNP  REPEAT BMET IN 1 WEEK  PLEASE FOLLOW UP WITH SCOTT WEAVER, PAC IN 1 WEEK SAME DAY DR. Clifton James IS IN THE OFFICE

## 2014-03-25 NOTE — Progress Notes (Signed)
Cardiology Office Note   Date:  03/25/2014   ID:  Antonio Gosnell., DOB 1951-02-02, MRN [id number]  PCP:  [name], MD  Cardiologist:  Dr. Verne Carrow     History of Present Illness: Antonio Tafel. is a 63 y.o. male with a hx of severe AS s/p bioprosthetic AVR in 10/2011, combined systolic and diastolic CHF, NICM, DM, HTN, HL, OSA.  He did well after AVR but required several more weeks in the hospital for diuresis for volume excess.  He had post op AFib requiring Amiodarone.  EF improved after his AVR.   I saw him 12/29/13 with volume overload. He was up 40+ pounds. We adjusted his diuretics as an outpatient with plans for close follow up.  However, he was admitted 3/7-3/10 with low-back pain radiating to his chest. He was noted to be hypotensive and had evidence of acute kidney injury.  Medications were adjusted. His renal function improved.   He returns for follow up. He is volume overloaded again. He denies dietary indiscretion with salt or fluid. He does not weigh himself. His LE edema has slowly worsened over the last several weeks. He notes increased dyspnea. He is NYHA class III. He notes orthopnea. He denies chest pain or syncope. He increased his Lasix to 160 mg in the morning and 80 mg in the evening several days ago. He has noted some improvement in his LE edema.  Studies:  - Echo (05/2011):  EF 45-50%; severe AS (mean 49 mmHg).  - Echo (09/2012):  Mod LVH, EF 55-65%, no RWMA, Gr 1 DD, AVR ok, mild LAE.  - Echo (01/04/14):  Normal LVF, mild LVH, mod to severe LAE, poor acoustic windows; no turbulence through LVOT  - Lexiscan Myoview (10/2010):  IL scar with mild peri-infarct ischemia, EF 34%.  - LHC (10/2010):  No CAD, EF 35-40%; AV peak to peak gradient of 31 mmHg.  - Carotid US (10/2011): No ICA stenosis.  - Holter (01/2012):  NSR; no AFib     Recent Labs: 01/02/2014: Pro B Natriuretic peptide (BNP) 73.6; TSH 3.305  01/04/2014: Hemoglobin 12.0*  01/05/2014:  Creatinine 1.62*; Potassium 4.1 01/19/14 (from PCP):  K 3.8, creatinine 1.3  Wt Readings from Last 3 Encounters:  03/25/14 381 lb (172.82 kg)  01/20/14 375 lb (170.099 kg)  01/05/14 371 lb 4.1 oz (168.4 kg)     Past Medical History  Diagnosis Date  . Hepatitis   . NICM (nonischemic cardiomyopathy)     echo 12/11: EF 45-50%, grade 1 diast dysfxn, moderately severe AS (AVA 0.9, mean 31 mmHg), mild MR;    cath 1/12:  normal cors, AVA 1.0, peak to peak gradiet for AV 31 mmHg, mod-severe AS, EF 35-40%  . 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 Hx MRSA infection     thigh abcess 11/06  . Anxiety   . Aortic stenosis     AVR 11/29/11  . HTN (hypertension)   . DM2 (diabetes mellitus, type 2)   . HLD (hyperlipidemia)   . Gout   . Chronic diastolic heart failure     Echo 09/2012: mod LVH, EF 55-65%, Gr 1 DD, AVR ok, mild LAE.    Current Outpatient Prescriptions  Medication Sig Dispense Refill  . aspirin 325 MG tablet Take 325 mg by mouth daily.      Marland Kitchen atorvastatin (LIPITOR) 40 MG tablet Take 40 mg by mouth daily.      Marland Kitchen  carisoprodol (SOMA) 350 MG tablet Take 350 mg by mouth 4 (four) times daily as needed for muscle spasms.      . carvedilol (COREG) 6.25 MG tablet Take 6.25 mg by mouth 2 (two) times daily with a meal.      . colchicine 0.6 MG tablet Take 0.6 mg by mouth 2 (two) times daily. gout      . cyclobenzaprine (FLEXERIL) 10 MG tablet Take 10 mg by mouth 3 (three) times daily as needed for muscle spasms.      Marland Kitchen. escitalopram (LEXAPRO) 10 MG tablet Take 10 mg by mouth daily.      Marland Kitchen. ezetimibe (ZETIA) 10 MG tablet Take 10 mg by mouth daily.      . febuxostat (ULORIC) 40 MG tablet Take 40 mg by mouth daily.      . fenofibrate (TRICOR) 145 MG tablet Take 145 mg by mouth daily.      . furosemide (LASIX) 80 MG tablet Take 1 tablet (80 mg total) by mouth 2 (two) times daily.      Marland Kitchen. glimepiride (AMARYL) 2 MG tablet Take 2 mg by mouth daily before breakfast.      . insulin glargine  (LANTUS) 100 UNIT/ML injection Inject 100 Units into the skin every morning. As directed      . lisinopril (PRINIVIL,ZESTRIL) 20 MG tablet Take 0.5 tablets (10 mg total) by mouth daily.      . potassium chloride SA (K-DUR,KLOR-CON) 20 MEQ tablet On the days you take metolazone take an EXTRA 40 meq; otherwise regular dose will be 20 meq twice daily  60 tablet  11  . terbinafine (LAMISIL) 1 % cream Apply 1 application topically 2 (two) times daily as needed (outbreak).        No current facility-administered medications for this visit.    Allergies:   Review of patient's allergies indicates no known allergies.   Social History:  The patient  reports that he has never smoked. He does not have any smokeless tobacco history on file. He reports that he drinks alcohol.   Family History:  The patient's family history includes Heart attack in his father.   ROS:  Please see the history of present illness.     All other systems reviewed and negative.   PHYSICAL EXAM: VS:  BP 147/73  Pulse 88  Ht 5\' 11"  (1.803 m)  Wt 381 lb (172.82 kg)  BMI 53.16 kg/m2 Well nourished, well developed, in no acute distress HEENT: normal Neck:  I cannot assess JVDat 90 Cardiac:  normal S1, S2; RRR; 1/6 murmur @ RUSB Lungs:  clear to auscultation bilaterally, no wheezing, rhonchi or rales Abd: soft, non-tender Ext: 1-2+ bilateral LE edema Skin: warm and dry Neuro:  CNs 2-12 intact, no focal abnormalities noted  EKG:   NSR, HR 88, rightward axis, low-voltage, incomplete RBBB, no change from prior tracing    ASSESSMENT AND PLAN:  1. Acute on Chronic Diastolic CHF:  He is volume overloaded again. I reviewed with him again the importance of limiting his salt and fluid intake. We also discussed the importance of daily weights. He is trying to get a scale through [facility].  I will increase his Lasix to 160 mg twice daily for one week. Check a basic metabolic panel and BNP today. Repeat basic metabolic panel  in one week. He will be brought back in close followup. 2. Cardiomyopathy:  EF recovered. Continue ACEI, beta blocker.   3. CKD:  Check BMET today  and repeat in 1 week. 4. Aortic Stenosis s/p AVR:  Stable by last echo. Continue SBE prophylaxis.   5. Hypertension:  BP above target.  Adjust diuresis as noted.   6. Hyperlipidemia:  Continue statin. 7. Disposition:  Follow up with me in 2 weeks.  Signed, Brynda Rim, MHS 03/25/2014 12:28 PM    [facility] [address] [place], [place], [place]  [address] Phone: [phone]; Fax: [phone]

## 2014-03-26 ENCOUNTER — Telehealth: Payer: Self-pay | Admitting: *Deleted

## 2014-03-26 NOTE — Telephone Encounter (Signed)
pt notified about lab results. Pt advised to take 40 meq bid x 1 day then start 20 meq bid. pt has lab and f/u on 6/5.

## 2014-03-26 NOTE — Telephone Encounter (Signed)
lmptcb for lab results and med changes 

## 2014-04-02 ENCOUNTER — Encounter: Payer: Self-pay | Admitting: Physician Assistant

## 2014-04-02 ENCOUNTER — Ambulatory Visit (INDEPENDENT_AMBULATORY_CARE_PROVIDER_SITE_OTHER): Payer: Medicare Other | Admitting: Physician Assistant

## 2014-04-02 ENCOUNTER — Other Ambulatory Visit: Payer: Medicare Other

## 2014-04-02 VITALS — BP 108/64 | HR 92 | Ht 71.0 in | Wt 368.0 lb

## 2014-04-02 DIAGNOSIS — I359 Nonrheumatic aortic valve disorder, unspecified: Secondary | ICD-10-CM

## 2014-04-02 DIAGNOSIS — Z952 Presence of prosthetic heart valve: Secondary | ICD-10-CM

## 2014-04-02 DIAGNOSIS — N189 Chronic kidney disease, unspecified: Secondary | ICD-10-CM

## 2014-04-02 DIAGNOSIS — I428 Other cardiomyopathies: Secondary | ICD-10-CM

## 2014-04-02 DIAGNOSIS — I5032 Chronic diastolic (congestive) heart failure: Secondary | ICD-10-CM

## 2014-04-02 DIAGNOSIS — Z954 Presence of other heart-valve replacement: Secondary | ICD-10-CM

## 2014-04-02 DIAGNOSIS — E785 Hyperlipidemia, unspecified: Secondary | ICD-10-CM

## 2014-04-02 DIAGNOSIS — I429 Cardiomyopathy, unspecified: Secondary | ICD-10-CM

## 2014-04-02 DIAGNOSIS — I10 Essential (primary) hypertension: Secondary | ICD-10-CM

## 2014-04-02 LAB — BASIC METABOLIC PANEL
BUN: 33 mg/dL — ABNORMAL HIGH (ref 6–23)
CO2: 33 mEq/L — ABNORMAL HIGH (ref 19–32)
CREATININE: 1.5 mg/dL (ref 0.4–1.5)
Calcium: 9.4 mg/dL (ref 8.4–10.5)
Chloride: 98 mEq/L (ref 96–112)
GFR: 51.34 mL/min — ABNORMAL LOW (ref >60.00)
Glucose, Bld: 155 mg/dL — ABNORMAL HIGH (ref 70–99)
Potassium: 4.5 mEq/L (ref 3.5–5.1)
Sodium: 140 mEq/L (ref 135–145)

## 2014-04-02 MED ORDER — POTASSIUM CHLORIDE CRYS ER 20 MEQ PO TBCR: 20.0000 meq | EXTENDED_RELEASE_TABLET | Freq: Two times a day (BID) | ORAL | Status: DC

## 2014-04-02 NOTE — Progress Notes (Signed)
Cardiology Office Note   Date:  04/02/2014   ID:  Antonio [name]., DOB [birth year], MRN [id number]  PCP:  [name], MD  Cardiologist:  Dr. Verne Carrow     History of Present Illness: Antonio [name]. is a 63 y.o. male with a hx of severe AS s/p bioprosthetic AVR in 10/2011, combined systolic and diastolic CHF, NICM, DM, HTN, HL, OSA.  He did well after AVR but required several more weeks in the hospital for diuresis for volume excess.  He had post op AFib requiring Amiodarone.  EF improved after his AVR.   I saw him 12/29/13 with volume overload. He was up 40+ pounds. We adjusted his diuretics as an outpatient with plans for close follow up.  However, he was admitted 3/7-3/10 with low-back pain radiating to his chest. He was noted to be hypotensive and had evidence of acute kidney injury.  Medications were adjusted. His renal function improved.   I saw him last week. He was volume overloaded again. I adjusted his diuretics and brought back today for close follow up.  He is down 13 lbs.  He did have a bout of viral gastroenteritis last week.  This is resolved now.  His breathing is better.  He denies orthopnea, PND.  LE edema is improved.  He denies syncope or chest pain.    Studies:  - Echo (05/2011):  EF 45-50%; severe AS (mean 49 mmHg).  - Echo (09/2012):  Mod LVH, EF 55-65%, no RWMA, Gr 1 DD, AVR ok, mild LAE.  - Echo (01/04/14):  Normal LVF, mild LVH, mod to severe LAE, poor acoustic windows; no turbulence through LVOT  - Lexiscan Myoview (10/2010):  IL scar with mild peri-infarct ischemia, EF 34%.  - LHC (10/2010):  No CAD, EF 35-40%; AV peak to peak gradient of 31 mmHg.  - Carotid US (10/2011): No ICA stenosis.  - Holter (01/2012):  NSR; no AFib     Recent Labs: 01/02/2014: TSH 3.305  01/04/2014: Hemoglobin 12.0*  03/25/2014: Creatinine 1.2; Potassium 3.2*; Pro B Natriuretic peptide (BNP) 42.0 01/19/14 (from PCP):  K 3.8, creatinine 1.3   Wt Readings from Last 3  Encounters:  04/02/14 368 lb (166.924 kg)  03/25/14 381 lb (172.82 kg)  01/20/14 375 lb (170.099 kg)     Past Medical History  Diagnosis Date  . Hepatitis   . NICM (nonischemic cardiomyopathy)     echo 12/11: EF 45-50%, grade 1 diast dysfxn, moderately severe AS (AVA 0.9, mean 31 mmHg), mild MR;    cath 1/12:  normal cors, AVA 1.0, peak to peak gradiet for AV 31 mmHg, mod-severe AS, EF 35-40%  . 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 Hx MRSA infection     thigh abcess 11/06  . Anxiety   . Aortic stenosis     AVR 11/29/11  . HTN (hypertension)   . DM2 (diabetes mellitus, type 2)   . HLD (hyperlipidemia)   . Gout   . Chronic diastolic heart failure     Echo 09/2012: mod LVH, EF 55-65%, Gr 1 DD, AVR ok, mild LAE.    Current Outpatient Prescriptions  Medication Sig Dispense Refill  . aspirin 325 MG tablet Take 325 mg by mouth daily.      Marland Kitchen atorvastatin (LIPITOR) 40 MG tablet Take 40 mg by mouth daily.      . carisoprodol (SOMA) 350 MG tablet Take 350 mg by mouth 4 (four)  times daily as needed for muscle spasms.      . carvedilol (COREG) 6.25 MG tablet Take 6.25 mg by mouth 2 (two) times daily with a meal.      . colchicine 0.6 MG tablet Take 0.6 mg by mouth 2 (two) times daily. gout      . cyclobenzaprine (FLEXERIL) 10 MG tablet Take 10 mg by mouth 3 (three) times daily as needed for muscle spasms.      Marland Kitchen. escitalopram (LEXAPRO) 10 MG tablet Take 10 mg by mouth daily.      Marland Kitchen. ezetimibe (ZETIA) 10 MG tablet Take 10 mg by mouth daily.      . febuxostat (ULORIC) 40 MG tablet Take 40 mg by mouth daily.      . fenofibrate (TRICOR) 145 MG tablet Take 145 mg by mouth daily.      Marland Kitchen. glimepiride (AMARYL) 2 MG tablet Take 2 mg by mouth daily before breakfast.      . insulin glargine (LANTUS) 100 UNIT/ML injection Inject 100 Units into the skin every morning. As directed      . lisinopril (PRINIVIL,ZESTRIL) 20 MG tablet Take 0.5 tablets (10 mg total) by mouth daily.      . potassium  chloride SA (K-DUR,KLOR-CON) 20 MEQ tablet On the days you take metolazone take an EXTRA 40 meq; otherwise regular dose will be 20 meq twice daily  60 tablet  11  . terbinafine (LAMISIL) 1 % cream Apply 1 application topically 2 (two) times daily as needed (outbreak).       . furosemide (LASIX) 80 MG tablet Take 1 tablet (80 mg total) by mouth 2 (two) times daily.       No current facility-administered medications for this visit.    Allergies:   Review of patient's allergies indicates no known allergies.   Social History:  The patient  reports that he has never smoked. He does not have any smokeless tobacco history on file. He reports that he drinks alcohol.   Family History:  The patient's family history includes Heart attack in his father.   ROS:  Please see the history of present illness.     All other systems reviewed and negative.   PHYSICAL EXAM: VS:  BP 108/64  Pulse 92  Ht 5\' 11"  (1.803 m)  Wt 368 lb (166.924 kg)  BMI 51.35 kg/m2 Well nourished, well developed, in no acute distress HEENT: normal Neck:  I cannot assess JVDat 90 Cardiac:  normal S1, S2; RRR; 1/6 murmur @ RUSB Lungs:  clear to auscultation bilaterally, no wheezing, rhonchi or rales Abd: soft, non-tender Ext: trace to 1+ bilateral LE edema Skin: warm and dry Neuro:  CNs 2-12 intact, no focal abnormalities noted  EKG:    NSR HR 92, low voltage, rightward axis, no change from prior tracing    ASSESSMENT AND PLAN:  1. Chronic Diastolic CHF:   Volume improved.  Resume Lasix 80 bid and K+ 20 bid.  We discussed weighing daily and when to take extra Lasix.  Check BMET today.  2. Cardiomyopathy:  EF has recovered. Continue ACEI, beta blocker.   3. CKD:  Check BMET today. 4. Aortic Stenosis s/p AVR:  Stable by last echo. Continue SBE prophylaxis. 5. Hypertension:  Controlled.  6. Hyperlipidemia:  Continue statin. 7. Disposition:  Follow up with Dr. Verne Carrowhristopher McAlhany in 3 mos.  Signed, Brynda RimScott Reita Shindler, PA-C,  MHS 04/02/2014 12:09 PM    [facility] [address], [place], [place]  [address] Phone: [phone]; Fax: [phone]

## 2014-04-02 NOTE — Patient Instructions (Addendum)
Your physician recommends that you return for lab work in:  Today - BMET (Dx [phone])  Your physician recommends that you schedule a follow-up appointment in:  3 mos with Dr. Verne Carrow.    Continue taking Lasix 80 mg twice daily. Continue taking Potassium 20 mEq twice daily.  Weigh daily.  If your weight increases 3 lbs in one day, take an extra Lasix 80 mg.  Call if your weight does not go down to baseline.

## 2014-04-05 ENCOUNTER — Telehealth: Payer: Self-pay | Admitting: *Deleted

## 2014-04-05 NOTE — Telephone Encounter (Signed)
pt notified about lab results verbalized understanding. Pt states he may be going to [facility] for a weight loss program at the [employer]. Pt states he did this years ago and lost 100 lb. State he wants to live to see his grandchildren grow up.

## 2014-05-04 ENCOUNTER — Telehealth: Payer: Self-pay | Admitting: Cardiovascular Disease

## 2014-05-04 NOTE — Telephone Encounter (Signed)
I have not seen this form Pat. Is it in the office? chris

## 2014-05-04 NOTE — Telephone Encounter (Signed)
ok 

## 2014-05-04 NOTE — Telephone Encounter (Signed)
New message     Did you get a fax from [facility] in [place]?  He needs a referral from Dr Clifton James to get into the weight loss program.  They were going to fax something to [place].

## 2014-05-04 NOTE — Telephone Encounter (Signed)
We have received information about diet and program but have not received request for clearance for pt to begin program.

## 2014-05-05 NOTE — Telephone Encounter (Signed)
OK to start diet. Earney Hamburg

## 2014-05-05 NOTE — Telephone Encounter (Signed)
Left message to call back  

## 2014-05-10 NOTE — Telephone Encounter (Signed)
Pt is requesting a written prescription from Dr Clifton James for [facility].   The prescription needs to include:  [facility]  is a medically supervised, residential program for weight loss at [facility].  Pt must eat all his meals on site at [facility] while he is in the program.  Obesity may be contributing to pt's current medical problems.   Pt aware Dr Clifton James out of the office this week.  I will forward to Dr Clifton James for review.

## 2014-05-10 NOTE — Telephone Encounter (Signed)
Follow up ° ° ° ° ° °Returning Pat's call °

## 2014-05-17 NOTE — Telephone Encounter (Signed)
Dennie Bible, Can we take care of this for Antonio [name]? Thanks, chris

## 2014-05-20 ENCOUNTER — Encounter: Payer: Self-pay | Admitting: *Deleted

## 2014-05-20 NOTE — Telephone Encounter (Signed)
Letter written . I have placed call to pt to find where to send. Left message for him to call back.

## 2014-05-27 ENCOUNTER — Encounter: Payer: Self-pay | Admitting: *Deleted

## 2014-05-27 ENCOUNTER — Telehealth: Payer: Self-pay | Admitting: Cardiovascular Disease

## 2014-05-27 NOTE — Telephone Encounter (Signed)
Spoke with pt who requests I fax letter to [employer]. Will fax today.

## 2014-05-27 NOTE — Telephone Encounter (Signed)
Follow up      Returning Pat's call from a couple of days ago

## 2014-05-27 NOTE — Telephone Encounter (Signed)
Spoke with Cunningham who received fax from our office. She is calling to let us know program is now called [facility]. It is not a residential program. No longer affiliated with [facility]. They do not need a referral for pt to begin program.  I spoke with pt to make sure he was aware of this information. He is requesting I write a new letter and fax to the [facility] addressing this. States he wants this letter for his records. He does not want a copy sent to him but requests I fax to the [facility] and he will get copy when at program. Will fax new letter today.

## 2014-05-27 NOTE — Telephone Encounter (Signed)
New message          Tamela Oddi calling about a fax that was just sent for this pt

## 2014-07-08 ENCOUNTER — Ambulatory Visit: Payer: BC Managed Care – PPO | Admitting: Cardiovascular Disease

## 2014-07-26 ENCOUNTER — Encounter: Payer: Self-pay | Admitting: Cardiovascular Disease

## 2014-10-07 ENCOUNTER — Encounter (HOSPITAL_COMMUNITY): Payer: Self-pay | Admitting: Cardiovascular Disease

## 2015-02-17 ENCOUNTER — Telehealth: Payer: Self-pay | Admitting: Cardiovascular Disease

## 2015-02-17 NOTE — Telephone Encounter (Signed)
Pt c/o swelling: STAT is pt has developed SOB within 24 hours  1. How long have you been experiencing swelling? April 1st  2. Where is the swelling located? Ankles, legs and feet   3.  Are you currently taking a "fluid pill"? Yes started taking thefurosemide (LASIX) 80 MG tablet because it was unusual to him to start retaining fluid   4.  Are you currently SOB? He doesn't feel as if he is..but at night he is not sure   5.  Have you traveled recently? No    New message  Pt called states that waiting until may or June for an OV to see Dr. Clifton James is unacceptable. Pt called states that he has gained 25-30 lbs since the 1st of April legs ankle and feet are swollen. PCP states that they may hear an arrythmia in his heart beat.

## 2015-02-17 NOTE — Telephone Encounter (Signed)
Left message on voice mail that Dr.McAlhany is not in the office tomorrow but we can work him in with a PA on 4/22. Will call back tomorrow with an appointment.

## 2015-02-18 ENCOUNTER — Encounter: Payer: Self-pay | Admitting: Physician Assistant

## 2015-02-18 ENCOUNTER — Inpatient Hospital Stay (HOSPITAL_COMMUNITY)
Admission: AD | Admit: 2015-02-18 | Discharge: 2015-02-23 | DRG: 292 | Disposition: A | Discharge status: Home / Self Care | Payer: Medicare Other | Source: Ambulatory Visit | Attending: Internal Medicine | Admitting: Internal Medicine

## 2015-02-18 ENCOUNTER — Encounter (HOSPITAL_COMMUNITY): Payer: Self-pay | Admitting: General Practice

## 2015-02-18 ENCOUNTER — Ambulatory Visit (INDEPENDENT_AMBULATORY_CARE_PROVIDER_SITE_OTHER): Payer: Medicare Other | Admitting: Internal Medicine

## 2015-02-18 VITALS — BP 120/70 | HR 86 | Ht 71.0 in | Wt 374.4 lb

## 2015-02-18 DIAGNOSIS — I10 Essential (primary) hypertension: Secondary | ICD-10-CM | POA: Diagnosis present

## 2015-02-18 DIAGNOSIS — R609 Edema, unspecified: Secondary | ICD-10-CM | POA: Diagnosis present

## 2015-02-18 DIAGNOSIS — I359 Nonrheumatic aortic valve disorder, unspecified: Secondary | ICD-10-CM

## 2015-02-18 DIAGNOSIS — H5441 Blindness, right eye, normal vision left eye: Secondary | ICD-10-CM | POA: Diagnosis present

## 2015-02-18 DIAGNOSIS — I5033 Acute on chronic diastolic (congestive) heart failure: Secondary | ICD-10-CM | POA: Diagnosis present

## 2015-02-18 DIAGNOSIS — R011 Cardiac murmur, unspecified: Secondary | ICD-10-CM | POA: Diagnosis present

## 2015-02-18 DIAGNOSIS — E785 Hyperlipidemia, unspecified: Secondary | ICD-10-CM | POA: Diagnosis present

## 2015-02-18 DIAGNOSIS — M109 Gout, unspecified: Secondary | ICD-10-CM | POA: Diagnosis present

## 2015-02-18 DIAGNOSIS — I4891 Unspecified atrial fibrillation: Secondary | ICD-10-CM | POA: Diagnosis present

## 2015-02-18 DIAGNOSIS — I5032 Chronic diastolic (congestive) heart failure: Secondary | ICD-10-CM

## 2015-02-18 DIAGNOSIS — Z794 Long term (current) use of insulin: Secondary | ICD-10-CM | POA: Diagnosis not present

## 2015-02-18 DIAGNOSIS — E876 Hypokalemia: Secondary | ICD-10-CM | POA: Diagnosis present

## 2015-02-18 DIAGNOSIS — Z6841 Body Mass Index (BMI) 40.0 and over, adult: Secondary | ICD-10-CM | POA: Diagnosis not present

## 2015-02-18 DIAGNOSIS — K759 Inflammatory liver disease, unspecified: Secondary | ICD-10-CM | POA: Diagnosis present

## 2015-02-18 DIAGNOSIS — Z953 Presence of xenogenic heart valve: Secondary | ICD-10-CM

## 2015-02-18 DIAGNOSIS — G4733 Obstructive sleep apnea (adult) (pediatric): Secondary | ICD-10-CM | POA: Diagnosis present

## 2015-02-18 DIAGNOSIS — E119 Type 2 diabetes mellitus without complications: Secondary | ICD-10-CM

## 2015-02-18 DIAGNOSIS — I429 Cardiomyopathy, unspecified: Secondary | ICD-10-CM | POA: Diagnosis present

## 2015-02-18 DIAGNOSIS — Z952 Presence of prosthetic heart valve: Secondary | ICD-10-CM | POA: Diagnosis not present

## 2015-02-18 DIAGNOSIS — F419 Anxiety disorder, unspecified: Secondary | ICD-10-CM | POA: Diagnosis present

## 2015-02-18 DIAGNOSIS — I5023 Acute on chronic systolic (congestive) heart failure: Secondary | ICD-10-CM

## 2015-02-18 DIAGNOSIS — I501 Left ventricular failure: Secondary | ICD-10-CM | POA: Diagnosis not present

## 2015-02-18 DIAGNOSIS — I5043 Acute on chronic combined systolic (congestive) and diastolic (congestive) heart failure: Principal | ICD-10-CM

## 2015-02-18 HISTORY — DX: Dependence on other enabling machines and devices: Z99.89

## 2015-02-18 HISTORY — DX: Personal history of other diseases of urinary system: Z87.448

## 2015-02-18 HISTORY — DX: Obstructive sleep apnea (adult) (pediatric): G47.33

## 2015-02-18 HISTORY — DX: Unspecified osteoarthritis, unspecified site: M19.90

## 2015-02-18 LAB — CBC WITH DIFFERENTIAL/PLATELET
BASOS PCT: 0 % (ref 0–1)
Basophils Absolute: 0 10*3/uL (ref 0.0–0.1)
EOS ABS: 0.3 10*3/uL (ref 0.0–0.7)
Eosinophils Relative: 3 % (ref 0–5)
HCT: 36.6 % — ABNORMAL LOW (ref 39.0–52.0)
HEMOGLOBIN: 11.6 g/dL — AB (ref 13.0–17.0)
LYMPHS ABS: 3.2 10*3/uL (ref 0.7–4.0)
Lymphocytes Relative: 34 % (ref 12–46)
MCH: 25.8 pg — AB (ref 26.0–34.0)
MCHC: 31.7 g/dL (ref 30.0–36.0)
MCV: 81.3 fL (ref 78.0–100.0)
MONO ABS: 0.8 10*3/uL (ref 0.1–1.0)
Monocytes Relative: 8 % (ref 3–12)
NEUTROS PCT: 55 % (ref 43–77)
Neutro Abs: 5.2 10*3/uL (ref 1.7–7.7)
PLATELETS: UNDETERMINED 10*3/uL (ref 150–400)
RBC: 4.5 MIL/uL (ref 4.22–5.81)
RDW: 15.5 % (ref 11.5–15.5)
WBC: 9.5 10*3/uL (ref 4.0–10.5)

## 2015-02-18 LAB — COMPREHENSIVE METABOLIC PANEL
ALT: 21 U/L (ref 0–53)
AST: 24 U/L (ref 0–37)
Albumin: 3.3 g/dL — ABNORMAL LOW (ref 3.5–5.2)
Alkaline Phosphatase: 134 U/L — ABNORMAL HIGH (ref 39–117)
Anion gap: 8 (ref 5–15)
BUN: 29 mg/dL — AB (ref 6–23)
CALCIUM: 9.1 mg/dL (ref 8.4–10.5)
CO2: 30 mmol/L (ref 19–32)
Chloride: 101 mmol/L (ref 96–112)
Creatinine, Ser: 1.26 mg/dL (ref 0.50–1.35)
GFR calc non Af Amer: 59 mL/min — ABNORMAL LOW (ref >90)
GFR, EST AFRICAN AMERICAN: 68 mL/min — AB (ref >90)
Glucose, Bld: 305 mg/dL — ABNORMAL HIGH (ref 70–99)
Potassium: 4.4 mmol/L (ref 3.5–5.1)
Sodium: 139 mmol/L (ref 135–145)
Total Bilirubin: 0.3 mg/dL (ref 0.3–1.2)
Total Protein: 6.9 g/dL (ref 6.0–8.3)

## 2015-02-18 LAB — GLUCOSE, CAPILLARY: Glucose-Capillary: 307 mg/dL — ABNORMAL HIGH (ref 70–99)

## 2015-02-18 LAB — MAGNESIUM: Magnesium: 2 mg/dL (ref 1.5–2.5)

## 2015-02-18 LAB — PROTIME-INR
INR: 1.24 (ref 0.00–1.49)
PROTHROMBIN TIME: 15.7 s — AB (ref 11.6–15.2)

## 2015-02-18 LAB — TSH: TSH: 4.928 u[IU]/mL — AB (ref 0.350–4.500)

## 2015-02-18 LAB — BRAIN NATRIURETIC PEPTIDE: B Natriuretic Peptide: 67.3 pg/mL (ref 0.0–100.0)

## 2015-02-18 MED ORDER — METOLAZONE 2.5 MG PO TABS
2.5000 mg | ORAL_TABLET | Freq: Every day | ORAL | Status: AC
  Administered 2015-02-19: 2.5 mg via ORAL
  Filled 2015-02-18 (×2): qty 1

## 2015-02-18 MED ORDER — FUROSEMIDE 10 MG/ML IJ SOLN: 80.0000 mg | Freq: Two times a day (BID) | INTRAMUSCULAR | Status: DC

## 2015-02-18 MED ORDER — FUROSEMIDE 10 MG/ML IJ SOLN
80.0000 mg | Freq: Two times a day (BID) | INTRAMUSCULAR | Status: DC
  Administered 2015-02-18 – 2015-02-22 (×8): 80 mg via INTRAVENOUS
  Filled 2015-02-18 (×11): qty 8

## 2015-02-18 MED ORDER — SODIUM CHLORIDE 0.9 % IJ SOLN
3.0000 mL | INTRAMUSCULAR | Status: DC | PRN
Start: 2015-02-18 — End: 2015-02-23

## 2015-02-18 MED ORDER — INSULIN ASPART 100 UNIT/ML ~~LOC~~ SOLN
0.0000 [IU] | Freq: Three times a day (TID) | SUBCUTANEOUS | Status: DC
  Administered 2015-02-19: 3 [IU] via SUBCUTANEOUS
  Administered 2015-02-19: 2 [IU] via SUBCUTANEOUS
  Administered 2015-02-19: 5 [IU] via SUBCUTANEOUS
  Administered 2015-02-20: 3 [IU] via SUBCUTANEOUS
  Administered 2015-02-20: 2 [IU] via SUBCUTANEOUS
  Administered 2015-02-20: 3 [IU] via SUBCUTANEOUS
  Administered 2015-02-21: 5 [IU] via SUBCUTANEOUS
  Administered 2015-02-21: 3 [IU] via SUBCUTANEOUS
  Administered 2015-02-22 – 2015-02-23 (×3): 5 [IU] via SUBCUTANEOUS

## 2015-02-18 MED ORDER — BUPROPION HCL ER (XL) 300 MG PO TB24
300.0000 mg | ORAL_TABLET | Freq: Every day | ORAL | Status: DC
  Administered 2015-02-19 – 2015-02-23 (×5): 300 mg via ORAL
  Filled 2015-02-18 (×5): qty 1

## 2015-02-18 MED ORDER — COLCHICINE 0.6 MG PO TABS
0.6000 mg | ORAL_TABLET | Freq: Every day | ORAL | Status: DC
  Administered 2015-02-19 – 2015-02-23 (×5): 0.6 mg via ORAL
  Filled 2015-02-18 (×5): qty 1

## 2015-02-18 MED ORDER — ALLOPURINOL 300 MG PO TABS: 300.0000 mg | ORAL_TABLET | Freq: Every day | ORAL | Status: DC

## 2015-02-18 MED ORDER — ACETAMINOPHEN 325 MG PO TABS: 650.0000 mg | ORAL_TABLET | ORAL | Status: DC | PRN

## 2015-02-18 MED ORDER — INSULIN GLARGINE 100 UNIT/ML ~~LOC~~ SOLN
60.0000 [IU] | Freq: Two times a day (BID) | SUBCUTANEOUS | Status: DC
  Administered 2015-02-18 – 2015-02-23 (×10): 60 [IU] via SUBCUTANEOUS
  Filled 2015-02-18 (×12): qty 0.6

## 2015-02-18 MED ORDER — ENOXAPARIN SODIUM 40 MG/0.4ML ~~LOC~~ SOLN
40.0000 mg | SUBCUTANEOUS | Status: DC
  Administered 2015-02-18 – 2015-02-21 (×4): 40 mg via SUBCUTANEOUS
  Filled 2015-02-18 (×5): qty 0.4

## 2015-02-18 MED ORDER — ATORVASTATIN CALCIUM 40 MG PO TABS
40.0000 mg | ORAL_TABLET | Freq: Every day | ORAL | Status: DC
  Administered 2015-02-19 – 2015-02-22 (×4): 40 mg via ORAL
  Filled 2015-02-18 (×5): qty 1

## 2015-02-18 MED ORDER — NITROGLYCERIN 0.4 MG SL SUBL: 0.4000 mg | SUBLINGUAL_TABLET | SUBLINGUAL | Status: DC | PRN

## 2015-02-18 MED ORDER — SODIUM CHLORIDE 0.9 % IJ SOLN
3.0000 mL | Freq: Two times a day (BID) | INTRAMUSCULAR | Status: DC
  Administered 2015-02-18 – 2015-02-23 (×10): 3 mL via INTRAVENOUS

## 2015-02-18 MED ORDER — HYDROCODONE-ACETAMINOPHEN 7.5-325 MG PO TABS: 1.0000 | ORAL_TABLET | ORAL | Status: DC | PRN

## 2015-02-18 MED ORDER — FEBUXOSTAT 40 MG PO TABS
80.0000 mg | ORAL_TABLET | Freq: Every day | ORAL | Status: DC
  Administered 2015-02-18 – 2015-02-23 (×6): 80 mg via ORAL
  Filled 2015-02-18 (×6): qty 2

## 2015-02-18 MED ORDER — ONDANSETRON HCL 4 MG/2ML IJ SOLN: 4.0000 mg | Freq: Four times a day (QID) | INTRAMUSCULAR | Status: DC | PRN

## 2015-02-18 MED ORDER — SODIUM CHLORIDE 0.9 % IV SOLN: 250.0000 mL | INTRAVENOUS | Status: DC | PRN

## 2015-02-18 MED ORDER — ZOLPIDEM TARTRATE 5 MG PO TABS
5.0000 mg | ORAL_TABLET | Freq: Every evening | ORAL | Status: DC | PRN
  Administered 2015-02-20 – 2015-02-22 (×4): 5 mg via ORAL
  Filled 2015-02-18 (×4): qty 1

## 2015-02-18 MED ORDER — FUROSEMIDE 80 MG PO TABS
80.0000 mg | ORAL_TABLET | Freq: Two times a day (BID) | ORAL | Status: DC
  Filled 2015-02-18 (×2): qty 1

## 2015-02-18 MED ORDER — ALPRAZOLAM 0.25 MG PO TABS
0.2500 mg | ORAL_TABLET | Freq: Two times a day (BID) | ORAL | Status: DC | PRN
  Filled 2015-02-18: qty 1

## 2015-02-18 NOTE — Progress Notes (Signed)
Cardiology Office Note   Date:  02/18/2015   ID:  Antonio [name]., DOB [birth year], MRN [id number]  PCP:  [name], MD  Cardiologist:  Dr. Margart Sickles, PA-C   Chief Complaint  Patient presents with  . Edema    History of Present Illness: Antonio [name]. is a 64 y.o. male with a history of severe AS, s/p bioprosthetic AVR in 10/2011, combined systolic and diastolic CHF, NICM, DM, HTN, HL, OSA. He did well after AVR but required several more weeks in the hospital for diuresis for volume excess. He had post op AFib requiring Amiodarone.EF improved after his AVR.   He was in the [employer] program and left at 346 lbs about 3 weeks ago. The records may reflect 326 lbs but pt says he was very hungry and eating more protein than the program allowed, so he began fudging on his weights. When he left, he was not on furosemide.   Antonio [name]. presents for weight gain and increasing edema. His weight has gone up steadily since discharge from the facility, 374.4 lbs. He has been eating out a lot, but does not add salt. No ETOH, has been drinking 6-8 pints of water per day.   He denies SOB, sleeps on 3-4 pillows chronically due to back problems, no recent change. No DOE, PND, no true orthopnea.    Past Medical History  Diagnosis Date  . Hepatitis   . NICM (nonischemic cardiomyopathy)     echo 12/11: EF 45-50%, grade 1 diast dysfxn, moderately severe AS (AVA 0.9, mean 31 mmHg), mild MR;    cath 1/12:  normal cors, AVA 1.0, peak to peak gradiet for AV 31 mmHg, mod-severe AS, EF 35-40%  . 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 Hx MRSA infection     thigh abcess 11/06  . Anxiety   . Aortic stenosis     AVR 11/29/11  . HTN (hypertension)   . DM2 (diabetes mellitus, type 2)   . HLD (hyperlipidemia)   . Gout   . Chronic diastolic heart failure     Echo 09/2012: mod LVH, EF 55-65%, Gr 1 DD, AVR ok, mild LAE.    Past Surgical History  Procedure  Laterality Date  . Laminectomy  10/30/83  . Aortic valve replacement  11/29/2011    Procedure: AORTIC VALVE REPLACEMENT (AVR);  Surgeon: [name], MD;  Location: [facility] OR;  Service: Open Heart Surgery;  Laterality: N/A;  with nitric oxide  . Right heart catheterization N/A 11/28/2011    Procedure: RIGHT HEART CATH;  Surgeon: [name], MD;  Location: [facility] CATH LAB;  Service: Cardiovascular;  Laterality: N/A;    Current Outpatient Prescriptions  Medication Sig Dispense Refill  . allopurinol (ZYLOPRIM) 300 MG tablet Take 300 mg by mouth daily.   0  . atorvastatin (LIPITOR) 40 MG tablet Take 40 mg by mouth daily at 6 PM.     . BAYER CONTOUR NEXT TEST test strip 1 each by Other route as needed. for testing  1  . buPROPion (WELLBUTRIN XL) 300 MG 24 hr tablet Take 300 mg by mouth daily.   0  . colchicine 0.6 MG tablet Take 0.6 mg by mouth daily.     . febuxostat (ULORIC) 40 MG tablet Take 80 mg by mouth daily. Patient taking two 40 mg tablets by mouth daily.    . furosemide (LASIX)  80 MG tablet Take 80 mg by mouth 2 (two) times daily.    Marland Kitchen HYDROcodone-acetaminophen (NORCO) 7.5-325 MG per tablet Take 1 tablet by mouth every 4 (four) hours as needed for moderate pain.     Marland Kitchen insulin glargine (LANTUS) 100 UNIT/ML injection Inject 60 Units into the skin 2 (two) times daily. As directed    . lisinopril (PRINIVIL,ZESTRIL) 20 MG tablet Take by mouth.    . terbinafine (LAMISIL) 1 % cream Apply topically.     No current facility-administered medications for this visit.    Allergies:   Review of patient's allergies indicates no known allergies.    Social History:  The patient  reports that he has never smoked. He does not have any smokeless tobacco history on file. He reports that he drinks alcohol.   Family History:  The patient's family history includes Cancer in his mother; Heart attack in his father.    ROS:  Please see the history of present illness. All other systems are reviewed  and negative.    PHYSICAL EXAM: VS:  BP 120/70 mmHg  Pulse 86  Ht  (1.803 m)  Wt 374 lb 6.4 oz (169.827 kg)  BMI 52.24 kg/m2 , BMI Body mass index is 52.24 kg/(m^2). GEN: Well nourished, well developed, in no acute distress HEENT: normal Neck: no JVD, carotid bruits, or masses Cardiac: RRR; no murmurs, rubs, or gallops,no edema  Respiratory:  clear to auscultation bilaterally, normal work of breathing GI: Obese, firm, nontender, + BS MS: no deformity or atrophy Skin: warm and dry, no rash Neuro:  Strength and sensation are intact Psych: euthymic mood, full affect   EKG:  EKG is ordered today. 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  Recent Labs: 03/25/2014: Pro B Natriuretic peptide (BNP) 42.0 04/02/2014: BUN 33*; Creatinine 1.5; Potassium 4.5; Sodium 140    Lipid Panel    Component Value Date/Time   CHOL 112 11/27/2011 0520   TRIG 161* 11/27/2011 0520   HDL 26* 11/27/2011 0520   CHOLHDL 4.3 11/27/2011 0520   VLDL 32 11/27/2011 0520   LDLCALC 54 11/27/2011 0520   LDLDIRECT 106* 09/03/2007 1943     Wt Readings from Last 3 Encounters:  02/18/15 374 lb 6.4 oz (169.827 kg)  04/02/14 368 lb (166.924 kg)  03/25/14 381 lb (172.82 kg)     Other studies Reviewed: Additional studies/ records that were reviewed today include: Previous office visits and labs  ASSESSMENT AND PLAN: [name] was seen by Dr. Ladona Ridgel   1.  Acute on chronic diastolic CHF: He has unintentional sodium indiscretions because he eats out all the time and has fluid indiscretions with drinking large amounts of water as well. He has been off his Lasix ever since being released from the [facility] program. He has not had any recent labs. Do not believe we can effectively diurese him as an outpatient and believe that he needs inpatient admission with IV Lasix. Discussed dietary compliance with  salt and fluid.  2. History of acute renal failure: During the period of time that he was in the [facility], he had a gout flare and was placed on high doses of Aleve. He had an episode of acute renal failure requiring hospitalization. His peak creatinine was 9.15 in October 2015. His renal function will need to be watched very closely. Last labs  are BUN 39 creatinine 1.15 on 01/25/2015.  3. Bioprosthetic AVR: On physical exam, there is only a mild murmur. It is been a year since his last echocardiogram. We'll repeat this and continue to follow.  4. Diabetes: Continue insulin with sliding scale but will decrease the doses significantly to try and avoid hypoglycemia.  5. Abnormal ECG: His PR interval Is actually shorter than it was in his previous ECG, but his QRS duration has increased from 110 ms to 152 ms. Continue to follow, he may have worsening conduction system disease.  Otherwise, continue home medications. He has significant problems with gout so will need to be on his home meds.  Current medicines are reviewed at length with the patient today.  The patient does not have concerns regarding medicines.  The following changes have been made:  no change, due to admission  Labs/ tests ordered today include:   Orders Placed This Encounter  Procedures  . EKG 12-Lead     Disposition:   FU with Dr. Clifton James after hospital release.  Melida Quitter, PA-C  02/18/2015 3:44 PM    [facility] [address] [place], [place], [place]  [address] Phone: [phone]; Fax: [phone]    Cardiology Attending  Patient seen and examined. I have reviewed the situation with Theodore Demark, PA-C. The patient has gained almost 30 lbs over the past few weeks and has developed increased sob and peripheral edema. He has gained nearly 30 lbs. I have recommended he undergo admission with IV lasix therapy. Additional treatments including right heart cath will depend on his  response therapy. I have carefully reviewed the importance of a low sodium diet with the patient.   Leonia Reeves.D.

## 2015-02-18 NOTE — Telephone Encounter (Signed)
Scheduled patient for 1:30 today; left him a message to call back to inform.

## 2015-02-18 NOTE — H&P (Signed)
Cardiology Office Note   Date:  02/18/2015   ID:  Antonio Chrisler., DOB [birth year], MRN [id number]  PCP:  [name], MD  Cardiologist:  Dr. Margart Sickles, PA-C   Chief Complaint  Patient presents with  . Edema    History of Present Illness: Antonio Furrh. is a 64 y.o. male with a history of severe AS, s/p bioprosthetic AVR in 10/2011, combined systolic and diastolic CHF, NICM, DM, HTN, HL, OSA. He did well after AVR but required several more weeks in the hospital for diuresis for volume excess. He had post op AFib requiring Amiodarone.EF improved after his AVR.   He was in the [employer] program and left at 346 lbs about 3 weeks ago. The records may reflect 326 lbs but pt says he was very hungry and eating more protein than the program allowed, so he began fudging on his weights. When he left, he was not on furosemide.   Antonio Smothers. presents for weight gain and increasing edema. His weight has gone up steadily since discharge from the facility, 374.4 lbs. He has been eating out a lot, but does not add salt. No ETOH, has been drinking 6-8 pints of water per day.   He denies SOB, sleeps on 3-4 pillows chronically due to back problems, no recent change. No DOE, PND, no true orthopnea.    Past Medical History  Diagnosis Date  . Hepatitis   . NICM (nonischemic cardiomyopathy)     echo 12/11: EF 45-50%, grade 1 diast dysfxn, moderately severe AS (AVA 0.9, mean 31 mmHg), mild MR;    cath 1/12:  normal cors, AVA 1.0, peak to peak gradiet for AV 31 mmHg, mod-severe AS, EF 35-40%  . 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 Hx MRSA infection     thigh abcess 11/06  . Anxiety   . Aortic stenosis     AVR 11/29/11  . HTN (hypertension)   . DM2 (diabetes mellitus, type 2)   . HLD (hyperlipidemia)   . Gout   . Chronic diastolic heart failure     Echo 09/2012: mod LVH, EF 55-65%, Gr 1 DD, AVR ok, mild LAE.    Past Surgical History  Procedure  Laterality Date  . Laminectomy  10/30/83  . Aortic valve replacement  11/29/2011    Procedure: AORTIC VALVE REPLACEMENT (AVR);  Surgeon: [name], MD;  Location: [facility] OR;  Service: Open Heart Surgery;  Laterality: N/A;  with nitric oxide  . Right heart catheterization N/A 11/28/2011    Procedure: RIGHT HEART CATH;  Surgeon: [name], MD;  Location: [facility] CATH LAB;  Service: Cardiovascular;  Laterality: N/A;    Current Outpatient Prescriptions  Medication Sig Dispense Refill  . allopurinol (ZYLOPRIM) 300 MG tablet Take 300 mg by mouth daily.   0  . atorvastatin (LIPITOR) 40 MG tablet Take 40 mg by mouth daily at 6 PM.     . BAYER CONTOUR NEXT TEST test strip 1 each by Other route as needed. for testing  1  . buPROPion (WELLBUTRIN XL) 300 MG 24 hr tablet Take 300 mg by mouth daily.   0  . colchicine 0.6 MG tablet Take 0.6 mg by mouth daily.     . febuxostat (ULORIC) 40 MG tablet Take 80 mg by mouth daily. Patient taking two 40 mg tablets by mouth daily.    . furosemide (LASIX)  80 MG tablet Take 80 mg by mouth 2 (two) times daily.    Marland Kitchen HYDROcodone-acetaminophen (NORCO) 7.5-325 MG per tablet Take 1 tablet by mouth every 4 (four) hours as needed for moderate pain.     Marland Kitchen insulin glargine (LANTUS) 100 UNIT/ML injection Inject 60 Units into the skin 2 (two) times daily. As directed    . lisinopril (PRINIVIL,ZESTRIL) 20 MG tablet Take by mouth.    . terbinafine (LAMISIL) 1 % cream Apply topically.     No current facility-administered medications for this visit.    Allergies:   Review of patient's allergies indicates no known allergies.    Social History:  The patient  reports that he has never smoked. He does not have any smokeless tobacco history on file. He reports that he drinks alcohol.   Family History:  The patient's family history includes Cancer in his mother; Heart attack in his father.    ROS:  Please see the history of present illness. All other systems are reviewed  and negative.    PHYSICAL EXAM: VS:  BP 120/70 mmHg  Pulse 86  Ht  (1.803 m)  Wt 374 lb 6.4 oz (169.827 kg)  BMI 52.24 kg/m2 , BMI Body mass index is 52.24 kg/(m^2). GEN: Well nourished, well developed, in no acute distress HEENT: normal Neck: no JVD, carotid bruits, or masses Cardiac: RRR; no murmurs, rubs, or gallops,no edema  Respiratory:  clear to auscultation bilaterally, normal work of breathing GI: Obese, firm, nontender, + BS MS: no deformity or atrophy Skin: warm and dry, no rash Neuro:  Strength and sensation are intact Psych: euthymic mood, full affect   EKG:  EKG is ordered today. 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  Recent Labs: 03/25/2014: Pro B Natriuretic peptide (BNP) 42.0 04/02/2014: BUN 33*; Creatinine 1.5; Potassium 4.5; Sodium 140    Lipid Panel    Component Value Date/Time   CHOL 112 11/27/2011 0520   TRIG 161* 11/27/2011 0520   HDL 26* 11/27/2011 0520   CHOLHDL 4.3 11/27/2011 0520   VLDL 32 11/27/2011 0520   LDLCALC 54 11/27/2011 0520   LDLDIRECT 106* 09/03/2007 1943     Wt Readings from Last 3 Encounters:  02/18/15 374 lb 6.4 oz (169.827 kg)  04/02/14 368 lb (166.924 kg)  03/25/14 381 lb (172.82 kg)     Other studies Reviewed: Additional studies/ records that were reviewed today include: Previous office visits and labs  ASSESSMENT AND PLAN: Antonio [name] was seen by Dr. Ladona Ridgel   1.  Acute on chronic diastolic CHF: He has unintentional sodium indiscretions because he eats out all the time and has fluid indiscretions with drinking large amounts of water as well. He has been off his Lasix ever since being released from the [facility] program. He has not had any recent labs. Do not believe we can effectively diurese him as an outpatient and believe that he needs inpatient admission with IV Lasix. Discussed dietary compliance with  salt and fluid.  2. History of acute renal failure: During the period of time that he was in the [facility], he had a gout flare and was placed on high doses of Aleve. He had an episode of acute renal failure requiring hospitalization. His peak creatinine was 9.15 in October 2015. His renal function will need to be watched very closely. Last labs  are BUN 39 creatinine 1.15 on 01/25/2015.  3. Bioprosthetic AVR: On physical exam, there is only a mild murmur. It is been a year since his last echocardiogram. We'll repeat this and continue to follow.  4. Diabetes: Continue insulin with sliding scale but will decrease the doses significantly to try and avoid hypoglycemia.  5. Abnormal ECG: His PR interval Is actually shorter than it was in his previous ECG, but his QRS duration has increased from 110 ms to 152 ms. Continue to follow, he may have worsening conduction system disease.  Otherwise, continue home medications. He has significant problems with gout so will need to be on his home meds.  Current medicines are reviewed at length with the patient today.  The patient does not have concerns regarding medicines.  Disposition:   FU with Dr. Clifton James after hospital release.  Melida Quitter, PA-C  02/18/2015 3:44 PM    [facility] [address] [place], [place], [place]  [address] Phone: [phone]; Fax: [phone]

## 2015-02-18 NOTE — Patient Instructions (Signed)
You are being admitted to [facility]- [address] [place], Room 7.

## 2015-02-18 NOTE — Telephone Encounter (Signed)
Routed to [employer] DOD assist and called to nurse in pod to informed.

## 2015-02-19 DIAGNOSIS — I5033 Acute on chronic diastolic (congestive) heart failure: Secondary | ICD-10-CM

## 2015-02-19 LAB — GLUCOSE, CAPILLARY
GLUCOSE-CAPILLARY: 222 mg/dL — AB (ref 70–99)
GLUCOSE-CAPILLARY: 206 mg/dL — AB (ref 70–99)
Glucose-Capillary: 130 mg/dL — ABNORMAL HIGH (ref 70–99)
Glucose-Capillary: 245 mg/dL — ABNORMAL HIGH (ref 70–99)

## 2015-02-19 LAB — BASIC METABOLIC PANEL
Anion gap: 11 (ref 5–15)
BUN: 25 mg/dL — ABNORMAL HIGH (ref 6–23)
CO2: 29 mmol/L (ref 19–32)
Calcium: 8.8 mg/dL (ref 8.4–10.5)
Chloride: 103 mmol/L (ref 96–112)
Creatinine, Ser: 1.02 mg/dL (ref 0.50–1.35)
GFR calc Af Amer: 88 mL/min — ABNORMAL LOW (ref >90)
GFR calc non Af Amer: 76 mL/min — ABNORMAL LOW (ref >90)
GLUCOSE: 126 mg/dL — AB (ref 70–99)
Potassium: 3.7 mmol/L (ref 3.5–5.1)
SODIUM: 143 mmol/L (ref 135–145)

## 2015-02-19 LAB — MRSA PCR SCREENING: MRSA BY PCR: NEGATIVE

## 2015-02-19 MED ORDER — POTASSIUM CHLORIDE CRYS ER 20 MEQ PO TBCR
40.0000 meq | EXTENDED_RELEASE_TABLET | Freq: Once | ORAL | Status: AC
  Administered 2015-02-19: 40 meq via ORAL
  Filled 2015-02-19: qty 2

## 2015-02-19 NOTE — Progress Notes (Signed)
Subjective: Feeling better.  Objective: Vital signs in last 24 hours: Temp:  [98.2 F (36.8 C)-98.7 F (37.1 C)] 98.5 F (36.9 C) (04/23 0650) Pulse Rate:  [86-96] 90 (04/23 0650) Resp:  [18-20] 18 (04/23 0650) BP: (120-160)/(54-91) 160/89 mmHg (04/23 0650) SpO2:  [95 %-97 %] 97 % (04/23 0650) Weight:  [365 lb (165.563 kg)-374 lb 6.4 oz (169.827 kg)] 365 lb (165.563 kg) (04/23 0650) Last BM Date: 02/17/15  Intake/Output from previous day: 04/22 0701 - 04/23 0700 In: 300 [P.O.:300] Out: 4000 [Urine:4000] Intake/Output this shift: Total I/O In: 222 [P.O.:222] Out: -   Medications Scheduled Meds: . atorvastatin  40 mg Oral q1800  . buPROPion  300 mg Oral Daily  . colchicine  0.6 mg Oral Daily  . enoxaparin (LOVENOX) injection  40 mg Subcutaneous Q24H  . febuxostat  80 mg Oral Daily  . furosemide  80 mg Intravenous BID  . insulin aspart  0-15 Units Subcutaneous TID WC  . insulin glargine  60 Units Subcutaneous BID  . metolazone  2.5 mg Oral Daily  . sodium chloride  3 mL Intravenous Q12H   Continuous Infusions:  PRN Meds:.sodium chloride, acetaminophen, ALPRAZolam, HYDROcodone-acetaminophen, nitroGLYCERIN, ondansetron (ZOFRAN) IV, sodium chloride, zolpidem  PE: General appearance: alert, cooperative and no distress Lungs: clear to auscultation bilaterally Heart: regular rate and rhythm and 1/6 sys MM Extremities: 2+ pedal edema Pulses: 2+ and symmetric Skin: Warm and dry Neurologic: Grossly normal  Lab Results:   Recent Labs  02/18/15 2116  WBC 9.5  HGB 11.6*  HCT 36.6*  PLT PLATELET CLUMPS NOTED ON SMEAR, UNABLE TO ESTIMATE   BMET  Recent Labs  02/18/15 2116 02/19/15 0558  NA 139 143  K 4.4 3.7  CL 101 103  CO2 30 29  GLUCOSE 305* 126*  BUN 29* 25*  CREATININE 1.26 1.02  CALCIUM 9.1 8.8   PT/INR  Recent Labs  02/18/15 2116  LABPROT 15.7*  INR 1.24     Assessment/Plan  64 y.o. male with a history of severe AS, s/p bioprosthetic  AVR in 10/2011, combined systolic and diastolic CHF, NICM, DM, HTN, HL, OSA. He did well after AVR but required several more weeks in the hospital for diuresis for volume excess. He had post op AFib requiring Amiodarone.EF improved after his AVR.   He was in the [employer] program and left at 346 lbs about 3 weeks ago. The records may reflect 326 lbs but pt says he was very hungry and eating more protein than the program allowed, so he began fudging on his weights. When he left, he was not on furosemide.   He presented for weight gain and increasing edema. His weight has gone up steadily since discharge from the facility, 374.4 lbs. He has been eating out a lot, but does not add salt. No ETOH, has been drinking 6-8 pints of water per day.   Principal Problem:   Acute on chronic diastolic CHF (congestive heart failure), NYHA class 3 Net fluids:  -3.7L.  SCr stable.  K decreased but still WNL .  Add daily K.  On Lasix  IV BID and 2.5 metolazone ( x1) .   Continue meds.  DC in 24-48 hrs.    Diabetes mellitus, type II, insulin dependent   Gout   HYPERTENSION, BENIGN SYSTEMIC  120/70-160/89.  Continue to monitor.    Sleep apnea     LOS: 1 day    HAGER, BRYAN PA-C 02/19/2015 9:14 AM  Attending Note:  The patient was seen and examined.  Agree with assessment and plan as noted above.  Changes made to the above note as needed.  Acute on chronic systolic chf:   Diuresing well.  Renal function is stable so far.  Replace K   Would not give metalazone daily - in fact, would try to avoid if possible given his hx of ARF   He currently is not scheduled to get metolazone regularly.    Vesta Mixer, Montez Hageman., MD, [facility] 02/19/2015, 12:12 PM [address],  [address] Office [phone] Pager [phone]

## 2015-02-19 NOTE — Progress Notes (Signed)
MRSA swab negative Patient contact isolation D/C. Charge nurse informed.

## 2015-02-20 DIAGNOSIS — I10 Essential (primary) hypertension: Secondary | ICD-10-CM

## 2015-02-20 DIAGNOSIS — I501 Left ventricular failure: Secondary | ICD-10-CM

## 2015-02-20 LAB — BASIC METABOLIC PANEL
Anion gap: 9 (ref 5–15)
BUN: 25 mg/dL — ABNORMAL HIGH (ref 6–23)
CALCIUM: 9 mg/dL (ref 8.4–10.5)
CO2: 35 mmol/L — AB (ref 19–32)
CREATININE: 1.11 mg/dL (ref 0.50–1.35)
Chloride: 95 mmol/L — ABNORMAL LOW (ref 96–112)
GFR calc non Af Amer: 68 mL/min — ABNORMAL LOW (ref >90)
GFR, EST AFRICAN AMERICAN: 79 mL/min — AB (ref >90)
Glucose, Bld: 126 mg/dL — ABNORMAL HIGH (ref 70–99)
POTASSIUM: 3.9 mmol/L (ref 3.5–5.1)
SODIUM: 139 mmol/L (ref 135–145)

## 2015-02-20 LAB — GLUCOSE, CAPILLARY
GLUCOSE-CAPILLARY: 199 mg/dL — AB (ref 70–99)
Glucose-Capillary: 168 mg/dL — ABNORMAL HIGH (ref 70–99)
Glucose-Capillary: 139 mg/dL — ABNORMAL HIGH (ref 70–99)

## 2015-02-20 NOTE — Progress Notes (Signed)
  Echocardiogram 2D Echocardiogram has been performed.  Shellia Cleverly 02/20/2015, 12:13 PM

## 2015-02-20 NOTE — Progress Notes (Signed)
Subjective: Feeling better.  Objective: Vital signs in last 24 hours: Temp:  [97.9 F (36.6 C)-98.6 F (37 C)] 98 F (36.7 C) (04/24 0524) Pulse Rate:  [83-89] 89 (04/24 0524) Resp:  [20] 20 (04/24 0524) BP: (111-134)/(63-81) 111/66 mmHg (04/24 1035) SpO2:  [92 %-96 %] 95 % (04/24 0524) Weight:  [360 lb (163.295 kg)] 360 lb (163.295 kg) (04/24 0524) Last BM Date: 02/17/15  Intake/Output from previous day: 04/23 0701 - 04/24 0700 In: 1122 [P.O.:1122] Out: 3800 [Urine:3800] Intake/Output this shift: Total I/O In: 240 [P.O.:240] Out: 700 [Urine:700]  Medications Scheduled Meds: . atorvastatin  40 mg Oral q1800  . buPROPion  300 mg Oral Daily  . colchicine  0.6 mg Oral Daily  . enoxaparin (LOVENOX) injection  40 mg Subcutaneous Q24H  . febuxostat  80 mg Oral Daily  . furosemide  80 mg Intravenous BID  . insulin aspart  0-15 Units Subcutaneous TID WC  . insulin glargine  60 Units Subcutaneous BID  . sodium chloride  3 mL Intravenous Q12H   Continuous Infusions:  PRN Meds:.sodium chloride, acetaminophen, ALPRAZolam, HYDROcodone-acetaminophen, nitroGLYCERIN, ondansetron (ZOFRAN) IV, sodium chloride, zolpidem  PE: General appearance: alert, cooperative and no distress Lungs: clear to auscultation bilaterally Heart: regular rate and rhythm and 1/6 sys MM Extremities: 2+ pedal edema Pulses: 2+ and symmetric Skin: Warm and dry Neurologic: Grossly normal  Lab Results:   Recent Labs  02/18/15 2116  WBC 9.5  HGB 11.6*  HCT 36.6*  PLT PLATELET CLUMPS NOTED ON SMEAR, UNABLE TO ESTIMATE   BMET  Recent Labs  02/18/15 2116 02/19/15 0558 02/20/15 0707  NA 139 143 139  K 4.4 3.7 3.9  CL 101 103 95*  CO2 30 29 35*  GLUCOSE 305* 126* 126*  BUN 29* 25* 25*  CREATININE 1.26 1.02 1.11  CALCIUM 9.1 8.8 9.0   PT/INR  Recent Labs  02/18/15 2116  LABPROT 15.7*  INR 1.24     Assessment/Plan  64 y.o. male with a history of severe AS, s/p bioprosthetic AVR  in 10/2011, combined systolic and diastolic CHF, NICM, DM, HTN, HL, OSA. He did well after AVR but required several more weeks in the hospital for diuresis for volume excess. He had post op AFib requiring Amiodarone.EF improved after his AVR.   He was in the [employer] program and left at 346 lbs about 3 weeks ago. The records may reflect 326 lbs but pt says he was very hungry and eating more protein than the program allowed, so he began fudging on his weights. When he left, he was not on furosemide.   He presented for weight gain and increasing edema. His weight has gone up steadily since discharge from the facility, 374.4 lbs. He has been eating out a lot, but does not add salt. No ETOH, has been drinking 6-8 pints of water per day.   Principal Problem:   Acute on chronic diastolic CHF (congestive heart failure), NYHA class 3 Echo is being done this am Requiring definity = images are very poor.t Continue IV lasix,  Still diuresing well.   Intake/Output Summary (Last 24 hours) at 02/20/15 1156 Last data filed at 02/20/15 0857  Gross per 24 hour  Intake    900 ml  Output   3400 ml  Net  -2500 ml      Diabetes mellitus, type II, insulin dependent   Gout   HYPERTENSION, BENIGN SYSTEMIC  120/70-160/89.  Continue to monitor.    Sleep apnea  LOS: 2 days     Alvia Grove., MD, [facility] 02/20/2015, 11:56 AM [address],  [address] Office [phone] Pager [phone]

## 2015-02-20 NOTE — Progress Notes (Signed)
Utilization review completed.  

## 2015-02-21 LAB — GLUCOSE, CAPILLARY
GLUCOSE-CAPILLARY: 188 mg/dL — AB (ref 70–99)
Glucose-Capillary: 87 mg/dL (ref 70–99)
Glucose-Capillary: 232 mg/dL — ABNORMAL HIGH (ref 70–99)
Glucose-Capillary: 227 mg/dL — ABNORMAL HIGH (ref 70–99)

## 2015-02-21 LAB — BASIC METABOLIC PANEL
ANION GAP: 14 (ref 5–15)
BUN: 26 mg/dL — AB (ref 6–23)
CO2: 30 mmol/L (ref 19–32)
Calcium: 8.8 mg/dL (ref 8.4–10.5)
Chloride: 91 mmol/L — ABNORMAL LOW (ref 96–112)
Creatinine, Ser: 1.13 mg/dL (ref 0.50–1.35)
GFR calc Af Amer: 78 mL/min — ABNORMAL LOW (ref >90)
GFR calc non Af Amer: 67 mL/min — ABNORMAL LOW (ref >90)
GLUCOSE: 75 mg/dL (ref 70–99)
Potassium: 3.5 mmol/L (ref 3.5–5.1)
SODIUM: 135 mmol/L (ref 135–145)

## 2015-02-21 LAB — HEMOGLOBIN A1C
Hgb A1c MFr Bld: 10.1 % — ABNORMAL HIGH (ref 4.8–5.6)
Mean Plasma Glucose: 243 mg/dL

## 2015-02-21 MED ORDER — DOCUSATE SODIUM 100 MG PO CAPS
100.0000 mg | ORAL_CAPSULE | Freq: Once | ORAL | Status: AC
  Administered 2015-02-21: 100 mg via ORAL
  Filled 2015-02-21 (×2): qty 1

## 2015-02-21 NOTE — Progress Notes (Signed)
CARE MANAGEMENT NOTE 02/21/2015  Patient:  Antonio [name], Antonio [name]   Account Number:  [id number]  Date Initiated:  02/21/2015  Documentation initiated by:  [place]  Subjective/Objective Assessment:   CHF     Action/Plan:   lives alone   Anticipated DC Date:  02/23/2015   Anticipated DC Plan:  HOME/SELF CARE      DC Planning Services  CM consult      Choice offered to / List presented to:             Status of service:  Completed, signed off Medicare Important Message given?  YES (If response is "NO", the following Medicare IM given date fields will be blank) Date Medicare IM given:  02/21/2015 Medicare IM given by:  [facility] Date Additional Medicare IM given:   Additional Medicare IM given by:    Discharge Disposition:  HOME/SELF CARE  Per UR Regulation:    If discussed at Long Length of Stay Meetings, dates discussed:    Comments:  02/21/2015 1600 NCM spoke to pt and states he lives alone. Has RW at home. He plans to pick up digital scale at [facility] after dc. States his scales only weighs up to 330 lbs. NCM educated the importance of daily weights and reporting any 3-5 lb weight gain to his MD. He states he has been in CHF studies in the past and is familiar with foods to eat and monitoring his sodium intake.  States he can purchase his meds. No NCM needs identified. Isidoro Donning RN CCM Case Mgmt phone [phone]

## 2015-02-21 NOTE — Progress Notes (Signed)
Pt Profile: 64 y.o. male with a history of severe AS, s/p bioprosthetic AVR in 10/2011, combined systolic and diastolic CHF, NICM, DM, HTN, HL, OSA. He did well after AVR but required several more weeks in the hospital for diuresis for volume excess. He had post op AFib requiring Amiodarone.EF improved after his AVR.   He was in the [employer] program and left at 346 lbs about 3 weeks ago. The records may reflect 326 lbs but pt says he was very hungry and eating more protein than the program allowed, so he began fudging on his weights. When he left, he was not on furosemide.   He presented for weight gain and increasing edema. His weight has gone up steadily since discharge from the facility, 374.4 lbs. He has been eating out a lot, but does not add salt. No ETOH, has been drinking 6-8 pints of water per day.    Subjective: He feels he has 10 more lbs of fluid onboard.  Objective: Vital signs in last 24 hours: Temp:  [97.7 F (36.5 C)-98.3 F (36.8 C)] 97.7 F (36.5 C) (04/25 0652) Pulse Rate:  [81-85] 85 (04/25 0652) Resp:  [18] 18 (04/25 0652) BP: (111-129)/(60-81) 129/60 mmHg (04/25 0652) SpO2:  [94 %-95 %] 94 % (04/25 0652) Weight:  [354 lb (160.573 kg)] 354 lb (160.573 kg) (04/25 0652) Weight change: -6 lb (-2.722 kg) Last BM Date: 02/20/15 Intake/Output from previous day: -2370  (-8448 since admit) wt down from 365 to 354 today 04/24 0701 - 04/25 0700 In: 480 [P.O.:480] Out: 2850 [Urine:2850] Intake/Output this shift: Total I/O In: 600 [P.O.:600] Out: 0   PE: General:Pleasant affect, NAD Skin:Warm and dry, brisk capillary refill HEENT:normocephalic, sclera clear, mucus membranes moist Neck:supple, no JVD  Heart:S1S2 RRR with soft systolic murmur 1/6, no gallup, rub or click Lungs:clear without rales, rhonchi, or wheezes ONG:EXBM, non tender, + BS, do not palpate liver spleen or masses Ext:no to tr lower ext edema, mild edema upper thighs tr.  2+ pedal  pulses, 2+ radial pulses has blisters of fluid on both feet, on Lt foot with blood, he stated this was his normal with edema Neuro:alert and oriented X 3, MAE, follows commands, + facial symmetry Tele: SR RBBB  Lab Results:  Recent Labs  02/18/15 2116  WBC 9.5  HGB 11.6*  HCT 36.6*  PLT PLATELET CLUMPS NOTED ON SMEAR, UNABLE TO ESTIMATE   BMET  Recent Labs  02/20/15 0707 02/21/15 0511  NA 139 135  K 3.9 3.5  CL 95* 91*  CO2 35* 30  GLUCOSE 126* 75  BUN 25* 26*  CREATININE 1.11 1.13  CALCIUM 9.0 8.8   No results for input(s): TROPONINI in the last 72 hours.  Invalid input(s): CK, MB  Lab Results  Component Value Date   CHOL 112 11/27/2011   HDL 26* 11/27/2011   LDLCALC 54 11/27/2011   LDLDIRECT 106* 09/03/2007   TRIG 161* 11/27/2011   CHOLHDL 4.3 11/27/2011   Lab Results  Component Value Date   HGBA1C 8.3* 01/02/2014     Lab Results  Component Value Date   TSH 4.928* 02/18/2015    Hepatic Function Panel  Recent Labs  02/18/15 2116  PROT 6.9  ALBUMIN 3.3*  AST 24  ALT 21  ALKPHOS 134*  BILITOT 0.3   No results for input(s): CHOL in the last 72 hours. No results for input(s): PROTIME in the last 72 hours.     Studies/Results:  ECHO:  Study Conclusions  - Left ventricle: The cavity size was normal. Wall thickness was normal. The estimated ejection fraction was 55%. - Aortic valve: A bioprosthesis was present. Valve area (VTI): 1.09 cm^2. Valve area (Vmax): 1.22 cm^2. Valve area (Vmean): 1 cm^2. - Left atrium: The atrium was moderately dilated. - Right ventricle: Systolic function was mildly to moderately reduced. - Impressions: Limited study due to poor sound wave transmission secondary to patient&'s body habitus. Impressions: - Limited study due to poor sound wave transmission secondary to patient&'s body habitus.   Medications: I have reviewed the patient's current medications. Scheduled Meds: . atorvastatin  40 mg Oral  q1800  . buPROPion  300 mg Oral Daily  . colchicine  0.6 mg Oral Daily  . enoxaparin (LOVENOX) injection  40 mg Subcutaneous Q24H  . febuxostat  80 mg Oral Daily  . furosemide  80 mg Intravenous BID  . insulin aspart  0-15 Units Subcutaneous TID WC  . insulin glargine  60 Units Subcutaneous BID  . sodium chloride  3 mL Intravenous Q12H   Continuous Infusions:  PRN Meds:.sodium chloride, acetaminophen, ALPRAZolam, HYDROcodone-acetaminophen, nitroGLYCERIN, ondansetron (ZOFRAN) IV, sodium chloride, zolpidem  Assessment/Plan: Acute on chronic diastolic CHF (congestive heart failure), NYHA class 3 - Echo see above with EF 55% --8,448 since admit and wt down 11 lbs on lasix 80 mg BID for today then change to po in AM- his normal outpt Lasix 80 po BID, but was not on once out of [facility].  -walking in hall without problems  Diabetes mellitus, type II, insulin dependent- on lantus -glucose stable 130-245 improved from admit   Gout- no complaints currently   HYPERTENSION, BENIGN SYSTEMIC 120/66-129/60 stable. Continue to monitor.    Sleep apnea  LOS: 3 days   Time spent with pt. : 15 minutes. [facility] R  Nurse Practitioner Certified Pager [phone] or after 5pm and on weekends call [phone] 02/21/2015, 9:39 AM  Patient seen, examined. Available data reviewed. Agree with findings, assessment, and plan as outlined by Nada Boozer, NP. The patient is independently interviewed and examined. Exam reveals an alert, oriented, obese male in no distress. Lung fields are clear. Heart is regular. There is 1+ pretibial edema. The patient is making progress with his acute on chronic diastolic heart failure. Will continue IV furosemide, close monitoring of renal function, plan on transitioning to oral furosemide in the next 24-48 hours. We discussed the importance of sodium restriction, daily weights at home, and we will plan on discharging him with a sliding scale diuretic  program.  Tonny Bollman, M.D. 02/21/2015 2:00 PM

## 2015-02-21 NOTE — Progress Notes (Signed)
Chaplain responded to spiritual care consult for advanced directive. Pt wants to make his two daughter healthcare powers of attorney and make some adjustments to living will. Chaplain brought advanced directive packet to pt and explained contents. Chaplain informed pt to have nurse page chaplain when advanced directive is ready to be signed. Page chaplain as needed.    02/21/15 1500  Clinical Encounter Type  Visited With Patient  Visit Type Initial;Spiritual support  Referral From Nurse  Spiritual Encounters  Spiritual Needs Emotional  Stress Factors  Patient Stress Factors Health changes  Advance Directives (For Healthcare)  Does patient have an advance directive? Yes  Type of Advance Directive Healthcare Power of Attorney  Does patient want to make changes to advanced directive? Yes - information given  Antonio [name] 02/21/2015 3:39 PM

## 2015-02-22 DIAGNOSIS — Z953 Presence of xenogenic heart valve: Secondary | ICD-10-CM

## 2015-02-22 LAB — BASIC METABOLIC PANEL
Anion gap: 12 (ref 5–15)
BUN: 37 mg/dL — AB (ref 6–23)
CO2: 31 mmol/L (ref 19–32)
Calcium: 9 mg/dL (ref 8.4–10.5)
Chloride: 94 mmol/L — ABNORMAL LOW (ref 96–112)
Creatinine, Ser: 1.33 mg/dL (ref 0.50–1.35)
GFR calc Af Amer: 64 mL/min — ABNORMAL LOW (ref >90)
GFR calc non Af Amer: 55 mL/min — ABNORMAL LOW (ref >90)
GLUCOSE: 80 mg/dL (ref 70–99)
Potassium: 3.4 mmol/L — ABNORMAL LOW (ref 3.5–5.1)
Sodium: 137 mmol/L (ref 135–145)

## 2015-02-22 LAB — GLUCOSE, CAPILLARY
GLUCOSE-CAPILLARY: 226 mg/dL — AB (ref 70–99)
GLUCOSE-CAPILLARY: 205 mg/dL — AB (ref 70–99)
Glucose-Capillary: 78 mg/dL (ref 70–99)
Glucose-Capillary: 144 mg/dL — ABNORMAL HIGH (ref 70–99)

## 2015-02-22 MED ORDER — POTASSIUM CHLORIDE CRYS ER 20 MEQ PO TBCR
40.0000 meq | EXTENDED_RELEASE_TABLET | Freq: Every day | ORAL | Status: DC
  Administered 2015-02-22 – 2015-02-23 (×2): 40 meq via ORAL
  Filled 2015-02-22 (×2): qty 2

## 2015-02-22 MED ORDER — FUROSEMIDE 80 MG PO TABS
80.0000 mg | ORAL_TABLET | Freq: Two times a day (BID) | ORAL | Status: DC
  Administered 2015-02-22 – 2015-02-23 (×2): 80 mg via ORAL
  Filled 2015-02-22 (×5): qty 1

## 2015-02-22 MED ORDER — ENOXAPARIN SODIUM 80 MG/0.8ML ~~LOC~~ SOLN
80.0000 mg | SUBCUTANEOUS | Status: DC
  Administered 2015-02-22: 80 mg via SUBCUTANEOUS
  Filled 2015-02-22 (×3): qty 0.8

## 2015-02-22 MED FILL — Perflutren Lipid Microsphere IV Susp 1.1 MG/ML: INTRAVENOUS | Qty: 10 | Status: AC

## 2015-02-22 NOTE — Progress Notes (Signed)
Heart Failure Navigator Consult Note  Presentation: Antonio [name] is a 64 y.o. male with a history of severe AS, s/p bioprosthetic AVR in 10/2011, combined systolic and diastolic CHF, NICM, DM, HTN, HL, OSA. He did well after AVR but required several more weeks in the hospital for diuresis for volume excess. He had post op AFib requiring Amiodarone.EF improved after his AVR.   He was in the [employer] program and left at 346 lbs about 3 weeks ago. The records may reflect 326 lbs but pt says he was very hungry and eating more protein than the program allowed, so he began fudging on his weights. When he left, he was not on furosemide.   [name]. presents for weight gain and increasing edema. His weight has gone up steadily since discharge from the facility, 374.4 lbs. He has been eating out a lot, but does not add salt. No ETOH, has been drinking 6-8 pints of water per day.   He denies SOB, sleeps on 3-4 pillows chronically due to back problems, no recent change. No DOE, PND, no true orthopnea   Past Medical History  Diagnosis Date  . NICM (nonischemic cardiomyopathy)     echo 12/11: EF 45-50%, grade 1 diast dysfxn, moderately severe AS (AVA 0.9, mean 31 mmHg), mild MR;    cath 1/12:  normal cors, AVA 1.0, peak to peak gradiet for AV 31 mmHg, mod-severe AS, EF 35-40%  . 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 Hx MRSA infection     thigh abcess 11/06  . Anxiety   . Aortic stenosis     AVR 11/29/11  . HTN (hypertension)   . HLD (hyperlipidemia)   . Gout   . Chronic diastolic heart failure     Echo 09/2012: mod LVH, EF 55-65%, Gr 1 DD, AVR ok, mild LAE.  Marland Kitchen Heart murmur   . CHF (congestive heart failure)   . DM2 (diabetes mellitus, type 2)   . Pneumonia 08/2005  . OSA on CPAP   . Hepatitis   . Arthritis     "back" (02/18/2015)  . History of acute renal failure ?12/2013; 07/2014    History   Social History  . Marital Status: Divorced    Spouse Name: N/A  .  Number of Children: N/A  . Years of Education: N/A   Occupational History  . retired travelling Engineer, site    Social History Main Topics  . Smoking status: Never Smoker   . Smokeless tobacco: Never Used  . Alcohol Use: Yes     Comment: 02/18/2015 "once or twice/yr I'll have a cocktail"  . Drug Use: No  . Sexual Activity: Not Currently   Other Topics Concern  . None   Social History Narrative    ECHO:Study Conclusions--02/20/15  - Left ventricle: The cavity size was normal. Wall thickness was normal. The estimated ejection fraction was 55%. - Aortic valve: A bioprosthesis was present. Valve area (VTI): 1.09 cm^2. Valve area (Vmax): 1.22 cm^2. Valve area (Vmean): 1 cm^2. - Left atrium: The atrium was moderately dilated. - Right ventricle: Systolic function was mildly to moderately reduced. - Impressions: Limited study due to poor sound wave transmission secondary to patient&'s body habitus.  Impressions:  - Limited study due to poor sound wave transmission secondary to patient&'s body habitus.  ------------------------------------------------------------------- Labs, prior tests, procedures, and surgery: Valve surgery.   Aortic valve replacement.  Transthoracic echocardiography. M-mode, complete 2D, spectral Doppler,  and color Doppler. Birthdate: Patient birthdate: 02/07/1951. Age: Patient is 64 yr old. Sex: Gender: male. BMI: 50.3 kg/m^2. Blood pressure:   121/76 Patient status: Inpatient. Study date: Study date: 02/20/2015. Study time: 10:44 AM. Location: Bedside.  -------------------------------------------------------------------  BNP    Component Value Date/Time   BNP 67.3 02/18/2015 2116    ProBNP    Component Value Date/Time   PROBNP 42.0 03/25/2014 1258     Education Assessment and Provision:  Detailed education and instructions provided on heart failure disease management including the following:  Signs and  symptoms of Heart Failure When to call the physician Importance of daily weights Low sodium diet Fluid restriction Medication management Anticipated future follow-up appointments  Patient education given on each of the above topics.  Patient acknowledges understanding and acceptance of all instructions.  I spoke at length with patient regarding his HF.  He is very fixated on "losing weight" and has not associated weight gain to his HF in the past.  We discussed importance of daily weights and when to contact a physician.  He does not have a scale that will accommodate his current weight.  I gave him some online resources for ordering an appropriate scale.  He is knowledgeable about sodium and a low sodium diet from previous weight loss plans.  He lives alone in [place].  His daughters live in [place] and [place].  He sees Dr. Fortino Sic outpatient at [facility].    Education Materials:  "Living Better With Heart Failure" Booklet, Daily Weight Tracker Tool    High Risk Criteria for Readmission and/or Poor Patient Outcomes:   EF <30%- No 55%  2 or more admissions in 6 months- No  Difficult social situation- no-lives alone in [place]  Demonstrates medication noncompliance- No--denies    Barriers of Care:  Knowledge of HF and compliance   Discharge Planning:   Plans to discharge to home alone

## 2015-02-22 NOTE — Progress Notes (Signed)
Patient Name: Antonio [name] Date of Encounter: 02/22/2015     Principal Problem:   Acute on chronic diastolic CHF (congestive heart failure), NYHA class 3 Active Problems:   Diabetes mellitus, type II, insulin dependent   HYPERTENSION, BENIGN SYSTEMIC   Hyperlipidemia   Gout   Sleep apnea   S/P aortic valve replacement with bioprosthetic valve   Hypokalemia    SUBJECTIVE  UO was less yesterday.  Wt up 1 lb.  He still feels like he has excess volume but denies orthopnea/pnd and has been ambulating w/o dyspnea.  CURRENT MEDS . atorvastatin  40 mg Oral q1800  . buPROPion  300 mg Oral Daily  . colchicine  0.6 mg Oral Daily  . enoxaparin (LOVENOX) injection  40 mg Subcutaneous Q24H  . febuxostat  80 mg Oral Daily  . furosemide  80 mg Intravenous BID  . insulin aspart  0-15 Units Subcutaneous TID WC  . insulin glargine  60 Units Subcutaneous BID  . sodium chloride  3 mL Intravenous Q12H    OBJECTIVE  Filed Vitals:   02/21/15 0953 02/21/15 1520 02/21/15 2155 02/22/15 0606  BP: 119/53 110/68 117/73 132/76  Pulse: 91 89 87 88  Temp: 98.3 F (36.8 C) 98.6 F (37 C) 98.2 F (36.8 C) 98 F (36.7 C)  TempSrc: Oral Oral Oral Oral  Resp: 18 16 18 18   Height:      Weight:    355 lb 4.8 oz (161.163 kg)  SpO2: 95% 94% 92% 91%    Intake/Output Summary (Last 24 hours) at 02/22/15 0838 Last data filed at 02/22/15 0654  Gross per 24 hour  Intake   1880 ml  Output   2125 ml  Net   -245 ml   Filed Weights   02/21/15 0500 02/21/15 0652 02/22/15 0606  Weight: 354 lb (160.573 kg) 354 lb (160.573 kg) 355 lb 4.8 oz (161.163 kg)    PHYSICAL EXAM  General: Pleasant, NAD. Neuro: Alert and oriented X 3. Moves all extremities spontaneously. Psych: Normal affect. HEENT:  Normal  Neck: Supple without bruits.  JVP ~ 10-12 cm - difficult to gauge given girth. Lungs:  Resp regular and unlabored, CTA. Heart: RRR no s3, s4, or murmurs. Abdomen: Soft, non-tender, non-distended, BS +  x 4.  Extremities: No clubbing, cyanosis or edema. DP/PT/Radials 2+ and equal bilaterally.  Accessory Clinical Findings  Basic Metabolic Panel  Recent Labs  02/21/15 0511 02/22/15 0610  NA 135 137  K 3.5 3.4*  CL 91* 94*  CO2 30 31  GLUCOSE 75 80  BUN 26* 37*  CREATININE 1.13 1.33  CALCIUM 8.8 9.0   TELE  RSR, 1st deg avb, frequent PAC's.  Radiology/Studies  No results found.  ASSESSMENT AND PLAN  1.  Acute on chronic diastolic CHF:  EF 55% by echo this admission.  Relatively even net I/O yesterday, though he is minus 8.4 L for entire admission.  Wt 355 today - up a lb from yesterday but overall down 10 lbs since admission.  He says that he was 246 following discharge from diet program @ [facility] but has also had dietary noncompliance and increased caloric intake - thus he has likely gained some non-fluid weight as well.  BUN/Creat/bicarb up slightly and he appears euvolemic on exam.  I suspect that we have hit his new dry weight.  I will transition him to PO lasix today - 80 bid.  HR/BP stable.  F/U BMET in am with plan for d/c if  labs/symptoms stable.  2.  Essential HTN:  Stable with diuresis.  He is on lisinopril 20 bid, which has not been ordered here.  Will not add @ this time in setting of ongoing diuresis and rising BUN/creat.  3.  Hypokalemia:  Supp.  4.  DM II:  Glucose stable.  Cont lantus and SSI.  5.  Morbid Obesity:  S/p extensive diet counseling @ [facility].  He plans to follow-up with diet counseling here in [place] following d/c.  6.  S/P Bioprosthetic AVR:  Stable fxn on echo this admission.  7.  HL:  On statin.  Last LDL in Epic is 54 in 10/2011.  Nl lft's this admission.  Signed, Nicolasa Ducking NP  Patient seen, examined. Available data reviewed. Agree with findings, assessment, and plan as outlined by Ward Givens, NP. Exam reveals a very pleasant, obese male in no distress. Lung fields are clear. Heart is regular rate and rhythm. Peripheral edema has nearly  resolved. I agree with the plan as outlined above. The patient will be transitioned to oral Lasix today and I would anticipate discharge tomorrow if he remains stable. His volume status is improved. I agree that weight gain is likely a combination of volume excess and dietary indiscretion. He has been counseled extensively on the need for lifestyle modification.  Tonny Bollman, M.D. 02/22/2015 10:41 AM

## 2015-02-23 ENCOUNTER — Other Ambulatory Visit: Payer: Self-pay | Admitting: Physician Assistant

## 2015-02-23 DIAGNOSIS — I5043 Acute on chronic combined systolic (congestive) and diastolic (congestive) heart failure: Secondary | ICD-10-CM

## 2015-02-23 LAB — GLUCOSE, CAPILLARY
GLUCOSE-CAPILLARY: 67 mg/dL — AB (ref 70–99)
Glucose-Capillary: 96 mg/dL (ref 70–99)
Glucose-Capillary: 225 mg/dL — ABNORMAL HIGH (ref 70–99)

## 2015-02-23 LAB — BASIC METABOLIC PANEL
ANION GAP: 9 (ref 5–15)
BUN: 34 mg/dL — ABNORMAL HIGH (ref 6–23)
CO2: 31 mmol/L (ref 19–32)
CREATININE: 1.17 mg/dL (ref 0.50–1.35)
Calcium: 8.9 mg/dL (ref 8.4–10.5)
Chloride: 96 mmol/L (ref 96–112)
GFR calc Af Amer: 74 mL/min — ABNORMAL LOW (ref >90)
GFR calc non Af Amer: 64 mL/min — ABNORMAL LOW (ref >90)
GLUCOSE: 74 mg/dL (ref 70–99)
POTASSIUM: 3.5 mmol/L (ref 3.5–5.1)
Sodium: 136 mmol/L (ref 135–145)

## 2015-02-23 MED ORDER — POTASSIUM CHLORIDE CRYS ER 20 MEQ PO TBCR: 40.0000 meq | EXTENDED_RELEASE_TABLET | Freq: Every day | ORAL | Status: DC

## 2015-02-23 MED ORDER — LISINOPRIL 20 MG PO TABS: 20.0000 mg | ORAL_TABLET | Freq: Every day | ORAL | Status: DC

## 2015-02-23 NOTE — Progress Notes (Signed)
Hypoglycemic Event  CBG: 67  Treatment: 15 GM carbohydrate snack  Symptoms: None  Follow-up CBG: Time: 5:59 AM   CBG Result:96  Possible Reasons for Event: Unknown     Antonio [name] A  Remember to initiate Hypoglycemia Order Set & complete

## 2015-02-23 NOTE — Progress Notes (Signed)
RN Toma Copier will be reviewing pt's discharge instructions with pt, pt's ride is here to transport pt home.

## 2015-02-23 NOTE — Progress Notes (Signed)
Inpatient Diabetes Program Recommendations  AACE/ADA: New Consensus Statement on Inpatient Glycemic Control (2013)  Target Ranges:  Prepandial:   less than 140 mg/dL      Peak postprandial:   less than 180 mg/dL (1-2 hours)      Critically ill patients:  140 - 180 mg/dL   Inpatient Diabetes Program Recommendations Insulin - Basal: consider reducing Lantus to 55 units BID Insulin - Meal Coverage: add Novolog 5 units TID with meals for elevated postprandials Thank you  Piedad Climes BSN, RN,CDE Inpatient Diabetes Coordinator [phone] (team pager)

## 2015-02-23 NOTE — Progress Notes (Signed)
Chaplain facilitated completion of advanced directive. Copy placed in chart.    02/23/15 1000  Clinical Encounter Type  Visited With Patient  Visit Type Follow-up;Spiritual support  Referral From Nurse  Spiritual Encounters  Spiritual Needs Emotional  Stress Factors  Patient Stress Factors Health changes  Advance Directives (For Healthcare)  Does patient have an advance directive? Yes  Type of Estate agent of [name];Living will  Copy of advanced directive(s) in chart? Yes  Antonio [name], Mayer Masker, Chaplain 02/23/2015 10:03 AM

## 2015-02-23 NOTE — Progress Notes (Signed)
Unit NT ambulated with pt down hall and monitored pt's O2 sats on RA. SATS 95% starting out, after 372ft sats only decreased to 92%, recovered up to 94% after resting a moment. No SOB while walking, tolerated well.

## 2015-02-23 NOTE — Progress Notes (Signed)
I stopped back into check on [name] to reinforce education.  He tells me that he has reviewed the written material that I provided and that he does not have any questions at this time.  I encouraged him to call with any questions/ concerns regarding his HF.  He plans to follow-up with Dr. Clifton James with [facility].

## 2015-02-23 NOTE — Discharge Summary (Signed)
CARDIOLOGY DISCHARGE SUMMARY   Patient ID: Antonio [name] MRN: [id number] DOB/AGE: 03-02-1951 64 y.o.  Admit date: 02/18/2015 Discharge date: 02/23/2015  PCP: [name], MD Primary Cardiologist: Dr. Clifton James  Primary Discharge Diagnosis:   Acute on chronic diastolic CHF (congestive heart failure), NYHA class 3 - weight 353 pounds at discharge Secondary Discharge Diagnosis:    Diabetes mellitus, type II, insulin dependent   Hyperlipidemia   Gout   HYPERTENSION, BENIGN SYSTEMIC   Sleep apnea   S/P aortic valve replacement with bioprosthetic valve   Hypokalemia  Procedures: 2-D echocardiogram  Hospital Course: Antonio [name] is a 64 y.o. male with a history of severe AS, s/p bioprosthetic AVR in 10/2011, combined systolic and diastolic CHF, NICM, DM, HTN, HL, OSA. He did well after AVR but required several more weeks in the hospital for diuresis for volume excess. He had post op AFib requiring Amiodarone.EF improved after his AVR and with maintenance of sinus rhythm.  He was in the [employer] program and left at 346 lbs about 3 weeks ago. The records may reflect 326 lbs but pt says he was very hungry and eating more protein than the program allowed, so he began fudging on his weights. When he left, he was not on furosemide.   He was seen in the office for shortness breath. He had significant lower extremity edema, and was having problems with weeping due to the fluid. He was also more short of breath than usual. He was admitted for further evaluation and diuresis.  A 2-D echocardiogram was performed, results are below. His EF is 55%.  He was diuresed with IV Lasix and his weight decreased by 12 pounds during his hospital stay. He was seen by [facility] heart failure nurse and educated on salt restrictions, fluid restrictions and medication compliance. He admits that he was eating a poor diet, not adding salt, but eating salty foods and brining foods before cooking them. He is  also a diabetic and admits poor compliance with a diabetic diet as well.  His renal function was followed closely during his hospital stay and his potassium was supplemented as needed. He will be discharged on supplemental potassium. His BUN increased his creatinine remained stable with diuresis. His systolic blood pressure had been in the 110s and 120s during his hospital stay so he will resume his lisinopril at discharge but at a lower dose.  On 02/23/2015, was seen by Dr. Excell Seltzer and all data were reviewed. The importance of compliance with meds and dietary restrictions was emphasized. He'll need a TCM appointment with a BMET next week in the office. He was ambulating well and his shortness of breath as well as lower extremity edema had greatly improved. No further inpatient workup was indicated and he is considered stable for discharge, to follow-up as an outpatient.  Labs:   Lab Results  Component Value Date   WBC 9.5 02/18/2015   HGB 11.6* 02/18/2015   HCT 36.6* 02/18/2015   MCV 81.3 02/18/2015   PLT PLATELET CLUMPS NOTED ON SMEAR, UNABLE TO ESTIMATE 02/18/2015    Recent Labs Lab 02/18/15 2116  02/23/15 0453  NA 139  < > 136  K 4.4  < > 3.5  CL 101  < > 96  CO2 30  < > 31  BUN 29*  < > 34*  CREATININE 1.26  < > 1.17  CALCIUM 9.1  < > 8.9  PROT 6.9  --   --  BILITOT 0.3  --   --   ALKPHOS 134*  --   --   ALT 21  --   --   AST 24  --   --   GLUCOSE 305*  < > 74  < > = values in this interval not displayed.   B NATRIURETIC PEPTIDE  Date/Time Value Ref Range Status  02/18/2015 09:16 PM 67.3 0.0 - 100.0 pg/mL Final   Echo: 02/20/2015 Conclusions - Left ventricle: The cavity size was normal. Wall thickness was normal. The estimated ejection fraction was 55%. - Aortic valve: A bioprosthesis was present. Valve area (VTI): 1.09 cm^2. Valve area (Vmax): 1.22 cm^2. Valve area (Vmean): 1 cm^2. - Left atrium: The atrium was moderately dilated. - Right ventricle: Systolic  function was mildly to moderately reduced. - Impressions: Limited study due to poor sound wave transmission secondary to patient&'s body habitus. Impressions: - Limited study due to poor sound wave transmission secondary to patient&'s body habitus.  FOLLOW UP PLANS AND APPOINTMENTS Allergies  Allergen Reactions  . Other Other (See Comments)    Salt causes edema.     Medication List    TAKE these medications        atorvastatin 40 MG tablet  Commonly known as:  LIPITOR  Take 40 mg by mouth daily at 6 PM.     BAYER CONTOUR NEXT TEST test strip  Generic drug:  glucose blood  1 each by Other route as needed. for testing     buPROPion 300 MG 24 hr tablet  Commonly known as:  WELLBUTRIN XL  Take 300 mg by mouth daily.     colchicine 0.6 MG tablet  Take 0.6 mg by mouth daily.     febuxostat 40 MG tablet  Commonly known as:  ULORIC  Take 80 mg by mouth daily. Patient taking two 40 mg tablets by mouth daily.     furosemide 80 MG tablet  Commonly known as:  LASIX  Take 80 mg by mouth 2 (two) times daily.     HYDROcodone-acetaminophen 7.5-325 MG per tablet  Commonly known as:  NORCO  Take 1 tablet by mouth every 4 (four) hours as needed for moderate pain.     insulin glargine 100 UNIT/ML injection  Commonly known as:  LANTUS  Inject 60 Units into the skin 2 (two) times daily. As directed     lisinopril 20 MG tablet  Commonly known as:  PRINIVIL,ZESTRIL  Take 1 tablet (20 mg total) by mouth daily.     potassium chloride SA 20 MEQ tablet  Commonly known as:  K-DUR,KLOR-CON  Take 2 tablets (40 mEq total) by mouth daily.     terbinafine 1 % cream  Commonly known as:  LAMISIL  Apply 1 application topically daily as needed (for fungus).        Discharge Instructions    (HEART FAILURE PATIENTS) Call MD:  Anytime you have any of the following symptoms: 1) 3 pound weight gain in 24 hours or 5 pounds in 1 week 2) shortness of breath, with or without a dry hacking  cough 3) swelling in the hands, feet or stomach 4) if you have to sleep on extra pillows at night in order to breathe.    Complete by:  As directed      AMB Referral to [facility] Management    Complete by:  As directed   Reason for consult:  HF exacerbation - post hospital follow up  Diagnoses of:  Heart Failure  Expected date of contact:  1-3 days (reserved for hospital discharges)     Diet - low sodium heart healthy    Complete by:  As directed      Diet Carb Modified    Complete by:  As directed      Increase activity slowly    Complete by:  As directed           Follow-up Information    Follow up with Verne Carrow, MD.   Specialty:  Cardiology   Why:  The office will call.   Contact information:   [address] [place] [address] [phone]       BRING ALL MEDICATIONS WITH YOU TO FOLLOW UP APPOINTMENTS  Time spent with patient to include physician time: 48 min Signed: Theodore Demark, PA-C 02/23/2015, 3:12 PM Co-Sign MD

## 2015-02-23 NOTE — Progress Notes (Signed)
Patient Name: Antonio [name] Date of Encounter: 02/23/2015  Principal Problem:   Acute on chronic diastolic CHF (congestive heart failure), NYHA class 3 Active Problems:   Diabetes mellitus, type II, insulin dependent   Hyperlipidemia   Gout   HYPERTENSION, BENIGN SYSTEMIC   Sleep apnea   S/P aortic valve replacement with bioprosthetic valve   Hypokalemia    Primary Cardiologist: Dr. Clifton James  Patient Profile: 64 yo male w/ hx of severe AS s/p bioprosthetic AVR (10/2011), combined systolic and diastolic CHF, NICM, DM, HTN, HLD, OSA admitted from clinic 04/22 with weight gain and worsening edema.   SUBJECTIVE: Reports edema is improved; however, still feels swollen/bloated. Has not had a bowel movement since Sunday. Denies chest pain, palpitations, shortness of breath. Denies orthopnea, PND, dyspnea with ambulation. Wondering if he should stay another day and make sure he does OK on PO Lasix, otherwise doing well.  OBJECTIVE Filed Vitals:   02/22/15 1400 02/22/15 2054 02/23/15 0532 02/23/15 0534  BP: 119/58 127/75  123/73  Pulse: 88 86  100  Temp: 98.6 F (37 C) 97.5 F (36.4 C)  97.8 F (36.6 C)  TempSrc: Oral Oral  Oral  Resp: 18 20  20   Height:      Weight:   353 lb 4.8 oz (160.256 kg)   SpO2: 97% 95%  94%    Intake/Output Summary (Last 24 hours) at 02/23/15 0952 Last data filed at 02/23/15 0600  Gross per 24 hour  Intake   1162 ml  Output    900 ml  Net    262 ml   Filed Weights   02/21/15 0652 02/22/15 0606 02/23/15 0532  Weight: 354 lb (160.573 kg) 355 lb 4.8 oz (161.163 kg) 353 lb 4.8 oz (160.256 kg)    PHYSICAL EXAM General: Obese male in no acute distress. Head: Normocephalic, atraumatic.  Neck: Supple without bruits, JVD mildly elevated (difficult to assess given body habitus). Lungs:  Resp regular and unlabored, CTA. Heart: RRR, S1, S2, no S3, S4, or murmur; no rub. Abdomen: Soft, non-tender, non-distended, BS + x 4.  Extremities: No clubbing,  cyanosis, trace edema. Blood blisters on feet are chronic, occur frequently Neuro: Alert and oriented X 3. Moves all extremities spontaneously. Psych: Normal affect.  LABS: Basic Metabolic Panel: Recent Labs  02/22/15 0610 02/23/15 0453  NA 137 136  K 3.4* 3.5  CL 94* 96  CO2 31 31  GLUCOSE 80 74  BUN 37* 34*  CREATININE 1.33 1.17  CALCIUM 9.0 8.9   BNP:  B NATRIURETIC PEPTIDE  Date/Time Value Ref Range Status  02/18/2015 09:16 PM 67.3 0.0 - 100.0 pg/mL Final   Current Medications:  . atorvastatin  40 mg Oral q1800  . buPROPion  300 mg Oral Daily  . colchicine  0.6 mg Oral Daily  . enoxaparin (LOVENOX) injection  80 mg Subcutaneous Q24H  . febuxostat  80 mg Oral Daily  . furosemide  80 mg Oral BID  . insulin aspart  0-15 Units Subcutaneous TID WC  . insulin glargine  60 Units Subcutaneous BID  . potassium chloride  40 mEq Oral Daily  . sodium chloride  3 mL Intravenous Q12H      ASSESSMENT AND PLAN:  Principal Problem:   Acute on chronic diastolic CHF (congestive heart failure), NYHA class 3 - echo 04/24 EF 55%, LV diastolic dysfunction - weight 607 today (down 12 lbs since admission 04/23) - I/O 1762/900 last 24 hours, net negative 7.6 L  since admission - renal function stable (creatinine 1.17 today, 1.33 yesterday; BUN 34 today, 37 yesterday) - continue PO lasix 80 mg BID  - discussed importance of dietary compliance, limit fluid to 1.5 L daily - will need early f/u, consider CHF Clinic referral  Active Problems:   Diabetes mellitus, type II, insulin dependent - continue lantus and SSI    Hyperlipidemia - continue atorvastatin    Hypertension - SBP 119-127 over last 24 hours, stable - home lisinopril (20 mg BID) has been held during this admission in the setting of diuresis  - continue PO lasix 80 mg BID     S/P aortic valve replacement with bioprosthetic valve - echo 04/24 bioprosthesis present, stable function    Hypokalemia - potassium 3.5 today,  replete (40 mEq PO potassium daily ordered)   Plan - ambulate, ck sats w/ ambulation, possible d/c today if does well.   SignedTheodore Demark , PA-C 9:52 AM 02/23/2015  Patient seen, examined. Available data reviewed. Agree with findings, assessment, and plan as outlined by Theodore Demark, PA-C. The patient is independently interviewed and examined. Lung fields are clear. Heart is regular rate and rhythm without murmur or gallop. There is trace edema on the right leg and no edema on the left. Swelling is greatly improved from hospital admission. He has transitioned nicely back to oral furosemide. Recommend discharging him on furosemide 80 mg twice daily. We discussed the importance of daily weights. He is going to buy a scale today after discharge. I think he should be discharged with metolazone 2.5 mg as needed for weight gain greater than 5 pounds from his baseline. We discussed the importance of weight loss, sodium restriction, and close medical follow-up. He should have a metabolic panel and office visit next week. Otherwise plan as outlined above.  Tonny Bollman, M.D. 02/23/2015 11:53 AM

## 2015-02-25 ENCOUNTER — Ambulatory Visit (INDEPENDENT_AMBULATORY_CARE_PROVIDER_SITE_OTHER): Payer: Medicare Other | Admitting: Cardiovascular Disease

## 2015-02-25 ENCOUNTER — Other Ambulatory Visit: Payer: Self-pay | Admitting: *Deleted

## 2015-02-25 ENCOUNTER — Encounter: Payer: Self-pay | Admitting: Cardiovascular Disease

## 2015-02-25 VITALS — BP 106/54 | HR 93 | Ht 71.5 in | Wt 353.8 lb

## 2015-02-25 DIAGNOSIS — I350 Nonrheumatic aortic (valve) stenosis: Secondary | ICD-10-CM

## 2015-02-25 DIAGNOSIS — I5033 Acute on chronic diastolic (congestive) heart failure: Secondary | ICD-10-CM | POA: Diagnosis not present

## 2015-02-25 DIAGNOSIS — E785 Hyperlipidemia, unspecified: Secondary | ICD-10-CM

## 2015-02-25 DIAGNOSIS — I10 Essential (primary) hypertension: Secondary | ICD-10-CM | POA: Diagnosis not present

## 2015-02-25 MED ORDER — METOLAZONE 2.5 MG PO TABS
ORAL_TABLET | ORAL | Status: DC
Start: 2015-02-25 — End: 2015-05-17

## 2015-02-25 NOTE — Progress Notes (Signed)
Chief Complaint  Patient presents with  . Leg Swelling    History of Present Illness: 64 yo WM with a history of severe aortic stenosis s/p AVR with bioprosthetic aortic valve in January 2013, obstructive sleep apnea, DM, HTN, gout, hyperlipidemia, chronic combined systolic and diastolic heart failure and non-ischemic cardiomyopathy who is here today for cardiac follow up. He was known to have severe aortic valve stenosis in 2012 and developed massive volume overload in January 2013. He was diuresed and underwent Aortic valve replacement surgery per Dr. Donata Clay on 11/29/2011. (Aortic valve replacement with a 27-mm pericardial [facility] Ease valve (model [id number], serial number [id number]).) He did well following surgery but required several more weeks of hospitalization for diuresis following surgery. He did have a short run of atrial fibrillation and was started on amiodarone. His history includes left heart catheterization done December 2011 demonstrating normal coronary arteries. He has been seen in our office several times over the last year with volume overload and was admitted to [facility] 02/18/15 for IV diuresis. He lost 12 lbs. Echo on 02/20/15 with normal LV systolic function.   He is here today for follow up. He is feeling well. Breathing is normal. No chest pain. Weight stable.   Primary Care Physician: Loyal Jacobson  Last Lipid Profile:Lipid Panel     Component Value Date/Time   CHOL 112 11/27/2011 0520   TRIG 161* 11/27/2011 0520   HDL 26* 11/27/2011 0520   CHOLHDL 4.3 11/27/2011 0520   VLDL 32 11/27/2011 0520   LDLCALC 54 11/27/2011 0520    Past Medical History  Diagnosis Date  . NICM (nonischemic cardiomyopathy)     echo 12/11: EF 45-50%, grade 1 diast dysfxn, moderately severe AS (AVA 0.9, mean 31 mmHg), mild MR;    cath 1/12:  normal cors, AVA 1.0, peak to peak gradiet for AV 31 mmHg, mod-severe AS, EF 35-40%  . 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 Hx MRSA  infection     thigh abcess 11/06  . Anxiety   . Aortic stenosis     AVR 11/29/11  . HTN (hypertension)   . HLD (hyperlipidemia)   . Gout   . Chronic diastolic heart failure     Echo 09/2012: mod LVH, EF 55-65%, Gr 1 DD, AVR ok, mild LAE.  Marland Kitchen Heart murmur   . CHF (congestive heart failure)   . DM2 (diabetes mellitus, type 2)   . Pneumonia 08/2005  . OSA on CPAP   . Hepatitis   . Arthritis     "back" (02/18/2015)  . History of acute renal failure ?12/2013; 07/2014    Past Surgical History  Procedure Laterality Date  . Lumbar laminectomy  10/30/1983  . Aortic valve replacement  11/29/2011    Procedure: AORTIC VALVE REPLACEMENT (AVR);  Surgeon: [name], MD;  Location[facility] OR;  Service: Open Heart Surgery;  Laterality: N/A;  with nitric oxide  . Right heart catheterization N/A 11/28/2011    Procedure: RIGHT HEART CATH;  Surgeon: [name], MD;  Location: [facility] CATH LAB;  Service: Cardiovascular;  Laterality: N/A;  . Cardiac valve replacement    . Incision and drainage abscess Left 08/2005    "upper/inner thigh; that's where the MRSA was"  . Back surgery      Current Outpatient Prescriptions  Medication Sig Dispense Refill  . atorvastatin (LIPITOR) 40 MG tablet Take 40 mg by mouth daily at 6 PM.     .  BAYER CONTOUR NEXT TEST test strip 1 each by Other route as needed. for testing  1  . buPROPion (WELLBUTRIN XL) 300 MG 24 hr tablet Take 300 mg by mouth daily.   0  . colchicine 0.6 MG tablet Take 0.6 mg by mouth daily.     . febuxostat (ULORIC) 40 MG tablet Take 80 mg by mouth daily. Patient taking two 40 mg tablets by mouth daily.    . furosemide (LASIX) 80 MG tablet Take 80 mg by mouth 2 (two) times daily.    Marland Kitchen HYDROcodone-acetaminophen (NORCO) 7.5-325 MG per tablet Take 1 tablet by mouth every 4 (four) hours as needed for moderate pain.     Marland Kitchen insulin glargine (LANTUS) 100 UNIT/ML injection Inject 60 Units into the skin 2 (two) times daily. As directed    .  lisinopril (PRINIVIL,ZESTRIL) 20 MG tablet Take 1 tablet (20 mg total) by mouth daily. 30 tablet 11  . potassium chloride SA (K-DUR,KLOR-CON) 20 MEQ tablet Take 2 tablets (40 mEq total) by mouth daily. 60 tablet 11  . terbinafine (LAMISIL) 1 % cream Apply 1 application topically daily as needed (for fungus).     . metolazone (ZAROXOLYN) 2.5 MG tablet Take one tablet by mouth daily as needed for wt gain greater than 5 lbs. 15 tablet 4   No current facility-administered medications for this visit.    Allergies  Allergen Reactions  . Other Other (See Comments)    Salt causes edema.    History   Social History  . Marital Status: Divorced    Spouse Name: N/A  . Number of Children: N/A  . Years of Education: N/A   Occupational History  . retired travelling Engineer, site    Social History Main Topics  . Smoking status: Never Smoker   . Smokeless tobacco: Never Used  . Alcohol Use: Yes     Comment: 02/18/2015 "once or twice/yr I'll have a cocktail"  . Drug Use: No  . Sexual Activity: Not Currently   Other Topics Concern  . Not on file   Social History Narrative    Family History  Problem Relation Age of Onset  . Heart attack Father   . Cancer Mother     Review of Systems:  As stated in the HPI and otherwise negative.   BP 106/54 mmHg  Pulse 93  Ht 5' 11.5" (1.816 m)  Wt 353 lb 12.8 oz (160.483 kg)  BMI 48.66 kg/m2  Physical Examination: General: Well developed, well nourished, NAD HEENT: OP clear, mucus membranes moist SKIN: warm, dry. No rashes. Neuro: No focal deficits Musculoskeletal: Muscle strength 5/5 all ext Psychiatric: Mood and affect normal Neck: No JVD, no carotid bruits, no thyromegaly, no lymphadenopathy. Lungs:Clear bilaterally, no wheezes, rhonci, crackles Cardiovascular: Regular rate and rhythm. No murmurs, gallops or rubs. Abdomen:Soft. Bowel sounds present. Non-tender.  Extremities: 1+ bilateral lower extremity edema. Pulses are 2 + in the  bilateral DP/PT.  Echo 02/20/15: Left ventricle: The cavity size was normal. Wall thickness was normal. The estimated ejection fraction was 55%. - Aortic valve: A bioprosthesis was present. Valve area (VTI): 1.09 cm^2. Valve area (Vmax): 1.22 cm^2. Valve area (Vmean): 1 cm^2. - Left atrium: The atrium was moderately dilated. - Right ventricle: Systolic function was mildly to moderately reduced. - Impressions: Limited study due to poor sound wave transmission secondary to patient&'s body habitus. Impressions: - Limited study due to poor sound wave transmission secondary to patient&'s body habitus.  EKG:  EKG is not ordered  today. The ekg ordered today demonstrates  Recent Labs: 03/25/2014: Pro B Natriuretic peptide (BNP) 42.0 02/18/2015: ALT 21; B Natriuretic Peptide 67.3; Hemoglobin 11.6*; Magnesium 2.0; Platelets PLATELET CLUMPS NOTED ON SMEAR, UNABLE TO ESTIMATE; TSH 4.928* 02/23/2015: BUN 34*; Creatinine 1.17; Potassium 3.5; Sodium 136   Lipid Panel    Component Value Date/Time   CHOL 112 11/27/2011 0520   TRIG 161* 11/27/2011 0520   HDL 26* 11/27/2011 0520   CHOLHDL 4.3 11/27/2011 0520   VLDL 32 11/27/2011 0520   LDLCALC 54 11/27/2011 0520   LDLDIRECT 106* 09/03/2007 1943     Wt Readings from Last 3 Encounters:  02/25/15 353 lb 12.8 oz (160.483 kg)  02/23/15 353 lb 4.8 oz (160.256 kg)  02/18/15 374 lb 6.4 oz (169.827 kg)     Other studies Reviewed: Additional studies/ records that were reviewed today include: . Review of the above records demonstrates:    Assessment and Plan:   1. Acute on Chronic Diastolic CHF: Weight is stable post discharge. On Lasix 80 mg po BID. Follow daily weights. Use extra lasix as needed for weight gain.   2. Aortic stenosis, status post AVR: Well-functioning valve by echo 02/20/15  3. HTN: BP controlled. Continue current therapy  4. Hyperlipidemia: Continue statin.  Current medicines are reviewed at length with the patient  today.  The patient does not have concerns regarding medicines.  The following changes have been made:  Add metolazone 2.5 mg po prn for weight gain over 5 lbs  Labs/ tests ordered today include:  No orders of the defined types were placed in this encounter.    Disposition:   FU with me in 3  months  Signed, Verne Carrow, MD 02/26/2015 9:51 AM    [facility] [address] [place], [place], [place]  [address] Phone: [phone]; Fax: [phone]

## 2015-02-25 NOTE — Patient Instructions (Addendum)
Medication Instructions:   Your physician has recommended you make the following change in your medication: Start metolazone 2.5 mg by mouth daily as needed for weight gain greater than 5 lbs. Let us know if you have to use this more than 3 days per week.     Labwork: none  Testing/Procedures: none  Follow-Up: Your physician recommends that you schedule a follow-up appointment in:  3 months. Scheduled for May 30, 2015 at 3:45

## 2015-03-01 ENCOUNTER — Other Ambulatory Visit: Payer: Self-pay | Admitting: *Deleted

## 2015-03-04 ENCOUNTER — Encounter: Payer: Self-pay | Admitting: *Deleted

## 2015-03-04 ENCOUNTER — Other Ambulatory Visit: Payer: Self-pay | Admitting: *Deleted

## 2015-03-18 ENCOUNTER — Encounter: Payer: Self-pay | Admitting: *Deleted

## 2015-04-07 ENCOUNTER — Telehealth: Payer: Self-pay | Admitting: Cardiovascular Disease

## 2015-04-07 NOTE — Telephone Encounter (Signed)
New Message    Pt is requesting that this message is to be sent to the nurse  Pt states that he need medication called in before he goes on vacation  Pt wants hydrocodone acetaninoph  7.5 mg   1x 4 to 5 hours as needed   for his back is going out and he is going to the beach and will need medication so he can jump in and out of the pool  The pharmacy that he wants to use is [facility] in [place] on [address]

## 2015-04-07 NOTE — Telephone Encounter (Signed)
Spoke with pt. He is requesting medication listed below for back pain. I told pt Dr. Clifton James does not order pain medication.  Pt verbalizes understanding.

## 2015-05-17 ENCOUNTER — Other Ambulatory Visit: Payer: Self-pay

## 2015-05-17 DIAGNOSIS — I5033 Acute on chronic diastolic (congestive) heart failure: Secondary | ICD-10-CM

## 2015-05-17 MED ORDER — METOLAZONE 2.5 MG PO TABS: ORAL_TABLET | ORAL | Status: DC

## 2015-05-30 ENCOUNTER — Encounter: Payer: Self-pay | Admitting: Cardiovascular Disease

## 2015-05-30 ENCOUNTER — Ambulatory Visit (INDEPENDENT_AMBULATORY_CARE_PROVIDER_SITE_OTHER): Payer: Medicare Other | Admitting: Cardiovascular Disease

## 2015-05-30 VITALS — BP 130/66 | HR 88 | Ht 71.0 in | Wt 384.0 lb

## 2015-05-30 DIAGNOSIS — E785 Hyperlipidemia, unspecified: Secondary | ICD-10-CM

## 2015-05-30 DIAGNOSIS — I10 Essential (primary) hypertension: Secondary | ICD-10-CM

## 2015-05-30 DIAGNOSIS — Z952 Presence of prosthetic heart valve: Secondary | ICD-10-CM

## 2015-05-30 DIAGNOSIS — Z954 Presence of other heart-valve replacement: Secondary | ICD-10-CM | POA: Diagnosis not present

## 2015-05-30 DIAGNOSIS — I5033 Acute on chronic diastolic (congestive) heart failure: Secondary | ICD-10-CM | POA: Diagnosis not present

## 2015-05-30 MED ORDER — FUROSEMIDE 40 MG PO TABS: 120.0000 mg | ORAL_TABLET | Freq: Two times a day (BID) | ORAL | Status: DC

## 2015-05-30 NOTE — Patient Instructions (Signed)
Medication Instructions:  Your physician has recommended you make the following change in your medication:  Increase furosemide to 120 mg by mouth twice daily   Labwork: Your physician recommends that you return for lab work on August 4,2016.  BMP   Testing/Procedures:  You have been referred to the heart failure clinic.  Please schedule patient for new patient appt.    Follow-Up: To be determined after seen in heart failure clinic.  Call back today with results of recent lab work.

## 2015-05-30 NOTE — Progress Notes (Signed)
Chief Complaint  Patient presents with  . Leg Swelling    History of Present Illness: 64 yo WM with a history of severe aortic stenosis s/p AVR with bioprosthetic aortic valve in January 2013, obstructive sleep apnea, DM, HTN, gout, hyperlipidemia, chronic combined systolic and diastolic heart failure and non-ischemic cardiomyopathy who is here today for cardiac follow up. He was known to have severe aortic valve stenosis in 2012 and developed massive volume overload in January 2013. He was diuresed and underwent Aortic valve replacement surgery per Dr. Donata Clay on 11/29/2011. (Aortic valve replacement with a 27-mm pericardial [facility] Ease valve (model [id number], serial number [id number]).) He did well following surgery but required several more weeks of hospitalization for diuresis following surgery. He did have a short run of atrial fibrillation and was started on amiodarone. His history includes left heart catheterization done December 2011 demonstrating normal coronary arteries. He has been seen in our office several times over the last year with volume overload and was admitted to [facility] 02/18/15 for IV diuresis. He lost 12 lbs. Echo on 02/20/15 with normal LV systolic function.   He is here today for follow up. He is feeling well. Breathing is normal. No chest pain. Weight is up 31 lbs in last 90 days. He has been taking Lasix 80 mg twice daily. LE edema is worsened. He has no SOB beyond his baseline. Occasional orthopnea.   Primary Care Physician: Loyal Jacobson  Last Lipid Profile:Lipid Panel     Component Value Date/Time   CHOL 112 11/27/2011 0520   TRIG 161* 11/27/2011 0520   HDL 26* 11/27/2011 0520   CHOLHDL 4.3 11/27/2011 0520   VLDL 32 11/27/2011 0520   LDLCALC 54 11/27/2011 0520    Past Medical History  Diagnosis Date  . NICM (nonischemic cardiomyopathy)     echo 12/11: EF 45-50%, grade 1 diast dysfxn, moderately severe AS (AVA 0.9, mean 31 mmHg), mild MR;    cath 1/12:   normal cors, AVA 1.0, peak to peak gradiet for AV 31 mmHg, mod-severe AS, EF 35-40%  . 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 Hx MRSA infection     thigh abcess 11/06  . Anxiety   . Aortic stenosis     AVR 11/29/11  . HTN (hypertension)   . HLD (hyperlipidemia)   . Gout   . Chronic diastolic heart failure     Echo 09/2012: mod LVH, EF 55-65%, Gr 1 DD, AVR ok, mild LAE.  Marland Kitchen Heart murmur   . CHF (congestive heart failure)   . DM2 (diabetes mellitus, type 2)   . Pneumonia 08/2005  . OSA on CPAP   . Hepatitis   . Arthritis     "back" (02/18/2015)  . History of acute renal failure ?12/2013; 07/2014    Past Surgical History  Procedure Laterality Date  . Lumbar laminectomy  10/30/1983  . Aortic valve replacement  11/29/2011    Procedure: AORTIC VALVE REPLACEMENT (AVR);  Surgeon: [name], MD;  Location: [facility] OR;  Service: Open Heart Surgery;  Laterality: N/A;  with nitric oxide  . Right heart catheterization N/A 11/28/2011    Procedure: RIGHT HEART CATH;  Surgeon: [name], MD;  Location: [facility] CATH LAB;  Service: Cardiovascular;  Laterality: N/A;  . Cardiac valve replacement    . Incision and drainage abscess Left 08/2005    "upper/inner thigh; that's where the MRSA was"  . Back surgery  Current Outpatient Prescriptions  Medication Sig Dispense Refill  . atorvastatin (LIPITOR) 40 MG tablet Take 40 mg by mouth daily at 6 PM.     . BAYER CONTOUR NEXT TEST test strip 1 each by Other route as needed. for testing  1  . buPROPion (WELLBUTRIN XL) 300 MG 24 hr tablet Take 300 mg by mouth daily.   0  . colchicine 0.6 MG tablet Take 0.6 mg by mouth daily.     . febuxostat (ULORIC) 40 MG tablet Take 80 mg by mouth daily. Patient taking two 40 mg tablets by mouth daily.    . fenofibrate 54 MG tablet Take 54 mg by mouth daily.  0  . HYDROcodone-acetaminophen (NORCO) 7.5-325 MG per tablet Take 1 tablet by mouth every 4 (four) hours as needed for moderate pain.       Marland Kitchen insulin glargine (LANTUS) 100 UNIT/ML injection Inject 60 Units into the skin 2 (two) times daily. As directed    . lisinopril (PRINIVIL,ZESTRIL) 20 MG tablet Take 1 tablet (20 mg total) by mouth daily. 30 tablet 11  . metolazone (ZAROXOLYN) 2.5 MG tablet Take one tablet by mouth daily as needed for wt gain greater than 5 lbs. 15 tablet 4  . NOVOLOG 100 UNIT/ML injection Inject into the skin 3 (three) times daily with meals. Sliding scale  0  . terbinafine (LAMISIL) 1 % cream Apply 1 application topically daily as needed (for fungus).     . furosemide (LASIX) 40 MG tablet Take 3 tablets (120 mg total) by mouth 2 (two) times daily. 180 tablet 3   No current facility-administered medications for this visit.    Allergies  Allergen Reactions  . Other Other (See Comments)    Salt causes edema.    History   Social History  . Marital Status: Divorced    Spouse Name: N/A  . Number of Children: N/A  . Years of Education: N/A   Occupational History  . retired travelling Engineer, site    Social History Main Topics  . Smoking status: Never Smoker   . Smokeless tobacco: Never Used  . Alcohol Use: Yes     Comment: 02/18/2015 "once or twice/yr I'll have a cocktail"  . Drug Use: No  . Sexual Activity: Not Currently   Other Topics Concern  . Not on file   Social History Narrative    Family History  Problem Relation Age of Onset  . Heart attack Father   . Cancer Mother     Review of Systems:  As stated in the HPI and otherwise negative.   BP 130/66 mmHg  Pulse 88  Ht  (1.803 m)  Wt 384 lb (174.181 kg)  BMI 53.58 kg/m2  SpO2 93%  Physical Examination: General: Well developed, well nourished, NAD HEENT: OP clear, mucus membranes moist SKIN: warm, dry. No rashes. Neuro: No focal deficits Musculoskeletal: Muscle strength 5/5 all ext Psychiatric: Mood and affect normal Neck: No JVD, no carotid bruits, no thyromegaly, no lymphadenopathy. Lungs:Clear bilaterally, no  wheezes, rhonci, crackles Cardiovascular: Regular rate and rhythm. No murmurs, gallops or rubs. Abdomen:Soft. Bowel sounds present. Non-tender.  Extremities: 1-2+ bilateral lower extremity edema. Pulses are 2 + in the bilateral DP/PT.  Echo 02/20/15: Left ventricle: The cavity size was normal. Wall thickness was normal. The estimated ejection fraction was 55%. - Aortic valve: A bioprosthesis was present. Valve area (VTI): 1.09 cm^2. Valve area (Vmax): 1.22 cm^2. Valve area (Vmean): 1 cm^2. - Left atrium: The atrium was moderately  dilated. - Right ventricle: Systolic function was mildly to moderately reduced. - Impressions: Limited study due to poor sound wave transmission secondary to patient&'s body habitus. Impressions: - Limited study due to poor sound wave transmission secondary to patient&'s body habitus.  EKG:  EKG is not ordered today. The ekg ordered today demonstrates  Recent Labs: 02/18/2015: ALT 21; B Natriuretic Peptide 67.3; Hemoglobin 11.6*; Magnesium 2.0; Platelets PLATELET CLUMPS NOTED ON SMEAR, UNABLE TO ESTIMATE; TSH 4.928* 02/23/2015: BUN 34*; Creatinine, Ser 1.17; Potassium 3.5; Sodium 136   Lipid Panel    Component Value Date/Time   CHOL 112 11/27/2011 0520   TRIG 161* 11/27/2011 0520   HDL 26* 11/27/2011 0520   CHOLHDL 4.3 11/27/2011 0520   VLDL 32 11/27/2011 0520   LDLCALC 54 11/27/2011 0520   LDLDIRECT 106* 09/03/2007 1943     Wt Readings from Last 3 Encounters:  05/30/15 384 lb (174.181 kg)  02/25/15 353 lb 12.8 oz (160.483 kg)  02/23/15 353 lb 4.8 oz (160.256 kg)     Other studies Reviewed: Additional studies/ records that were reviewed today include: . Review of the above records demonstrates:    Assessment and Plan:   1. Acute on Chronic Diastolic CHF: Weight is up 31 lbs over 90 days. He is followed by Nephrology at [facility] and they told him to stop Metolazone. He reports creatinine of 1.1 last week. Will increase Lasix to 120  mg po BID. Last hospitalization April 2016 for diuresis. Will refer to CHF clinic. Repeat BMET in 3 days. Follow daily weights.   2. Aortic stenosis, status post AVR: Well-functioning valve by echo 02/20/15  3. HTN: BP controlled. Continue current therapy  4. Hyperlipidemia: Continue statin.  Current medicines are reviewed at length with the patient today.  The patient does not have concerns regarding medicines.  The following changes have been made:  Add metolazone 2.5 mg po prn for weight gain over 5 lbs  Labs/ tests ordered today include:   Orders Placed This Encounter  Procedures  . Basic Metabolic Panel (BMET)    Disposition:   FU with me in 3  months  Signed, Verne Carrow, MD 05/30/2015 4:21 PM    [facility] [address] [place], [place], [place]  [address] Phone: [phone]; Fax: [phone]

## 2015-06-02 ENCOUNTER — Other Ambulatory Visit: Payer: Medicare Other

## 2015-06-03 ENCOUNTER — Other Ambulatory Visit (INDEPENDENT_AMBULATORY_CARE_PROVIDER_SITE_OTHER): Payer: Medicare Other | Admitting: *Deleted

## 2015-06-03 DIAGNOSIS — I5033 Acute on chronic diastolic (congestive) heart failure: Secondary | ICD-10-CM

## 2015-06-03 LAB — BASIC METABOLIC PANEL
BUN: 55 mg/dL — ABNORMAL HIGH (ref 6–23)
CALCIUM: 9.1 mg/dL (ref 8.4–10.5)
CO2: 32 meq/L (ref 19–32)
Chloride: 95 mEq/L — ABNORMAL LOW (ref 96–112)
Creatinine, Ser: 2.08 mg/dL — ABNORMAL HIGH (ref 0.40–1.50)
GFR: 34.27 mL/min — ABNORMAL LOW (ref >60.00)
Glucose, Bld: 332 mg/dL — ABNORMAL HIGH (ref 70–99)
Potassium: 4.2 mEq/L (ref 3.5–5.1)
Sodium: 136 mEq/L (ref 135–145)

## 2015-06-06 ENCOUNTER — Other Ambulatory Visit: Payer: Self-pay

## 2015-06-06 DIAGNOSIS — I10 Essential (primary) hypertension: Secondary | ICD-10-CM

## 2015-06-06 DIAGNOSIS — I5033 Acute on chronic diastolic (congestive) heart failure: Secondary | ICD-10-CM

## 2015-06-06 MED ORDER — FUROSEMIDE 40 MG PO TABS: 80.0000 mg | ORAL_TABLET | Freq: Two times a day (BID) | ORAL | Status: DC

## 2015-06-09 ENCOUNTER — Other Ambulatory Visit: Payer: Medicare Other

## 2015-06-10 ENCOUNTER — Other Ambulatory Visit: Payer: Medicare Other

## 2015-06-13 ENCOUNTER — Other Ambulatory Visit: Payer: Medicare Other

## 2015-06-13 ENCOUNTER — Telehealth: Payer: Self-pay | Admitting: Cardiovascular Disease

## 2015-06-13 NOTE — Telephone Encounter (Signed)
I spoke with the patient.  He last saw Dr. Clifton James on 05/30/15 and his weight was 384 lbs (per Dr. Gibson Ramp note, this is up 30 lbs in 90 days) He was on lasix 80 mg BID and at that visit Dr. Clifton James advised the patient to increase this to 120 mg BID. Metolazone had recently been stopped by nephrology. PRN orders were given on 05/30/15 to take metolazone 2.5 mg PRN for weight gain >5 lbs. A BMET was repeated on 06/03/15 and the patient's BUN/ creatinine were up to 55/2.08 ( up from 34/1.17- 02/23/15) At that time Dr. Clifton James decreased lasix back to 80 mg BID. The patient reports today that he is still having swelling to his lower extremities and hands. He states "I fell squishy like a water balloon." He is propping up on 4-5 pillows at night to sleep comfortably, but will find himself sliding off of these.  He has taken no metolazone. I advised the patient that he should report to the ER for evaluation and treatment. He states that he cannot go tonight and doesn't feel like he is in distress. I explained I would contact Dr. Clifton James and make him aware and see if he has other recommendations. He is agreeable. Sherri Rad, RN, BSN   I spoke with Dr. Clifton James and made him aware of the patient's complaints. He advises that the patient report to the ER. I advised him the patient states he cannot come today. He recommends the patient come tonight or in the morning. I have advised the patient of this and he reports he will come to the ER in the AM. Sherri Rad, RN, BSN

## 2015-06-13 NOTE — Telephone Encounter (Signed)
Pt c/o swelling: STAT is pt has developed SOB within 24 hours  1. How long have you been experiencing swelling? 4 weeks  2. Where is the swelling located? Legs, ankles, feet  3.  Are you currently taking a "fluid pill"? Yes, but it is low dose  4.  Are you currently SOB? Somewhat, coming and going when up and down  5.  Have you traveled recently? No

## 2015-06-14 ENCOUNTER — Emergency Department (HOSPITAL_COMMUNITY): Payer: Medicare Other

## 2015-06-14 ENCOUNTER — Inpatient Hospital Stay (HOSPITAL_COMMUNITY)
Admission: EM | Admit: 2015-06-14 | Discharge: 2015-06-21 | DRG: 292 | Disposition: A | Discharge status: Home / Self Care | Payer: Medicare Other | Attending: Cardiovascular Disease | Admitting: Cardiovascular Disease

## 2015-06-14 ENCOUNTER — Telehealth: Payer: Self-pay | Admitting: Cardiovascular Disease

## 2015-06-14 ENCOUNTER — Encounter (HOSPITAL_COMMUNITY): Payer: Self-pay | Admitting: *Deleted

## 2015-06-14 ENCOUNTER — Other Ambulatory Visit: Payer: Medicare Other

## 2015-06-14 DIAGNOSIS — Z794 Long term (current) use of insulin: Secondary | ICD-10-CM

## 2015-06-14 DIAGNOSIS — E785 Hyperlipidemia, unspecified: Secondary | ICD-10-CM | POA: Diagnosis present

## 2015-06-14 DIAGNOSIS — I429 Cardiomyopathy, unspecified: Secondary | ICD-10-CM | POA: Diagnosis present

## 2015-06-14 DIAGNOSIS — L97519 Non-pressure chronic ulcer of other part of right foot with unspecified severity: Secondary | ICD-10-CM | POA: Diagnosis present

## 2015-06-14 DIAGNOSIS — Z8614 Personal history of Methicillin resistant Staphylococcus aureus infection: Secondary | ICD-10-CM

## 2015-06-14 DIAGNOSIS — E876 Hypokalemia: Secondary | ICD-10-CM | POA: Diagnosis present

## 2015-06-14 DIAGNOSIS — E1165 Type 2 diabetes mellitus with hyperglycemia: Secondary | ICD-10-CM | POA: Diagnosis present

## 2015-06-14 DIAGNOSIS — G4733 Obstructive sleep apnea (adult) (pediatric): Secondary | ICD-10-CM | POA: Diagnosis present

## 2015-06-14 DIAGNOSIS — I10 Essential (primary) hypertension: Secondary | ICD-10-CM | POA: Diagnosis not present

## 2015-06-14 DIAGNOSIS — Z953 Presence of xenogenic heart valve: Secondary | ICD-10-CM | POA: Diagnosis not present

## 2015-06-14 DIAGNOSIS — I5033 Acute on chronic diastolic (congestive) heart failure: Principal | ICD-10-CM | POA: Diagnosis present

## 2015-06-14 DIAGNOSIS — Z6841 Body Mass Index (BMI) 40.0 and over, adult: Secondary | ICD-10-CM | POA: Diagnosis not present

## 2015-06-14 DIAGNOSIS — H5441 Blindness, right eye, normal vision left eye: Secondary | ICD-10-CM | POA: Diagnosis present

## 2015-06-14 DIAGNOSIS — E11621 Type 2 diabetes mellitus with foot ulcer: Secondary | ICD-10-CM | POA: Diagnosis present

## 2015-06-14 DIAGNOSIS — M109 Gout, unspecified: Secondary | ICD-10-CM | POA: Diagnosis present

## 2015-06-14 DIAGNOSIS — E8770 Fluid overload, unspecified: Secondary | ICD-10-CM

## 2015-06-14 DIAGNOSIS — I350 Nonrheumatic aortic (valve) stenosis: Secondary | ICD-10-CM | POA: Diagnosis not present

## 2015-06-14 DIAGNOSIS — R739 Hyperglycemia, unspecified: Secondary | ICD-10-CM

## 2015-06-14 LAB — BASIC METABOLIC PANEL
Anion gap: 11 (ref 5–15)
BUN: 32 mg/dL — AB (ref 6–20)
CALCIUM: 9 mg/dL (ref 8.9–10.3)
CHLORIDE: 98 mmol/L — AB (ref 101–111)
CO2: 27 mmol/L (ref 22–32)
CREATININE: 1.4 mg/dL — AB (ref 0.61–1.24)
GFR calc Af Amer: 60 mL/min — ABNORMAL LOW (ref >60)
GFR, EST NON AFRICAN AMERICAN: 52 mL/min — AB (ref >60)
Glucose, Bld: 300 mg/dL — ABNORMAL HIGH (ref 65–99)
Potassium: 4 mmol/L (ref 3.5–5.1)
SODIUM: 136 mmol/L (ref 135–145)

## 2015-06-14 LAB — BRAIN NATRIURETIC PEPTIDE: B Natriuretic Peptide: 49.4 pg/mL (ref 0.0–100.0)

## 2015-06-14 LAB — HEPATIC FUNCTION PANEL
ALBUMIN: 3.2 g/dL — AB (ref 3.5–5.0)
ALK PHOS: 116 U/L (ref 38–126)
ALT: 23 U/L (ref 17–63)
AST: 26 U/L (ref 15–41)
BILIRUBIN TOTAL: 0.4 mg/dL (ref 0.3–1.2)
Bilirubin, Direct: <0.1 mg/dL — ABNORMAL LOW (ref 0.1–0.5)
TOTAL PROTEIN: 7.2 g/dL (ref 6.5–8.1)

## 2015-06-14 LAB — CBC
HEMATOCRIT: 38 % — AB (ref 39.0–52.0)
HEMOGLOBIN: 12 g/dL — AB (ref 13.0–17.0)
MCH: 25.3 pg — ABNORMAL LOW (ref 26.0–34.0)
MCHC: 31.6 g/dL (ref 30.0–36.0)
MCV: 80 fL (ref 78.0–100.0)
Platelets: 292 10*3/uL (ref 150–400)
RBC: 4.75 MIL/uL (ref 4.22–5.81)
RDW: 13.8 % (ref 11.5–15.5)
WBC: 12.2 10*3/uL — AB (ref 4.0–10.5)

## 2015-06-14 MED ORDER — SODIUM CHLORIDE 0.9 % IV SOLN: 250.0000 mL | INTRAVENOUS | Status: DC | PRN

## 2015-06-14 MED ORDER — FEBUXOSTAT 40 MG PO TABS
40.0000 mg | ORAL_TABLET | Freq: Every day | ORAL | Status: DC
  Administered 2015-06-15 – 2015-06-21 (×7): 40 mg via ORAL
  Filled 2015-06-14 (×11): qty 1

## 2015-06-14 MED ORDER — ENOXAPARIN SODIUM 40 MG/0.4ML ~~LOC~~ SOLN
40.0000 mg | SUBCUTANEOUS | Status: DC
  Administered 2015-06-15 – 2015-06-17 (×3): 40 mg via SUBCUTANEOUS
  Filled 2015-06-14 (×3): qty 0.4

## 2015-06-14 MED ORDER — FUROSEMIDE 10 MG/ML IJ SOLN
80.0000 mg | Freq: Once | INTRAMUSCULAR | Status: AC
  Administered 2015-06-14: 80 mg via INTRAVENOUS
  Filled 2015-06-14: qty 8

## 2015-06-14 MED ORDER — ACETAMINOPHEN 325 MG PO TABS: 650.0000 mg | ORAL_TABLET | ORAL | Status: DC | PRN

## 2015-06-14 MED ORDER — SILVER SULFADIAZINE 1 % EX CREA
1.0000 "application " | TOPICAL_CREAM | Freq: Two times a day (BID) | CUTANEOUS | Status: DC
  Administered 2015-06-15 – 2015-06-21 (×12): 1 via TOPICAL
  Filled 2015-06-14 (×2): qty 85

## 2015-06-14 MED ORDER — FENOFIBRATE 54 MG PO TABS
54.0000 mg | ORAL_TABLET | Freq: Every day | ORAL | Status: DC
  Administered 2015-06-15 – 2015-06-21 (×7): 54 mg via ORAL
  Filled 2015-06-14 (×7): qty 1

## 2015-06-14 MED ORDER — SODIUM CHLORIDE 0.9 % IJ SOLN
3.0000 mL | Freq: Two times a day (BID) | INTRAMUSCULAR | Status: DC
  Administered 2015-06-14 – 2015-06-21 (×12): 3 mL via INTRAVENOUS

## 2015-06-14 MED ORDER — FUROSEMIDE 10 MG/ML IJ SOLN
100.0000 mg | Freq: Two times a day (BID) | INTRAVENOUS | Status: DC
  Administered 2015-06-15 – 2015-06-16 (×3): 100 mg via INTRAVENOUS
  Filled 2015-06-14 (×4): qty 10

## 2015-06-14 MED ORDER — ATORVASTATIN CALCIUM 40 MG PO TABS
40.0000 mg | ORAL_TABLET | Freq: Every day | ORAL | Status: DC
  Administered 2015-06-15 – 2015-06-21 (×7): 40 mg via ORAL
  Filled 2015-06-14 (×10): qty 1

## 2015-06-14 MED ORDER — SODIUM CHLORIDE 0.9 % IJ SOLN: 3.0000 mL | INTRAMUSCULAR | Status: DC | PRN

## 2015-06-14 MED ORDER — TERBINAFINE HCL 1 % EX CREA
1.0000 | TOPICAL_CREAM | Freq: Every day | CUTANEOUS | Status: DC | PRN
Start: 2015-06-14 — End: 2015-06-21
  Filled 2015-06-14: qty 12

## 2015-06-14 MED ORDER — ONDANSETRON HCL 4 MG/2ML IJ SOLN: 4.0000 mg | Freq: Four times a day (QID) | INTRAMUSCULAR | Status: DC | PRN

## 2015-06-14 MED ORDER — INSULIN GLARGINE 100 UNIT/ML ~~LOC~~ SOLN
60.0000 [IU] | Freq: Two times a day (BID) | SUBCUTANEOUS | Status: DC
  Administered 2015-06-15 – 2015-06-21 (×13): 60 [IU] via SUBCUTANEOUS
  Filled 2015-06-14 (×15): qty 0.6

## 2015-06-14 MED ORDER — LISINOPRIL 20 MG PO TABS
20.0000 mg | ORAL_TABLET | Freq: Every day | ORAL | Status: DC
  Administered 2015-06-15 – 2015-06-21 (×7): 20 mg via ORAL
  Filled 2015-06-14 (×10): qty 1

## 2015-06-14 MED ORDER — INSULIN ASPART 100 UNIT/ML ~~LOC~~ SOLN: 0.0000 [IU] | Freq: Three times a day (TID) | SUBCUTANEOUS | Status: DC

## 2015-06-14 MED ORDER — HYDROCODONE-ACETAMINOPHEN 7.5-325 MG PO TABS
1.0000 | ORAL_TABLET | Freq: Two times a day (BID) | ORAL | Status: DC | PRN
  Administered 2015-06-15: 1 via ORAL
  Filled 2015-06-14: qty 1

## 2015-06-14 NOTE — ED Notes (Signed)
Pt states that he has CHF and his MD tried to increase his fluid pills but his creatinine level then increased and so they had to take him off the extra fluids pills. States that he has continually gained weight. Pt states that he weighted345 lb in April, 2 weeks ago 380 lb. 397 today.

## 2015-06-14 NOTE — Telephone Encounter (Signed)
Thanks

## 2015-06-14 NOTE — Telephone Encounter (Signed)
New message     Pt states he was instructed to go to hospital yesterday or today Pt wants to know if we can go ahead and call hospital and have him admitted instead of going in through e/r due to cost Please call to discuss

## 2015-06-14 NOTE — Telephone Encounter (Signed)
I spoke with pt and told him he would need to be evaluated in ED so appropriate bed placement could be determined.  He understands and will go to ED today

## 2015-06-14 NOTE — ED Provider Notes (Signed)
CSN: [id number]     Arrival date & time 06/14/15  1549 History   First MD Initiated Contact with Patient 06/14/15 1919     Chief Complaint  Patient presents with  . Congestive Heart Failure     (Consider location/radiation/quality/duration/timing/severity/associated sxs/prior Treatment) HPI Comments: Worsening swelling with a 40 lb weight gain over the past few months.  Patient is a 63 y.o. male presenting with CHF. The history is provided by the patient.  Congestive Heart Failure This is a chronic problem. The current episode started more than 1 week ago. The problem occurs constantly. The problem has been gradually worsening. Pertinent negatives include no chest pain, no abdominal pain and no shortness of breath. Nothing aggravates the symptoms. Nothing relieves the symptoms.    Past Medical History  Diagnosis Date  . NICM (nonischemic cardiomyopathy)     echo 12/11: EF 45-50%, grade 1 diast dysfxn, moderately severe AS (AVA 0.9, mean 31 mmHg), mild MR;    cath 1/12:  normal cors, AVA 1.0, peak to peak gradiet for AV 31 mmHg, mod-severe AS, EF 35-40%  . 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 Hx MRSA infection     thigh abcess 11/06  . Anxiety   . Aortic stenosis     AVR 11/29/11  . HTN (hypertension)   . HLD (hyperlipidemia)   . Gout   . Chronic diastolic heart failure     Echo 09/2012: mod LVH, EF 55-65%, Gr 1 DD, AVR ok, mild LAE.  Marland Kitchen Heart murmur   . CHF (congestive heart failure)   . DM2 (diabetes mellitus, type 2)   . Pneumonia 08/2005  . OSA on CPAP   . Hepatitis   . Arthritis     "back" (02/18/2015)  . History of acute renal failure ?12/2013; 07/2014   Past Surgical History  Procedure Laterality Date  . Lumbar laminectomy  10/30/1983  . Aortic valve replacement  11/29/2011    Procedure: AORTIC VALVE REPLACEMENT (AVR);  Surgeon: [name], MD;  Location: [facility] OR;  Service: Open Heart Surgery;  Laterality: N/A;  with nitric oxide  . Right heart  catheterization N/A 11/28/2011    Procedure: RIGHT HEART CATH;  Surgeon: [name], MD;  Location: [facility] CATH LAB;  Service: Cardiovascular;  Laterality: N/A;  . Cardiac valve replacement    . Incision and drainage abscess Left 08/2005    "upper/inner thigh; that's where the MRSA was"  . Back surgery     Family History  Problem Relation Age of Onset  . Heart attack Father   . Cancer Mother    Social History  Substance Use Topics  . Smoking status: Never Smoker   . Smokeless tobacco: Never Used  . Alcohol Use: Yes     Comment: 02/18/2015 "once or twice/yr I'll have a cocktail"    Review of Systems  Constitutional: Negative for fever.  Respiratory: Negative for cough and shortness of breath.   Cardiovascular: Negative for chest pain.  Gastrointestinal: Negative for vomiting and abdominal pain.  All other systems reviewed and are negative.     Allergies  Other  Home Medications   Prior to Admission medications   Medication Sig Start Date End Date Taking? Authorizing Provider  atorvastatin (LIPITOR) 40 MG tablet Take 40 mg by mouth daily at 6 PM.  12/05/14   Historical Provider, MD  BAYER CONTOUR NEXT TEST test strip 1 each by Other route as needed. for testing 01/24/15  Historical Provider, MD  buPROPion (WELLBUTRIN XL) 300 MG 24 hr tablet Take 300 mg by mouth daily.  02/01/15   Historical Provider, MD  colchicine 0.6 MG tablet Take 0.6 mg by mouth daily.  09/01/14   Historical Provider, MD  febuxostat (ULORIC) 40 MG tablet Take 80 mg by mouth daily. Patient taking two 40 mg tablets by mouth daily.    Historical Provider, MD  fenofibrate 54 MG tablet Take 54 mg by mouth daily. 05/04/15   Historical Provider, MD  furosemide (LASIX) 40 MG tablet Take 2 tablets (80 mg total) by mouth 2 (two) times daily. 06/06/15   Ok Anis, NP  HYDROcodone-acetaminophen (NORCO) 7.5-325 MG per tablet Take 1 tablet by mouth every 4 (four) hours as needed for moderate pain.  07/23/14    Historical Provider, MD  insulin glargine (LANTUS) 100 UNIT/ML injection Inject 60 Units into the skin 2 (two) times daily. As directed    Historical Provider, MD  lisinopril (PRINIVIL,ZESTRIL) 20 MG tablet Take 1 tablet (20 mg total) by mouth daily. 02/23/15   Rhonda G Barrett, PA-C  metolazone (ZAROXOLYN) 2.5 MG tablet Take one tablet by mouth daily as needed for wt gain greater than 5 lbs. 05/17/15   [name], MD  NOVOLOG 100 UNIT/ML injection Inject into the skin 3 (three) times daily with meals. Sliding scale 05/12/15   Historical Provider, MD  terbinafine (LAMISIL) 1 % cream Apply 1 application topically daily as needed (for fungus).  07/30/14 07/30/15  Historical Provider, MD   BP 126/69 mmHg  Pulse 89  Temp(Src) 98.2 F (36.8 C) (Oral)  Resp 16  Wt 397 lb 3 oz (180.163 kg)  SpO2 96% Physical Exam  Constitutional: He is oriented to person, place, and time. He appears well-developed and well-nourished. No distress.  HENT:  Head: Normocephalic and atraumatic.  Mouth/Throat: No oropharyngeal exudate.  Eyes: EOM are normal. Pupils are equal, round, and reactive to light.  Neck: Normal range of motion. Neck supple.  Cardiovascular: Normal rate and regular rhythm.  Exam reveals no friction rub.   No murmur heard. Pulmonary/Chest: Effort normal and breath sounds normal. No respiratory distress. He has no wheezes. He has no rales.  Abdominal: He exhibits no distension. There is no tenderness. There is no rebound.  Musculoskeletal: Normal range of motion. He exhibits edema (diffuse, 4+ nonpitting in feet, legs, hands, forearms, abdomen).  Neurological: He is alert and oriented to person, place, and time.  Skin: He is not diaphoretic.  Nursing note and vitals reviewed.   ED Course  Procedures (including critical care time) Labs Review Labs Reviewed  BASIC METABOLIC PANEL - Abnormal; Notable for the following:    Chloride 98 (*)    Glucose, Bld 300 (*)    BUN 32 (*)     Creatinine, Ser 1.40 (*)    GFR calc non Af Amer 52 (*)    GFR calc Af Amer 60 (*)    All other components within normal limits  CBC - Abnormal; Notable for the following:    WBC 12.2 (*)    Hemoglobin 12.0 (*)    HCT 38.0 (*)    MCH 25.3 (*)    All other components within normal limits  BRAIN NATRIURETIC PEPTIDE  HEPATIC FUNCTION PANEL    Imaging Review Dg Chest 2 View  06/14/2015   CLINICAL DATA:  Weight gain, congestive heart failure  EXAM: CHEST  2 VIEW  COMPARISON:  01/30/2012  FINDINGS: Cardiomediastinal silhouette is stable. Study  is limited by poor inspiration. No pulmonary edema. Mild basilar atelectasis. Status post median sternotomy. Mild degenerative changes thoracic spine.  IMPRESSION: No pulmonary edema. Mild basilar atelectasis. Limited study by poor inspiration.   Electronically Signed   By: Natasha Mead M.D.   On: 06/14/2015 17:17   I have personally reviewed and evaluated these images and lab results as part of my medical decision-making.   EKG Interpretation None      MDM   Final diagnoses:  Hypervolemia, unspecified hypervolemia type    65 year old male with history of heart failure presents with swelling. Diffuse swelling in his extremities and abdomen. No shortness of breath or chest pain. Is having worsening orthopnea. He has gained 40 pounds in the past few months because they have taken him off of his Lasix due to some renal insufficiency. No luck with metolazone for diuresis at home and it was stopped. Here he has diffuse swelling in his legs: Fredric Mare, hands. Lungs are clear. BNP is normal, this is normal for him. [facility] consult cardiology for diuresis. 80 of Lasix given IV. Cards admitting.    Elwin Mocha, MD 06/14/15 [phone]

## 2015-06-14 NOTE — H&P (Addendum)
Antonio [name] is an 64 y.o. male.    Chief Complaint: shortness of breath HPI: Antonio [name] is a 64 yo man with PMH of severe aortic stenosis s/p AVR with bioprosthetic aortic valve in January '13, T2DM, obstructive sleep apnea, dyslipidemia, chronic diastolic heart failure (systolic also with improvement per notes) who follows with Dr. Julianne Handler last seen earlier in August with weight at 384 lbs who presents with worsening dyspnea and weight gain to 297 lbs. He has frequent admissions for heart failure last in April 2016. His lasix was increased from 80 mg bid to 120 mg bid yet he has continued to gain weight. He doesn't cook but tries to eat at places with lower salt content. He denies recent infections, no fevers/chills/nausea/vomiting/diarrhea. No significant recent travel. He really only notes decreased UOP. No dysuria.       Past Medical History  Diagnosis Date  . NICM (nonischemic cardiomyopathy)     echo 12/11: EF 45-50%, grade 1 diast dysfxn, moderately severe AS (AVA 0.9, mean 31 mmHg), mild MR;    cath 1/12:  normal cors, AVA 1.0, peak to peak gradiet for AV 31 mmHg, mod-severe AS, EF 35-40%  . 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 Hx MRSA infection     thigh abcess 11/06  . Anxiety   . Aortic stenosis     AVR 11/29/11  . HTN (hypertension)   . HLD (hyperlipidemia)   . Gout   . Chronic diastolic heart failure     Echo 09/2012: mod LVH, EF 55-65%, Gr 1 DD, AVR ok, mild LAE.  Marland Kitchen Heart murmur   . CHF (congestive heart failure)   . DM2 (diabetes mellitus, type 2)   . Pneumonia 08/2005  . OSA on CPAP   . Hepatitis   . Arthritis     "back" (02/18/2015)  . History of acute renal failure ?12/2013; 07/2014    Past Surgical History  Procedure Laterality Date  . Lumbar laminectomy  10/30/1983  . Aortic valve replacement  11/29/2011    Procedure: AORTIC VALVE REPLACEMENT (AVR);  Surgeon: Tharon Aquas Adelene Idler, MD;  Location: [facility];  Service: Open Heart Surgery;  Laterality:  N/A;  with nitric oxide  . Right heart catheterization N/A 11/28/2011    Procedure: RIGHT HEART CATH;  Surgeon: Burnell Blanks, MD;  Location: [facility] CATH LAB;  Service: Cardiovascular;  Laterality: N/A;  . Cardiac valve replacement    . Incision and drainage abscess Left 08/2005    "upper/inner thigh; that's where the MRSA was"  . Back surgery      Family History  Problem Relation Age of Onset  . Heart attack Father   . Cancer Mother    Social History:  reports that he has never smoked. He has never used smokeless tobacco. He reports that he drinks alcohol. He reports that he does not use illicit drugs.  Allergies:  Allergies  Allergen Reactions  . Other Other (See Comments)    Salt causes edema.     (Not in a hospital admission)  Results for orders placed or performed during the hospital encounter of 06/14/15 (from the past 48 hour(s))  Basic metabolic panel     Status: Abnormal   Collection Time: 06/14/15  4:55 PM  Result Value Ref Range   Sodium 136 135 - 145 mmol/L   Potassium 4.0 3.5 - 5.1 mmol/L   Chloride 98 (L) 101 - 111 mmol/L   CO2 27  22 - 32 mmol/L   Glucose, Bld 300 (H) 65 - 99 mg/dL   BUN 32 (H) 6 - 20 mg/dL   Creatinine, Ser 1.40 (H) 0.61 - 1.24 mg/dL   Calcium 9.0 8.9 - 10.3 mg/dL   GFR calc non Af Amer 52 (L) >60 mL/min   GFR calc Af Amer 60 (L) >60 mL/min    Comment: (NOTE) The eGFR has been calculated using the CKD EPI equation. This calculation has not been validated in all clinical situations. eGFR's persistently <60 mL/min signify possible Chronic Kidney Disease.    Anion gap 11 5 - 15  CBC     Status: Abnormal   Collection Time: 06/14/15  4:55 PM  Result Value Ref Range   WBC 12.2 (H) 4.0 - 10.5 K/uL   RBC 4.75 4.22 - 5.81 MIL/uL   Hemoglobin 12.0 (L) 13.0 - 17.0 g/dL   HCT 38.0 (L) 39.0 - 52.0 %   MCV 80.0 78.0 - 100.0 fL   MCH 25.3 (L) 26.0 - 34.0 pg   MCHC 31.6 30.0 - 36.0 g/dL   RDW 13.8 11.5 - 15.5 %   Platelets 292 150 - 400  K/uL  Brain natriuretic peptide     Status: None   Collection Time: 06/14/15  4:55 PM  Result Value Ref Range   B Natriuretic Peptide 49.4 0.0 - 100.0 pg/mL  Hepatic function panel     Status: Abnormal   Collection Time: 06/14/15  7:55 PM  Result Value Ref Range   Total Protein 7.2 6.5 - 8.1 g/dL   Albumin 3.2 (L) 3.5 - 5.0 g/dL   AST 26 15 - 41 U/L   ALT 23 17 - 63 U/L   Alkaline Phosphatase 116 38 - 126 U/L   Total Bilirubin 0.4 0.3 - 1.2 mg/dL   Bilirubin, Direct <0.1 (L) 0.1 - 0.5 mg/dL    Comment: REPEATED TO VERIFY   Indirect Bilirubin NOT CALCULATED 0.3 - 0.9 mg/dL   Dg Chest 2 View  06/14/2015   CLINICAL DATA:  Weight gain, congestive heart failure  EXAM: CHEST  2 VIEW  COMPARISON:  01/30/2012  FINDINGS: Cardiomediastinal silhouette is stable. Study is limited by poor inspiration. No pulmonary edema. Mild basilar atelectasis. Status post median sternotomy. Mild degenerative changes thoracic spine.  IMPRESSION: No pulmonary edema. Mild basilar atelectasis. Limited study by poor inspiration.   Electronically Signed   By: Lahoma Crocker M.D.   On: 06/14/2015 17:17    Review of Systems  Constitutional: Positive for malaise/fatigue. Negative for fever, chills and weight loss.  HENT: Negative for tinnitus.   Eyes: Negative for double vision and photophobia.  Respiratory: Positive for shortness of breath. Negative for cough.   Cardiovascular: Positive for orthopnea and leg swelling. Negative for palpitations.  Gastrointestinal: Negative for nausea, vomiting and abdominal pain.  Genitourinary: Negative for dysuria, urgency and frequency.  Musculoskeletal: Negative for myalgias and neck pain.  Skin: Negative for rash.  Neurological: Negative for dizziness, tremors, sensory change and headaches.  Endo/Heme/Allergies: Negative for environmental allergies.  Psychiatric/Behavioral: Negative for depression, suicidal ideas and substance abuse.    Blood pressure 135/66, pulse 90, temperature  98.2 F (36.8 C), temperature source Oral, resp. rate 16, weight 180.163 kg (397 lb 3 oz), SpO2 94 %. Physical Exam  Nursing note and vitals reviewed. Constitutional: He is oriented to person, place, and time. He appears well-developed and well-nourished. No distress.  HENT:  Head: Normocephalic and atraumatic.  Nose: Nose normal.  Mouth/Throat: Oropharynx  is clear and moist. No oropharyngeal exudate.  Eyes: Conjunctivae and EOM are normal. Pupils are equal, round, and reactive to light. No scleral icterus.  Neck: JVD present. No tracheal deviation present.  JVP midneck  Cardiovascular: Normal rate, regular rhythm, normal heart sounds and intact distal pulses.   No murmur heard. Respiratory: Effort normal and breath sounds normal. No respiratory distress. He has no wheezes.  GI: Soft. Bowel sounds are normal. He exhibits no distension. There is no tenderness. There is no rebound.  Musculoskeletal: Normal range of motion. He exhibits edema. He exhibits no tenderness.  Neurological: He is alert and oriented to person, place, and time. He has normal reflexes. No cranial nerve deficit.  Skin: Skin is warm and dry. No rash noted. He is not diaphoretic. No erythema.  Right second toe dark/black  Psychiatric: He has a normal mood and affect. His behavior is normal. Thought content normal.    Labs reviewed; bun/cr 32/1.4, glucose 300 Chest x-ray: increased pulmonary vascularity  Glucose 300  Wbc 12.2 Echo 4/16: EF 55%, mild to moderate RV dysfunction Assessment/Plan Antonio [name] is a 64 yo man with PMH of severe aortic stenosis s/p AVR with bioprosthetic aortic valve in January '13, T2DM, obstructive sleep apnea, dyslipidemia, chronic diastolic heart failure (systolic also with improvement per notes) who follows with Dr. Julianne Handler last seen earlier in August with weight at 384 lbs who presents with worsening dyspnea and weight gain to 297 lbs. Differential diagnosis for progressive heart  failure includes worsening progression of disease, triggered by UTI/infection, ischemia, thyroid diseaes among other etiologies. Evaluate for new triggers and will trial aggressive diuresis. If any limitations, low threshold to reassess filling pressures.  1. Acute on chronic diastolic heart failure 2. Hyperglycemia with T2DM 3. Severe AS s/p bioprosthetic AV with good/physiologic gradient April 2016 4. Dyslipidemia  5. OSA 6. Gout Plan.  1. IV lasix 100 mg bid 2. Continue lisinopril 20 mg daily  3. Continue home lantus 60 units bid with insulin sliding scale 4. Update TSH, obtain urinalysis, trend troponins, place on telemetry  5. No repeat echo given recent Echo 6. Follow right 2nd toe; consider vascular surgery consult   Cherilyn Sautter 06/14/2015, 9:12 PM

## 2015-06-14 NOTE — ED Notes (Signed)
Pt has been given a meal X 2.

## 2015-06-15 ENCOUNTER — Encounter (HOSPITAL_COMMUNITY): Payer: Self-pay | Admitting: General Practice

## 2015-06-15 DIAGNOSIS — I10 Essential (primary) hypertension: Secondary | ICD-10-CM

## 2015-06-15 DIAGNOSIS — E785 Hyperlipidemia, unspecified: Secondary | ICD-10-CM

## 2015-06-15 DIAGNOSIS — I5033 Acute on chronic diastolic (congestive) heart failure: Principal | ICD-10-CM

## 2015-06-15 LAB — COMPREHENSIVE METABOLIC PANEL
ALT: 22 U/L (ref 17–63)
AST: 23 U/L (ref 15–41)
Albumin: 3 g/dL — ABNORMAL LOW (ref 3.5–5.0)
Alkaline Phosphatase: 105 U/L (ref 38–126)
Anion gap: 8 (ref 5–15)
BUN: 32 mg/dL — AB (ref 6–20)
CHLORIDE: 99 mmol/L — AB (ref 101–111)
CO2: 30 mmol/L (ref 22–32)
Calcium: 8.8 mg/dL — ABNORMAL LOW (ref 8.9–10.3)
Creatinine, Ser: 1.38 mg/dL — ABNORMAL HIGH (ref 0.61–1.24)
GFR calc Af Amer: >60 mL/min (ref >60)
GFR, EST NON AFRICAN AMERICAN: 53 mL/min — AB (ref >60)
Glucose, Bld: 355 mg/dL — ABNORMAL HIGH (ref 65–99)
POTASSIUM: 3.6 mmol/L (ref 3.5–5.1)
Sodium: 137 mmol/L (ref 135–145)
Total Bilirubin: 0.7 mg/dL (ref 0.3–1.2)
Total Protein: 6.6 g/dL (ref 6.5–8.1)

## 2015-06-15 LAB — PROTIME-INR
INR: 1.32 (ref 0.00–1.49)
PROTHROMBIN TIME: 16.5 s — AB (ref 11.6–15.2)

## 2015-06-15 LAB — URINALYSIS, ROUTINE W REFLEX MICROSCOPIC
BILIRUBIN URINE: NEGATIVE
GLUCOSE, UA: 250 mg/dL — AB
KETONES UR: NEGATIVE mg/dL
LEUKOCYTES UA: NEGATIVE
Nitrite: NEGATIVE
PROTEIN: NEGATIVE mg/dL
Specific Gravity, Urine: 1.01 (ref 1.005–1.030)
Urobilinogen, UA: 0.2 mg/dL (ref 0.0–1.0)
pH: 5 (ref 5.0–8.0)

## 2015-06-15 LAB — TROPONIN I: Troponin I: <0.03 ng/mL (ref <0.031)

## 2015-06-15 LAB — CBC
HCT: 37.5 % — ABNORMAL LOW (ref 39.0–52.0)
Hemoglobin: 11.7 g/dL — ABNORMAL LOW (ref 13.0–17.0)
MCH: 25.2 pg — ABNORMAL LOW (ref 26.0–34.0)
MCHC: 31.2 g/dL (ref 30.0–36.0)
MCV: 80.6 fL (ref 78.0–100.0)
PLATELETS: 251 10*3/uL (ref 150–400)
RBC: 4.65 MIL/uL (ref 4.22–5.81)
RDW: 13.9 % (ref 11.5–15.5)
WBC: 11.9 10*3/uL — ABNORMAL HIGH (ref 4.0–10.5)

## 2015-06-15 LAB — URINE MICROSCOPIC-ADD ON

## 2015-06-15 LAB — BASIC METABOLIC PANEL
ANION GAP: 8 (ref 5–15)
BUN: 33 mg/dL — ABNORMAL HIGH (ref 6–20)
CALCIUM: 8.5 mg/dL — AB (ref 8.9–10.3)
CHLORIDE: 96 mmol/L — AB (ref 101–111)
CO2: 30 mmol/L (ref 22–32)
Creatinine, Ser: 1.26 mg/dL — ABNORMAL HIGH (ref 0.61–1.24)
GFR calc non Af Amer: 59 mL/min — ABNORMAL LOW (ref >60)
Glucose, Bld: 278 mg/dL — ABNORMAL HIGH (ref 65–99)
Potassium: 3.4 mmol/L — ABNORMAL LOW (ref 3.5–5.1)
Sodium: 134 mmol/L — ABNORMAL LOW (ref 135–145)

## 2015-06-15 LAB — GLUCOSE, CAPILLARY
GLUCOSE-CAPILLARY: 232 mg/dL — AB (ref 65–99)
GLUCOSE-CAPILLARY: 250 mg/dL — AB (ref 65–99)
GLUCOSE-CAPILLARY: 306 mg/dL — AB (ref 65–99)
Glucose-Capillary: 228 mg/dL — ABNORMAL HIGH (ref 65–99)
Glucose-Capillary: 161 mg/dL — ABNORMAL HIGH (ref 65–99)

## 2015-06-15 LAB — D-DIMER, QUANTITATIVE (NOT AT ARMC): D DIMER QUANT: 0.4 ug{FEU}/mL (ref 0.00–0.48)

## 2015-06-15 LAB — MRSA PCR SCREENING: MRSA BY PCR: NEGATIVE

## 2015-06-15 LAB — MAGNESIUM: Magnesium: 1.7 mg/dL (ref 1.7–2.4)

## 2015-06-15 LAB — TSH: TSH: 5.293 u[IU]/mL — AB (ref 0.350–4.500)

## 2015-06-15 LAB — T4, FREE: FREE T4: 0.79 ng/dL (ref 0.61–1.12)

## 2015-06-15 MED ORDER — INSULIN GLARGINE 100 UNIT/ML ~~LOC~~ SOLN
60.0000 [IU] | Freq: Once | SUBCUTANEOUS | Status: AC
  Administered 2015-06-15: 60 [IU] via SUBCUTANEOUS
  Filled 2015-06-15: qty 0.6

## 2015-06-15 MED ORDER — FENOFIBRATE 54 MG PO TABS
54.0000 mg | ORAL_TABLET | Freq: Once | ORAL | Status: AC
  Administered 2015-06-15: 54 mg via ORAL
  Filled 2015-06-15: qty 1

## 2015-06-15 MED ORDER — ATORVASTATIN CALCIUM 40 MG PO TABS
40.0000 mg | ORAL_TABLET | Freq: Once | ORAL | Status: AC
  Administered 2015-06-15: 40 mg via ORAL
  Filled 2015-06-15: qty 1

## 2015-06-15 MED ORDER — POTASSIUM CHLORIDE CRYS ER 20 MEQ PO TBCR
40.0000 meq | EXTENDED_RELEASE_TABLET | Freq: Once | ORAL | Status: AC
  Administered 2015-06-15: 40 meq via ORAL
  Filled 2015-06-15: qty 2

## 2015-06-15 MED ORDER — COLCHICINE 0.6 MG PO TABS
0.6000 mg | ORAL_TABLET | Freq: Once | ORAL | Status: AC
  Administered 2015-06-15: 0.6 mg via ORAL
  Filled 2015-06-15: qty 1

## 2015-06-15 MED ORDER — INSULIN ASPART 100 UNIT/ML ~~LOC~~ SOLN
0.0000 [IU] | Freq: Three times a day (TID) | SUBCUTANEOUS | Status: DC
  Administered 2015-06-15 (×2): 7 [IU] via SUBCUTANEOUS
  Administered 2015-06-15: 15 [IU] via SUBCUTANEOUS
  Administered 2015-06-15: 4 [IU] via SUBCUTANEOUS
  Administered 2015-06-16 (×2): 7 [IU] via SUBCUTANEOUS
  Administered 2015-06-16: 4 [IU] via SUBCUTANEOUS
  Administered 2015-06-17 (×2): 7 [IU] via SUBCUTANEOUS
  Administered 2015-06-17: 4 [IU] via SUBCUTANEOUS
  Administered 2015-06-18: 7 [IU] via SUBCUTANEOUS
  Administered 2015-06-18: 4 [IU] via SUBCUTANEOUS
  Administered 2015-06-18: 7 [IU] via SUBCUTANEOUS
  Administered 2015-06-19: 4 [IU] via SUBCUTANEOUS
  Administered 2015-06-19 – 2015-06-20 (×4): 11 [IU] via SUBCUTANEOUS
  Administered 2015-06-20: 7 [IU] via SUBCUTANEOUS
  Administered 2015-06-21: 11 [IU] via SUBCUTANEOUS
  Administered 2015-06-21: 15 [IU] via SUBCUTANEOUS
  Administered 2015-06-21: 7 [IU] via SUBCUTANEOUS

## 2015-06-15 MED ORDER — FEBUXOSTAT 40 MG PO TABS
40.0000 mg | ORAL_TABLET | Freq: Once | ORAL | Status: AC
  Administered 2015-06-15: 40 mg via ORAL
  Filled 2015-06-15: qty 1

## 2015-06-15 NOTE — Progress Notes (Signed)
Patient Name: Antonio [name] Date of Encounter: 06/15/2015  Principal Problem:   Acute on chronic diastolic heart failure Active Problems:   Hyperlipidemia   HYPERTENSION, BENIGN SYSTEMIC   Primary Cardiologist: Dr. Clifton James Patient Profile: 64 yo male w/ PMH of severe aortic stenosis (s/p AVR 10/2011), T2DM, OSA, HLD, and chronic diastolic HR admitted on 06/14/15 for worsening dyspnea and weight gain (345lbs 04/16, 384lbs 05/30/15 and  397lbs on admission).   SUBJECTIVE: Feeling tired after being admitted last night. Reports his breathing has mildly improved. Says his skin feels very tight everywhere. Denies any chest pain or palpitations.   OBJECTIVE Filed Vitals:   06/14/15 2145 06/14/15 2318 06/14/15 2339 06/15/15 0557  BP: 121/71 152/38  132/68  Pulse: 99 95  89  Temp:  97.8 F (36.6 C)  98 F (36.7 C)  TempSrc:  Oral  Oral  Resp:  18  17  Height:   5\' 11"  (1.803 m)   Weight:   393 lb 4.8 oz (178.4 kg) 393 lb (178.264 kg)  SpO2: 92% 94%  93%    Intake/Output Summary (Last 24 hours) at 06/15/15 1014 Last data filed at 06/15/15 0739  Gross per 24 hour  Intake    500 ml  Output   2325 ml  Net  -1825 ml   Filed Weights   06/14/15 1639 06/14/15 2339 06/15/15 0557  Weight: 397 lb 3 oz (180.163 kg) 393 lb 4.8 oz (178.4 kg) 393 lb (178.264 kg)    PHYSICAL EXAM General: Obese, Caucasian male in no acute distress. Head: Normocephalic, atraumatic.  Neck: Supple without bruits, JVD not able to be assessed due to body habitus. Lungs:  Resp regular and unlabored, CTA without wheezing Heart: RRR, S1, S2, no S3, S4, or murmur; no rub. Abdomen: Soft, non-tender,  normoactive bowel sounds. No hepatomegaly. No rebound/guarding. No obvious abdominal masses. Extremities: No clubbing, no cyanosis, 2+ pitting edema bilaterally. Distal pedal pulses are 2+ bilaterally. Right 2nd and 3rd toes are brownish-red in coloration. Without any open sores. Neuro: Alert and oriented X 3. Moves  all extremities spontaneously. Psych: Normal affect.   LABS: CBC: Recent Labs  06/14/15 1655 06/15/15 0025  WBC 12.2* 11.9*  HGB 12.0* 11.7*  HCT 38.0* 37.5*  MCV 80.0 80.6  PLT 292 251   INR: Recent Labs  06/15/15 0025  INR 1.32   Basic Metabolic Panel: Recent Labs  06/15/15 0025 06/15/15 0507  NA 137 134*  K 3.6 3.4*  CL 99* 96*  CO2 30 30  GLUCOSE 355* 278*  BUN 32* 33*  CREATININE 1.38* 1.26*  CALCIUM 8.8* 8.5*  MG 1.7  --    Liver Function Tests: Recent Labs  06/14/15 1955 06/15/15 0025  AST 26 23  ALT 23 22  ALKPHOS 116 105  BILITOT 0.4 0.7  PROT 7.2 6.6  ALBUMIN 3.2* 3.0*   Cardiac Enzymes: Recent Labs  06/15/15 0025 06/15/15 0507  TROPONINI <0.03 <0.03   BNP:  B NATRIURETIC PEPTIDE  Date/Time Value Ref Range Status  06/14/2015 04:55 PM 49.4 0.0 - 100.0 pg/mL Final  02/18/2015 09:16 PM 67.3 0.0 - 100.0 pg/mL Final   Thyroid Function Tests: Recent Labs  06/15/15 0025  TSH 5.293*   TELE: NSR with rate in 80's 90's. Occasional PVC's.  Radiology/Studies: Dg Chest 2 View  06/14/2015   CLINICAL DATA:  Weight gain, congestive heart failure  EXAM: CHEST  2 VIEW  COMPARISON:  01/30/2012  FINDINGS: Cardiomediastinal silhouette is stable. Study  is limited by poor inspiration. No pulmonary edema. Mild basilar atelectasis. Status post median sternotomy. Mild degenerative changes thoracic spine.  IMPRESSION: No pulmonary edema. Mild basilar atelectasis. Limited study by poor inspiration.   Electronically Signed   By: Natasha Mead M.D.   On: 06/14/2015 17:17     Current Medications:  . atorvastatin  40 mg Oral q1800  . enoxaparin (LOVENOX) injection  40 mg Subcutaneous Q24H  . febuxostat  40 mg Oral Daily  . fenofibrate  54 mg Oral Daily  . furosemide  100 mg Intravenous BID  . insulin aspart  0-20 Units Subcutaneous TID WC  . insulin glargine  60 Units Subcutaneous BID  . lisinopril  20 mg Oral Daily  . silver sulfADIAZINE  1 application  Topical BID  . sodium chloride  3 mL Intravenous Q12H      ASSESSMENT AND PLAN:  1. Acute on chronic diastolic heart failure - Possible triggers include UTI, infection, ischemia, and thyroid disorder. - TSH elevated to 5.293 on 06/15/15. Ordering Free T4 today. - Cyclic troponin values have been negative this far. Patient without chest pain. - Echo on 02/20/15 showed EF of 55%. - Currently on IV Lasix  BID. Continue aggressive IV diuresis. - Creatinine 1.26 on 06/15/15. Has been variable from 1.02 - 2.08 in the past year. Will need to continue to monitor BMET with diuresis. - Continue Lisinopril  daily.  2. Severe AS  - s/p bioprosthetic AV 10/2011.  - Echo on 02/22/15 showed good/physiologic gradient.  3. Type 2 DM - Lantus 60U BID with sliding scale.  4. HLD - continue statin  5. Hypokalemia - Potassium 3.4 on 06/15/15. Supplementation given this AM. - Will need to monitor with BMET and replete as needed.  6. Right Second Toe - patient says it has been discolored for quite some time due to  recurrent blood blisters. - is followed by a Podiatrist in the outpatient setting.   Signed, Wandra Mannan , PA-C 10:14 AM 06/15/2015

## 2015-06-15 NOTE — Progress Notes (Signed)
Utilization review completed.  

## 2015-06-15 NOTE — Progress Notes (Signed)
Pt reports not taking any prescribed medications today, CBG 306, MD notified and ordered missed medications and insulin, RN will continue to monitor, pt bed in lowest position, pt call bell within reach.   Johnson,Whittney Steenson A  06/15/2015

## 2015-06-16 LAB — BASIC METABOLIC PANEL
ANION GAP: 9 (ref 5–15)
BUN: 36 mg/dL — ABNORMAL HIGH (ref 6–20)
CALCIUM: 8.6 mg/dL — AB (ref 8.9–10.3)
CO2: 31 mmol/L (ref 22–32)
CREATININE: 1.26 mg/dL — AB (ref 0.61–1.24)
Chloride: 99 mmol/L — ABNORMAL LOW (ref 101–111)
GFR, EST NON AFRICAN AMERICAN: 59 mL/min — AB (ref >60)
GLUCOSE: 242 mg/dL — AB (ref 65–99)
Potassium: 3.7 mmol/L (ref 3.5–5.1)
Sodium: 139 mmol/L (ref 135–145)

## 2015-06-16 LAB — GLUCOSE, CAPILLARY
GLUCOSE-CAPILLARY: 168 mg/dL — AB (ref 65–99)
GLUCOSE-CAPILLARY: 236 mg/dL — AB (ref 65–99)
GLUCOSE-CAPILLARY: 185 mg/dL — AB (ref 65–99)
Glucose-Capillary: 205 mg/dL — ABNORMAL HIGH (ref 65–99)

## 2015-06-16 MED ORDER — ZOLPIDEM TARTRATE 5 MG PO TABS: 5.0000 mg | ORAL_TABLET | Freq: Every evening | ORAL | Status: DC | PRN

## 2015-06-16 MED ORDER — FUROSEMIDE 10 MG/ML IJ SOLN
100.0000 mg | Freq: Three times a day (TID) | INTRAVENOUS | Status: DC
  Administered 2015-06-16 – 2015-06-19 (×11): 100 mg via INTRAVENOUS
  Filled 2015-06-16 (×15): qty 10

## 2015-06-16 MED ORDER — POTASSIUM CHLORIDE CRYS ER 20 MEQ PO TBCR
40.0000 meq | EXTENDED_RELEASE_TABLET | Freq: Once | ORAL | Status: AC
  Administered 2015-06-16: 40 meq via ORAL
  Filled 2015-06-16: qty 2

## 2015-06-16 NOTE — Progress Notes (Signed)
Patient Name: Antonio [name] Date of Encounter: 06/16/2015  Principal Problem:   Acute on chronic diastolic heart failure Active Problems:   Hyperlipidemia   HYPERTENSION, BENIGN SYSTEMIC    Primary Cardiologist: Dr. Clifton James Patient Profile: 64 yo male w/ PMH of severe aortic stenosis (s/p AVR 10/2011), T2DM, OSA, HLD, and chronic diastolic HR admitted on 06/14/15 for worsening dyspnea and weight gain (345lbs 04/16, 384lbs 05/30/15 and 397lbs on admission).   SUBJECTIVE: Denies any chest pain or palpitations. Feels like his lower extremity edema is improving. Reports trouble falling asleep last night and requests a sleeping aid to be available in case he needs it while in the hospital.  OBJECTIVE Filed Vitals:   06/14/15 2339 06/15/15 0557 06/15/15 1500 06/16/15 0601  BP:  132/68 118/57 131/62  Pulse:  89 89 81  Temp:  98 F (36.7 C) 98.6 F (37 C) 98 F (36.7 C)  TempSrc:  Oral Oral Oral  Resp:  17 18   Height:  (1.803 m)     Weight: 393 lb 4.8 oz (178.4 kg) 393 lb (178.264 kg)  392 lb 14.4 oz (178.218 kg)  SpO2:  93% 94% 93%    Intake/Output Summary (Last 24 hours) at 06/16/15 1610 Last data filed at 06/16/15 0600  Gross per 24 hour  Intake    240 ml  Output   2825 ml  Net  -2585 ml   Filed Weights   06/14/15 2339 06/15/15 0557 06/16/15 0601  Weight: 393 lb 4.8 oz (178.4 kg) 393 lb (178.264 kg) 392 lb 14.4 oz (178.218 kg)    PHYSICAL EXAM General: Obese male in no acute distress. Head: Normocephalic, atraumatic.  Neck: Supple without bruits, JVD not able to be assessed due to body habitus. Lungs:  Resp regular and unlabored, CTA without wheezing or notable rales. Heart: RRR, S1, S2, no S3, S4, or murmur; no rub. Abdomen: Soft, non-tender, non-distended with normoactive bowel sounds. No hepatomegaly. No rebound/guarding. No obvious abdominal masses. Extremities: No clubbing, no cyanosis, 2+ pitting edema bilaterally. Distal pedal pulses are 2+  bilaterally. Neuro: Alert and oriented X 3. Moves all extremities spontaneously. Psych: Normal affect.  LABS: CBC: Recent Labs  06/14/15 1655 06/15/15 0025  WBC 12.2* 11.9*  HGB 12.0* 11.7*  HCT 38.0* 37.5*  MCV 80.0 80.6  PLT 292 251   INR: Recent Labs  06/15/15 0025  INR 1.32   Basic Metabolic Panel: Recent Labs  06/15/15 0025 06/15/15 0507 06/16/15 0337  NA 137 134* 139  K 3.6 3.4* 3.7  CL 99* 96* 99*  CO2 GLUCOSE 355* 278* 242*  BUN 32* 33* 36*  CREATININE 1.38* 1.26* 1.26*  CALCIUM 8.8* 8.5* 8.6*  MG 1.7  --   --    Liver Function Tests: Recent Labs  06/14/15 1955 06/15/15 0025  AST 26 23  ALT 23 22  ALKPHOS 116 105  BILITOT 0.4 0.7  PROT 7.2 6.6  ALBUMIN 3.2* 3.0*   Cardiac Enzymes: Recent Labs  06/15/15 0025 06/15/15 0507 06/15/15 1135  TROPONINI <0.03 <0.03 <0.03   BNP:  B NATRIURETIC PEPTIDE  Date/Time Value Ref Range Status  06/14/2015 04:55 PM 49.4 0.0 - 100.0 pg/mL Final  02/18/2015 09:16 PM 67.3 0.0 - 100.0 pg/mL Final   D-dimer: Recent Labs  06/15/15 1445  DDIMER 0.40   Thyroid Function Tests: Recent Labs  06/15/15 0025  TSH 5.293*  Free T4: 0.79   TELE: Sinus rhythm with rate in  80's -90's. Occasional PVC's.       Radiology/Studies: Dg Chest 2 View: 06/14/2015   CLINICAL DATA:  Weight gain, congestive heart failure  EXAM: CHEST  2 VIEW  COMPARISON:  01/30/2012  FINDINGS: Cardiomediastinal silhouette is stable. Study is limited by poor inspiration. No pulmonary edema. Mild basilar atelectasis. Status post median sternotomy. Mild degenerative changes thoracic spine.  IMPRESSION: No pulmonary edema. Mild basilar atelectasis. Limited study by poor inspiration.   Electronically Signed   By: Natasha Mead M.D.   On: 06/14/2015 17:17     Current Medications:  . atorvastatin  40 mg Oral q1800  . enoxaparin (LOVENOX) injection  40 mg Subcutaneous Q24H  . febuxostat  40 mg Oral Daily  . fenofibrate  54 mg Oral Daily    . furosemide  100 mg Intravenous BID  . insulin aspart  0-20 Units Subcutaneous TID WC  . insulin glargine  60 Units Subcutaneous BID  . lisinopril  20 mg Oral Daily  . silver sulfADIAZINE  1 application Topical BID  . sodium chloride  3 mL Intravenous Q12H     ASSESSMENT AND PLAN: 1. Acute on chronic diastolic heart failure - Possible triggers include UTI, infection, ischemia, and thyroid disorder. - TSH elevated to 5.293 on 06/15/15, however T4 was 0.79 (WNL), so this points to subclinical hypothyroidism. Patient is without overt symptoms at this time. - Cyclic troponin values negative. D-dimer negative. - Currently on IV Lasix 100mg  BID. Net -4.5 L thus far. Continue aggressive IV diuresis. - Creatinine 1.26 on 06/16/15. Has been variable from 1.02 - 2.08 in the past year. Will need to continue to monitor BMET with diuresis. - Continue Lisinopril 20mg  daily.  2. Severe AS  - s/p bioprosthetic AV 10/2011.  - Echo on 02/22/15 showed good/physiologic gradient.  3. Type 2 DM - Lantus 60U BID with sliding scale.  4. HLD - continue statin  5. Hypokalemia - Potassium 3.7 on 06/16/15.  - Will need to monitor with BMET and replete as needed.  6. Right Second Toe - patient says it has been discolored for quite some time due to recurrent blood blisters and he is followed by a Podiatrist in the outpatient setting.  Signed, Wandra Mannan , PA-C 8:22 AM 06/16/2015

## 2015-06-16 NOTE — Evaluation (Signed)
Physical Therapy Evaluation and Discharge Patient Details Name: Antonio [name] MRN: [id number] DOB: November 12, 1950 Today's Date: 06/16/2015   History of Present Illness  Pt is a 64 y/o male with a PMH of severe aortic stenosis s/p AVR with bioprosthetic aortic valve in 10/2011, DMII, CHF, who presents with worsening dyspnea and weight gain 384lbs to 397lbs.   Clinical Impression  Patient evaluated by Physical Therapy with no further acute PT needs identified. All education has been completed and the patient has no further questions. Pt is grossly functioning at a mod I level at this time. He was able to mobilize around the unit with a RW without difficulty or any noted unsteadiness. Discussed follow-up therapy and pt declines HHPT or outpatient therapy at this time. See below for any follow-up Physial Therapy or equipment needs. Pt will require a new RW as the one he currently has does not adjust to accommodate his height. PT is signing off. Thank you for this referral.     Follow Up Recommendations Outpatient PT    Equipment Recommendations  Rolling walker with 5" wheels;3in1 (PT) (3in1/shower chair - which ever he can get with insurance)    Recommendations for Other Services       Precautions / Restrictions Restrictions Weight Bearing Restrictions: No      Mobility  Bed Mobility               General bed mobility comments: Pt sitting up in recliner upon PT arrival.   Transfers Overall transfer level: Modified independent Equipment used: Rolling walker (2 wheeled)                Ambulation/Gait Ambulation/Gait assistance: Modified independent (Device/Increase time) Ambulation Distance (Feet): 200 Feet Assistive device: Rolling walker (2 wheeled) Gait Pattern/deviations: Step-through pattern;Decreased stride length;Wide base of support Gait velocity: Decreased Gait velocity interpretation: Below normal speed for age/gender General Gait Details: No unsteadiness, no LOB,  and no assist required.   Stairs            Wheelchair Mobility    Modified Rankin (Stroke Patients Only)       Balance Overall balance assessment: No apparent balance deficits (not formally assessed)                                           Pertinent Vitals/Pain Pain Assessment: Faces Faces Pain Scale: Hurts even more Pain Location: feet during ambulation Pain Descriptors / Indicators: Aching Pain Intervention(s): Limited activity within patient's tolerance;Monitored during session;Repositioned    Home Living Family/patient expects to be discharged to:: Private residence Living Arrangements: Alone Available Help at Discharge: Family;Available PRN/intermittently Type of Home: House Home Access: Stairs to enter Entrance Stairs-Rails: Right Entrance Stairs-Number of Steps: 3 Home Layout: One level Home Equipment: Walker - 2 wheels Additional Comments: The walker pt has is present in room during session. He purchased this walker at a yard sale and it is not an appropriate height for him and cannot be adjusted any more.    Prior Function Level of Independence: Independent with assistive device(s)         Comments: Pt reports he drives and uses the motorized cart at the grocery store. He does not cook and mostly has meals delivered. He had hired help to clean and do some cooking at home but it appears he does not have this assistance available any more.  Hand Dominance        Extremity/Trunk Assessment   Upper Extremity Assessment: Overall WFL for tasks assessed           Lower Extremity Assessment: Overall WFL for tasks assessed (Bilateral edema in feet/lower legs. Noted blisters on B feet)      Cervical / Trunk Assessment: Normal  Communication   Communication: No difficulties  Cognition Arousal/Alertness: Awake/alert Behavior During Therapy: WFL for tasks assessed/performed Overall Cognitive Status: Within Functional Limits  for tasks assessed                      General Comments      Exercises        Assessment/Plan    PT Assessment Patent does not need any further PT services  PT Diagnosis Difficulty walking;Acute pain   PT Problem List    PT Treatment Interventions     PT Goals (Current goals can be found in the Care Plan section) Acute Rehab PT Goals PT Goal Formulation: All assessment and education complete, DC therapy    Frequency     Barriers to discharge        Co-evaluation               End of Session   Activity Tolerance: Patient tolerated treatment well Patient left: in chair;with call bell/phone within reach;with nursing/sitter in room Nurse Communication: Mobility status         Time: 9604-5409 PT Time Calculation (min) (ACUTE ONLY): 24 min   Charges:   PT Evaluation $Initial PT Evaluation Tier I: 1 Procedure PT Treatments $Gait Training: 8-22 mins   PT G Codes:        Conni Slipper 07-03-2015, 3:09 PM   Conni Slipper, PT, DPT Acute Rehabilitation Services Pager: [phone]

## 2015-06-17 LAB — GLUCOSE, CAPILLARY
GLUCOSE-CAPILLARY: 215 mg/dL — AB (ref 65–99)
Glucose-Capillary: 155 mg/dL — ABNORMAL HIGH (ref 65–99)
Glucose-Capillary: 215 mg/dL — ABNORMAL HIGH (ref 65–99)
Glucose-Capillary: 276 mg/dL — ABNORMAL HIGH (ref 65–99)

## 2015-06-17 LAB — BASIC METABOLIC PANEL
Anion gap: 11 (ref 5–15)
BUN: 33 mg/dL — AB (ref 6–20)
CHLORIDE: 96 mmol/L — AB (ref 101–111)
CO2: 32 mmol/L (ref 22–32)
CREATININE: 1.23 mg/dL (ref 0.61–1.24)
Calcium: 9 mg/dL (ref 8.9–10.3)
GFR calc Af Amer: >60 mL/min (ref >60)
GFR calc non Af Amer: >60 mL/min (ref >60)
GLUCOSE: 192 mg/dL — AB (ref 65–99)
Potassium: 3.8 mmol/L (ref 3.5–5.1)
Sodium: 139 mmol/L (ref 135–145)

## 2015-06-17 MED ORDER — COLCHICINE 0.6 MG PO TABS
0.6000 mg | ORAL_TABLET | Freq: Every day | ORAL | Status: DC
  Administered 2015-06-17 – 2015-06-21 (×5): 0.6 mg via ORAL
  Filled 2015-06-17 (×5): qty 1

## 2015-06-17 MED ORDER — ENOXAPARIN SODIUM 80 MG/0.8ML ~~LOC~~ SOLN
80.0000 mg | SUBCUTANEOUS | Status: DC
  Administered 2015-06-18 – 2015-06-21 (×4): 80 mg via SUBCUTANEOUS
  Filled 2015-06-17 (×4): qty 0.8

## 2015-06-17 MED ORDER — ASPIRIN 81 MG PO CHEW
81.0000 mg | CHEWABLE_TABLET | Freq: Every day | ORAL | Status: DC
  Administered 2015-06-17 – 2015-06-21 (×5): 81 mg via ORAL
  Filled 2015-06-17 (×5): qty 1

## 2015-06-17 MED ORDER — POTASSIUM CHLORIDE CRYS ER 20 MEQ PO TBCR
20.0000 meq | EXTENDED_RELEASE_TABLET | Freq: Two times a day (BID) | ORAL | Status: DC
  Administered 2015-06-17 – 2015-06-21 (×9): 20 meq via ORAL
  Filled 2015-06-17 (×15): qty 1

## 2015-06-17 NOTE — Progress Notes (Signed)
Advanced Heart Failure Rounding Note  Primary Cardiologist: McAlhany Primary HF: Shirlee Latch  Subjective:    64 yo male w/ PMH of severe aortic stenosis (s/p AVR 10/2011), T2DM, OSA, HLD, and chronic diastolic HR admitted on 06/14/15 for worsening dyspnea and weight gain (345lbs 04/16, 384lbs 05/30/15 and 397lbs on admission).   Feels like his LE swelling is improving. Able to walk short distances.  Uses a walker.  Walker replaced this admission as he had previously been using one too short for him. Says he does drink a lot of fluid.  He has been attempting to maintain a vegan diet.  Of note, when I spoke to him last night his dinner consisted of melons, tomatoes, and mandarin oranges.  He also had a cup of tea, coffee, two small water bottles, and a pitcher of water on his tray. He weighed 353 lbs in May, had been as high as 393 this admission.    Out 3.1 L yesterday.  Down 6 lbs.   Creatinine stable currently 1.26 -> 1.23   Objective:   Weight Range: 386 lb 9.6 oz (175.361 kg) Body mass index is 53.94 kg/(m^2).   Vital Signs:   Temp:  [98.1 F (36.7 C)-98.3 F (36.8 C)] 98.1 F (36.7 C) (08/19 0415) Pulse Rate:  [80-89] 80 (08/19 0415) Resp:  [19] 19 (08/19 0415) BP: (120-126)/(61-101) 126/101 mmHg (08/19 0415) SpO2:  [92 %-94 %] 92 % (08/19 0415) Weight:  [386 lb 9.6 oz (175.361 kg)] 386 lb 9.6 oz (175.361 kg) (08/19 0415) Last BM Date: 06/15/15  Weight change: Filed Weights   06/15/15 0557 06/16/15 0601 06/17/15 0415  Weight: 393 lb (178.264 kg) 392 lb 14.4 oz (178.218 kg) 386 lb 9.6 oz (175.361 kg)    Intake/Output:   Intake/Output Summary (Last 24 hours) at 06/17/15 0830 Last data filed at 06/17/15 0818  Gross per 24 hour  Intake    720 ml  Output   3950 ml  Net  -3230 ml     Physical Exam: General: Obese. Reclined in chair.  NAD. Head: Normal Neck: Supple without bruits, Very thick. JVD difficult to assess, but appears elevated. Lungs: CTA.  Heart: RRR, S1, S2,  no S3, S4, or murmur; no rub appreciated. Abdomen: Morbidly obese. Soft, non-tender, non-distended. +BS. No hepatomegaly. No rebound/guarding. No obvious abdominal masses. Extremities: No clubbing, no cyanosis, 2+ pitting edema bilaterally. Distal pedal pulses 2+ bilaterally. Neuro: Alert and oriented X 3. Moves all extremities spontaneously. Psych: Normal affect.  Telemetry: NSR 90s, Occasional PVCs  Labs: CBC  Recent Labs  06/14/15 1655 06/15/15 0025  WBC 12.2* 11.9*  HGB 12.0* 11.7*  HCT 38.0* 37.5*  MCV 80.0 80.6  PLT 292 251   Basic Metabolic Panel  Recent Labs  06/15/15 0025  06/16/15 0337 06/17/15 0351  NA 137  < > 139 139  K 3.6  < > 3.7 3.8  CL 99*  < > 99* 96*  CO2 30  < > 31 32  GLUCOSE 355*  < > 242* 192*  BUN 32*  < > 36* 33*  CALCIUM 8.8*  < > 8.6* 9.0  MG 1.7  --   --   --   < > = values in this interval not displayed. Liver Function Tests  Recent Labs  06/14/15 1955 06/15/15 0025  AST 26 23  ALT 23 22  ALKPHOS 116 105  BILITOT 0.4 0.7  PROT 7.2 6.6  ALBUMIN 3.2* 3.0*   No results for input(s): LIPASE, AMYLASE  in the last 72 hours. Cardiac Enzymes  Recent Labs  06/15/15 0025 06/15/15 0507 06/15/15 1135  TROPONINI <0.03 <0.03 <0.03    BNP: BNP (last 3 results)  Recent Labs  02/18/15 2116 06/14/15 1655  BNP 67.3 49.4    ProBNP (last 3 results) No results for input(s): PROBNP in the last 8760 hours.   D-Dimer  Recent Labs  06/15/15 1445  DDIMER 0.40   Hemoglobin A1C No results for input(s): HGBA1C in the last 72 hours. Fasting Lipid Panel No results for input(s): CHOL, HDL, LDLCALC, TRIG, CHOLHDL, LDLDIRECT in the last 72 hours. Thyroid Function Tests  Recent Labs  06/15/15 0025  TSH 5.293*    Other results:     Imaging/Studies:   No results found.  Latest Echo  Latest Cath   Medications:     Scheduled Medications: . atorvastatin  40 mg Oral q1800  . enoxaparin (LOVENOX) injection  40 mg  Subcutaneous Q24H  . febuxostat  40 mg Oral Daily  . fenofibrate  54 mg Oral Daily  . furosemide  100 mg Intravenous TID  . insulin aspart  0-20 Units Subcutaneous TID WC  . insulin glargine  60 Units Subcutaneous BID  . lisinopril  20 mg Oral Daily  . silver sulfADIAZINE  1 application Topical BID  . sodium chloride  3 mL Intravenous Q12H     Infusions:     PRN Medications:  sodium chloride, acetaminophen, HYDROcodone-acetaminophen, ondansetron (ZOFRAN) IV, sodium chloride, terbinafine, zolpidem   Assessment/Plan   1. Acute on chronic diastolic heart failure - Possible triggers include UTI, infection, ischemia, and thyroid disorder. - TSH elevated to 5.293 on 06/15/15, however T4 was 0.79 (WNL), so this points to subclinical hypothyroidism. Patient is without overt symptoms at this time. - Cyclic troponin values negative. D-dimer negative. - Currently on IV Lasix 100mg  TID. Net -7.2 L thus far. Continue aggressive IV diuresis. - Creatinine 1.23 on 06/17/15. Has been variable from 1.02 - 2.08 in the past year.  - Continue to monitor BMET with diuresis. - Continue Lisinopril 20mg  daily. - Discussed limiting his fluid intake to < 2L a day and limiting fruits with high water content such as tomatoes, oranges, and watermelon. 2. Severe AS  - s/p bioprosthetic AV 10/2011.  - Echo on 02/22/15 showed good/physiologic gradient. 3. Type 2 DM - Lantus 60U BID with sliding scale. 4. HLD - continue statin 5. Hypokalemia - Potassium 3.8 on 06/17/15.  -  Continue to monitor with BMET and replete as needed. 6. 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 Followed by a Podiatrist in the the outpatient setting. - He has had arterial doppler evaluation in the past that has not shown significant PAD.    Length of Stay: 3   [name] PA-C 06/17/2015, 8:30 AM  Advanced Heart Failure Team Pager [phone] (M-F; 7a - 4p)  Please contact [facility] for  night-coverage after hours (4p -7a ) and weekends on [email or web address]  Patient seen with PA, agree with the above note.   Mr [name] has had recurrent trouble with diastolic CHF.  Diuresis has been limited in the past by AKI.  This seems to have been related at times to excessive NSAID use and at times to over-diuresis (daily metolazone).    He is in with significant volume overload.  He is currently diuresing well with Lasix 100 mg IV every 8 hrs.  Creatinine is stable.  I will continue this dose of Lasix for today,  suspect he needs at least a couple more days diuresis.  At that point, we will need to get him on a stable outpatient regimen, likely with torsemide.   [name] 06/17/2015 9:41 AM

## 2015-06-17 NOTE — Care Management Important Message (Signed)
Important Message  Patient Details  Name: Antonio [name] MRN: [id number] Date of Birth: [birth year]   Medicare Important Message Given:  Yes-second notification given    Darrold Span, RN 06/17/2015, 10:26 AM

## 2015-06-17 NOTE — Progress Notes (Signed)
Patient requesting that his home dose of colchicine be resumed.  Provider on call text paged.  Patient denies any additional questions or concerns at this time, will continue to monitor.

## 2015-06-17 NOTE — Progress Notes (Signed)
Inpatient Diabetes Program Recommendations  AACE/ADA: New Consensus Statement on Inpatient Glycemic Control (2013)  Target Ranges:  Prepandial:   less than 140 mg/dL      Peak postprandial:   less than 180 mg/dL (1-2 hours)      Critically ill patients:  140 - 180 mg/dL   Inpatient Diabetes Program Recommendations Insulin - Meal Coverage: add Novolog 5 units TID with meals per glycemic control order-set for elevated postprandials  If meal coverage is added consider reducing correction scale to moderate. Thank you  Piedad Climes BSN, RN,CDE Inpatient Diabetes Coordinator [phone] (team pager)

## 2015-06-17 NOTE — Progress Notes (Deleted)
Patient Name: Antonio [name] Date of Encounter: 06/17/2015  Principal Problem:   Acute on chronic diastolic heart failure Active Problems:   Hyperlipidemia   HYPERTENSION, BENIGN SYSTEMIC   Primary Cardiologist: Dr. Clifton James Patient Profile: 64 yo male w/ PMH of severe aortic stenosis (s/p AVR 10/2011), T2DM, OSA, HLD, and chronic diastolic HR admitted on 06/14/15 for worsening dyspnea and weight gain (345lbs 04/16, 384lbs 05/30/15 and 397lbs on admission).   SUBJECTIVE: Ambulating better with his walker. Denies any chest pain or shortness of breath. Says he is going to continue to try to limit his fluid intake.   OBJECTIVE Filed Vitals:   06/15/15 1500 06/16/15 0601 06/16/15 1420 06/17/15 0415  BP: 118/57 131/62 120/61 126/101  Pulse: 89 81 89 80  Temp: 98.6 F (37 C) 98 F (36.7 C) 98.3 F (36.8 C) 98.1 F (36.7 C)  TempSrc: Oral Oral Oral Oral  Resp: 18  19 19   Height:      Weight:  392 lb 14.4 oz (178.218 kg)  386 lb 9.6 oz (175.361 kg)  SpO2: 94% 93% 94% 92%    Intake/Output Summary (Last 24 hours) at 06/17/15 1017 Last data filed at 06/17/15 0818  Gross per 24 hour  Intake    480 ml  Output   3950 ml  Net  -3470 ml   Filed Weights   06/15/15 0557 06/16/15 0601 06/17/15 0415  Weight: 393 lb (178.264 kg) 392 lb 14.4 oz (178.218 kg) 386 lb 9.6 oz (175.361 kg)    PHYSICAL EXAM General: Obese, male in no acute distress. Head: Normocephalic, atraumatic.  Neck: JVD not able to be assessed due to body habitus. Lungs:  Resp regular and unlabored, CTA wit. Heart: RRR, S1, S2, no S3, S4, or murmur; no rub. Abdomen: Soft, non-tender, non-distended with normoactive bowel sounds. No hepatomegaly. No rebound/guarding. No obvious abdominal masses. Extremities: No clubbing, cyanosis, 2+ pitting edema bilaterally. Distal pedal pulses are 2+ bilaterally. Neuro: Alert and oriented X 3. Moves all extremities spontaneously. Psych: Normal affect.   LABS: CBC: Recent  Labs  06/14/15 1655 06/15/15 0025  WBC 12.2* 11.9*  HGB 12.0* 11.7*  HCT 38.0* 37.5*  MCV 80.0 80.6  PLT 292 251   INR: Recent Labs  06/15/15 0025  INR 1.32   Basic Metabolic Panel: Recent Labs  06/15/15 0025  06/16/15 0337 06/17/15 0351  NA 137  < > 139 139  K 3.6  < > 3.7 3.8  CL 99*  < > 99* 96*  CO2 30  < > 31 32  GLUCOSE 355*  < > 242* 192*  BUN 32*  < > 36* 33*  CREATININE 1.38*  < > 1.26* 1.23  CALCIUM 8.8*  < > 8.6* 9.0  MG 1.7  --   --   --   < > = values in this interval not displayed. Liver Function Tests: Recent Labs  06/14/15 1955 06/15/15 0025  AST 26 23  ALT 23 22  ALKPHOS 116 105  BILITOT 0.4 0.7  PROT 7.2 6.6  ALBUMIN 3.2* 3.0*   Cardiac Enzymes: Recent Labs  06/15/15 0025 06/15/15 0507 06/15/15 1135  TROPONINI <0.03 <0.03 <0.03   BNP:  B NATRIURETIC PEPTIDE  Date/Time Value Ref Range Status  06/14/2015 04:55 PM 49.4 0.0 - 100.0 pg/mL Final  02/18/2015 09:16 PM 67.3 0.0 - 100.0 pg/mL Final   D-dimer: Recent Labs  06/15/15 1445  DDIMER 0.40   Thyroid Function Tests: Recent Labs  06/15/15 0025  TSH 5.293*    TELE:  NSR with rate in the 80's. Occasional PVC's.       Current Medications:  . aspirin  81 mg Oral Daily  . atorvastatin  40 mg Oral q1800  . enoxaparin (LOVENOX) injection  40 mg Subcutaneous Q24H  . febuxostat  40 mg Oral Daily  . fenofibrate  54 mg Oral Daily  . furosemide  100 mg Intravenous TID  . insulin aspart  0-20 Units Subcutaneous TID WC  . insulin glargine  60 Units Subcutaneous BID  . lisinopril  20 mg Oral Daily  . potassium chloride  20 mEq Oral BID  . silver sulfADIAZINE  1 application Topical BID  . sodium chloride  3 mL Intravenous Q12H      ASSESSMENT AND PLAN:  1. Acute on chronic diastolic heart failure - Possible triggers include UTI, infection, ischemia, and thyroid disorder. - TSH elevated to 5.293 on 06/15/15, however T4 was 0.79 (WNL), so this points to subclinical  hypothyroidism. Patient is without overt symptoms at this time. - Cyclic troponin values negative. D-dimer negative. - Was on IV Lasix  BID but was increased to Lasix  TID on 06/16/15 . Net -7.2 L thus far. Weight 386lbs on 06/17/15, down from 393lbs at admission. - Creatinine 1.23 on 06/16/15. Has been variable from 1.02 - 2.08 in the past year. Will need to continue to monitor BMET with diuresis. - Continue Lisinopril  daily. - HF Consult obtained. They recommend continuing with Lasix  IV every 8 hours for now, and anticipate a few more days of diuresis will be needed due to his volume overload. They recommend using Torsemide as he transitions to outpatient.  2. Severe AS  - s/p bioprosthetic AV 10/2011.  - Echo on 02/22/15 showed good/physiologic gradient.  3. Type 2 DM - Lantus 60U BID with sliding scale.  4. HLD - continue statin  5. Hypokalemia - Potassium 3.8 on 06/17/15.  - Will need to monitor with BMET and replete as needed.  6. Right Second Toe - patient says it has been discolored for quite some time due to recurrent blood blisters and he is followed by a Podiatrist in the outpatient setting.    Signed, Wandra Mannan , PA-C 10:17 AM 06/17/2015

## 2015-06-17 NOTE — Progress Notes (Signed)
Per heart failure team.

## 2015-06-18 LAB — BASIC METABOLIC PANEL
ANION GAP: 10 (ref 5–15)
BUN: 39 mg/dL — ABNORMAL HIGH (ref 6–20)
CALCIUM: 8.9 mg/dL (ref 8.9–10.3)
CO2: 32 mmol/L (ref 22–32)
Chloride: 96 mmol/L — ABNORMAL LOW (ref 101–111)
Creatinine, Ser: 1.33 mg/dL — ABNORMAL HIGH (ref 0.61–1.24)
GFR calc Af Amer: >60 mL/min (ref >60)
GFR calc non Af Amer: 55 mL/min — ABNORMAL LOW (ref >60)
GLUCOSE: 233 mg/dL — AB (ref 65–99)
Potassium: 4 mmol/L (ref 3.5–5.1)
Sodium: 138 mmol/L (ref 135–145)

## 2015-06-18 LAB — GLUCOSE, CAPILLARY
GLUCOSE-CAPILLARY: 226 mg/dL — AB (ref 65–99)
GLUCOSE-CAPILLARY: 224 mg/dL — AB (ref 65–99)
GLUCOSE-CAPILLARY: 208 mg/dL — AB (ref 65–99)
Glucose-Capillary: 192 mg/dL — ABNORMAL HIGH (ref 65–99)

## 2015-06-18 LAB — CBC WITH DIFFERENTIAL/PLATELET
BASOS PCT: 0 % (ref 0–1)
Basophils Absolute: 0 10*3/uL (ref 0.0–0.1)
Eosinophils Absolute: 0.3 10*3/uL (ref 0.0–0.7)
Eosinophils Relative: 2 % (ref 0–5)
HEMATOCRIT: 40.3 % (ref 39.0–52.0)
Hemoglobin: 12.5 g/dL — ABNORMAL LOW (ref 13.0–17.0)
LYMPHS ABS: 3.7 10*3/uL (ref 0.7–4.0)
Lymphocytes Relative: 27 % (ref 12–46)
MCH: 25.4 pg — AB (ref 26.0–34.0)
MCHC: 31 g/dL (ref 30.0–36.0)
MCV: 81.9 fL (ref 78.0–100.0)
MONO ABS: 1.1 10*3/uL — AB (ref 0.1–1.0)
MONOS PCT: 8 % (ref 3–12)
NEUTROS ABS: 8.8 10*3/uL — AB (ref 1.7–7.7)
Neutrophils Relative %: 63 % (ref 43–77)
Platelets: 265 10*3/uL (ref 150–400)
RBC: 4.92 MIL/uL (ref 4.22–5.81)
RDW: 14 % (ref 11.5–15.5)
WBC: 13.9 10*3/uL — ABNORMAL HIGH (ref 4.0–10.5)

## 2015-06-18 NOTE — Progress Notes (Signed)
    Subjective:  Denies CP; dyspnea improving   Objective:  Filed Vitals:   06/17/15 1354 06/17/15 2112 06/18/15 0400 06/18/15 1026  BP: 130/55 120/56 112/52 124/63  Pulse: 87 87 86 66  Temp: 98 F (36.7 C) 98.3 F (36.8 C) 98.4 F (36.9 C)   TempSrc: Oral Oral Oral   Resp: 18 19 17    Height:      Weight:   384 lb 3.2 oz (174.272 kg)   SpO2: 91% 92% 93%     Intake/Output from previous day:  Intake/Output Summary (Last 24 hours) at 06/18/15 1107 Last data filed at 06/18/15 1037  Gross per 24 hour  Intake   1203 ml  Output   3825 ml  Net  -2622 ml    Physical Exam: Physical exam: Well-developed obese in no acute distress.  Skin is warm and dry.  HEENT is normal.  Neck is supple.  Chest is clear to auscultation with normal expansion.  Cardiovascular exam is regular rate and rhythm.  Abdominal exam nontender or distended. No masses palpated. + presacral edema Extremities show 1+ edema. neuro grossly intact    Lab Results: Basic Metabolic Panel:  Recent Labs  90/21/11 0351 06/18/15 0314  NA 139 138  K 3.8 4.0  CL 96* 96*  CO2 32 32  GLUCOSE 192* 233*  BUN 33* 39*  CREATININE 1.23 1.33*  CALCIUM 9.0 8.9   CBC:  Recent Labs  06/18/15 0314  WBC 13.9*  NEUTROABS 8.8*  HGB 12.5*  HCT 40.3  MCV 81.9  PLT 265   Cardiac Enzymes:  Recent Labs  06/15/15 1135  TROPONINI <0.03     Assessment/Plan:  1. Acute on chronic diastolic heart failure - Currently on IV Lasix 100mg  TID. Net -9.9 L thus far. Continue aggressive IV diuresis. Follow renal function closely.  - Discussed limiting his fluid intake to < 2L a day -transition to Demadex in the next 24-48 hours. He would benefit from being followed in the CHF clinic long-term. 2. Severe AS  - s/p bioprosthetic AV 10/2011.  - Echo on 02/22/15 showed good/physiologic gradient. 3. Type 2 DM 4. HLD - continue statin 5. 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 Followed by a  Podiatrist in the the outpatient setting.  Olga Millers 06/18/2015, 11:07 AM

## 2015-06-19 LAB — BASIC METABOLIC PANEL
Anion gap: 13 (ref 5–15)
BUN: 47 mg/dL — ABNORMAL HIGH (ref 6–20)
CALCIUM: 9 mg/dL (ref 8.9–10.3)
CO2: 30 mmol/L (ref 22–32)
CREATININE: 1.33 mg/dL — AB (ref 0.61–1.24)
Chloride: 95 mmol/L — ABNORMAL LOW (ref 101–111)
GFR calc non Af Amer: 55 mL/min — ABNORMAL LOW (ref >60)
Glucose, Bld: 187 mg/dL — ABNORMAL HIGH (ref 65–99)
Potassium: 4.3 mmol/L (ref 3.5–5.1)
SODIUM: 138 mmol/L (ref 135–145)

## 2015-06-19 LAB — GLUCOSE, CAPILLARY
GLUCOSE-CAPILLARY: 164 mg/dL — AB (ref 65–99)
Glucose-Capillary: 253 mg/dL — ABNORMAL HIGH (ref 65–99)
Glucose-Capillary: 260 mg/dL — ABNORMAL HIGH (ref 65–99)
Glucose-Capillary: 204 mg/dL — ABNORMAL HIGH (ref 65–99)

## 2015-06-19 NOTE — Progress Notes (Signed)
    Subjective:  Denies CP; dyspnea improving   Objective:  Filed Vitals:   06/18/15 1026 06/18/15 1402 06/18/15 2114 06/19/15 0445  BP: 124/63 121/49 134/42 122/70  Pulse: 66 88 86 84  Temp:  97.8 F (36.6 C) 98 F (36.7 C) 98 F (36.7 C)  TempSrc:  Oral Oral Oral  Resp:  18 19 18   Height:      Weight:    380 lb 11.2 oz (172.684 kg)  SpO2:  92% 94% 92%    Intake/Output from previous day:  Intake/Output Summary (Last 24 hours) at 06/19/15 1001 Last data filed at 06/19/15 0449  Gross per 24 hour  Intake    640 ml  Output   3875 ml  Net  -3235 ml    Physical Exam: Physical exam: Well-developed obese in no acute distress.  Skin is warm and dry.  HEENT is normal.  Neck is supple.  Chest is clear to auscultation with normal expansion.  Cardiovascular exam is regular rate and rhythm.  Abdominal exam nontender or distended. No masses palpated. + presacral edema Extremities show 1+ edema. neuro grossly intact    Lab Results: Basic Metabolic Panel:  Recent Labs  49/44/96 0314 06/19/15 0420  NA 138 138  K 4.0 4.3  CL 96* 95*  CO2 32 30  GLUCOSE 233* 187*  BUN 39* 47*  CREATININE 1.33* 1.33*  CALCIUM 8.9 9.0   CBC:  Recent Labs  06/18/15 0314  WBC 13.9*  NEUTROABS 8.8*  HGB 12.5*  HCT 40.3  MCV 81.9  PLT 265     Assessment/Plan:  1. Acute on chronic diastolic heart failure - Currently on IV Lasix 100mg  TID. Net -12.3 L thus far. Weight 380. Continue IV diuresis today and reassess in AM. Follow renal function closely.  - Discussed limiting his fluid intake to < 2L a day -transition to Demadex in the next 24-48 hours. He would benefit from being followed in the CHF clinic long-term. 2. Severe AS  - s/p bioprosthetic AV 10/2011.  - Echo on 02/22/15 showed good/physiologic gradient. 3. Type 2 DM 4. HLD - continue statin 5. 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 Followed by a Podiatrist in the the outpatient  setting.  Olga Millers 06/19/2015, 10:01 AM

## 2015-06-20 ENCOUNTER — Encounter (HOSPITAL_COMMUNITY): Payer: Medicare Other

## 2015-06-20 LAB — GLUCOSE, CAPILLARY
GLUCOSE-CAPILLARY: 208 mg/dL — AB (ref 65–99)
GLUCOSE-CAPILLARY: 285 mg/dL — AB (ref 65–99)
Glucose-Capillary: 266 mg/dL — ABNORMAL HIGH (ref 65–99)
Glucose-Capillary: 342 mg/dL — ABNORMAL HIGH (ref 65–99)

## 2015-06-20 LAB — MAGNESIUM: Magnesium: 2.3 mg/dL (ref 1.7–2.4)

## 2015-06-20 LAB — BASIC METABOLIC PANEL
Anion gap: 11 (ref 5–15)
BUN: 55 mg/dL — AB (ref 6–20)
CHLORIDE: 94 mmol/L — AB (ref 101–111)
CO2: 31 mmol/L (ref 22–32)
CREATININE: 1.55 mg/dL — AB (ref 0.61–1.24)
Calcium: 8.8 mg/dL — ABNORMAL LOW (ref 8.9–10.3)
GFR calc Af Amer: 53 mL/min — ABNORMAL LOW (ref >60)
GFR calc non Af Amer: 46 mL/min — ABNORMAL LOW (ref >60)
Glucose, Bld: 241 mg/dL — ABNORMAL HIGH (ref 65–99)
POTASSIUM: 4.4 mmol/L (ref 3.5–5.1)
Sodium: 136 mmol/L (ref 135–145)

## 2015-06-20 MED ORDER — TORSEMIDE 20 MG PO TABS
80.0000 mg | ORAL_TABLET | Freq: Every day | ORAL | Status: DC
  Administered 2015-06-20 – 2015-06-21 (×2): 80 mg via ORAL
  Filled 2015-06-20 (×2): qty 4

## 2015-06-20 MED ORDER — PREDNISONE 20 MG PO TABS
20.0000 mg | ORAL_TABLET | Freq: Every day | ORAL | Status: DC
  Administered 2015-06-20 – 2015-06-21 (×2): 20 mg via ORAL
  Filled 2015-06-20 (×2): qty 1

## 2015-06-20 NOTE — Progress Notes (Signed)
Inpatient Diabetes Program Recommendations  AACE/ADA: New Consensus Statement on Inpatient Glycemic Control (2013)  Target Ranges:  Prepandial:   less than 140 mg/dL      Peak postprandial:   less than 180 mg/dL (1-2 hours)      Critically ill patients:  140 - 180 mg/dL   Results for DERELL, BENT (MRN [id number]) as of 06/20/2015 08:46  Ref. Range 06/19/2015 06:39 06/19/2015 11:22 06/19/2015 16:14 06/19/2015 21:48 06/20/2015 06:21  Glucose-Capillary Latest Ref Range: 65-99 mg/dL 409 (H) 811 (H) 914 (H) 204 (H) 266 (H)   Diabetes history: DM 2 Outpatient Diabetes medications: Lantus 60 units BID, Novolog 16 units QAM, 22 units QPM Current orders for Inpatient glycemic control: Lantus 60 units BID, Novolog Resistant  Inpatient Diabetes Program Recommendations Insulin - Basal: Fasting glucose is over 200. Please consider increasing basal insulin to 65 units BID. Insulin - Meal Coverage: Patient receiving Novolog 11 units with each meal. Patient takes at least 16 units of meal coverage at home. Please consider starting Novolog 5 units TID meal coverage in addition to correction.  Thanks,  Christena Deem RN, MSN, [facility] Inpatient Diabetes Coordinator Team Pager [phone] (8am-5pm)

## 2015-06-20 NOTE — Care Management Note (Signed)
Case Management Note Donn Pierini RN, BSN Unit 2W-Case Manager [phone]  Patient Details  Name: Antonio [name] MRN: [id number] Date of Birth: [birth year]  Subjective/Objective:    Pt admitted with acute on chronic heart failure                Action/Plan: PTA Pt lived at home- PT eval   Expected Discharge Date:                  Expected Discharge Plan:  Home w Home Health Services  In-House Referral:     Discharge planning Services  CM Consult  Post Acute Care Choice:    Choice offered to:     DME Arranged:    DME Agency:     HH Arranged:    HH Agency:     Status of Service:  In process, will continue to follow  Medicare Important Message Given:  Yes-third notification given Date Medicare IM Given:    Medicare IM give by:    Date Additional Medicare IM Given:    Additional Medicare Important Message give by:     If discussed at Long Length of Stay Meetings, dates discussed:  06/21/15  Additional Comments:  Darrold Span, RN 06/20/2015, 2:09 PM

## 2015-06-20 NOTE — Care Management Important Message (Signed)
Important Message  Patient Details  Name: Antonio [name] MRN: [id number] Date of Birth: 10-14-1951   Medicare Important Message Given:  Yes-third notification given    Darrold Span, RN 06/20/2015, 10:28 AM

## 2015-06-20 NOTE — Progress Notes (Signed)
Utilization review completed.  

## 2015-06-20 NOTE — Progress Notes (Signed)
Advanced Heart Failure Rounding Note  Primary Cardiologist: McAlhany Primary HF: Shirlee Latch  Subjective:    64 yo male w/ PMH of severe aortic stenosis (s/p AVR 10/2011), T2DM, OSA, HLD, and chronic diastolic HR admitted on 06/14/15 for worsening dyspnea and weight gain (345lbs 04/16, 384lbs 05/30/15 and 397lbs on admission).    Feeling good today.  Starting to have gout pain in L knee and foot from IV diuresis.  He is slightly anxious about going home in the next few days.  Out 2.5 L yesterday. Weight actually up one lb. Down 13 lbs from highest this admission. Creatinine trending up 1.26 -> 1.23 -> 1.33 -> 1.33 -> 1.55   Objective:   Weight Range: 381 lb 14.4 oz (173.229 kg) Body mass index is 53.29 kg/(m^2).   Vital Signs:   Temp:  [97.6 F (36.4 C)-98.7 F (37.1 C)] 97.6 F (36.4 C) (08/22 0400) Pulse Rate:  [83-90] 87 (08/22 0400) Resp:  [16-18] 16 (08/22 0400) BP: (109-120)/(51-60) 110/52 mmHg (08/22 0400) SpO2:  [92 %-95 %] 94 % (08/22 0400) Weight:  [381 lb 14.4 oz (173.229 kg)] 381 lb 14.4 oz (173.229 kg) (08/22 0400) Last BM Date: 06/11/15  Weight change: Filed Weights   06/18/15 0400 06/19/15 0445 06/20/15 0400  Weight: 384 lb 3.2 oz (174.272 kg) 380 lb 11.2 oz (172.684 kg) 381 lb 14.4 oz (173.229 kg)    Intake/Output:   Intake/Output Summary (Last 24 hours) at 06/20/15 0842 Last data filed at 06/20/15 2836  Gross per 24 hour  Intake    960 ml  Output   3550 ml  Net  -2590 ml     Physical Exam: General: Obese. Reclined in chair.  NAD. Head: Normal Neck: Supple without bruits, Very thick. JVD difficult to assess, but appears elevated. Lungs: CTA.  Heart: RRR, S1, S2, no S3, S4, or murmur; no rub appreciated. Abdomen: Morbidly obese. Soft, non-tender, non-distended. +BS. No hepatomegaly. No rebound/guarding. No obvious abdominal masses. Extremities: No clubbing, no cyanosis, 2+ pitting edema bilaterally. Distal pedal pulses 2+ bilaterally. Neuro: Alert  and oriented X 3. Moves all extremities spontaneously. Psych: Normal affect.  Telemetry: NSR 90s, Occasional PVCs  Labs: CBC  Recent Labs  06/18/15 0314  WBC 13.9*  NEUTROABS 8.8*  HGB 12.5*  HCT 40.3  MCV 81.9  PLT 265   Basic Metabolic Panel  Recent Labs  06/19/15 0420 06/20/15 0333  NA 138 136  K 4.3 4.4  CL 95* 94*  CO2 30 31  GLUCOSE 187* 241*  BUN 47* 55*  CALCIUM 9.0 8.8*   Liver Function Tests No results for input(s): AST, ALT, ALKPHOS, BILITOT, PROT, ALBUMIN in the last 72 hours. No results for input(s): LIPASE, AMYLASE in the last 72 hours. Cardiac Enzymes No results for input(s): CKTOTAL, CKMB, CKMBINDEX, TROPONINI in the last 72 hours.  BNP: BNP (last 3 results)  Recent Labs  02/18/15 2116 06/14/15 1655  BNP 67.3 49.4    ProBNP (last 3 results) No results for input(s): PROBNP in the last 8760 hours.   D-Dimer No results for input(s): DDIMER in the last 72 hours. Hemoglobin A1C No results for input(s): HGBA1C in the last 72 hours. Fasting Lipid Panel No results for input(s): CHOL, HDL, LDLCALC, TRIG, CHOLHDL, LDLDIRECT in the last 72 hours. Thyroid Function Tests No results for input(s): TSH, T4TOTAL, T3FREE, THYROIDAB in the last 72 hours.  Invalid input(s): FREET3  Other results:     Imaging/Studies:  No results found.  Latest Echo  Latest  Cath   Medications:     Scheduled Medications: . aspirin  81 mg Oral Daily  . atorvastatin  40 mg Oral q1800  . colchicine  0.6 mg Oral Daily  . enoxaparin (LOVENOX) injection  80 mg Subcutaneous Q24H  . febuxostat  40 mg Oral Daily  . fenofibrate  54 mg Oral Daily  . furosemide  100 mg Intravenous TID  . insulin aspart  0-20 Units Subcutaneous TID WC  . insulin glargine  60 Units Subcutaneous BID  . lisinopril  20 mg Oral Daily  . potassium chloride  20 mEq Oral BID  . silver sulfADIAZINE  1 application Topical BID  . sodium chloride  3 mL Intravenous Q12H    Infusions:     PRN Medications: sodium chloride, acetaminophen, HYDROcodone-acetaminophen, ondansetron (ZOFRAN) IV, sodium chloride, terbinafine, zolpidem   Assessment/Plan   1. Acute on chronic diastolic heart failure - Possible triggers include UTI, infection, ischemia, and thyroid disorder. - TSH elevated to 5.293 on 06/15/15, however T4 was 0.79 (WNL), so this points to subclinical hypothyroidism. Patient is without overt symptoms at this time. - Cyclic troponin values negative. D-dimer negative. - STOP IV lasix.  Switch to 80 mg Torsemide daily. - Creatinine 1.55 on 06/20/15. Has been variable from 1.02 - 2.08 in the past year.  - Continue to monitor BMET with diuresis. - Continue Lisinopril  daily. - Encouraged to continue limiting his fluid intake to < 2L a day and limiting fruits with high water content such as tomatoes, oranges, and watermelon. 2. Severe AS  - s/p bioprosthetic AV 10/2011.  - Echo on 02/22/15 normal valve function. 3. Type 2 DM - Lantus 60U BID with sliding scale. 4. HLD - continue statin 5. Hypokalemia - Potassium 4.4 on 06/20/15.  -  Continue to monitor with BMET and replete as needed. 6. 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 Followed by a Podiatrist in the the outpatient setting. - He has had arterial doppler evaluation in the past that has not shown significant PAD.  7. Gout - 20 mg prednisone x 3 days - Watch sugars closely.  Dispo: Monitor kidney function overnight with IV diuresis.  Likely home tomorrow.  Length of Stay: 6   [name] PA-C 06/20/2015, 8:42 AM  Advanced Heart Failure Team Pager [phone] (M-F; 7a - 4p)  Please contact [facility] for night-coverage after hours (4p -7a ) and weekends on [email or web address]  Patient seen with PA, agree with the above note.  Volume looks much better.  Creatinine slightly higher.  Will convert over to torsemide 80 mg daily.  Repeat BMET in am, likely home tomorrow.    Starting to get gout flare knee/foot.  He is on Uloric/colchicine already.  Will give prednisone x 3 days, follow blood glucose.   [name] 06/20/2015 10:35 AM

## 2015-06-21 LAB — BASIC METABOLIC PANEL
Anion gap: 9 (ref 5–15)
BUN: 54 mg/dL — AB (ref 6–20)
CALCIUM: 9.1 mg/dL (ref 8.9–10.3)
CO2: 32 mmol/L (ref 22–32)
CREATININE: 1.27 mg/dL — AB (ref 0.61–1.24)
Chloride: 96 mmol/L — ABNORMAL LOW (ref 101–111)
GFR calc Af Amer: >60 mL/min (ref >60)
GFR, EST NON AFRICAN AMERICAN: 58 mL/min — AB (ref >60)
GLUCOSE: 268 mg/dL — AB (ref 65–99)
POTASSIUM: 4.3 mmol/L (ref 3.5–5.1)
Sodium: 137 mmol/L (ref 135–145)

## 2015-06-21 LAB — GLUCOSE, CAPILLARY
GLUCOSE-CAPILLARY: 331 mg/dL — AB (ref 65–99)
Glucose-Capillary: 210 mg/dL — ABNORMAL HIGH (ref 65–99)
Glucose-Capillary: 289 mg/dL — ABNORMAL HIGH (ref 65–99)

## 2015-06-21 MED ORDER — TORSEMIDE 20 MG PO TABS: 80.0000 mg | ORAL_TABLET | Freq: Every day | ORAL | Status: DC

## 2015-06-21 MED ORDER — POTASSIUM CHLORIDE CRYS ER 20 MEQ PO TBCR: 20.0000 meq | EXTENDED_RELEASE_TABLET | Freq: Two times a day (BID) | ORAL | Status: DC

## 2015-06-21 MED ORDER — ASPIRIN 81 MG PO CHEW: 81.0000 mg | CHEWABLE_TABLET | Freq: Every day | ORAL | Status: DC

## 2015-06-21 MED ORDER — PREDNISONE 20 MG PO TABS: 20.0000 mg | ORAL_TABLET | Freq: Every day | ORAL | Status: DC

## 2015-06-21 NOTE — Progress Notes (Signed)
Pt discharge education and instructions completed with pt and he voices understanding denies any questions. Pt discharge home with friend to transport him home. Pt to pick up electronically sent prescriptions from preferred pharmacy on file. Pt transported off unit via wheelchair with belongings to the side. Arabella Merles Chesney Suares RN.

## 2015-06-21 NOTE — Progress Notes (Signed)
Advanced Heart Failure Rounding Note  Primary Cardiologist: McAlhany Primary HF: Shirlee Latch  Subjective:    64 yo male w/ PMH of severe aortic stenosis (s/p AVR 10/2011), T2DM, OSA, HLD, and chronic diastolic HR admitted on 06/14/15 for worsening dyspnea and weight gain (345lbs 04/16, 384lbs 05/30/15 and 397lbs on admission).    Feels ok, but says he still doesn't feel quite back to baseline despite weight loss of > 10 lbs this admission.  Would prefer to stay one day further on po diuretics and go home tomorrow.  L knee and foot better on prednisone.  Says he has been walking around the floor. Has 2 cups of coffee and water pitcher on his bedside table.  Out 1.4 L yesterday. Weight up one lb. Down 12 lbs from highest this admission. Creatinine improved 1.26 -> 1.23 -> 1.33 -> 1.33 -> 1.55 -> 1.27   Objective:   Weight Range: 382 lb 6.4 oz (173.456 kg) Body mass index is 53.36 kg/(m^2).   Vital Signs:   Temp:  [97.7 F (36.5 C)-98.2 F (36.8 C)] 97.7 F (36.5 C) (08/23 0528) Pulse Rate:  [83-92] 83 (08/23 0528) Resp:  [18] 18 (08/23 0528) BP: (113-126)/(53-61) 115/53 mmHg (08/23 0528) SpO2:  [91 %-95 %] 95 % (08/23 0528) Weight:  [382 lb 6.4 oz (173.456 kg)] 382 lb 6.4 oz (173.456 kg) (08/23 0528) Last BM Date: 06/20/15  Weight change: Filed Weights   06/19/15 0445 06/20/15 0400 06/21/15 0528  Weight: 380 lb 11.2 oz (172.684 kg) 381 lb 14.4 oz (173.229 kg) 382 lb 6.4 oz (173.456 kg)    Intake/Output:   Intake/Output Summary (Last 24 hours) at 06/21/15 0816 Last data filed at 06/21/15 0531  Gross per 24 hour  Intake   1080 ml  Output   2525 ml  Net  -1445 ml     Physical Exam: General: Obese. Reclined in chair.  NAD. Head: Normal Neck: Supple without bruits, Very thick. JVD difficult to assess Lungs: Clear.  Heart: RRR, S1, S2, no S3, S4, or murmur; no rub appreciated. Abdomen: Morbidly obese. Soft, NT, ND. +BS. No HSM. No rebound/guarding. No obvious abdominal  masses. Extremities: No clubbing, no cyanosis, 1+ pitting edema bilaterally, L>R. Distal pedal pulses 2+ bilaterally. Neuro: Alert and oriented X 3. Moves all extremities spontaneously. Psych: Normal affect.  Telemetry: NSR 80s, Occasional PVCs including some bigeminy  Labs: CBC No results for input(s): WBC, NEUTROABS, HGB, HCT, MCV, PLT in the last 72 hours. Basic Metabolic Panel  Recent Labs  06/20/15 0333 06/21/15 0510  NA 136 137  K 4.4 4.3  CL 94* 96*  CO2 31 32  GLUCOSE 241* 268*  BUN 55* 54*  CALCIUM 8.8* 9.1  MG 2.3  --    Liver Function Tests No results for input(s): AST, ALT, ALKPHOS, BILITOT, PROT, ALBUMIN in the last 72 hours. No results for input(s): LIPASE, AMYLASE in the last 72 hours. Cardiac Enzymes No results for input(s): CKTOTAL, CKMB, CKMBINDEX, TROPONINI in the last 72 hours.  BNP: BNP (last 3 results)  Recent Labs  02/18/15 2116 06/14/15 1655  BNP 67.3 49.4    ProBNP (last 3 results) No results for input(s): PROBNP in the last 8760 hours.   D-Dimer No results for input(s): DDIMER in the last 72 hours. Hemoglobin A1C No results for input(s): HGBA1C in the last 72 hours. Fasting Lipid Panel No results for input(s): CHOL, HDL, LDLCALC, TRIG, CHOLHDL, LDLDIRECT in the last 72 hours. Thyroid Function Tests No results for input(s):  TSH, T4TOTAL, T3FREE, THYROIDAB in the last 72 hours.  Invalid input(s): FREET3  Other results:     Imaging/Studies:  No results found.  Latest Echo  Latest Cath   Medications:     Scheduled Medications: . aspirin  81 mg Oral Daily  . atorvastatin  40 mg Oral q1800  . colchicine  0.6 mg Oral Daily  . enoxaparin (LOVENOX) injection  80 mg Subcutaneous Q24H  . febuxostat  40 mg Oral Daily  . fenofibrate  54 mg Oral Daily  . insulin aspart  0-20 Units Subcutaneous TID WC  . insulin glargine  60 Units Subcutaneous BID  . lisinopril  20 mg Oral Daily  . potassium chloride  20 mEq Oral BID  .  predniSONE  20 mg Oral Q breakfast  . silver sulfADIAZINE  1 application Topical BID  . sodium chloride  3 mL Intravenous Q12H  . torsemide  80 mg Oral Daily    Infusions:    PRN Medications: sodium chloride, acetaminophen, HYDROcodone-acetaminophen, ondansetron (ZOFRAN) IV, sodium chloride, terbinafine, zolpidem   Assessment/Plan   1. Acute on chronic diastolic heart failure - Possible triggers include UTI, infection, ischemia, and thyroid disorder. - TSH elevated to 5.293 on 06/15/15, however T4 was 0.79 (WNL), so this points to subclinical hypothyroidism. Patient is without overt symptoms at this time. - Cyclic troponin values negative. D-dimer negative. - Continue 80 mg Torsemide daily. Consider 2.5 mg of metolazone as needed for weight gain. - Creatinine 1.27 on 06/21/15. Has been variable from 1.02 - 2.08 in the past year.  - Continue to monitor BMET with diuresis. - Continue Lisinopril 20mg  daily. - Encouraged to continue limiting his fluid intake to < 2L a day and limiting fruits with high water content such as tomatoes, oranges, and watermelon. 2. Severe AS  - s/p bioprosthetic AV 10/2011.  - Echo on 02/22/15 normal valve function. 3. Type 2 DM - Lantus 60U BID with sliding scale. 4. HLD - continue statin 5. Hypokalemia - Potassium 4.3 on 06/21/15.  -  Continue to monitor with BMET and replete as needed. 6. 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 Followed by a Podiatrist in the the outpatient setting. - He has had arterial doppler evaluation in the past that has not shown significant PAD.  7. Gout - 20 mg prednisone x 3 days (8/22, 8/23, 8/24) - Watch sugars closely.  Dispo: OK for home today.  Length of Stay: 7   [name] PA-C 06/21/2015, 8:16 AM  Advanced Heart Failure Team Pager [phone] (M-F; 7a - 4p)  Please contact [facility] for night-coverage after hours (4p -7a ) and weekends on [email or web address]  Patient seen with  PA, agree with the above note.  He is doing well today.  Diuresed reasonably with torsemide.  Creatinine is lower.  I think that he can go home today.  Diuretic regimen will be torsemide 80 mg daily + KCl 20 bid. Continue other cardiac meds as currently ordered.   [name] 06/21/2015 11:42 AM

## 2015-06-21 NOTE — Discharge Summary (Signed)
Advanced Heart Failure Team  Discharge Summary   Patient ID: Antonio [name] MRN: [id number], DOB/AGE: Nov 13, 1950 64 y.o. Admit date: 06/14/2015 D/C date:     06/21/2015   Primary Discharge Diagnoses:  1. Acute on chronic diastolic heart failure 2. Severe AS  3. Type 2 DM 4. HLD 5. Hypokalemia 6. Right Second Toe Ulceration 7. Gout   Hospital Course:  Antonio [name] is a 64 y.o. male with a PMH of severe aortic stenosis (s/p AVR 10/2011), T2DM, OSA, HLD, and chronic diastolic HF. He was admitted on 06/14/15 for worsening dyspnea and weight gain (384lbs 05/30/15 and 397lbs on admission).  On admit CXR was limited but showed mild basilar atelectasis. BNP 49.4 despite significant fluid overload by exam with elevated JVD and 2-3+ peripheral edema. He had been on 80 mg lasix BID at home, and was unable to increase due to rise in creatinine. He noted decreased UO at home.   He had moderate diuresis on IV lasix 100 BID. This was increased to TID for several days and he was able to be transitioned to 80 mg torsemide daily prior to discharge. Overall he diuresed out 15 L on and lost 11 lbs.   The pt was counseled extensively on fluid restriction, salt restriction, and medication compliance with a recommendation of < 2L of fluid overall daily.  Home diuretic regimen will be torsemide 80 mg daily + KCl 20 bid with all other unchanged.  He will be discharged to home in stable condition with follow up at the HF clinic as below.    Discharge Weight Range: 382 Discharge Vitals: Blood pressure 112/63, pulse 78, temperature 97.8 F (36.6 C), temperature source Oral, resp. rate 18, height 5\' 11"  (1.803 m), weight 382 lb 6.4 oz (173.456 kg), SpO2 95 %.  Labs: Lab Results  Component Value Date   WBC 13.9* 06/18/2015   HGB 12.5* 06/18/2015   HCT 40.3 06/18/2015   MCV 81.9 06/18/2015   PLT 265 06/18/2015    Recent Labs Lab 06/15/15 0025  06/21/15 0510  NA 137  < > 137  K 3.6  < > 4.3  CL 99*  < >  96*  CO2 30  < > 32  BUN 32*  < > 54*  CREATININE 1.38*  < > 1.27*  CALCIUM 8.8*  < > 9.1  PROT 6.6  --   --   BILITOT 0.7  --   --   ALKPHOS 105  --   --   ALT 22  --   --   AST 23  --   --   GLUCOSE 355*  < > 268*  < > = values in this interval not displayed. Lab Results  Component Value Date   CHOL 112 11/27/2011   HDL 26* 11/27/2011   LDLCALC 54 11/27/2011   TRIG 161* 11/27/2011   BNP (last 3 results)  Recent Labs  02/18/15 2116 06/14/15 1655  BNP 67.3 49.4    ProBNP (last 3 results) No results for input(s): PROBNP in the last 8760 hours.   Diagnostic Studies/Procedures   No results found.  Discharge Medications     Medication List    STOP taking these medications        furosemide 40 MG tablet  Commonly known as:  LASIX     metolazone 2.5 MG tablet  Commonly known as:  ZAROXOLYN      TAKE these medications        aspirin 81 MG chewable  tablet  Chew 1 tablet (81 mg total) by mouth daily.     atorvastatin 40 MG tablet  Commonly known as:  LIPITOR  Take 40 mg by mouth daily at 6 PM.     BAYER CONTOUR NEXT TEST test strip  Generic drug:  glucose blood  1 each by Other route as needed. for testing     colchicine 0.6 MG tablet  Take 0.6 mg by mouth daily.     febuxostat 40 MG tablet  Commonly known as:  ULORIC  Take 40 mg by mouth daily. Patient taking two 40 mg tablets by mouth daily.     fenofibrate 54 MG tablet  Take 54 mg by mouth daily.     HYDROcodone-acetaminophen 7.5-325 MG per tablet  Commonly known as:  NORCO  Take 1 tablet by mouth 2 (two) times daily as needed for moderate pain.     insulin glargine 100 UNIT/ML injection  Commonly known as:  LANTUS  Inject 60 Units into the skin 2 (two) times daily. As directed     lisinopril 20 MG tablet  Commonly known as:  PRINIVIL,ZESTRIL  Take 1 tablet (20 mg total) by mouth daily.     NOVOLOG 100 UNIT/ML injection  Generic drug:  insulin aspart  Inject 0-26 Units into the skin 3  (three) times daily with meals. Sliding scale, <70 = 0, 70-90 = 6 units, 91-130 = 10 units (base dose), 131-150 = 12 units, 151-200 = 14 units, 201-250 = 16 units, 251-300 = 18 units, 301-350 = 20 units, 351-400 = 22 units, 401-450 = 24 units, >450 = 26 units     potassium chloride SA 20 MEQ tablet  Commonly known as:  K-DUR,KLOR-CON  Take 1 tablet (20 mEq total) by mouth 2 (two) times daily.     predniSONE 20 MG tablet  Commonly known as:  DELTASONE  Take 1 tablet (20 mg total) by mouth daily with breakfast.     SSD 1 % cream  Generic drug:  silver sulfADIAZINE  Apply 1 application topically 2 (two) times daily.     terbinafine 1 % cream  Commonly known as:  LAMISIL  Apply 1 application topically daily as needed (for fungus).     torsemide 20 MG tablet  Commonly known as:  DEMADEX  Take 4 tablets (80 mg total) by mouth daily.        Disposition   The patient will be discharged in stable condition to home. Discharge Instructions    ACE Inhibitor / ARB already ordered    Complete by:  As directed      Diet - low sodium heart healthy    Complete by:  As directed      Discharge instructions    Complete by:  As directed   Call the clinic at [phone] with worsening shortness of breath or if your weight increase 3 lbs over night, or 5 lbs in one week.   Limit fluid intake to LESS than 2 Liters (64 oz) daily.  Limit fluid intake.     Heart Failure patients record your daily weight using the same scale at the same time of day    Complete by:  As directed      Increase activity slowly    Complete by:  As directed           Follow-up Information    Follow up with [facility] On 06/29/2015.   Specialty:  Cardiology  Why:  at 1130 for post hospital labs.  follow up visit the following week.  Pt code for parking for August is 0008   Contact information:   [address] [id number] [place]  [address] [phone]      Follow up with [name], MD On 07/06/2015.   Specialty:  Cardiology   Why:  at 1200 for post hospital follow up. Please bring all of your medications to your visit.  The pt parking code for September is 0090.   Contact information:   [address] [place] [address] [phone]         Duration of Discharge Encounter: Greater than 35 minutes   Signed, [name] PA-C 06/21/2015, 12:16 PM

## 2015-06-29 ENCOUNTER — Other Ambulatory Visit (HOSPITAL_COMMUNITY): Payer: Medicare Other

## 2015-07-05 ENCOUNTER — Encounter (HOSPITAL_COMMUNITY): Payer: Medicare Other

## 2015-07-12 ENCOUNTER — Encounter (HOSPITAL_COMMUNITY): Payer: Medicare Other

## 2015-07-27 ENCOUNTER — Other Ambulatory Visit (HOSPITAL_COMMUNITY): Payer: Self-pay | Admitting: *Deleted

## 2015-07-27 ENCOUNTER — Telehealth (HOSPITAL_COMMUNITY): Payer: Self-pay | Admitting: Vascular Surgery

## 2015-07-27 ENCOUNTER — Other Ambulatory Visit (HOSPITAL_COMMUNITY): Payer: Medicare Other

## 2015-07-27 ENCOUNTER — Inpatient Hospital Stay (HOSPITAL_COMMUNITY): Admission: RE | Admit: 2015-07-27 | Payer: Medicare Other | Source: Ambulatory Visit

## 2015-07-27 MED ORDER — TORSEMIDE 20 MG PO TABS: 80.0000 mg | ORAL_TABLET | Freq: Every day | ORAL | Status: DC

## 2015-07-27 NOTE — Telephone Encounter (Signed)
Pt needs a refill of Torsemide sent to [facility]

## 2015-08-09 ENCOUNTER — Ambulatory Visit (HOSPITAL_COMMUNITY)
Admission: RE | Admit: 2015-08-09 | Discharge: 2015-08-09 | Disposition: A | Discharge status: Home / Self Care | Payer: Medicare Other | Source: Ambulatory Visit | Attending: Cardiology | Admitting: Cardiology

## 2015-08-09 ENCOUNTER — Encounter (HOSPITAL_COMMUNITY): Payer: Self-pay

## 2015-08-09 ENCOUNTER — Other Ambulatory Visit (HOSPITAL_COMMUNITY): Payer: Self-pay | Admitting: Student

## 2015-08-09 ENCOUNTER — Encounter (HOSPITAL_COMMUNITY): Payer: Self-pay | Admitting: *Deleted

## 2015-08-09 ENCOUNTER — Inpatient Hospital Stay (HOSPITAL_COMMUNITY)
Admission: AD | Admit: 2015-08-09 | Discharge: 2015-08-14 | DRG: 291 | Disposition: A | Discharge status: Home Health | Payer: Medicare Other | Source: Ambulatory Visit | Attending: Cardiology | Admitting: Cardiology

## 2015-08-09 VITALS — BP 128/88 | HR 101 | Wt >= 6400 oz

## 2015-08-09 DIAGNOSIS — Z23 Encounter for immunization: Secondary | ICD-10-CM

## 2015-08-09 DIAGNOSIS — I10 Essential (primary) hypertension: Secondary | ICD-10-CM | POA: Diagnosis present

## 2015-08-09 DIAGNOSIS — Z7982 Long term (current) use of aspirin: Secondary | ICD-10-CM

## 2015-08-09 DIAGNOSIS — N179 Acute kidney failure, unspecified: Secondary | ICD-10-CM | POA: Diagnosis present

## 2015-08-09 DIAGNOSIS — I359 Nonrheumatic aortic valve disorder, unspecified: Secondary | ICD-10-CM | POA: Diagnosis not present

## 2015-08-09 DIAGNOSIS — I5043 Acute on chronic combined systolic (congestive) and diastolic (congestive) heart failure: Secondary | ICD-10-CM

## 2015-08-09 DIAGNOSIS — I480 Paroxysmal atrial fibrillation: Secondary | ICD-10-CM | POA: Diagnosis present

## 2015-08-09 DIAGNOSIS — Z953 Presence of xenogenic heart valve: Secondary | ICD-10-CM

## 2015-08-09 DIAGNOSIS — Z8249 Family history of ischemic heart disease and other diseases of the circulatory system: Secondary | ICD-10-CM | POA: Diagnosis not present

## 2015-08-09 DIAGNOSIS — M109 Gout, unspecified: Secondary | ICD-10-CM | POA: Diagnosis present

## 2015-08-09 DIAGNOSIS — N183 Chronic kidney disease, stage 3 (moderate): Secondary | ICD-10-CM | POA: Diagnosis present

## 2015-08-09 DIAGNOSIS — E039 Hypothyroidism, unspecified: Secondary | ICD-10-CM | POA: Diagnosis present

## 2015-08-09 DIAGNOSIS — Z6841 Body Mass Index (BMI) 40.0 and over, adult: Secondary | ICD-10-CM | POA: Diagnosis not present

## 2015-08-09 DIAGNOSIS — I5023 Acute on chronic systolic (congestive) heart failure: Secondary | ICD-10-CM | POA: Diagnosis not present

## 2015-08-09 DIAGNOSIS — I350 Nonrheumatic aortic (valve) stenosis: Secondary | ICD-10-CM

## 2015-08-09 DIAGNOSIS — E785 Hyperlipidemia, unspecified: Secondary | ICD-10-CM | POA: Diagnosis not present

## 2015-08-09 DIAGNOSIS — I130 Hypertensive heart and chronic kidney disease with heart failure and stage 1 through stage 4 chronic kidney disease, or unspecified chronic kidney disease: Secondary | ICD-10-CM | POA: Diagnosis present

## 2015-08-09 DIAGNOSIS — E1122 Type 2 diabetes mellitus with diabetic chronic kidney disease: Secondary | ICD-10-CM | POA: Diagnosis present

## 2015-08-09 DIAGNOSIS — G4733 Obstructive sleep apnea (adult) (pediatric): Secondary | ICD-10-CM | POA: Diagnosis present

## 2015-08-09 DIAGNOSIS — Z794 Long term (current) use of insulin: Secondary | ICD-10-CM

## 2015-08-09 DIAGNOSIS — R0600 Dyspnea, unspecified: Secondary | ICD-10-CM | POA: Diagnosis present

## 2015-08-09 DIAGNOSIS — I5033 Acute on chronic diastolic (congestive) heart failure: Secondary | ICD-10-CM | POA: Diagnosis present

## 2015-08-09 DIAGNOSIS — E669 Obesity, unspecified: Secondary | ICD-10-CM | POA: Diagnosis present

## 2015-08-09 DIAGNOSIS — I509 Heart failure, unspecified: Secondary | ICD-10-CM | POA: Diagnosis not present

## 2015-08-09 LAB — GLUCOSE, CAPILLARY
GLUCOSE-CAPILLARY: 192 mg/dL — AB (ref 65–99)
Glucose-Capillary: 201 mg/dL — ABNORMAL HIGH (ref 65–99)

## 2015-08-09 LAB — COMPREHENSIVE METABOLIC PANEL
ALT: 27 U/L (ref 17–63)
AST: 32 U/L (ref 15–41)
Albumin: 3.4 g/dL — ABNORMAL LOW (ref 3.5–5.0)
Alkaline Phosphatase: 89 U/L (ref 38–126)
Anion gap: 11 (ref 5–15)
BILIRUBIN TOTAL: 0.5 mg/dL (ref 0.3–1.2)
BUN: 45 mg/dL — AB (ref 6–20)
CO2: 30 mmol/L (ref 22–32)
CREATININE: 1.51 mg/dL — AB (ref 0.61–1.24)
Calcium: 9.1 mg/dL (ref 8.9–10.3)
Chloride: 96 mmol/L — ABNORMAL LOW (ref 101–111)
GFR, EST AFRICAN AMERICAN: 55 mL/min — AB (ref >60)
GFR, EST NON AFRICAN AMERICAN: 47 mL/min — AB (ref >60)
Glucose, Bld: 232 mg/dL — ABNORMAL HIGH (ref 65–99)
POTASSIUM: 4.5 mmol/L (ref 3.5–5.1)
Sodium: 137 mmol/L (ref 135–145)
TOTAL PROTEIN: 7.1 g/dL (ref 6.5–8.1)

## 2015-08-09 LAB — CBC WITH DIFFERENTIAL/PLATELET
BASOS PCT: 0 %
Basophils Absolute: 0 10*3/uL (ref 0.0–0.1)
EOS ABS: 0.3 10*3/uL (ref 0.0–0.7)
Eosinophils Relative: 3 %
HCT: 39.1 % (ref 39.0–52.0)
HEMOGLOBIN: 11.7 g/dL — AB (ref 13.0–17.0)
Lymphocytes Relative: 33 %
Lymphs Abs: 3.1 10*3/uL (ref 0.7–4.0)
MCH: 24.2 pg — ABNORMAL LOW (ref 26.0–34.0)
MCHC: 29.9 g/dL — AB (ref 30.0–36.0)
MCV: 81 fL (ref 78.0–100.0)
MONOS PCT: 6 %
Monocytes Absolute: 0.6 10*3/uL (ref 0.1–1.0)
NEUTROS PCT: 58 %
Neutro Abs: 5.3 10*3/uL (ref 1.7–7.7)
Platelets: 278 10*3/uL (ref 150–400)
RBC: 4.83 MIL/uL (ref 4.22–5.81)
RDW: 14.4 % (ref 11.5–15.5)
WBC: 9.3 10*3/uL (ref 4.0–10.5)

## 2015-08-09 LAB — TROPONIN I: Troponin I: <0.03 ng/mL (ref <0.031)

## 2015-08-09 LAB — MRSA PCR SCREENING: MRSA by PCR: NEGATIVE

## 2015-08-09 LAB — MAGNESIUM: MAGNESIUM: 2 mg/dL (ref 1.7–2.4)

## 2015-08-09 LAB — PROTIME-INR
INR: 1.21 (ref 0.00–1.49)
PROTHROMBIN TIME: 15.5 s — AB (ref 11.6–15.2)

## 2015-08-09 LAB — BRAIN NATRIURETIC PEPTIDE: B NATRIURETIC PEPTIDE 5: 10.4 pg/mL (ref 0.0–100.0)

## 2015-08-09 MED ORDER — ENOXAPARIN SODIUM 40 MG/0.4ML ~~LOC~~ SOLN
40.0000 mg | SUBCUTANEOUS | Status: DC
  Administered 2015-08-09 – 2015-08-10 (×2): 40 mg via SUBCUTANEOUS
  Filled 2015-08-09 (×2): qty 0.4

## 2015-08-09 MED ORDER — POTASSIUM CHLORIDE CRYS ER 20 MEQ PO TBCR
20.0000 meq | EXTENDED_RELEASE_TABLET | Freq: Two times a day (BID) | ORAL | Status: DC
  Administered 2015-08-09 – 2015-08-10 (×3): 20 meq via ORAL
  Filled 2015-08-09 (×3): qty 1

## 2015-08-09 MED ORDER — HYDROCODONE-ACETAMINOPHEN 7.5-325 MG PO TABS
1.0000 | ORAL_TABLET | Freq: Two times a day (BID) | ORAL | Status: DC | PRN
  Administered 2015-08-10 – 2015-08-14 (×7): 1 via ORAL
  Filled 2015-08-09 (×7): qty 1

## 2015-08-09 MED ORDER — SODIUM CHLORIDE 0.9 % IJ SOLN
3.0000 mL | INTRAMUSCULAR | Status: DC | PRN
  Administered 2015-08-13: 3 mL via INTRAVENOUS
  Filled 2015-08-09: qty 3

## 2015-08-09 MED ORDER — ASPIRIN 81 MG PO CHEW
81.0000 mg | CHEWABLE_TABLET | Freq: Every day | ORAL | Status: DC
  Administered 2015-08-09 – 2015-08-14 (×6): 81 mg via ORAL
  Filled 2015-08-09 (×6): qty 1

## 2015-08-09 MED ORDER — SODIUM CHLORIDE 0.9 % IJ SOLN
3.0000 mL | Freq: Two times a day (BID) | INTRAMUSCULAR | Status: DC
  Administered 2015-08-10 – 2015-08-14 (×5): 3 mL via INTRAVENOUS

## 2015-08-09 MED ORDER — INSULIN ASPART 100 UNIT/ML ~~LOC~~ SOLN
0.0000 [IU] | Freq: Three times a day (TID) | SUBCUTANEOUS | Status: DC
  Administered 2015-08-09 – 2015-08-10 (×3): 16 [IU] via SUBCUTANEOUS
  Administered 2015-08-10 – 2015-08-12 (×6): 14 [IU] via SUBCUTANEOUS
  Administered 2015-08-12: 10 [IU] via SUBCUTANEOUS
  Administered 2015-08-13: 14 [IU] via SUBCUTANEOUS
  Administered 2015-08-13 (×2): 16 [IU] via SUBCUTANEOUS
  Administered 2015-08-14 (×2): 14 [IU] via SUBCUTANEOUS

## 2015-08-09 MED ORDER — ONDANSETRON HCL 4 MG/2ML IJ SOLN: 4.0000 mg | Freq: Four times a day (QID) | INTRAMUSCULAR | Status: DC | PRN

## 2015-08-09 MED ORDER — FUROSEMIDE 10 MG/ML IJ SOLN
40.0000 mg | Freq: Once | INTRAMUSCULAR | Status: AC
  Administered 2015-08-09: 40 mg via INTRAVENOUS
  Filled 2015-08-09: qty 4

## 2015-08-09 MED ORDER — LISINOPRIL 20 MG PO TABS
20.0000 mg | ORAL_TABLET | Freq: Every day | ORAL | Status: DC
  Administered 2015-08-09: 20 mg via ORAL
  Filled 2015-08-09 (×2): qty 1

## 2015-08-09 MED ORDER — INSULIN ASPART 100 UNIT/ML ~~LOC~~ SOLN: 0.0000 [IU] | Freq: Three times a day (TID) | SUBCUTANEOUS | Status: DC

## 2015-08-09 MED ORDER — FUROSEMIDE 10 MG/ML IJ SOLN
15.0000 mg/h | INTRAVENOUS | Status: DC
  Administered 2015-08-09 – 2015-08-10 (×2): 10 mg/h via INTRAVENOUS
  Administered 2015-08-12 – 2015-08-13 (×2): 15 mg/h via INTRAVENOUS
  Filled 2015-08-09 (×9): qty 25

## 2015-08-09 MED ORDER — FEBUXOSTAT 40 MG PO TABS
40.0000 mg | ORAL_TABLET | Freq: Every day | ORAL | Status: DC
  Administered 2015-08-09 – 2015-08-14 (×6): 40 mg via ORAL
  Filled 2015-08-09 (×6): qty 1

## 2015-08-09 MED ORDER — INSULIN GLARGINE 100 UNIT/ML ~~LOC~~ SOLN
60.0000 [IU] | Freq: Two times a day (BID) | SUBCUTANEOUS | Status: DC
  Administered 2015-08-09 – 2015-08-14 (×10): 60 [IU] via SUBCUTANEOUS
  Filled 2015-08-09 (×12): qty 0.6

## 2015-08-09 MED ORDER — INFLUENZA VAC SPLIT QUAD 0.5 ML IM SUSY
0.5000 mL | PREFILLED_SYRINGE | INTRAMUSCULAR | Status: AC
  Administered 2015-08-13: 0.5 mL via INTRAMUSCULAR
  Filled 2015-08-09: qty 0.5

## 2015-08-09 MED ORDER — SODIUM CHLORIDE 0.9 % IV SOLN: 250.0000 mL | INTRAVENOUS | Status: DC | PRN

## 2015-08-09 MED ORDER — ZOLPIDEM TARTRATE 5 MG PO TABS
5.0000 mg | ORAL_TABLET | Freq: Once | ORAL | Status: AC
  Administered 2015-08-09: 5 mg via ORAL
  Filled 2015-08-09: qty 1

## 2015-08-09 MED ORDER — ACETAMINOPHEN 325 MG PO TABS
650.0000 mg | ORAL_TABLET | ORAL | Status: DC | PRN
  Administered 2015-08-12 – 2015-08-14 (×3): 650 mg via ORAL
  Filled 2015-08-09 (×4): qty 2

## 2015-08-09 MED ORDER — ATORVASTATIN CALCIUM 40 MG PO TABS
40.0000 mg | ORAL_TABLET | Freq: Every day | ORAL | Status: DC
  Administered 2015-08-09 – 2015-08-13 (×5): 40 mg via ORAL
  Filled 2015-08-09 (×5): qty 1

## 2015-08-09 MED ORDER — FENOFIBRATE 54 MG PO TABS
54.0000 mg | ORAL_TABLET | Freq: Every day | ORAL | Status: DC
  Administered 2015-08-10 – 2015-08-14 (×5): 54 mg via ORAL
  Filled 2015-08-09 (×6): qty 1

## 2015-08-09 NOTE — Addendum Note (Signed)
Encounter addended by: Elaina Pattee, RN on: 08/09/2015  1:04 PM<BR>     Documentation filed: Visit Diagnoses, Dx Association, Orders

## 2015-08-09 NOTE — Addendum Note (Signed)
Encounter addended by: [name], MD on: 08/09/2015 12:54 PM<BR>     Documentation filed: Charges VN, Visit Diagnoses

## 2015-08-09 NOTE — Progress Notes (Signed)
IV placed Right Forearm  20 gauze Pt tolerated well Saline locked

## 2015-08-09 NOTE — Progress Notes (Signed)
Notified [facility] [place] re: pt currently refusing PICC line after PICC RN explained risks and benefits. Pt states "wants to sleep on it and have a dialogue with the doctor in the morning" . [employer]

## 2015-08-09 NOTE — H&P (Signed)
Advanced Heart failure History and Physical Note     Advanced Heart Failure Clinic Note   Patient ID: IHAN MCWHIRTER, male DOB: July 01, 1951, 64 y.o. MRN: [id number]  Primary Cardiologist: Dr Alyson Ingles Primary HF: Dr Shirlee Latch  HPI: DYMOND EYE is a 64 y.o. male with a PMH of severe aortic stenosis (s/p AVR 10/2011), T2DM, OSA, HLD, and chronic diastolic HF with Echo 01/2015 LVEF 55% with mild to moderate RV dysfunction.  He was admitted on 06/14/15 for worsening dyspnea and weight gain (384lbs 05/30/15 and 397lbs on admission). He diuresed 15 L and down 11 lbs on IV lasix up to 100 mg TID. Discharge weight 382.  He returns today for post hospital follow up. He canceled his initial follow up as he was on a road trip. He is up 22 lbs from discharge weight. Drinks much more than 2L daily. Feels like he pees constantly. Has SOB with even the smallest activity and occasionally at rest. No palpitations. No lightheadedness or dizziness. Can't lie flat, sleeps in reclined position. Is not very active. Sits in a recliner for the majority of the day. He states he has been taking all of his medication as directed.  PMH 1. Chronic diastolic CHF:  - Echo 01/2015 LVEF 55% with mild to moderate RV dysfunction. 2. Severe aortic stenosis: The valve per report was trileaflet prior to AVR. s/p bioprosthetic AVR 10/2011.  3. T2DM 4. OSA 5. HLD  6. Gout 7. CKD 8. Subclinical hypothyroidism 9. Cardiac cath 1/12 with normal coronaries.  10. Chronic LBBB   Current Outpatient Prescriptions  Medication Sig Dispense Refill  . aspirin 81 MG chewable tablet Chew 1 tablet (81 mg total) by mouth daily. 30 tablet   . atorvastatin (LIPITOR) 40 MG tablet Take 40 mg by mouth daily at 6 PM.     . colchicine 0.6 MG tablet Take 0.6 mg by mouth daily.     . febuxostat (ULORIC) 40 MG tablet Take 40 mg by mouth daily.     . fenofibrate 54 MG tablet Take 54 mg by mouth daily.  0  .  HYDROcodone-acetaminophen (NORCO) 7.5-325 MG per tablet Take 1 tablet by mouth 2 (two) times daily as needed for moderate pain.     Marland Kitchen insulin glargine (LANTUS) 100 UNIT/ML injection Inject 60 Units into the skin 2 (two) times daily. As directed    . lisinopril (PRINIVIL,ZESTRIL) 20 MG tablet Take 1 tablet (20 mg total) by mouth daily. 30 tablet 11  . NOVOLOG 100 UNIT/ML injection Inject 0-26 Units into the skin 3 (three) times daily with meals. Sliding scale, <70 = 0, 70-90 = 6 units, 91-130 = 10 units (base dose), 131-150 = 12 units, 151-200 = 14 units, 201-250 = 16 units, 251-300 = 18 units, 301-350 = 20 units, 351-400 = 22 units, 401-450 = 24 units, >450 = 26 units  0  . potassium chloride SA (K-DUR,KLOR-CON) 20 MEQ tablet Take 1 tablet (20 mEq total) by mouth 2 (two) times daily. 60 tablet 6  . SSD 1 % cream Apply 1 application topically 2 (two) times daily.   0  . torsemide (DEMADEX) 20 MG tablet Take 4 tablets (80 mg total) by mouth daily. 120 tablet 6  . BAYER CONTOUR NEXT TEST test strip 1 each by Other route as needed. for testing  1   No current facility-administered medications for this encounter.    Allergies  Allergen Reactions  . Other Other (See Comments)    Salt causes "  extreme" edema.     Social History   Social History  . Marital Status: Divorced    Spouse Name: N/A  . Number of Children: N/A  . Years of Education: N/A   Occupational History  . retired travelling Engineer, site    Social History Main Topics  . Smoking status: Never Smoker   . Smokeless tobacco: Never Used  . Alcohol Use: Yes     Comment: 02/18/2015 "once or twice/yr I'll have a cocktail" rare  . Drug Use: No  . Sexual Activity: Not Currently   Other Topics Concern  . Not on file   Social History Narrative     Family History  Problem Relation Age of Onset  . Heart  attack Father   . Cancer Mother     Ceasar Mons Vitals:   08/09/15 1150  BP: 128/88  Pulse: 101  Weight: 406 lb (184.16 kg)  SpO2: 93%    PHYSICAL EXAM: General: Morbidly obese. HEENT: normal Neck: supple. Thick, JVD to ear. Carotids 2+ bilat; no bruits. No lymphadenopathy or thyromegaly Cor: PMI nondisplaced. Distant heart sounds. Regular rate & rhythm. No rubs, gallops. 2/6 SEM RUSB.  Lungs: Diminished BS at bases bilaterally.  Abdomen: severely obese, nontender, nondistended. No hepatosplenomegaly. No bruits or masses. +BS Extremities: no cyanosis, clubbing, rash. 2-3+ edema into legs. Neuro: alert & oriented x 3, cranial nerves grossly intact. moves all 4 extremities w/o difficulty. Affect flat.  ASSESSMENT & PLAN: 1. Acute on chronic diastolic CHF:  - Pt is again significantly volume overloaded with +22 lbs up from his discharge weight. He is 10 lbs higher than his previous admission weight. - Will admit to [facility] ICU for aggressive IV diuresis with lasix gtt. Will insert PICC line for CVP monitoring and better assessment of fluid status. - Currently on 80 mg Torsemide daily at home. Will need to increase on discharge. - He states that he pees constantly throughout the day, but drinks nearly as much as he puts out.  - Discussed importance of limiting fluid intake and watching sodium intake.  2. Severe AS s/p AVR 10/2011 - Stable on echo 01/2015  Baldwin Crown" [place], [place] 08/09/2015 12:20 PM   Patient seen with PA, agree with the above note.  1. Acute on chronic diastolic CHF: Weight up considerably, very volume overloaded. Exam difficult due to thick neck. He is short of breath with any exertion and says that he is having a hard time at home. I think we will have to admit him for IV diuresis.  - Lasix 40 mg IV x 1 then 10 mg/hr infusion.  - Follow BMET closely.  - Will need higher home dose of diuretic.  - Will place PICC to monitor  fluid status more easily (CVPs).  2. S/p bioprosthetic AVR: Stable on 4/16 echo.  3. CKD: Will get BMET, follow closely with diuresis.   [name] 08/09/2015 12:53 PM

## 2015-08-09 NOTE — Progress Notes (Signed)
Advanced Heart Failure Clinic Note   Patient ID: Antonio [name], male   DOB: 12/17/1950, 64 y.o.   MRN: [id number]  Primary Cardiologist: Dr Alyson Ingles Primary HF: Dr Shirlee Latch  HPI: Antonio [name] is a 64 y.o. male with a PMH of severe aortic stenosis (s/p AVR 10/2011), T2DM, OSA, HLD, and chronic diastolic HF with Echo 01/2015 LVEF 55% with mild to moderate RV dysfunction.  He was admitted on 06/14/15 for worsening dyspnea and weight gain (384lbs 05/30/15 and 397lbs on admission). He diuresed 15 L and down 11 lbs on IV lasix up to 100 mg TID. Discharge weight 382.  He returns today for post hospital follow up.  He canceled his initial follow up as he was on a road trip. He is up 22 lbs from discharge weight. Drinks much more than 2L daily. Feels like he pees constantly. Has SOB with even the smallest activity and occasionally at rest. No palpitations.  No lightheadedness or dizziness. Can't lie flat, sleeps in reclined position. Is not very active. Sits in a recliner for the majority of the day. He states he has been taking all of his medication as directed.  PMH 1. Chronic diastolic CHF:   - Echo 01/2015 LVEF 55% with mild to moderate RV dysfunction. 2. Severe aortic stenosis: The valve per report was trileaflet prior to AVR.  s/p bioprosthetic AVR 10/2011.  3. T2DM 4. OSA 5. HLD  6. Gout 7. CKD 8. Subclinical hypothyroidism 9. Cardiac cath 1/12 with normal coronaries.  10. Chronic LBBB   Current Outpatient Prescriptions  Medication Sig Dispense Refill  . aspirin 81 MG chewable tablet Chew 1 tablet (81 mg total) by mouth daily. 30 tablet   . atorvastatin (LIPITOR) 40 MG tablet Take 40 mg by mouth daily at 6 PM.     . colchicine 0.6 MG tablet Take 0.6 mg by mouth daily.     . febuxostat (ULORIC) 40 MG tablet Take 40 mg by mouth daily.     . fenofibrate 54 MG tablet Take 54 mg by mouth daily.  0  . HYDROcodone-acetaminophen (NORCO) 7.5-325 MG per tablet Take 1 tablet by mouth 2 (two)  times daily as needed for moderate pain.     Marland Kitchen insulin glargine (LANTUS) 100 UNIT/ML injection Inject 60 Units into the skin 2 (two) times daily. As directed    . lisinopril (PRINIVIL,ZESTRIL) 20 MG tablet Take 1 tablet (20 mg total) by mouth daily. 30 tablet 11  . NOVOLOG 100 UNIT/ML injection Inject 0-26 Units into the skin 3 (three) times daily with meals. Sliding scale, <70 = 0, 70-90 = 6 units, 91-130 = 10 units (base dose), 131-150 = 12 units, 151-200 = 14 units, 201-250 = 16 units, 251-300 = 18 units, 301-350 = 20 units, 351-400 = 22 units, 401-450 = 24 units, >450 = 26 units  0  . potassium chloride SA (K-DUR,KLOR-CON) 20 MEQ tablet Take 1 tablet (20 mEq total) by mouth 2 (two) times daily. 60 tablet 6  . SSD 1 % cream Apply 1 application topically 2 (two) times daily.   0  . torsemide (DEMADEX) 20 MG tablet Take 4 tablets (80 mg total) by mouth daily. 120 tablet 6  . BAYER CONTOUR NEXT TEST test strip 1 each by Other route as needed. for testing  1   No current facility-administered medications for this encounter.    Allergies  Allergen Reactions  . Other Other (See Comments)    Salt causes "extreme" edema.  Social History   Social History  . Marital Status: Divorced    Spouse Name: N/A  . Number of Children: N/A  . Years of Education: N/A   Occupational History  . retired travelling Engineer, site    Social History Main Topics  . Smoking status: Never Smoker   . Smokeless tobacco: Never Used  . Alcohol Use: Yes     Comment: 02/18/2015 "once or twice/yr I'll have a cocktail"      rare  . Drug Use: No  . Sexual Activity: Not Currently   Other Topics Concern  . Not on file   Social History Narrative      Family History  Problem Relation Age of Onset  . Heart attack Father   . Cancer Mother     Ceasar Mons Vitals:   08/09/15 1150  BP: 128/88  Pulse: 101  Weight: 406 lb (184.16 kg)  SpO2: 93%    PHYSICAL EXAM: General:  Morbidly obese. HEENT:  normal Neck: supple. Thick, JVD to ear. Carotids 2+ bilat; no bruits. No lymphadenopathy or thyromegaly Cor: PMI nondisplaced. Distant heart sounds. Regular rate & rhythm. No rubs, gallops.  2/6 SEM RUSB.  Lungs: Diminished BS at bases bilaterally.  Abdomen: severely obese, nontender, nondistended. No hepatosplenomegaly. No bruits or masses. +BS Extremities: no cyanosis, clubbing, rash. 2-3+ edema into legs. Neuro: alert & oriented x 3, cranial nerves grossly intact. moves all 4 extremities w/o difficulty. Affect flat.  ASSESSMENT & PLAN: 1. Acute on chronic diastolic CHF:  - Pt is again significantly volume overloaded with +22 lbs up from his discharge weight.  He is 10 lbs higher than his previous admission weight. - Will admit to [facility] ICU for aggressive IV diuresis with lasix gtt.  Will insert PICC line for CVP monitoring and better assessment of fluid status. - Currently on 80 mg Torsemide daily at home.  Will need to increase on discharge. - He states that he pees constantly throughout the day, but drinks nearly as much as he puts out.  - Discussed importance of limiting fluid intake and watching sodium intake.  2. Severe AS s/p AVR 10/2011 - Stable on echo 01/2015  Baldwin Crown" [place], [place] 08/09/2015 12:20 PM   Patient seen with PA, agree with the above note.   1. Acute on chronic diastolic CHF: Weight up considerably, very volume overloaded.  Exam difficult due to thick neck.  He is short of breath with any exertion and says that he is having a hard time at home.  I think we will have to admit him for IV diuresis.  - Lasix 40 mg IV x 1 then 10 mg/hr infusion.  - Follow BMET closely.  - Will need higher home dose of diuretic.  - Will place PICC to monitor fluid status more easily (CVPs).  2. S/p bioprosthetic AVR: Stable on 4/16 echo.  3. CKD: Will get BMET, follow closely with diuresis.   [name] 08/09/2015 12:53 PM

## 2015-08-09 NOTE — Progress Notes (Signed)
At bedside to obtain consent for PICC.  Once patient was given all information regarding procedure, risk and benefit, patient states that he is unsure about having the procedure done due to the risk.  He also states that he feels like this may be more invasive than necessary.  Patient states that he would like to think on it over night and speak with MD in am.  Vernona Rieger, RN at bedside during discussion.  At this time, [facility] will wait for MD to discuss with patient and have patient's ok to proceed.  We will be available to do PICC once this occurs.  Thank You,  Markeise Mathews, Lajean Manes, RN [facility]

## 2015-08-09 NOTE — Addendum Note (Signed)
Encounter addended by: Elaina Pattee, RN on: 08/09/2015  1:01 PM<BR>     Documentation filed: Notes Section

## 2015-08-10 ENCOUNTER — Inpatient Hospital Stay (HOSPITAL_COMMUNITY): Payer: Medicare Other

## 2015-08-10 ENCOUNTER — Other Ambulatory Visit (HOSPITAL_COMMUNITY): Payer: Medicare Other

## 2015-08-10 DIAGNOSIS — I5033 Acute on chronic diastolic (congestive) heart failure: Secondary | ICD-10-CM

## 2015-08-10 DIAGNOSIS — N183 Chronic kidney disease, stage 3 (moderate): Secondary | ICD-10-CM

## 2015-08-10 LAB — BASIC METABOLIC PANEL
ANION GAP: 13 (ref 5–15)
BUN: 49 mg/dL — ABNORMAL HIGH (ref 6–20)
CHLORIDE: 93 mmol/L — AB (ref 101–111)
CO2: 32 mmol/L (ref 22–32)
Calcium: 9.1 mg/dL (ref 8.9–10.3)
Creatinine, Ser: 1.64 mg/dL — ABNORMAL HIGH (ref 0.61–1.24)
GFR calc non Af Amer: 43 mL/min — ABNORMAL LOW (ref >60)
GFR, EST AFRICAN AMERICAN: 49 mL/min — AB (ref >60)
Glucose, Bld: 216 mg/dL — ABNORMAL HIGH (ref 65–99)
POTASSIUM: 3.8 mmol/L (ref 3.5–5.1)
SODIUM: 138 mmol/L (ref 135–145)

## 2015-08-10 LAB — GLUCOSE, CAPILLARY
GLUCOSE-CAPILLARY: 168 mg/dL — AB (ref 65–99)
GLUCOSE-CAPILLARY: 154 mg/dL — AB (ref 65–99)
Glucose-Capillary: 206 mg/dL — ABNORMAL HIGH (ref 65–99)
Glucose-Capillary: 247 mg/dL — ABNORMAL HIGH (ref 65–99)

## 2015-08-10 LAB — TROPONIN I: Troponin I: <0.03 ng/mL (ref <0.031)

## 2015-08-10 MED ORDER — METOLAZONE 2.5 MG PO TABS
2.5000 mg | ORAL_TABLET | Freq: Once | ORAL | Status: AC
  Administered 2015-08-10: 2.5 mg via ORAL
  Filled 2015-08-10: qty 1

## 2015-08-10 MED ORDER — POTASSIUM CHLORIDE CRYS ER 20 MEQ PO TBCR
20.0000 meq | EXTENDED_RELEASE_TABLET | Freq: Once | ORAL | Status: AC
  Administered 2015-08-10: 20 meq via ORAL
  Filled 2015-08-10: qty 1

## 2015-08-10 NOTE — Clinical Documentation Improvement (Signed)
Cardiology  Please update your documentation within the medical record to reflect your response to this query. Thank you  Can the diagnosis of CKD be further specified?   CKD Stage I - GFR greater than or equal to 90  CKD Stage II - GFR 60-89  CKD Stage III - GFR 30-59  CKD Stage IV - GFR 15-29  CKD Stage V - GFR < 15  ESRD (End Stage Renal Disease)  Other condition  Unable to clinically determine  Supporting Information: : (risk factors, signs and symptoms, diagnostics, treatment) 08/10/15 prog note..."3. CKD: Creatinine 1.5 => 1.6.- Hold lisinopril, BP not elevated.- Follow closely with diuresis"...  Please exercise your independent, professional judgment when responding. A specific answer is not anticipated or expected.  Thank You, Toribio Harbour, RN, BSN, CCDS Certified Clinical Documentation Specialist [place]: Health Information Management [phone]

## 2015-08-10 NOTE — Progress Notes (Signed)
Utilization review completed. Peola Joynt, RN, BSN. 

## 2015-08-10 NOTE — Progress Notes (Signed)
Advanced Heart Failure Rounding Note  Primary Cardiologist: Dr Alyson Ingles Primary HF: Dr Shirlee Latch  Subjective:    Refuses PICC line. Says he has had many relatives and "close friends" die or get ill from invasive procedures, and does not want it if it is not absolutely essential.  Feels slightly better, but still SOB with little activity.  Out 1.3 L and down 3 lbs with 40 mg IV lasix once and 10 mg/hr lasix gtt.  Objective:   Weight Range: 402 lb 8 oz (182.573 kg) Body mass index is 56.16 kg/(m^2).   Vital Signs:   Temp:  [97.5 F (36.4 C)-98.4 F (36.9 C)] 97.8 F (36.6 C) (10/12 0513) Pulse Rate:  [93-101] 93 (10/11 1726) Resp:  [20] 20 (10/11 1726) BP: (93-147)/(49-79) 147/62 mmHg (10/12 0515) SpO2:  [92 %-95 %] 95 % (10/12 0513) Weight:  [402 lb 8 oz (182.573 kg)-405 lb 10.3 oz (184 kg)] 402 lb 8 oz (182.573 kg) (10/12 0513) Last BM Date: 08/08/15  Weight change: Filed Weights   08/09/15 1412 08/09/15 1500 08/10/15 0513  Weight: 405 lb 10.3 oz (184 kg) 405 lb 10.3 oz (184 kg) 402 lb 8 oz (182.573 kg)    Intake/Output:   Intake/Output Summary (Last 24 hours) at 08/10/15 0732 Last data filed at 08/10/15 0700  Gross per 24 hour  Intake  364.5 ml  Output   1700 ml  Net -1335.5 ml     Physical Exam: General: Morbidly obese. HEENT: normal Neck: supple. Thick, JVP difficult to assess with pt girth but suspect elevated. Carotids 2+ bilat; no bruits. No thyromegaly or nodule noted Cor: PMI nondisplaced. Distant heart sounds. RRR. No rubs, gallops. 2/6 SEM RUSB.  Lungs: Diminished BS Abdomen: severely obese, NT, ND. No HSM. No bruits or masses. +BS Extremities: no cyanosis, clubbing, rash. 2-3+ woody edema into thighs. Neuro: alert & oriented x 3, cranial nerves grossly intact. moves all 4 extremities w/o difficulty. Affect flat  Telemetry: NSR 90s.  Frequent PVCs, including Trigeminy. Several 3 beat episodes of NSVT.   Labs: CBC  Recent Labs  08/09/15 1300   WBC 9.3  NEUTROABS 5.3  HGB 11.7*  HCT 39.1  MCV 81.0  PLT 278   Basic Metabolic Panel  Recent Labs  08/09/15 1300 08/10/15 0223  NA 137 138  K 4.5 3.8  CL 96* 93*  CO2 30 32  GLUCOSE 232* 216*  BUN 45* 49*  CREATININE 1.51* 1.64*  CALCIUM 9.1 9.1  MG 2.0  --    Liver Function Tests  Recent Labs  08/09/15 1300  AST 32  ALT 27  ALKPHOS 89  BILITOT 0.5  PROT 7.1  ALBUMIN 3.4*   No results for input(s): LIPASE, AMYLASE in the last 72 hours. Cardiac Enzymes  Recent Labs  08/09/15 1300 08/09/15 2033 08/10/15 0223  TROPONINI <0.03 <0.03 <0.03    BNP: BNP (last 3 results)  Recent Labs  02/18/15 2116 06/14/15 1655 08/09/15 1300  BNP 67.3 49.4 10.4    ProBNP (last 3 results) No results for input(s): PROBNP in the last 8760 hours.   D-Dimer No results for input(s): DDIMER in the last 72 hours. Hemoglobin A1C No results for input(s): HGBA1C in the last 72 hours. Fasting Lipid Panel No results for input(s): CHOL, HDL, LDLCALC, TRIG, CHOLHDL, LDLDIRECT in the last 72 hours. Thyroid Function Tests No results for input(s): TSH, T4TOTAL, T3FREE, THYROIDAB in the last 72 hours.  Invalid input(s): FREET3  Other results:     Imaging/Studies:  Dg Chest Port 1 View  08/10/2015  CLINICAL DATA:  CHF. EXAM: PORTABLE CHEST 1 VIEW COMPARISON:  06/14/2015. FINDINGS: Prior median sternotomy. Cardiomegaly with mild pulmonary vascular prominence and interstitial prominence consistent mild congestive heart failure. Bibasilar subsegmental atelectasis and/or infiltrates. No pleural effusion or pneumothorax. IMPRESSION: 1. Prior median sternotomy. Cardiomegaly. No pulmonary venous congestion. 2. Persistent bibasilar atelectasis . Electronically Signed   By: Maisie Fus  Register   On: 08/10/2015 07:17     Latest Echo  Latest Cath   Medications:     Scheduled Medications: . aspirin  81 mg Oral Daily  . atorvastatin  40 mg Oral q1800  . enoxaparin (LOVENOX)  injection  40 mg Subcutaneous Q24H  . febuxostat  40 mg Oral Daily  . fenofibrate  54 mg Oral Daily  . Influenza vac split quadrivalent PF  0.5 mL Intramuscular Tomorrow-1000  . insulin aspart  0-26 Units Subcutaneous TID WC  . insulin glargine  60 Units Subcutaneous BID  . lisinopril  20 mg Oral Daily  . potassium chloride SA  20 mEq Oral BID  . sodium chloride  3 mL Intravenous Q12H     Infusions: . furosemide (LASIX) infusion 10 mg/hr (08/09/15 1533)     PRN Medications:  sodium chloride, acetaminophen, HYDROcodone-acetaminophen, ondansetron (ZOFRAN) IV, sodium chloride   Assessment/Plan  RAYDER CREESE is a 64 y.o. male with a PMH of severe aortic stenosis (s/p AVR 10/2011), T2DM, OSA, HLD, and chronic diastolic HF with Echo 01/2015 LVEF 55% with mild to moderate RV dysfunction admitted from the HF clinic on 08/09/15 with 22 lbs weight gain since discharge in 05/2015  1. Acute on chronic diastolic CHF:  - Continue lasix gtt 10 mg/hr . Will Add 2.5 mg metolazone with extra 20 of potassium. - Supp K as needed to keep > 4.  - Refuses PICC. Can likely transfer to tele.  2. S/p bioprosthetic AVR:  - Stable on 4/16 echo 3. CKD: Creatinine 1.5 => 1.6.   - Hold lisinopril, BP not elevated. - Follow closely with diuresis  Length of Stay: 1   [name] PA-C 08/10/2015, 7:32 AM   Patient seen with PA, agree with the above note. He is volume overloaded but exam is somewhat difficult to follow for volume due to body habitus.  Needs ongoing diuresis, add dose of metolazone today.  He refuses PICC (had wanted to try to get better assessment of volume status).    Stop lisinopril, BP on the lower side and creatinine elevated.   Transfer to telemetry since we are not going to be monitoring CVP.   [name] 08/10/2015 8:35 AM   Advanced Heart Failure Team Pager [phone] (M-F; 7a - 4p)  Please contact [facility] for night-coverage after hours (4p -7a ) and  weekends on [email or web address]

## 2015-08-10 NOTE — Progress Notes (Signed)
Pt arrived to unit via chair from unit 2H, to room 3E05. Pt placed on telemetry by covering RN and called to central monitoring. Pt oriented to room and call bell and numbers to call staff for assist/needs.

## 2015-08-11 ENCOUNTER — Inpatient Hospital Stay (HOSPITAL_COMMUNITY): Payer: Medicare Other

## 2015-08-11 DIAGNOSIS — I509 Heart failure, unspecified: Secondary | ICD-10-CM

## 2015-08-11 LAB — GLUCOSE, CAPILLARY
GLUCOSE-CAPILLARY: 189 mg/dL — AB (ref 65–99)
GLUCOSE-CAPILLARY: 158 mg/dL — AB (ref 65–99)
GLUCOSE-CAPILLARY: 278 mg/dL — AB (ref 65–99)
Glucose-Capillary: 156 mg/dL — ABNORMAL HIGH (ref 65–99)

## 2015-08-11 LAB — BASIC METABOLIC PANEL
Anion gap: 10 (ref 5–15)
BUN: 49 mg/dL — ABNORMAL HIGH (ref 6–20)
CO2: 33 mmol/L — ABNORMAL HIGH (ref 22–32)
CREATININE: 1.62 mg/dL — AB (ref 0.61–1.24)
Calcium: 8.9 mg/dL (ref 8.9–10.3)
Chloride: 92 mmol/L — ABNORMAL LOW (ref 101–111)
GFR, EST AFRICAN AMERICAN: 50 mL/min — AB (ref >60)
GFR, EST NON AFRICAN AMERICAN: 43 mL/min — AB (ref >60)
Glucose, Bld: 159 mg/dL — ABNORMAL HIGH (ref 65–99)
POTASSIUM: 3.5 mmol/L (ref 3.5–5.1)
SODIUM: 135 mmol/L (ref 135–145)

## 2015-08-11 MED ORDER — METOLAZONE 2.5 MG PO TABS
2.5000 mg | ORAL_TABLET | Freq: Once | ORAL | Status: AC
  Administered 2015-08-11: 2.5 mg via ORAL
  Filled 2015-08-11: qty 1

## 2015-08-11 MED ORDER — POTASSIUM CHLORIDE CRYS ER 20 MEQ PO TBCR
40.0000 meq | EXTENDED_RELEASE_TABLET | Freq: Two times a day (BID) | ORAL | Status: DC
  Administered 2015-08-11 – 2015-08-14 (×7): 40 meq via ORAL
  Filled 2015-08-11 (×4): qty 2
  Filled 2015-08-11: qty 4
  Filled 2015-08-11 (×2): qty 2

## 2015-08-11 MED ORDER — ENOXAPARIN SODIUM 100 MG/ML ~~LOC~~ SOLN
90.0000 mg | SUBCUTANEOUS | Status: DC
  Administered 2015-08-11 – 2015-08-13 (×3): 90 mg via SUBCUTANEOUS
  Filled 2015-08-11 (×3): qty 1

## 2015-08-11 NOTE — Consult Note (Signed)
   [facility] CM Inpatient Consult   08/11/2015  DELSHAWN STECH 1950-12-16 [id number] Patient was assessed for [place] [facility] for community services. Patient was previously active with [place] [facility] earlier this yeart.  Met with patient at bedside regarding being restarted with [facility] services.arranged by inpatient case management or social work.  Patient states that he is currently with [employer] for his primary care provider of Dr. Jefm Petty.  Explained to the patient that he may be a part of the [place].  This Probation officer will follow up to check which registry he may be affiliated with. For additional questions or referrals please contact: Natividad Brood, RN BSN [facility] Liaison  [phone] business mobile phone

## 2015-08-11 NOTE — Progress Notes (Signed)
Advanced Heart Failure Rounding Note  Primary Cardiologist: Dr Alyson Ingles Primary HF: Dr Shirlee Latch  Subjective:    Feels better today.  Still very swollen. Asks about ultrasound (echo) today.  Explained need for regular assessment of heart function, especially with decompensations.    Out 1.3 L and down 6 lbs with 10 mg/hr lasix gtt and 2.5 mg metolazone  Objective:   Weight Range: 397 lb 14.4 oz (180.486 kg) Body mass index is 55.52 kg/(m^2).   Vital Signs:   Temp:  [97.6 F (36.4 C)-98 F (36.7 C)] 97.6 F (36.4 C) (10/13 0629) Pulse Rate:  [87-96] 91 (10/13 0629) Resp:  [18-20] 18 (10/13 0629) BP: (100-126)/(38-73) 100/72 mmHg (10/13 0629) SpO2:  [92 %-95 %] 92 % (10/13 0629) Weight:  [397 lb 14.4 oz (180.486 kg)-403 lb 1.6 oz (182.845 kg)] 397 lb 14.4 oz (180.486 kg) (10/13 0629) Last BM Date: 08/10/15  Weight change: Filed Weights   08/10/15 1443 08/11/15 0500 08/11/15 0629  Weight: 403 lb 1.6 oz (182.845 kg) 397 lb 14.4 oz (180.486 kg) 397 lb 14.4 oz (180.486 kg)    Intake/Output:   Intake/Output Summary (Last 24 hours) at 08/11/15 0821 Last data filed at 08/11/15 0442  Gross per 24 hour  Intake   1020 ml  Output   2380 ml  Net  -1360 ml     Physical Exam: General: Morbidly obese, in bed. HEENT: normal Neck: supple. Thick, JVP difficult to assess with pt girth but suspect elevated. Carotids 2+ bilat; no bruits. No thyromegaly or nodule Cor: PMI nondisplaced. Distant heart sounds. RRR. No rubs, gallops. 2/6 SEM RUSB.  Lungs: Diminished Abdomen: severely obese, NT, ND. No HSM. No bruits or masses. +BS Extremities: no cyanosis, clubbing, rash. 1-2+ woody edema into thighs. Neuro: alert & oriented x 3, cranial nerves grossly intact. moves all 4 extremities w/o difficulty. Affect pleasant  Telemetry: NSR 80s. Occasional PVCs   Labs: CBC  Recent Labs  08/09/15 1300  WBC 9.3  NEUTROABS 5.3  HGB 11.7*  HCT 39.1  MCV 81.0  PLT 278   Basic Metabolic  Panel  Recent Labs  16/10/96 1300 08/10/15 0223 08/11/15 0350  NA 137 138 135  K 4.5 3.8 3.5  CL 96* 93* 92*  CO2 30 32 33*  GLUCOSE 232* 216* 159*  BUN 45* 49* 49*  CREATININE 1.51* 1.64* 1.62*  CALCIUM 9.1 9.1 8.9  MG 2.0  --   --    Liver Function Tests  Recent Labs  08/09/15 1300  AST 32  ALT 27  ALKPHOS 89  BILITOT 0.5  PROT 7.1  ALBUMIN 3.4*   No results for input(s): LIPASE, AMYLASE in the last 72 hours. Cardiac Enzymes  Recent Labs  08/09/15 1300 08/09/15 2033 08/10/15 0223  TROPONINI <0.03 <0.03 <0.03    BNP: BNP (last 3 results)  Recent Labs  02/18/15 2116 06/14/15 1655 08/09/15 1300  BNP 67.3 49.4 10.4    ProBNP (last 3 results) No results for input(s): PROBNP in the last 8760 hours.   D-Dimer No results for input(s): DDIMER in the last 72 hours. Hemoglobin A1C No results for input(s): HGBA1C in the last 72 hours. Fasting Lipid Panel No results for input(s): CHOL, HDL, LDLCALC, TRIG, CHOLHDL, LDLDIRECT in the last 72 hours. Thyroid Function Tests No results for input(s): TSH, T4TOTAL, T3FREE, THYROIDAB in the last 72 hours.  Invalid input(s): FREET3  Other results:     Imaging/Studies:  Dg Chest Port 1 View  08/10/2015  CLINICAL  DATA:  CHF. EXAM: PORTABLE CHEST 1 VIEW COMPARISON:  06/14/2015. FINDINGS: Prior median sternotomy. Cardiomegaly with mild pulmonary vascular prominence and interstitial prominence consistent mild congestive heart failure. Bibasilar subsegmental atelectasis and/or infiltrates. No pleural effusion or pneumothorax. IMPRESSION: 1. Prior median sternotomy. Cardiomegaly. No pulmonary venous congestion. 2. Persistent bibasilar atelectasis . Electronically Signed   By: Maisie Fus  Register   On: 08/10/2015 07:17    Latest Echo  Latest Cath   Medications:     Scheduled Medications: . aspirin  81 mg Oral Daily  . atorvastatin  40 mg Oral q1800  . enoxaparin (LOVENOX) injection  90 mg Subcutaneous Q24H  .  febuxostat  40 mg Oral Daily  . fenofibrate  54 mg Oral Daily  . Influenza vac split quadrivalent PF  0.5 mL Intramuscular Tomorrow-1000  . insulin aspart  0-26 Units Subcutaneous TID WC  . insulin glargine  60 Units Subcutaneous BID  . potassium chloride SA  20 mEq Oral BID  . sodium chloride  3 mL Intravenous Q12H    Infusions: . furosemide (LASIX) infusion 10 mg/hr (08/10/15 1329)    PRN Medications: sodium chloride, acetaminophen, HYDROcodone-acetaminophen, ondansetron (ZOFRAN) IV, sodium chloride   Assessment/Plan  Antonio [name] is a 64 y.o. male with a PMH of severe aortic stenosis (s/p AVR 10/2011), T2DM, OSA, HLD, and chronic diastolic HF with Echo 01/2015 LVEF 55% with mild to moderate RV dysfunction admitted from the HF clinic on 08/09/15 with 22 lbs weight gain since discharge in 05/2015  1. Acute on chronic diastolic CHF:  - Remains volume overloaded. - Continue lasix gtt 10 mg/hr . Will give 2.5 mg metolazone again today. - Increase supp K to keep > 4.  - Refused PICC 2. S/p bioprosthetic AVR:  - Stable on 4/16 echo 3. AKI on CKD stage 3:  - Hold lisinopril - Follow closely with diuresis  Length of Stay: 2  [name] PA-C 08/11/2015, 8:21 AM   Advanced Heart Failure Team Pager [phone] (M-F; 7a - 4p)  Please contact [facility] for night-coverage after hours (4p -7a ) and weekends on [email or web address]  Patient seen with PA, agree with the above note.  Weight is down though I/Os not particularly negative.  Creatinine remains stable.  Difficult exam for volume but still appears overloaded.  Still well above prior "dry weight."  - Will increase Lasix gtt to 15 mg/hr and give metolazone 2.5 x 1.   - Getting K repletion. - Echo pending.   [name] 08/11/2015 9:34 AM

## 2015-08-11 NOTE — Progress Notes (Signed)
*  PRELIMINARY RESULTS* Echocardiogram 2D Echocardiogram has been performed.  Jeryl Columbia 08/11/2015, 11:03 AM

## 2015-08-11 NOTE — Evaluation (Signed)
Occupational Therapy Evaluation and Discharge Patient Details Name: GRESHAM MARIC MRN: [id number] DOB: Feb 06, 1951 Today's Date: 08/11/2015    History of Present Illness HERON CORFIELD is a 64 y.o. male with a PMH of severe aortic stenosis (s/p AVR 10/2011), T2DM, OSA, HLD, and chronic diastolic HF with Echo 01/2015 LVEF 55% with mild to moderate RV dysfunction. Found to have acute on chronic CHF   Clinical Impression   This 64 yo male admitted with above presents to acute OT at a Mod I level. No further OT needs, we will sign off.    Follow Up Recommendations  No OT follow up    Equipment Recommendations  3 in 1 bedside comode;Other (comment) (wide and wide RW)       Precautions / Restrictions Precautions Precautions: None Restrictions Weight Bearing Restrictions: No      Mobility Bed Mobility               General bed mobility comments: Pt up in recliner upon my arrival  Transfers Overall transfer level: Modified independent                    Balance Overall balance assessment: Modified Independent;Needs assistance Sitting-balance support: Feet supported;No upper extremity supported Sitting balance-Leahy Scale: Good     Standing balance support: No upper extremity supported Standing balance-Leahy Scale: Fair                              ADL Overall ADL's : Modified independent                                             Vision Additional Comments: No change from baseline          Pertinent Vitals/Pain Pain Assessment: 0-10 Pain Score: 5  Pain Location: left leg (sharp shooting at times from a pinched nerve), all joints aching due to increased fluid Pain Intervention(s): Limited activity within patient's tolerance     Hand Dominance Right   Extremity/Trunk Assessment Upper Extremity Assessment Upper Extremity Assessment: Overall WFL for tasks assessed   Lower Extremity Assessment Lower Extremity  Assessment: Defer to PT evaluation       Communication Communication Communication: No difficulties   Cognition Arousal/Alertness: Awake/alert Behavior During Therapy: WFL for tasks assessed/performed Overall Cognitive Status: Within Functional Limits for tasks assessed                                Home Living Family/patient expects to be discharged to:: Private residence Living Arrangements: Alone Available Help at Discharge: Family;Available PRN/intermittently Type of Home: House Home Access: Stairs to enter [employer] of Steps: 3 Entrance Stairs-Rails: Right Home Layout: One level     Bathroom Shower/Tub: Walk-in shower;Tub/shower unit;Curtain Shower/tub characteristics: Engineer, building services: Handicapped height     Home Equipment: reacher, only wears slip on shoes   Additional Comments: He was suppose to get a RW and 3n1 last admission but did not receive (I spoke to CM on floor to let her know that pt definitely needs them this admission)      Prior Functioning/Environment Level of Independence: Independent with assistive device(s)        Comments: Pt reports he drives and uses the motorized cart at the grocery  store. He does not cook and mostly has meals delivered.     OT Diagnosis: Generalized weakness         OT Goals(Current goals can be found in the care plan section) Acute Rehab OT Goals Patient Stated Goal: to get fluid off of me  OT Frequency:                End of Session Equipment Utilized During Treatment:  (none)  Activity Tolerance: Patient tolerated treatment well Patient left: in chair;with call bell/phone within reach   Time: 1334-1402 OT Time Calculation (min): 28 min Charges:  OT General Charges $OT Visit: 1 Procedure OT Evaluation $Initial OT Evaluation Tier I: 1 Procedure OT Treatments $Self Care/Home Management : 8-22 mins  Evette Georges [phone] 08/11/2015, 2:18 PM

## 2015-08-12 LAB — BASIC METABOLIC PANEL
Anion gap: 14 (ref 5–15)
BUN: 57 mg/dL — AB (ref 6–20)
CALCIUM: 9.1 mg/dL (ref 8.9–10.3)
CO2: 32 mmol/L (ref 22–32)
Chloride: 88 mmol/L — ABNORMAL LOW (ref 101–111)
Creatinine, Ser: 1.8 mg/dL — ABNORMAL HIGH (ref 0.61–1.24)
GFR calc Af Amer: 44 mL/min — ABNORMAL LOW (ref >60)
GFR, EST NON AFRICAN AMERICAN: 38 mL/min — AB (ref >60)
GLUCOSE: 191 mg/dL — AB (ref 65–99)
Potassium: 3.8 mmol/L (ref 3.5–5.1)
Sodium: 134 mmol/L — ABNORMAL LOW (ref 135–145)

## 2015-08-12 LAB — GLUCOSE, CAPILLARY
Glucose-Capillary: 173 mg/dL — ABNORMAL HIGH (ref 65–99)
Glucose-Capillary: 177 mg/dL — ABNORMAL HIGH (ref 65–99)
Glucose-Capillary: 110 mg/dL — ABNORMAL HIGH (ref 65–99)
Glucose-Capillary: 221 mg/dL — ABNORMAL HIGH (ref 65–99)

## 2015-08-12 LAB — MAGNESIUM: Magnesium: 2.4 mg/dL (ref 1.7–2.4)

## 2015-08-12 MED ORDER — INSULIN ASPART 100 UNIT/ML ~~LOC~~ SOLN: 0.0000 [IU] | Freq: Three times a day (TID) | SUBCUTANEOUS | Status: DC

## 2015-08-12 MED ORDER — INSULIN ASPART 100 UNIT/ML ~~LOC~~ SOLN
0.0000 [IU] | SUBCUTANEOUS | Status: DC
  Administered 2015-08-12 (×2): 4 [IU] via SUBCUTANEOUS

## 2015-08-12 MED ORDER — ZOLPIDEM TARTRATE 5 MG PO TABS
5.0000 mg | ORAL_TABLET | Freq: Every evening | ORAL | Status: DC | PRN
  Administered 2015-08-12: 5 mg via ORAL
  Filled 2015-08-12: qty 1

## 2015-08-12 NOTE — Care Management Important Message (Signed)
Important Message  Patient Details  Name: Antonio [name] MRN: [id number] Date of Birth: 01-03-1951   Medicare Important Message Given:  Yes-second notification given    Orson Aloe 08/12/2015, 10:58 AM

## 2015-08-12 NOTE — Progress Notes (Signed)
Rolling walker and 3:1 ordered and to be delivered to room; [employer] [phone]

## 2015-08-12 NOTE — Progress Notes (Signed)
Informed by monitor tech at 0740 that patient having prolonged QtTC./ asymptomatic. Mariam Dollar, PA-C of [facility] notified.

## 2015-08-12 NOTE — Evaluation (Signed)
Physical Therapy Evaluation Patient Details Name: Antonio [name] MRN: [id number] DOB: 1951-05-10 Today's Date: 08/12/2015   History of Present Illness  Antonio [name] is a 64 y.o. male with a PMH of severe aortic stenosis (s/p AVR 10/2011), T2DM, OSA, HLD, and chronic diastolic HF with Echo 01/2015 LVEF 55% with mild to moderate RV dysfunction. Found to have acute on chronic CHF  Clinical Impression  Pt presents with impairments as indicated below.  He notes increased swelling today as well as significant low back pain and L LE pain at rest that increase with mobility.  He also states that his SOB is not getting better but not getting worse either.  Pt O2 sats on room air were 90% at rest and decreased to 86% with ambulation. Pt requiring multiple standing rest breaks during ambulation due to dyspnea and back pain.  Further acute PT is recommended to maximize safety and independence with functional mobility and to increase activity tolerance.    Follow Up Recommendations Supervision for mobility/OOB (supervision initially)    Equipment Recommendations  Other (comment) (bariatric RW)    Recommendations for Other Services       Precautions / Restrictions Precautions Precautions: None Restrictions Weight Bearing Restrictions: No      Mobility  Bed Mobility               General bed mobility comments: pt received sitting in recliner  Transfers Overall transfer level: Modified independent Equipment used: Rolling walker (2 wheeled)                Ambulation/Gait Ambulation/Gait assistance: Supervision Ambulation Distance (Feet): 200 Feet Assistive device: Rolling walker (2 wheeled) Gait Pattern/deviations: Step-through pattern;Decreased stride length (upper body very tense with ambulation) Gait velocity: slow Gait velocity interpretation: Below normal speed for age/gender General Gait Details: Pt reporting increased low back pain with ambulation and some SOB.  Pt  requiring multiple short standing rest breaks.  O2 sats remained >85% on room air  Stairs            Wheelchair Mobility    Modified Rankin (Stroke Patients Only)       Balance   Sitting-balance support: Feet supported;No upper extremity supported Sitting balance-Leahy Scale: Good     Standing balance support: During functional activity Standing balance-Leahy Scale: Fair                               Pertinent Vitals/Pain Pain Assessment: Faces Faces Pain Scale: Hurts whole lot Pain Location: lower back pain and sharp/shooting pain down L LE at times Pain Descriptors / Indicators: Grimacing;Guarding;Aching;Sharp;Stabbing Pain Intervention(s): Monitored during session    Home Living Family/patient expects to be discharged to:: Private residence Living Arrangements: Alone Available Help at Discharge: Family;Available PRN/intermittently Type of Home: House Home Access: Stairs to enter Entrance Stairs-Rails: Right Entrance Stairs-Number of Steps: 3 Home Layout: One level Home Equipment: None Additional Comments: Pt usually furniture walks at home.  He was suppose to get a RW and 3n1 last admission but did not receive    Prior Function Level of Independence: Independent with assistive device(s)         Comments: Pt reports he drives and uses the motorized cart at the grocery store. He does not cook and mostly has meals delivered.      Hand Dominance   Dominant Hand: Right    Extremity/Trunk Assessment   Upper Extremity Assessment: Overall WFL for tasks  assessed           Lower Extremity Assessment: Overall WFL for tasks assessed         Communication   Communication: No difficulties  Cognition Arousal/Alertness: Awake/alert Behavior During Therapy: WFL for tasks assessed/performed Overall Cognitive Status: Within Functional Limits for tasks assessed                      General Comments General comments (skin integrity,  edema, etc.): Pt notes increased swelling today and his hospital bracelet is now too tight.  Pt mainly limited by low back pain, which PT provided hot pack for following session.  Pt educated on importance of LE elevation and AROM to help with swelling.  Education regarding pursed lip breathing and energy conservation techniques was also provided.    Exercises        Assessment/Plan    PT Assessment Patient needs continued PT services  PT Diagnosis Difficulty walking;Other (comment) (decreased activity tolerance)   PT Problem List Decreased activity tolerance;Decreased balance;Decreased mobility;Decreased safety awareness;Cardiopulmonary status limiting activity  PT Treatment Interventions DME instruction;Gait training;Stair training;Functional mobility training;Therapeutic activities;Therapeutic exercise;Patient/family education   PT Goals (Current goals can be found in the Care Plan section) Acute Rehab PT Goals Patient Stated Goal: to get fluid off of me PT Goal Formulation: With patient Time For Goal Achievement: 08/26/15 Potential to Achieve Goals: Good    Frequency Min 3X/week   Barriers to discharge        Co-evaluation               End of Session   Activity Tolerance: Patient tolerated treatment well Patient left: in chair;with call bell/phone within reach Nurse Communication: Mobility status         Time: 8616-8372 PT Time Calculation (min) (ACUTE ONLY): 27 min   Charges:   PT Evaluation $Initial PT Evaluation Tier I: 1 Procedure PT Treatments $Gait Training: 8-22 mins   PT G CodesMarchelle Folks Shareta Fishbaugh 15-Aug-2015, 12:26 PM   Arnoldo Morale, SPT

## 2015-08-12 NOTE — Progress Notes (Signed)
Advanced Heart Failure Rounding Note  Primary Cardiologist: Dr Alyson Ingles Primary HF: Dr Shirlee Latch  Subjective:    Feels OK, concerned about Cr going up.  Says at home he eats large portions and often orders food out.   Out 0.9 L and down 4 lbs with 15 mg/hr lasix gtt and 2.5 mg metolazone  Objective:   Weight Range: 393 lb 4.8 oz (178.4 kg) Body mass index is 54.88 kg/(m^2).   Vital Signs:   Temp:  [97.6 F (36.4 C)-98.2 F (36.8 C)] 97.8 F (36.6 C) (10/14 0508) Pulse Rate:  [84-93] 87 (10/14 0508) Resp:  [18-20] 20 (10/14 0508) BP: (90-122)/(41-69) 105/58 mmHg (10/14 0508) SpO2:  [91 %-98 %] 93 % (10/14 0508) Weight:  [393 lb 4.8 oz (178.4 kg)] 393 lb 4.8 oz (178.4 kg) (10/14 0508) Last BM Date: 08/11/15  Weight change: Filed Weights   08/11/15 0500 08/11/15 0629 08/12/15 0508  Weight: 397 lb 14.4 oz (180.486 kg) 397 lb 14.4 oz (180.486 kg) 393 lb 4.8 oz (178.4 kg)    Intake/Output:   Intake/Output Summary (Last 24 hours) at 08/12/15 0802 Last data filed at 08/12/15 0131  Gross per 24 hour  Intake    960 ml  Output   1875 ml  Net   -915 ml     Physical Exam: General: Morbidly obese HEENT: normal Neck: supple. Thick, JVP difficult to assess with pt girth but likely elevated. Carotids 2+ bilat; no bruits. No thyromegaly or nodule noted Cor: PMI nondisplaced. Distant heart sounds. RRR. No rubs, gallops. 2/6 SEM RUSB.  Lungs: Diminished at bases. No wheezing or crackles. Abdomen: severely obese, NT, ND. No HSM. No bruits or masses. +BS Extremities: no cyanosis, clubbing, rash. 1-2+ edema to mid calf. Neuro: alert & oriented x 3, cranial nerves grossly intact. moves all 4 extremities w/o difficulty. Affect pleasant  Telemetry: NSR 80s. QTc 504 per central telemetry.  Labs: CBC  Recent Labs  08/09/15 1300  WBC 9.3  NEUTROABS 5.3  HGB 11.7*  HCT 39.1  MCV 81.0  PLT 278   Basic Metabolic Panel  Recent Labs  08/09/15 1300  08/11/15 0350  08/12/15 0400  NA 137  < > 135 134*  K 4.5  < > 3.5 3.8  CL 96*  < > 92* 88*  CO2 30  < > 33* 32  GLUCOSE 232*  < > 159* 191*  BUN 45*  < > 49* 57*  CREATININE 1.51*  < > 1.62* 1.80*  CALCIUM 9.1  < > 8.9 9.1  MG 2.0  --   --   --   < > = values in this interval not displayed. Liver Function Tests  Recent Labs  08/09/15 1300  AST 32  ALT 27  ALKPHOS 89  BILITOT 0.5  PROT 7.1  ALBUMIN 3.4*   No results for input(s): LIPASE, AMYLASE in the last 72 hours. Cardiac Enzymes  Recent Labs  08/09/15 1300 08/09/15 2033 08/10/15 0223  TROPONINI <0.03 <0.03 <0.03    BNP: BNP (last 3 results)  Recent Labs  02/18/15 2116 06/14/15 1655 08/09/15 1300  BNP 67.3 49.4 10.4    ProBNP (last 3 results) No results for input(s): PROBNP in the last 8760 hours.   D-Dimer No results for input(s): DDIMER in the last 72 hours. Hemoglobin A1C No results for input(s): HGBA1C in the last 72 hours. Fasting Lipid Panel No results for input(s): CHOL, HDL, LDLCALC, TRIG, CHOLHDL, LDLDIRECT in the last 72 hours. Thyroid Function Tests  No results for input(s): TSH, T4TOTAL, T3FREE, THYROIDAB in the last 72 hours.  Invalid input(s): FREET3  Other results:     Imaging/Studies:  No results found.  Latest Echo  Latest Cath   Medications:     Scheduled Medications: . aspirin  81 mg Oral Daily  . atorvastatin  40 mg Oral q1800  . enoxaparin (LOVENOX) injection  90 mg Subcutaneous Q24H  . febuxostat  40 mg Oral Daily  . fenofibrate  54 mg Oral Daily  . Influenza vac split quadrivalent PF  0.5 mL Intramuscular Tomorrow-1000  . insulin aspart  0-26 Units Subcutaneous TID WC  . insulin glargine  60 Units Subcutaneous BID  . potassium chloride SA  40 mEq Oral BID  . sodium chloride  3 mL Intravenous Q12H    Infusions: . furosemide (LASIX) infusion 15 mg/hr (08/12/15 0556)    PRN Medications: sodium chloride, acetaminophen, HYDROcodone-acetaminophen, ondansetron (ZOFRAN)  IV, sodium chloride   Assessment/Plan  TREVIONNE ADVANI is a 64 y.o. male with a PMH of severe aortic stenosis (s/p AVR 10/2011), T2DM, OSA, HLD, and chronic diastolic HF with Echo 08/11/2015 LVEF 55-60% with normal RV function admitted from the HF clinic on 08/09/15 with 22 lbs weight gain since discharge in 05/2015  1. Acute on chronic diastolic CHF:  - Echo 08/11/2015 LVEF 55-60% with normal RV function - Remains volume overloaded, but improved.  Likely some of his weight is adipose. - Continue lasix gtt 15 mg/hr at least one more day. Will hold metolazone with Cr up slightly. - Continue supp K to keep > 4. Will add on Mg.  - Will get PT to see. Discussed importance of being active and losing weight.  2. S/p bioprosthetic AVR:  - Stable on 4/16 echo 3. AKI on CKD stage 3:  - Cr up slightly - Holding lisinopril - Follow closely with diuresis 4. Prolonged QTc - No current medicines require change.  Will need to follow closely with additions.  Dispo: Home 24 to 48 hours.  Length of Stay: 3  [name] PA-C 08/12/2015, 8:02 AM   Advanced Heart Failure Team Pager [phone] (M-F; 7a - 4p)  Please contact [facility] for night-coverage after hours (4p -7a ) and weekends on [email or web address]  Patient seen with PA, agree with the above note.  Weight down another lb.  Difficult exam for volume status but decreasing edema. Creatinine 1.6 => 1.8.  Echo with preserved EF and stable bioprosthetic aortic valve.  Would continue Lasix gtt one more day then start torsemide 80 qam/40 qpm for home.   We talked about weight loss strategies at length today. I think that it would be reasonable for him to consider bariatric surgery. We will provide him with the number for the program at [facility] prior to discharge.   [name] 08/12/2015

## 2015-08-13 DIAGNOSIS — I130 Hypertensive heart and chronic kidney disease with heart failure and stage 1 through stage 4 chronic kidney disease, or unspecified chronic kidney disease: Secondary | ICD-10-CM | POA: Diagnosis not present

## 2015-08-13 LAB — GLUCOSE, CAPILLARY
GLUCOSE-CAPILLARY: 205 mg/dL — AB (ref 65–99)
GLUCOSE-CAPILLARY: 212 mg/dL — AB (ref 65–99)
GLUCOSE-CAPILLARY: 147 mg/dL — AB (ref 65–99)
Glucose-Capillary: 189 mg/dL — ABNORMAL HIGH (ref 65–99)

## 2015-08-13 LAB — BASIC METABOLIC PANEL
ANION GAP: 13 (ref 5–15)
BUN: 63 mg/dL — ABNORMAL HIGH (ref 6–20)
CALCIUM: 9.2 mg/dL (ref 8.9–10.3)
CO2: 33 mmol/L — ABNORMAL HIGH (ref 22–32)
Chloride: 91 mmol/L — ABNORMAL LOW (ref 101–111)
Creatinine, Ser: 1.75 mg/dL — ABNORMAL HIGH (ref 0.61–1.24)
GFR, EST AFRICAN AMERICAN: 46 mL/min — AB (ref >60)
GFR, EST NON AFRICAN AMERICAN: 39 mL/min — AB (ref >60)
GLUCOSE: 180 mg/dL — AB (ref 65–99)
Potassium: 3.6 mmol/L (ref 3.5–5.1)
SODIUM: 137 mmol/L (ref 135–145)

## 2015-08-13 MED ORDER — TORSEMIDE 20 MG PO TABS
40.0000 mg | ORAL_TABLET | Freq: Every evening | ORAL | Status: DC
  Administered 2015-08-13: 40 mg via ORAL
  Filled 2015-08-13: qty 2

## 2015-08-13 MED ORDER — TORSEMIDE 20 MG PO TABS
80.0000 mg | ORAL_TABLET | Freq: Every day | ORAL | Status: DC
  Administered 2015-08-14: 80 mg via ORAL
  Filled 2015-08-13: qty 4

## 2015-08-13 NOTE — Progress Notes (Signed)
Patient ID: Antonio [name], male   DOB: [birth year], 64 y.o.   MRN: [id number]  Advanced Heart Failure Rounding Note  Primary Cardiologist: Dr Alyson Ingles Primary HF: Dr Shirlee Latch  Subjective:    Good UOP yesterday, creatinine stable.  Only complaint is low back pain.    Objective:   Weight Range: 393 lb (178.264 kg) Body mass index is 54.84 kg/(m^2).   Vital Signs:   Temp:  [98 F (36.7 C)-98.6 F (37 C)] 98.3 F (36.8 C) (10/15 0503) Pulse Rate:  [83-90] 83 (10/15 0503) Resp:  [17-21] 18 (10/15 0503) BP: (102-130)/(46-72) 120/72 mmHg (10/15 0503) SpO2:  [90 %-94 %] 92 % (10/15 0503) Weight:  [393 lb (178.264 kg)] 393 lb (178.264 kg) (10/15 0503) Last BM Date: 08/12/15  Weight change: Filed Weights   08/11/15 0629 08/12/15 0508 08/13/15 0503  Weight: 397 lb 14.4 oz (180.486 kg) 393 lb 4.8 oz (178.4 kg) 393 lb (178.264 kg)    Intake/Output:   Intake/Output Summary (Last 24 hours) at 08/13/15 1032 Last data filed at 08/13/15 1020  Gross per 24 hour  Intake    885 ml  Output   4520 ml  Net  -3635 ml     Physical Exam: General: Morbidly obese HEENT: normal Neck: supple. Thick, JVP difficult to assess, probably near-normal now. Carotids 2+ bilat; no bruits. No thyromegaly or nodule noted Cor: PMI nondisplaced. Distant heart sounds. RRR. No rubs, gallops. 2/6 SEM RUSB.  Lungs: Diminished at bases. No wheezing or crackles. Abdomen: severely obese, NT, ND. No HSM. No bruits or masses. +BS Extremities: no cyanosis, clubbing, rash. 1+ ankle edema, improved.  Neuro: alert & oriented x 3, cranial nerves grossly intact. moves all 4 extremities w/o difficulty. Affect pleasant  Telemetry: NSR.   Labs: CBC No results for input(s): WBC, NEUTROABS, HGB, HCT, MCV, PLT in the last 72 hours. Basic Metabolic Panel  Recent Labs  08/12/15 0400 08/12/15 0900 08/13/15 0501  NA 134*  --  137  K 3.8  --  3.6  CL 88*  --  91*  CO2 32  --  33*  GLUCOSE 191*  --  180*  BUN 57*  --   63*  CREATININE 1.80*  --  1.75*  CALCIUM 9.1  --  9.2  MG  --  2.4  --    Liver Function Tests No results for input(s): AST, ALT, ALKPHOS, BILITOT, PROT, ALBUMIN in the last 72 hours. No results for input(s): LIPASE, AMYLASE in the last 72 hours. Cardiac Enzymes No results for input(s): CKTOTAL, CKMB, CKMBINDEX, TROPONINI in the last 72 hours.  BNP: BNP (last 3 results)  Recent Labs  02/18/15 2116 06/14/15 1655 08/09/15 1300  BNP 67.3 49.4 10.4    ProBNP (last 3 results) No results for input(s): PROBNP in the last 8760 hours.   D-Dimer No results for input(s): DDIMER in the last 72 hours. Hemoglobin A1C No results for input(s): HGBA1C in the last 72 hours. Fasting Lipid Panel No results for input(s): CHOL, HDL, LDLCALC, TRIG, CHOLHDL, LDLDIRECT in the last 72 hours. Thyroid Function Tests No results for input(s): TSH, T4TOTAL, T3FREE, THYROIDAB in the last 72 hours.  Invalid input(s): FREET3  Other results:     Imaging/Studies:  No results found.  Latest Echo  Latest Cath   Medications:     Scheduled Medications: . aspirin  81 mg Oral Daily  . atorvastatin  40 mg Oral q1800  . enoxaparin (LOVENOX) injection  90 mg Subcutaneous Q24H  .  febuxostat  40 mg Oral Daily  . fenofibrate  54 mg Oral Daily  . Influenza vac split quadrivalent PF  0.5 mL Intramuscular Tomorrow-1000  . insulin aspart  0-26 Units Subcutaneous TID WC  . insulin glargine  60 Units Subcutaneous BID  . potassium chloride SA  40 mEq Oral BID  . sodium chloride  3 mL Intravenous Q12H  . torsemide  40 mg Oral QPM  . [START ON 08/14/2015] torsemide  80 mg Oral QPC breakfast    Infusions:    PRN Medications: sodium chloride, acetaminophen, HYDROcodone-acetaminophen, ondansetron (ZOFRAN) IV, sodium chloride, zolpidem   Assessment/Plan  RAED ARISPE is a 64 y.o. male with a PMH of severe aortic stenosis (s/p AVR 10/2011), T2DM, OSA, HLD, and chronic diastolic HF with Echo  08/11/2015 LVEF 55-60% with normal RV function admitted from the HF clinic on 08/09/15 with 22 lbs weight gain since discharge in 05/2015  1. Acute on chronic diastolic CHF: Echo 08/11/2015 LVEF 55-60% with normal RV function.  Improved volume status on exam (though difficult).  - I think that it is time to convert him over to po meds, will stop IV Lasix and start torsemide 80 qam/40 qpm.  - Continue supp K to keep > 4.  2. S/p bioprosthetic AVR: Stable by echo. 3. CKD stage 3: Creatinine stable.   Watch on po meds today, probably home in am.   [name] 08/13/2015

## 2015-08-14 ENCOUNTER — Other Ambulatory Visit: Payer: Self-pay | Admitting: Physician Assistant

## 2015-08-14 ENCOUNTER — Encounter (HOSPITAL_COMMUNITY): Payer: Self-pay | Admitting: Physician Assistant

## 2015-08-14 DIAGNOSIS — I350 Nonrheumatic aortic (valve) stenosis: Secondary | ICD-10-CM

## 2015-08-14 DIAGNOSIS — I5032 Chronic diastolic (congestive) heart failure: Secondary | ICD-10-CM

## 2015-08-14 LAB — BASIC METABOLIC PANEL
ANION GAP: 12 (ref 5–15)
BUN: 64 mg/dL — ABNORMAL HIGH (ref 6–20)
CHLORIDE: 88 mmol/L — AB (ref 101–111)
CO2: 33 mmol/L — AB (ref 22–32)
Calcium: 8.7 mg/dL — ABNORMAL LOW (ref 8.9–10.3)
Creatinine, Ser: 1.76 mg/dL — ABNORMAL HIGH (ref 0.61–1.24)
GFR calc Af Amer: 45 mL/min — ABNORMAL LOW (ref >60)
GFR, EST NON AFRICAN AMERICAN: 39 mL/min — AB (ref >60)
GLUCOSE: 170 mg/dL — AB (ref 65–99)
POTASSIUM: 3.7 mmol/L (ref 3.5–5.1)
Sodium: 133 mmol/L — ABNORMAL LOW (ref 135–145)

## 2015-08-14 LAB — GLUCOSE, CAPILLARY
Glucose-Capillary: 155 mg/dL — ABNORMAL HIGH (ref 65–99)
Glucose-Capillary: 178 mg/dL — ABNORMAL HIGH (ref 65–99)

## 2015-08-14 MED ORDER — TORSEMIDE 20 MG PO TABS: 80.0000 mg | ORAL_TABLET | ORAL | Status: DC

## 2015-08-14 MED ORDER — LISINOPRIL 5 MG PO TABS: 5.0000 mg | ORAL_TABLET | Freq: Every day | ORAL | Status: DC

## 2015-08-14 MED ORDER — POTASSIUM CHLORIDE CRYS ER 20 MEQ PO TBCR: 20.0000 meq | EXTENDED_RELEASE_TABLET | ORAL | Status: DC

## 2015-08-14 NOTE — Discharge Summary (Signed)
Discharge Summary   Patient ID: TYLIN FORCE MRN: [id number], DOB/AGE: May 07, 1951 64 y.o. Admit date: 08/09/2015 D/C date:     08/14/2015  Primary Cardiologist: Dr Alyson Ingles Primary HF: Dr Shirlee Latch  Principal Problem:   Acute on chronic diastolic CHF (congestive heart failure), NYHA class 3 (HCC) Active Problems:   Hyperlipidemia   Gout   OBESITY, NOS   HYPERTENSION, BENIGN SYSTEMIC   Aortic stenosis   PAF (paroxysmal atrial fibrillation) (HCC)   S/P aortic valve replacement with bioprosthetic valve   Admission Dates: 08/09/15-08/14/15 Discharge Diagnosis: Acute on chronic diastolic CHF. Discharge weight 393 lbs.  HPI: Antonio [name] is a 64 y.o. male with a history of severe aortic stenosis (s/p AVR 10/2011), T2DM, OSA, HLD, and chronic diastolic HF with Echo 08/11/2015 LVEF 55-60% with normal RV function admitted from the HF clinic on 08/09/15 with 22 lbs weight gain since discharge in 05/2015.  He was admitted on 06/14/15 for worsening dyspnea and weight gain (384lbs 05/30/15 and 397lbs on admission). He diuresed 15 L and down 11 lbs on IV lasix up to 100 mg TID. Discharge weight 382.  He returned for post hospital follow up with Otilio Saber PA-C on 08/09/15 after cancelling his initial post hospital follow up due to being on a road trip.He was found to be up 22 lbs from discharge weight. He was non complaint with fluid restrictions and had dyspnea with minimal activities and orthopnea. He was seen by Dr. Shirlee Latch who felt he required admission for IV diuresis and further management.  Hospital Course  1. Acute on chronic diastolic CHF: Echo 08/11/2015 LVEF 55-60% with normal RV function. Improved volume status on exam (though difficult). Weight down 12 lbs. Discharge weight 393 lbs. - Continue torsemide 80 qam/40 qpm and KCl 40 qam/20 qpm - I have arranged for CHF home health   2. S/p bioprosthetic AVR: Stable by echo.  3. CKD stage 3: Creatinine stable. Creat 1.76 today.  Needs a BMET at follow up next week.  4. HTN- BP soft. Will decrease home lisinopril  to  qd.   Dispo- Send home on torsemide 80 qam/40 qpm, KCl 40 qam/20 qpm, ASA 81, atorvastatin 40 daily, other meds as prior to admission.   The patient has had an uncomplicated hospital course and is recovering well. He has been seen by Dr. Shirlee Latch today and deemed ready for discharge home. A staff message has been sent to arrange follow-up appointments. Discharge medications are listed below.   Discharge Vitals: Blood pressure 100/65, pulse 88, temperature 97.6 F (36.4 C), temperature source Oral, resp. rate 20, height  (1.803 m), weight 393 lb 9.6 oz (178.536 kg), SpO2 92 %.  Labs: Lab Results  Component Value Date   WBC 9.3 08/09/2015   HGB 11.7* 08/09/2015   HCT 39.1 08/09/2015   MCV 81.0 08/09/2015   PLT 278 08/09/2015    Recent Labs Lab 08/09/15 1300  08/14/15 0558  NA 137  < > 133*  K 4.5  < > 3.7  CL 96*  < > 88*  CO2 30  < > 33*  BUN 45*  < > 64*  CREATININE 1.51*  < > 1.76*  CALCIUM 9.1  < > 8.7*  PROT 7.1  --   --   BILITOT 0.5  --   --   ALKPHOS 89  --   --   ALT 27  --   --   AST 32  --   --  GLUCOSE 232*  < > 170*  < > = values in this interval not displayed. No results for input(s): CKTOTAL, CKMB, TROPONINI in the last 72 hours. Lab Results  Component Value Date   CHOL 112 11/27/2011   HDL 26* 11/27/2011   LDLCALC 54 11/27/2011   TRIG 161* 11/27/2011   Lab Results  Component Value Date   DDIMER 0.40 06/15/2015    Diagnostic Studies/Procedures   Dg Chest Port 1 View  08/10/2015  CLINICAL DATA:  CHF. EXAM: PORTABLE CHEST 1 VIEW COMPARISON:  06/14/2015. FINDINGS: Prior median sternotomy. Cardiomegaly with mild pulmonary vascular prominence and interstitial prominence consistent mild congestive heart failure. Bibasilar subsegmental atelectasis and/or infiltrates. No pleural effusion or pneumothorax. IMPRESSION: 1. Prior median sternotomy.  Cardiomegaly. No pulmonary venous congestion. 2. Persistent bibasilar atelectasis . Electronically Signed   By: Maisie Fus  Register   On: 08/10/2015 07:17     2D ECHO: 08/11/2015 LV EF: 55 -60% Study Conclusions - Left ventricle: The cavity size was normal. Wall thickness was increased in a pattern of moderate LVH. Systolic function was normal. The estimated ejection fraction was in the range of 55% to 60%. Wall motion was normal; there were no regional wall motion abnormalities. The study is not technically sufficient to allow evaluation of LV diastolic function. - Aortic valve: A bioprosthesis was present. Transvalvular velocity was within the normal range. There was no stenosis. There was no regurgitation. Peak velocity (S): 231 cm/s. Mean gradient (S): 13 mm Hg. VTI ratio of LVOT to aortic valve: 0.4. Aortic valve area 1.08 cm^2. - Mitral valve: Structurally normal valve. Transvalvular velocity was within the normal range. There was no evidence for stenosis. There was no regurgitation. - Right ventricle: The cavity size was normal. Wall thickness was normal. Systolic function was normal. - Tricuspid valve: There was no regurgitation. - Inferior vena cava: The vessel was dilated. The respirophasic diameter changes were in the normal range (>= 50%), consistent with elevated central venous pressure.   Discharge Medications     Medication List    TAKE these medications        aspirin 81 MG chewable tablet  Chew 1 tablet (81 mg total) by mouth daily.     atorvastatin 40 MG tablet  Commonly known as:  LIPITOR  Take 40 mg by mouth daily at 6 PM.     colchicine 0.6 MG tablet  Take 0.6 mg by mouth daily.     febuxostat 40 MG tablet  Commonly known as:  ULORIC  Take 40 mg by mouth daily.     fenofibrate 54 MG tablet  Take 54 mg by mouth daily.     HYDROcodone-acetaminophen 7.5-325 MG tablet  Commonly known as:  NORCO  Take 1 tablet by mouth 2  (two) times daily as needed for moderate pain.     insulin glargine 100 UNIT/ML injection  Commonly known as:  LANTUS  Inject 60 Units into the skin 2 (two) times daily. As directed     lisinopril 5 MG tablet  Commonly known as:  PRINIVIL,ZESTRIL  Take 1 tablet (5 mg total) by mouth daily.     NOVOLOG 100 UNIT/ML injection  Generic drug:  insulin aspart  Inject 0-26 Units into the skin 3 (three) times daily with meals. Sliding scale, <70 = 0, 70-90 = 6 units, 91-130 = 10 units (base dose), 131-150 = 12 units, 151-200 = 14 units, 201-250 = 16 units, 251-300 = 18 units, 301-350 = 20 units, 351-400 = 22 units,  401-450 = 24 units, >450 = 26 units     potassium chloride SA 20 MEQ tablet  Commonly known as:  K-DUR,KLOR-CON  Take 1 tablet (20 mEq total) by mouth See admin instructions. Take 40mg  (2 tablets) every morning and 20mg  ( 1 tablet) every evening     SSD 1 % cream  Generic drug:  silver sulfADIAZINE  Apply 1 application topically 2 (two) times daily.     torsemide 20 MG tablet  Commonly known as:  DEMADEX  Take 4 tablets (80 mg total) by mouth See admin instructions. Take 80mg  (4 tablets) every morning and 40mg  (2 tablets) every evening        Disposition   The patient will be discharged in stable condition to home.  Follow-up Information    Follow up with [name], MD On 08/24/2015.   Specialty:  Cardiology   Why:  at 200 pm for post hospital follow up.  Please bring all of your medications to your visit. Code for patient parking is 0900.   Contact information:   [address] [place] [address] [phone]         Duration of Discharge Encounter: Greater than 30 minutes including physician and PA time.  Byrd Hesselbach R PA-C 08/14/2015, 9:54 AM

## 2015-08-14 NOTE — Progress Notes (Signed)
Home medications retrieved from pharmacy and returned to pt; yellow copy of form placed in chart.

## 2015-08-14 NOTE — Discharge Instructions (Signed)

## 2015-08-14 NOTE — Progress Notes (Signed)
Orders received for pt discharge.  Discharge summary printed and reviewed with pt.  Explained medication regimen, and pt had no further questions at this time.  IV removed and site remains clean, dry, intact.  Telemetry removed.  Pt in stable condition and awaiting transport. 

## 2015-08-14 NOTE — Progress Notes (Signed)
Patient ID: Antonio [name], male   DOB: 04-03-1951, 64 y.o.   MRN: [id number]  Advanced Heart Failure Rounding Note  Primary Cardiologist: Dr Alyson Ingles Primary HF: Dr Shirlee Latch  Subjective:    Good UOP yesterday after conversion to oral meds.  No dyspnea.  Chronic low back pain.    Objective:   Weight Range: 393 lb 9.6 oz (178.536 kg) Body mass index is 54.92 kg/(m^2).   Vital Signs:   Temp:  [97.6 F (36.4 C)-97.8 F (36.6 C)] 97.6 F (36.4 C) (10/16 0500) Pulse Rate:  [84-91] 88 (10/16 0500) Resp:  [20] 20 (10/16 0500) BP: (100-110)/(51-65) 100/65 mmHg (10/16 0500) SpO2:  [92 %-93 %] 92 % (10/16 0500) Weight:  [393 lb 9.6 oz (178.536 kg)] 393 lb 9.6 oz (178.536 kg) (10/16 0500) Last BM Date: 08/13/15  Weight change: Filed Weights   08/12/15 0508 08/13/15 0503 08/14/15 0500  Weight: 393 lb 4.8 oz (178.4 kg) 393 lb (178.264 kg) 393 lb 9.6 oz (178.536 kg)    Intake/Output:   Intake/Output Summary (Last 24 hours) at 08/14/15 0854 Last data filed at 08/14/15 0550  Gross per 24 hour  Intake   1182 ml  Output   3050 ml  Net  -1868 ml     Physical Exam: General: Morbidly obese HEENT: normal Neck: supple. Thick, JVP difficult to assess, probably near-normal now. Carotids 2+ bilat; no bruits. No thyromegaly or nodule noted Cor: PMI nondisplaced. Distant heart sounds. RRR. No rubs, gallops. 2/6 SEM RUSB.  Lungs: Diminished at bases. No wheezing or crackles. Abdomen: severely obese, NT, ND. No HSM. No bruits or masses. +BS Extremities: no cyanosis, clubbing, rash. 1+ ankle edema, improved.  Neuro: alert & oriented x 3, cranial nerves grossly intact. moves all 4 extremities w/o difficulty. Affect pleasant  Telemetry: NSR.   Labs: CBC No results for input(s): WBC, NEUTROABS, HGB, HCT, MCV, PLT in the last 72 hours. Basic Metabolic Panel  Recent Labs  08/12/15 0900 08/13/15 0501 08/14/15 0558  NA  --  137 133*  K  --  3.6 3.7  CL  --  91* 88*  CO2  --  33* 33*   GLUCOSE  --  180* 170*  BUN  --  63* 64*  CREATININE  --  1.75* 1.76*  CALCIUM  --  9.2 8.7*  MG 2.4  --   --    Liver Function Tests No results for input(s): AST, ALT, ALKPHOS, BILITOT, PROT, ALBUMIN in the last 72 hours. No results for input(s): LIPASE, AMYLASE in the last 72 hours. Cardiac Enzymes No results for input(s): CKTOTAL, CKMB, CKMBINDEX, TROPONINI in the last 72 hours.  BNP: BNP (last 3 results)  Recent Labs  02/18/15 2116 06/14/15 1655 08/09/15 1300  BNP 67.3 49.4 10.4    ProBNP (last 3 results) No results for input(s): PROBNP in the last 8760 hours.   D-Dimer No results for input(s): DDIMER in the last 72 hours. Hemoglobin A1C No results for input(s): HGBA1C in the last 72 hours. Fasting Lipid Panel No results for input(s): CHOL, HDL, LDLCALC, TRIG, CHOLHDL, LDLDIRECT in the last 72 hours. Thyroid Function Tests No results for input(s): TSH, T4TOTAL, T3FREE, THYROIDAB in the last 72 hours.  Invalid input(s): FREET3  Other results:     Imaging/Studies:  No results found.  Latest Echo  Latest Cath   Medications:     Scheduled Medications: . aspirin  81 mg Oral Daily  . atorvastatin  40 mg Oral q1800  .  enoxaparin (LOVENOX) injection  90 mg Subcutaneous Q24H  . febuxostat  40 mg Oral Daily  . fenofibrate  54 mg Oral Daily  . insulin aspart  0-26 Units Subcutaneous TID WC  . insulin glargine  60 Units Subcutaneous BID  . potassium chloride SA  40 mEq Oral BID  . sodium chloride  3 mL Intravenous Q12H  . torsemide  40 mg Oral QPM  . torsemide  80 mg Oral QPC breakfast    Infusions:    PRN Medications: sodium chloride, acetaminophen, HYDROcodone-acetaminophen, ondansetron (ZOFRAN) IV, sodium chloride, zolpidem   Assessment/Plan  CHEVY [name] is a 64 y.o. male with a PMH of severe aortic stenosis (s/p AVR 10/2011), T2DM, OSA, HLD, and chronic diastolic HF with Echo 08/11/2015 LVEF 55-60% with normal RV function admitted from the  HF clinic on 08/09/15 with 22 lbs weight gain since discharge in 05/2015 1. Acute on chronic diastolic CHF: Echo 08/11/2015 LVEF 55-60% with normal RV function.  Improved volume status on exam (though difficult). Weight down 12 lbs.  - Continue torsemide 80 qam/40 qpm.  - Continue supp K to keep > 4.  2. S/p bioprosthetic AVR: Stable by echo. 3. CKD stage 3: Creatinine stable.  4. Disposition: I think that he is stable to go home today.  Will need followup in CHF clinic later this week with BMET.  Needs home health for CHF monitoring. Send home on torsemide 80 qam/40 qpm, KCl 40 qam/20 qpm, ASA 81, atorvastatin 40 daily, other meds as prior to admission.    Antonio [name] 08/14/2015

## 2015-08-24 ENCOUNTER — Inpatient Hospital Stay (HOSPITAL_COMMUNITY): Admit: 2015-08-24 | Payer: Medicare Other

## 2015-09-08 ENCOUNTER — Ambulatory Visit (HOSPITAL_COMMUNITY)
Admission: RE | Admit: 2015-09-08 | Discharge: 2015-09-08 | Disposition: A | Discharge status: Home / Self Care | Payer: Medicare Other | Source: Ambulatory Visit | Attending: Cardiology | Admitting: Cardiology

## 2015-09-08 ENCOUNTER — Encounter (HOSPITAL_COMMUNITY): Payer: Self-pay

## 2015-09-08 VITALS — BP 108/54 | HR 92 | Wt >= 6400 oz

## 2015-09-08 DIAGNOSIS — Z79899 Other long term (current) drug therapy: Secondary | ICD-10-CM | POA: Diagnosis not present

## 2015-09-08 DIAGNOSIS — E039 Hypothyroidism, unspecified: Secondary | ICD-10-CM | POA: Diagnosis not present

## 2015-09-08 DIAGNOSIS — Z953 Presence of xenogenic heart valve: Secondary | ICD-10-CM

## 2015-09-08 DIAGNOSIS — M109 Gout, unspecified: Secondary | ICD-10-CM | POA: Diagnosis not present

## 2015-09-08 DIAGNOSIS — Z7982 Long term (current) use of aspirin: Secondary | ICD-10-CM | POA: Insufficient documentation

## 2015-09-08 DIAGNOSIS — G4733 Obstructive sleep apnea (adult) (pediatric): Secondary | ICD-10-CM | POA: Insufficient documentation

## 2015-09-08 DIAGNOSIS — I5032 Chronic diastolic (congestive) heart failure: Secondary | ICD-10-CM | POA: Insufficient documentation

## 2015-09-08 DIAGNOSIS — E1122 Type 2 diabetes mellitus with diabetic chronic kidney disease: Secondary | ICD-10-CM | POA: Insufficient documentation

## 2015-09-08 DIAGNOSIS — N183 Chronic kidney disease, stage 3 (moderate): Secondary | ICD-10-CM | POA: Insufficient documentation

## 2015-09-08 DIAGNOSIS — Z954 Presence of other heart-valve replacement: Secondary | ICD-10-CM

## 2015-09-08 DIAGNOSIS — Z794 Long term (current) use of insulin: Secondary | ICD-10-CM | POA: Diagnosis not present

## 2015-09-08 DIAGNOSIS — I5033 Acute on chronic diastolic (congestive) heart failure: Secondary | ICD-10-CM

## 2015-09-08 DIAGNOSIS — Z8249 Family history of ischemic heart disease and other diseases of the circulatory system: Secondary | ICD-10-CM | POA: Diagnosis not present

## 2015-09-08 LAB — LIPID PANEL
Cholesterol: 158 mg/dL (ref 0–200)
HDL: 36 mg/dL — ABNORMAL LOW (ref >40)
LDL CALC: 88 mg/dL (ref 0–99)
Total CHOL/HDL Ratio: 4.4 RATIO
Triglycerides: 172 mg/dL — ABNORMAL HIGH (ref <150)
VLDL: 34 mg/dL (ref 0–40)

## 2015-09-08 LAB — BASIC METABOLIC PANEL
Anion gap: 10 (ref 5–15)
BUN: 43 mg/dL — AB (ref 6–20)
CALCIUM: 9.2 mg/dL (ref 8.9–10.3)
CO2: 35 mmol/L — ABNORMAL HIGH (ref 22–32)
CREATININE: 1.68 mg/dL — AB (ref 0.61–1.24)
Chloride: 92 mmol/L — ABNORMAL LOW (ref 101–111)
GFR calc Af Amer: 48 mL/min — ABNORMAL LOW (ref >60)
GFR, EST NON AFRICAN AMERICAN: 41 mL/min — AB (ref >60)
GLUCOSE: 222 mg/dL — AB (ref 65–99)
Potassium: 4.3 mmol/L (ref 3.5–5.1)
SODIUM: 137 mmol/L (ref 135–145)

## 2015-09-08 LAB — BRAIN NATRIURETIC PEPTIDE: B Natriuretic Peptide: 14.8 pg/mL (ref 0.0–100.0)

## 2015-09-08 MED ORDER — POTASSIUM CHLORIDE CRYS ER 20 MEQ PO TBCR: 40.0000 meq | EXTENDED_RELEASE_TABLET | Freq: Two times a day (BID) | ORAL | Status: DC

## 2015-09-08 MED ORDER — TORSEMIDE 20 MG PO TABS: 80.0000 mg | ORAL_TABLET | Freq: Two times a day (BID) | ORAL | Status: DC

## 2015-09-08 MED ORDER — METOLAZONE 2.5 MG PO TABS: 2.5000 mg | ORAL_TABLET | ORAL | Status: DC

## 2015-09-08 NOTE — Patient Instructions (Signed)
Increase Torsemide to 80 mg (4 tabs) Twice daily   Increase Potassium to 40 meq (2 tabs) Twice daily, ON Friday TAKE AN EXTRA 40 MEQ (2 TABS)    Start Metolazone 2.5 mg EVERY Friday, take 30 minutes before your AM dose of Torsemide  Labs today  You have been referred to [facility]  Your physician recommends that you schedule a follow-up appointment in: 10 days with Dr Shirlee Latch

## 2015-09-08 NOTE — Progress Notes (Signed)
Patient ID: Antonio [name], male   DOB: [birth year], 64 y.o.   MRN: [id number] PCP: Dr Antonio [name] [facility]) HF Cardiology: Dr. Shirlee [name]  64 yo with bioprosthetic AVR, chronic diastolic CHF, CKD and morbid obesity presents for CHF clinic followup.  Last echo in 10/16 showed normal bioprosthetic aortic valve and normal EF.  He was recently admitted in 10/16 with volume overload and diuresed.  He was sent home on torsemide 80 qam/40 qpm.  He sees a nephrologist in [facility].    He lives alone.  He does all his ADLs but they are getting harder due to lack of mobility. Weight is up 13 lbs compared to discharge weight.  He says that he has not missed his torsemide. For several months, he has been short of breath walking around his house.  He sleeps in a chair due to orthopnea.  No chest pain, lightheadedness, syncope. He walks with a walker.    Labs (10/16): K 3.7, creatinine 1.76, hgb 11.7   PMH: 1. Severe aortic stenosis: s/p bioprosthetic AVR in 1/13.  2. Chronic diastolic CHF: Echo (10/16) with EF 55-60%, bioprosthetic AVR with mean gradient 13 mmHg, normal RV size and systolic function.   3. OSA 4. Type II diabetes 5. Gout  6. CKD 7. Subclinical hypothyroidism 8. LHC (1/12) with normal coronaries. 9. Chronic LBBB 10. Morbid obesity  Family History  Problem Relation Age of Onset  . Heart attack Father   . Cancer Mother    Social History   Social History  . Marital Status: Divorced    Spouse Name: N/A  . Number of Children: N/A  . Years of Education: N/A   Occupational History  . retired travelling Engineer, site    Social History Main Topics  . Smoking status: Never Smoker   . Smokeless tobacco: Never Used  . Alcohol Use: Yes     Comment: 02/18/2015 "once or twice/yr I'll have a cocktail"      rare  . Drug Use: No  . Sexual Activity: Not Currently   Other Topics Concern  . None   Social History Narrative   ROS: All systems reviewed and negative except as per HPI  Current  Outpatient Prescriptions  Medication Sig Dispense Refill  . aspirin 81 MG chewable tablet Chew 1 tablet (81 mg total) by mouth daily. 30 tablet   . atorvastatin (LIPITOR) 40 MG tablet Take 40 mg by mouth daily at 6 PM.     . colchicine 0.6 MG tablet Take 0.6 mg by mouth daily.     . febuxostat (ULORIC) 40 MG tablet Take 40 mg by mouth daily.     . fenofibrate 54 MG tablet Take 54 mg by mouth daily.  0  . HYDROcodone-acetaminophen (NORCO) 7.5-325 MG per tablet Take 1 tablet by mouth 2 (two) times daily as needed for moderate pain.     Marland Kitchen insulin glargine (LANTUS) 100 UNIT/ML injection Inject 60 Units into the skin 2 (two) times daily. As directed    . lisinopril (PRINIVIL,ZESTRIL) 5 MG tablet Take 1 tablet (5 mg total) by mouth daily. 30 tablet 3  . NOVOLOG 100 UNIT/ML injection Inject 0-26 Units into the skin 3 (three) times daily with meals. Sliding scale, <70 = 0, 70-90 = 6 units, 91-130 = 10 units (base dose), 131-150 = 12 units, 151-200 = 14 units, 201-250 = 16 units, 251-300 = 18 units, 301-350 = 20 units, 351-400 = 22 units, 401-450 = 24 units, >450 = 26 units  0  . potassium chloride SA (K-DUR,KLOR-CON) 20 MEQ tablet Take 2 tablets (40 mEq total) by mouth 2 (two) times daily. Take 2 extra tabs when you take Metolazone 130 tablet 6  . SSD 1 % cream Apply 1 application topically 2 (two) times daily.   0  . torsemide (DEMADEX) 20 MG tablet Take 4 tablets (80 mg total) by mouth 2 (two) times daily. 180 tablet 6  . metolazone (ZAROXOLYN) 2.5 MG tablet Take 1 tablet (2.5 mg total) by mouth once a week. Take every Friday 30 min prior to your AM Torsemide dose 10 tablet 3   No current facility-administered medications for this encounter.   BP 108/54 mmHg  Pulse 92  Wt 406 lb (184.16 kg)  SpO2 99% General: NAD Neck: Thick, JVP difficult. no thyromegaly or thyroid nodule.  Lungs: Clear to auscultation bilaterally with normal respiratory effort. CV: Nondisplaced PMI.  Heart regular S1/S2, no  S3/S4, 1/6 SEM RUSB.  2+ edema to knees bilaterally.  No carotid bruit.  Normal pedal pulses.  Abdomen: Soft, nontender, no hepatosplenomegaly, no distention.  Skin: Intact without lesions or rashes.  Neurologic: Alert and oriented x 3.  Psych: Normal affect. Extremities: No clubbing or cyanosis.  HEENT: Normal.   Assessment/Plan: 1. Chronic diastolic CHF: [name] has gained considerable weight since discharge and is volume overloaded on exam.  NYHA class IIIb symptoms.  He is very limited at home.  He is interested in possibly going into a rehab facility.  - Increase torsemide to 80 mg bid and increase KCl to 40 mEq bid.  - Metolazone 2.5 qweek on Fridays 30 minutes before am torsemide.  He will take an extra 40 mEq KCl on metolazone days.   - BMET/BNP today, followup in 10 days with BMET. 2. CKD: Stage III.  Follow creatinine closely with increase in diuretics.  3. Bioprosthetic aortic valve: Stable on last echo.  4. I will arrange for home health evaluation and home PT.   Antonio [name] 09/08/2015

## 2015-09-14 ENCOUNTER — Telehealth (HOSPITAL_COMMUNITY): Payer: Self-pay | Admitting: *Deleted

## 2015-09-14 NOTE — Telephone Encounter (Signed)
Diane, RN w/[facility] left a VM earlier this afternoon stating she was concerned about pt and was sending him to ER to be admitted.  I called Diane back and spoke w/her, she states it was [facility] first visit with pt today and his legs are very swollen, "appear they will burst open" and pt is SOB w/minimal activity.  She pt reports these symptoms are worse since last week and he very weak and scared he may fall and injury himself.  She states pt was gong to get a friend to bring him to ER but it might not be until tomorrow, he was agreeable to this and to being admitting and possibly being d/c to a rehab center.  Discussed all with Dr Shirlee Latch he states if pt is feeling bad can go to ER tonight, if pt feels somewhat stable and is ok to wait until tomorrow he could be direct admitted then.    Called and spoke w/pt, he states he is doing worse than last week when he was here but doesn't feel he needs to go to ER tonight, he is agreeable to being direct admitted tomorrow.  Arranged admit for tele bed 11/17.

## 2015-09-15 ENCOUNTER — Inpatient Hospital Stay (HOSPITAL_COMMUNITY)
Admission: AD | Admit: 2015-09-15 | Discharge: 2015-09-19 | DRG: 291 | Disposition: A | Discharge status: Skilled Nursing Facility | Payer: Medicare Other | Source: Ambulatory Visit | Attending: Cardiology | Admitting: Cardiology

## 2015-09-15 ENCOUNTER — Inpatient Hospital Stay (HOSPITAL_COMMUNITY): Payer: Medicare Other

## 2015-09-15 ENCOUNTER — Encounter (HOSPITAL_COMMUNITY): Payer: Self-pay | Admitting: *Deleted

## 2015-09-15 DIAGNOSIS — I5033 Acute on chronic diastolic (congestive) heart failure: Secondary | ICD-10-CM

## 2015-09-15 DIAGNOSIS — Z952 Presence of prosthetic heart valve: Secondary | ICD-10-CM

## 2015-09-15 DIAGNOSIS — L97519 Non-pressure chronic ulcer of other part of right foot with unspecified severity: Secondary | ICD-10-CM | POA: Diagnosis present

## 2015-09-15 DIAGNOSIS — R627 Adult failure to thrive: Secondary | ICD-10-CM | POA: Diagnosis present

## 2015-09-15 DIAGNOSIS — Z8614 Personal history of Methicillin resistant Staphylococcus aureus infection: Secondary | ICD-10-CM

## 2015-09-15 DIAGNOSIS — I130 Hypertensive heart and chronic kidney disease with heart failure and stage 1 through stage 4 chronic kidney disease, or unspecified chronic kidney disease: Secondary | ICD-10-CM | POA: Diagnosis present

## 2015-09-15 DIAGNOSIS — L97529 Non-pressure chronic ulcer of other part of left foot with unspecified severity: Secondary | ICD-10-CM | POA: Diagnosis present

## 2015-09-15 DIAGNOSIS — E11621 Type 2 diabetes mellitus with foot ulcer: Secondary | ICD-10-CM | POA: Diagnosis present

## 2015-09-15 DIAGNOSIS — Z7982 Long term (current) use of aspirin: Secondary | ICD-10-CM

## 2015-09-15 DIAGNOSIS — M1990 Unspecified osteoarthritis, unspecified site: Secondary | ICD-10-CM | POA: Diagnosis present

## 2015-09-15 DIAGNOSIS — E1122 Type 2 diabetes mellitus with diabetic chronic kidney disease: Secondary | ICD-10-CM | POA: Diagnosis present

## 2015-09-15 DIAGNOSIS — H5441 Blindness, right eye, normal vision left eye: Secondary | ICD-10-CM | POA: Diagnosis present

## 2015-09-15 DIAGNOSIS — I739 Peripheral vascular disease, unspecified: Secondary | ICD-10-CM | POA: Diagnosis not present

## 2015-09-15 DIAGNOSIS — Z8249 Family history of ischemic heart disease and other diseases of the circulatory system: Secondary | ICD-10-CM

## 2015-09-15 DIAGNOSIS — Z794 Long term (current) use of insulin: Secondary | ICD-10-CM | POA: Diagnosis not present

## 2015-09-15 DIAGNOSIS — Z6841 Body Mass Index (BMI) 40.0 and over, adult: Secondary | ICD-10-CM | POA: Diagnosis not present

## 2015-09-15 DIAGNOSIS — E785 Hyperlipidemia, unspecified: Secondary | ICD-10-CM | POA: Diagnosis present

## 2015-09-15 DIAGNOSIS — G4733 Obstructive sleep apnea (adult) (pediatric): Secondary | ICD-10-CM | POA: Diagnosis present

## 2015-09-15 DIAGNOSIS — N183 Chronic kidney disease, stage 3 (moderate): Secondary | ICD-10-CM | POA: Diagnosis present

## 2015-09-15 DIAGNOSIS — R600 Localized edema: Secondary | ICD-10-CM | POA: Diagnosis present

## 2015-09-15 LAB — COMPREHENSIVE METABOLIC PANEL
ALT: 17 U/L (ref 17–63)
ANION GAP: 8 (ref 5–15)
AST: 17 U/L (ref 15–41)
Albumin: 3.5 g/dL (ref 3.5–5.0)
Alkaline Phosphatase: 91 U/L (ref 38–126)
BUN: 49 mg/dL — ABNORMAL HIGH (ref 6–20)
CHLORIDE: 97 mmol/L — AB (ref 101–111)
CO2: 34 mmol/L — AB (ref 22–32)
Calcium: 9.3 mg/dL (ref 8.9–10.3)
Creatinine, Ser: 1.5 mg/dL — ABNORMAL HIGH (ref 0.61–1.24)
GFR, EST AFRICAN AMERICAN: 55 mL/min — AB (ref >60)
GFR, EST NON AFRICAN AMERICAN: 47 mL/min — AB (ref >60)
Glucose, Bld: 228 mg/dL — ABNORMAL HIGH (ref 65–99)
POTASSIUM: 4 mmol/L (ref 3.5–5.1)
SODIUM: 139 mmol/L (ref 135–145)
Total Bilirubin: 0.4 mg/dL (ref 0.3–1.2)
Total Protein: 7.1 g/dL (ref 6.5–8.1)

## 2015-09-15 LAB — CBC WITH DIFFERENTIAL/PLATELET
Basophils Absolute: 0 10*3/uL (ref 0.0–0.1)
Basophils Relative: 0 %
EOS ABS: 0.3 10*3/uL (ref 0.0–0.7)
Eosinophils Relative: 3 %
HEMATOCRIT: 38.9 % — AB (ref 39.0–52.0)
HEMOGLOBIN: 11.6 g/dL — AB (ref 13.0–17.0)
LYMPHS ABS: 3.5 10*3/uL (ref 0.7–4.0)
Lymphocytes Relative: 31 %
MCH: 24 pg — AB (ref 26.0–34.0)
MCHC: 29.8 g/dL — AB (ref 30.0–36.0)
MCV: 80.5 fL (ref 78.0–100.0)
MONOS PCT: 5 %
Monocytes Absolute: 0.6 10*3/uL (ref 0.1–1.0)
NEUTROS ABS: 6.8 10*3/uL (ref 1.7–7.7)
NEUTROS PCT: 61 %
Platelets: 270 10*3/uL (ref 150–400)
RBC: 4.83 MIL/uL (ref 4.22–5.81)
RDW: 14.9 % (ref 11.5–15.5)
WBC: 11.2 10*3/uL — AB (ref 4.0–10.5)

## 2015-09-15 LAB — GLUCOSE, CAPILLARY
Glucose-Capillary: 273 mg/dL — ABNORMAL HIGH (ref 65–99)
Glucose-Capillary: 242 mg/dL — ABNORMAL HIGH (ref 65–99)

## 2015-09-15 LAB — TROPONIN I
TROPONIN I: 0.03 ng/mL (ref <0.031)
TROPONIN I: 0.03 ng/mL (ref <0.031)

## 2015-09-15 LAB — MRSA PCR SCREENING: MRSA by PCR: NEGATIVE

## 2015-09-15 LAB — BRAIN NATRIURETIC PEPTIDE: B NATRIURETIC PEPTIDE 5: 20 pg/mL (ref 0.0–100.0)

## 2015-09-15 MED ORDER — ASPIRIN 81 MG PO CHEW
81.0000 mg | CHEWABLE_TABLET | Freq: Every day | ORAL | Status: DC
  Administered 2015-09-15 – 2015-09-19 (×5): 81 mg via ORAL
  Filled 2015-09-15 (×6): qty 1

## 2015-09-15 MED ORDER — FUROSEMIDE 10 MG/ML IJ SOLN
120.0000 mg | Freq: Two times a day (BID) | INTRAVENOUS | Status: DC
  Administered 2015-09-15 – 2015-09-19 (×8): 120 mg via INTRAVENOUS
  Filled 2015-09-15 (×9): qty 12

## 2015-09-15 MED ORDER — HYDROCODONE-ACETAMINOPHEN 7.5-325 MG PO TABS
1.0000 | ORAL_TABLET | Freq: Two times a day (BID) | ORAL | Status: DC | PRN
  Administered 2015-09-15 – 2015-09-18 (×6): 1 via ORAL
  Filled 2015-09-15 (×6): qty 1

## 2015-09-15 MED ORDER — FEBUXOSTAT 40 MG PO TABS
40.0000 mg | ORAL_TABLET | Freq: Every day | ORAL | Status: DC
  Administered 2015-09-15 – 2015-09-19 (×5): 40 mg via ORAL
  Filled 2015-09-15 (×5): qty 1

## 2015-09-15 MED ORDER — FENOFIBRATE 54 MG PO TABS
54.0000 mg | ORAL_TABLET | Freq: Every day | ORAL | Status: DC
  Administered 2015-09-15 – 2015-09-19 (×5): 54 mg via ORAL
  Filled 2015-09-15 (×5): qty 1

## 2015-09-15 MED ORDER — COLCHICINE 0.6 MG PO TABS
0.6000 mg | ORAL_TABLET | Freq: Every day | ORAL | Status: DC
  Administered 2015-09-15 – 2015-09-19 (×5): 0.6 mg via ORAL
  Filled 2015-09-15 (×5): qty 1

## 2015-09-15 MED ORDER — HEPARIN SODIUM (PORCINE) 5000 UNIT/ML IJ SOLN
5000.0000 [IU] | Freq: Three times a day (TID) | INTRAMUSCULAR | Status: DC
  Administered 2015-09-15 – 2015-09-19 (×13): 5000 [IU] via SUBCUTANEOUS
  Filled 2015-09-15 (×13): qty 1

## 2015-09-15 MED ORDER — ONDANSETRON HCL 4 MG/2ML IJ SOLN: 4.0000 mg | Freq: Four times a day (QID) | INTRAMUSCULAR | Status: DC | PRN

## 2015-09-15 MED ORDER — SODIUM CHLORIDE 0.9 % IJ SOLN
3.0000 mL | Freq: Two times a day (BID) | INTRAMUSCULAR | Status: DC
  Administered 2015-09-15 – 2015-09-18 (×8): 3 mL via INTRAVENOUS

## 2015-09-15 MED ORDER — INSULIN ASPART 100 UNIT/ML ~~LOC~~ SOLN
0.0000 [IU] | Freq: Three times a day (TID) | SUBCUTANEOUS | Status: DC
Start: 2015-09-15 — End: 2015-09-19
  Administered 2015-09-15: 11 [IU] via SUBCUTANEOUS
  Administered 2015-09-16: 7 [IU] via SUBCUTANEOUS
  Administered 2015-09-16: 4 [IU] via SUBCUTANEOUS
  Administered 2015-09-16: 7 [IU] via SUBCUTANEOUS
  Administered 2015-09-17 (×2): 4 [IU] via SUBCUTANEOUS
  Administered 2015-09-17 – 2015-09-18 (×2): 7 [IU] via SUBCUTANEOUS
  Administered 2015-09-18: 4 [IU] via SUBCUTANEOUS
  Administered 2015-09-18: 7 [IU] via SUBCUTANEOUS
  Administered 2015-09-19: 4 [IU] via SUBCUTANEOUS
  Administered 2015-09-19: 11 [IU] via SUBCUTANEOUS
  Administered 2015-09-19: 4 [IU] via SUBCUTANEOUS

## 2015-09-15 MED ORDER — SODIUM CHLORIDE 0.9 % IJ SOLN: 3.0000 mL | INTRAMUSCULAR | Status: DC | PRN

## 2015-09-15 MED ORDER — ACETAMINOPHEN 325 MG PO TABS
650.0000 mg | ORAL_TABLET | ORAL | Status: DC | PRN
  Administered 2015-09-18 (×2): 650 mg via ORAL
  Filled 2015-09-15 (×2): qty 2

## 2015-09-15 MED ORDER — ATORVASTATIN CALCIUM 40 MG PO TABS
40.0000 mg | ORAL_TABLET | Freq: Every day | ORAL | Status: DC
  Administered 2015-09-15 – 2015-09-19 (×5): 40 mg via ORAL
  Filled 2015-09-15 (×5): qty 1

## 2015-09-15 MED ORDER — INSULIN GLARGINE 100 UNIT/ML ~~LOC~~ SOLN
60.0000 [IU] | Freq: Two times a day (BID) | SUBCUTANEOUS | Status: DC
  Administered 2015-09-15 – 2015-09-19 (×8): 60 [IU] via SUBCUTANEOUS
  Filled 2015-09-15 (×9): qty 0.6

## 2015-09-15 MED ORDER — POTASSIUM CHLORIDE CRYS ER 20 MEQ PO TBCR
40.0000 meq | EXTENDED_RELEASE_TABLET | Freq: Two times a day (BID) | ORAL | Status: DC
  Administered 2015-09-15 – 2015-09-19 (×8): 40 meq via ORAL
  Filled 2015-09-15 (×8): qty 2

## 2015-09-15 MED ORDER — SODIUM CHLORIDE 0.9 % IV SOLN: 250.0000 mL | INTRAVENOUS | Status: DC | PRN

## 2015-09-15 NOTE — Progress Notes (Signed)
Pt requesting donut for old healed sore area on mid bottom.  Instructed by Dr. Shirlee Latch to order one for pt.  Donut placed under pt's bottom as instructed.  Pt refused States "Is not comfortable and I want a flat dsg.   Donut cushion removed.   Foam dsg placed instead.  Elzie Sheets,RN.

## 2015-09-15 NOTE — Progress Notes (Signed)
Introduced self to pt as Development worker, international aid 3p-7p.  Call bell at reach and instructed to call for assistance as needed.  Will continue to monitor.  Amanda Pea, Charity fundraiser.

## 2015-09-15 NOTE — H&P (Signed)
Patient ID: DAXTER PAULE MRN: [id number], DOB/AGE: 1951/03/22   Admit date: 09/15/2015   Primary Physician: [name], MD Primary Cardiologist: Dr. Shirlee Latch  Pt. Profile:  [name] is a morbidly obese 64 yo with PMH of severe AS s/p bioprosthetic AVR (27mm Pericardial Robbi Garter) 10/2011, DMII, OSA, HLD, and chronic diastolic HF with last echo on 62/13/0865 showing EF 55-60% with normal RV function presented as direct admission due to concern of acute on chronic diastolic HF  Problem List  Past Medical History  Diagnosis Date  . Blindness of right eye     a. due to amblyopia as child  . Hx MRSA infection     a. thigh abcess 2006  . Anxiety   . Aortic stenosis     a. s/p AVR 11/29/11  . HTN (hypertension)   . HLD (hyperlipidemia)   . Gout   . Chronic diastolic heart failure (HCC)     a. Echo 08/11/2015 LVEF 55-60% with normal RV function  . DM2 (diabetes mellitus, type 2) (HCC)   . OSA on CPAP   . Hepatitis   . Arthritis   . History of acute renal failure     Past Surgical History  Procedure Laterality Date  . Lumbar laminectomy  10/30/1983  . Aortic valve replacement  11/29/2011    Procedure: AORTIC VALVE REPLACEMENT (AVR);  Surgeon: [name], MD;  Location: [facility] OR;  Service: Open Heart Surgery;  Laterality: N/A;  with nitric oxide  . Right heart catheterization N/A 11/28/2011    Procedure: RIGHT HEART CATH;  Surgeon: [name], MD;  Location: [facility] CATH LAB;  Service: Cardiovascular;  Laterality: N/A;  . Cardiac valve replacement    . Incision and drainage abscess Left 08/2005    "upper/inner thigh; that's where the MRSA was"  . Back surgery       Allergies  Allergies  Allergen Reactions  . Other Other (See Comments)    Salt causes "extreme" edema.    HPI  [name] is a morbidly obese 64 yo with PMH of severe AS s/p bioprosthetic AVR (27mm Pericardial Edwards Magna) 10/2011, DMII, OSA, HLD, and chronic diastolic HF with last echo  on 08/11/2015 showing EF 55-60% with normal RV function. His last cardiac catheterization was in January 2012 which showed no evidence of CAD, moderately severe aortic valve stenosis. The day prior to aortic valve replacement procedure he had right heart cath on 11/28/2011 which showed wedge pressure 22, RV pressure 41/2, PA pressure 39/11, cardiac output 6.82, cardiac index 2.49.   The patient was admitted recently from 10/11-10/16 with acute on chronic diastolic heart failure. He was discharged on torsemide 80 mg qAM and 40 mg qPM. Her discharge weight was 393 pounds. Since discharge, he has gained roughly 11 pounds. He was last seen by Dr. Shirlee Latch in the office on 11/10, at which time it was noted he has gained a lot of fluid back, his torsemide was increased to 80 mg twice a day. He has been compliant with the diuretic at home without noticeable drop in the weight. He has significant shortness of breath on minimal exertion. He denies any shortness of breath at rest, orthopnea or paroxysmal nocturnal dyspnea. He had significant increase in lower extremity edema since his recent discharge. He also complained of abdominal distention, however no chest discomfort. Given his morbid obesity, he is largely sedentary, he only gets up to go to the bathroom.  Home health nurse was  checking on the patient on 09/14/2015 when she noted he was severely dyspneic with minimal exertion and had significant lower extremity edema. After contacting heart failure clinic, the patient was instructed to either present to the ED or if he is stable enough he can consider direct admission on the following day. Since he was not having resting dyspnea, he elected to come to the hospital on the following day.     Home Medications  Prior to Admission medications   Medication Sig Start Date End Date Taking? Authorizing Provider  aspirin 81 MG chewable tablet Chew 1 tablet (81 mg total) by mouth daily. 06/21/15   [name],  PA-C  atorvastatin (LIPITOR) 40 MG tablet Take 40 mg by mouth daily at 6 PM.  12/05/14   Historical Provider, MD  colchicine 0.6 MG tablet Take 0.6 mg by mouth daily.  09/01/14   Historical Provider, MD  febuxostat (ULORIC) 40 MG tablet Take 40 mg by mouth daily.     Historical Provider, MD  fenofibrate 54 MG tablet Take 54 mg by mouth daily. 05/04/15   Historical Provider, MD  HYDROcodone-acetaminophen (NORCO) 7.5-325 MG per tablet Take 1 tablet by mouth 2 (two) times daily as needed for moderate pain.  07/23/14   Historical Provider, MD  insulin glargine (LANTUS) 100 UNIT/ML injection Inject 60 Units into the skin 2 (two) times daily. As directed    Historical Provider, MD  lisinopril (PRINIVIL,ZESTRIL) 5 MG tablet Take 1 tablet (5 mg total) by mouth daily. 08/14/15   Janetta Hora, PA-C  metolazone (ZAROXOLYN) 2.5 MG tablet Take 1 tablet (2.5 mg total) by mouth once a week. Take every Friday 30 min prior to your AM Torsemide dose 09/08/15   [name], MD  NOVOLOG 100 UNIT/ML injection Inject 0-26 Units into the skin 3 (three) times daily with meals. Sliding scale, <70 = 0, 70-90 = 6 units, 91-130 = 10 units (base dose), 131-150 = 12 units, 151-200 = 14 units, 201-250 = 16 units, 251-300 = 18 units, 301-350 = 20 units, 351-400 = 22 units, 401-450 = 24 units, >450 = 26 units 05/12/15   Historical Provider, MD  potassium chloride SA (K-DUR,KLOR-CON) 20 MEQ tablet Take 2 tablets (40 mEq total) by mouth 2 (two) times daily. Take 2 extra tabs when you take Metolazone 09/08/15   [name], MD  SSD 1 % cream Apply 1 application topically 2 (two) times daily.  05/13/15   Historical Provider, MD  torsemide (DEMADEX) 20 MG tablet Take 4 tablets (80 mg total) by mouth 2 (two) times daily. 09/08/15   [name], MD    Family History  Family History  Problem Relation Age of Onset  . Heart attack Father   . Cancer Mother     Social History  Social History   Social History  . Marital  Status: Divorced    Spouse Name: N/A  . Number of Children: N/A  . Years of Education: N/A   Occupational History  . retired travelling Engineer, site    Social History Main Topics  . Smoking status: Never Smoker   . Smokeless tobacco: Never Used  . Alcohol Use: Yes     Comment: 02/18/2015 "once or twice/yr I'll have a cocktail"      rare  . Drug Use: No  . Sexual Activity: Not Currently   Other Topics Concern  . Not on file   Social History Narrative     Review of Systems General:  No chills, fever, night sweats or weight changes.  Cardiovascular:  No chest pain, orthopnea, palpitations, paroxysmal nocturnal dyspnea. +dyspnea on exertion, edema Dermatological: No rash, lesions/masses Respiratory: No cough +dyspnea Urologic: No hematuria, dysuria Abdominal:   No nausea, vomiting, diarrhea, bright red blood per rectum, melena, or hematemesis Neurologic:  No visual changes, wkns, changes in mental status. All other systems reviewed and are otherwise negative except as noted above.  Physical Exam  Blood pressure 121/54, pulse 99, temperature 98.2 F (36.8 C), temperature source Oral, resp. rate 18, height 5' 11.5" (1.816 m), weight 404 lb 4.8 oz (183.389 kg), SpO2 90 %.  General: Pleasant, NAD. Morbidly obese Psych: Normal affect. Neuro: Alert and oriented X 3. Moves all extremities spontaneously. HEENT: Normal  Neck: Supple without bruits. Difficult to assess JVD given obesity Lungs:  Resp regular and unlabored. Distant breath sound, difficult to assess fluid status, did not appreciate significant rale.  Heart: RRR no s3, s4, or murmurs. Abdomen: Soft, non-tender, BS + x 4.  Mildly distended Extremities: No clubbing, cyanosis. DP/PT/Radials 2+ and equal bilaterally. 2-3+ pitting edema, cellulitis of RLE  Labs  Troponin (Point of Care Test) No results for input(s): TROPIPOC in the last 72 hours. No results for input(s): CKTOTAL, CKMB, TROPONINI in the last 72 hours. Lab  Results  Component Value Date   WBC 9.3 08/09/2015   HGB 11.7* 08/09/2015   HCT 39.1 08/09/2015   MCV 81.0 08/09/2015   PLT 278 08/09/2015   No results for input(s): NA, K, CL, CO2, BUN, CREATININE, CALCIUM, PROT, BILITOT, ALKPHOS, ALT, AST, GLUCOSE in the last 168 hours.  Invalid input(s): LABALBU Lab Results  Component Value Date   CHOL 158 09/08/2015   HDL 36* 09/08/2015   LDLCALC 88 09/08/2015   TRIG 172* 09/08/2015   Lab Results  Component Value Date   DDIMER 0.40 06/15/2015     Radiology/Studies  No results found.  ECG  No new EKG  Echocardiogram 08/11/2015  LV EF: 55% -  60%  ------------------------------------------------------------------- Indications:   CHF - 428.0.  ------------------------------------------------------------------- History:  PMH: Bovine Aortic Replacement. Congestive heart failure. Risk factors: Hypertension. Diabetes mellitus. Dyslipidemia.  ------------------------------------------------------------------- Study Conclusions  - Left ventricle: The cavity size was normal. Wall thickness was increased in a pattern of moderate LVH. Systolic function was normal. The estimated ejection fraction was in the range of 55% to 60%. Wall motion was normal; there were no regional wall motion abnormalities. The study is not technically sufficient to allow evaluation of LV diastolic function. - Aortic valve: A bioprosthesis was present. Transvalvular velocity was within the normal range. There was no stenosis. There was no regurgitation. Peak velocity (S): 231 cm/s. Mean gradient (S): 13 mm Hg. VTI ratio of LVOT to aortic valve: 0.4. Aortic valve area 1.08 cm^2. - Mitral valve: Structurally normal valve. Transvalvular velocity was within the normal range. There was no evidence for stenosis. There was no regurgitation. - Right ventricle: The cavity size was normal. Wall thickness was normal. Systolic function  was normal. - Tricuspid valve: There was no regurgitation. - Inferior vena cava: The vessel was dilated. The respirophasic diameter changes were in the normal range (>= 50%), consistent with elevated central venous pressure.     ASSESSMENT AND PLAN  1. Acute on chronic diastolic heart failure  - 10 lbs weight gain in last 1 month. Will start Lasix  BID for diuresis, monitor renal function closely  -  Heart failure admission in Apr, Aug and Oct, given recurrent  admission, may need to consider close monitoring of fluid level, his baseline weight tend to mask accumulation of fluid, ?if cardioMEMS would be beneficial in his case  - holding ACEI during aggressive diuresis. Monitor K. Obtain CXR  2. H/o severe AS s/p bioprosthetic AVR (27mm Pericardial Edwards Magna) 10/2011  3. DMII 4. OSA: per pt has not used CPAP for 1 year, may need to reconsider CPAP, can do a trial during this admission 5. HLD  Signed, Azalee Course, Cordelia Poche 09/15/2015, 12:45 PM  Patient seen with PA, agree with the above note.  He has gained considerable weight over the last few days and is more short of breath despite increasing torsemide. He is doing poorly at home, not able to do ADLs.  On exam, he is volume overloaded. - Agree with Lasix 120 mg IV bid.  Will follow UOP and creatinine, may need addition of metolazone tomorrow.  - At discharge, will likely need SNF placement.   [name] 09/15/2015

## 2015-09-16 ENCOUNTER — Other Ambulatory Visit: Payer: Self-pay

## 2015-09-16 ENCOUNTER — Encounter (HOSPITAL_COMMUNITY): Payer: Self-pay | Admitting: *Deleted

## 2015-09-16 LAB — TROPONIN I

## 2015-09-16 LAB — GLUCOSE, CAPILLARY
GLUCOSE-CAPILLARY: 210 mg/dL — AB (ref 65–99)
GLUCOSE-CAPILLARY: 222 mg/dL — AB (ref 65–99)
Glucose-Capillary: 178 mg/dL — ABNORMAL HIGH (ref 65–99)
Glucose-Capillary: 183 mg/dL — ABNORMAL HIGH (ref 65–99)

## 2015-09-16 LAB — BASIC METABOLIC PANEL
Anion gap: 9 (ref 5–15)
BUN: 49 mg/dL — AB (ref 6–20)
CHLORIDE: 97 mmol/L — AB (ref 101–111)
CO2: 31 mmol/L (ref 22–32)
Calcium: 8.8 mg/dL — ABNORMAL LOW (ref 8.9–10.3)
Creatinine, Ser: 1.43 mg/dL — ABNORMAL HIGH (ref 0.61–1.24)
GFR, EST AFRICAN AMERICAN: 58 mL/min — AB (ref >60)
GFR, EST NON AFRICAN AMERICAN: 50 mL/min — AB (ref >60)
Glucose, Bld: 269 mg/dL — ABNORMAL HIGH (ref 65–99)
POTASSIUM: 4.1 mmol/L (ref 3.5–5.1)
SODIUM: 137 mmol/L (ref 135–145)

## 2015-09-16 MED ORDER — METOLAZONE 2.5 MG PO TABS
2.5000 mg | ORAL_TABLET | Freq: Once | ORAL | Status: AC
  Administered 2015-09-16: 2.5 mg via ORAL
  Filled 2015-09-16: qty 1

## 2015-09-16 MED ORDER — ZOLPIDEM TARTRATE 5 MG PO TABS
10.0000 mg | ORAL_TABLET | Freq: Every evening | ORAL | Status: DC | PRN
  Administered 2015-09-16 – 2015-09-18 (×2): 10 mg via ORAL
  Filled 2015-09-16 (×3): qty 2

## 2015-09-16 NOTE — Progress Notes (Signed)
Patient refused CPAP for the night  

## 2015-09-16 NOTE — Evaluation (Signed)
Physical Therapy Evaluation Patient Details Name: Antonio [name] MRN: [id number] DOB: 16-Sep-1951 Today's Date: 09/16/2015   History of Present Illness  Antonio [name] is a 64 y.o. male with a PMH of severe aortic stenosis (s/p AVR 10/2011), T2DM, OSA, HLD, and chronic diastolic HF with Echo 01/2015 LVEF 55% with mild to moderate RV dysfunction. Found to have acute on chronic CHF  Clinical Impression  Pt was able to walk fairly well but is not able to be unattended even to stand due to his edema/instability for LE's.  He is esp struggling with initial standing due to old back pain and stiffness in general.  Very appropriate for SNF stay short term as he is alone and potential for improvement is very good.    Follow Up Recommendations SNF    Equipment Recommendations  Other (comment) (Has equipment waiting for him at supplier)    Recommendations for Other Services       Precautions / Restrictions Precautions Precautions: Fall (telemetry) Restrictions Weight Bearing Restrictions: No      Mobility  Bed Mobility               General bed mobility comments: up when PT entered  Transfers Overall transfer level: Needs assistance Equipment used: 4-wheeled walker;1 person hand held assist (4 wheeled heavy frame walker for stability) Transfers: Sit to/from [employer] Sit to Stand: Min guard;Min assist Stand pivot transfers: Min guard       General transfer comment: assisted pt mainly due to safety concerns and for purpose of assessment  Ambulation/Gait Ambulation/Gait assistance: Min guard Ambulation Distance (Feet): 175 Feet Assistive device: 4-wheeled walker;1 person hand held assist (heavy framed walker) Gait Pattern/deviations: Step-through pattern;Wide base of support;Decreased stride length;Decreased weight shift to left;Drifts right/left Gait velocity: controlled with RW Gait velocity interpretation: Below normal speed for age/gender General Gait  Details: listing to R then midline  Stairs            Wheelchair Mobility    Modified Rankin (Stroke Patients Only)       Balance Overall balance assessment: Needs assistance Sitting-balance support: Feet supported Sitting balance-Leahy Scale: Good   Postural control: Posterior lean Standing balance support: Bilateral upper extremity supported Standing balance-Leahy Scale: Fair                               Pertinent Vitals/Pain Pain Assessment: 0-10 Pain Score: 5  Pain Location: back after walking Pain Descriptors / Indicators: Aching;Dull Pain Intervention(s): Repositioned;Monitored during session    Home Living Family/patient expects to be discharged to:: Skilled nursing facility Living Arrangements: Alone               Additional Comments: Pt usually furniture walks at home.  He was suppose to get a RW and 3n1 last admission but did not receive    Prior Function Level of Independence: Independent with assistive device(s)               Hand Dominance   Dominant Hand: Right    Extremity/Trunk Assessment   Upper Extremity Assessment: Overall WFL for tasks assessed           Lower Extremity Assessment: Generalized weakness      Cervical / Trunk Assessment: Normal  Communication   Communication: No difficulties  Cognition Arousal/Alertness: Awake/alert Behavior During Therapy: WFL for tasks assessed/performed Overall Cognitive Status: Within Functional Limits for tasks assessed  General Comments General comments (skin integrity, edema, etc.): Has dry irritated skin on feet and lower legs, pitting edema    Exercises        Assessment/Plan    PT Assessment Patient needs continued PT services  PT Diagnosis Generalized weakness;Other (comment) (chronic back pain)   PT Problem List Decreased strength;Decreased range of motion;Decreased activity tolerance;Decreased balance;Decreased  mobility;Decreased coordination;Decreased knowledge of use of DME;Cardiopulmonary status limiting activity;Pain;Decreased skin integrity;Obesity  PT Treatment Interventions DME instruction;Gait training;Stair training;Functional mobility training;Therapeutic activities;Therapeutic exercise;Balance training;Neuromuscular re-education;Patient/family education   PT Goals (Current goals can be found in the Care Plan section) Acute Rehab PT Goals Patient Stated Goal: to walk and feel better PT Goal Formulation: With patient Time For Goal Achievement: 09/30/15 Potential to Achieve Goals: Good    Frequency Min 2X/week   Barriers to discharge Decreased caregiver support;Inaccessible home environment 3 stairs one R side rail to enter house    Co-evaluation               End of Session   Activity Tolerance: Patient tolerated treatment well;Patient limited by pain Patient left: in chair;with call bell/phone within reach Nurse Communication: Mobility status         Time: 1110-1143 PT Time Calculation (min) (ACUTE ONLY): 33 min   Charges:   PT Evaluation $Initial PT Evaluation Tier I: 1 Procedure PT Treatments $Gait Training: 8-22 mins   PT G CodesIvar Drape 09-27-2015, 12:51 PM   Samul Dada, PT MS Acute Rehab Dept. Number: [facility] [id number] and [facility] [phone]

## 2015-09-16 NOTE — Progress Notes (Signed)
Patient ID: Antonio [name], male   DOB: [birth year], 64 y.o.   MRN: [id number]   SUBJECTIVE: Patient diuresed some yesterday, weight down about 1 lb.  No dyspnea at rest. Creatinine stable.   Scheduled Meds: . aspirin  81 mg Oral Daily  . atorvastatin  40 mg Oral q1800  . colchicine  0.6 mg Oral Daily  . febuxostat  40 mg Oral Daily  . fenofibrate  54 mg Oral Daily  . furosemide  120 mg Intravenous BID  . heparin  5,000 Units Subcutaneous 3 times per day  . insulin aspart  0-20 Units Subcutaneous TID WC  . insulin glargine  60 Units Subcutaneous BID  . metolazone  2.5 mg Oral Once  . potassium chloride SA  40 mEq Oral BID  . sodium chloride  3 mL Intravenous Q12H   Continuous Infusions:  PRN Meds:.sodium chloride, acetaminophen, HYDROcodone-acetaminophen, ondansetron (ZOFRAN) IV, sodium chloride    Filed Vitals:   09/15/15 2000 09/16/15 0048 09/16/15 0601 09/16/15 0757  BP: 112/61 120/61 115/70 126/79  Pulse: 91 89 87 89  Temp: 98.2 F (36.8 C) 97.5 F (36.4 C) 98 F (36.7 C) 98.2 F (36.8 C)  TempSrc: Oral Oral Oral Oral  Resp: Height:      Weight:   403 lb 8 oz (183.026 kg)   SpO2: 93% 98% 94% 94%    Intake/Output Summary (Last 24 hours) at 09/16/15 0941 Last data filed at 09/16/15 0814  Gross per 24 hour  Intake   1262 ml  Output   2551 ml  Net  -1289 ml    LABS: Basic Metabolic Panel:  Recent Labs  95/28/41 1427 09/16/15 0112  NA 139 137  K 4.0 4.1  CL 97* 97*  CO2 34* 31  GLUCOSE 228* 269*  BUN 49* 49*  CREATININE 1.50* 1.43*  CALCIUM 9.3 8.8*   Liver Function Tests:  Recent Labs  09/15/15 1427  AST 17  ALT 17  ALKPHOS 91  BILITOT 0.4  PROT 7.1  ALBUMIN 3.5   No results for input(s): LIPASE, AMYLASE in the last 72 hours. CBC:  Recent Labs  09/15/15 1427  WBC 11.2*  NEUTROABS 6.8  HGB 11.6*  HCT 38.9*  MCV 80.5  PLT 270   Cardiac Enzymes:  Recent Labs  09/15/15 1427 09/15/15 1919 09/16/15 0112  TROPONINI 0.03  0.03 <0.03   BNP: Invalid input(s): POCBNP D-Dimer: No results for input(s): DDIMER in the last 72 hours. Hemoglobin A1C: No results for input(s): HGBA1C in the last 72 hours. Fasting Lipid Panel: No results for input(s): CHOL, HDL, LDLCALC, TRIG, CHOLHDL, LDLDIRECT in the last 72 hours. Thyroid Function Tests: No results for input(s): TSH, T4TOTAL, T3FREE, THYROIDAB in the last 72 hours.  Invalid input(s): FREET3 Anemia Panel: No results for input(s): VITAMINB12, FOLATE, FERRITIN, TIBC, IRON, RETICCTPCT in the last 72 hours.  RADIOLOGY: Dg Chest Port 1 View  09/15/2015  CLINICAL DATA:  Diastolic heart failure, shortness of Breath EXAM: PORTABLE CHEST - 1 VIEW COMPARISON:  08/10/2015 FINDINGS: Postsurgical changes are again seen. The cardiac shadow is mildly enlarged but stable. Mild interstitial changes are seen consistent with a mild degree of vascular congestion. No frank edema is noted. No effusion or focal infiltrate is noted. Bibasilar scarring is again seen. IMPRESSION: Mild vascular congestion without significant edema. Electronically Signed   By: Alcide Clever M.D.   On: 09/15/2015 15:09    PHYSICAL EXAM General: NAD, obese Neck: JVP 12 cm,  no thyromegaly or thyroid nodule.  Lungs: Crackles at bases bilaterally.  CV: Nondisplaced PMI.  Heart regular S1/S2, no S3/S4, 2/6 early SEM RUSB.  1+ chronic edema to knees.    Abdomen: Soft, nontender, no hepatosplenomegaly, mild distention.  Neurologic: Alert and oriented x 3.  Psych: Normal affect. Extremities: No clubbing or cyanosis.   TELEMETRY: Reviewed telemetry pt in NSR  ASSESSMENT AND PLAN: 64 yo with history of morbid obesity, chronic diastolic CHF, and bioprosthetic AVR presented from home with dyspnea and failure to thrive.  1. Acute on chronic diastolic CHF: Patient remains volume overloaded on exam.  Creatinine stable.  - Continue current IV Lasix, will add dose of metolazone today.  - Would consider him for  Cardiomems device in the future.  2. CKD: Stage III. Follow creatinine closely with diuresis.  3. Bioprosthetic aortic valve: Stable on last echo.  4. OSA: CPAP at night.  5. Disposition: It is getting harder and harder for him to do his ADLs at home.  This seems to be from a combination of CHF and limitation from morbid obesity.  Will have social work and PT assess him for rehab/SNF stay, may need long-term assisted living.   [name] 09/16/2015 9:45 AM

## 2015-09-16 NOTE — Progress Notes (Signed)
Pt is non-compliant with TED hose placement.  Pt educated and states that he has already informed the physician, as well.

## 2015-09-16 NOTE — Progress Notes (Signed)
Pt agitated and refusing the bed alarm.  Primary RN notified.

## 2015-09-16 NOTE — Progress Notes (Signed)
Pt placed on CPAP of 10 with a nasal mask. tol well at this time. 

## 2015-09-16 NOTE — Consult Note (Signed)
   [facility] CM Inpatient Consult   09/16/2015  Antonio [name] October 26, 1951 [id number] Patient was assessed for [facility] for community services. Patient was previously active with [facility].  Met with patient at bedside regarding being restarted with [facility] services. Consent form signed and folder with [facility] information given.  Patient endorses that his primary care provider is Dr. Jefm Petty, [place].  Patient states he is likely to discharge for short term rehab prior to going back home alone.    Of note, [facility] Care Management services does not replace or interfere with any services that are arranged by inpatient case management or social work. For additional questions or referrals please contact: Natividad Brood, RN BSN [facility] Liaison  [phone] business mobile phone

## 2015-09-16 NOTE — Progress Notes (Addendum)
Pt requested medication to assist with HS sleep.  He does not take any medication while home, but has asked for sleep med while here in hospital.  Have requested same from NP, CHF.  Waiting for orders.   1740 Orders received from Cline Crock PA-C,  for Ambien 10mg  qHS PRN for sleep assistance as requested by pt.

## 2015-09-17 ENCOUNTER — Inpatient Hospital Stay (HOSPITAL_COMMUNITY): Payer: Medicare Other

## 2015-09-17 DIAGNOSIS — I739 Peripheral vascular disease, unspecified: Secondary | ICD-10-CM

## 2015-09-17 LAB — BASIC METABOLIC PANEL
ANION GAP: 9 (ref 5–15)
BUN: 45 mg/dL — AB (ref 6–20)
CHLORIDE: 95 mmol/L — AB (ref 101–111)
CO2: 34 mmol/L — ABNORMAL HIGH (ref 22–32)
Calcium: 9.3 mg/dL (ref 8.9–10.3)
Creatinine, Ser: 1.56 mg/dL — ABNORMAL HIGH (ref 0.61–1.24)
GFR calc Af Amer: 52 mL/min — ABNORMAL LOW (ref >60)
GFR, EST NON AFRICAN AMERICAN: 45 mL/min — AB (ref >60)
Glucose, Bld: 177 mg/dL — ABNORMAL HIGH (ref 65–99)
POTASSIUM: 4.2 mmol/L (ref 3.5–5.1)
SODIUM: 138 mmol/L (ref 135–145)

## 2015-09-17 LAB — GLUCOSE, CAPILLARY
GLUCOSE-CAPILLARY: 220 mg/dL — AB (ref 65–99)
Glucose-Capillary: 170 mg/dL — ABNORMAL HIGH (ref 65–99)
Glucose-Capillary: 158 mg/dL — ABNORMAL HIGH (ref 65–99)
Glucose-Capillary: 172 mg/dL — ABNORMAL HIGH (ref 65–99)

## 2015-09-17 MED ORDER — METOLAZONE 2.5 MG PO TABS
2.5000 mg | ORAL_TABLET | Freq: Once | ORAL | Status: AC
  Administered 2015-09-17: 2.5 mg via ORAL
  Filled 2015-09-17: qty 1

## 2015-09-17 NOTE — Progress Notes (Signed)
Patient ID: Antonio [name], male   DOB: 1951/01/03, 64 y.o.   MRN: [id number]   SUBJECTIVE: Patient diuresed well, weight down 4 lbs.  PT recommends SNF.   Scheduled Meds: . aspirin  81 mg Oral Daily  . atorvastatin  40 mg Oral q1800  . colchicine  0.6 mg Oral Daily  . febuxostat  40 mg Oral Daily  . fenofibrate  54 mg Oral Daily  . furosemide  120 mg Intravenous BID  . heparin  5,000 Units Subcutaneous 3 times per day  . insulin aspart  0-20 Units Subcutaneous TID WC  . insulin glargine  60 Units Subcutaneous BID  . metolazone  2.5 mg Oral Once  . potassium chloride SA  40 mEq Oral BID  . sodium chloride  3 mL Intravenous Q12H   Continuous Infusions:  PRN Meds:.sodium chloride, acetaminophen, HYDROcodone-acetaminophen, ondansetron (ZOFRAN) IV, sodium chloride, zolpidem    Filed Vitals:   09/16/15 2103 09/17/15 0527 09/17/15 0533 09/17/15 0900  BP: 122/55  133/68 109/81  Pulse: 82  89 96  Temp: 98 F (36.7 C)  97.5 F (36.4 C) 98.2 F (36.8 C)  TempSrc: Oral  Oral Oral  Resp: Height:      Weight:  399 lb 7.6 oz (181.2 kg)    SpO2: 94%  93% 93%    Intake/Output Summary (Last 24 hours) at 09/17/15 1132 Last data filed at 09/17/15 1018  Gross per 24 hour  Intake    964 ml  Output   2475 ml  Net  -1511 ml    LABS: Basic Metabolic Panel:  Recent Labs  82/95/62 0112 09/17/15 0238  NA 137 138  K 4.1 4.2  CL 97* 95*  CO2 31 34*  GLUCOSE 269* 177*  BUN 49* 45*  CREATININE 1.43* 1.56*  CALCIUM 8.8* 9.3   Liver Function Tests:  Recent Labs  09/15/15 1427  AST 17  ALT 17  ALKPHOS 91  BILITOT 0.4  PROT 7.1  ALBUMIN 3.5   No results for input(s): LIPASE, AMYLASE in the last 72 hours. CBC:  Recent Labs  09/15/15 1427  WBC 11.2*  NEUTROABS 6.8  HGB 11.6*  HCT 38.9*  MCV 80.5  PLT 270   Cardiac Enzymes:  Recent Labs  09/15/15 1427 09/15/15 1919 09/16/15 0112  TROPONINI 0.03 0.03 <0.03   BNP: Invalid input(s):  POCBNP D-Dimer: No results for input(s): DDIMER in the last 72 hours. Hemoglobin A1C: No results for input(s): HGBA1C in the last 72 hours. Fasting Lipid Panel: No results for input(s): CHOL, HDL, LDLCALC, TRIG, CHOLHDL, LDLDIRECT in the last 72 hours. Thyroid Function Tests: No results for input(s): TSH, T4TOTAL, T3FREE, THYROIDAB in the last 72 hours.  Invalid input(s): FREET3 Anemia Panel: No results for input(s): VITAMINB12, FOLATE, FERRITIN, TIBC, IRON, RETICCTPCT in the last 72 hours.  RADIOLOGY: Dg Chest Port 1 View  09/15/2015  CLINICAL DATA:  Diastolic heart failure, shortness of Breath EXAM: PORTABLE CHEST - 1 VIEW COMPARISON:  08/10/2015 FINDINGS: Postsurgical changes are again seen. The cardiac shadow is mildly enlarged but stable. Mild interstitial changes are seen consistent with a mild degree of vascular congestion. No frank edema is noted. No effusion or focal infiltrate is noted. Bibasilar scarring is again seen. IMPRESSION: Mild vascular congestion without significant edema. Electronically Signed   By: Alcide Clever M.D.   On: 09/15/2015 15:09    PHYSICAL EXAM General: NAD, obese Neck: Thick, JVP 10 cm (difficult), no thyromegaly or thyroid  nodule.  Lungs: Crackles at bases bilaterally.  CV: Nondisplaced PMI.  Heart regular S1/S2, no S3/S4, 2/6 early SEM RUSB.  1+ chronic edema to knees.    Abdomen: Soft, nontender, no hepatosplenomegaly, mild distention.  Neurologic: Alert and oriented x 3.  Psych: Normal affect. Extremities: No clubbing or cyanosis.  Blistering on feet, ulcers on toe tips.    TELEMETRY: Reviewed telemetry pt in NSR  ASSESSMENT AND PLAN: 64 yo with history of morbid obesity, chronic diastolic CHF, and bioprosthetic AVR presented from home with dyspnea and failure to thrive.  1. Acute on chronic diastolic CHF: Patient remains volume overloaded on exam.  Creatinine stable.  - Continue current IV Lasix, will add dose of metolazone again today.  -  Would consider him for Cardiomems device in the future.  2. CKD: Stage III. Follow creatinine closely with diuresis.  3. Bioprosthetic aortic valve: Stable on last echo.  4. OSA: CPAP at night.  5. Toe ulcerations: Ulcerations on several toe tips.  Will consult wound care service and will get ABIs to assess for PAD.  6. Disposition: It is getting harder and harder for him to do his ADLs at home.  This seems to be from a combination of CHF and limitation from morbid obesity.  PT has seen, recommend SNF.  Need social work evaluation.   Antonio [name] 09/17/2015 11:32 AM

## 2015-09-17 NOTE — Clinical Social Work Placement (Addendum)
   CLINICAL SOCIAL WORK PLACEMENT  NOTE  Date:  09/17/2015  Patient Details  Name: Antonio [name] MRN: [id number] Date of Birth: Feb 20, 1951  Clinical Social Work is seeking post-discharge placement for this patient at the Skilled  Nursing Facility level of care (*CSW will initial, date and re-position this form in  chart as items are completed):  Yes   Patient/family provided with [facility] Clinical Social Work Department's list of facilities offering this level of care within the geographic area requested by the patient (or if unable, by the patient's family).  Yes   Patient/family informed of their freedom to choose among providers that offer the needed level of care, that participate in Medicare, Medicaid or managed care program needed by the patient, have an available bed and are willing to accept the patient.  Yes   Patient/family informed of [facility]'s ownership interest in [facility] and [facility], as well as of the fact that they are under no obligation to receive care at these facilities.  PASRR submitted to EDS on       PASRR number received on       Existing PASRR number confirmed on 09/17/15     FL2 transmitted to all facilities in geographic area requested by pt/family on 09/17/15     FL2 transmitted to all facilities within larger geographic area on       Patient informed that his/her managed care company has contracts with or will negotiate with certain facilities, including the following:            Patient/family informed of bed offers received.  Patient chooses bed at       Physician recommends and patient chooses bed at      Patient to be transferred to   on  .  Patient to be transferred to facility by Ambulance Sharin Mons)     Patient family notified on   of transfer.  Name of family member notified:        PHYSICIAN Please prepare priority discharge summary, including medications, Please sign FL2, Please prepare prescriptions      Additional Comment:    _______________________________________________ Darylene Price, LCSW 09/17/2015, 3:00 PMm

## 2015-09-17 NOTE — NC FL2 (Signed)
[place] MEDICAID FL2 LEVEL OF CARE SCREENING TOOL     IDENTIFICATION  Patient Name: Antonio [name] Birthdate: 01/08/1951 Sex: male Admission Date (Current Location): 09/15/2015  [facility] and [place] Number: Producer, television/film/video and Address:  [facility] [place], [address], [place], [place] [address]      Provider Number:    Attending Physician Name and Address:  [name], MD  Relative Name and Phone Number:       Current Level of Care: SNF Recommended Level of Care: Skilled Nursing Facility Prior Approval Number:    Date Approved/Denied:   PASRR Number: [id number]  Discharge Plan: SNF    Current Diagnoses: Patient Active Problem List   Diagnosis Date Noted  . Acute on chronic diastolic heart failure (HCC) 09/15/2015  . Chronic diastolic CHF (congestive heart failure) (HCC) 09/08/2015  . S/P aortic valve replacement with bioprosthetic valve 02/22/2015  . Acute on chronic diastolic CHF (congestive heart failure), NYHA class 3 (HCC) 02/18/2015  . Hypotension 01/02/2014  . PAF (paroxysmal atrial fibrillation) (HCC) 12/08/2011  . Sleep apnea 04/30/2011  . ABNORMAL CV (STRESS) TEST 11/15/2010  . Aortic stenosis 09/26/2010  . Hyperlipidemia 12/26/2006  . Gout 12/26/2006  . OBESITY, NOS 12/26/2006  . HYPERTENSION, BENIGN SYSTEMIC 12/26/2006  . EDEMA-LEGS,DUE TO VENOUS OBSTRUCT. 12/26/2006  . OSTEOARTHRITIS OF SPINE, NOS 12/26/2006    Orientation ACTIVITIES/SOCIAL BLADDER RESPIRATION    Self, Time, Situation, Place  Active Continent Normal  BEHAVIORAL SYMPTOMS/MOOD NEUROLOGICAL BOWEL NUTRITION STATUS  Other (Comment) (appropriate)   Continent  (heart healthy)  PHYSICIAN VISITS COMMUNICATION OF NEEDS Height & Weight Skin    Verbally   399 lbs. Normal          AMBULATORY STATUS RESPIRATION    Assist extensive Normal      Personal Care Assistance Level of Assistance               Functional Limitations Info                 SPECIAL CARE FACTORS FREQUENCY                      Additional Factors Info  Code Status, Allergies Code Status Info: full Allergies Info: salt causes extreme edema           Current Medications (09/17/2015): Current Facility-Administered Medications  Medication Dose Route Frequency Provider Last Rate Last Dose  . 0.9 %  sodium chloride infusion  250 mL Intravenous PRN Azalee Course, PA      . acetaminophen (TYLENOL) tablet 650 mg  650 mg Oral Q4H PRN Azalee Course, PA      . aspirin chewable tablet 81 mg  81 mg Oral Daily Azalee Course, PA   81 mg at 09/17/15 1011  . atorvastatin (LIPITOR) tablet 40 mg  40 mg Oral q1800 Azalee Course, PA   40 mg at 09/17/15 1745  . colchicine tablet 0.6 mg  0.6 mg Oral Daily Azalee Course, PA   0.6 mg at 09/17/15 1011  . febuxostat (ULORIC) tablet 40 mg  40 mg Oral Daily Azalee Course, [place]   40 mg at 09/17/15 1011  . fenofibrate tablet 54 mg  54 mg Oral Daily Azalee Course, [place]   54 mg at 09/17/15 1011  . furosemide (LASIX) 120 mg in dextrose 5 % 50 mL IVPB  120 mg Intravenous BID Azalee Course, PA   120 mg at 09/17/15 1745  . heparin injection  5,000 Units  5,000 Units Subcutaneous 3 times per day Azalee Course, PA   5,000 Units at 09/17/15 1348  . HYDROcodone-acetaminophen (NORCO) 7.5-325 MG per tablet 1 tablet  1 tablet Oral BID PRN Azalee Course, PA   1 tablet at 09/17/15 0805  . insulin aspart (novoLOG) injection 0-20 Units  0-20 Units Subcutaneous TID WC Azalee Course, PA   4 Units at 09/17/15 1659  . insulin glargine (LANTUS) injection 60 Units  60 Units Subcutaneous BID Azalee Course, [place]   60 Units at 09/17/15 1011  . ondansetron (ZOFRAN) injection 4 mg  4 mg Intravenous Q6H PRN Azalee Course, PA      . potassium chloride SA (K-DUR,KLOR-CON) CR tablet 40 mEq  40 mEq Oral BID Azalee Course, PA   40 mEq at 09/17/15 1011  . sodium chloride 0.9 % injection 3 mL  3 mL Intravenous Q12H Azalee Course, PA   3 mL at 09/17/15 1107  . sodium chloride 0.9 % injection 3 mL  3 mL Intravenous PRN Azalee Course, PA      .  zolpidem (AMBIEN) tablet 10 mg  10 mg Oral QHS PRN Janetta Hora, PA-C   10 mg at 09/16/15 2255   Do not use this list as official medication orders. Please verify with discharge summary.  Discharge Medications:   Medication List    ASK your doctor about these medications        aspirin 81 MG chewable tablet  Chew 1 tablet (81 mg total) by mouth daily.     atorvastatin 40 MG tablet  Commonly known as:  LIPITOR  Take 40 mg by mouth daily at 6 PM.     colchicine 0.6 MG tablet  Take 0.6 mg by mouth daily.     febuxostat 40 MG tablet  Commonly known as:  ULORIC  Take 40 mg by mouth daily.     fenofibrate 54 MG tablet  Take 54 mg by mouth daily.     HYDROcodone-acetaminophen 7.5-325 MG tablet  Commonly known as:  NORCO  Take 1 tablet by mouth 2 (two) times daily as needed for moderate pain.     insulin glargine 100 UNIT/ML injection  Commonly known as:  LANTUS  Inject 60 Units into the skin 2 (two) times daily. As directed     lisinopril 5 MG tablet  Commonly known as:  PRINIVIL,ZESTRIL  Take 1 tablet (5 mg total) by mouth daily.     metolazone 2.5 MG tablet  Commonly known as:  ZAROXOLYN  Take 1 tablet (2.5 mg total) by mouth once a week. Take every Friday 30 min prior to your AM Torsemide dose     NOVOLOG 100 UNIT/ML injection  Generic drug:  insulin aspart  Inject 30-40 Units into the skin 3 (three) times daily with meals. Sliding scale     potassium chloride SA 20 MEQ tablet  Commonly known as:  K-DUR,KLOR-CON  Take 2 tablets (40 mEq total) by mouth 2 (two) times daily. Take 2 extra tabs when you take Metolazone     SSD 1 % cream  Generic drug:  silver sulfADIAZINE  Apply 1 application topically 2 (two) times daily.     torsemide 20 MG tablet  Commonly known as:  DEMADEX  Take 4 tablets (80 mg total) by mouth 2 (two) times daily.        Relevant Imaging Results:  Relevant Lab Results:  Recent Labs    Additional Information    Darylene Price,  LCSW

## 2015-09-17 NOTE — Clinical Social Work Note (Signed)
Clinical Social Work Assessment  Patient Details  Name: Antonio [name] MRN: [id number] Date of Birth: 1951-03-02  Date of referral:  09/17/15               Reason for consult:  Facility Placement                Permission sought to share information with:  Facility Art therapist granted to share information::  Yes, Verbal Permission Granted  Name::     [place] [facility]    Housing/Transportation Living arrangements for the past 2 months:  Single Family Home Source of Information:  Patient Patient Interpreter Needed:  None Criminal Activity/Legal Involvement Pertinent to Current Situation/Hospitalization:  No - Comment as needed Significant Relationships:  Friend, Other(Comment) (Ex-wives, friends) Lives with:  Self Do you feel safe going back to the place where you live?  Yes (After Rehab) Need for family participation in patient care:  No (Coment)  Care giving concerns:  Patient states that he lives alone and not been able to manage his care at home; unable to control fluid gain and other medical conditions. Recent multiple hospitalizations.   Social Worker assessment / plan: CSW met with this 64 year old male this afternoon to discuss PT's recommendation for short term SNF. Patient was very pleased to meet with this CSW and stated and he is asking of for placement as he has not been able to manage his care at home.  Pateint verbalizes that he has had to be readmitted to the hospital several times recently for the same issues.  He is followed by [facility] but states that he still has not managed well.  Patient indicates that he has limited home support; he has 2 ex-wives and their spouses who helm him when they are able.  He is dependent for transportation and other issues.  Patient's current weight is 399 lbs.   CSW discussed SNF placement process. He is familiar with this as he states he has been a resident of [place] in the past and would like to return  there.  SNF list provided and discussed need for full county search in [place] is unable to offer a bed. He agreed to search but stated that his second preference would be for placement in the [place] area.  Fl2 initiated and will be placed on chart for MD's signature.  Has existing PASARR number.  Employment status:  Disabled (Comment on whether or not currently receiving Disability) Insurance information:  Medicare, Medicaid In [place] PT Recommendations:  [place] / Referral to community resources:  [place]  Patient/Family's Response to care: Patient states he is feeling somewhat better but remains very concerned about extreme swelling in his legs and feet as well as other bodily fluid.    Patient/Family's Understanding of and Emotional Response to Diagnosis, Current Treatment, and Prognosis: Patient verbalizes a strong understanding of his medical condition, current treatment and prognosis.  He has a very positive attitude about the need for short term SNF and is actively seeking placement. He is very hopeful that he can be placed at [facility] as he is familiar with this facility.    Emotional Assessment Appearance:  Appears stated age Attitude/Demeanor/Rapport:   (Responsive, cooperative, involved and engaged) Affect (typically observed):  Appropriate, Pleasant, Accepting Orientation:  Oriented to Self, Oriented to Place, Oriented to  Time, Oriented to Situation Alcohol / Substance use:  Alcohol Use Psych involvement (Current and /or in the  community):  No (Comment)  Discharge Needs  Concerns to be addressed:  Discharge Planning Concerns Readmission within the last 30 days:  No Current discharge risk:  Lives alone, Physical Impairment Barriers to Discharge:  Continued Medical Work up   Estill Bakes 09/17/2015, 2:45 PM

## 2015-09-17 NOTE — Progress Notes (Signed)
VASCULAR LAB PRELIMINARY  ARTERIAL  ABI completed: Right ABI mild arterial disease. Left ABI within normal limits, however may be falsely elevated due to calcified vessels.    RIGHT    LEFT    PRESSURE WAVEFORM  PRESSURE WAVEFORM  BRACHIAL 130 Tri BRACHIAL 122 Tri  DP   DP    AT 120 Tri AT 155 Tri  PT  Tri PT 170 Tri  PER   PER    GREAT TOE  NA GREAT TOE  NA    RIGHT LEFT  ABI 0.92 1.31     Farrel Demark, RDMS, RVT  09/17/2015, 2:55 PM

## 2015-09-18 DIAGNOSIS — N183 Chronic kidney disease, stage 3 (moderate): Secondary | ICD-10-CM

## 2015-09-18 LAB — GLUCOSE, CAPILLARY
GLUCOSE-CAPILLARY: 198 mg/dL — AB (ref 65–99)
GLUCOSE-CAPILLARY: 235 mg/dL — AB (ref 65–99)
GLUCOSE-CAPILLARY: 241 mg/dL — AB (ref 65–99)
Glucose-Capillary: 241 mg/dL — ABNORMAL HIGH (ref 65–99)

## 2015-09-18 LAB — BASIC METABOLIC PANEL
Anion gap: 14 (ref 5–15)
BUN: 49 mg/dL — AB (ref 6–20)
CHLORIDE: 97 mmol/L — AB (ref 101–111)
CO2: 24 mmol/L (ref 22–32)
Calcium: 9 mg/dL (ref 8.9–10.3)
Creatinine, Ser: 1.68 mg/dL — ABNORMAL HIGH (ref 0.61–1.24)
GFR calc Af Amer: 48 mL/min — ABNORMAL LOW (ref >60)
GFR calc non Af Amer: 41 mL/min — ABNORMAL LOW (ref >60)
Glucose, Bld: 198 mg/dL — ABNORMAL HIGH (ref 65–99)
POTASSIUM: 4.7 mmol/L (ref 3.5–5.1)
SODIUM: 135 mmol/L (ref 135–145)

## 2015-09-18 MED ORDER — HYDROCODONE-ACETAMINOPHEN 7.5-325 MG PO TABS
1.0000 | ORAL_TABLET | Freq: Four times a day (QID) | ORAL | Status: DC | PRN
  Administered 2015-09-18 – 2015-09-19 (×4): 1 via ORAL
  Filled 2015-09-18 (×4): qty 1

## 2015-09-18 MED ORDER — METOLAZONE 5 MG PO TABS
5.0000 mg | ORAL_TABLET | Freq: Once | ORAL | Status: AC
  Administered 2015-09-18: 5 mg via ORAL
  Filled 2015-09-18: qty 1

## 2015-09-18 MED ORDER — BISACODYL 5 MG PO TBEC
5.0000 mg | DELAYED_RELEASE_TABLET | Freq: Every day | ORAL | Status: DC | PRN
  Administered 2015-09-19: 5 mg via ORAL
  Filled 2015-09-18 (×2): qty 1

## 2015-09-18 MED ORDER — HYDROCERIN EX CREA
TOPICAL_CREAM | Freq: Two times a day (BID) | CUTANEOUS | Status: DC
  Administered 2015-09-18 – 2015-09-19 (×2): via TOPICAL
  Filled 2015-09-18: qty 113

## 2015-09-18 MED ORDER — DOCUSATE SODIUM 100 MG PO CAPS
100.0000 mg | ORAL_CAPSULE | Freq: Two times a day (BID) | ORAL | Status: DC
  Administered 2015-09-18 – 2015-09-19 (×3): 100 mg via ORAL
  Filled 2015-09-18 (×3): qty 1

## 2015-09-18 NOTE — Progress Notes (Signed)
During report pt c/o inability to pass stool, pt states he is impacted. Day shift RN notified MD on call and ordered that RN may disimpact pt and may give soap suds enema. Day shift RN attempted to disimpact pt, pt then able to pass large stool without enema.

## 2015-09-18 NOTE — Progress Notes (Signed)
Patient refused CPAP tonight. There Is a machine in the room at this time. RN aware. Explained to Patient that if they changed their mind, to just have the RN call Respiratory and we would come set them up. 

## 2015-09-18 NOTE — Progress Notes (Signed)
Patient ID: Antonio [name], male   DOB: [birth year], 64 y.o.   MRN: [id number]   SUBJECTIVE: Patient diuresed reasonably yesterday.  PT recommends SNF.   Scheduled Meds: . aspirin  81 mg Oral Daily  . atorvastatin  40 mg Oral q1800  . colchicine  0.6 mg Oral Daily  . docusate sodium  100 mg Oral BID  . febuxostat  40 mg Oral Daily  . fenofibrate  54 mg Oral Daily  . furosemide  120 mg Intravenous BID  . heparin  5,000 Units Subcutaneous 3 times per day  . insulin aspart  0-20 Units Subcutaneous TID WC  . insulin glargine  60 Units Subcutaneous BID  . metolazone  5 mg Oral Once  . potassium chloride SA  40 mEq Oral BID  . sodium chloride  3 mL Intravenous Q12H   Continuous Infusions:  PRN Meds:.sodium chloride, acetaminophen, bisacodyl, HYDROcodone-acetaminophen, ondansetron (ZOFRAN) IV, sodium chloride, zolpidem    Filed Vitals:   09/17/15 1652 09/17/15 2041 09/18/15 0557 09/18/15 0812  BP: 119/52 147/53 122/65 122/62  Pulse: 87 87 86 88  Temp: 98.3 F (36.8 C) 97.7 F (36.5 C) 97.7 F (36.5 C) 97.5 F (36.4 C)  TempSrc: Oral Oral Oral Oral  Resp: Height:      Weight:   399 lb 1.6 oz (181.031 kg)   SpO2: 93% 91% 94% 94%    Intake/Output Summary (Last 24 hours) at 09/18/15 1024 Last data filed at 09/18/15 0934  Gross per 24 hour  Intake   1722 ml  Output   3650 ml  Net  -1928 ml    LABS: Basic Metabolic Panel:  Recent Labs  19/14/78 0238 09/18/15 0300  NA 138 135  K 4.2 4.7  CL 95* 97*  CO2 34* 24  GLUCOSE 177* 198*  BUN 45* 49*  CREATININE 1.56* 1.68*  CALCIUM 9.3 9.0   Liver Function Tests:  Recent Labs  09/15/15 1427  AST 17  ALT 17  ALKPHOS 91  BILITOT 0.4  PROT 7.1  ALBUMIN 3.5   No results for input(s): LIPASE, AMYLASE in the last 72 hours. CBC:  Recent Labs  09/15/15 1427  WBC 11.2*  NEUTROABS 6.8  HGB 11.6*  HCT 38.9*  MCV 80.5  PLT 270   Cardiac Enzymes:  Recent Labs  09/15/15 1427 09/15/15 1919  09/16/15 0112  TROPONINI 0.03 0.03 <0.03   BNP: Invalid input(s): POCBNP D-Dimer: No results for input(s): DDIMER in the last 72 hours. Hemoglobin A1C: No results for input(s): HGBA1C in the last 72 hours. Fasting Lipid Panel: No results for input(s): CHOL, HDL, LDLCALC, TRIG, CHOLHDL, LDLDIRECT in the last 72 hours. Thyroid Function Tests: No results for input(s): TSH, T4TOTAL, T3FREE, THYROIDAB in the last 72 hours.  Invalid input(s): FREET3 Anemia Panel: No results for input(s): VITAMINB12, FOLATE, FERRITIN, TIBC, IRON, RETICCTPCT in the last 72 hours.  RADIOLOGY: Dg Chest Port 1 View  09/15/2015  CLINICAL DATA:  Diastolic heart failure, shortness of Breath EXAM: PORTABLE CHEST - 1 VIEW COMPARISON:  08/10/2015 FINDINGS: Postsurgical changes are again seen. The cardiac shadow is mildly enlarged but stable. Mild interstitial changes are seen consistent with a mild degree of vascular congestion. No frank edema is noted. No effusion or focal infiltrate is noted. Bibasilar scarring is again seen. IMPRESSION: Mild vascular congestion without significant edema. Electronically Signed   By: Alcide Clever M.D.   On: 09/15/2015 15:09    PHYSICAL EXAM General: NAD, obese Neck:  Thick, JVP 8-9 cm (difficult), no thyromegaly or thyroid nodule.  Lungs: Crackles at bases bilaterally.  CV: Nondisplaced PMI.  Heart regular S1/S2, no S3/S4, 2/6 early SEM RUSB.  1+ chronic edema to knees.    Abdomen: Soft, nontender, no hepatosplenomegaly, mild distention.  Neurologic: Alert and oriented x 3.  Psych: Normal affect. Extremities: No clubbing or cyanosis.  Blistering on feet, ulcers on toe tips.    TELEMETRY: Reviewed telemetry pt in NSR  ASSESSMENT AND PLAN: 64 yo with history of morbid obesity, chronic diastolic CHF, and bioprosthetic AVR presented from home with dyspnea and failure to thrive.  1. Acute on chronic diastolic CHF: Patient remains volume overloaded on exam but improved.  Creatinine  mildly higher.  - Continue current IV Lasix, will add dose of metolazone again today. Probably convert to torsemide po tomorrow. - Would consider him for Cardiomems device in the future.  2. CKD: Stage III. Follow creatinine closely with diuresis, mild rise. As above, probably to po diuretics tomorrow.  3. Bioprosthetic aortic valve: Stable on last echo.  4. OSA: CPAP at night.  5. Toe ulcerations: Ulcerations on several toe tips.  ABI mildly decreased on the right.  He is statin.  Will consult wound care service.  6. Disposition: It is getting harder and harder for him to do his ADLs at home.  This seems to be from a combination of CHF and limitation from morbid obesity.  PT has seen, recommend SNF.  Could go tomorrow if bed available.   Antonio [name] 09/18/2015 10:24 AM

## 2015-09-18 NOTE — Consult Note (Signed)
WOC wound consult note Reason for Consult: Bilateral wounds and skin issues on feet.  Wound type: venous insufficiency, trauma Pressure Ulcer POA: No Measurement:No open wounds Wound OTR:RNHA Drainage (amount, consistency, odor) None Periwound:Skin is dry, peeling at heels few, partial thickness areas that has recently reepithelialized Dressing procedure/placement/frequency: Patient was seen by Dr. Pattricia Boss at the outpatient wound care center at [facility] last year, seen there periodically in follow up and there are currently no wounds.  Last recommended treatment was daily moisturizing, but patient cannot apply emollients to his feet as he cannot reach them due to body habitus. I will begin a POC consistent with that and provide nursing with guidance for daily cleansing and application of Eucerin cream.  Patient wil need to elevate his LEs as able (currently can only do when back pain is controlled). He will go to rehab following this admission and perhaps the care to the LEs can continue there or OT can devise a way for patient to perform self care. Patient should wear non-skid slipper-socks when OOB to reduce risk of fall. WOC nursing team will not follow, but will remain available to this patient, the nursing and medical teams.  Please re-consult if needed. Thanks, Ladona Mow, MSN, RN, GNP, [place]  Pager# [phone]

## 2015-09-18 NOTE — Care Management Important Message (Signed)
Important Message  Patient Details  Name: Antonio [name] MRN: [id number] Date of Birth: 1951-02-11   Medicare Important Message Given:  Yes    Qiara Minetti P Shakenya Stoneberg 09/18/2015, 9:55 AM

## 2015-09-18 NOTE — Progress Notes (Signed)
Patient reCPAP at thias time. Will call if changes his mind

## 2015-09-19 ENCOUNTER — Encounter (HOSPITAL_COMMUNITY): Payer: Medicare Other

## 2015-09-19 LAB — BASIC METABOLIC PANEL
ANION GAP: 9 (ref 5–15)
BUN: 50 mg/dL — AB (ref 6–20)
CALCIUM: 9.1 mg/dL (ref 8.9–10.3)
CO2: 33 mmol/L — ABNORMAL HIGH (ref 22–32)
CREATININE: 1.68 mg/dL — AB (ref 0.61–1.24)
Chloride: 94 mmol/L — ABNORMAL LOW (ref 101–111)
GFR calc Af Amer: 48 mL/min — ABNORMAL LOW (ref >60)
GFR, EST NON AFRICAN AMERICAN: 41 mL/min — AB (ref >60)
GLUCOSE: 213 mg/dL — AB (ref 65–99)
Potassium: 3.6 mmol/L (ref 3.5–5.1)
Sodium: 136 mmol/L (ref 135–145)

## 2015-09-19 LAB — GLUCOSE, CAPILLARY
Glucose-Capillary: 197 mg/dL — ABNORMAL HIGH (ref 65–99)
Glucose-Capillary: 276 mg/dL — ABNORMAL HIGH (ref 65–99)
Glucose-Capillary: 154 mg/dL — ABNORMAL HIGH (ref 65–99)

## 2015-09-19 MED ORDER — METOLAZONE 2.5 MG PO TABS: 2.5000 mg | ORAL_TABLET | ORAL | Status: DC

## 2015-09-19 MED ORDER — HYDROCODONE-ACETAMINOPHEN 7.5-325 MG PO TABS: 1.0000 | ORAL_TABLET | Freq: Two times a day (BID) | ORAL | Status: DC | PRN

## 2015-09-19 MED ORDER — METOLAZONE 2.5 MG PO TABS
2.5000 mg | ORAL_TABLET | ORAL | Status: DC
  Administered 2015-09-19: 2.5 mg via ORAL
  Filled 2015-09-19: qty 1

## 2015-09-19 MED ORDER — HYDROCERIN EX CREA: 1.0000 "application " | TOPICAL_CREAM | Freq: Two times a day (BID) | CUTANEOUS | Status: DC

## 2015-09-19 MED ORDER — INSULIN ASPART 100 UNIT/ML ~~LOC~~ SOLN
4.0000 [IU] | Freq: Three times a day (TID) | SUBCUTANEOUS | Status: DC
  Administered 2015-09-19: 4 [IU] via SUBCUTANEOUS

## 2015-09-19 MED ORDER — TORSEMIDE 20 MG PO TABS
80.0000 mg | ORAL_TABLET | Freq: Two times a day (BID) | ORAL | Status: DC
  Administered 2015-09-19: 80 mg via ORAL
  Filled 2015-09-19: qty 4

## 2015-09-19 MED ORDER — DOCUSATE SODIUM 100 MG PO CAPS: 100.0000 mg | ORAL_CAPSULE | Freq: Two times a day (BID) | ORAL | Status: DC

## 2015-09-19 NOTE — Progress Notes (Signed)
Inpatient Diabetes Program Recommendations  AACE/ADA: New Consensus Statement on Inpatient Glycemic Control (2015)  Target Ranges:  Prepandial:   less than 140 mg/dL      Peak postprandial:   less than 180 mg/dL (1-2 hours)      Critically ill patients:  140 - 180 mg/dL   Review of Glycemic Control  Diabetes history: DM 2 Outpatient Diabetes medications: Lantus 60 units BID, Novolog 30-40 TID Current orders for Inpatient glycemic control: Lantus 60 units BID, Novolog Reisistant TID  Inpatient Diabetes Program Recommendations: Insulin - Meal Coverage: Glucose increases into the mid 200's at meal times. Please consider ordering Novolog 4 units TID with meals.  Thanks,  Christena Deem RN, MSN, [facility] Inpatient Diabetes Coordinator Team Pager [phone] (8a-5p)

## 2015-09-19 NOTE — Progress Notes (Signed)
Heart Failure Navigator Consult Note  Presentation: Antonio [name] is a morbidly obese 64 yo with PMH of severe AS s/p bioprosthetic AVR (72mm Pericardial Antonio [name]) 10/2011, DMII, OSA, HLD, and chronic diastolic HF with last echo on 83/81/8403 showing EF 55-60% with normal RV function presented as direct admission due to concern of acute on chronic diastolic HF.  Past Medical History  Diagnosis Date  . Blindness of right eye     a. due to amblyopia as child  . Hx MRSA infection     a. thigh abcess 2006  . Anxiety   . Aortic stenosis     a. s/p AVR 11/29/11  . HTN (hypertension)   . HLD (hyperlipidemia)   . Gout   . Chronic diastolic heart failure (HCC)     a. Echo 08/11/2015 LVEF 55-60% with normal RV function  . DM2 (diabetes mellitus, type 2) (HCC)   . OSA on CPAP   . Hepatitis   . Arthritis   . History of acute renal failure     Social History   Social History  . Marital Status: Divorced    Spouse Name: N/A  . Number of Children: N/A  . Years of Education: N/A   Occupational History  . retired travelling Engineer, site    Social History Main Topics  . Smoking status: Never Smoker   . Smokeless tobacco: Never Used  . Alcohol Use: Yes     Comment: 02/18/2015 "once or twice/yr I'll have a cocktail"      rare  . Drug Use: Yes    Special: Other-see comments  . Sexual Activity: Not Currently   Other Topics Concern  . None   Social History Narrative    ECHO:Study Conclusions--08/11/15  - Left ventricle: The cavity size was normal. Wall thickness was increased in a pattern of moderate LVH. Systolic function was normal. The estimated ejection fraction was in the range of 55% to 60%. Wall motion was normal; there were no regional wall motion abnormalities. The study is not technically sufficient to allow evaluation of LV diastolic function. - Aortic valve: A bioprosthesis was present. Transvalvular velocity was within the normal range. There was no  stenosis. There was no regurgitation. Peak velocity (S): 231 cm/s. Mean gradient (S): 13 mm Hg. VTI ratio of LVOT to aortic valve: 0.4. Aortic valve area 1.08 cm^2. - Mitral valve: Structurally normal valve. Transvalvular velocity was within the normal range. There was no evidence for stenosis. There was no regurgitation. - Right ventricle: The cavity size was normal. Wall thickness was normal. Systolic function was normal. - Tricuspid valve: There was no regurgitation. - Inferior vena cava: The vessel was dilated. The respirophasic diameter changes were in the normal range (>= 50%), consistent with elevated central venous pressure.  Transthoracic echocardiography. M-mode, complete 2D, spectral Doppler, and color Doppler. Birthdate: Patient birthdate: 10/02/1951. Age: Patient is 64 yr old. Sex: Gender: male. BMI: 55.4 kg/m^2. Blood pressure:   122/69 Patient status: Inpatient. Study date: Study date: 08/11/2015. Study time: 10:13 AM. Location: Bedside.  BNP    Component Value Date/Time   BNP 20.0 09/15/2015 1427    ProBNP    Component Value Date/Time   PROBNP 42.0 03/25/2014 1258     Education Assessment and Provision:  Detailed education and instructions provided on heart failure disease management including the following:  Signs and symptoms of Heart Failure When to call the physician Importance of daily weights Low sodium diet Fluid restriction Medication management Anticipated future follow-up  appointments  Patient education given on each of the above topics.  Patient acknowledges understanding and acceptance of all instructions.  I spoke briefly with [name] regarding his recent hospitalization and HF.  I have seen and educated him before during other hospitalizations.  He tells me that he is in need of a scale --I will order one that is "talking" and can accommodate his weight.  He lives alone and has agreed to be discharged to SNF  after this admission.  He admits that he relies on food that is "brought in" and/or ordered at home--therefore mostly eats high sodium meals.  He says that they "try to buy groceries when possible that are low sodium" and he never adds salt to meals or foods.  I reinforced the importance of a low sodium diet and high sodium foods to avoid.  He denies any issues with getting or taking medications.  He will follow in the AHF Clinic after discharge.  Education Materials:  "Living Better With Heart Failure" Booklet, Daily Weight Tracker Tool   High Risk Criteria for Readmission and/or Poor Patient Outcomes:   EF <30%- No--55-60%  2 or more admissions in 6 months- 3/6  Difficult social situation- yes- lives alone and has minimal support   Demonstrates medication noncompliance- No denies    Barriers of Care:  Knowledge , compliance and ability to provide his own self care  Discharge Planning:   Plans to discharge to SNF and then eventually return to home alone.  He will benefit from [facility] and possibly Paramedicine after SNF discharge.

## 2015-09-19 NOTE — Clinical Social Work Placement (Signed)
   CLINICAL SOCIAL WORK PLACEMENT  NOTE  Date:  09/19/2015  Patient Details  Name: Antonio [name] MRN: [id number] Date of Birth: 1951/08/11  Clinical Social Work is seeking post-discharge placement for this patient at the Skilled  Nursing Facility level of care (*CSW will initial, date and re-position this form in  chart as items are completed):  Yes   Patient/family provided with [facility] Clinical Social Work Department's list of facilities offering this level of care within the geographic area requested by the patient (or if unable, by the patient's family).  Yes   Patient/family informed of their freedom to choose among providers that offer the needed level of care, that participate in Medicare, Medicaid or managed care program needed by the patient, have an available bed and are willing to accept the patient.  Yes   Patient/family informed of [facility]'s ownership interest in [facility] and [facility], as well as of the fact that they are under no obligation to receive care at these facilities.  PASRR submitted to EDS on       PASRR number received on       Existing PASRR number confirmed on 09/17/15     FL2 transmitted to all facilities in geographic area requested by pt/family on 09/17/15     FL2 transmitted to all facilities within larger geographic area on       Patient informed that his/her managed care company has contracts with or will negotiate with certain facilities, including the following:        Yes   Patient/family informed of bed offers received.  Patient chooses bed at [facility]     Physician recommends and patient chooses bed at      Patient to be transferred to   on 09/19/15.  Patient to be transferred to facility by Ambulance [facility])     Patient family notified on 09/19/15 (Patient aware and will notify his support systems) of transfer.  Name of family member notified:      NA  PHYSICIAN Please prepare priority discharge summary,  including medications, Please sign FL2, Please prepare prescriptions     Additional Comment: Ok per MD for d/c today to SNF for continued care and rehab. Patient is very pleased with [facility] and they can provide him with a private room.  Patient notified of the $20.00 per day extra room charge.  Facility has initiated [place] with [employer] which is secondary to [employer].  Nursing notified to call report.  Will send d/c summary to facility.  Patient is alert, oriented and indicated that he would notify his "family of his d/c. No further CSW needs identified. CSW signing off.  Lorri Frederick. Andria Rhein [phone]      _______________________________________________ Darylene Price, LCSW 09/19/2015, 3:27 PM

## 2015-09-19 NOTE — Progress Notes (Signed)
Patient ID: Antonio [name], male   DOB: 1951-09-16, 64 y.o.   MRN: [id number]   SUBJECTIVE: Feels great. No complaints.  Moving around better. Cr stable.  Out 2.5 L, and down 4 lbs on IV lasix 120 mg BID with metolazone. Feeling much better overall after large bowel movement.   Scheduled Meds: . aspirin  81 mg Oral Daily  . atorvastatin  40 mg Oral q1800  . colchicine  0.6 mg Oral Daily  . docusate sodium  100 mg Oral BID  . febuxostat  40 mg Oral Daily  . fenofibrate  54 mg Oral Daily  . furosemide  120 mg Intravenous BID  . heparin  5,000 Units Subcutaneous 3 times per day  . hydrocerin   Topical BID  . insulin aspart  0-20 Units Subcutaneous TID WC  . insulin glargine  60 Units Subcutaneous BID  . potassium chloride SA  40 mEq Oral BID  . sodium chloride  3 mL Intravenous Q12H   Continuous Infusions:  PRN Meds:.sodium chloride, acetaminophen, bisacodyl, HYDROcodone-acetaminophen, ondansetron (ZOFRAN) IV, sodium chloride, zolpidem    Filed Vitals:   09/18/15 0557 09/18/15 0812 09/18/15 2145 09/19/15 0421  BP: 122/65 122/62 121/54 141/69  Pulse: 86 88 81 87  Temp: 97.7 F (36.5 C) 97.5 F (36.4 C) 98.1 F (36.7 C) 97.5 F (36.4 C)  TempSrc: Oral Oral Oral Oral  Resp: Height:      Weight: 399 lb 1.6 oz (181.031 kg)   395 lb 12.8 oz (179.534 kg)  SpO2: 94% 94% 92% 93%    Intake/Output Summary (Last 24 hours) at 09/19/15 0754 Last data filed at 09/19/15 0737  Gross per 24 hour  Intake   1396 ml  Output   3825 ml  Net  -2429 ml    LABS: Basic Metabolic Panel:  Recent Labs  40/98/11 0300 09/19/15 0502  NA 135 136  K 4.7 3.6  CL 97* 94*  CO2 24 33*  GLUCOSE 198* 213*  BUN 49* 50*  CREATININE 1.68* 1.68*  CALCIUM 9.0 9.1   RADIOLOGY: Dg Chest Port 1 View  09/15/2015  CLINICAL DATA:  Diastolic heart failure, shortness of Breath EXAM: PORTABLE CHEST - 1 VIEW COMPARISON:  08/10/2015 FINDINGS: Postsurgical changes are again seen. The cardiac shadow  is mildly enlarged but stable. Mild interstitial changes are seen consistent with a mild degree of vascular congestion. No frank edema is noted. No effusion or focal infiltrate is noted. Bibasilar scarring is again seen. IMPRESSION: Mild vascular congestion without significant edema. Electronically Signed   By: Alcide Clever M.D.   On: 09/15/2015 15:09    PHYSICAL EXAM General: NAD, obese Neck: Thick, JVP 8-9 cm (difficult), no thyromegaly or lymphadenopathy.  Lungs: Mild basilar crackles CV: Nondisplaced PMI.  Heart regular S1/S2, no S3/S4, 2/6 early SEM RUSB.  1+ chronic edema to knees.    Abdomen: Soft, NT, no HSM, mildly distended  Neurologic: Alert and oriented x 3.  Psych: Normal affect. Extremities: No clubbing or cyanosis.  Blistering on feet, ulcers on toe tips.    TELEMETRY: NSR 80s  ASSESSMENT AND PLAN: 64 yo with history of morbid obesity, chronic diastolic CHF, and bioprosthetic AVR presented from home with dyspnea and failure to thrive.  1. Acute on chronic diastolic CHF: Patient remains volume overloaded on exam but improved. Cr stable.  - Received dose of IV lasix this am. Will convert to torsemide for SNF. WIll leave at torsemide 80 mg BID and  increase metolazone to twice a week. Tuesdays and Fridays.  - Would consider him for Cardiomems device in the future.  2. CKD: Stage III. Creatinine stable  3. Bioprosthetic aortic valve: Stable on last echo.  4. OSA: CPAP at night.  5. Toe ulcerations:  - WOC consulted 6. Disposition: - Progressive difficulty with ADLs. Likely combination of CHF and limitation from morbid obesity.  PT recommends SNF.  To SNF today if bed available.    [name] PA-C 09/19/2015 7:54 AM   Patient seen with PA, agree with the above note.  He is doing well today.  Weight down about 10 lbs total.  Can switch to torsemide 80 mg bid with metolazone 2.5 qTuesdays and Fridays.  He can go to SNF when bed is available, followup in 2 wks.    [name] 09/19/2015 11:25 AM

## 2015-09-19 NOTE — Discharge Summary (Signed)
Advanced Heart Failure Team  Discharge Summary   Patient ID: Antonio [name] MRN: [id number], DOB/AGE: 1950-12-05 64 y.o. Admit date: 09/15/2015 D/C date:     09/19/2015   Primary Discharge Diagnoses:  1. Acute on chronic diastolic CHF: Volume status improved. Though with difficult to management would consider for Cardiomems device 2. CKD: Stage III. Creatinine stable  3. Bioprosthetic aortic valve: Stable on recent echo 4. OSA: Stable. Should continue CPAP at night.  5. Toe ulcerations:  WOC consulted, Pt given education for daily cleansing and care.  Hospital Course: [name] is a morbidly obese 64 yo with PMH of severe AS s/p bioprosthetic AVR (27mm Pericardial Robbi Garter) 10/2011, DMII, OSA, HLD, and chronic diastolic HF with last echo on 14/78/2956 showing EF 55-60% with normal RV function presented as direct admission due to concern of acute on chronic diastolic HF after being seen in the clinic 09/08/15 with continued overload despite recent increase in torsemide.  He was admitted for IV diuresis and arrangement of SNF, at least temporarily with decrease in ability to perform ADLs.  He has had several recent admissions for difficult to manage volume overload, requiring IV diuretics to stabilize prior to transition to po meds. This admission, he had good diuresis on IV lasix up to 120 mg BID with 5 mg of metolazone. PT evaluated him and agreed that he is a very appropriate candidate for SNF stay short term with a good chance of improvement.  Overall, he diuresed 9.4 L and out 9 lbs. He will be discharged in stable condition to SNF, with follow up in the HF clinic as below.  Pt would likely benefit from [facility] as well as paramedicine on eventual d/c from SNF.   Discharge Weight Range: 395 lbs Discharge Vitals: Blood pressure 104/60, pulse 78, temperature 97.9 F (36.6 C), temperature source Oral, resp. rate 18, height 5' 11.5" (1.816 m), weight 395 lb 12.8 oz (179.534 kg), SpO2 90  %.  Labs: Lab Results  Component Value Date   WBC 11.2* 09/15/2015   HGB 11.6* 09/15/2015   HCT 38.9* 09/15/2015   MCV 80.5 09/15/2015   PLT 270 09/15/2015    Recent Labs Lab 09/15/15 1427  09/19/15 0502  NA 139  < > 136  K 4.0  < > 3.6  CL 97*  < > 94*  CO2 34*  < > 33*  BUN 49*  < > 50*  CREATININE 1.50*  < > 1.68*  CALCIUM 9.3  < > 9.1  PROT 7.1  --   --   BILITOT 0.4  --   --   ALKPHOS 91  --   --   ALT 17  --   --   AST 17  --   --   GLUCOSE 228*  < > 213*  < > = values in this interval not displayed. Lab Results  Component Value Date   CHOL 158 09/08/2015   HDL 36* 09/08/2015   LDLCALC 88 09/08/2015   TRIG 172* 09/08/2015   BNP (last 3 results)  Recent Labs  08/09/15 1300 09/08/15 1031 09/15/15 1427  BNP 10.4 14.8 20.0    ProBNP (last 3 results) No results for input(s): PROBNP in the last 8760 hours.   Diagnostic Studies/Procedures   No results found.  Discharge Medications      Medication List    TAKE these medications        aspirin 81 MG chewable tablet  Chew 1 tablet (81 mg total) by  mouth daily.     atorvastatin 40 MG tablet  Commonly known as:  LIPITOR  Take 40 mg by mouth daily at 6 PM.     colchicine 0.6 MG tablet  Take 0.6 mg by mouth daily.     docusate sodium 100 MG capsule  Commonly known as:  COLACE  Take 1 capsule (100 mg total) by mouth 2 (two) times daily.     febuxostat 40 MG tablet  Commonly known as:  ULORIC  Take 40 mg by mouth daily.     fenofibrate 54 MG tablet  Take 54 mg by mouth daily.     hydrocerin Crea  Apply 1 application topically 2 (two) times daily.     HYDROcodone-acetaminophen 7.5-325 MG tablet  Commonly known as:  NORCO  Take 1 tablet by mouth 2 (two) times daily as needed for moderate pain.     insulin glargine 100 UNIT/ML injection  Commonly known as:  LANTUS  Inject 60 Units into the skin 2 (two) times daily. As directed     lisinopril 5 MG tablet  Commonly known as:   PRINIVIL,ZESTRIL  Take 1 tablet (5 mg total) by mouth daily.     metolazone 2.5 MG tablet  Commonly known as:  ZAROXOLYN  Take 1 tablet (2.5 mg total) by mouth 2 (two) times a week. Take every Tuesday and Friday 30 min prior to your AM Torsemide dose     NOVOLOG 100 UNIT/ML injection  Generic drug:  insulin aspart  Inject 30-40 Units into the skin 3 (three) times daily with meals. Sliding scale     potassium chloride SA 20 MEQ tablet  Commonly known as:  K-DUR,KLOR-CON  Take 2 tablets (40 mEq total) by mouth 2 (two) times daily. Take 2 extra tabs when you take Metolazone     SSD 1 % cream  Generic drug:  silver sulfADIAZINE  Apply 1 application topically 2 (two) times daily.     torsemide 20 MG tablet  Commonly known as:  DEMADEX  Take 4 tablets (80 mg total) by mouth 2 (two) times daily.         Disposition   The patient will be discharged in stable condition to SNF.  Discharge Instructions    AMB Referral to [facility] Care Management    Complete by:  As directed   Reason for consult:  HF exacerbation; transition of care follow up  Diagnoses of:  Heart Failure  Expected date of contact:  1-3 days (reserved for hospital discharges)  Please assign patient to social worker for follow up in a skilled facility.  Patient may discharge to facility this weekend for rehab needs.  HF exacerbations, lives alone, when returning home, 3 hospital admissions within 6 months. Charlesetta Shanks, RN BSN CCM [facility]  [phone] business mobile phone     Diet - low sodium heart healthy    Complete by:  As directed      Heart Failure patients record your daily weight using the same scale at the same time of day    Complete by:  As directed      Increase activity slowly    Complete by:  As directed           Follow-up Information    Follow up with [name], MD On 09/30/2015.   Specialty:  Cardiology   Why:  at 1030 for post hospital follow up. Please bring all  of your medications to your visit. The code for  pt parking is 0009.   Contact information:   [address] [place] [address] [phone]         Duration of Discharge Encounter: Greater than 35 minutes   Signed, [name] PA-C 09/19/2015, 3:21 PM

## 2015-09-21 ENCOUNTER — Encounter: Payer: Self-pay | Admitting: *Deleted

## 2015-09-26 ENCOUNTER — Other Ambulatory Visit: Payer: Self-pay | Admitting: *Deleted

## 2015-09-27 ENCOUNTER — Encounter: Payer: Self-pay | Admitting: *Deleted

## 2015-09-27 NOTE — Patient Outreach (Signed)
[facility]) Care Management  [facility] Social Work  09/27/2015  [name] 1951-10-28 [id number]  Subjective:    "I won't be going home when I leave here, if I leave here, because I can't live alone and there is no one to help care for me".  Objective:   CSW agreed to assist patient with possible discharge planning needs and services, as patient currently resides at [facility] [place] to receive short-term rehabilitative services.  Current Medications:  Current Outpatient Prescriptions  Medication Sig Dispense Refill  . aspirin 81 MG chewable tablet Chew 1 tablet (81 mg total) by mouth daily. 30 tablet   . atorvastatin (LIPITOR) 40 MG tablet Take 40 mg by mouth daily at 6 PM.     . colchicine 0.6 MG tablet Take 0.6 mg by mouth daily.     Marland Kitchen docusate sodium (COLACE) 100 MG capsule Take 1 capsule (100 mg total) by mouth 2 (two) times daily. 10 capsule 0  . febuxostat (ULORIC) 40 MG tablet Take 40 mg by mouth daily.     . fenofibrate 54 MG tablet Take 54 mg by mouth daily.  0  . hydrocerin (EUCERIN) CREA Apply 1 application topically 2 (two) times daily. 113 g 0  . HYDROcodone-acetaminophen (NORCO) 7.5-325 MG tablet Take 1 tablet by mouth 2 (two) times daily as needed for moderate pain. 60 tablet 0  . insulin glargine (LANTUS) 100 UNIT/ML injection Inject 60 Units into the skin 2 (two) times daily. As directed    . lisinopril (PRINIVIL,ZESTRIL) 5 MG tablet Take 1 tablet (5 mg total) by mouth daily. 30 tablet 3  . metolazone (ZAROXOLYN) 2.5 MG tablet Take 1 tablet (2.5 mg total) by mouth 2 (two) times a week. Take every Tuesday and Friday 30 min prior to your AM Torsemide dose 10 tablet 3  . NOVOLOG 100 UNIT/ML injection Inject 30-40 Units into the skin 3 (three) times daily with meals. Sliding scale  0  . potassium chloride SA (K-DUR,KLOR-CON) 20 MEQ tablet Take 2 tablets (40 mEq total) by mouth 2 (two) times daily. Take 2 extra tabs when you take  Metolazone 130 tablet 6  . SSD 1 % cream Apply 1 application topically 2 (two) times daily.   0  . torsemide (DEMADEX) 20 MG tablet Take 4 tablets (80 mg total) by mouth 2 (two) times daily. 180 tablet 6   No current facility-administered medications for this visit.    Functional Status:  In your present state of health, do you have any difficulty performing the following activities: 09/27/2015 09/15/2015  Hearing? N N  Vision? N N  Difficulty concentrating or making decisions? N N  Walking or climbing stairs? Y Y  Dressing or bathing? N N  Doing errands, shopping? Tempie Donning  Preparing Food and eating ? Y -  Using the Toilet? N -  In the past six months, have you accidently leaked urine? N -  Do you have problems with loss of bowel control? N -  Managing your Medications? N -  Managing your Finances? N -  Housekeeping or managing your Housekeeping? Y -    Fall/Depression Screening:  PHQ 2/9 Scores 09/27/2015  PHQ - 2 Score 0    Assessment:   CSW was able to meet with patient today, at [facility] [place] where patient currently resides to receive short-term rehabilitative services, to perform the initial visit, as well as assess and assist with social work needs and services. CSW  introduced self, explained role and types of services provided through [facility]).  CSW further explained to patient that CSW works with Natividad Brood, Hospital Liaison with [facility] that met with patient while hospitalized to explain services. CSW then explained the reason for the visit, indicating that Antonio [name] thought that patient would benefit from social work services and resources to assist with possible discharge planning needs upon release from [facility].  CSW obtained two HIPAA compliant identifiers from patient, which included patient's name and date of birth. Patient reported, "Well, I hate to waste your time, but I  don't know that I will be leaving here".  Patient went on to say that, after three recent attempts at trying to return home to live independently, with the assistance of ex-wives, their spouses and home health services, patient still ended up needing to be re-admitted into the hospital, on three separate occasions, within the last three months.  Patient indicated that he is currently working with the [facility] manager at [facility] to pursue long-term care placement arrangements, either at their facility, or within another long-term care skilled facility.  CSW was able to confirm this information.  No additional social work needs identified at this time.  Plan:   CSW will perform a case closure on patient, as all goals of treatment have been met from social work standpoint and no additional social work needs have been identified at this time. CSW will notify [facility] Liaison with [facility], and also the individual that made the referral, of CSW's plans to close patient's case. CSW will fax a correspondence letter to patient's Primary Care Physician, Dr. Jefm Petty to ensure that Dr. Jeralene Huff is aware of CSW's initial visit with patient today.   CSW will submit a case closure request to Lurline Del, Care Management Assistant with [facility], in the form of an [employer].    Antonio [name], BSW, MSW, LCSW  Licensed Education officer, [facility]  Mailing [place] [address] [place], [place] [address] Physical Address-[address], [place], [place] [name] [address] Toll Free Main # [phone] Fax # [phone] Cell # [phone]  Fax # [phone]  [email or web address]

## 2015-10-04 ENCOUNTER — Encounter (HOSPITAL_COMMUNITY): Payer: Self-pay

## 2015-10-04 ENCOUNTER — Ambulatory Visit (HOSPITAL_COMMUNITY)
Admit: 2015-10-04 | Discharge: 2015-10-04 | Disposition: A | Discharge status: Home / Self Care | Payer: No Typology Code available for payment source | Source: Ambulatory Visit | Attending: Cardiology | Admitting: Cardiology

## 2015-10-04 ENCOUNTER — Telehealth (HOSPITAL_COMMUNITY): Payer: Self-pay | Admitting: *Deleted

## 2015-10-04 VITALS — BP 116/68 | HR 84 | Wt >= 6400 oz

## 2015-10-04 DIAGNOSIS — Z953 Presence of xenogenic heart valve: Secondary | ICD-10-CM

## 2015-10-04 DIAGNOSIS — N183 Chronic kidney disease, stage 3 (moderate): Secondary | ICD-10-CM | POA: Diagnosis not present

## 2015-10-04 DIAGNOSIS — I5032 Chronic diastolic (congestive) heart failure: Secondary | ICD-10-CM | POA: Insufficient documentation

## 2015-10-04 DIAGNOSIS — Z954 Presence of other heart-valve replacement: Secondary | ICD-10-CM | POA: Diagnosis not present

## 2015-10-04 LAB — BASIC METABOLIC PANEL
Anion gap: 13 (ref 5–15)
BUN: 91 mg/dL — ABNORMAL HIGH (ref 6–20)
CHLORIDE: 92 mmol/L — AB (ref 101–111)
CO2: 31 mmol/L (ref 22–32)
Calcium: 9.5 mg/dL (ref 8.9–10.3)
Creatinine, Ser: 2.28 mg/dL — ABNORMAL HIGH (ref 0.61–1.24)
GFR calc Af Amer: 33 mL/min — ABNORMAL LOW (ref >60)
GFR calc non Af Amer: 28 mL/min — ABNORMAL LOW (ref >60)
Glucose, Bld: 255 mg/dL — ABNORMAL HIGH (ref 65–99)
Potassium: 4.5 mmol/L (ref 3.5–5.1)
SODIUM: 136 mmol/L (ref 135–145)

## 2015-10-04 LAB — BRAIN NATRIURETIC PEPTIDE: B NATRIURETIC PEPTIDE 5: 13.5 pg/mL (ref 0.0–100.0)

## 2015-10-04 MED ORDER — POTASSIUM CHLORIDE CRYS ER 20 MEQ PO TBCR: EXTENDED_RELEASE_TABLET | ORAL | Status: DC

## 2015-10-04 MED ORDER — TORSEMIDE 20 MG PO TABS: 100.0000 mg | ORAL_TABLET | Freq: Two times a day (BID) | ORAL | Status: DC

## 2015-10-04 NOTE — Progress Notes (Signed)
Patient ID: Antonio [name], male   DOB: 10/10/1951, 64 y.o.   MRN: [id number] PCP: Dr Kalish ([place]) HF Cardiology: Dr. Alicia Ackert  64 yo with bioprosthetic AVR, chronic diastolic CHF, CKD and morbid obesity presents for CHF clinic followup.  Last echo in 10/16 showed normal bioprosthetic aortic valve and normal EF.  He was  admitted in 10/16 with volume overload and diuresed.  He was sent home on torsemide 80 qam/40 qpm.  He sees a nephrologist in [place].   He was admitted again in 11/16 with acute on chronic diastolic CHF and inability to do ADLs at home.  He was diuresed and discharged to a rehab facility.  He is now on torsemide 80 mg bid and metolazone twice a week.    Weight is down 4 lbs compared to prior appointment.  He is walking with a walker and is doing pool exercises.  Feels like his mobility is getting better.  He has OSA but is unable to tolerate CPAP.    Labs (10/16): K 3.7, creatinine 1.76, hgb 11.7  Labs (11/16): K 3.6, creatinine 1.68 Labs (12/16): K 4.5, creatinine 2.28  PMH: 1. Severe aortic stenosis: s/p bioprosthetic AVR in 1/13.  2. Chronic diastolic CHF: Echo (10/16) with EF 55-60%, bioprosthetic AVR with mean gradient 13 mmHg, normal RV size and systolic function.   3. OSA: Not on CPAP.  4. Type II diabetes 5. Gout  6. CKD 7. Subclinical hypothyroidism 8. LHC (1/12) with normal coronaries. 9. Chronic LBBB 10. Morbid obesity  Family History  Problem Relation Age of Onset  . Heart attack Father   . Cancer Mother    Social History   Social History  . Marital Status: Divorced    Spouse Name: N/A  . Number of Children: N/A  . Years of Education: N/A   Occupational History  . retired travelling school teacher    Social History Main Topics  . Smoking status: Never Smoker   . Smokeless tobacco: Never Used  . Alcohol Use: Yes     Comment: 02/18/2015 "once or twice/yr I'll have a cocktail"      rare  . Drug Use: Yes    Special: Other-see comments  .  Sexual Activity: Not Currently   Other Topics Concern  . None   Social History Narrative   ROS: All systems reviewed and negative except as per HPI  Current Outpatient Prescriptions  Medication Sig Dispense Refill  . aspirin 81 MG chewable tablet Chew 1 tablet (81 mg total) by mouth daily. 30 tablet   . atorvastatin (LIPITOR) 40 MG tablet Take 40 mg by mouth daily at 6 PM.     . colchicine 0.6 MG tablet Take 0.6 mg by mouth daily.     . docusate sodium (COLACE) 100 MG capsule Take 1 capsule (100 mg total) by mouth 2 (two) times daily. 10 capsule 0  . febuxostat (ULORIC) 40 MG tablet Take 40 mg by mouth daily.     . fenofibrate 54 MG tablet Take 54 mg by mouth daily.  0  . hydrocerin (EUCERIN) CREA Apply 1 application topically 2 (two) times daily. 113 g 0  . HYDROcodone-acetaminophen (NORCO) 7.5-325 MG tablet Take 1 tablet by mouth 2 (two) times daily as needed for moderate pain. 60 tablet 0  . insulin glargine (LANTUS) 100 UNIT/ML injection Inject 60 Units into the skin 2 (two) times daily. As directed    . lisinopril (PRINIVIL,ZESTRIL) 5 MG tablet Take 1 tablet (5 mg   total) by mouth daily. 30 tablet 3  . metolazone (ZAROXOLYN) 2.5 MG tablet Take 1 tablet (2.5 mg total) by mouth 2 (two) times a week. Take every Tuesday and Friday 30 min prior to your AM Torsemide dose 10 tablet 3  . NOVOLOG 100 UNIT/ML injection Inject 30-40 Units into the skin 3 (three) times daily with meals. Sliding scale  0  . potassium chloride SA (K-DUR,KLOR-CON) 20 MEQ tablet Take 3 tabs in AM and 2 tabs in PM 130 tablet 6  . SSD 1 % cream Apply 1 application topically 2 (two) times daily.   0  . torsemide (DEMADEX) 20 MG tablet Take 5 tablets (100 mg total) by mouth 2 (two) times daily. 180 tablet 6   No current facility-administered medications for this encounter.   BP 116/68 mmHg  Pulse 84  Wt 402 lb 12 oz (182.686 kg)  SpO2 97% General: NAD Neck: Thick, JVP difficult. no thyromegaly or thyroid nodule.   Lungs: Clear to auscultation bilaterally with normal respiratory effort. CV: Nondisplaced PMI.  Heart regular S1/S2, no S3/S4, 1/6 SEM RUSB.  1+ edema to knees bilaterally.  No carotid bruit.  Normal pedal pulses.  Abdomen: Soft, nontender, no hepatosplenomegaly, no distention.  Skin: Intact without lesions or rashes.  Neurologic: Alert and oriented x 3.  Psych: Normal affect. Extremities: No clubbing or cyanosis.  HEENT: Normal.   Assessment/Plan: 1. Chronic diastolic CHF: [name] is now in a rehab facility.  He still has significant lower extremity edema.  I think that a lot of this is venous insufficiency rather than CHF.  Volume status is somewhat difficult, JVP hard to see.  Initially, I had told him that we would increase torsemide to 100 mg bid, but labs today came back with creatinine up to 2.28 and BUN 92.   - Hold torsemide for 2 days, then back to 80 mg bid.  - Stop metolazone altogether.  - BMET again in 1 week.  - Would like to bring him in for RHC, will discuss this with him over phone.  Need more objective evidence of his fluid status.  2. CKD: Stage III.  See above regarding cutting back on diuretics.   3. Bioprosthetic aortic valve: Stable on last echo.  4. He will continue PT at the rehab facility.   [name] 10/04/2015

## 2015-10-04 NOTE — Telephone Encounter (Signed)
Per Dr Shirlee Latch:   Initially, I had told him that we would increase torsemide to 100 mg bid, but labs today came back with creatinine up to 2.28 and BUN 92.  - Hold torsemide for 2 days, then back to 80 mg bid.  - Stop metolazone altogether.  - BMET again in 1 week.  - Would like to bring him in for RHC, will discuss this with him over phone. Need more objective evidence of his fluid status.   Pt and [facility] aware.  RHC sch for 12/16, instructions reviewed w/pt and faxed to facility

## 2015-10-04 NOTE — Patient Instructions (Signed)
Increase Torsemide to 100 mg Twice daily   Increase Potassium to 60 meq in AM and 40 meq in PM  Your physician recommends that you schedule a follow-up appointment in: 3 weeks

## 2015-10-05 ENCOUNTER — Other Ambulatory Visit (HOSPITAL_COMMUNITY): Payer: Self-pay | Admitting: *Deleted

## 2015-10-05 DIAGNOSIS — I5022 Chronic systolic (congestive) heart failure: Secondary | ICD-10-CM

## 2015-10-06 ENCOUNTER — Telehealth (HOSPITAL_COMMUNITY): Payer: Self-pay

## 2015-10-06 ENCOUNTER — Telehealth (HOSPITAL_COMMUNITY): Payer: Self-pay | Admitting: Cardiology

## 2015-10-06 NOTE — Telephone Encounter (Signed)
Patient left VM on CHF triage that he had some updated info from wound center.  Left return VM that he may leave this info on our confidential triage VM when he calls back and will relay to MD.  Ave Filter

## 2015-10-06 NOTE — Telephone Encounter (Signed)
Patient scheduled for RHC with Dr.McLean on 10/14/15 Cpt [id number] With patients current insurance- Medicare/BCBS No pre cert required

## 2015-10-07 ENCOUNTER — Telehealth (HOSPITAL_COMMUNITY): Payer: Self-pay | Admitting: *Deleted

## 2015-10-07 NOTE — Telephone Encounter (Signed)
Patient called to let us know that Medicare says he has to be released to go home on Sunday. He states that he is no better and can't go home to get the care he needs.  Patient also says that wound specialist came to see him and is going to do the leg wraps and a lymphatic pump twice a week. Autumn Messing has started the appeal process. Told patient that we would help with any part of the appeal process we would be glad to help.  Called Social work and left message to call back if need any help.

## 2015-10-14 ENCOUNTER — Ambulatory Visit (HOSPITAL_COMMUNITY)
Admission: RE | Admit: 2015-10-14 | Discharge: 2015-10-14 | Disposition: A | Discharge status: Home / Self Care | Payer: Medicare Other | Source: Ambulatory Visit | Attending: Cardiology | Admitting: Cardiology

## 2015-10-14 ENCOUNTER — Encounter (HOSPITAL_COMMUNITY): Payer: Self-pay | Admitting: Cardiology

## 2015-10-14 ENCOUNTER — Encounter (HOSPITAL_COMMUNITY)
Admission: RE | Disposition: A | Discharge status: Home / Self Care | Payer: Self-pay | Source: Ambulatory Visit | Attending: Cardiology

## 2015-10-14 DIAGNOSIS — Z953 Presence of xenogenic heart valve: Secondary | ICD-10-CM | POA: Insufficient documentation

## 2015-10-14 DIAGNOSIS — I447 Left bundle-branch block, unspecified: Secondary | ICD-10-CM | POA: Diagnosis not present

## 2015-10-14 DIAGNOSIS — I5030 Unspecified diastolic (congestive) heart failure: Secondary | ICD-10-CM

## 2015-10-14 DIAGNOSIS — Z794 Long term (current) use of insulin: Secondary | ICD-10-CM | POA: Insufficient documentation

## 2015-10-14 DIAGNOSIS — Z8249 Family history of ischemic heart disease and other diseases of the circulatory system: Secondary | ICD-10-CM | POA: Insufficient documentation

## 2015-10-14 DIAGNOSIS — N183 Chronic kidney disease, stage 3 (moderate): Secondary | ICD-10-CM | POA: Diagnosis not present

## 2015-10-14 DIAGNOSIS — Z7982 Long term (current) use of aspirin: Secondary | ICD-10-CM | POA: Insufficient documentation

## 2015-10-14 DIAGNOSIS — M109 Gout, unspecified: Secondary | ICD-10-CM | POA: Diagnosis not present

## 2015-10-14 DIAGNOSIS — E039 Hypothyroidism, unspecified: Secondary | ICD-10-CM | POA: Insufficient documentation

## 2015-10-14 DIAGNOSIS — G4733 Obstructive sleep apnea (adult) (pediatric): Secondary | ICD-10-CM | POA: Insufficient documentation

## 2015-10-14 DIAGNOSIS — E1151 Type 2 diabetes mellitus with diabetic peripheral angiopathy without gangrene: Secondary | ICD-10-CM | POA: Diagnosis not present

## 2015-10-14 DIAGNOSIS — Z6841 Body Mass Index (BMI) 40.0 and over, adult: Secondary | ICD-10-CM | POA: Diagnosis not present

## 2015-10-14 DIAGNOSIS — E1122 Type 2 diabetes mellitus with diabetic chronic kidney disease: Secondary | ICD-10-CM | POA: Diagnosis not present

## 2015-10-14 DIAGNOSIS — I5032 Chronic diastolic (congestive) heart failure: Secondary | ICD-10-CM | POA: Diagnosis present

## 2015-10-14 DIAGNOSIS — I5022 Chronic systolic (congestive) heart failure: Secondary | ICD-10-CM

## 2015-10-14 HISTORY — PX: CARDIAC CATHETERIZATION: SHX172

## 2015-10-14 LAB — BASIC METABOLIC PANEL
ANION GAP: 10 (ref 5–15)
BUN: 56 mg/dL — ABNORMAL HIGH (ref 6–20)
CALCIUM: 9.7 mg/dL (ref 8.9–10.3)
CO2: 36 mmol/L — ABNORMAL HIGH (ref 22–32)
Chloride: 93 mmol/L — ABNORMAL LOW (ref 101–111)
Creatinine, Ser: 1.87 mg/dL — ABNORMAL HIGH (ref 0.61–1.24)
GFR, EST AFRICAN AMERICAN: 42 mL/min — AB (ref >60)
GFR, EST NON AFRICAN AMERICAN: 36 mL/min — AB (ref >60)
Glucose, Bld: 366 mg/dL — ABNORMAL HIGH (ref 65–99)
POTASSIUM: 4.3 mmol/L (ref 3.5–5.1)
Sodium: 139 mmol/L (ref 135–145)

## 2015-10-14 LAB — PROTIME-INR
INR: 1.31 (ref 0.00–1.49)
PROTHROMBIN TIME: 16.4 s — AB (ref 11.6–15.2)

## 2015-10-14 LAB — CBC
HEMATOCRIT: 43 % (ref 39.0–52.0)
HEMOGLOBIN: 13.4 g/dL (ref 13.0–17.0)
MCH: 25 pg — ABNORMAL LOW (ref 26.0–34.0)
MCHC: 31.2 g/dL (ref 30.0–36.0)
MCV: 80.2 fL (ref 78.0–100.0)
Platelets: 242 10*3/uL (ref 150–400)
RBC: 5.36 MIL/uL (ref 4.22–5.81)
RDW: 15.6 % — ABNORMAL HIGH (ref 11.5–15.5)
WBC: 8.5 10*3/uL (ref 4.0–10.5)

## 2015-10-14 LAB — POCT I-STAT 3, VENOUS BLOOD GAS (G3P V)
Acid-Base Excess: 10 mmol/L — ABNORMAL HIGH (ref 0.0–2.0)
Bicarbonate: 36.7 mEq/L — ABNORMAL HIGH (ref 20.0–24.0)
O2 Saturation: 65 %
PCO2 VEN: 58 mmHg — AB (ref 45.0–50.0)
PH VEN: 7.41 — AB (ref 7.250–7.300)
TCO2: 38 mmol/L (ref 0–100)
pO2, Ven: 34 mmHg (ref 30.0–45.0)

## 2015-10-14 LAB — GLUCOSE, CAPILLARY: GLUCOSE-CAPILLARY: 386 mg/dL — AB (ref 65–99)

## 2015-10-14 SURGERY — RIGHT HEART CATH
Anesthesia: LOCAL

## 2015-10-14 MED ORDER — INSULIN ASPART 100 UNIT/ML ~~LOC~~ SOLN
8.0000 [IU] | Freq: Once | SUBCUTANEOUS | Status: AC
  Administered 2015-10-14: 8 [IU] via SUBCUTANEOUS

## 2015-10-14 MED ORDER — LIDOCAINE HCL (PF) 1 % IJ SOLN
INTRAMUSCULAR | Status: AC
  Filled 2015-10-14: qty 30

## 2015-10-14 MED ORDER — MIDAZOLAM HCL 2 MG/2ML IJ SOLN
INTRAMUSCULAR | Status: AC
  Filled 2015-10-14: qty 2

## 2015-10-14 MED ORDER — ONDANSETRON HCL 4 MG/2ML IJ SOLN: 4.0000 mg | Freq: Four times a day (QID) | INTRAMUSCULAR | Status: DC | PRN

## 2015-10-14 MED ORDER — SODIUM CHLORIDE 0.9 % IJ SOLN: 3.0000 mL | Freq: Two times a day (BID) | INTRAMUSCULAR | Status: DC

## 2015-10-14 MED ORDER — FENTANYL CITRATE (PF) 100 MCG/2ML IJ SOLN
25.0000 ug | Freq: Once | INTRAMUSCULAR | Status: AC
  Administered 2015-10-14: 25 ug via INTRAVENOUS

## 2015-10-14 MED ORDER — ASPIRIN 81 MG PO CHEW
81.0000 mg | CHEWABLE_TABLET | ORAL | Status: AC
  Administered 2015-10-14: 81 mg via ORAL

## 2015-10-14 MED ORDER — FENTANYL CITRATE (PF) 100 MCG/2ML IJ SOLN
INTRAMUSCULAR | Status: AC
  Filled 2015-10-14: qty 2

## 2015-10-14 MED ORDER — SODIUM CHLORIDE 0.9 % IV SOLN: 250.0000 mL | INTRAVENOUS | Status: DC | PRN

## 2015-10-14 MED ORDER — ASPIRIN 81 MG PO CHEW
CHEWABLE_TABLET | ORAL | Status: DC
Start: 2015-10-14 — End: 2015-10-14
  Filled 2015-10-14: qty 1

## 2015-10-14 MED ORDER — MIDAZOLAM HCL 2 MG/2ML IJ SOLN
INTRAMUSCULAR | Status: DC | PRN
  Administered 2015-10-14: 1 mg via INTRAVENOUS

## 2015-10-14 MED ORDER — ACETAMINOPHEN 325 MG PO TABS: 650.0000 mg | ORAL_TABLET | ORAL | Status: DC | PRN

## 2015-10-14 MED ORDER — INSULIN ASPART 100 UNIT/ML ~~LOC~~ SOLN
SUBCUTANEOUS | Status: AC
  Filled 2015-10-14: qty 1

## 2015-10-14 MED ORDER — LIDOCAINE HCL (PF) 1 % IJ SOLN
INTRAMUSCULAR | Status: DC | PRN
  Administered 2015-10-14: 11:00:00

## 2015-10-14 MED ORDER — SODIUM CHLORIDE 0.9 % IV SOLN
INTRAVENOUS | Status: DC
Start: 2015-10-14 — End: 2015-10-14
  Administered 2015-10-14: 10:00:00 via INTRAVENOUS

## 2015-10-14 MED ORDER — SODIUM CHLORIDE 0.9 % IJ SOLN: 3.0000 mL | INTRAMUSCULAR | Status: DC | PRN

## 2015-10-14 MED ORDER — FENTANYL CITRATE (PF) 100 MCG/2ML IJ SOLN
INTRAMUSCULAR | Status: AC
  Administered 2015-10-14: 25 ug via INTRAVENOUS
  Filled 2015-10-14: qty 2

## 2015-10-14 MED ORDER — HEPARIN (PORCINE) IN NACL 2-0.9 UNIT/ML-% IJ SOLN
INTRAMUSCULAR | Status: AC
  Filled 2015-10-14: qty 500

## 2015-10-14 MED ORDER — FENTANYL CITRATE (PF) 100 MCG/2ML IJ SOLN
INTRAMUSCULAR | Status: DC | PRN
  Administered 2015-10-14: 25 ug via INTRAVENOUS

## 2015-10-14 SURGICAL SUPPLY — 9 items
CATH BALLN WEDGE 5F 110CM (CATHETERS) ×2 IMPLANT
HOVERMATT SINGLE USE (MISCELLANEOUS) ×1 IMPLANT
KIT HEART LEFT (KITS) ×2 IMPLANT
LINE ARTERIAL PRESSURE 36 MF (TUBING) ×1 IMPLANT
PACK CARDIAC CATHETERIZATION (CUSTOM PROCEDURE TRAY) ×2 IMPLANT
SHEATH FAST CATH BRACH 5F 5CM (SHEATH) ×2 IMPLANT
TRANSDUCER W/STOPCOCK (MISCELLANEOUS) ×3 IMPLANT
TUBING ART PRESS 72  MALE/FEM (TUBING) ×1
TUBING ART PRESS 72 MALE/FEM (TUBING) IMPLANT

## 2015-10-14 NOTE — Interval H&P Note (Signed)
History and Physical Interval Note:  10/14/2015 11:06 AM  Antonio [name]  has presented today for surgery, with the diagnosis of chf  The various methods of treatment have been discussed with the patient and family. After consideration of risks, benefits and other options for treatment, the patient has consented to  Procedure(s): Right Heart Cath (N/A) as a surgical intervention .  The patient's history has been reviewed, patient examined, no change in status, stable for surgery.  I have reviewed the patient's chart and labs.  Questions were answered to the patient's satisfaction.     Tanaysia Bhardwaj [employer]

## 2015-10-14 NOTE — H&P (View-Only) (Signed)
Patient ID: Antonio [name], male   DOB: 1951/07/12, 64 y.o.   MRN: [id number] PCP: Dr Leonette Most [facility]) HF Cardiology: Dr. Shirlee Latch  64 yo with bioprosthetic AVR, chronic diastolic CHF, CKD and morbid obesity presents for CHF clinic followup.  Last echo in 10/16 showed normal bioprosthetic aortic valve and normal EF.  He was  admitted in 10/16 with volume overload and diuresed.  He was sent home on torsemide 80 qam/40 qpm.  He sees a nephrologist in [facility].   He was admitted again in 11/16 with acute on chronic diastolic CHF and inability to do ADLs at home.  He was diuresed and discharged to a rehab facility.  He is now on torsemide 80 mg bid and metolazone twice a week.    Weight is down 4 lbs compared to prior appointment.  He is walking with a walker and is doing pool exercises.  Feels like his mobility is getting better.  He has OSA but is unable to tolerate CPAP.    Labs (10/16): K 3.7, creatinine 1.76, hgb 11.7  Labs (11/16): K 3.6, creatinine 1.68 Labs (12/16): K 4.5, creatinine 2.28  PMH: 1. Severe aortic stenosis: s/p bioprosthetic AVR in 1/13.  2. Chronic diastolic CHF: Echo (10/16) with EF 55-60%, bioprosthetic AVR with mean gradient 13 mmHg, normal RV size and systolic function.   3. OSA: Not on CPAP.  4. Type II diabetes 5. Gout  6. CKD 7. Subclinical hypothyroidism 8. LHC (1/12) with normal coronaries. 9. Chronic LBBB 10. Morbid obesity  Family History  Problem Relation Age of Onset  . Heart attack Father   . Cancer Mother    Social History   Social History  . Marital Status: Divorced    Spouse Name: N/A  . Number of Children: N/A  . Years of Education: N/A   Occupational History  . retired travelling Engineer, site    Social History Main Topics  . Smoking status: Never Smoker   . Smokeless tobacco: Never Used  . Alcohol Use: Yes     Comment: 02/18/2015 "once or twice/yr I'll have a cocktail"      rare  . Drug Use: Yes    Special: Other-see comments  .  Sexual Activity: Not Currently   Other Topics Concern  . None   Social History Narrative   ROS: All systems reviewed and negative except as per HPI  Current Outpatient Prescriptions  Medication Sig Dispense Refill  . aspirin 81 MG chewable tablet Chew 1 tablet (81 mg total) by mouth daily. 30 tablet   . atorvastatin (LIPITOR) 40 MG tablet Take 40 mg by mouth daily at 6 PM.     . colchicine 0.6 MG tablet Take 0.6 mg by mouth daily.     Marland Kitchen docusate sodium (COLACE) 100 MG capsule Take 1 capsule (100 mg total) by mouth 2 (two) times daily. 10 capsule 0  . febuxostat (ULORIC) 40 MG tablet Take 40 mg by mouth daily.     . fenofibrate 54 MG tablet Take 54 mg by mouth daily.  0  . hydrocerin (EUCERIN) CREA Apply 1 application topically 2 (two) times daily. 113 g 0  . HYDROcodone-acetaminophen (NORCO) 7.5-325 MG tablet Take 1 tablet by mouth 2 (two) times daily as needed for moderate pain. 60 tablet 0  . insulin glargine (LANTUS) 100 UNIT/ML injection Inject 60 Units into the skin 2 (two) times daily. As directed    . lisinopril (PRINIVIL,ZESTRIL) 5 MG tablet Take 1 tablet (5 mg  total) by mouth daily. 30 tablet 3  . metolazone (ZAROXOLYN) 2.5 MG tablet Take 1 tablet (2.5 mg total) by mouth 2 (two) times a week. Take every Tuesday and Friday 30 min prior to your AM Torsemide dose 10 tablet 3  . NOVOLOG 100 UNIT/ML injection Inject 30-40 Units into the skin 3 (three) times daily with meals. Sliding scale  0  . potassium chloride SA (K-DUR,KLOR-CON) 20 MEQ tablet Take 3 tabs in AM and 2 tabs in PM 130 tablet 6  . SSD 1 % cream Apply 1 application topically 2 (two) times daily.   0  . torsemide (DEMADEX) 20 MG tablet Take 5 tablets (100 mg total) by mouth 2 (two) times daily. 180 tablet 6   No current facility-administered medications for this encounter.   BP 116/68 mmHg  Pulse 84  Wt 402 lb 12 oz (182.686 kg)  SpO2 97% General: NAD Neck: Thick, JVP difficult. no thyromegaly or thyroid nodule.   Lungs: Clear to auscultation bilaterally with normal respiratory effort. CV: Nondisplaced PMI.  Heart regular S1/S2, no S3/S4, 1/6 SEM RUSB.  1+ edema to knees bilaterally.  No carotid bruit.  Normal pedal pulses.  Abdomen: Soft, nontender, no hepatosplenomegaly, no distention.  Skin: Intact without lesions or rashes.  Neurologic: Alert and oriented x 3.  Psych: Normal affect. Extremities: No clubbing or cyanosis.  HEENT: Normal.   Assessment/Plan: 1. Chronic diastolic CHF: Antonio [name] is now in a rehab facility.  He still has significant lower extremity edema.  I think that a lot of this is venous insufficiency rather than CHF.  Volume status is somewhat difficult, JVP hard to see.  Initially, I had told him that we would increase torsemide to 100 mg bid, but labs today came back with creatinine up to 2.28 and BUN 92.   - Hold torsemide for 2 days, then back to 80 mg bid.  - Stop metolazone altogether.  - BMET again in 1 week.  - Would like to bring him in for RHC, will discuss this with him over phone.  Need more objective evidence of his fluid status.  2. CKD: Stage III.  See above regarding cutting back on diuretics.   3. Bioprosthetic aortic valve: Stable on last echo.  4. He will continue PT at the rehab facility.   [name] 10/04/2015

## 2015-10-14 NOTE — Discharge Instructions (Signed)
Angiogram, Care After °Refer to this sheet in the next few weeks. These instructions provide you with information about caring for yourself after your procedure. Your health care provider may also give you more specific instructions. Your treatment has been planned according to current medical practices, but problems sometimes occur. Call your health care provider if you have any problems or questions after your procedure. °WHAT TO EXPECT AFTER THE PROCEDURE °After your procedure, it is typical to have the following: °· Bruising at the catheter insertion site that usually fades within 1-2 weeks. °· Blood collecting in the tissue (hematoma) that may be painful to the touch. It should usually decrease in size and tenderness within 1-2 weeks. °HOME CARE INSTRUCTIONS °· Take medicines only as directed by your health care provider. °· You may shower 24-48 hours after the procedure or as directed by your health care provider. Remove the bandage (dressing) and gently wash the site with plain soap and water. Pat the area dry with a clean towel. Do not rub the site, because this may cause bleeding. °· Do not take baths, swim, or use a hot tub until your health care provider approves. °· Check your insertion site every day for redness, swelling, or drainage. °· Do not apply powder or lotion to the site. °· Do not lift over 10 lb (4.5 kg) for 5 days after your procedure or as directed by your health care provider. °· Ask your health care provider when it is okay to: °¨ Return to work or school. °¨ Resume usual physical activities or sports. °¨ Resume sexual activity. °· Do not drive home if you are discharged the same day as the procedure. Have someone else drive you. °· You may drive 24 hours after the procedure unless otherwise instructed by your health care provider. °· Do not operate machinery or power tools for 24 hours after the procedure or as directed by your health care provider. °· If your procedure was done as an  outpatient procedure, which means that you went home the same day as your procedure, a responsible adult should be with you for the first 24 hours after you arrive home. °· Keep all follow-up visits as directed by your health care provider. This is important. °SEEK MEDICAL CARE IF: °· You have a fever. °· You have chills. °· You have increased bleeding from the catheter insertion site. Hold pressure on the site. °SEEK IMMEDIATE MEDICAL CARE IF: °· You have unusual pain at the catheter insertion site. °· You have redness, warmth, or swelling at the catheter insertion site. °· You have drainage (other than a small amount of blood on the dressing) from the catheter insertion site. °· The catheter insertion site is bleeding, and the bleeding does not stop after 30 minutes of holding steady pressure on the site. °· The area near or just beyond the catheter insertion site becomes pale, cool, tingly, or numb. °  °This information is not intended to replace advice given to you by your health care provider. Make sure you discuss any questions you have with your health care provider. °  °Document Released: 05/03/2005 Document Revised: 11/05/2014 Document Reviewed: 03/18/2013 °Elsevier Interactive Patient Education ©2016 [employer] Inc[employer] ° °

## 2015-11-03 ENCOUNTER — Ambulatory Visit (HOSPITAL_COMMUNITY)
Admission: RE | Admit: 2015-11-03 | Discharge: 2015-11-03 | Disposition: A | Discharge status: Home / Self Care | Payer: No Typology Code available for payment source | Source: Ambulatory Visit | Attending: Internal Medicine | Admitting: Internal Medicine

## 2015-11-03 VITALS — BP 126/72 | HR 94 | Wt 397.0 lb

## 2015-11-03 DIAGNOSIS — Z952 Presence of prosthetic heart valve: Secondary | ICD-10-CM | POA: Diagnosis not present

## 2015-11-03 DIAGNOSIS — G4730 Sleep apnea, unspecified: Secondary | ICD-10-CM | POA: Diagnosis not present

## 2015-11-03 DIAGNOSIS — G4733 Obstructive sleep apnea (adult) (pediatric): Secondary | ICD-10-CM | POA: Insufficient documentation

## 2015-11-03 DIAGNOSIS — Z7982 Long term (current) use of aspirin: Secondary | ICD-10-CM | POA: Insufficient documentation

## 2015-11-03 DIAGNOSIS — S81801D Unspecified open wound, right lower leg, subsequent encounter: Secondary | ICD-10-CM

## 2015-11-03 DIAGNOSIS — E119 Type 2 diabetes mellitus without complications: Secondary | ICD-10-CM | POA: Diagnosis not present

## 2015-11-03 DIAGNOSIS — I5032 Chronic diastolic (congestive) heart failure: Secondary | ICD-10-CM | POA: Insufficient documentation

## 2015-11-03 DIAGNOSIS — S81801A Unspecified open wound, right lower leg, initial encounter: Secondary | ICD-10-CM | POA: Insufficient documentation

## 2015-11-03 DIAGNOSIS — Z79899 Other long term (current) drug therapy: Secondary | ICD-10-CM | POA: Insufficient documentation

## 2015-11-03 DIAGNOSIS — N183 Chronic kidney disease, stage 3 (moderate): Secondary | ICD-10-CM | POA: Insufficient documentation

## 2015-11-03 DIAGNOSIS — E669 Obesity, unspecified: Secondary | ICD-10-CM | POA: Diagnosis not present

## 2015-11-03 DIAGNOSIS — Z794 Long term (current) use of insulin: Secondary | ICD-10-CM | POA: Diagnosis not present

## 2015-11-03 LAB — BASIC METABOLIC PANEL
ANION GAP: 10 (ref 5–15)
BUN: 37 mg/dL — ABNORMAL HIGH (ref 6–20)
CHLORIDE: 97 mmol/L — AB (ref 101–111)
CO2: 31 mmol/L (ref 22–32)
Calcium: 9.1 mg/dL (ref 8.9–10.3)
Creatinine, Ser: 1.76 mg/dL — ABNORMAL HIGH (ref 0.61–1.24)
GFR calc Af Amer: 45 mL/min — ABNORMAL LOW (ref >60)
GFR, EST NON AFRICAN AMERICAN: 39 mL/min — AB (ref >60)
GLUCOSE: 220 mg/dL — AB (ref 65–99)
POTASSIUM: 5.6 mmol/L — AB (ref 3.5–5.1)
SODIUM: 138 mmol/L (ref 135–145)

## 2015-11-03 NOTE — Patient Instructions (Signed)
Labs today  Your physician recommends that you schedule a follow-up appointment in: 4 weeks with Dr McLean  Do the following things EVERYDAY: 1) Weigh yourself in the morning before breakfast. Write it down and keep it in a log. 2) Take your medicines as prescribed 3) Eat low salt foods-Limit salt (sodium) to 2000 mg per day.  4) Stay as active as you can everyday 5) Limit all fluids for the day to less than 2 liters 6)   

## 2015-11-03 NOTE — Progress Notes (Signed)
Patient ID: Antonio [name], male   DOB: 1951-08-23, 65 y.o.   MRN: [id number] PCP: Dr Leonette Most [facility]) HF Cardiology: Dr. Shirlee Latch  65 yo with bioprosthetic AVR, chronic diastolic CHF, CKD and morbid obesity presents for CHF clinic followup.  Last echo in 10/16 showed normal bioprosthetic aortic valve and normal EF.  He was  admitted in 10/16 with volume overload and diuresed.  He was sent home on torsemide 80 qam/40 qpm.  He sees a nephrologist in [facility].   He was admitted again in 11/16 with acute on chronic diastolic CHF and inability to do ADLs at home.  He was diuresed and discharged to a rehab facility.    Today he returns for HF follow up. Overall feeling ok but complaining of RLE and LLE edemaa.  SOB with exertion. Intolerant CPAP due to claustrophobia. Completed all rehab. Medications provided  At [facility]. Weight at facility has gone down form 400 to 393 pounds.  Not following low salt diet. Drinks less than 2 liters day. He remains at [facility]. Eventually he would like to go home.   RHC 10/14/15 --> Torsemide was increased to 100 mg in am and 80 mg daily.  RA mean 13 RV 37/13 PA 36/14, mean 25 PCWP mean 21 Oxygen saturations: PA 65% AO 98% Cardiac Output (Fick) 6.26  Cardiac Index (Fick) 2.21  Labs (10/16): K 3.7, creatinine 1.76, hgb 11.7  Labs (11/16): K 3.6, creatinine 1.68 Labs (12/16): K 4.5, creatinine 2.28  PMH: 1. Severe aortic stenosis: s/p bioprosthetic AVR in 1/13.  2. Chronic diastolic CHF: Echo (10/16) with EF 55-60%, bioprosthetic AVR with mean gradient 13 mmHg, normal RV size and systolic function.   3. OSA: Not on CPAP.  4. Type II diabetes 5. Gout  6. CKD 7. Subclinical hypothyroidism 8. LHC (1/12) with normal coronaries. 9. Chronic LBBB 10. Morbid obesity  Family History  Problem Relation Age of Onset  . Heart attack Father   . Cancer Mother    Social History   Social History  . Marital Status: Divorced    Spouse Name: N/A  .  Number of Children: N/A  . Years of Education: N/A   Occupational History  . retired travelling Engineer, site    Social History Main Topics  . Smoking status: Never Smoker   . Smokeless tobacco: Never Used  . Alcohol Use: Yes     Comment: 02/18/2015 "once or twice/yr I'll have a cocktail"      rare  . Drug Use: Yes    Special: Other-see comments  . Sexual Activity: Not Currently   Other Topics Concern  . Not on file   Social History Narrative   ROS: All systems reviewed and negative except as per HPI  Current Outpatient Prescriptions  Medication Sig Dispense Refill  . aspirin 81 MG chewable tablet Chew 1 tablet (81 mg total) by mouth daily. 30 tablet   . atorvastatin (LIPITOR) 40 MG tablet Take 40 mg by mouth daily at 6 PM.     . colchicine 0.6 MG tablet Take 0.6 mg by mouth daily.     . diphenhydrAMINE (BENADRYL) 25 MG tablet Take 25 mg by mouth every 6 (six) hours as needed for itching.    . docusate sodium (COLACE) 100 MG capsule Take 1 capsule (100 mg total) by mouth 2 (two) times daily. 10 capsule 0  . febuxostat (ULORIC) 40 MG tablet Take 40 mg by mouth daily.     . fenofibrate 54  MG tablet Take 54 mg by mouth daily.  0  . hydrocerin (EUCERIN) CREA Apply 1 application topically 2 (two) times daily. 113 g 0  . HYDROcodone-acetaminophen (NORCO) 7.5-325 MG tablet Take 1 tablet by mouth every 4 (four) hours as needed for moderate pain.    Marland Kitchen insulin glargine (LANTUS) 100 UNIT/ML injection Inject 60 Units into the skin 2 (two) times daily. As directed    . lisinopril (PRINIVIL,ZESTRIL) 5 MG tablet Take 1 tablet (5 mg total) by mouth daily. 30 tablet 3  . NOVOLOG 100 UNIT/ML injection Inject 30-40 Units into the skin 3 (three) times daily with meals. Sliding scale  0  . potassium chloride SA (K-DUR,KLOR-CON) 20 MEQ tablet Take 3 tabs in AM and 2 tabs in PM 130 tablet 6  . SSD 1 % cream Apply 1 application topically 2 (two) times daily.   0  . temazepam (RESTORIL) 7.5 MG capsule  Take 7.5 mg by mouth at bedtime as needed for sleep.    Marland Kitchen torsemide (DEMADEX) 20 MG tablet Take 80-100 mg by mouth 2 (two) times daily. Take 100 mg (5 tablets) in the morning and 80 mg (4 tablets) in the afternoon     No current facility-administered medications for this encounter.   BP 126/72 mmHg  Pulse 94  Wt 397 lb (180.078 kg)  SpO2 91% General: NAD in wheelchair.  Neck: Thick, JVP difficult. no thyromegaly or thyroid nodule.  Lungs: Clear to auscultation bilaterally with normal respiratory effort. CV: Nondisplaced PMI.  Heart regular S1/S2, no S3/S4, 1/6 SEM RUSB.  1+ edema to knees bilaterally.  No carotid bruit.  Normal pedal pulses.  Abdomen: Soft, nontender, no hepatosplenomegaly, no distention.  Skin: Intact without lesions or rashes.  Neurologic: Alert and oriented x 3.  Psych: Normal affect. Extremities: No clubbing or cyanosis. RLE bulla noted anterior aspect 2-3+ edema. LLE 2+ edema.  HEENT: Normal.   Assessment/Plan: 1. Chronic diastolic CHF: Most recent ECHO 08/11/2015 EF 55-60%  NYHA IIIB. Had RHC on 10/14/15 with elevated filling pressures so torsemide was increased to 100 mg in am and 80 mg in pm. Today he had ongoing volume overload.  Increase torsemide to 100 mg twice a day. Continue 60 meq potassium in am and 40 meq in pm.   - BMET today and in 1 week. .   - I have sent instructions to SNF for low salt diet and limiting fluid intake to < 2 liters per day.  2. CKD: Stage III.- Check BMET today.    3. Bioprosthetic aortic valve: Stable on last echo.  4. RLE wound: [facility] [place]. Needs to reapply compression wraps per Dr Almira Coaster. I have sent orders to SNF.  5. Immunizations- Has had flu shot 2016 and pneumococcal vaccine in 2016.  6. Morbid Obesity: Discussed portion control.  7. OSA: Intolerant CPAP  Follow up 4 weeks with Dr Shirlee Latch.   Antonio Burks NP-C  11/03/2015

## 2015-11-10 ENCOUNTER — Telehealth (HOSPITAL_COMMUNITY): Payer: Self-pay | Admitting: Cardiology

## 2015-11-10 MED ORDER — POTASSIUM CHLORIDE CRYS ER 20 MEQ PO TBCR: 60.0000 meq | EXTENDED_RELEASE_TABLET | Freq: Every day | ORAL | Status: DC

## 2015-11-10 NOTE — Telephone Encounter (Signed)
-----   Message from Sherald Hess, NP sent at 11/10/2015 10:22 AM EST ----- Please call .

## 2015-11-10 NOTE — Telephone Encounter (Signed)
Per amy clegg,np  Please call SNF. Ask SNF to repeat BMET today and next week. Fax results to HF clinic. . Cut potassium back 60 meq daily. Stop evening potassium  Heather with [facility] [phone] aware and voiced understanding. Repeat labs will be done 11/11/15 and repeated x 1week.med changes verified

## 2015-11-22 ENCOUNTER — Other Ambulatory Visit (HOSPITAL_COMMUNITY): Payer: Self-pay | Admitting: Cardiology

## 2015-11-22 NOTE — Telephone Encounter (Signed)
Patient is requesting an increased on his allopurinol Patient is only taking 100 mg daily per patient and R knee gout flare is not getting any better x 2 weeks

## 2015-11-22 NOTE — Telephone Encounter (Signed)
Should not increase allopurinol during a flare.  (or for that matter, start it during a flare)  PCP should manage/facility MD.    Baldwin Crown" Franklin, PA-C 11/22/2015 2:04 PM

## 2015-11-24 NOTE — Telephone Encounter (Signed)
Facility aware.

## 2015-11-25 ENCOUNTER — Telehealth (HOSPITAL_COMMUNITY): Payer: Self-pay | Admitting: Cardiology

## 2015-11-25 NOTE — Telephone Encounter (Signed)
Abnormal labs drawn at patients facility Labs drawn 11/18/15 k- 3.8  Per Joanell Rising Patient should increase potassium to 40 meQ twice a day  Detailed message left with Okey Regal for [place], Charity fundraiser at [employer]

## 2015-11-28 ENCOUNTER — Telehealth (HOSPITAL_COMMUNITY): Payer: Self-pay

## 2015-11-28 NOTE — Telephone Encounter (Signed)
Patient told to call his primary care provider, Dr. Leonette Most in [place] for a RX for a lift chair and a steroid RX for his gout. Patient agrees and he will call that office now

## 2015-12-01 ENCOUNTER — Telehealth (HOSPITAL_COMMUNITY): Payer: Self-pay

## 2015-12-01 NOTE — Telephone Encounter (Signed)
Order for lift chair mailed to patient's home  With MD NPI, IC 10 dx code for CHF and obesity, today's date, patient name, age and DOB

## 2015-12-01 NOTE — Telephone Encounter (Signed)
That would be fine 

## 2015-12-01 NOTE — Telephone Encounter (Signed)
Patient has been unsuccessful getting in  touch with his PCP to write him an order for a lift chair. Patient would like to know if you would write him a prescription for this

## 2015-12-02 ENCOUNTER — Encounter (HOSPITAL_COMMUNITY): Payer: Medicare Other

## 2015-12-02 ENCOUNTER — Other Ambulatory Visit (HOSPITAL_COMMUNITY): Payer: Self-pay | Admitting: Adult Health

## 2015-12-02 ENCOUNTER — Telehealth (HOSPITAL_COMMUNITY): Payer: Self-pay | Admitting: Cardiology

## 2015-12-02 NOTE — Telephone Encounter (Signed)
Patient called to report he received message regarding order for lift chair will be mailed to his home address  Patient reports he is still in a rehab facility andd will need the order mailed to [facility] [address] [place], [place] [address]   As requested, order re written for lift chair- UAD Dx I50.33,E66.9 Weight 11/03/15 397 lbs and provider NPI  Mailed to Eligha Bridegroom Attn: Curly Rim

## 2015-12-06 ENCOUNTER — Telehealth (HOSPITAL_COMMUNITY): Payer: Self-pay

## 2015-12-06 NOTE — Telephone Encounter (Signed)
Patient needing last OV note and demographics faxed to [facility] medical supply for support of lift chair order by Dr. Shirlee Latch to get insurance approval. Documents faxed to number provided by patient [phone] attn: [email or web address]. Patient made aware that this was completed as requested.  Ave Filter

## 2015-12-28 ENCOUNTER — Telehealth (HOSPITAL_COMMUNITY): Payer: Self-pay

## 2015-12-28 NOTE — Telephone Encounter (Signed)
Patient called CHF clinic to report that he has just been discharged from 100 day rehabilitation stay at [facility] in [place]. Would like to have his medical records/labs faxed to our office for Dr. Shirlee Latch to review. Will reach out to them to request this info.  Antonio [name]

## 2015-12-30 ENCOUNTER — Encounter (HOSPITAL_COMMUNITY): Payer: Self-pay

## 2016-01-01 ENCOUNTER — Emergency Department (HOSPITAL_COMMUNITY): Payer: Medicare Other

## 2016-01-01 ENCOUNTER — Encounter (HOSPITAL_COMMUNITY): Payer: Self-pay | Admitting: Family Medicine

## 2016-01-01 ENCOUNTER — Emergency Department (HOSPITAL_COMMUNITY)
Admit: 2016-01-01 | Discharge: 2016-01-01 | Disposition: A | Discharge status: Home / Self Care | Payer: Medicare Other | Attending: Emergency Medicine | Admitting: Emergency Medicine

## 2016-01-01 ENCOUNTER — Inpatient Hospital Stay (HOSPITAL_COMMUNITY)
Admission: EM | Admit: 2016-01-01 | Discharge: 2016-01-09 | DRG: 189 | Disposition: A | Discharge status: Skilled Nursing Facility | Payer: Medicare Other | Attending: Internal Medicine | Admitting: Internal Medicine

## 2016-01-01 DIAGNOSIS — R0602 Shortness of breath: Secondary | ICD-10-CM

## 2016-01-01 DIAGNOSIS — J9690 Respiratory failure, unspecified, unspecified whether with hypoxia or hypercapnia: Secondary | ICD-10-CM | POA: Diagnosis present

## 2016-01-01 DIAGNOSIS — F419 Anxiety disorder, unspecified: Secondary | ICD-10-CM | POA: Diagnosis present

## 2016-01-01 DIAGNOSIS — N183 Chronic kidney disease, stage 3 (moderate): Secondary | ICD-10-CM | POA: Diagnosis present

## 2016-01-01 DIAGNOSIS — R6510 Systemic inflammatory response syndrome (SIRS) of non-infectious origin without acute organ dysfunction: Secondary | ICD-10-CM | POA: Diagnosis present

## 2016-01-01 DIAGNOSIS — G9340 Encephalopathy, unspecified: Secondary | ICD-10-CM | POA: Diagnosis present

## 2016-01-01 DIAGNOSIS — Z7982 Long term (current) use of aspirin: Secondary | ICD-10-CM | POA: Diagnosis not present

## 2016-01-01 DIAGNOSIS — J189 Pneumonia, unspecified organism: Secondary | ICD-10-CM | POA: Diagnosis present

## 2016-01-01 DIAGNOSIS — J9601 Acute respiratory failure with hypoxia: Secondary | ICD-10-CM

## 2016-01-01 DIAGNOSIS — M7989 Other specified soft tissue disorders: Secondary | ICD-10-CM | POA: Diagnosis not present

## 2016-01-01 DIAGNOSIS — G4733 Obstructive sleep apnea (adult) (pediatric): Secondary | ICD-10-CM | POA: Diagnosis not present

## 2016-01-01 DIAGNOSIS — J9602 Acute respiratory failure with hypercapnia: Secondary | ICD-10-CM | POA: Diagnosis not present

## 2016-01-01 DIAGNOSIS — Z6841 Body Mass Index (BMI) 40.0 and over, adult: Secondary | ICD-10-CM | POA: Diagnosis not present

## 2016-01-01 DIAGNOSIS — Y95 Nosocomial condition: Secondary | ICD-10-CM | POA: Diagnosis present

## 2016-01-01 DIAGNOSIS — J9620 Acute and chronic respiratory failure, unspecified whether with hypoxia or hypercapnia: Secondary | ICD-10-CM | POA: Diagnosis not present

## 2016-01-01 DIAGNOSIS — R4182 Altered mental status, unspecified: Secondary | ICD-10-CM | POA: Diagnosis not present

## 2016-01-01 DIAGNOSIS — Z91018 Allergy to other foods: Secondary | ICD-10-CM

## 2016-01-01 DIAGNOSIS — N184 Chronic kidney disease, stage 4 (severe): Secondary | ICD-10-CM | POA: Diagnosis not present

## 2016-01-01 DIAGNOSIS — Z9989 Dependence on other enabling machines and devices: Secondary | ICD-10-CM

## 2016-01-01 DIAGNOSIS — I5033 Acute on chronic diastolic (congestive) heart failure: Secondary | ICD-10-CM | POA: Diagnosis present

## 2016-01-01 DIAGNOSIS — J9621 Acute and chronic respiratory failure with hypoxia: Secondary | ICD-10-CM | POA: Diagnosis present

## 2016-01-01 DIAGNOSIS — I130 Hypertensive heart and chronic kidney disease with heart failure and stage 1 through stage 4 chronic kidney disease, or unspecified chronic kidney disease: Secondary | ICD-10-CM | POA: Diagnosis present

## 2016-01-01 DIAGNOSIS — Z953 Presence of xenogenic heart valve: Secondary | ICD-10-CM | POA: Diagnosis not present

## 2016-01-01 DIAGNOSIS — Z8614 Personal history of Methicillin resistant Staphylococcus aureus infection: Secondary | ICD-10-CM | POA: Diagnosis not present

## 2016-01-01 DIAGNOSIS — E785 Hyperlipidemia, unspecified: Secondary | ICD-10-CM | POA: Diagnosis present

## 2016-01-01 DIAGNOSIS — IMO0001 Reserved for inherently not codable concepts without codable children: Secondary | ICD-10-CM | POA: Insufficient documentation

## 2016-01-01 DIAGNOSIS — Z8249 Family history of ischemic heart disease and other diseases of the circulatory system: Secondary | ICD-10-CM

## 2016-01-01 DIAGNOSIS — H5441 Blindness, right eye, normal vision left eye: Secondary | ICD-10-CM | POA: Diagnosis present

## 2016-01-01 DIAGNOSIS — I2510 Atherosclerotic heart disease of native coronary artery without angina pectoris: Secondary | ICD-10-CM | POA: Diagnosis present

## 2016-01-01 DIAGNOSIS — J9622 Acute and chronic respiratory failure with hypercapnia: Principal | ICD-10-CM | POA: Diagnosis present

## 2016-01-01 DIAGNOSIS — E11649 Type 2 diabetes mellitus with hypoglycemia without coma: Secondary | ICD-10-CM | POA: Diagnosis present

## 2016-01-01 DIAGNOSIS — Z794 Long term (current) use of insulin: Secondary | ICD-10-CM

## 2016-01-01 DIAGNOSIS — Z79899 Other long term (current) drug therapy: Secondary | ICD-10-CM | POA: Diagnosis not present

## 2016-01-01 DIAGNOSIS — Z9119 Patient's noncompliance with other medical treatment and regimen: Secondary | ICD-10-CM | POA: Diagnosis not present

## 2016-01-01 DIAGNOSIS — N179 Acute kidney failure, unspecified: Secondary | ICD-10-CM | POA: Diagnosis present

## 2016-01-01 DIAGNOSIS — E119 Type 2 diabetes mellitus without complications: Secondary | ICD-10-CM

## 2016-01-01 DIAGNOSIS — M109 Gout, unspecified: Secondary | ICD-10-CM | POA: Diagnosis present

## 2016-01-01 DIAGNOSIS — E872 Acidosis: Secondary | ICD-10-CM | POA: Diagnosis present

## 2016-01-01 DIAGNOSIS — E662 Morbid (severe) obesity with alveolar hypoventilation: Secondary | ICD-10-CM | POA: Diagnosis present

## 2016-01-01 DIAGNOSIS — I959 Hypotension, unspecified: Secondary | ICD-10-CM | POA: Diagnosis present

## 2016-01-01 DIAGNOSIS — R0600 Dyspnea, unspecified: Secondary | ICD-10-CM

## 2016-01-01 DIAGNOSIS — I2699 Other pulmonary embolism without acute cor pulmonale: Secondary | ICD-10-CM

## 2016-01-01 DIAGNOSIS — E1122 Type 2 diabetes mellitus with diabetic chronic kidney disease: Secondary | ICD-10-CM | POA: Diagnosis present

## 2016-01-01 DIAGNOSIS — I5032 Chronic diastolic (congestive) heart failure: Secondary | ICD-10-CM | POA: Diagnosis not present

## 2016-01-01 DIAGNOSIS — Z954 Presence of other heart-valve replacement: Secondary | ICD-10-CM | POA: Diagnosis not present

## 2016-01-01 LAB — CBC WITH DIFFERENTIAL/PLATELET
BASOS ABS: 0 10*3/uL (ref 0.0–0.1)
BASOS PCT: 0 %
EOS ABS: 0.1 10*3/uL (ref 0.0–0.7)
EOS PCT: 1 %
HEMATOCRIT: 45.6 % (ref 39.0–52.0)
Hemoglobin: 15.2 g/dL (ref 13.0–17.0)
Lymphocytes Relative: 21 %
Lymphs Abs: 2.7 10*3/uL (ref 0.7–4.0)
MCH: 27.1 pg (ref 26.0–34.0)
MCHC: 33.3 g/dL (ref 30.0–36.0)
MCV: 81.3 fL (ref 78.0–100.0)
MONO ABS: 1.2 10*3/uL — AB (ref 0.1–1.0)
Monocytes Relative: 9 %
NEUTROS ABS: 9 10*3/uL — AB (ref 1.7–7.7)
NEUTROS PCT: 69 %
Platelets: 248 10*3/uL (ref 150–400)
RBC: 5.61 MIL/uL (ref 4.22–5.81)
RDW: 17.7 % — AB (ref 11.5–15.5)
WBC: 13 10*3/uL — ABNORMAL HIGH (ref 4.0–10.5)

## 2016-01-01 LAB — URINALYSIS, ROUTINE W REFLEX MICROSCOPIC
Bilirubin Urine: NEGATIVE
Glucose, UA: NEGATIVE mg/dL
Hgb urine dipstick: NEGATIVE
Ketones, ur: NEGATIVE mg/dL
Leukocytes, UA: NEGATIVE
NITRITE: NEGATIVE
Protein, ur: NEGATIVE mg/dL
SPECIFIC GRAVITY, URINE: 1.018 (ref 1.005–1.030)
pH: 5 (ref 5.0–8.0)

## 2016-01-01 LAB — GLUCOSE, CAPILLARY
GLUCOSE-CAPILLARY: 76 mg/dL (ref 65–99)
GLUCOSE-CAPILLARY: 93 mg/dL (ref 65–99)
Glucose-Capillary: 64 mg/dL — ABNORMAL LOW (ref 65–99)

## 2016-01-01 LAB — BASIC METABOLIC PANEL
Anion gap: 11 (ref 5–15)
BUN: 37 mg/dL — AB (ref 6–20)
CHLORIDE: 96 mmol/L — AB (ref 101–111)
CO2: 28 mmol/L (ref 22–32)
Calcium: 8.4 mg/dL — ABNORMAL LOW (ref 8.9–10.3)
Creatinine, Ser: 1.63 mg/dL — ABNORMAL HIGH (ref 0.61–1.24)
GFR calc Af Amer: 49 mL/min — ABNORMAL LOW (ref >60)
GFR calc non Af Amer: 43 mL/min — ABNORMAL LOW (ref >60)
Glucose, Bld: 225 mg/dL — ABNORMAL HIGH (ref 65–99)
POTASSIUM: 4.4 mmol/L (ref 3.5–5.1)
SODIUM: 135 mmol/L (ref 135–145)

## 2016-01-01 LAB — CBG MONITORING, ED
GLUCOSE-CAPILLARY: 221 mg/dL — AB (ref 65–99)
GLUCOSE-CAPILLARY: 94 mg/dL (ref 65–99)

## 2016-01-01 LAB — I-STAT VENOUS BLOOD GAS, ED
Acid-Base Excess: 8 mmol/L — ABNORMAL HIGH (ref 0.0–2.0)
Bicarbonate: 34.3 mEq/L — ABNORMAL HIGH (ref 20.0–24.0)
O2 Saturation: 94 %
PCO2 VEN: 49.1 mmHg (ref 45.0–50.0)
PH VEN: 7.452 — AB (ref 7.250–7.300)
TCO2: 36 mmol/L (ref 0–100)
pO2, Ven: 69 mmHg — ABNORMAL HIGH (ref 30.0–45.0)

## 2016-01-01 LAB — PROCALCITONIN: Procalcitonin: <0.1 ng/mL

## 2016-01-01 LAB — I-STAT TROPONIN, ED: Troponin i, poc: 0.05 ng/mL (ref 0.00–0.08)

## 2016-01-01 LAB — HEPARIN LEVEL (UNFRACTIONATED): HEPARIN UNFRACTIONATED: 0.32 [IU]/mL (ref 0.30–0.70)

## 2016-01-01 LAB — I-STAT CG4 LACTIC ACID, ED
LACTIC ACID, VENOUS: 1.35 mmol/L (ref 0.5–2.0)
LACTIC ACID, VENOUS: 2.3 mmol/L — AB (ref 0.5–2.0)

## 2016-01-01 LAB — BRAIN NATRIURETIC PEPTIDE: B NATRIURETIC PEPTIDE 5: 196 pg/mL — AB (ref 0.0–100.0)

## 2016-01-01 LAB — LACTIC ACID, PLASMA: LACTIC ACID, VENOUS: 2.1 mmol/L — AB (ref 0.5–2.0)

## 2016-01-01 LAB — TROPONIN I: TROPONIN I: 0.05 ng/mL — AB (ref <0.031)

## 2016-01-01 MED ORDER — PIPERACILLIN-TAZOBACTAM 3.375 G IVPB 30 MIN
3.3750 g | Freq: Once | INTRAVENOUS | Status: AC
  Administered 2016-01-01: 3.375 g via INTRAVENOUS
  Filled 2016-01-01: qty 50

## 2016-01-01 MED ORDER — HEPARIN (PORCINE) IN NACL 100-0.45 UNIT/ML-% IJ SOLN
2300.0000 [IU]/h | INTRAMUSCULAR | Status: DC
  Administered 2016-01-01: 2000 [IU]/h via INTRAVENOUS
  Administered 2016-01-02: 2250 [IU]/h via INTRAVENOUS
  Administered 2016-01-02: 2300 [IU]/h via INTRAVENOUS
  Filled 2016-01-01 (×7): qty 250

## 2016-01-01 MED ORDER — DEXTROSE 50 % IV SOLN
INTRAVENOUS | Status: AC
  Administered 2016-01-01: 25 mL
  Filled 2016-01-01: qty 50

## 2016-01-01 MED ORDER — TORSEMIDE 20 MG PO TABS: 80.0000 mg | ORAL_TABLET | Freq: Two times a day (BID) | ORAL | Status: DC

## 2016-01-01 MED ORDER — VANCOMYCIN HCL 10 G IV SOLR
2500.0000 mg | Freq: Once | INTRAVENOUS | Status: AC
  Administered 2016-01-01: 2500 mg via INTRAVENOUS
  Filled 2016-01-01: qty 2500

## 2016-01-01 MED ORDER — COLCHICINE 0.6 MG PO TABS
0.6000 mg | ORAL_TABLET | Freq: Every day | ORAL | Status: DC
  Administered 2016-01-02 – 2016-01-09 (×8): 0.6 mg via ORAL
  Filled 2016-01-01 (×9): qty 1

## 2016-01-01 MED ORDER — ADULT MULTIVITAMIN W/MINERALS CH
1.0000 | ORAL_TABLET | Freq: Every day | ORAL | Status: DC
  Administered 2016-01-02 – 2016-01-09 (×8): 1 via ORAL
  Filled 2016-01-01 (×9): qty 1

## 2016-01-01 MED ORDER — ALBUTEROL SULFATE (2.5 MG/3ML) 0.083% IN NEBU
2.5000 mg | INHALATION_SOLUTION | Freq: Once | RESPIRATORY_TRACT | Status: AC
  Administered 2016-01-01: 2.5 mg via RESPIRATORY_TRACT
  Filled 2016-01-01: qty 3

## 2016-01-01 MED ORDER — ONE-DAILY MULTI VITAMINS PO TABS: 1.0000 | ORAL_TABLET | Freq: Every day | ORAL | Status: DC

## 2016-01-01 MED ORDER — PIPERACILLIN-TAZOBACTAM 3.375 G IVPB
3.3750 g | Freq: Three times a day (TID) | INTRAVENOUS | Status: DC
  Administered 2016-01-01 – 2016-01-05 (×12): 3.375 g via INTRAVENOUS
  Filled 2016-01-01 (×16): qty 50

## 2016-01-01 MED ORDER — TORSEMIDE 20 MG PO TABS
80.0000 mg | ORAL_TABLET | Freq: Two times a day (BID) | ORAL | Status: DC
  Administered 2016-01-02 – 2016-01-03 (×2): 80 mg via ORAL
  Filled 2016-01-01 (×4): qty 4

## 2016-01-01 MED ORDER — ASPIRIN 325 MG PO TABS
325.0000 mg | ORAL_TABLET | Freq: Every day | ORAL | Status: DC
  Administered 2016-01-02 – 2016-01-09 (×8): 325 mg via ORAL
  Filled 2016-01-01 (×10): qty 1

## 2016-01-01 MED ORDER — HEPARIN BOLUS VIA INFUSION
6000.0000 [IU] | Freq: Once | INTRAVENOUS | Status: AC
  Administered 2016-01-01: 6000 [IU] via INTRAVENOUS

## 2016-01-01 MED ORDER — INSULIN DETEMIR 100 UNIT/ML ~~LOC~~ SOLN
30.0000 [IU] | Freq: Two times a day (BID) | SUBCUTANEOUS | Status: DC
  Administered 2016-01-02 – 2016-01-03 (×2): 30 [IU] via SUBCUTANEOUS
  Filled 2016-01-01 (×6): qty 0.3

## 2016-01-01 MED ORDER — SODIUM CHLORIDE 0.9 % IV BOLUS (SEPSIS)
3000.0000 mL | Freq: Once | INTRAVENOUS | Status: AC
Start: 2016-01-01 — End: 2016-01-01
  Administered 2016-01-01: 3000 mL via INTRAVENOUS

## 2016-01-01 MED ORDER — INSULIN ASPART 100 UNIT/ML ~~LOC~~ SOLN
0.0000 [IU] | SUBCUTANEOUS | Status: DC
  Administered 2016-01-02: 4 [IU] via SUBCUTANEOUS
  Administered 2016-01-02: 3 [IU] via SUBCUTANEOUS
  Administered 2016-01-02: 11 [IU] via SUBCUTANEOUS
  Administered 2016-01-03 (×3): 4 [IU] via SUBCUTANEOUS
  Administered 2016-01-03: 7 [IU] via SUBCUTANEOUS
  Administered 2016-01-04 (×2): 4 [IU] via SUBCUTANEOUS
  Administered 2016-01-04: 7 [IU] via SUBCUTANEOUS
  Administered 2016-01-04: 15 [IU] via SUBCUTANEOUS
  Administered 2016-01-05: 3 [IU] via SUBCUTANEOUS
  Administered 2016-01-05: 15 [IU] via SUBCUTANEOUS
  Administered 2016-01-05: 7 [IU] via SUBCUTANEOUS
  Administered 2016-01-05: 4 [IU] via SUBCUTANEOUS
  Administered 2016-01-05 – 2016-01-06 (×2): 7 [IU] via SUBCUTANEOUS
  Administered 2016-01-06: 4 [IU] via SUBCUTANEOUS
  Administered 2016-01-06: 11 [IU] via SUBCUTANEOUS

## 2016-01-01 MED ORDER — PANTOPRAZOLE SODIUM 40 MG IV SOLR
40.0000 mg | Freq: Every day | INTRAVENOUS | Status: DC
  Administered 2016-01-01: 40 mg via INTRAVENOUS

## 2016-01-01 MED ORDER — VANCOMYCIN HCL 10 G IV SOLR
1250.0000 mg | Freq: Two times a day (BID) | INTRAVENOUS | Status: DC
  Administered 2016-01-02 – 2016-01-05 (×8): 1250 mg via INTRAVENOUS
  Filled 2016-01-01 (×10): qty 1250

## 2016-01-01 MED ORDER — SODIUM CHLORIDE 0.9 % IV BOLUS (SEPSIS)
500.0000 mL | Freq: Once | INTRAVENOUS | Status: AC
  Administered 2016-01-01: 500 mL via INTRAVENOUS

## 2016-01-01 MED ORDER — ATORVASTATIN CALCIUM 40 MG PO TABS
40.0000 mg | ORAL_TABLET | Freq: Every day | ORAL | Status: DC
  Administered 2016-01-02 – 2016-01-08 (×7): 40 mg via ORAL
  Filled 2016-01-01 (×7): qty 1

## 2016-01-01 MED ORDER — SODIUM CHLORIDE 0.9 % IV BOLUS (SEPSIS): 1000.0000 mL | Freq: Once | INTRAVENOUS | Status: DC

## 2016-01-01 NOTE — Progress Notes (Signed)
Patient transported to 2M07 on BiPAP by this RT. Vitals stable.

## 2016-01-01 NOTE — ED Notes (Signed)
MD at bedside.Rees 

## 2016-01-01 NOTE — ED Notes (Signed)
CBG 94 

## 2016-01-01 NOTE — ED Notes (Signed)
Pharmacy called to obtain heparin and Vancomycin.

## 2016-01-01 NOTE — Progress Notes (Signed)
ANTICOAGULATION CONSULT NOTE   Pharmacy Consult for  Heparin Indication: PE  Allergies  Allergen Reactions  . Other Other (See Comments)    Salt causes "extreme" edema.    Patient Measurements: Weight: (!) 396 lb 13.3 oz (180 kg) Height: 179 cm (on 10/14/15) IBW: 73 kg Heparin Dosing Weight: 118 kg  Vital Signs: Temp: 97.3 F (36.3 C) (03/05 2000) Temp Source: Axillary (03/05 2000) BP: 101/68 mmHg (03/05 2230) Pulse Rate: 92 (03/05 2200)  Labs:  Recent Labs  01/01/16 1108 01/01/16 1204 01/01/16 1940 01/01/16 2222  HGB 15.2  --   --   --   HCT 45.6  --   --   --   PLT 248  --   --   --   HEPARINUNFRC <0.10*  --   --  0.32  CREATININE  --  1.63*  --   --   TROPONINI  --   --  0.05*  --     Estimated Creatinine Clearance: 74.5 mL/min (by C-G formula based on Cr of 1.63).  Assessment: [name] with PE for heparin  Goal of Therapy:  Heparin level 0.3-0.7 units/ml  Monitor platelets by anticoagulation protocol: Yes   Plan:  Increase Heparin 2250 units/hr to keep in range  .Follow-up am labs.  Geannie Risen, PharmD, BCPS

## 2016-01-01 NOTE — ED Notes (Signed)
Pt presents from home via [facility] w/ c/o shortness of breath x2 days, worse this morning. Was recently d/c'd from nursing home after 100 day stay for "a vascular problem with swelling everywhere".  He states was dx'd w/ bronchitis a few days prior to discharge.  Pt is A&Ox4, but SpO2 is 83%RA; on 3LNC improvement to 90%.

## 2016-01-01 NOTE — ED Notes (Signed)
ECHO tech at bedside.

## 2016-01-01 NOTE — Progress Notes (Addendum)
ANTICOAGULATION/ANTIBIOTIC CONSULT NOTE - Initial Consult   Pharmacy Consult for Vanc + Heparin Indication: sepsis + PE  Allergies  Allergen Reactions  . Other Other (See Comments)    Salt causes "extreme" edema.    Patient Measurements:  Weight: 180.1 kg (on 11/03/15) Height: 179 cm (on 10/14/15) IBW: 73 kg Heparin Dosing Weight: 118 kg  Vital Signs: Temp: 98 F (36.7 C) (03/05 1018) Temp Source: Oral (03/05 1018) BP: 83/54 mmHg (03/05 1200) Pulse Rate: 99 (03/05 1200)  Labs:  Recent Labs  01/01/16 1108  HGB 15.2  HCT 45.6  PLT PENDING    CrCl cannot be calculated (Unknown ideal weight.).   Medical History: Past Medical History  Diagnosis Date  . Blindness of right eye     a. due to amblyopia as child  . Hx MRSA infection     a. thigh abcess 2006  . Anxiety   . Aortic stenosis     a. s/p AVR 11/29/11  . HTN (hypertension)   . HLD (hyperlipidemia)   . Gout   . Chronic diastolic heart failure (HCC)     a. Echo 08/11/2015 LVEF 55-60% with normal RV function  . DM2 (diabetes mellitus, type 2) (HCC)   . OSA on CPAP   . Hepatitis   . Arthritis   . History of acute renal failure     Assessment: 65 yo M admitted 01/01/2016 with complaint of SOB over the last 7 days. 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 Pharmacy consulted to dose heparin for PE and vancomycin for sepsis. Labs currently pending.  Goal of Therapy:  Heparin level 0.3-0.7 units/ml  Vancomycin trough 15-20 Monitor platelets by anticoagulation protocol: Yes   Plan:  Give 6000 units bolus x 1 Start heparin infusion at 2000 units/hr Check anti-Xa level in 6 hours and daily while on heparin Continue to monitor H&H and platelets  Vancomycin 2500 mg x1 loading dose Monitor 6-hr HL, daily HL, CBC and s/sx of bleeding F/u renal function labs and enter maintenance doses.  Casilda Carls, PharmD. PGY-1 Pharmacy  Resident Pager: [phone] 01/01/2016,12:05 PM   Addendum: Labs back - sCr 1.63 with CrCl 31ml/min. Will order vancomycin maintenance dose 1250mg  IV q12.  Louie Casa, PharmD, BCPS 01/01/2016, 6:25 PM

## 2016-01-01 NOTE — ED Provider Notes (Signed)
CSN: [id number]     Arrival date & time 01/01/16  1012 History   First MD Initiated Contact with Patient 01/01/16 1032     Chief Complaint  Patient presents with  . Shortness of Breath    Patient is a 65 y.o. male presenting with shortness of breath. The history is provided by the patient. No language interpreter was used.  Shortness of Breath  Antonio [name] is a 65 y.o. male who presents to the Emergency Department complaining of SOB.   He reports 7 days of increased shortness of breath. He reports a mild unproductive cough. No fevers, chest pain. No lower extremity swelling. Symptoms are gradual in onset and worsening. He was unable to sleep last night due to shortness of breath. He was discharged from rehabilitation 4 days ago. He has a history of valvular heart disease status post bovine aortic valve replacement.  Past Medical History  Diagnosis Date  . Blindness of right eye     a. due to amblyopia as child  . Hx MRSA infection     a. thigh abcess 2006  . Anxiety   . Aortic stenosis     a. s/p AVR 11/29/11  . HTN (hypertension)   . HLD (hyperlipidemia)   . Gout   . Chronic diastolic heart failure (HCC)     a. Echo 08/11/2015 LVEF 55-60% with normal RV function  . DM2 (diabetes mellitus, type 2) (HCC)   . OSA on CPAP   . Hepatitis   . Arthritis   . History of acute renal failure    Past Surgical History  Procedure Laterality Date  . Lumbar laminectomy  10/30/1983  . Aortic valve replacement  11/29/2011    Procedure: AORTIC VALVE REPLACEMENT (AVR);  Surgeon: [name], MD;  Location: [facility] OR;  Service: Open Heart Surgery;  Laterality: N/A;  with nitric oxide  . Right heart catheterization N/A 11/28/2011    Procedure: RIGHT HEART CATH;  Surgeon: [name], MD;  Location: [facility] C[facility] LAB;  Service: Cardiovascular;  Laterality: N/A;  . Cardiac valve replacement    . Incision and drainage abscess Left 08/2005    "upper/inner thigh; that's where the MRSA was"  .  Back surgery    . Cardiac catheterization    . Cardiac catheterization N/A 10/14/2015    Procedure: Right Heart Cath;  Surgeon: [name], MD;  Location: [facility] INVASIVE CV LAB;  Service: Cardiovascular;  Laterality: N/A;   Family History  Problem Relation Age of Onset  . Heart attack Father   . Cancer Mother    Social History  Substance Use Topics  . Smoking status: Never Smoker   . Smokeless tobacco: Never Used  . Alcohol Use: Yes     Comment: 02/18/2015 "once or twice/yr I'll have a cocktail"      rare    Review of Systems  Respiratory: Positive for shortness of breath.   All other systems reviewed and are negative.     Allergies  Other  Home Medications   Prior to Admission medications   Medication Sig Start Date End Date Taking? Authorizing Provider  aspirin 81 MG chewable tablet Chew 1 tablet (81 mg total) by mouth daily. 06/21/15   [name], PA-C  atorvastatin (LIPITOR) 40 MG tablet Take 40 mg by mouth daily at 6 PM.  12/05/14   Historical Provider, MD  colchicine 0.6 MG tablet Take 0.6 mg by mouth daily.  09/01/14   Historical Provider, MD  diphenhydrAMINE (BENADRYL) 25 MG tablet Take 25 mg by mouth every 6 (six) hours as needed for itching.    Historical Provider, MD  docusate sodium (COLACE) 100 MG capsule Take 1 capsule (100 mg total) by mouth 2 (two) times daily. 09/19/15   [name], PA-C  febuxostat (ULORIC) 40 MG tablet Take 40 mg by mouth daily.     Historical Provider, MD  fenofibrate 54 MG tablet Take 54 mg by mouth daily. 05/04/15   Historical Provider, MD  hydrocerin (EUCERIN) CREA Apply 1 application topically 2 (two) times daily. 09/19/15   [name], PA-C  HYDROcodone-acetaminophen (NORCO) 7.5-325 MG tablet Take 1 tablet by mouth every 4 (four) hours as needed for moderate pain.    Historical Provider, MD  insulin glargine (LANTUS) 100 UNIT/ML injection Inject 60 Units into the skin 2 (two) times daily. As directed     Historical Provider, MD  lisinopril (PRINIVIL,ZESTRIL) 5 MG tablet Take 1 tablet (5 mg total) by mouth daily. 08/14/15   Janetta Hora, PA-C  NOVOLOG 100 UNIT/ML injection Inject 30-40 Units into the skin 3 (three) times daily with meals. Sliding scale 05/12/15   Historical Provider, MD  potassium chloride SA (K-DUR,KLOR-CON) 20 MEQ tablet Take 3 tablets (60 mEq total) by mouth daily. Take 3 tabs in AM and 2 tabs in PM 11/10/15   Amy D Clegg, NP  SSD 1 % cream Apply 1 application topically 2 (two) times daily.  05/13/15   Historical Provider, MD  temazepam (RESTORIL) 7.5 MG capsule Take 7.5 mg by mouth at bedtime as needed for sleep.    Historical Provider, MD  torsemide (DEMADEX) 20 MG tablet Take 80-100 mg by mouth 2 (two) times daily. Take 100 mg (5 tablets) in the morning and 80 mg (4 tablets) in the afternoon    Historical Provider, MD   BP 100/37 mmHg  Pulse 107  Temp(Src) 98 F (36.7 C) (Oral)  Resp 31  SpO2 96% Physical Exam  Constitutional: He is oriented to person, place, and time. He appears well-developed and well-nourished. He appears distressed.   obese  HENT:  Head: Normocephalic and atraumatic.  Cardiovascular: Regular rhythm.   No murmur heard.  tachycardic  Pulmonary/Chest:   Tachypnea with decreased air movement bilaterally  Abdominal: Soft. There is no tenderness. There is no rebound and no guarding.  Musculoskeletal:   Cool extremities, particularly the feet. There is erythema of the right calf and foot.  Neurological: He is alert and oriented to person, place, and time.  Skin: Skin is warm and dry. There is pallor.  Psychiatric: He has a normal mood and affect. His behavior is normal.  Nursing note and vitals reviewed.   ED Course  Procedures (including critical care time) CRITICAL CARE Performed by: Tilden Fossa   Total critical care time:  Critical care time was exclusive of separately billable procedures and treating other  patients.  Critical care was necessary to treat or prevent imminent or life-threatening deterioration.  Critical care was time spent personally by me on the following activities: development of treatment plan with patient and/or surrogate as well as nursing, discussions with consultants, evaluation of patient's response to treatment, examination of patient, obtaining history from patient or surrogate, ordering and performing treatments and interventions, ordering and review of laboratory studies, ordering and review of radiographic studies, pulse oximetry and re-evaluation of patient's condition.  Labs Review Labs Reviewed  I-STAT VENOUS BLOOD GAS, ED - Abnormal; Notable for the following:  pH, Ven 7.452 (*)    pO2, Ven 69.0 (*)    Bicarbonate 34.3 (*)    Acid-Base Excess 8.0 (*)    All other components within normal limits  CBG MONITORING, ED - Abnormal; Notable for the following:    Glucose-Capillary 221 (*)    All other components within normal limits  CULTURE, BLOOD (ROUTINE X 2)  CULTURE, BLOOD (ROUTINE X 2)  BASIC METABOLIC PANEL  CBC WITH DIFFERENTIAL/PLATELET  BRAIN NATRIURETIC PEPTIDE  I-STAT CHEM 8, ED  I-STAT TROPOININ, ED  I-STAT CG4 LACTIC ACID, ED    Imaging Review Dg Chest Port 1 View  01/01/2016  CLINICAL DATA:  Pt c/o shortness of breath 5-7 days, worse this morning. He denies chest pain. Was recently d/c'd from nursing home after 100 day stay for "a vascular problem with swelling everywhere". He states was dx'd w/ bronchitis EXAM: PORTABLE CHEST - 1 VIEW COMPARISON:  09/15/2015 FINDINGS: Low lung volumes. Bibasilar patchy atelectasis or infiltrate. Heart size upper limits normal for technique and degree of inspiration. Previous median sternotomy. No pneumothorax. Can't exclude small pleural effusions. IMPRESSION: 1. Low lung volumes with bibasilar atelectasis or infiltrate. 2. Previous median sternotomy. Electronically Signed   By: Corlis Leak M.D.   On: 01/01/2016  11:06   I have personally reviewed and evaluated these images and lab results as part of my medical decision-making.   EKG Interpretation   Date/Time:  Sunday January 01 2016 10:17:04 EST Ventricular Rate:  114 PR Interval:  163 QRS Duration: 146 QT Interval:  343 QTC Calculation: 472 R Axis:   146 Text Interpretation:  Sinus or ectopic atrial tachycardia RBBB and LPFB  Baseline wander in lead(s) V2 V6 Confirmed by Lincoln Brigham [phone]) on  01/01/2016 10:49:16 AM      MDM   Final diagnoses:  Shortness of breath   Pt here with increased SOB over last 7 days. Pt lethargic, tacphyneic with hypoxia on ED arrival with cool extremities.  Concern for sepsis vs cardiogenic shock vs PE.  Started on bipap for respiratory support.  He developed hypotension in the ED and was provided IVF bolus and treated with empiric abx for potential sepsis. Unable to rule out PE due to underlying kidney disease, treated empirically for possible PE with heparin and stat echo ordered to further eval his cardiac function.  Critical care consulted regarding admission given his acute respiratory failure with hypotension.      Tilden Fossa, MD 01/02/16 [phone]

## 2016-01-01 NOTE — ED Notes (Signed)
RT at bedside applying BiPap per Dr. Madilyn Hook.

## 2016-01-01 NOTE — Progress Notes (Signed)
VASCULAR LAB PRELIMINARY  PRELIMINARY  PRELIMINARY  PRELIMINARY  Bilateral lower extremity venous duplex completed.    Preliminary report:  Bilateral:  No evidence of DVT, superficial thrombosis, or Baker's Cyst. Peroneal veins bilaterally difficult to visualize.  Shafin Pollio, RVT, RDMS 01/01/2016, 3:07 PM

## 2016-01-01 NOTE — H&P (Signed)
PULMONARY / CRITICAL CARE MEDICINE   Name: Antonio [name] MRN: [id number] DOB: August 21, 1951    ADMISSION DATE:  01/01/2016  REFERRING MD:  EDP  CHIEF COMPLAINT:  Respiratory failure   HISTORY OF PRESENT ILLNESS:   65yo male with hx dCHF, CKD, morbid obesity, OSA non compliant with CPAP, severe AS s/p bioprosthetic aortic valve replacement 2013.  Just d/c 3/1 from SNF where he was doing rehab after a hospitalization for acute on chronic CHF.  He presents 3/5 with increased SOB, cough.  In ER was hypotensive despite 3L fluids, mild respiratory distress and hypoxia requiring bipap.  PCCM called to admit.   Per ex-wife/caretaker he was treated for bronchitis with prednisone and abx prior to d/c from SNF.  He has not had fevers, chills, purulent sputum.  BLE edema is at baseline.  Denies hemoptysis, chest pain, abd pain, n/v/d.   PAST MEDICAL HISTORY :  He  has a past medical history of Blindness of right eye; MRSA infection; Anxiety; Aortic stenosis; HTN (hypertension); HLD (hyperlipidemia); Gout; Chronic diastolic heart failure (HCC); DM2 (diabetes mellitus, type 2) (HCC); OSA on CPAP; Hepatitis; Arthritis; and History of acute renal failure.  PAST SURGICAL HISTORY: He  has past surgical history that includes Lumbar laminectomy (10/30/1983); Aortic valve replacement (11/29/2011); right heart catheterization (N/A, 11/28/2011); Cardiac valve replacement; Incision and drainage abscess (Left, 08/2005); Back surgery; Cardiac catheterization; and Cardiac catheterization (N/A, 10/14/2015).  Allergies  Allergen Reactions  . Other Other (See Comments)    Salt causes "extreme" edema.    No current facility-administered medications on file prior to encounter.   Current Outpatient Prescriptions on File Prior to Encounter  Medication Sig  . atorvastatin (LIPITOR) 40 MG tablet Take 40 mg by mouth daily at 6 PM.   . colchicine 0.6 MG tablet Take 0.6 mg by mouth daily.   . febuxostat (ULORIC) 40 MG tablet Take  40 mg by mouth daily.   . fenofibrate 54 MG tablet Take 54 mg by mouth daily.  . hydrocerin (EUCERIN) CREA Apply 1 application topically 2 (two) times daily.  Marland Kitchen HYDROcodone-acetaminophen (NORCO) 7.5-325 MG tablet Take 1 tablet by mouth every 4 (four) hours as needed for moderate pain.  Marland Kitchen lisinopril (PRINIVIL,ZESTRIL) 5 MG tablet Take 1 tablet (5 mg total) by mouth daily.  Marland Kitchen NOVOLOG 100 UNIT/ML injection Inject 30-40 Units into the skin 3 (three) times daily with meals. Sliding scale  . potassium chloride SA (K-DUR,KLOR-CON) 20 MEQ tablet Take 3 tablets (60 mEq total) by mouth daily. Take 3 tabs in AM and 2 tabs in PM  . torsemide (DEMADEX) 20 MG tablet Take 100 mg by mouth 2 (two) times daily. Take 100 mg (5 tablets) in the morning and 80 mg (4 tablets) in the afternoon  . aspirin 81 MG chewable tablet Chew 1 tablet (81 mg total) by mouth daily. (Patient not taking: Reported on 01/01/2016)  . docusate sodium (COLACE) 100 MG capsule Take 1 capsule (100 mg total) by mouth 2 (two) times daily. (Patient not taking: Reported on 01/01/2016)    FAMILY HISTORY:  His indicated that his mother is deceased. He indicated that his father is deceased.   SOCIAL HISTORY: He  reports that he has never smoked. He has never used smokeless tobacco. He reports that he drinks alcohol. He reports that he uses illicit drugs (Other-see comments).  REVIEW OF SYSTEMS:   As per HPI - All other systems reviewed and were neg.    SUBJECTIVE:    VITAL SIGNS:  BP 104/73 mmHg  Pulse 93  Temp(Src) 98 F (36.7 C) (Oral)  Resp 25  SpO2 99%  HEMODYNAMICS:    VENTILATOR SETTINGS: Vent Mode:  [-]  FiO2 (%):  [40 %] 40 %  INTAKE / OUTPUT:    PHYSICAL EXAMINATION: General:  Morbidly obese male, NAD on bipap  Neuro:  Drowsy but easy to arouse, answers questions appropriately, MAE  HEENT:  Mm moist, no JVD, bipap mask  Cardiovascular:  s1s2 no m/r/g Lungs:  resps even non labored on Bipap, diminished bases, few  scattered rhonchi R>L  Abdomen:  Obese, soft, non tender, +bs  Musculoskeletal:  2-3+ BLE edema to knees, stasis ulcers R>L, erythema R>L  LABS:  BMET  Recent Labs Lab 01/01/16 1204  NA 135  K 4.4  CL 96*  CO2 28  BUN 37*  CREATININE 1.63*  GLUCOSE 225*    Electrolytes  Recent Labs Lab 01/01/16 1204  CALCIUM 8.4*    CBC  Recent Labs Lab 01/01/16 1108  WBC 13.0*  HGB 15.2  HCT 45.6  PLT 248    Coag's No results for input(s): APTT, INR in the last 168 hours.  Sepsis Markers  Recent Labs Lab 01/01/16 1204  LATICACIDVEN 1.35    ABG No results for input(s): PHART, PCO2ART, PO2ART in the last 168 hours.  Liver Enzymes No results for input(s): AST, ALT, ALKPHOS, BILITOT, ALBUMIN in the last 168 hours.  Cardiac Enzymes No results for input(s): TROPONINI, PROBNP in the last 168 hours.  Glucose  Recent Labs Lab 01/01/16 1116  GLUCAP 221*    Imaging Dg Chest Port 1 View  01/01/2016  CLINICAL DATA:  Pt c/o shortness of breath 5-7 days, worse this morning. He denies chest pain. Was recently d/c'd from nursing home after 100 day stay for "a vascular problem with swelling everywhere". He states was dx'd w/ bronchitis EXAM: PORTABLE CHEST - 1 VIEW COMPARISON:  09/15/2015 FINDINGS: Low lung volumes. Bibasilar patchy atelectasis or infiltrate. Heart size upper limits normal for technique and degree of inspiration. Previous median sternotomy. No pneumothorax. Can't exclude small pleural effusions. IMPRESSION: 1. Low lung volumes with bibasilar atelectasis or infiltrate. 2. Previous median sternotomy. Electronically Signed   By: Corlis Leak M.D.   On: 01/01/2016 11:06     STUDIES:  2D echo 3/5>>>  CULTURES: BCx2 3/5>>> Urine 3/5>>>  ANTIBIOTICS: Vanc 3/5>>> Zosyn 3/5>>>  SIGNIFICANT EVENTS:   LINES/TUBES:   DISCUSSION: 65yo male with hypoxic respiratory failure in setting known dCHF, untreated OSA +/- URI or other infectious etiology given  hypotension.   ASSESSMENT / PLAN:  PULMONARY Acute Hypoxic respiratory failure - likely multifactorial in setting untreated OSA/OHS, heart failure, URI v less likely HCAP. Also ?PE in setting obesity, sedentary, acute onset hypoxia out of proportion to CXR.  P:   bipap for now  F/u CXR  Empiric abx as above  Heparin as below   CARDIOVASCULAR Chronic dCHF  Severe AS s/p valve replacement 2013  R/o PE  SIRS/Hypotension - improved with volume.  ?infectious source.  P:  BLE venous dopplers  Echo now  Trend troponin  Cont home torsemide  Consider heart failure team assist (sees [facility] as outpt)  empiric heparin gtt for r/o PE -- hold off on CTA chest for now with CKD  Repeat lactate, pct   RENAL CKD - Baseline Scr ~1.8 P:   F/u chem  Monitor UOP  Home torsemide as above   GASTROINTESTINAL No active issue  P:   NPO  on bipap   HEMATOLOGIC No active issue  P:  F/u chem  Heparin gtt   INFECTIOUS ?SIRS - mild hypotension, mild leukocytosis, cough.  ?HCAP  P:   Broad spectrum abx as above  Pan culture  ??mildly cellulitic RLE  Trend pct   ENDOCRINE DM    P:   SSI  Half dose home levemir   NEUROLOGIC AMS - improved.   P:   Hold sedating medications  bipap as above  abg if mental status change    FAMILY  - Updates:  Pt, ex-wife updated at bedside.   - Inter-disciplinary family meet or Palliative Care meeting due by:  3/12    Dirk Dress, NP 01/01/2016  4:13 PM Pager: [phone] or [phone]

## 2016-01-01 NOTE — Progress Notes (Signed)
  Echocardiogram 2D Echocardiogram has been performed.  Leta Jungling M 01/01/2016, 1:55 PM

## 2016-01-01 NOTE — Progress Notes (Signed)
Hypoglycemic Event  CBG: 64  Treatment: D50 IV 25 mL  Symptoms: Shaky  Follow-up CBG: Time:2220 CBG Result:93  Possible Reasons for Event: Unknown      [place] Dani P Wofford

## 2016-01-01 NOTE — ED Notes (Signed)
Critical care MD at bedside 

## 2016-01-02 ENCOUNTER — Inpatient Hospital Stay (HOSPITAL_COMMUNITY): Payer: Medicare Other

## 2016-01-02 DIAGNOSIS — I5032 Chronic diastolic (congestive) heart failure: Secondary | ICD-10-CM

## 2016-01-02 DIAGNOSIS — N184 Chronic kidney disease, stage 4 (severe): Secondary | ICD-10-CM

## 2016-01-02 DIAGNOSIS — G4733 Obstructive sleep apnea (adult) (pediatric): Secondary | ICD-10-CM

## 2016-01-02 DIAGNOSIS — J9602 Acute respiratory failure with hypercapnia: Secondary | ICD-10-CM

## 2016-01-02 DIAGNOSIS — R4182 Altered mental status, unspecified: Secondary | ICD-10-CM

## 2016-01-02 DIAGNOSIS — J9620 Acute and chronic respiratory failure, unspecified whether with hypoxia or hypercapnia: Secondary | ICD-10-CM

## 2016-01-02 DIAGNOSIS — J9601 Acute respiratory failure with hypoxia: Secondary | ICD-10-CM

## 2016-01-02 LAB — BASIC METABOLIC PANEL
ANION GAP: 8 (ref 5–15)
BUN: 24 mg/dL — ABNORMAL HIGH (ref 6–20)
CALCIUM: 8.4 mg/dL — AB (ref 8.9–10.3)
CO2: 30 mmol/L (ref 22–32)
Chloride: 106 mmol/L (ref 101–111)
Creatinine, Ser: 1.25 mg/dL — ABNORMAL HIGH (ref 0.61–1.24)
GFR calc non Af Amer: 59 mL/min — ABNORMAL LOW (ref >60)
GLUCOSE: 75 mg/dL (ref 65–99)
POTASSIUM: 4.2 mmol/L (ref 3.5–5.1)
Sodium: 144 mmol/L (ref 135–145)

## 2016-01-02 LAB — URINE CULTURE: Culture: NO GROWTH

## 2016-01-02 LAB — GLUCOSE, CAPILLARY
GLUCOSE-CAPILLARY: 184 mg/dL — AB (ref 65–99)
GLUCOSE-CAPILLARY: 251 mg/dL — AB (ref 65–99)
GLUCOSE-CAPILLARY: 46 mg/dL — AB (ref 65–99)
GLUCOSE-CAPILLARY: 58 mg/dL — AB (ref 65–99)
GLUCOSE-CAPILLARY: 63 mg/dL — AB (ref 65–99)
GLUCOSE-CAPILLARY: 85 mg/dL (ref 65–99)
Glucose-Capillary: 69 mg/dL (ref 65–99)
Glucose-Capillary: 88 mg/dL (ref 65–99)
Glucose-Capillary: 126 mg/dL — ABNORMAL HIGH (ref 65–99)

## 2016-01-02 LAB — CBC
HEMATOCRIT: 46.3 % (ref 39.0–52.0)
Hemoglobin: 13.5 g/dL (ref 13.0–17.0)
MCH: 25.1 pg — ABNORMAL LOW (ref 26.0–34.0)
MCHC: 29.2 g/dL — AB (ref 30.0–36.0)
MCV: 86.1 fL (ref 78.0–100.0)
PLATELETS: 190 10*3/uL (ref 150–400)
RBC: 5.38 MIL/uL (ref 4.22–5.81)
RDW: 16.6 % — AB (ref 11.5–15.5)
WBC: 12 10*3/uL — AB (ref 4.0–10.5)

## 2016-01-02 LAB — BRAIN NATRIURETIC PEPTIDE: B NATRIURETIC PEPTIDE 5: 173.7 pg/mL — AB (ref 0.0–100.0)

## 2016-01-02 LAB — HEPARIN LEVEL (UNFRACTIONATED)
HEPARIN UNFRACTIONATED: 0.39 [IU]/mL (ref 0.30–0.70)
HEPARIN UNFRACTIONATED: 0.32 [IU]/mL (ref 0.30–0.70)

## 2016-01-02 LAB — HEMOGLOBIN A1C
Hgb A1c MFr Bld: 10.9 % — ABNORMAL HIGH (ref 4.8–5.6)
Mean Plasma Glucose: 266 mg/dL

## 2016-01-02 LAB — PHOSPHORUS: PHOSPHORUS: 3.5 mg/dL (ref 2.5–4.6)

## 2016-01-02 LAB — TROPONIN I: Troponin I: 0.04 ng/mL — ABNORMAL HIGH (ref <0.031)

## 2016-01-02 LAB — MRSA PCR SCREENING: MRSA by PCR: POSITIVE — AB

## 2016-01-02 LAB — MAGNESIUM: Magnesium: 2.3 mg/dL (ref 1.7–2.4)

## 2016-01-02 MED ORDER — DEXTROSE-NACL 5-0.9 % IV SOLN
INTRAVENOUS | Status: DC
  Administered 2016-01-02: 50 mL/h via INTRAVENOUS

## 2016-01-02 MED ORDER — CETYLPYRIDINIUM CHLORIDE 0.05 % MT LIQD
7.0000 mL | Freq: Two times a day (BID) | OROMUCOSAL | Status: DC
  Administered 2016-01-02 – 2016-01-08 (×7): 7 mL via OROMUCOSAL

## 2016-01-02 MED ORDER — PANTOPRAZOLE SODIUM 40 MG PO TBEC
40.0000 mg | DELAYED_RELEASE_TABLET | Freq: Every day | ORAL | Status: DC
  Administered 2016-01-02 – 2016-01-08 (×7): 40 mg via ORAL
  Filled 2016-01-02 (×7): qty 1

## 2016-01-02 MED ORDER — CHLORHEXIDINE GLUCONATE 0.12 % MT SOLN
15.0000 mL | Freq: Two times a day (BID) | OROMUCOSAL | Status: DC
  Administered 2016-01-02 – 2016-01-09 (×5): 15 mL via OROMUCOSAL
  Filled 2016-01-02 (×7): qty 15

## 2016-01-02 MED ORDER — TECHNETIUM TC 99M DIETHYLENETRIAME-PENTAACETIC ACID: 30.1000 | Freq: Once | INTRAVENOUS | Status: DC | PRN

## 2016-01-02 MED ORDER — DEXTROSE 50 % IV SOLN
INTRAVENOUS | Status: AC
  Administered 2016-01-02: 50 mL
  Filled 2016-01-02: qty 50

## 2016-01-02 MED ORDER — MUPIROCIN 2 % EX OINT
1.0000 | TOPICAL_OINTMENT | Freq: Two times a day (BID) | CUTANEOUS | Status: AC
Start: 2016-01-02 — End: 2016-01-06
  Administered 2016-01-02 – 2016-01-06 (×10): 1 via NASAL
  Filled 2016-01-02 (×3): qty 22

## 2016-01-02 MED ORDER — TECHNETIUM TO 99M ALBUMIN AGGREGATED
4.0500 | Freq: Once | INTRAVENOUS | Status: AC | PRN
  Administered 2016-01-02: 4 via INTRAVENOUS

## 2016-01-02 MED ORDER — DEXTROSE 10 % IV SOLN
INTRAVENOUS | Status: DC
  Administered 2016-01-02: 50 mL/h via INTRAVENOUS

## 2016-01-02 MED ORDER — CHLORHEXIDINE GLUCONATE CLOTH 2 % EX PADS
6.0000 | MEDICATED_PAD | Freq: Every day | CUTANEOUS | Status: AC
Start: 2016-01-02 — End: 2016-01-06
  Administered 2016-01-02 – 2016-01-06 (×5): 6 via TOPICAL

## 2016-01-02 MED ORDER — HEPARIN SODIUM (PORCINE) 5000 UNIT/ML IJ SOLN
5000.0000 [IU] | Freq: Three times a day (TID) | INTRAMUSCULAR | Status: DC
Start: 2016-01-02 — End: 2016-01-09
  Administered 2016-01-02 – 2016-01-09 (×21): 5000 [IU] via SUBCUTANEOUS
  Filled 2016-01-02 (×20): qty 1

## 2016-01-02 NOTE — Progress Notes (Signed)
eLink Physician-Brief Progress Note Patient Name: Antonio [name] DOB: August 04, 1951 MRN: [id number]   Date of Service  01/02/2016  HPI/Events of Note  Hypoglycemia - Blood glucose = 46. Patient did not receive Levemir according to bedside nurse. Now on D5 0.9 NaCl at 50 mL/hour.   eICU Interventions  Will change to D10W to run at 50 mL/hour.     Intervention Category Major Interventions: Other:  Sarah Baez Dennard Nip 01/02/2016, 4:13 AM

## 2016-01-02 NOTE — Progress Notes (Signed)
ANTICOAGULATION CONSULT NOTE   Pharmacy Consult for  Heparin Indication: PE  Allergies  Allergen Reactions  . Other Other (See Comments)    Salt causes "extreme" edema.    Patient Measurements: Weight: (!) 370 lb (167.831 kg) Height: 179 cm (on 10/14/15) IBW: 73 kg Heparin Dosing Weight: 118 kg  Vital Signs: Temp: 97.6 F (36.4 C) (03/06 1225) Temp Source: Oral (03/06 1225) BP: 126/77 mmHg (03/06 1100) Pulse Rate: 100 (03/06 1100)  Labs:  Recent Labs  01/01/16 1108 01/01/16 1204 01/01/16 1940 01/01/16 2222 01/02/16 0653 01/02/16 0856 01/02/16 1207  HGB 15.2  --   --   --   --  13.5  --   HCT 45.6  --   --   --   --  46.3  --   PLT 248  --   --   --   --  190  --   HEPARINUNFRC <0.10*  --   --  0.32 0.39  --  0.32  CREATININE  --  1.63*  --   --  1.25*  --   --   TROPONINI  --   --  0.05*  --   --  0.04*  --     Estimated Creatinine Clearance: 93 mL/min (by C-G formula based on Cr of 1.25).  Assessment: [name] with r/o  PE on empiric IV heparin. Heparin level is therapeutic on current rate- low end of goal. CTA on hold due to CKD. LE dopplers negative for DVT.    Goal of Therapy:  Heparin level 0.3-0.7 units/ml  Monitor platelets by anticoagulation protocol: Yes   Plan:  Increase Heparin to 2300 units/hr to keep at goal Daily heparin level and CBC Follow-up anticoagulation plan  Link Snuffer, PharmD, BCPS Clinical Pharmacist [phone] 01/02/2016, 1:15 PM

## 2016-01-02 NOTE — Progress Notes (Signed)
Inpatient Diabetes Program Recommendations  AACE/ADA: New Consensus Statement on Inpatient Glycemic Control (2015)  Target Ranges:  Prepandial:   less than 140 mg/dL      Peak postprandial:   less than 180 mg/dL (1-2 hours)      Critically ill patients:  140 - 180 mg/dL   Review of Glycemic Control:  Results for Antonio [name], Antonio [name] (MRN [id number]) as of 01/02/2016 11:20  Ref. Range 01/01/2016 11:16 01/01/2016 18:12 01/01/2016 20:16 01/01/2016 21:40 01/01/2016 22:20 01/02/2016 00:31 01/02/2016 00:57 01/02/2016 04:04 01/02/2016 04:54  Glucose-Capillary Latest Ref Range: 65-99 mg/dL 356 (H) 94 76 64 (L) 93 63 (L) 69 46 (L) 88  Results for Antonio [name], Antonio [name] (MRN [id number]) as of 01/02/2016 11:20  Ref. Range 01/01/2016 17:15  Hemoglobin A1C Latest Ref Range: 4.8-5.6 % 10.9 (H)   Diabetes history: Type 2 diabetes Outpatient Diabetes medications: Levemir 60 units bid Current orders for Inpatient glycemic control:  Levemir 30 units bid, Novolog resistant q 4 hours Inpatient Diabetes Program Recommendations:    Please consider holding Levemir insulin until blood sugar greater than 150 mg/dL.  Will follow.  Thanks, Antonio Meager, RN, BC-ADM Inpatient Diabetes Coordinator Pager [phone] (8a-5p)

## 2016-01-02 NOTE — Progress Notes (Signed)
PULMONARY / CRITICAL CARE MEDICINE   Name: Antonio [name] MRN: [id number] DOB: July 02, 1951    ADMISSION DATE:  01/01/2016  REFERRING MD:  EDP  CHIEF COMPLAINT:  Respiratory failure   HISTORY OF PRESENT ILLNESS:   65yo male with hx dCHF, CKD, morbid obesity, OSA non compliant with CPAP, severe AS s/p bioprosthetic aortic valve replacement 2013.  Just d/c 3/1 from SNF where he was doing rehab after a hospitalization for acute on chronic CHF.  He presents 3/5 with increased SOB, cough.  In ER was hypotensive despite 3L fluids, mild respiratory distress and hypoxia requiring bipap.  PCCM called to admit.   Per ex-wife/caretaker he was treated for bronchitis with prednisone and abx prior to d/c from SNF.  He has not had fevers, chills, purulent sputum.  BLE edema is at baseline.  Denies hemoptysis, chest pain, abd pain, n/v/d.   PAST MEDICAL HISTORY :  He  has a past medical history of Blindness of right eye; MRSA infection; Anxiety; Aortic stenosis; HTN (hypertension); HLD (hyperlipidemia); Gout; Chronic diastolic heart failure (HCC); DM2 (diabetes mellitus, type 2) (HCC); OSA on CPAP; Hepatitis; Arthritis; and History of acute renal failure.  PAST SURGICAL HISTORY: He  has past surgical history that includes Lumbar laminectomy (10/30/1983); Aortic valve replacement (11/29/2011); right heart catheterization (N/A, 11/28/2011); Cardiac valve replacement; Incision and drainage abscess (Left, 08/2005); Back surgery; Cardiac catheterization; and Cardiac catheterization (N/A, 10/14/2015).  Allergies  Allergen Reactions  . Other Other (See Comments)    Salt causes "extreme" edema.    No current facility-administered medications on file prior to encounter.   Current Outpatient Prescriptions on File Prior to Encounter  Medication Sig  . atorvastatin (LIPITOR) 40 MG tablet Take 40 mg by mouth daily at 6 PM.   . colchicine 0.6 MG tablet Take 0.6 mg by mouth daily.   . febuxostat (ULORIC) 40 MG tablet Take  40 mg by mouth daily.   . fenofibrate 54 MG tablet Take 54 mg by mouth daily.  . hydrocerin (EUCERIN) CREA Apply 1 application topically 2 (two) times daily.  Marland Kitchen HYDROcodone-acetaminophen (NORCO) 7.5-325 MG tablet Take 1 tablet by mouth every 4 (four) hours as needed for moderate pain.  Marland Kitchen lisinopril (PRINIVIL,ZESTRIL) 5 MG tablet Take 1 tablet (5 mg total) by mouth daily.  Marland Kitchen NOVOLOG 100 UNIT/ML injection Inject 30-40 Units into the skin 3 (three) times daily with meals. Sliding scale  . potassium chloride SA (K-DUR,KLOR-CON) 20 MEQ tablet Take 3 tablets (60 mEq total) by mouth daily. Take 3 tabs in AM and 2 tabs in PM  . torsemide (DEMADEX) 20 MG tablet Take 100 mg by mouth 2 (two) times daily. Take 100 mg (5 tablets) in the morning and 80 mg (4 tablets) in the afternoon  . aspirin 81 MG chewable tablet Chew 1 tablet (81 mg total) by mouth daily. (Patient not taking: Reported on 01/01/2016)  . docusate sodium (COLACE) 100 MG capsule Take 1 capsule (100 mg total) by mouth 2 (two) times daily. (Patient not taking: Reported on 01/01/2016)    FAMILY HISTORY:  His indicated that his mother is deceased. He indicated that his father is deceased.   SOCIAL HISTORY: He  reports that he has never smoked. He has never used smokeless tobacco. He reports that he drinks alcohol. He reports that he uses illicit drugs (Other-see comments).  REVIEW OF SYSTEMS:   As per HPI - All other systems reviewed and were neg.    SUBJECTIVE:    VITAL SIGNS:  BP 126/77 mmHg  Pulse 100  Temp(Src) 97.6 F (36.4 C) (Oral)  Resp 21  Wt 167.831 kg (370 lb)  SpO2 92%  HEMODYNAMICS:    VENTILATOR SETTINGS: Vent Mode:  [-]  FiO2 (%):  [40 %] 40 %  INTAKE / OUTPUT: I/O last 3 completed shifts: In: 3640.8 [I.V.:3640.8] Out: 1000 [Urine:1000]  PHYSICAL EXAMINATION: General:  Morbidly obese male, NAD on bipap  Neuro:  Drowsy but easy to arouse, answers questions appropriately, MAE  HEENT:  Mm moist, no JVD, bipap  mask  Cardiovascular:  s1s2 no m/r/g Lungs:  resps even non labored on Bipap, diminished bases, few scattered rhonchi R>L  Abdomen:  Obese, soft, non tender, +bs  Musculoskeletal:  2-3+ BLE edema to knees, stasis ulcers R>L, erythema R>L  LABS:  BMET  Recent Labs Lab 01/01/16 1204 01/02/16 0653  NA 135 144  K 4.4 4.2  CL 96* 106  CO2 28 30  BUN 37* 24*  CREATININE 1.63* 1.25*  GLUCOSE 225* 75    Electrolytes  Recent Labs Lab 01/01/16 1204 01/02/16 0653  CALCIUM 8.4* 8.4*  MG  --  2.3  PHOS  --  3.5    CBC  Recent Labs Lab 01/01/16 1108 01/02/16 0856  WBC 13.0* 12.0*  HGB 15.2 13.5  HCT 45.6 46.3  PLT 248 190    Coag's No results for input(s): APTT, INR in the last 168 hours.  Sepsis Markers  Recent Labs Lab 01/01/16 1204 01/01/16 1653 01/01/16 1719 01/01/16 1940  LATICACIDVEN 1.35 2.1* 2.30*  --   PROCALCITON  --   --   --  <0.10    ABG No results for input(s): PHART, PCO2ART, PO2ART in the last 168 hours.  Liver Enzymes No results for input(s): AST, ALT, ALKPHOS, BILITOT, ALBUMIN in the last 168 hours.  Cardiac Enzymes  Recent Labs Lab 01/01/16 1940 01/02/16 0856  TROPONINI 0.05* 0.04*    Glucose  Recent Labs Lab 01/02/16 0031 01/02/16 0057 01/02/16 0404 01/02/16 0454 01/02/16 0805 01/02/16 0900  GLUCAP 63* 69 46* 88 58* 85    Imaging Dg Chest Port 1 View  01/02/2016  CLINICAL DATA:  Respiratory failure EXAM: PORTABLE CHEST 1 VIEW COMPARISON:  01/01/2016 FINDINGS: Cardiac shadow is enlarged but stable. Postsurgical changes are again seen. The lungs are well aerated bilaterally with bibasilar atelectatic changes. No focal infiltrate is seen. No acute bony abnormality is noted. IMPRESSION: Bibasilar atelectatic changes stable from the previous exam. Electronically Signed   By: Alcide Clever M.D.   On: 01/02/2016 07:13     STUDIES:  2D echo 3/5>>>  CULTURES: BCx2 3/5>>> Urine 3/5>>>  ANTIBIOTICS: Vanc 3/5>>> Zosyn  3/5>>>  SIGNIFICANT EVENTS:   LINES/TUBES:  I reviewed CXR myself, bibasilar atelectasis.  DISCUSSION: 65yo male with hypoxic respiratory failure in setting known dCHF, untreated OSA +/- URI or other infectious etiology given hypotension.   ASSESSMENT / PLAN:  PULMONARY Acute Hypoxic respiratory failure - likely multifactorial in setting untreated OSA/OHS, heart failure, URI v less likely HCAP.  RV dilated concerning for PE. P:   Change BiPAP to CPAP while asleep F/u CXR  Empiric abx as above  Heparin as below  Ordered V/Q scan, if negative then will d/c heparin.  CARDIOVASCULAR Chronic dCHF  Severe AS s/p valve replacement 2013  R/o PE  SIRS/Hypotension - improved with volume.  ?infectious source.  P:  BLE venous dopplers negative Echo with dilated RV Trend troponin  Cont home torsemide  Cardiology consult called to Dr.  McLean. Empiric heparin gtt for r/o PE -- VQ Repeat lactate, pct   RENAL CKD - Baseline Scr ~1.8 P:   F/u chem  Monitor UOP  Home torsemide as above   GASTROINTESTINAL No active issue  P:   Heart healthy diabetic diet ordered.  HEMATOLOGIC No active issue  P:  F/u chem  Heparin gtt   INFECTIOUS ?SIRS - mild hypotension, mild leukocytosis, cough.  ?HCAP  P:   Broad spectrum abx as above  Pan culture  Trend pct   ENDOCRINE DM    P:   SSI  Half dose home levemir   NEUROLOGIC AMS - improved.   P:   Hold sedating medications  ABG if mental status change    FAMILY  - Updates:  Pt updated at bedside.   - Inter-disciplinary family meet or Palliative Care meeting due by:  3/12  Transfer to tele and to [facility] service with PCCM off 3/7.  Discussed with [facility]-MD and Cardiologist.  Alyson Reedy, M.D. [facility] Pulmonary/Critical Care Medicine. Pager: [phone]. After hours pager: [phone].

## 2016-01-02 NOTE — Progress Notes (Signed)
ANTICOAGULATION CONSULT NOTE   Pharmacy Consult for  Heparin Indication: PE  Allergies  Allergen Reactions  . Other Other (See Comments)    Salt causes "extreme" edema.    Patient Measurements: Weight: (!) 370 lb (167.831 kg) Height: 179 cm (on 10/14/15) IBW: 73 kg Heparin Dosing Weight: 118 kg  Vital Signs: Temp: 98.3 F (36.8 C) (03/06 0405) Temp Source: Oral (03/06 0405) BP: 132/64 mmHg (03/06 0630) Pulse Rate: 103 (03/06 0600)  Labs:  Recent Labs  01/01/16 1108 01/01/16 1204 01/01/16 1940 01/01/16 2222 01/02/16 0653  HGB 15.2  --   --   --   --   HCT 45.6  --   --   --   --   PLT 248  --   --   --   --   HEPARINUNFRC <0.10*  --   --  0.32 0.39  CREATININE  --  1.63*  --   --  1.25*  TROPONINI  --   --  0.05*  --   --     Estimated Creatinine Clearance: 93 mL/min (by C-G formula based on Cr of 1.25).  Assessment: [name] with r/o  PE on empiric IV heparin. Heparin level is therapeutic on current rate. CTA on hold due to CKD. LE dopplers negative for DVT.    Goal of Therapy:  Heparin level 0.3-0.7 units/ml  Monitor platelets by anticoagulation protocol: Yes   Plan:  Continue Heparin 2250 units/hr Repeat heparin level in 6 hours to confirm.  Follow-up anticoagulation plan  Link Snuffer, PharmD, BCPS Clinical Pharmacist [phone] 01/02/2016, 7:37 AM

## 2016-01-02 NOTE — Progress Notes (Signed)
eLink Physician-Brief Progress Note Patient Name: Antonio [name] DOB: [birth year] MRN: [id number]   Date of Service  01/02/2016  HPI/Events of Note  Patient with episodes of hypoglycemia X 2 this evening.   eICU Interventions  Will order: 1. D5 0.9 NaCl to run IV at 50 mL/hour.     Intervention Category Major Interventions: Other:  [name],[name] 01/02/2016, 1:01 AM

## 2016-01-02 NOTE — Progress Notes (Signed)
Hypoglycemic Event  CBG: 63  Treatment: D50 IV 25 mL  Symptoms: None  Follow-up CBG: Time:0057 CBG Result:69  Possible Reasons for Event: Unknown  Comments/MD notified: Arsenio Loader MD    Scarlett Presto

## 2016-01-02 NOTE — Progress Notes (Signed)
Blood sugar recheck is 85

## 2016-01-02 NOTE — Progress Notes (Signed)
Hypoglycemic Event  CBG: 46  Treatment: D50 IV 50 mL  Symptoms: None  Follow-up CBG: Time 0454 CBG Result:88  Possible Reasons for Event: Unknown  Comments/MD notified: MD Monico Blitz P Wofford

## 2016-01-02 NOTE — Progress Notes (Signed)
eLink Physician-Brief Progress Note Patient Name: BAYDEN JEANNOT DOB: 1951/08/05 MRN: [id number]   Date of Service  01/02/2016  HPI/Events of Note  Patient's VQ scan shows low probability for PE.  eICU Interventions  1. DC heparin drip 2. Heparin subcutaneous every 8 hours for DVT prophylaxis     Intervention Category Major Interventions: Other:  Lawanda Cousins 01/02/2016, 9:39 PM

## 2016-01-02 NOTE — Progress Notes (Signed)
Patient had hypoglycemic event with blood sugar of 58. Dextrose 50% given. Will recheck blood sugar per protocol.

## 2016-01-02 NOTE — Consult Note (Signed)
Advanced Heart Failure Team Consult Note  Referring Physician: Dr Molli Knock Primary Physician: Dr. Leonette Most [facility]) Primary Cardiologist:  Dr. Shirlee Latch   Reason for Consultation: A/C diastolic CHF, A/C hypoxic respiratory failure.   HPI:    Antonio [name] is a 65 yo with bioprosthetic AVR, chronic diastolic CHF, CKD and morbid obesity presents for CHF clinic followup. Last echo in 10/16 showed normal bioprosthetic aortic valve and normal EF. He was admitted in 10/16 with volume overload and diuresed. He was sent home on torsemide 80 qam/40 qpm. He sees a nephrologist in [facility]. He was admitted again in 11/16 with acute on chronic diastolic CHF and inability to do ADLs at home. He was diuresed and discharged to a rehab facility.   Was last seen in HF clinic 11/03/15 and remained volume overloaded.  Torsemide increased to 100 mg BID and low salt/fluid restriction orders sent to facility.   [facility] 12[facility]/15.  Presented to [facility] 01/01/16 with worsening SOB and cough. He was hypotensive with SBP in 70s in the ER despite 3L fluids and had ild respiratory distress with hypoxia requiring bipap.  Ex/wife caretaker stated he was treated for bronchitis with prednisone and ABX prior to d/c from SNF. He denied fevers, chills, sputum. Has chronic BLE edema. Pertinent labs on admission include K 4.4, creatinine 1.63, BNP 173.7, WBC 13, Troponin flat. Afebrile. CXR with bibasilar atelectatic changes and infiltrates. He was more awake and response once on BiPAP.   Echo 01/01/16 EF 60-65% with Grade 2 DD. RV function severely reduced. Though with new Right heart strain.   He states Friday night into Saturday morning he had gotten "stuck" in his chair, and dropped the remote so couldn't make any adjustments overnight.  He was that way from 0130 - 0630 until he decided to call someone.  He states he had worsening SOB during and prior to this event. Felt like he just  couldn't get air and like he was "going to die."  As above he was treated for bronchitis prior to leaving [employer]. States he has been taking his medications as directed.  He was 409 lbs when he went into [employer], and was 369 lbs prior to coming into the hospital.   Review of Systems: [y] = yes,  = no   General: Weight gain ; Weight loss ; Anorexia ; Fatigue [y]; Fever [y]; Chills ; Weakness   Cardiac: Chest pain/pressure ; Resting SOB [y]; Exertional SOB [y]; Orthopnea ; Pedal Edema ; Palpitations ; Syncope ; Presyncope ; Paroxysmal nocturnal dyspnea[ ]   Pulmonary: Cough [y]; Wheezing[ ] ; Hemoptysis[ ] ; Sputum ; Snoring   GI: Vomiting[ ] ; Dysphagia[ ] ; Melena[ ] ; Hematochezia ; Heartburn[ ] ; Abdominal pain ; Constipation ; Diarrhea ; BRBPR   GU: Hematuria[ ] ; Dysuria ; Nocturia[ ]   Vascular: Pain in legs with walking ; Pain in feet with lying flat ; Non-healing sores ; Stroke ; TIA ; Slurred speech ;  Neuro: Headaches[ ] ; Vertigo[ ] ; Seizures[ ] ; Paresthesias[ ] ;Blurred vision ; Diplopia ; Vision changes   Ortho/Skin: Arthritis [y]; Joint pain [y]; Muscle pain ; Joint swelling ; Back Pain ; Rash   Psych: Depression[ ] ; Anxiety[ ]   Heme: Bleeding problems ; Clotting disorders ; Anemia   Endocrine: Diabetes [ ] ; Thyroid dysfunction[ ]   Home Medications Prior to Admission medications   Medication Sig Start Date End Date Taking? Authorizing Provider  allopurinol (ZYLOPRIM) 100 MG tablet Take 100 mg by mouth 2 (two) times daily.   Yes Historical Provider, MD  aspirin 325 MG tablet Take 325 mg by mouth daily.   Yes Historical Provider, MD  atorvastatin (LIPITOR) 40 MG tablet Take 40 mg by mouth daily at 6 PM.  12/05/14  Yes Historical Provider, MD  colchicine 0.6 MG tablet Take 0.6 mg by mouth daily.  09/01/14  Yes Historical Provider, MD  febuxostat (ULORIC) 40 MG tablet Take 40 mg by  mouth daily.    Yes Historical Provider, MD  fenofibrate 54 MG tablet Take 54 mg by mouth daily. 05/04/15  Yes Historical Provider, MD  ferrous sulfate 325 (65 FE) MG tablet Take 325 mg by mouth daily with breakfast.   Yes Historical Provider, MD  hydrocerin (EUCERIN) CREA Apply 1 application topically 2 (two) times daily. 09/19/15  Yes [name], PA-C  HYDROcodone-acetaminophen (NORCO) 7.5-325 MG tablet Take 1 tablet by mouth every 4 (four) hours as needed for moderate pain.   Yes Historical Provider, MD  hydrOXYzine (ATARAX/VISTARIL) 25 MG tablet Take 25 mg by mouth every 6 (six) hours as needed for itching.   Yes Historical Provider, MD  insulin detemir (LEVEMIR) 100 UNIT/ML injection Inject 60 Units into the skin 2 (two) times daily.   Yes Historical Provider, MD  lactobacillus acidophilus (BACID) TABS tablet Take 2 tablets by mouth 2 (two) times daily.   Yes Historical Provider, MD  lidocaine (LINDAMANTLE) 3 % CREA cream Apply 1 application topically 3 (three) times daily as needed (rash).   Yes Historical Provider, MD  lisinopril (PRINIVIL,ZESTRIL) 5 MG tablet Take 1 tablet (5 mg total) by mouth daily. 08/14/15  Yes Janetta Hora, PA-C  Multiple Vitamin (MULTIVITAMIN) tablet Take 1 tablet by mouth daily.   Yes Historical Provider, MD  NOVOLOG 100 UNIT/ML injection Inject 30-40 Units into the skin 3 (three) times daily with meals. Sliding scale 05/12/15  Yes Historical Provider, MD  polyethylene glycol (MIRALAX / GLYCOLAX) packet Take 17 g by mouth 2 (two) times daily.   Yes Historical Provider, MD  potassium chloride SA (K-DUR,KLOR-CON) 20 MEQ tablet Take 3 tablets (60 mEq total) by mouth daily. Take 3 tabs in AM and 2 tabs in PM 11/10/15  Yes Amy D Clegg, NP  temazepam (RESTORIL) 30 MG capsule Take 30 mg by mouth at bedtime.   Yes Historical Provider, MD  torsemide (DEMADEX) 20 MG tablet Take 100 mg by mouth 2 (two) times daily. Take 100 mg (5 tablets) in the morning and 80 mg (4  tablets) in the afternoon   Yes Historical Provider, MD  vitamin C (ASCORBIC ACID) 500 MG tablet Take 1,000 mg by mouth 2 (two) times daily.   Yes Historical Provider, MD  aspirin 81 MG chewable tablet Chew 1 tablet (81 mg total) by mouth daily. Patient not taking: Reported on 01/01/2016 06/21/15   [name], PA-C  docusate sodium (COLACE) 100 MG capsule Take 1 capsule (100 mg total) by mouth 2 (two) times daily. Patient not taking: Reported on 01/01/2016 09/19/15   [name], PA-C    Past Medical History: Past Medical History  Diagnosis Date  . Blindness of right eye     a. due to amblyopia as child  . Hx MRSA infection     a. thigh abcess  2006  . Anxiety   . Aortic stenosis     a. s/p AVR 11/29/11  . HTN (hypertension)   . HLD (hyperlipidemia)   . Gout   . Chronic diastolic heart failure (HCC)     a. Echo 08/11/2015 LVEF 55-60% with normal RV function  . DM2 (diabetes mellitus, type 2) (HCC)   . OSA on CPAP   . Hepatitis   . Arthritis   . History of acute renal failure     Past Surgical History: Past Surgical History  Procedure Laterality Date  . Lumbar laminectomy  10/30/1983  . Aortic valve replacement  11/29/2011    Procedure: AORTIC VALVE REPLACEMENT (AVR);  Surgeon: [name], MD;  Location: [facility] OR;  Service: Open Heart Surgery;  Laterality: N/A;  with nitric oxide  . Right heart catheterization N/A 11/28/2011    Procedure: RIGHT HEART CATH;  Surgeon: [name], MD;  Location[facility] CATH LAB;  Service: Cardiovascular;  Laterality: N/A;  . Cardiac valve replacement    . Incision and drainage abscess Left 08/2005    "upper/inner thigh; that's where the MRSA was"  . Back surgery    . Cardiac catheterization    . Cardiac catheterization N/A 10/14/2015    Procedure: Right Heart Cath;  Surgeon: [name], MD;  Location: [facility] INVASIVE CV LAB;  Service: Cardiovascular;  Laterality: N/A;    Family History: Family History  Problem  Relation Age of Onset  . Heart attack Father   . Cancer Mother     Social History: Social History   Social History  . Marital Status: Divorced    Spouse Name: N/A  . Number of Children: N/A  . Years of Education: N/A   Occupational History  . retired travelling Engineer, site    Social History Main Topics  . Smoking status: Never Smoker   . Smokeless tobacco: Never Used  . Alcohol Use: Yes     Comment: 02/18/2015 "once or twice/yr I'll have a cocktail"      rare  . Drug Use: Yes    Special: Other-see comments  . Sexual Activity: Not Currently   Other Topics Concern  . None   Social History Narrative    Allergies:  Allergies  Allergen Reactions  . Other Other (See Comments)    Salt causes "extreme" edema.    Objective:    Vital Signs:   Temp:  [97.3 F (36.3 C)-98.7 F (37.1 C)] 97.6 F (36.4 C) (03/06 1225) Pulse Rate:  [88-110] 100 (03/06 1100) Resp:  [16-31] 21 (03/06 1100) BP: (81-156)/(54-139) 126/77 mmHg (03/06 1100) SpO2:  [92 %-100 %] 92 % (03/06 1100) FiO2 (%):  [40 %] 40 % (03/06 0000) Weight:  [370 lb (167.831 kg)-396 lb 13.3 oz (180 kg)] 370 lb (167.831 kg) (03/06 0455)    Weight change: Filed Weights   01/01/16 1800 01/02/16 0455  Weight: 396 lb 13.3 oz (180 kg) 370 lb (167.831 kg)    Intake/Output:   Intake/Output Summary (Last 24 hours) at 01/02/16 1303 Last data filed at 01/02/16 1100  Gross per 24 hour  Intake 2430.79 ml  Output   1225 ml  Net 1205.79 ml     Physical Exam: General:  Well appearing. No resp difficulty HEENT: normal Neck: supple. JVP difficult to assess, does not appear elevated. Carotids 2+ bilat; no bruits. No thyromegaly or nodule noted. Cor: PMI nondisplaced. Distant. RRR. No M/G/R noted. Lungs: Diminished throughout Abdomen: Morbidly obese, NT, ND, no HSM.  No bruits or masses. +BS  Extremities: no cyanosis, clubbing, rash. Trace ankle edema Neuro: alert & orientedx3, cranial nerves grossly intact. moves  all 4 extremities w/o difficulty. Affect pleasant  Telemetry: NSR  Labs: Basic Metabolic Panel:  Recent Labs Lab 01/01/16 1204 01/02/16 0653  NA 135 144  K 4.4 4.2  CL 96* 106  CO2 28 30  GLUCOSE 225* 75  BUN 37* 24*  CREATININE 1.63* 1.25*  CALCIUM 8.4* 8.4*  MG  --  2.3  PHOS  --  3.5    Liver Function Tests: No results for input(s): AST, ALT, ALKPHOS, BILITOT, PROT, ALBUMIN in the last 168 hours. No results for input(s): LIPASE, AMYLASE in the last 168 hours. No results for input(s): AMMONIA in the last 168 hours.  CBC:  Recent Labs Lab 01/01/16 1108 01/02/16 0856  WBC 13.0* 12.0*  NEUTROABS 9.0*  --   HGB 15.2 13.5  HCT 45.6 46.3  MCV 81.3 86.1  PLT 248 190    Cardiac Enzymes:  Recent Labs Lab 01/01/16 1940 01/02/16 0856  TROPONINI 0.05* 0.04*    BNP: BNP (last 3 results)  Recent Labs  10/04/15 1044 01/01/16 1108 01/02/16 0652  BNP 13.5 196.0* 173.7*    ProBNP (last 3 results) No results for input(s): PROBNP in the last 8760 hours.   CBG:  Recent Labs Lab 01/02/16 0057 01/02/16 0404 01/02/16 0454 01/02/16 0805 01/02/16 0900  GLUCAP 69 46* 88 58* 85    Coagulation Studies: No results for input(s): LABPROT, INR in the last 72 hours.  Other results: EKG: 01/01/16 Junctional Rhythm rate of 110.   Imaging: Dg Chest Port 1 View  01/02/2016  CLINICAL DATA:  Respiratory failure EXAM: PORTABLE CHEST 1 VIEW COMPARISON:  01/01/2016 FINDINGS: Cardiac shadow is enlarged but stable. Postsurgical changes are again seen. The lungs are well aerated bilaterally with bibasilar atelectatic changes. No focal infiltrate is seen. No acute bony abnormality is noted. IMPRESSION: Bibasilar atelectatic changes stable from the previous exam. Electronically Signed   By: Alcide Clever M.D.   On: 01/02/2016 07:13   Dg Chest Port 1 View  01/01/2016  CLINICAL DATA:  Pt c/o shortness of breath 5-7 days, worse this morning. He denies chest pain. Was recently d/c'd  from nursing home after 100 day stay for "a vascular problem with swelling everywhere". He states was dx'd w/ bronchitis EXAM: PORTABLE CHEST - 1 VIEW COMPARISON:  09/15/2015 FINDINGS: Low lung volumes. Bibasilar patchy atelectasis or infiltrate. Heart size upper limits normal for technique and degree of inspiration. Previous median sternotomy. No pneumothorax. Can't exclude small pleural effusions. IMPRESSION: 1. Low lung volumes with bibasilar atelectasis or infiltrate. 2. Previous median sternotomy. Electronically Signed   By: Corlis Leak M.D.   On: 01/01/2016 11:06      Medications:     Current Medications: . antiseptic oral rinse  7 mL Mouth Rinse q12n4p  . aspirin  325 mg Oral Daily  . atorvastatin  40 mg Oral q1800  . chlorhexidine  15 mL Mouth Rinse BID  . Chlorhexidine Gluconate Cloth  6 each Topical Q0600  . colchicine  0.6 mg Oral Daily  . insulin aspart  0-20 Units Subcutaneous 6 times per day  . insulin detemir  30 Units Subcutaneous BID  . multivitamin with minerals  1 tablet Oral Daily  . mupirocin ointment  1 application Nasal BID  . pantoprazole (PROTONIX) IV  40 mg Intravenous QHS  . piperacillin-tazobactam (ZOSYN)  IV  3.375 g Intravenous 3  times per day  . sodium chloride  1,000 mL Intravenous Once  . torsemide  80 mg Oral BID  . vancomycin  1,250 mg Intravenous Q12H     Infusions: . dextrose 50 mL/hr at 01/02/16 0600  . heparin 2,250 Units/hr (01/02/16 0600)      Assessment/Plan   1. Acute respiratory failure requiring BiPAP. - Unclear etiology.  Seems to be CO2 retention in ? Setting of untreated OSA/OHS.  BNP un-impressive and fluid status stable on exam today despite net positive since admission.  - Now getting CPAP at night. - On Empiric ABX per CCM  - VQ scan pending.  2. Chronic diastolic CHF - Echo 01/01/16 60-65% with grade 2 diastolic dysfunction. Severely reduced RV function (mild/mod on Echo in 01/2015) - His fluid status is stable. Continue  current meds/home torsemide. - He, suprisingly, looks pretty good overall. Is ~30 lbs down since the last time we saw him.  - His RV does look worse than previous Echos. VQ scan pending to rule out PE 3. AKI on CKD stage III - Improved from yesterday with fluid.  - Continue to follow closely 4. S/p Bioprosthetic AV 5. Morbid obesity - ? If CO2 retention 2/2 untreated OSA/OHS. - Continued to lose weight would most likely help with his disease state.   His CHF looks stable for now. Appreciate the consult. We will follow with you.   Length of Stay: 1  [name] PA-C 01/02/2016, 1:03 PM  Advanced Heart Failure Team Pager [phone] (M-F; 7a - 4p)  Please contact [facility] for night-coverage after hours (4p -7a ) and weekends on [email or web address]  Patient seen and examined with Otilio Saber, PA-C. We discussed all aspects of the encounter. I agree with the assessment and plan as stated above.   Volume status looks quite good despite fluid resuscitation. CXR without infiltrate. Suspect inciting event may have been CO2 retention/narcosis. Echo reviewed personally. RV worse than most recent echo but relatively unchanged from 4/16 echo. Agree with LE u/s and VQ but my overall suspicion for acute PE is low. Would resume po diuretics. Will need PT consult to assess needs on d/c.  Emonii Wienke,MD 9:05 PM

## 2016-01-02 NOTE — Progress Notes (Signed)
ICU UR Completed. Carrell Palmatier, RN, BSN.  [phone]  

## 2016-01-02 NOTE — Progress Notes (Signed)
Patient is currently resting comfortably on 4L nasal cannula.  Not in any distress at this time.  Bipap not needed at this time.  Will continue to monitor.

## 2016-01-03 DIAGNOSIS — J189 Pneumonia, unspecified organism: Secondary | ICD-10-CM

## 2016-01-03 LAB — BASIC METABOLIC PANEL
ANION GAP: 7 (ref 5–15)
BUN: 19 mg/dL (ref 6–20)
CO2: 32 mmol/L (ref 22–32)
Calcium: 8.3 mg/dL — ABNORMAL LOW (ref 8.9–10.3)
Chloride: 101 mmol/L (ref 101–111)
Creatinine, Ser: 1.33 mg/dL — ABNORMAL HIGH (ref 0.61–1.24)
GFR calc Af Amer: >60 mL/min (ref >60)
GFR calc non Af Amer: 55 mL/min — ABNORMAL LOW (ref >60)
GLUCOSE: 197 mg/dL — AB (ref 65–99)
POTASSIUM: 4 mmol/L (ref 3.5–5.1)
Sodium: 140 mmol/L (ref 135–145)

## 2016-01-03 LAB — GLUCOSE, CAPILLARY
GLUCOSE-CAPILLARY: 175 mg/dL — AB (ref 65–99)
GLUCOSE-CAPILLARY: 154 mg/dL — AB (ref 65–99)
GLUCOSE-CAPILLARY: 233 mg/dL — AB (ref 65–99)
Glucose-Capillary: 176 mg/dL — ABNORMAL HIGH (ref 65–99)
Glucose-Capillary: 183 mg/dL — ABNORMAL HIGH (ref 65–99)
Glucose-Capillary: 191 mg/dL — ABNORMAL HIGH (ref 65–99)

## 2016-01-03 LAB — CBC
HEMATOCRIT: 41.9 % (ref 39.0–52.0)
Hemoglobin: 12.9 g/dL — ABNORMAL LOW (ref 13.0–17.0)
MCH: 26.4 pg (ref 26.0–34.0)
MCHC: 30.8 g/dL (ref 30.0–36.0)
MCV: 85.7 fL (ref 78.0–100.0)
Platelets: 235 10*3/uL (ref 150–400)
RBC: 4.89 MIL/uL (ref 4.22–5.81)
RDW: 16.6 % — ABNORMAL HIGH (ref 11.5–15.5)
WBC: 9.2 10*3/uL (ref 4.0–10.5)

## 2016-01-03 LAB — PHOSPHORUS: Phosphorus: 3.5 mg/dL (ref 2.5–4.6)

## 2016-01-03 LAB — STREP PNEUMONIAE URINARY ANTIGEN: STREP PNEUMO URINARY ANTIGEN: NEGATIVE

## 2016-01-03 LAB — MAGNESIUM: Magnesium: 2 mg/dL (ref 1.7–2.4)

## 2016-01-03 MED ORDER — INSULIN DETEMIR 100 UNIT/ML ~~LOC~~ SOLN
25.0000 [IU] | Freq: Two times a day (BID) | SUBCUTANEOUS | Status: DC
  Administered 2016-01-03 – 2016-01-05 (×4): 25 [IU] via SUBCUTANEOUS
  Filled 2016-01-03 (×5): qty 0.25

## 2016-01-03 MED ORDER — FEBUXOSTAT 40 MG PO TABS
40.0000 mg | ORAL_TABLET | Freq: Every day | ORAL | Status: DC
  Administered 2016-01-03 – 2016-01-09 (×7): 40 mg via ORAL
  Filled 2016-01-03 (×7): qty 1

## 2016-01-03 MED ORDER — TORSEMIDE 20 MG PO TABS
100.0000 mg | ORAL_TABLET | Freq: Every day | ORAL | Status: DC
  Administered 2016-01-04 – 2016-01-05 (×2): 100 mg via ORAL
  Filled 2016-01-03 (×2): qty 5

## 2016-01-03 MED ORDER — ZOLPIDEM TARTRATE 5 MG PO TABS
5.0000 mg | ORAL_TABLET | Freq: Every evening | ORAL | Status: DC | PRN
  Administered 2016-01-03: 5 mg via ORAL
  Filled 2016-01-03: qty 1

## 2016-01-03 MED ORDER — ZOLPIDEM TARTRATE 5 MG PO TABS
5.0000 mg | ORAL_TABLET | Freq: Once | ORAL | Status: AC
  Administered 2016-01-03: 5 mg via ORAL
  Filled 2016-01-03: qty 1

## 2016-01-03 MED ORDER — TORSEMIDE 20 MG PO TABS
80.0000 mg | ORAL_TABLET | Freq: Every evening | ORAL | Status: DC
  Administered 2016-01-03 – 2016-01-04 (×2): 80 mg via ORAL
  Filled 2016-01-03 (×3): qty 4

## 2016-01-03 NOTE — Progress Notes (Signed)
eLink Physician-Brief Progress Note Patient Name: ULICES SASSE DOB: 1950/12/20 MRN: [id number]   Date of Service  01/03/2016  HPI/Events of Note  Patient requests sleeping aide.  eICU Interventions  Will order Ambien 5 mg PO now.      Intervention Category Minor Interventions: Routine modifications to care plan (e.g. PRN medications for pain, fever)  [name],Steven Eugene 01/03/2016, 1:12 AM

## 2016-01-03 NOTE — Progress Notes (Signed)
Pt unable to tolerate CPAP for more than 1 hour last night. O2 changed back to 3L [place]. Will continue to monitor.

## 2016-01-03 NOTE — Evaluation (Signed)
Physical Therapy Evaluation Patient Details Name: Antonio [name] MRN: [id number] DOB: 1950/12/30 Today's Date: 01/03/2016   History of Present Illness  65 yo male with respiratory distress and untreated sleep apnea, CHF, chronic LE edema and recent discharge from full 100 days in inpt rehab.  PMHx:  CHF, hypoventilation syndrome, hypotension,   Clinical Impression  Pt is having some issues with some skills that are difficult but can stand and transfer with some height on the bed, chair with minor help.  He is planning to get back home as he was in SNF for full 100 days and will likely transition straight home.  Plan to work on standing and his ability to judge his limits and safety with all mobility and planning movement.    Follow Up Recommendations Home health PT;Supervision - Intermittent    Equipment Recommendations  None recommended by PT    Recommendations for Other Services Rehab consult     Precautions / Restrictions Precautions Precautions: Fall (telemetry) Restrictions Weight Bearing Restrictions: No      Mobility  Bed Mobility Overal bed mobility: Modified Independent                Transfers Overall transfer level: Needs assistance Equipment used: Rolling walker (2 wheeled)             General transfer comment: cued hand placement but pt can stand up from higher surfaces  Ambulation/Gait Ambulation/Gait assistance: Min guard;Min assist Ambulation Distance (Feet): 4 Feet Assistive device: Rolling walker (2 wheeled) Gait Pattern/deviations: Step-to pattern;Wide base of support;Trunk flexed Gait velocity: reduced Gait velocity interpretation: Below normal speed for age/gender    Stairs            Wheelchair Mobility    Modified Rankin (Stroke Patients Only)       Balance                                             Pertinent Vitals/Pain Pain Assessment: No/denies pain    Home Living Family/patient expects to be  discharged to:: Private residence Living Arrangements: Alone Available Help at Discharge: Family;Available PRN/intermittently Type of Home: House Home Access: Stairs to enter Entrance Stairs-Rails: Right Entrance Stairs-Number of Steps: 3 Home Layout: One level Home Equipment: Walker - 2 wheels;Walker - 4 wheels;Shower seat;Bedside commode Additional Comments: has all the equipment from recent SNF admit    Prior Function Level of Independence: Independent with assistive device(s)         Comments: Has been driving and able to care for himself     Hand Dominance   Dominant Hand: Right    Extremity/Trunk Assessment   Upper Extremity Assessment: Overall WFL for tasks assessed           Lower Extremity Assessment: Overall WFL for tasks assessed      Cervical / Trunk Assessment: Normal  Communication   Communication: No difficulties  Cognition Arousal/Alertness: Awake/alert Behavior During Therapy: WFL for tasks assessed/performed Overall Cognitive Status: Within Functional Limits for tasks assessed                      General Comments General comments (skin integrity, edema, etc.): Pt is unable to handle cleaning himself up from [facility] but is definitely having awareness of what he will need to be able to do to get home    Exercises  Assessment/Plan    PT Assessment Patient needs continued PT services  PT Diagnosis Difficulty walking   PT Problem List Decreased range of motion;Decreased activity tolerance;Decreased mobility;Cardiopulmonary status limiting activity;Obesity;Decreased skin integrity  PT Treatment Interventions DME instruction;Gait training;Functional mobility training;Therapeutic activities;Therapeutic exercise;Balance training;Neuromuscular re-education;Patient/family education   PT Goals (Current goals can be found in the Care Plan section) Acute Rehab PT Goals Patient Stated Goal: to get home and be able to manage safely PT Goal  Formulation: With patient Time For Goal Achievement: 01/17/16 Potential to Achieve Goals: Good    Frequency Min 3X/week   Barriers to discharge Decreased caregiver support home alone     Co-evaluation               End of Session Equipment Utilized During Treatment: Oxygen Activity Tolerance: Patient tolerated treatment well;Patient limited by fatigue (poor sleep due to using bipap) Patient left: in bed;with call bell/phone within reach;with nursing/sitter in room Nurse Communication: Mobility status         Time: 1205-1300 PT Time Calculation (min) (ACUTE ONLY): 55 min   Charges:   PT Evaluation $PT Eval Moderate Complexity: 1 Procedure PT Treatments $Therapeutic Exercise: 8-22 mins $Therapeutic Activity: 23-37 mins   PT G Codes:        Ivar Drape 01/09/2016, 2:37 PM  Samul Dada, PT MS Acute Rehab Dept. Number: [facility] [id number] and [facility] [phone]

## 2016-01-03 NOTE — Progress Notes (Signed)
PT Cancellation Note  Patient Details Name: JAKEL CROUCH MRN: [id number] DOB: 11-26-1950   Cancelled Treatment:    Reason Eval/Treat Not Completed: Patient declined, no reason specified.  Will try later.   Ivar Drape 01/03/2016, 8:19 AM  Samul Dada, PT MS Acute Rehab Dept. Number: [facility] [id number] and [facility] [phone]

## 2016-01-03 NOTE — Progress Notes (Signed)
[facility] PROGRESS NOTE  ADRIANN BALLWEG UJW:[id number] DOB: 1951/04/15 DOA: 01/01/2016 PCP: [name], MD  Assessment/Plan: 65yo male with hx dCHF, CKD, morbid obesity, OSA non compliant with CPAP, severe AS s/p bioprosthetic aortic valve replacement 2013. Just d/c 3/1 from SNF where he was doing rehab after a hospitalization for acute on chronic CHF. He presents 3/5 with increased SOB, cough. In ER was hypotensive despite 3L fluids, mild respiratory distress and hypoxia requiring bipap. PCCM called to admit.   Per ex-wife/caretaker he was treated for bronchitis with prednisone and abx prior to d/c from SNF. He has not had fevers, chills, purulent sputum. BLE edema is at baseline. Denies hemoptysis, chest pain, abd pain, n/v/d.   Acute Hypoxic respiratory failure - likely multifactorial in setting untreated OSA/OHS, heart failure, URI v less likely HCAP.  RV dilated concerning for PE on ECHO--- was on heparin gtt whic was discontinue after V-Q scan showed low probability for PE>  CPAP while sleep  IV antibiotics to treat for Health care associated PNA>  Diuretics resume 3-07 by HF team.  BLE venous dopplers negative  Health care associated PNA;  Strep PNA negative.  WBC normalized.  Chest x ray on admission bilateral atelectasis, infiltrates.  Continue with Vancomycin and Zosyn.   Chronic Diastolic CHF , Severe AS s/p valve replacement 2013  Cont home torsemide  Cardiology consult called to Dr. Shirlee Latch. Echo with dilated RV  SIRS/Hypotension - improved with volume. ?infectious source.  Treating for PNA>   CKD -Stage III  Baseline Scr ~1.8 Cr at 1.25, better than baseline.  Monitor on torsemide.   Diabetes:  Decrease  levemir to 25 BID, he had hypoglycemia 3-06  Encephalopathy , AMS - improved. suspect related to hypercapnia, OSA>  Hold sedating medications  Resolved.   History of Gout: on colchicine and Uloric.   Code Status: Full Code.  Family  Communication: care discussed with patient.  Disposition Plan: remain inpatient.    Consultants:  HF team.   CMM admitted patient, transfer to [facility] 3-07  Procedures:    Antibiotics:  Vancomycin 3-05  Zosyn 3-05  HPI/Subjective: He is complaining of SOB, overall he feels better than when he came./  He is asking for his medications for gout. Also asking for sleeping pills.   Objective: Filed Vitals:   01/03/16 0023 01/03/16 0822  BP: 117/71 109/67  Pulse: 100 106  Temp: 97.9 F (36.6 C) 98.7 F (37.1 C)  Resp: 20 20    Intake/Output Summary (Last 24 hours) at 01/03/16 1102 Last data filed at 01/03/16 0953  Gross per 24 hour  Intake 1869.7 ml  Output   2925 ml  Net -1055.3 ml   Filed Weights   01/01/16 1800 01/02/16 0455 01/03/16 0709  Weight: 180 kg (396 lb 13.3 oz) 167.831 kg (370 lb) 169.464 kg (373 lb 9.6 oz)    Exam:   General:  NAD  Cardiovascular: S 1, S 2 RRR  Respiratory: Distant breath sound, bilateral crackles.   Abdomen: Bs resent, soft, obese  Musculoskeletal: bilateral trace edema.   Data Reviewed: Basic Metabolic Panel:  Recent Labs Lab 01/01/16 1204 01/02/16 0653 01/03/16 0531  NA 135 144 140  K 4.4 4.2 4.0  CL 96* 106 101  CO2 28 30 32  GLUCOSE 225* 75 197*  BUN 37* 24* 19  CREATININE 1.63* 1.25* 1.33*  CALCIUM 8.4* 8.4* 8.3*  MG  --  2.3 2.0  PHOS  --  3.5 3.5   Liver  Function Tests: No results for input(s): AST, ALT, ALKPHOS, BILITOT, PROT, ALBUMIN in the last 168 hours. No results for input(s): LIPASE, AMYLASE in the last 168 hours. No results for input(s): AMMONIA in the last 168 hours. CBC:  Recent Labs Lab 01/01/16 1108 01/02/16 0856 01/03/16 0531  WBC 13.0* 12.0* 9.2  NEUTROABS 9.0*  --   --   HGB 15.2 13.5 12.9*  HCT 45.6 46.3 41.9  MCV 81.3 86.1 85.7  PLT 248 190 235   Cardiac Enzymes:  Recent Labs Lab 01/01/16 1940 01/02/16 0856  TROPONINI 0.05* 0.04*   BNP (last 3 results)  Recent  Labs  10/04/15 1044 01/01/16 1108 01/02/16 0652  BNP 13.5 196.0* 173.7*    ProBNP (last 3 results) No results for input(s): PROBNP in the last 8760 hours.  CBG:  Recent Labs Lab 01/02/16 1718 01/02/16 1912 01/03/16 0028 01/03/16 0358 01/03/16 1009  GLUCAP 184* 251* 176* 175* 154*    Recent Results (from the past 240 hour(s))  Culture, blood (routine x 2)     Status: None (Preliminary result)   Collection Time: 01/01/16 11:42 AM  Result Value Ref Range Status   Specimen Description BLOOD RIGHT HAND  Final   Special Requests BOTTLES DRAWN AEROBIC AND ANAEROBIC 5CC  Final   Culture NO GROWTH 1 DAY  Final   Report Status PENDING  Incomplete  Culture, blood (routine x 2)     Status: None (Preliminary result)   Collection Time: 01/01/16 12:08 PM  Result Value Ref Range Status   Specimen Description BLOOD LEFT HAND  Final   Special Requests BOTTLES DRAWN AEROBIC AND ANAEROBIC 5CC  Final   Culture NO GROWTH 1 DAY  Final   Report Status PENDING  Incomplete  Urine culture     Status: None   Collection Time: 01/01/16  2:23 PM  Result Value Ref Range Status   Specimen Description URINE, RANDOM  Final   Special Requests NONE  Final   Culture NO GROWTH 1 DAY  Final   Report Status 01/02/2016 FINAL  Final  MRSA PCR Screening     Status: Abnormal   Collection Time: 01/02/16  4:59 AM  Result Value Ref Range Status   MRSA by PCR POSITIVE (A) NEGATIVE Final    Comment:        The GeneXpert MRSA Assay (FDA approved for NASAL specimens only), is one component of a comprehensive MRSA colonization surveillance program. It is not intended to diagnose MRSA infection nor to guide or monitor treatment for MRSA infections. RESULT CALLED TO, READ BACK BY AND VERIFIED WITH: HAYES,C RN [phone] 01/02/16 MITCHELL,L      Studies: Nm Pulmonary Perf And Vent  01/02/2016  CLINICAL DATA:  Shortness of breath for 4 days. History of aortic valve replacement. Evaluate for pulmonary embolism. EXAM:  NUCLEAR MEDICINE VENTILATION - PERFUSION LUNG SCAN TECHNIQUE: Ventilation images were obtained in multiple projections using inhaled aerosol Tc-65m DTPA. Perfusion images were obtained in multiple projections after intravenous injection of Tc-56m MAA. RADIOPHARMACEUTICALS:  30.1 millicurie Technetium-41m DTPA aerosol inhalation and 4.05 millicurie Technetium-16m MAA IV COMPARISON:  Portable chest same date. FINDINGS: Ventilation: Limited by body habitus and central clumping of the aerosol in the tracheobronchial tree. No focal ventilatory defects identified. Perfusion: No wedge shaped peripheral perfusion defects to suggest acute pulmonary embolism. The perfusion scan appears matched to the ventilatory exam. IMPRESSION: Low probability for acute pulmonary embolism. Electronically Signed   By: Carey Bullocks M.D.   On: 01/02/2016 17:12  Dg Chest Port 1 View  01/02/2016  CLINICAL DATA:  Respiratory failure EXAM: PORTABLE CHEST 1 VIEW COMPARISON:  01/01/2016 FINDINGS: Cardiac shadow is enlarged but stable. Postsurgical changes are again seen. The lungs are well aerated bilaterally with bibasilar atelectatic changes. No focal infiltrate is seen. No acute bony abnormality is noted. IMPRESSION: Bibasilar atelectatic changes stable from the previous exam. Electronically Signed   By: Alcide Clever M.D.   On: 01/02/2016 07:13    Scheduled Meds: . antiseptic oral rinse  7 mL Mouth Rinse q12n4p  . aspirin  325 mg Oral Daily  . atorvastatin  40 mg Oral q1800  . chlorhexidine  15 mL Mouth Rinse BID  . Chlorhexidine Gluconate Cloth  6 each Topical Q0600  . colchicine  0.6 mg Oral Daily  . heparin subcutaneous  5,000 Units Subcutaneous 3 times per day  . insulin aspart  0-20 Units Subcutaneous 6 times per day  . insulin detemir  30 Units Subcutaneous BID  . multivitamin with minerals  1 tablet Oral Daily  . mupirocin ointment  1 application Nasal BID  . pantoprazole  40 mg Oral QHS  . piperacillin-tazobactam  (ZOSYN)  IV  3.375 g Intravenous 3 times per day  . sodium chloride  1,000 mL Intravenous Once  . [START ON 01/04/2016] torsemide  100 mg Oral QPC breakfast  . torsemide  80 mg Oral QPM  . vancomycin  1,250 mg Intravenous Q12H   Continuous Infusions: . dextrose 50 mL/hr at 01/02/16 0600    Active Problems:   Respiratory failure (HCC)    Time spent: 35 minutes    Regalado, Belkys A  [facility] Pager [phone]. If 7PM-7AM, please contact night-coverage at [email or web address], password [email or web address] 01/03/2016, 11:02 AM  LOS: 2 days

## 2016-01-03 NOTE — Progress Notes (Signed)
Pt refusing cpap tonight, stating that his nose has been stuffy and running all day; wants to wear tomorrow night. Informed pt to call if he changed his mind tonight; he communicates understanding.

## 2016-01-03 NOTE — Progress Notes (Signed)
Advanced Heart Failure Rounding Note  Primary Physician: Dr. Leonette Most [facility]) Primary Cardiologist: Dr. Shirlee Latch   Subjective:    VQ scan low probability. DVT scan pending.   Feels OK.  Doesn't tolerate CPAP very well.  Says he has been sleeping in a reclining chair at home and feels like he gets good rest.  Does take naps sometimes during the day.    Weight up 3 lbs.  Watch for over-correction with IV fluids. Volume status OK currently.   Objective:   Weight Range: 373 lb 9.6 oz (169.464 kg) Body mass index is 52.83 kg/(m^2).   Vital Signs:   Temp:  [97.6 F (36.4 C)-98.7 F (37.1 C)] 97.9 F (36.6 C) (03/07 0023) Pulse Rate:  [100-110] 100 (03/07 0023) Resp:  [16-33] 20 (03/07 0023) BP: (95-144)/(51-115) 117/71 mmHg (03/07 0023) SpO2:  [92 %-100 %] 100 % (03/07 0023) Weight:  [373 lb 9.6 oz (169.464 kg)] 373 lb 9.6 oz (169.464 kg) (03/07 0709)    Weight change: Filed Weights   01/01/16 1800 01/02/16 0455 01/03/16 0709  Weight: 396 lb 13.3 oz (180 kg) 370 lb (167.831 kg) 373 lb 9.6 oz (169.464 kg)    Intake/Output:   Intake/Output Summary (Last 24 hours) at 01/03/16 0754 Last data filed at 01/03/16 0418  Gross per 24 hour  Intake 1799.7 ml  Output   2725 ml  Net -925.3 ml    Physical Exam General: Well appearing. No resp difficulty HEENT: normal Neck: supple. JVP difficult to assess, does not appear elevated. Carotids 2+ bilat; no bruits. No thyromegaly or nodule noted. Cor: PMI nondisplaced. Distant. RRR. No M/G/R noted. Lungs: Diminished throughout Abdomen: Morbidly obese, NT, ND, no HSM. No bruits or masses. +BS  Extremities: no cyanosis, clubbing, rash. Trace ankle edema Neuro: alert & orientedx3, cranial nerves grossly intact. moves all 4 extremities w/o difficulty. Affect pleasant  Telemetry: Reviewed personally, NSR  Labs: CBC  Recent Labs  01/01/16 1108 01/02/16 0856 01/03/16 0531  WBC 13.0* 12.0* 9.2  NEUTROABS 9.0*  --   --   HGB  15.2 13.5 12.9*  HCT 45.6 46.3 41.9  MCV 81.3 86.1 85.7  PLT 248 190 235   Basic Metabolic Panel  Recent Labs  01/02/16 0653 01/03/16 0531  NA 144 140  K 4.2 4.0  CL 106 101  CO2 30 32  GLUCOSE 75 197*  BUN 24* 19  CREATININE 1.25* 1.33*  CALCIUM 8.4* 8.3*  MG 2.3 2.0  PHOS 3.5 3.5   Liver Function Tests No results for input(s): AST, ALT, ALKPHOS, BILITOT, PROT, ALBUMIN in the last 72 hours. No results for input(s): LIPASE, AMYLASE in the last 72 hours. Cardiac Enzymes  Recent Labs  01/01/16 1940 01/02/16 0856  TROPONINI 0.05* 0.04*    BNP: BNP (last 3 results)  Recent Labs  10/04/15 1044 01/01/16 1108 01/02/16 0652  BNP 13.5 196.0* 173.7*    ProBNP (last 3 results) No results for input(s): PROBNP in the last 8760 hours.   D-Dimer No results for input(s): DDIMER in the last 72 hours. Hemoglobin A1C  Recent Labs  01/01/16 1715  HGBA1C 10.9*   Fasting Lipid Panel No results for input(s): CHOL, HDL, LDLCALC, TRIG, CHOLHDL, LDLDIRECT in the last 72 hours. Thyroid Function Tests No results for input(s): TSH, T4TOTAL, T3FREE, THYROIDAB in the last 72 hours.  Invalid input(s): FREET3  Other results:     Imaging/Studies:  Nm Pulmonary Perf And Vent  01/02/2016  CLINICAL DATA:  Shortness of  breath for 4 days. History of aortic valve replacement. Evaluate for pulmonary embolism. EXAM: NUCLEAR MEDICINE VENTILATION - PERFUSION LUNG SCAN TECHNIQUE: Ventilation images were obtained in multiple projections using inhaled aerosol Tc-39m DTPA. Perfusion images were obtained in multiple projections after intravenous injection of Tc-86m MAA. RADIOPHARMACEUTICALS:  30.1 millicurie Technetium-59m DTPA aerosol inhalation and 4.05 millicurie Technetium-1m MAA IV COMPARISON:  Portable chest same date. FINDINGS: Ventilation: Limited by body habitus and central clumping of the aerosol in the tracheobronchial tree. No focal ventilatory defects identified. Perfusion: No  wedge shaped peripheral perfusion defects to suggest acute pulmonary embolism. The perfusion scan appears matched to the ventilatory exam. IMPRESSION: Low probability for acute pulmonary embolism. Electronically Signed   By: Carey Bullocks M.D.   On: 01/02/2016 17:12   Dg Chest Port 1 View  01/02/2016  CLINICAL DATA:  Respiratory failure EXAM: PORTABLE CHEST 1 VIEW COMPARISON:  01/01/2016 FINDINGS: Cardiac shadow is enlarged but stable. Postsurgical changes are again seen. The lungs are well aerated bilaterally with bibasilar atelectatic changes. No focal infiltrate is seen. No acute bony abnormality is noted. IMPRESSION: Bibasilar atelectatic changes stable from the previous exam. Electronically Signed   By: Alcide Clever M.D.   On: 01/02/2016 07:13   Dg Chest Port 1 View  01/01/2016  CLINICAL DATA:  Pt c/o shortness of breath 5-7 days, worse this morning. He denies chest pain. Was recently d/c'd from nursing home after 100 day stay for "a vascular problem with swelling everywhere". He states was dx'd w/ bronchitis EXAM: PORTABLE CHEST - 1 VIEW COMPARISON:  09/15/2015 FINDINGS: Low lung volumes. Bibasilar patchy atelectasis or infiltrate. Heart size upper limits normal for technique and degree of inspiration. Previous median sternotomy. No pneumothorax. Can't exclude small pleural effusions. IMPRESSION: 1. Low lung volumes with bibasilar atelectasis or infiltrate. 2. Previous median sternotomy. Electronically Signed   By: Corlis Leak M.D.   On: 01/01/2016 11:06     Latest Echo  Latest Cath   Medications:     Scheduled Medications: . antiseptic oral rinse  7 mL Mouth Rinse q12n4p  . aspirin  325 mg Oral Daily  . atorvastatin  40 mg Oral q1800  . chlorhexidine  15 mL Mouth Rinse BID  . Chlorhexidine Gluconate Cloth  6 each Topical Q0600  . colchicine  0.6 mg Oral Daily  . heparin subcutaneous  5,000 Units Subcutaneous 3 times per day  . insulin aspart  0-20 Units Subcutaneous 6 times per day    . insulin detemir  30 Units Subcutaneous BID  . multivitamin with minerals  1 tablet Oral Daily  . mupirocin ointment  1 application Nasal BID  . pantoprazole  40 mg Oral QHS  . piperacillin-tazobactam (ZOSYN)  IV  3.375 g Intravenous 3 times per day  . sodium chloride  1,000 mL Intravenous Once  . torsemide  80 mg Oral BID  . vancomycin  1,250 mg Intravenous Q12H     Infusions: . dextrose 50 mL/hr at 01/02/16 0600     PRN Medications:  technetium TC 18M diethylenetriame-pentaacetic acid   Assessment/Plan   1. Acute respiratory failure requiring BiPAP. - Unclear etiology. 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   - Getting CPAP at night, but only tolerant for about an hour.  - On Empiric ABX per CCM  - VQ scan 01/02/16 with low probability for acute PE.  - LE [place] for DVT pending. 2. Chronic diastolic CHF - Echo 01/01/16 60-65% with grade 2 diastolic dysfunction. Severely reduced RV  function (mild/mod on Echo in 01/2015) -  Volume status stable despite fluid resuscitation.  Back on po meds. He is ~30 lbs down since the last time we saw him in clinic.  If weight is up again tomorrow may need dose of IV lasix.  - His RV does look worse than previous Echos. VQ scan low probability for acute PE.  3. AKI on CKD stage III - Stable to improved. Continue to follow closely 4. S/p Bioprosthetic AV 5. Morbid obesity - Continued to lose weight would most likely help with his disease state.  - PT to see today for conditioning.   Length of Stay: 2  [name] PA-C 01/03/2016, 7:54 AM  Advanced Heart Failure Team Pager [phone] (M-F; 7a - 4p)  Please contact [facility] for night-coverage after hours (4p -7a ) and weekends on [email or web address]  Patient seen with PA, agree with the above note.    I think that the reason for this admission was OHS/OSA with CO2 narcosis.  He has not been using CPAP at home, needs a new mask that he is able to tolerate at  night.   On exam, overall volume status looks ok. No more IV fluid.  Would put him on his home torsemide dose of 100 mg qam and 80 mg qpm.   [name] 01/03/2016 8:42 AM

## 2016-01-04 LAB — BASIC METABOLIC PANEL
Anion gap: 8 (ref 5–15)
BUN: 17 mg/dL (ref 6–20)
CALCIUM: 8.7 mg/dL — AB (ref 8.9–10.3)
CO2: 37 mmol/L — ABNORMAL HIGH (ref 22–32)
CREATININE: 1.3 mg/dL — AB (ref 0.61–1.24)
Chloride: 98 mmol/L — ABNORMAL LOW (ref 101–111)
GFR calc Af Amer: >60 mL/min (ref >60)
GFR calc non Af Amer: 56 mL/min — ABNORMAL LOW (ref >60)
Glucose, Bld: 74 mg/dL (ref 65–99)
POTASSIUM: 3.8 mmol/L (ref 3.5–5.1)
Sodium: 143 mmol/L (ref 135–145)

## 2016-01-04 LAB — GLUCOSE, CAPILLARY
GLUCOSE-CAPILLARY: 115 mg/dL — AB (ref 65–99)
GLUCOSE-CAPILLARY: 116 mg/dL — AB (ref 65–99)
GLUCOSE-CAPILLARY: 176 mg/dL — AB (ref 65–99)
GLUCOSE-CAPILLARY: 69 mg/dL (ref 65–99)
Glucose-Capillary: 175 mg/dL — ABNORMAL HIGH (ref 65–99)
Glucose-Capillary: 226 mg/dL — ABNORMAL HIGH (ref 65–99)
Glucose-Capillary: 327 mg/dL — ABNORMAL HIGH (ref 65–99)

## 2016-01-04 LAB — CBC
HCT: 42 % (ref 39.0–52.0)
Hemoglobin: 12.9 g/dL — ABNORMAL LOW (ref 13.0–17.0)
MCH: 26.3 pg (ref 26.0–34.0)
MCHC: 30.7 g/dL (ref 30.0–36.0)
MCV: 85.7 fL (ref 78.0–100.0)
PLATELETS: 236 10*3/uL (ref 150–400)
RBC: 4.9 MIL/uL (ref 4.22–5.81)
RDW: 16.3 % — ABNORMAL HIGH (ref 11.5–15.5)
WBC: 9.2 10*3/uL (ref 4.0–10.5)

## 2016-01-04 LAB — LEGIONELLA ANTIGEN, URINE

## 2016-01-04 MED ORDER — PREDNISONE 20 MG PO TABS
40.0000 mg | ORAL_TABLET | Freq: Every day | ORAL | Status: DC
  Administered 2016-01-04 – 2016-01-06 (×3): 40 mg via ORAL
  Filled 2016-01-04 (×3): qty 2

## 2016-01-04 MED ORDER — POTASSIUM CHLORIDE CRYS ER 20 MEQ PO TBCR
40.0000 meq | EXTENDED_RELEASE_TABLET | Freq: Every day | ORAL | Status: DC
  Administered 2016-01-04 – 2016-01-09 (×6): 40 meq via ORAL
  Filled 2016-01-04 (×6): qty 2

## 2016-01-04 MED ORDER — GUAIFENESIN ER 600 MG PO TB12
600.0000 mg | ORAL_TABLET | Freq: Two times a day (BID) | ORAL | Status: DC
  Administered 2016-01-04 – 2016-01-09 (×11): 600 mg via ORAL
  Filled 2016-01-04 (×12): qty 1

## 2016-01-04 MED ORDER — FLUTICASONE PROPIONATE 50 MCG/ACT NA SUSP
1.0000 | Freq: Every day | NASAL | Status: DC
  Administered 2016-01-04 – 2016-01-09 (×6): 1 via NASAL
  Filled 2016-01-04: qty 16

## 2016-01-04 MED ORDER — ZOLPIDEM TARTRATE 5 MG PO TABS
10.0000 mg | ORAL_TABLET | Freq: Every evening | ORAL | Status: DC | PRN
  Administered 2016-01-04: 10 mg via ORAL
  Filled 2016-01-04: qty 2

## 2016-01-04 NOTE — Progress Notes (Signed)
Patient ID: Antonio [name], male   DOB: [birth year], 65 y.o.   MRN: [id number]     Advanced Heart Failure Rounding Note  Primary Physician: Dr. Leonette Most [facility]) Primary Cardiologist: Dr. Shirlee Latch   Subjective:    VQ scan low probability, suspect no PE.  Still having trouble with CPAP.  Diuresed well yesterday on po torsemide.  Creatinine lower today.      Objective:   Weight Range: 369 lb 4.8 oz (167.513 kg) Body mass index is 52.22 kg/(m^2).   Vital Signs:   Temp:  [98.4 F (36.9 C)-98.5 F (36.9 C)] 98.4 F (36.9 C) (03/08 0520) Pulse Rate:  [99] 99 (03/08 0520) Resp:  [16-18] 16 (03/08 0520) BP: (113-127)/(70-81) 127/81 mmHg (03/08 0520) SpO2:  [94 %-97 %] 94 % (03/08 0520) Weight:  [369 lb 4.8 oz (167.513 kg)] 369 lb 4.8 oz (167.513 kg) (03/08 0520) Last BM Date: 01/02/16  Weight change: Filed Weights   01/02/16 0455 01/03/16 0709 01/04/16 0520  Weight: 370 lb (167.831 kg) 373 lb 9.6 oz (169.464 kg) 369 lb 4.8 oz (167.513 kg)    Intake/Output:   Intake/Output Summary (Last 24 hours) at 01/04/16 0827 Last data filed at 01/04/16 0516  Gross per 24 hour  Intake   2190 ml  Output   4525 ml  Net  -2335 ml    Physical Exam General: Well appearing. No resp difficulty HEENT: normal Neck: supple. JVP difficult to assess, does not appear elevated. Carotids 2+ bilat; no bruits. No thyromegaly or nodule noted. Cor: PMI nondisplaced. Distant. RRR. No M/G/R noted. Lungs: Diminished throughout Abdomen: Morbidly obese, NT, ND, no HSM. No bruits or masses. +BS  Extremities: no cyanosis, clubbing, rash. Trace ankle edema Neuro: alert & orientedx3, cranial nerves grossly intact. moves all 4 extremities w/o difficulty. Affect pleasant  Telemetry: Reviewed personally, NSR  Labs: CBC  Recent Labs  01/01/16 1108  01/03/16 0531 01/04/16 0515  WBC 13.0*  < > 9.2 9.2  NEUTROABS 9.0*  --   --   --   HGB 15.2  < > 12.9* 12.9*  HCT 45.6  < > 41.9 42.0  MCV 81.3  < > 85.7  85.7  PLT 248  < > 235 236  < > = values in this interval not displayed. Basic Metabolic Panel  Recent Labs  01/02/16 0653 01/03/16 0531 01/04/16 0515  NA 144 140 143  K 4.2 4.0 3.8  CL 106 101 98*  CO2 30 32 37*  GLUCOSE 75 197* 74  BUN 24* 19 17  CREATININE 1.25* 1.33* 1.30*  CALCIUM 8.4* 8.3* 8.7*  MG 2.3 2.0  --   PHOS 3.5 3.5  --    Liver Function Tests No results for input(s): AST, ALT, ALKPHOS, BILITOT, PROT, ALBUMIN in the last 72 hours. No results for input(s): LIPASE, AMYLASE in the last 72 hours. Cardiac Enzymes  Recent Labs  01/01/16 1940 01/02/16 0856  TROPONINI 0.05* 0.04*    BNP: BNP (last 3 results)  Recent Labs  10/04/15 1044 01/01/16 1108 01/02/16 0652  BNP 13.5 196.0* 173.7*    ProBNP (last 3 results) No results for input(s): PROBNP in the last 8760 hours.   D-Dimer No results for input(s): DDIMER in the last 72 hours. Hemoglobin A1C  Recent Labs  01/01/16 1715  HGBA1C 10.9*   Fasting Lipid Panel No results for input(s): CHOL, HDL, LDLCALC, TRIG, CHOLHDL, LDLDIRECT in the last 72 hours. Thyroid Function Tests No results for input(s): TSH, T4TOTAL, T3FREE, THYROIDAB  in the last 72 hours.  Invalid input(s): FREET3  Other results:     Imaging/Studies:  Nm Pulmonary Perf And Vent  01/02/2016  CLINICAL DATA:  Shortness of breath for 4 days. History of aortic valve replacement. Evaluate for pulmonary embolism. EXAM: NUCLEAR MEDICINE VENTILATION - PERFUSION LUNG SCAN TECHNIQUE: Ventilation images were obtained in multiple projections using inhaled aerosol Tc-71m DTPA. Perfusion images were obtained in multiple projections after intravenous injection of Tc-72m MAA. RADIOPHARMACEUTICALS:  30.1 millicurie Technetium-29m DTPA aerosol inhalation and 4.05 millicurie Technetium-40m MAA IV COMPARISON:  Portable chest same date. FINDINGS: Ventilation: Limited by body habitus and central clumping of the aerosol in the tracheobronchial tree. No  focal ventilatory defects identified. Perfusion: No wedge shaped peripheral perfusion defects to suggest acute pulmonary embolism. The perfusion scan appears matched to the ventilatory exam. IMPRESSION: Low probability for acute pulmonary embolism. Electronically Signed   By: Carey Bullocks M.D.   On: 01/02/2016 17:12    Latest Echo  Latest Cath   Medications:     Scheduled Medications: . antiseptic oral rinse  7 mL Mouth Rinse q12n4p  . aspirin  325 mg Oral Daily  . atorvastatin  40 mg Oral q1800  . chlorhexidine  15 mL Mouth Rinse BID  . Chlorhexidine Gluconate Cloth  6 each Topical Q0600  . colchicine  0.6 mg Oral Daily  . febuxostat  40 mg Oral Daily  . heparin subcutaneous  5,000 Units Subcutaneous 3 times per day  . insulin aspart  0-20 Units Subcutaneous 6 times per day  . insulin detemir  25 Units Subcutaneous BID  . multivitamin with minerals  1 tablet Oral Daily  . mupirocin ointment  1 application Nasal BID  . pantoprazole  40 mg Oral QHS  . piperacillin-tazobactam (ZOSYN)  IV  3.375 g Intravenous 3 times per day  . potassium chloride  40 mEq Oral Daily  . sodium chloride  1,000 mL Intravenous Once  . torsemide  100 mg Oral QPC breakfast  . torsemide  80 mg Oral QPM  . vancomycin  1,250 mg Intravenous Q12H    Infusions:    PRN Medications: technetium TC 51M diethylenetriame-pentaacetic acid, zolpidem   Assessment/Plan   1. 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  Also getting empiric treatment for HCAP. VQ scan 01/02/16 with low probability for acute PE.  2. Chronic diastolic CHF: Echo 01/01/16 60-65% with grade 2 diastolic dysfunction, severely reduced RV function (mild/mod on Echo in 01/2015). His RV does look worse than previous echoes. VQ scan low probability for acute PE.  Possibly RV failure from OHS/OSA. Volume status ok, continue current torsemide.  3. AKI on CKD stage III: Creatinine  better today.  4. S/p Bioprosthetic AV 5. Morbid obesity: Continued to lose weight would most likely help with his disease state.  - Needs ongoing PT work.  6. OHS/OSA: He needs CPAP at home, may need home oxygen.  7. Disposition: He wants to go back to SNF for rehab.  Think this would probably be a good idea as he has been at home alone and I am concerned that he will not do well there.    Length of Stay: 3  [name]  01/04/2016, 8:27 AM  Advanced Heart Failure Team Pager [phone] (M-F; 7a - 4p)  Please contact [facility] for night-coverage after hours (4p -7a ) and weekends on [email or web address]

## 2016-01-04 NOTE — Progress Notes (Signed)
0141 Read PT eval. Patient is not appropriate for our services at this time. Will follow progress with PT. Luetta Nutting RN BSN 01/04/2016 7:12 AM

## 2016-01-04 NOTE — Progress Notes (Signed)
Patient states rounding doctor stated they would change his ambien of 5mg  to 10 mg and the order has not changed. Notified on-call hospitalist to see if he is able to get a one time dose for tonight of 10mg  so that attending/rounding doctor can address that in tomorrow. Will continue to monitor patient to end of shift.

## 2016-01-04 NOTE — Progress Notes (Signed)
[facility] PROGRESS NOTE  METE PURDUM ZOX:[id number] DOB: 1951/03/02 DOA: 01/01/2016 PCP: [name], MD  Assessment/Plan: 65yo male with hx dCHF, CKD, morbid obesity, OSA non compliant with CPAP, severe AS s/p bioprosthetic aortic valve replacement 2013. Just d/c 3/1 from SNF where he was doing rehab after a hospitalization for acute on chronic CHF. He presents 3/5 with increased SOB, cough. In ER was hypotensive despite 3L fluids, mild respiratory distress and hypoxia requiring bipap. PCCM called to admit.   Per ex-wife/caretaker he was treated for bronchitis with prednisone and abx prior to d/c from SNF. He has not had fevers, chills, purulent sputum. BLE edema is at baseline. Denies hemoptysis, chest pain, abd pain, n/v/d.   Acute Hypoxic respiratory failure - likely multifactorial in setting untreated OSA/OHS, heart failure, URI v less likely HCAP.  RV dilated concerning for PE on ECHO--- was on heparin gtt whic was discontinue after V-Q scan showed low probability for PE>  CPAP while sleep  Continue with IV antibiotics to treat for Health care associated PNA>  Diuretics resume 3-07 by HF team.  BLE venous dopplers negative  Health care associated PNA;  Strep PNA negative.  WBC normalized.  Chest x ray on admission bilateral atelectasis, infiltrates.  Continue with Vancomycin and Zosyn.  Complaining of  Nasal congestion, start Flonase. Also Guaifenesin to help with secretions.   Chronic Diastolic CHF , Severe AS s/p valve replacement 2013  Cont home torsemide  Cardiology consult called to Dr. Shirlee Latch. Echo with dilated RV  SIRS/Hypotension - improved with volume. ?infectious source.  Treating for PNA>   CKD -Stage III  Baseline Scr ~1.8 Cr at 1.25, better than baseline.  Monitor on torsemide.   Diabetes:  Decrease  levemir to 25 BID, he had hypoglycemia 3-06  Encephalopathy , AMS - improved. suspect related to hypercapnia, OSA>  Hold sedating  medications  Resolved.   History of Gout: on colchicine and Uloric. Complaining of left knee pain.  Start low dose prednisone/   Code Status: Full Code.  Family Communication: care discussed with patient.  Disposition Plan: remain inpatient.    Consultants:  HF team.   CMM admitted patient, transfer to [facility] 3-07  Procedures:    Antibiotics:  Vancomycin 3-05  Zosyn 3-05  HPI/Subjective: SOB on exertion, complaining of left knee pain.  Nasal congestion.   Objective: Filed Vitals:   01/03/16 2009 01/04/16 0520  BP: 113/70 127/81  Pulse: 99 99  Temp: 98.5 F (36.9 C) 98.4 F (36.9 C)  Resp: 18 16    Intake/Output Summary (Last 24 hours) at 01/04/16 1016 Last data filed at 01/04/16 0931  Gross per 24 hour  Intake   2245 ml  Output   4375 ml  Net  -2130 ml   Filed Weights   01/02/16 0455 01/03/16 0709 01/04/16 0520  Weight: 167.831 kg (370 lb) 169.464 kg (373 lb 9.6 oz) 167.513 kg (369 lb 4.8 oz)    Exam:   General:  NAD  Cardiovascular: S 1, S 2 RRR  Respiratory: Distant breath sound, bilateral crackles.   Abdomen: Bs resent, soft, obese  Musculoskeletal: bilateral trace edema.   Data Reviewed: Basic Metabolic Panel:  Recent Labs Lab 01/01/16 1204 01/02/16 0653 01/03/16 0531 01/04/16 0515  NA 135 144 140 143  K 4.4 4.2 4.0 3.8  CL 96* 106 101 98*  CO2 28 30 32 37*  GLUCOSE 225* 75 197* 74  BUN 37* 24* 19 17  CREATININE 1.63* 1.25* 1.33* 1.30*  CALCIUM 8.4* 8.4* 8.3* 8.7*  MG  --  2.3 2.0  --   PHOS  --  3.5 3.5  --    Liver Function Tests: No results for input(s): AST, ALT, ALKPHOS, BILITOT, PROT, ALBUMIN in the last 168 hours. No results for input(s): LIPASE, AMYLASE in the last 168 hours. No results for input(s): AMMONIA in the last 168 hours. CBC:  Recent Labs Lab 01/01/16 1108 01/02/16 0856 01/03/16 0531 01/04/16 0515  WBC 13.0* 12.0* 9.2 9.2  NEUTROABS 9.0*  --   --   --   HGB 15.2 13.5 12.9* 12.9*  HCT 45.6 46.3  41.9 42.0  MCV 81.3 86.1 85.7 85.7  PLT 248 190 235 236   Cardiac Enzymes:  Recent Labs Lab 01/01/16 1940 01/02/16 0856  TROPONINI 0.05* 0.04*   BNP (last 3 results)  Recent Labs  10/04/15 1044 01/01/16 1108 01/02/16 0652  BNP 13.5 196.0* 173.7*    ProBNP (last 3 results) No results for input(s): PROBNP in the last 8760 hours.  CBG:  Recent Labs Lab 01/03/16 2044 01/04/16 0001 01/04/16 0427 01/04/16 0624 01/04/16 0748  GLUCAP 233* 176* 69 115* 116*    Recent Results (from the past 240 hour(s))  Culture, blood (routine x 2)     Status: None (Preliminary result)   Collection Time: 01/01/16 11:42 AM  Result Value Ref Range Status   Specimen Description BLOOD RIGHT HAND  Final   Special Requests BOTTLES DRAWN AEROBIC AND ANAEROBIC 5CC  Final   Culture NO GROWTH 2 DAYS  Final   Report Status PENDING  Incomplete  Culture, blood (routine x 2)     Status: None (Preliminary result)   Collection Time: 01/01/16 12:08 PM  Result Value Ref Range Status   Specimen Description BLOOD LEFT HAND  Final   Special Requests BOTTLES DRAWN AEROBIC AND ANAEROBIC 5CC  Final   Culture NO GROWTH 2 DAYS  Final   Report Status PENDING  Incomplete  Urine culture     Status: None   Collection Time: 01/01/16  2:23 PM  Result Value Ref Range Status   Specimen Description URINE, RANDOM  Final   Special Requests NONE  Final   Culture NO GROWTH 1 DAY  Final   Report Status 01/02/2016 FINAL  Final  MRSA PCR Screening     Status: Abnormal   Collection Time: 01/02/16  4:59 AM  Result Value Ref Range Status   MRSA by PCR POSITIVE (A) NEGATIVE Final    Comment:        The GeneXpert MRSA Assay (FDA approved for NASAL specimens only), is one component of a comprehensive MRSA colonization surveillance program. It is not intended to diagnose MRSA infection nor to guide or monitor treatment for MRSA infections. RESULT CALLED TO, READ BACK BY AND VERIFIED WITH: HAYES,C RN [phone] 01/02/16  MITCHELL,L      Studies: Nm Pulmonary Perf And Vent  01/02/2016  CLINICAL DATA:  Shortness of breath for 4 days. History of aortic valve replacement. Evaluate for pulmonary embolism. EXAM: NUCLEAR MEDICINE VENTILATION - PERFUSION LUNG SCAN TECHNIQUE: Ventilation images were obtained in multiple projections using inhaled aerosol Tc-61m DTPA. Perfusion images were obtained in multiple projections after intravenous injection of Tc-53m MAA. RADIOPHARMACEUTICALS:  30.1 millicurie Technetium-2m DTPA aerosol inhalation and 4.05 millicurie Technetium-44m MAA IV COMPARISON:  Portable chest same date. FINDINGS: Ventilation: Limited by body habitus and central clumping of the aerosol in the tracheobronchial tree. No focal ventilatory defects identified. Perfusion: No wedge shaped peripheral  perfusion defects to suggest acute pulmonary embolism. The perfusion scan appears matched to the ventilatory exam. IMPRESSION: Low probability for acute pulmonary embolism. Electronically Signed   By: Carey Bullocks M.D.   On: 01/02/2016 17:12    Scheduled Meds: . antiseptic oral rinse  7 mL Mouth Rinse q12n4p  . aspirin  325 mg Oral Daily  . atorvastatin  40 mg Oral q1800  . chlorhexidine  15 mL Mouth Rinse BID  . Chlorhexidine Gluconate Cloth  6 each Topical Q0600  . colchicine  0.6 mg Oral Daily  . febuxostat  40 mg Oral Daily  . fluticasone  1 spray Each Nare Daily  . guaiFENesin  600 mg Oral BID  . heparin subcutaneous  5,000 Units Subcutaneous 3 times per day  . insulin aspart  0-20 Units Subcutaneous 6 times per day  . insulin detemir  25 Units Subcutaneous BID  . multivitamin with minerals  1 tablet Oral Daily  . mupirocin ointment  1 application Nasal BID  . pantoprazole  40 mg Oral QHS  . piperacillin-tazobactam (ZOSYN)  IV  3.375 g Intravenous 3 times per day  . potassium chloride  40 mEq Oral Daily  . predniSONE  40 mg Oral Q breakfast  . sodium chloride  1,000 mL Intravenous Once  . torsemide  100  mg Oral QPC breakfast  . torsemide  80 mg Oral QPM  . vancomycin  1,250 mg Intravenous Q12H   Continuous Infusions:    Active Problems:   Respiratory failure (HCC)   PNA (pneumonia)    Time spent: 35 minutes    Tajuan Dufault A  [facility] Pager [phone]. If 7PM-7AM, please contact night-coverage at [email or web address], password [facility] 01/04/2016, 10:16 AM  LOS: 3 days

## 2016-01-04 NOTE — Progress Notes (Signed)
ANTIBIOTIC CONSULT NOTE - Consult   Pharmacy Consult for Vanc/zosyn Indication: sepsis / HCAP - from SNF  Allergies  Allergen Reactions  . Other Other (See Comments)    Salt causes "extreme" edema.    Patient Measurements: Height: 5\' 11"  (180.3 cm) Weight: (!) 369 lb 4.8 oz (167.513 kg) (scale B) IBW/kg (Calculated) : 75.3Weight: 180.1 kg (on 11/03/15) Height: 179 cm (on 10/14/15) IBW: 73 kg Heparin Dosing Weight: 118 kg  Vital Signs: Temp: 98.4 F (36.9 C) (03/08 0520) Temp Source: Oral (03/08 0520) BP: 127/81 mmHg (03/08 0520) Pulse Rate: 99 (03/08 0520)  Labs:  Recent Labs  01/01/16 1940 01/01/16 2222 01/02/16 9295  01/02/16 0856 01/02/16 1207 01/03/16 0531 01/04/16 0515  HGB  --   --   --   < > 13.5  --  12.9* 12.9*  HCT  --   --   --   --  46.3  --  41.9 42.0  PLT  --   --   --   --  190  --  235 236  HEPARINUNFRC  --  0.32 0.39  --   --  0.32  --   --   CREATININE  --   --  1.25*  --   --   --  1.33* 1.30*  TROPONINI 0.05*  --   --   --  0.04*  --   --   --   < > = values in this interval not displayed.  Estimated Creatinine Clearance: 89.9 mL/min (by C-G formula based on Cr of 1.3).   Medical History: Past Medical History  Diagnosis Date  . Blindness of right eye     a. due to amblyopia as child  . Hx MRSA infection     a. thigh abcess 2006  . Anxiety   . Aortic stenosis     a. s/p AVR 11/29/11  . HTN (hypertension)   . HLD (hyperlipidemia)   . Gout   . Chronic diastolic heart failure (HCC)     a. Echo 08/11/2015 LVEF 55-60% with normal RV function  . DM2 (diabetes mellitus, type 2) (HCC)   . OSA on CPAP   . Hepatitis   . Arthritis   . History of acute renal failure     Assessment: 65 yo M admitted 01/01/2016 with complaint of SOB over the last 7 days. 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 Pharmacy consulted to dose vancomycin/zosyn for sepsis.  Renal  function stable   Goal of Therapy:   Vancomycin trough 15-20 Monitor platelets by anticoagulation protocol: Yes   Plan:  Vancomycin 2500 mg x1 loading dose then 1250mg  q12 Zosyn 3.375 GM q8 EI  F/u LOT of ABX  Leota Sauers Pharm.D. CPP, BCPS Clinical Pharmacist [phone] 01/04/2016 12:48 PM

## 2016-01-04 NOTE — Progress Notes (Signed)
After much coaxing, finally encouraged pt to ambulate in hallway.  He only walked approx 6ft and requested to return to bed.  Will continue to try to ambulate this pt TiD as ordered.

## 2016-01-04 NOTE — Progress Notes (Signed)
Inpatient Diabetes Program Recommendations  AACE/ADA: New Consensus Statement on Inpatient Glycemic Control (2015)  Target Ranges:  Prepandial:   less than 140 mg/dL      Peak postprandial:   less than 180 mg/dL (1-2 hours)      Critically ill patients:  140 - 180 mg/dL   Review of Glycemic Control  Inpatient Diabetes Program Recommendations:  Correction (SSI): change Novolog to TID + HS scale per Glycemic Control Order-set Thank you  Piedad Climes BSN, RN,CDE Inpatient Diabetes Coordinator [phone] (team pager)

## 2016-01-05 DIAGNOSIS — J189 Pneumonia, unspecified organism: Secondary | ICD-10-CM | POA: Insufficient documentation

## 2016-01-05 DIAGNOSIS — Z9989 Dependence on other enabling machines and devices: Secondary | ICD-10-CM

## 2016-01-05 DIAGNOSIS — G4733 Obstructive sleep apnea (adult) (pediatric): Secondary | ICD-10-CM | POA: Insufficient documentation

## 2016-01-05 LAB — BASIC METABOLIC PANEL
Anion gap: 10 (ref 5–15)
BUN: 22 mg/dL — ABNORMAL HIGH (ref 6–20)
CALCIUM: 8.8 mg/dL — AB (ref 8.9–10.3)
CHLORIDE: 95 mmol/L — AB (ref 101–111)
CO2: 35 mmol/L — AB (ref 22–32)
CREATININE: 1.45 mg/dL — AB (ref 0.61–1.24)
GFR calc non Af Amer: 49 mL/min — ABNORMAL LOW (ref >60)
GFR, EST AFRICAN AMERICAN: 57 mL/min — AB (ref >60)
GLUCOSE: 192 mg/dL — AB (ref 65–99)
Potassium: 4.1 mmol/L (ref 3.5–5.1)
Sodium: 140 mmol/L (ref 135–145)

## 2016-01-05 LAB — CBC
HEMATOCRIT: 41.5 % (ref 39.0–52.0)
HEMOGLOBIN: 12.5 g/dL — AB (ref 13.0–17.0)
MCH: 25.5 pg — AB (ref 26.0–34.0)
MCHC: 30.1 g/dL (ref 30.0–36.0)
MCV: 84.7 fL (ref 78.0–100.0)
Platelets: 247 10*3/uL (ref 150–400)
RBC: 4.9 MIL/uL (ref 4.22–5.81)
RDW: 15.7 % — AB (ref 11.5–15.5)
WBC: 9.4 10*3/uL (ref 4.0–10.5)

## 2016-01-05 LAB — BLOOD GAS, ARTERIAL
Acid-Base Excess: 13 mmol/L — ABNORMAL HIGH (ref 0.0–2.0)
BICARBONATE: 38.9 meq/L — AB (ref 20.0–24.0)
Drawn by: 249101
O2 Content: 3 L/min
O2 SAT: 94.1 %
PH ART: 7.364 (ref 7.350–7.450)
PO2 ART: 73.7 mmHg — AB (ref 80.0–100.0)
Patient temperature: 98.6
TCO2: 41 mmol/L (ref 0–100)
pCO2 arterial: 69.9 mmHg (ref 35.0–45.0)

## 2016-01-05 LAB — GLUCOSE, CAPILLARY
GLUCOSE-CAPILLARY: 305 mg/dL — AB (ref 65–99)
GLUCOSE-CAPILLARY: 420 mg/dL — AB (ref 65–99)
Glucose-Capillary: 212 mg/dL — ABNORMAL HIGH (ref 65–99)
Glucose-Capillary: 192 mg/dL — ABNORMAL HIGH (ref 65–99)
Glucose-Capillary: 133 mg/dL — ABNORMAL HIGH (ref 65–99)
Glucose-Capillary: 248 mg/dL — ABNORMAL HIGH (ref 65–99)
Glucose-Capillary: 347 mg/dL — ABNORMAL HIGH (ref 65–99)

## 2016-01-05 LAB — VANCOMYCIN, TROUGH: VANCOMYCIN TR: 46 ug/mL — AB (ref 10.0–20.0)

## 2016-01-05 MED ORDER — DOXYCYCLINE HYCLATE 100 MG PO TABS
100.0000 mg | ORAL_TABLET | Freq: Two times a day (BID) | ORAL | Status: DC
  Administered 2016-01-05 – 2016-01-09 (×8): 100 mg via ORAL
  Filled 2016-01-05 (×8): qty 1

## 2016-01-05 MED ORDER — INSULIN DETEMIR 100 UNIT/ML ~~LOC~~ SOLN
25.0000 [IU] | Freq: Every day | SUBCUTANEOUS | Status: DC
  Administered 2016-01-05: 25 [IU] via SUBCUTANEOUS
  Filled 2016-01-05 (×2): qty 0.25

## 2016-01-05 MED ORDER — INSULIN DETEMIR 100 UNIT/ML ~~LOC~~ SOLN: 25.0000 [IU] | Freq: Every day | SUBCUTANEOUS | Status: DC

## 2016-01-05 MED ORDER — INSULIN ASPART 100 UNIT/ML ~~LOC~~ SOLN
22.0000 [IU] | Freq: Once | SUBCUTANEOUS | Status: AC
  Administered 2016-01-05: 22 [IU] via SUBCUTANEOUS

## 2016-01-05 MED ORDER — INSULIN DETEMIR 100 UNIT/ML ~~LOC~~ SOLN
28.0000 [IU] | Freq: Every day | SUBCUTANEOUS | Status: DC
  Administered 2016-01-06: 28 [IU] via SUBCUTANEOUS
  Filled 2016-01-05: qty 0.28

## 2016-01-05 MED ORDER — ZOLPIDEM TARTRATE 5 MG PO TABS
5.0000 mg | ORAL_TABLET | Freq: Every evening | ORAL | Status: DC | PRN
  Administered 2016-01-06 – 2016-01-08 (×3): 5 mg via ORAL
  Filled 2016-01-05 (×4): qty 1

## 2016-01-05 MED ORDER — SALINE SPRAY 0.65 % NA SOLN
1.0000 | NASAL | Status: DC | PRN
  Filled 2016-01-05: qty 44

## 2016-01-05 MED ORDER — TORSEMIDE 20 MG PO TABS
80.0000 mg | ORAL_TABLET | Freq: Two times a day (BID) | ORAL | Status: DC
  Administered 2016-01-05 – 2016-01-09 (×8): 80 mg via ORAL
  Filled 2016-01-05 (×9): qty 4

## 2016-01-05 NOTE — Progress Notes (Signed)
Lab called critical vanc trough on pt. Vancomycin trough 46. Discussed with Sheppard Coil Pharmacist regarding vancomycin administered late at 1340 and lab came to draw trough at 1409. Pt had vancomycin infusing while lab was drawn therefore the result is not accurate. Sheppard Coil pharmacist will place new orders. Will continue to monitor pt.

## 2016-01-05 NOTE — Progress Notes (Signed)
Patient ID: Antonio [name], male   DOB: [birth year], 65 y.o.   MRN: [id number]     Advanced Heart Failure Rounding Note  Primary Physician: Dr. Leonette Most [facility]) Primary Cardiologist: Dr. Shirlee Latch   Subjective:    VQ scan low probability, suspect no PE.  To me he is more sleepy this morning. Had increased dose of Ambien last night. He also seems more SOB, having to stop and breathe every few words.  He recognizes me and what hospital he is in, but can't remember why he came in.    He came around slightly while we talked, stating he is just waking up and keeps getting woken up "every 5 minutes", but quickly falls back asleep without stimulation. Initially his speech even sounded slightly slurred.   Creatinine up slightly.  Diuresed well on home regimen. K stable.       Objective:   Weight Range: 364 lb 11.2 oz (165.427 kg) Body mass index is 50.89 kg/(m^2).   Vital Signs:   Temp:  [98.1 F (36.7 C)-98.4 F (36.9 C)] 98.1 F (36.7 C) (03/09 0455) Pulse Rate:  [96-110] 101 (03/09 0455) Resp:  [18-20] 20 (03/09 0455) BP: (128-139)/(76-93) 128/80 mmHg (03/09 0455) SpO2:  [92 %-96 %] 92 % (03/09 0455) Weight:  [364 lb 11.2 oz (165.427 kg)] 364 lb 11.2 oz (165.427 kg) (03/09 0455) Last BM Date: 01/02/16  Weight change: Filed Weights   01/03/16 0709 01/04/16 0520 01/05/16 0455  Weight: 373 lb 9.6 oz (169.464 kg) 369 lb 4.8 oz (167.513 kg) 364 lb 11.2 oz (165.427 kg)    Intake/Output:   Intake/Output Summary (Last 24 hours) at 01/05/16 0747 Last data filed at 01/05/16 0110  Gross per 24 hour  Intake   1887 ml  Output   4075 ml  Net  -2188 ml    Physical Exam General: Sleepy, almost lethargic. No resp difficulty HEENT: normal Neck: supple. JVP difficult to assess, does not appear elevated. Carotids 2+ bilat; no bruits. No thyromegaly or nodule noted. Cor: PMI nondisplaced. Distant. RRR. No M/G/R noted. Lungs: Diminished throughout Abdomen: Morbidly obese, NT, ND, no HSM. No  bruits or masses. +BS  Extremities: no cyanosis, clubbing, rash. Trace ankle edema Neuro: alert and oriented to persona and place. Cranial nerves grossly intact. Moves all 4 extremities w/o difficulty. Equal grip strength, no pronator drift.  Affect flat  Telemetry: Reviewed personally, NSR 90s  Labs: CBC  Recent Labs  01/04/16 0515 01/05/16 0414  WBC 9.2 9.4  HGB 12.9* 12.5*  HCT 42.0 41.5  MCV 85.7 84.7  PLT 236 247   Basic Metabolic Panel  Recent Labs  01/03/16 0531 01/04/16 0515 01/05/16 0414  NA 140 143 140  K 4.0 3.8 4.1  CL 101 98* 95*  CO2 32 37* 35*  GLUCOSE 197* 74 192*  BUN 19 17 22*  CREATININE 1.33* 1.30* 1.45*  CALCIUM 8.3* 8.7* 8.8*  MG 2.0  --   --   PHOS 3.5  --   --    Liver Function Tests No results for input(s): AST, ALT, ALKPHOS, BILITOT, PROT, ALBUMIN in the last 72 hours. No results for input(s): LIPASE, AMYLASE in the last 72 hours. Cardiac Enzymes  Recent Labs  01/02/16 0856  TROPONINI 0.04*    BNP: BNP (last 3 results)  Recent Labs  10/04/15 1044 01/01/16 1108 01/02/16 0652  BNP 13.5 196.0* 173.7*    ProBNP (last 3 results) No results for input(s): PROBNP in the last 8760 hours.  D-Dimer No results for input(s): DDIMER in the last 72 hours. Hemoglobin A1C No results for input(s): HGBA1C in the last 72 hours. Fasting Lipid Panel No results for input(s): CHOL, HDL, LDLCALC, TRIG, CHOLHDL, LDLDIRECT in the last 72 hours. Thyroid Function Tests No results for input(s): TSH, T4TOTAL, T3FREE, THYROIDAB in the last 72 hours.  Invalid input(s): FREET3  Other results:  Imaging/Studies:  No results found.  Latest Echo  Latest Cath   Medications:     Scheduled Medications: . antiseptic oral rinse  7 mL Mouth Rinse q12n4p  . aspirin  325 mg Oral Daily  . atorvastatin  40 mg Oral q1800  . chlorhexidine  15 mL Mouth Rinse BID  . Chlorhexidine Gluconate Cloth  6 each Topical Q0600  . colchicine  0.6 mg Oral  Daily  . febuxostat  40 mg Oral Daily  . fluticasone  1 spray Each Nare Daily  . guaiFENesin  600 mg Oral BID  . heparin subcutaneous  5,000 Units Subcutaneous 3 times per day  . insulin aspart  0-20 Units Subcutaneous 6 times per day  . insulin detemir  25 Units Subcutaneous BID  . multivitamin with minerals  1 tablet Oral Daily  . mupirocin ointment  1 application Nasal BID  . pantoprazole  40 mg Oral QHS  . piperacillin-tazobactam (ZOSYN)  IV  3.375 g Intravenous 3 times per day  . potassium chloride  40 mEq Oral Daily  . predniSONE  40 mg Oral Q breakfast  . sodium chloride  1,000 mL Intravenous Once  . torsemide  100 mg Oral QPC breakfast  . torsemide  80 mg Oral QPM  . vancomycin  1,250 mg Intravenous Q12H    Infusions:    PRN Medications: technetium TC 65M diethylenetriame-pentaacetic acid, zolpidem   Assessment/Plan   1. 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  Also getting empiric treatment for HCAP. VQ scan 01/02/16 with low probability for acute PE.  2. Chronic diastolic CHF: Echo 01/01/16 60-65% with grade 2 diastolic dysfunction, severely reduced RV function (mild/mod on Echo in 01/2015). His RV does look worse than previous echoes. VQ scan low probability for acute PE.  Possibly RV failure from OHS/OSA. Volume status ok, continue current torsemide.  - Worried that he may be retaining CO2 again with CPAP refusal as he is slightly lethargic this morning.  Will repeat ABG.  May also be related to increased dose of ambien last night.  - Will ask nurse to place him on CPAP.  3. AKI on CKD stage III: Creatinine better today.  4. S/p Bioprosthetic AV 5. Morbid obesity: Continued to lose weight would most likely help with his disease state.  - Needs ongoing PT work.  6. OHS/OSA: He needs CPAP at home, may need home oxygen.  7. Disposition: He wants to go back to SNF for rehab.  Think this would probably be a  good idea as he has been at home alone and I am concerned that he will not do well there.    Length of Stay: 4  Luane School 01/05/2016, 7:47 AM  Advanced Heart Failure Team Pager [phone] (M-F; 7a - 4p)  Please contact [facility] for night-coverage after hours (4p -7a ) and weekends on [email or web address]  Patient seen with PA, agree with the above note.    ABG: 7.36/70/74 suggesting compensated respiratory acidosis.    When I saw him this morning, he was wide awake and oriented.  He had Ambien 10 mg last night and only used his CPAP for about an hour. He needs to get no more than 5 mg Ambien for sleep and need to make an effort to use CPAP more.  This is key to avoiding CO2 narcosis for him.  We discussed this again.   In terms of CHF, I think he is euvolemic.  Would continue torsemide, can cut to 80 mg bid.   I do not think he will do well going home, I suspect he would be right back in the hospital.  He has a lot of trouble caring for himself.  I would recommend rehab stay if possible.  Have asked social worker to talk to him about this.   [name] 01/05/2016 8:58 AM

## 2016-01-05 NOTE — Progress Notes (Signed)
ANTIBIOTIC CONSULT NOTE - Consult   Pharmacy Consult for Vanc/zosyn Indication: sepsis / HCAP - from SNF  Allergies  Allergen Reactions  . Other Other (See Comments)    Salt causes "extreme" edema.    Patient Measurements: Height: 5\' 11"  (180.3 cm) Weight: (!) 364 lb 11.2 oz (165.427 kg) (scale b) IBW/kg (Calculated) : 75.3Weight: 180.1 kg (on 11/03/15) Height: 179 cm (on 10/14/15) IBW: 73 kg Heparin Dosing Weight: 118 kg  Vital Signs: Temp: 98.2 F (36.8 C) (03/09 1228) Temp Source: Oral (03/09 1228) BP: 153/89 mmHg (03/09 1228) Pulse Rate: 103 (03/09 1228)  Labs:  Recent Labs  01/03/16 0531 01/04/16 0515 01/05/16 0414  HGB 12.9* 12.9* 12.5*  HCT 41.9 42.0 41.5  PLT 235 236 247  CREATININE 1.33* 1.30* 1.45*    Estimated Creatinine Clearance: 80 mL/min (by C-G formula based on Cr of 1.45).  Assessment: 65 yo M admitted 01/01/2016 with complaint of SOB over the last 7 days. 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 Pharmacy consulted to dose vancomycin/zosyn for sepsis.  Renal function stable   Vancomycin trough this afternoon was elevated at 46, d/w nurse and apparently partial dose was given prior to lab being drawn. Will recheck true trough in am.  Goal of Therapy:   Vancomycin trough 15-20 Monitor platelets by anticoagulation protocol: Yes   Plan:  Vancomycin 1250mg  q12 Zosyn 3.375 GM q8 EI  F/u LOT of ABX  Sheppard Coil PharmD., BCPS Clinical Pharmacist Pager [phone] 01/05/2016 3:33 PM

## 2016-01-05 NOTE — Progress Notes (Signed)
[facility] PROGRESS NOTE  Antonio [name] LKH:[id number] DOB: 04-20-1951 DOA: 01/01/2016 PCP: [name], MD  Assessment/Plan: 65yo male with hx dCHF, CKD, morbid obesity, OSA non compliant with CPAP, severe AS s/p bioprosthetic aortic valve replacement 2013. Just d/c 3/1 from SNF where he was doing rehab after a hospitalization for acute on chronic CHF. He presents 3/5 with increased SOB, cough. In ER was hypotensive despite 3L fluids, mild respiratory distress and hypoxia requiring bipap. PCCM called to admit.   Per ex-wife/caretaker he was treated for bronchitis with prednisone and abx prior to d/c from SNF. He has not had fevers, chills, purulent sputum. BLE edema is at baseline. Denies hemoptysis, chest pain, abd pain, n/v/d.   Acute Hypoxic respiratory failure - likely multifactorial in setting untreated OSA/OHS, heart failure, URI v less likely HCAP.  RV dilated concerning for PE on ECHO--- was on heparin gtt whic was discontinue after V-Q scan showed low probability for PE>  CPAP while sleep  Continue with IV antibiotics to treat for Health care associated PNA>  Diuretics resume 3-07 by HF team.  BLE venous dopplers negative  Health care associated PNA;  Strep PNA negative.  WBC 13 on admission, now  normalized.  No fever Chest x ray on admission bilateral atelectasis, infiltrates.   on Vancomycin and Zosyn since admission, culture unrevealing, change abx to oral doxycycline on 3/9.  Complaining of  Nasal congestion, start Flonase. Also Guaifenesin to help with secretions.   Acute on Chronic Diastolic CHF , Severe AS s/p valve replacement 2013  On demadex, dose per heart failure team.  Patient reported lower extremity edema has much improved  SIRS/Hypotension - improved with volume. ?infectious source.  Treating for PNA>   CKD -Stage III  Baseline Scr ~1.8 Cr at 1.25, better than baseline.  Monitor on torsemide.   Diabetes:  Decrease  levemir to 25  BID, he had hypoglycemia 3-06  Encephalopathy , AMS - improved. suspect related to hypercapnia, OSA>  Hold sedating medications  Resolved.   History of Gout: on colchicine and Uloric. Complaining of left knee pain.  Start low dose prednisone/   Code Status: Full Code.  Family Communication: care discussed with patient.  Disposition Plan: SNF in 1-2 days   Consultants:  HF team.   CMM admitted patient, transfer to [facility] 3-07  Procedures:  VQ scan  Antibiotics:  Vancomycin 3-05 to 3/9  Zosyn 3-05 to 3/9  Doxycycline from 3/9-  HPI/Subjective: Lethargic this am, better later, reported overall feeling better   Objective: Filed Vitals:   01/05/16 0455 01/05/16 1228  BP: 128/80 153/89  Pulse: 101 103  Temp: 98.1 F (36.7 C) 98.2 F (36.8 C)  Resp: 20 20    Intake/Output Summary (Last 24 hours) at 01/05/16 1714 Last data filed at 01/05/16 1353  Gross per 24 hour  Intake   1550 ml  Output   3225 ml  Net  -1675 ml   Filed Weights   01/03/16 0709 01/04/16 0520 01/05/16 0455  Weight: 169.464 kg (373 lb 9.6 oz) 167.513 kg (369 lb 4.8 oz) 165.427 kg (364 lb 11.2 oz)    Exam:   General:  Obese, NAD  Cardiovascular: S 1, S 2 RRR  Respiratory: Distant breath sound, bilateral crackles.   Abdomen: Bs resent, soft, obese  Musculoskeletal: bilateral trace edema has much improved  Data Reviewed: Basic Metabolic Panel:  Recent Labs Lab 01/01/16 1204 01/02/16 0653 01/03/16 0531 01/04/16 0515 01/05/16 0414  NA 135 144 140  143 140  K 4.4 4.2 4.0 3.8 4.1  CL 96* 106 101 98* 95*  CO2 28 30 32 37* 35*  GLUCOSE 225* 75 197* 74 192*  BUN 37* 24* 19 17 22*  CREATININE 1.63* 1.25* 1.33* 1.30* 1.45*  CALCIUM 8.4* 8.4* 8.3* 8.7* 8.8*  MG  --  2.3 2.0  --   --   PHOS  --  3.5 3.5  --   --    Liver Function Tests: No results for input(s): AST, ALT, ALKPHOS, BILITOT, PROT, ALBUMIN in the last 168 hours. No results for input(s): LIPASE, AMYLASE in the last  168 hours. No results for input(s): AMMONIA in the last 168 hours. CBC:  Recent Labs Lab 01/01/16 1108 01/02/16 0856 01/03/16 0531 01/04/16 0515 01/05/16 0414  WBC 13.0* 12.0* 9.2 9.2 9.4  NEUTROABS 9.0*  --   --   --   --   HGB 15.2 13.5 12.9* 12.9* 12.5*  HCT 45.6 46.3 41.9 42.0 41.5  MCV 81.3 86.1 85.7 85.7 84.7  PLT 248 190 235 236 247   Cardiac Enzymes:  Recent Labs Lab 01/01/16 1940 01/02/16 0856  TROPONINI 0.05* 0.04*   BNP (last 3 results)  Recent Labs  10/04/15 1044 01/01/16 1108 01/02/16 0652  BNP 13.5 196.0* 173.7*    ProBNP (last 3 results) No results for input(s): PROBNP in the last 8760 hours.  CBG:  Recent Labs Lab 01/05/16 0100 01/05/16 0421 01/05/16 0759 01/05/16 1156 01/05/16 1632  GLUCAP 212* 192* 133* 248* 347*    Recent Results (from the past 240 hour(s))  Culture, blood (routine x 2)     Status: None (Preliminary result)   Collection Time: 01/01/16 11:42 AM  Result Value Ref Range Status   Specimen Description BLOOD RIGHT HAND  Final   Special Requests BOTTLES DRAWN AEROBIC AND ANAEROBIC 5CC  Final   Culture NO GROWTH 4 DAYS  Final   Report Status PENDING  Incomplete  Culture, blood (routine x 2)     Status: None (Preliminary result)   Collection Time: 01/01/16 12:08 PM  Result Value Ref Range Status   Specimen Description BLOOD LEFT HAND  Final   Special Requests BOTTLES DRAWN AEROBIC AND ANAEROBIC 5CC  Final   Culture NO GROWTH 4 DAYS  Final   Report Status PENDING  Incomplete  Urine culture     Status: None   Collection Time: 01/01/16  2:23 PM  Result Value Ref Range Status   Specimen Description URINE, RANDOM  Final   Special Requests NONE  Final   Culture NO GROWTH 1 DAY  Final   Report Status 01/02/2016 FINAL  Final  MRSA PCR Screening     Status: Abnormal   Collection Time: 01/02/16  4:59 AM  Result Value Ref Range Status   MRSA by PCR POSITIVE (A) NEGATIVE Final    Comment:        The GeneXpert MRSA Assay  (FDA approved for NASAL specimens only), is one component of a comprehensive MRSA colonization surveillance program. It is not intended to diagnose MRSA infection nor to guide or monitor treatment for MRSA infections. RESULT CALLED TO, READ BACK BY AND VERIFIED WITH: HAYES,C RN [phone] 01/02/16 MITCHELL,L      Studies: No results found.  Scheduled Meds: . antiseptic oral rinse  7 mL Mouth Rinse q12n4p  . aspirin  325 mg Oral Daily  . atorvastatin  40 mg Oral q1800  . chlorhexidine  15 mL Mouth Rinse BID  . Chlorhexidine  Gluconate Cloth  6 each Topical [id number]  . colchicine  0.6 mg Oral Daily  . febuxostat  40 mg Oral Daily  . fluticasone  1 spray Each Nare Daily  . guaiFENesin  600 mg Oral BID  . heparin subcutaneous  5,000 Units Subcutaneous 3 times per day  . insulin aspart  0-20 Units Subcutaneous 6 times per day  . insulin detemir  25 Units Subcutaneous BID  . multivitamin with minerals  1 tablet Oral Daily  . mupirocin ointment  1 application Nasal BID  . pantoprazole  40 mg Oral QHS  . piperacillin-tazobactam (ZOSYN)  IV  3.375 g Intravenous 3 times per day  . potassium chloride  40 mEq Oral Daily  . predniSONE  40 mg Oral Q breakfast  . sodium chloride  1,000 mL Intravenous Once  . torsemide  80 mg Oral BID  . vancomycin  1,250 mg Intravenous Q12H   Continuous Infusions:    Active Problems:   Respiratory failure (HCC)   PNA (pneumonia)    Time spent: 35 minutes    Irving Bloor MD PhD  [facility] Pager [phone]. If 7PM-7AM, please contact night-coverage at [email or web address], password [facility] 01/05/2016, 5:14 PM  LOS: 4 days

## 2016-01-05 NOTE — Progress Notes (Signed)
Inpatient Diabetes Program Recommendations  AACE/ADA: New Consensus Statement on Inpatient Glycemic Control (2015)  Target Ranges:  Prepandial:   less than 140 mg/dL      Peak postprandial:   less than 180 mg/dL (1-2 hours)      Critically ill patients:  140 - 180 mg/dL   Results for Antonio [name], Antonio [name] (MRN [id number]) as of 01/05/2016 09:05  Ref. Range 01/04/2016 00:01 01/04/2016 04:27 01/04/2016 06:24 01/04/2016 07:48 01/04/2016 11:52 01/04/2016 17:02 01/04/2016 20:47  Glucose-Capillary Latest Ref Range: 65-99 mg/dL 762 (H) 69 831 (H) 517 (H) 175 (H) 226 (H) 327 (H)   Results for Antonio [name], Antonio [name] (MRN [id number]) as of 01/05/2016 09:05  Ref. Range 01/05/2016 01:00 01/05/2016 04:21 01/05/2016 07:59  Glucose-Capillary Latest Ref Range: 65-99 mg/dL 626 (H) 948 (H) 546 (H)    Home DM Meds: Levemir 60 units bid       Novolog 30-40 units tidwc  Current Insulin Orders: Levemir 25 units bid      Novolog Resistant Correction Scale/ SSI (0-20 units) Q4 hours     -Patient eating 75-100% of meals per documentation.   -Also getting Prednisone 40 mg daily.     MD- Please consider the following in-hospital insulin adjustments:  1. Change/Decrease Novolog Correction Scale/ SSI to Moderate scale (0-15 units) TID AC + HS  2. Start Novolog Meal Coverage- Novolog 6 units tidwc   3. Leave Levemir at current dose for now (25 units bid) as fasting glucose levels look good today      --Will follow patient during hospitalization--  Ambrose Finland RN, MSN, CDE Diabetes Coordinator Inpatient Glycemic Control Team Team Pager: [phone] (8a-5p)

## 2016-01-05 NOTE — Progress Notes (Signed)
PT Cancellation Note  Patient Details Name: Antonio [name] MRN: [id number] DOB: [birth year]   Cancelled Treatment:    Reason Eval/Treat Not Completed: Medical issues which prohibited therapy. Pt has been on Cpap today to decrease CO2, very lethargic earlier and now sleepy.  Will try again tomorrow.   Ivar Drape 01/05/2016, 3:14 PM   Samul Dada, PT MS Acute Rehab Dept. Number: [facility] [id number] and [facility] [phone]

## 2016-01-06 ENCOUNTER — Other Ambulatory Visit (HOSPITAL_COMMUNITY): Payer: Self-pay | Admitting: Cardiology

## 2016-01-06 DIAGNOSIS — IMO0001 Reserved for inherently not codable concepts without codable children: Secondary | ICD-10-CM | POA: Insufficient documentation

## 2016-01-06 DIAGNOSIS — E119 Type 2 diabetes mellitus without complications: Secondary | ICD-10-CM

## 2016-01-06 DIAGNOSIS — Z794 Long term (current) use of insulin: Secondary | ICD-10-CM

## 2016-01-06 LAB — BASIC METABOLIC PANEL
Anion gap: 14 (ref 5–15)
BUN: 29 mg/dL — AB (ref 6–20)
CHLORIDE: 90 mmol/L — AB (ref 101–111)
CO2: 36 mmol/L — AB (ref 22–32)
CREATININE: 1.47 mg/dL — AB (ref 0.61–1.24)
Calcium: 9.2 mg/dL (ref 8.9–10.3)
GFR calc Af Amer: 56 mL/min — ABNORMAL LOW (ref >60)
GFR calc non Af Amer: 48 mL/min — ABNORMAL LOW (ref >60)
GLUCOSE: 295 mg/dL — AB (ref 65–99)
Potassium: 4 mmol/L (ref 3.5–5.1)
Sodium: 140 mmol/L (ref 135–145)

## 2016-01-06 LAB — CULTURE, BLOOD (ROUTINE X 2)
CULTURE: NO GROWTH
Culture: NO GROWTH

## 2016-01-06 LAB — GLUCOSE, CAPILLARY
GLUCOSE-CAPILLARY: 163 mg/dL — AB (ref 65–99)
GLUCOSE-CAPILLARY: 387 mg/dL — AB (ref 65–99)
GLUCOSE-CAPILLARY: 371 mg/dL — AB (ref 65–99)
Glucose-Capillary: 257 mg/dL — ABNORMAL HIGH (ref 65–99)
Glucose-Capillary: 233 mg/dL — ABNORMAL HIGH (ref 65–99)
Glucose-Capillary: 253 mg/dL — ABNORMAL HIGH (ref 65–99)

## 2016-01-06 LAB — URIC ACID: Uric Acid, Serum: 5.5 mg/dL (ref 4.4–7.6)

## 2016-01-06 LAB — CBC
HEMATOCRIT: 43 % (ref 39.0–52.0)
HEMOGLOBIN: 13.1 g/dL (ref 13.0–17.0)
MCH: 25.8 pg — AB (ref 26.0–34.0)
MCHC: 30.5 g/dL (ref 30.0–36.0)
MCV: 84.8 fL (ref 78.0–100.0)
Platelets: 306 10*3/uL (ref 150–400)
RBC: 5.07 MIL/uL (ref 4.22–5.81)
RDW: 15.6 % — ABNORMAL HIGH (ref 11.5–15.5)
WBC: 11.5 10*3/uL — ABNORMAL HIGH (ref 4.0–10.5)

## 2016-01-06 MED ORDER — INSULIN DETEMIR 100 UNIT/ML ~~LOC~~ SOLN: 35.0000 [IU] | Freq: Every day | SUBCUTANEOUS | Status: DC

## 2016-01-06 MED ORDER — PREDNISONE 20 MG PO TABS
30.0000 mg | ORAL_TABLET | Freq: Every day | ORAL | Status: DC
  Administered 2016-01-07 – 2016-01-08 (×2): 30 mg via ORAL
  Filled 2016-01-06 (×3): qty 1

## 2016-01-06 MED ORDER — INSULIN ASPART 100 UNIT/ML ~~LOC~~ SOLN: 0.0000 [IU] | Freq: Three times a day (TID) | SUBCUTANEOUS | Status: DC

## 2016-01-06 MED ORDER — INSULIN DETEMIR 100 UNIT/ML ~~LOC~~ SOLN
30.0000 [IU] | Freq: Every day | SUBCUTANEOUS | Status: DC
  Administered 2016-01-06 – 2016-01-07 (×2): 30 [IU] via SUBCUTANEOUS
  Filled 2016-01-06 (×3): qty 0.3

## 2016-01-06 MED ORDER — INSULIN ASPART 100 UNIT/ML ~~LOC~~ SOLN
0.0000 [IU] | Freq: Three times a day (TID) | SUBCUTANEOUS | Status: DC
  Administered 2016-01-06: 11 [IU] via SUBCUTANEOUS
  Administered 2016-01-06 (×2): 20 [IU] via SUBCUTANEOUS
  Administered 2016-01-07: 15 [IU] via SUBCUTANEOUS
  Administered 2016-01-07: 7 [IU] via SUBCUTANEOUS
  Administered 2016-01-07: 20 [IU] via SUBCUTANEOUS
  Administered 2016-01-07: 15 [IU] via SUBCUTANEOUS
  Administered 2016-01-08: 11 [IU] via SUBCUTANEOUS
  Administered 2016-01-08: 20 [IU] via SUBCUTANEOUS
  Administered 2016-01-08 – 2016-01-09 (×2): 11 [IU] via SUBCUTANEOUS
  Administered 2016-01-09: 4 [IU] via SUBCUTANEOUS

## 2016-01-06 NOTE — Progress Notes (Signed)
Physical Therapy Treatment Patient Details Name: Antonio [name] MRN: [id number] DOB: Jul 28, 1951 Today's Date: 01/06/2016    History of Present Illness 65 yo male with respiratory distress and untreated sleep apnea, CHF, chronic LE edema and recent discharge from full 100 days in inpt rehab.  PMHx:  CHF, hypoventilation syndrome, hypotension,     PT Comments    Pt admitted with above diagnosis. Pt currently with functional limitations due to balance and endurance deficits. Pt was able to ambulate with min guard assist to min assist with RW due to safety awareness issues and endurance issues.  Will benefit from SNF and pt is willing to go to [facility] or somewhere in that vicinity.  Does not want to go to [facility].  Will follow acutely.    Pt will benefit from skilled PT to increase their independence and safety with mobility to allow discharge to the venue listed below.    Follow Up Recommendations  SNF;Supervision/Assistance - 24 hour     Equipment Recommendations  None recommended by PT    Recommendations for Other Services       Precautions / Restrictions Precautions Precautions: Fall Restrictions Weight Bearing Restrictions: No    Mobility  Bed Mobility Overal bed mobility: Modified Independent                Transfers Overall transfer level: Needs assistance Equipment used: Rolling walker (2 wheeled) Transfers: Sit to/from Stand           General transfer comment: cued hand placement but pt can stand up from higher surfaces  Ambulation/Gait Ambulation/Gait assistance: Min guard;Min assist Ambulation Distance (Feet): 20 Feet Assistive device: Rolling walker (2 wheeled) Gait Pattern/deviations: Step-to pattern;Wide base of support;Trunk flexed;Drifts right/left   Gait velocity interpretation: Below normal speed for age/gender General Gait Details: Pt needed cues to stay close to RW.  Pt with wide BOS.  Pt with flexed posture but does not respond to  cues to stand tall.  Generally steady but has decr safety due to does not sequence steps and RW at all times.  Pt refused to wear shoes or grippy socks and said he does better barefooted so allowed since in room only.  Pt needed 4LO2 to keep sats >90% with ambulation and was then only 91% with pt DOE4/4 at end of walk.     Stairs            Wheelchair Mobility    Modified Rankin (Stroke Patients Only)       Balance Overall balance assessment: Needs assistance Sitting-balance support: No upper extremity supported;Feet supported Sitting balance-Leahy Scale: Fair     Standing balance support: Bilateral upper extremity supported;During functional activity Standing balance-Leahy Scale: Poor Standing balance comment: Pt was able to stand with RW with UE support for up to a minute with min guard assist only.                      Cognition Arousal/Alertness: Awake/alert Behavior During Therapy: WFL for tasks assessed/performed Overall Cognitive Status: Within Functional Limits for tasks assessed                      Exercises      General Comments        Pertinent Vitals/Pain Pain Assessment: No/denies pain  O2 on 3LO2 at 91%.  84% on RA.  Had to place O2 to 4LO2 to keep sats >91% with ambulation. DOE 4/4 by end of walk.  Left pt on 3LO2 on departure with O2 at 93%.      Home Living                      Prior Function            PT Goals (current goals can now be found in the care plan section) Progress towards PT goals: Progressing toward goals    Frequency  Min 3X/week    PT Plan Discharge plan needs to be updated    Co-evaluation             End of Session Equipment Utilized During Treatment: Gait belt;Oxygen Activity Tolerance: Patient limited by fatigue Patient left: in chair;with call bell/phone within reach;with chair alarm set     Time: 1610-9604 PT Time Calculation (min) (ACUTE ONLY): 23 min  Charges:  $Gait  Training: 8-22 mins $Therapeutic Activity: 8-22 mins                    G Codes:      Kaliq Lege F 01/12/2016, 1:35 PM [facility] Acute Rehabilitation [phone] (pager)

## 2016-01-06 NOTE — NC FL2 (Signed)
[place] MEDICAID FL2 LEVEL OF CARE SCREENING TOOL     IDENTIFICATION  Patient Name: Antonio [name] Birthdate: 1951/06/15 Sex: male Admission Date (Current Location): 01/01/2016  [facility] and [place] Number:  Producer, television/film/video and Address:  [facility], [address] [place], [place] [address]      Provider Number: [id number]  Attending Physician Name and Address:  [name], MD  Relative Name and Phone Number:       Current Level of Care: Hospital Recommended Level of Care: Skilled Nursing Facility Prior Approval Number:    Date Approved/Denied: 12/03/11 PASRR Number: [id number]  Discharge Plan: SNF    Current Diagnoses: Patient Active Problem List   Diagnosis Date Noted  . HCAP (healthcare-associated pneumonia)   . OSA on CPAP   . Morbid obesity (HCC)   . PNA (pneumonia) 01/03/2016  . Respiratory failure (HCC) 01/01/2016  . Acute respiratory failure with hypoxia (HCC)   . Shortness of breath   . Wound of right leg 11/03/2015  . Acute on chronic diastolic heart failure (HCC) 09/15/2015  . Chronic diastolic CHF (congestive heart failure) (HCC) 09/08/2015  . S/P aortic valve replacement with bioprosthetic valve 02/22/2015  . Acute on chronic diastolic CHF (congestive heart failure), NYHA class 3 (HCC) 02/18/2015  . Hypotension 01/02/2014  . PAF (paroxysmal atrial fibrillation) (HCC) 12/08/2011  . Sleep apnea 04/30/2011  . ABNORMAL CV (STRESS) TEST 11/15/2010  . Aortic stenosis 09/26/2010  . Hyperlipidemia 12/26/2006  . Gout 12/26/2006  . OBESITY, NOS 12/26/2006  . HYPERTENSION, BENIGN SYSTEMIC 12/26/2006  . EDEMA-LEGS,DUE TO VENOUS OBSTRUCT. 12/26/2006  . OSTEOARTHRITIS OF SPINE, NOS 12/26/2006    Orientation RESPIRATION BLADDER Height & Weight     Self, Time, Situation, Place  O2 (3 Liters) Continent Weight: (!) 363 lb 6.4 oz (164.837 kg) (scale b) Height:  5\' 11"  (180.3 cm)  BEHAVIORAL SYMPTOMS/MOOD NEUROLOGICAL BOWEL NUTRITION  STATUS      Continent Diet (Heart Healthy- carb modified )  AMBULATORY STATUS COMMUNICATION OF NEEDS Skin   Extensive Assist Verbally Normal                       Personal Care Assistance Level of Assistance  Bathing, Feeding, Dressing Bathing Assistance: Limited assistance Feeding assistance: Independent Dressing Assistance: Limited assistance     Functional Limitations Info  Sight, Hearing, Speech Sight Info: Adequate Hearing Info: Adequate Speech Info: Adequate    SPECIAL CARE FACTORS FREQUENCY  PT (By licensed PT)                    Contractures      Additional Factors Info  Code Status, Allergies Code Status Info: FULL Allergies Info: others           Current Medications (01/06/2016):  This is the current hospital active medication list Current Facility-Administered Medications  Medication Dose Route Frequency Provider Last Rate Last Dose  . antiseptic oral rinse (CPC / CETYLPYRIDINIUM CHLORIDE 0.05%) solution 7 mL  7 mL Mouth Rinse q12n4p Jose Angelo Dorcas Mcmurray, MD   7 mL at 01/04/16 1726  . aspirin tablet 325 mg  325 mg Oral Daily Bernadene Person, NP   325 mg at 01/06/16 0929  . atorvastatin (LIPITOR) tablet 40 mg  40 mg Oral q1800 Bernadene Person, NP   40 mg at 01/05/16 1643  . chlorhexidine (PERIDEX) 0.12 % solution 15 mL  15 mL Mouth Rinse BID Delphina Cahill A  Christene Slates, MD   15 mL at 01/04/16 0924  . colchicine tablet 0.6 mg  0.6 mg Oral Daily Bernadene Person, NP   0.6 mg at 01/06/16 0929  . doxycycline (VIBRA-TABS) tablet 100 mg  100 mg Oral Q12H [name], MD   100 mg at 01/06/16 0929  . febuxostat (ULORIC) tablet 40 mg  40 mg Oral Daily Belkys A Regalado, MD   40 mg at 01/06/16 0929  . fluticasone (FLONASE) 50 MCG/ACT nasal spray 1 spray  1 spray Each Nare Daily Belkys A Regalado, MD   1 spray at 01/06/16 0616  . guaiFENesin (MUCINEX) 12 hr tablet 600 mg  600 mg Oral BID Belkys A Regalado, MD   600 mg at 01/06/16 0929  . heparin injection  5,000 Units  5,000 Units Subcutaneous 3 times per day Roslynn Amble, MD   5,000 Units at 01/06/16 0556  . insulin aspart (novoLOG) injection 0-20 Units  0-20 Units Subcutaneous TID WC & HS [name], MD   11 Units at 01/06/16 1151  . insulin detemir (LEVEMIR) injection 25 Units  25 Units Subcutaneous QHS Leda Gauze, NP   25 Units at 01/05/16 2350  . insulin detemir (LEVEMIR) injection 28 Units  28 Units Subcutaneous Daily Leda Gauze, NP   28 Units at 01/06/16 0929  . multivitamin with minerals tablet 1 tablet  1 tablet Oral Daily Jose Alexis Frock, MD   1 tablet at 01/06/16 [phone]  . mupirocin ointment (BACTROBAN) 2 % 1 application  1 application Nasal BID Jose Alexis Frock, MD   1 application at 01/06/16 [phone]  . pantoprazole (PROTONIX) EC tablet 40 mg  40 mg Oral QHS Jose Alexis Frock, MD   40 mg at 01/05/16 2009  . potassium chloride SA (K-DUR,KLOR-CON) CR tablet 40 mEq  40 mEq Oral Daily [name], MD   40 mEq at 01/06/16 0929  . predniSONE (DELTASONE) tablet 40 mg  40 mg Oral Q breakfast Belkys A Regalado, MD   40 mg at 01/06/16 0559  . sodium chloride (OCEAN) 0.65 % nasal spray 1 spray  1 spray Each Nare PRN [name], MD      . sodium chloride 0.9 % bolus 1,000 mL  1,000 mL Intravenous Once Tilden Fossa, MD   1,000 mL at 01/01/16 1241  . torsemide (DEMADEX) tablet 80 mg  80 mg Oral BID [name], MD   80 mg at 01/06/16 5409  . zolpidem (AMBIEN) tablet 5 mg  5 mg Oral QHS PRN [name], MD         Discharge Medications: Please see discharge summary for a list of discharge medications.  Relevant Imaging Results:  Relevant Lab Results:   Additional Information SS#: [id number]  Loleta Dicker, LCSW

## 2016-01-06 NOTE — Progress Notes (Signed)
Patient ID: Antonio [name], male   DOB: November 18, 1950, 65 y.o.   MRN: [id number]     Advanced Heart Failure Rounding Note  Primary Physician: Dr. Leonette Most [facility]) Primary Cardiologist: Dr. Shirlee Latch   Subjective:    VQ scan low probability, suspect no PE.  Improved quickly yesterday am. ABG with CO2 69.9. Was placed on CPAP. Also decreased evening dose of ambien. Says he feels much better this morning. Wore CPAP overnight and on and off during the day yesterday.   Creatinine stable. Diuretics decreased to torsemide 80 mg BID.     Weight down a lb. Out ~2L yesterday.  ABG: 01/05/16 =  7.36/70/74 suggesting compensated respiratory acidosis.    Objective:   Weight Range: 363 lb 6.4 oz (164.837 kg) Body mass index is 50.71 kg/(m^2).   Vital Signs:   Temp:  [98.1 F (36.7 C)-98.2 F (36.8 C)] 98.1 F (36.7 C) (03/10 0611) Pulse Rate:  [91-103] 91 (03/10 0611) Resp:  [20] 20 (03/10 0611) BP: (117-153)/(72-89) 117/72 mmHg (03/10 0611) SpO2:  [90 %-93 %] 93 % (03/10 0611) Weight:  [363 lb 6.4 oz (164.837 kg)] 363 lb 6.4 oz (164.837 kg) (03/10 0335) Last BM Date: 01/03/16  Weight change: Filed Weights   01/04/16 0520 01/05/16 0455 01/06/16 0335  Weight: 369 lb 4.8 oz (167.513 kg) 364 lb 11.2 oz (165.427 kg) 363 lb 6.4 oz (164.837 kg)    Intake/Output:   Intake/Output Summary (Last 24 hours) at 01/06/16 5784 Last data filed at 01/06/16 0600  Gross per 24 hour  Intake   1210 ml  Output   3101 ml  Net  -1891 ml    Physical Exam General: Obese, Well appearing HEENT: normal Neck: supple. JVP difficult to assess, does not appear elevated. Carotids 2+ bilat; no bruits. No thyromegaly or nodule noted. Cor: PMI nondisplaced. Distant. RRR. No M/G/R noted. Lungs: Slightly diminished Abdomen: Morbidly obese, NT, ND, no HSM. No bruits or masses. +BS  Extremities: no cyanosis, clubbing, rash. Trace ankle edema Neuro: alert and oriented x 3. Cranial nerves grossly intact. Moves all 4  extremities w/o difficulty. Affect pleasant  Telemetry: Reviewed personally, NSR 90s  Labs: CBC  Recent Labs  01/05/16 0414 01/06/16 0420  WBC 9.4 11.5*  HGB 12.5* 13.1  HCT 41.5 43.0  MCV 84.7 84.8  PLT 247 306   Basic Metabolic Panel  Recent Labs  01/05/16 0414 01/06/16 0420  NA 140 140  K 4.1 4.0  CL 95* 90*  CO2 35* 36*  GLUCOSE 192* 295*  BUN 22* 29*  CREATININE 1.45* 1.47*  CALCIUM 8.8* 9.2   Liver Function Tests No results for input(s): AST, ALT, ALKPHOS, BILITOT, PROT, ALBUMIN in the last 72 hours. No results for input(s): LIPASE, AMYLASE in the last 72 hours. Cardiac Enzymes No results for input(s): CKTOTAL, CKMB, CKMBINDEX, TROPONINI in the last 72 hours.  BNP: BNP (last 3 results)  Recent Labs  10/04/15 1044 01/01/16 1108 01/02/16 0652  BNP 13.5 196.0* 173.7*    ProBNP (last 3 results) No results for input(s): PROBNP in the last 8760 hours.   D-Dimer No results for input(s): DDIMER in the last 72 hours. Hemoglobin A1C No results for input(s): HGBA1C in the last 72 hours. Fasting Lipid Panel No results for input(s): CHOL, HDL, LDLCALC, TRIG, CHOLHDL, LDLDIRECT in the last 72 hours. Thyroid Function Tests No results for input(s): TSH, T4TOTAL, T3FREE, THYROIDAB in the last 72 hours.  Invalid input(s): FREET3  Other results:  Imaging/Studies:  No  results found.  Latest Echo  Latest Cath   Medications:     Scheduled Medications: . antiseptic oral rinse  7 mL Mouth Rinse q12n4p  . aspirin  325 mg Oral Daily  . atorvastatin  40 mg Oral q1800  . chlorhexidine  15 mL Mouth Rinse BID  . colchicine  0.6 mg Oral Daily  . doxycycline  100 mg Oral Q12H  . febuxostat  40 mg Oral Daily  . fluticasone  1 spray Each Nare Daily  . guaiFENesin  600 mg Oral BID  . heparin subcutaneous  5,000 Units Subcutaneous 3 times per day  . insulin aspart  0-20 Units Subcutaneous 6 times per day  . insulin detemir  25 Units Subcutaneous QHS  .  insulin detemir  28 Units Subcutaneous Daily  . multivitamin with minerals  1 tablet Oral Daily  . mupirocin ointment  1 application Nasal BID  . pantoprazole  40 mg Oral QHS  . potassium chloride  40 mEq Oral Daily  . predniSONE  40 mg Oral Q breakfast  . sodium chloride  1,000 mL Intravenous Once  . torsemide  80 mg Oral BID    Infusions:    PRN Medications: sodium chloride, technetium TC 35M diethylenetriame-pentaacetic acid, zolpidem   Assessment/Plan   1. 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  Also getting empiric treatment for HCAP. VQ scan 01/02/16 with low probability for acute PE.  - Continue to wear CPAP nightly and prn.  2. Chronic diastolic CHF: Echo 01/01/16 60-65% with grade 2 diastolic dysfunction, severely reduced RV function (mild/mod on Echo in 01/2015). His RV does look worse than previous echoes. VQ scan low probability for acute PE.  Possibly RV failure from OHS/OSA. Volume status ok, continue current torsemide.  - Volume status stable. Continue po torsemide. 3. AKI on CKD stage III: Creatinine stable.   4. S/p Bioprosthetic AV 5. Morbid obesity: Continued to lose weight would most likely help with his disease state.  - Needs ongoing PT work.  6. OHS/OSA: He needs CPAP at home, may need home oxygen.  7. Disposition: He wants to go back to SNF for rehab.  HHPT recommended by PT.  He takes poor care of himself at home, but thus far is capable of helping himself.  Needs to work more with PT. Has declined past several days.   Length of Stay: 5  Luane School 01/06/2016, 7:22 AM  Advanced Heart Failure Team Pager [phone] (M-F; 7a - 4p)  Please contact [facility] for night-coverage after hours (4p -7a ) and weekends on [email or web address]  Patient seen with PA, agree with the above note.  He is stable today.  No changes.  Needs to continue CPAP at night and oxygen during the day.  I  think he is stable for discharge to SNF.  I think he will manage poorly at home alone. Still waiting for social work guidance in terms of SNF.   Antonio [name]  01/06/2016

## 2016-01-06 NOTE — Clinical Social Work Note (Signed)
Clinical Social Work Assessment  Patient Details  Name: Antonio [name] MRN: [id number] Date of Birth: 20-May-1951  Date of referral:  01/06/16               Reason for consult:  Facility Placement                Permission sought to share information with:  Case Manager, Family Supports Permission granted to share information::  No  Name::        Agency::     Relationship::     Contact Information:     Housing/Transportation Living arrangements for the past 2 months:  Single Family Home (Patient was a resident at [facility] SNF. Was discharged home from the facility where he stayed at few days. Then was admitted into the hospital.  ) Source of Information:  Patient Patient Interpreter Needed:  None Criminal Activity/Legal Involvement Pertinent to Current Situation/Hospitalization:  No - Comment as needed Significant Relationships:  None Lives with:  Self Do you feel safe going back to the place where you live?  Yes Need for family participation in patient care:  Yes (Comment)  Care giving concerns:  None reported at this time.    Social Worker assessment / plan:  CSW received consult by MD. CSW went to speak with patient regarding the possible need for short term rehab. CSW introduced self and acknowledged the patient. Patient is alert and orientedx4. Patient was calm and cooperative with CSW assessment. CSW informed patient of PT recommendation for short term rehab. Patient is agreeable to SNF placement. Patient reports he was discharged from [facility] SNF after spending a 100 days there. Patient reports he was home for four days and on the fifth day was admitted to the hospital. Patient informed CSW that he would like to go to another facility, if option is available. Patient provided CSW with permission to fax clinical information out to the facilities in [facility]. Patient's preference is (1)[facility] and (2)[facility]. CSW to complete FL2 for MD signature. CSW to initiate  SNF placement process.    Employment status:  Disabled (Comment on whether or not currently receiving Disability) Insurance information:  Medicare PT Recommendations:  Skilled Nursing Facility Information / Referral to community resources:     Patient/Family's Response to care:  Patient is agreeable to SNF placement.   Patient/Family's Understanding of and Emotional Response to Diagnosis, Current Treatment, and Prognosis:  Patient reports he is aware and understanding of current treatment and prognosis. Patient is hopeful for better progress and to return back home.   Emotional Assessment Appearance:  Appears stated age Attitude/Demeanor/Rapport:   (Calm and Cooperative ) Affect (typically observed):  Accepting, Appropriate, Calm Orientation:  Oriented to Self, Oriented to Place, Oriented to  Time, Oriented to Situation Alcohol / Substance use:  Not Applicable Psych involvement (Current and /or in the community):  No (Comment)  Discharge Needs  Concerns to be addressed:  Discharge Planning Concerns Readmission within the last 30 days:  No Current discharge risk:  Physical Impairment Barriers to Discharge:  Continued Medical Work up   [employer], LCSW 01/06/2016, 4:20 PM

## 2016-01-06 NOTE — Progress Notes (Signed)
[facility] PROGRESS NOTE  Antonio [name] XBJ:[id number] DOB: 1951/10/17 DOA: 01/01/2016 PCP: [name], MD  Assessment/Plan: 65yo male with hx dCHF, CKD, morbid obesity, OSA non compliant with CPAP, severe AS s/p bioprosthetic aortic valve replacement 2013. Just d/c 3/1 from SNF where he was doing rehab after a hospitalization for acute on chronic CHF. He presents 3/5 with increased SOB, cough. In ER was hypotensive despite 3L fluids, mild respiratory distress and hypoxia requiring bipap. PCCM called to admit.   Per ex-wife/caretaker he was treated for bronchitis with prednisone and abx prior to d/c from SNF. He has not had fevers, chills, purulent sputum. BLE edema is at baseline. Denies hemoptysis, chest pain, abd pain, n/v/d.   Acute Hypoxic respiratory failure - likely multifactorial in setting untreated OSA/OHS, heart failure, URI v less likely HCAP.  RV dilated concerning for PE on ECHO--- was on heparin gtt whic was discontinue after V-Q scan showed low probability for PE>  CPAP while sleep  Continue with IV antibiotics to treat for Health care associated PNA>  Diuretics resume 3-07 by HF team.  BLE venous dopplers negative  Health care associated PNA;  Strep PNA negative.  WBC 13 on admission, now  normalized.  No fever Chest x ray on admission bilateral atelectasis, infiltrates.   on Vancomycin and Zosyn since admission, culture unrevealing, change abx to oral doxycycline on 3/9.  Complaining of  Nasal congestion, start Flonase. Also Guaifenesin to help with secretions.   Acute on Chronic Diastolic CHF , Severe AS s/p valve replacement 2013  On demadex, dose per heart failure team.  Patient reported lower extremity edema has much improved  SIRS/Hypotension - improved with volume. ?infectious source.  Treating for PNA>   CKD -Stage III  Baseline Scr ~1.8 Cr at 1.25, better than baseline.  Monitor on torsemide.   Insulin dependent Diabetes:   he had  hypoglycemia 3-06, but blood sugar started to be elevated after start on prednisone for gout , increase insulin dose  Encephalopathy , AMS - improved. suspect related to hypercapnia, OSA>  Hold sedating medications  Resolved.   History of Gout: on colchicine and Uloric. Complaining of left knee pain.  Reported has gout flare up when getting diuresis  Start low dose prednisone, left knee pain improved.  Code Status: Full Code.  Family Communication: care discussed with patient.  Disposition Plan: SNF in 1-2 days   Consultants:  HF team.   CMM admitted patient, transfer to [facility] 3-07  Procedures:  VQ scan  Antibiotics:  Vancomycin 3-05 to 3/9  Zosyn 3-05 to 3/9  Doxycycline from 3/9-  HPI/Subjective: No Lethargy this am, better later, reported overall feeling better , wanting to eat double portion, report left knee pain has improved, blood sugar elevated  Objective: Filed Vitals:   01/05/16 2259 01/06/16 0611  BP: 125/84 117/72  Pulse: 98 91  Temp: 98.1 F (36.7 C) 98.1 F (36.7 C)  Resp: 20 20    Intake/Output Summary (Last 24 hours) at 01/06/16 1746 Last data filed at 01/06/16 1115  Gross per 24 hour  Intake    860 ml  Output   3126 ml  Net  -2266 ml   Filed Weights   01/04/16 0520 01/05/16 0455 01/06/16 0335  Weight: 167.513 kg (369 lb 4.8 oz) 165.427 kg (364 lb 11.2 oz) 164.837 kg (363 lb 6.4 oz)    Exam:   General:  Obese, NAD  Cardiovascular: S 1, S 2 RRR  Respiratory: Distant breath sound,  bilateral crackles.   Abdomen: Bs resent, soft, obese  Musculoskeletal: bilateral trace edema has much improved, left knee slight warm and tender to touch, no erythema, no edema appreciated around left knee  Data Reviewed: Basic Metabolic Panel:  Recent Labs Lab 01/02/16 0653 01/03/16 0531 01/04/16 0515 01/05/16 0414 01/06/16 0420  NA 144 140 143 140 140  K 4.2 4.0 3.8 4.1 4.0  CL 106 101 98* 95* 90*  CO2 30 32 37* 35* 36*  GLUCOSE 75 197*  74 192* 295*  BUN 24* 19 17 22* 29*  CREATININE 1.25* 1.33* 1.30* 1.45* 1.47*  CALCIUM 8.4* 8.3* 8.7* 8.8* 9.2  MG 2.3 2.0  --   --   --   PHOS 3.5 3.5  --   --   --    Liver Function Tests: No results for input(s): AST, ALT, ALKPHOS, BILITOT, PROT, ALBUMIN in the last 168 hours. No results for input(s): LIPASE, AMYLASE in the last 168 hours. No results for input(s): AMMONIA in the last 168 hours. CBC:  Recent Labs Lab 01/01/16 1108 01/02/16 0856 01/03/16 0531 01/04/16 0515 01/05/16 0414 01/06/16 0420  WBC 13.0* 12.0* 9.2 9.2 9.4 11.5*  NEUTROABS 9.0*  --   --   --   --   --   HGB 15.2 13.5 12.9* 12.9* 12.5* 13.1  HCT 45.6 46.3 41.9 42.0 41.5 43.0  MCV 81.3 86.1 85.7 85.7 84.7 84.8  PLT 248 190 235 236 247 306   Cardiac Enzymes:  Recent Labs Lab 01/01/16 1940 01/02/16 0856  TROPONINI 0.05* 0.04*   BNP (last 3 results)  Recent Labs  10/04/15 1044 01/01/16 1108 01/02/16 0652  BNP 13.5 196.0* 173.7*    ProBNP (last 3 results) No results for input(s): PROBNP in the last 8760 hours.  CBG:  Recent Labs Lab 01/06/16 0120 01/06/16 0527 01/06/16 0819 01/06/16 1113 01/06/16 1702  GLUCAP 257* 233* 163* 253* 387*    Recent Results (from the past 240 hour(s))  Culture, blood (routine x 2)     Status: None   Collection Time: 01/01/16 11:42 AM  Result Value Ref Range Status   Specimen Description BLOOD RIGHT HAND  Final   Special Requests BOTTLES DRAWN AEROBIC AND ANAEROBIC 5CC  Final   Culture NO GROWTH 5 DAYS  Final   Report Status 01/06/2016 FINAL  Final  Culture, blood (routine x 2)     Status: None   Collection Time: 01/01/16 12:08 PM  Result Value Ref Range Status   Specimen Description BLOOD LEFT HAND  Final   Special Requests BOTTLES DRAWN AEROBIC AND ANAEROBIC 5CC  Final   Culture NO GROWTH 5 DAYS  Final   Report Status 01/06/2016 FINAL  Final  Urine culture     Status: None   Collection Time: 01/01/16  2:23 PM  Result Value Ref Range Status    Specimen Description URINE, RANDOM  Final   Special Requests NONE  Final   Culture NO GROWTH 1 DAY  Final   Report Status 01/02/2016 FINAL  Final  MRSA PCR Screening     Status: Abnormal   Collection Time: 01/02/16  4:59 AM  Result Value Ref Range Status   MRSA by PCR POSITIVE (A) NEGATIVE Final    Comment:        The GeneXpert MRSA Assay (FDA approved for NASAL specimens only), is one component of a comprehensive MRSA colonization surveillance program. It is not intended to diagnose MRSA infection nor to guide or monitor treatment for  MRSA infections. RESULT CALLED TO, READ BACK BY AND VERIFIED WITH: HAYES,C RN [id number] 01/02/16 MITCHELL,L      Studies: No results found.  Scheduled Meds: . antiseptic oral rinse  7 mL Mouth Rinse q12n4p  . aspirin  325 mg Oral Daily  . atorvastatin  40 mg Oral q1800  . chlorhexidine  15 mL Mouth Rinse BID  . colchicine  0.6 mg Oral Daily  . doxycycline  100 mg Oral Q12H  . febuxostat  40 mg Oral Daily  . fluticasone  1 spray Each Nare Daily  . guaiFENesin  600 mg Oral BID  . heparin subcutaneous  5,000 Units Subcutaneous 3 times per day  . insulin aspart  0-20 Units Subcutaneous TID WC & HS  . insulin detemir  30 Units Subcutaneous QHS  . [START ON 01/07/2016] insulin detemir  35 Units Subcutaneous Daily  . multivitamin with minerals  1 tablet Oral Daily  . mupirocin ointment  1 application Nasal BID  . pantoprazole  40 mg Oral QHS  . potassium chloride  40 mEq Oral Daily  . [START ON 01/07/2016] predniSONE  30 mg Oral Q breakfast  . sodium chloride  1,000 mL Intravenous Once  . torsemide  80 mg Oral BID   Continuous Infusions:    Active Problems:   Respiratory failure (HCC)   PNA (pneumonia)   HCAP (healthcare-associated pneumonia)   OSA on CPAP   Morbid obesity (HCC)    Time spent: 35 minutes    Dajon Lazar MD PhD  [facility] Pager [phone]. If 7PM-7AM, please contact night-coverage at [email or web address], password  [facility] 01/06/2016, 5:46 PM  LOS: 5 days

## 2016-01-07 DIAGNOSIS — Z954 Presence of other heart-valve replacement: Secondary | ICD-10-CM

## 2016-01-07 DIAGNOSIS — J189 Pneumonia, unspecified organism: Secondary | ICD-10-CM

## 2016-01-07 LAB — BASIC METABOLIC PANEL
ANION GAP: 13 (ref 5–15)
BUN: 47 mg/dL — ABNORMAL HIGH (ref 6–20)
CALCIUM: 9.2 mg/dL (ref 8.9–10.3)
CO2: 39 mmol/L — ABNORMAL HIGH (ref 22–32)
Chloride: 91 mmol/L — ABNORMAL LOW (ref 101–111)
Creatinine, Ser: 1.51 mg/dL — ABNORMAL HIGH (ref 0.61–1.24)
GFR, EST AFRICAN AMERICAN: 54 mL/min — AB (ref >60)
GFR, EST NON AFRICAN AMERICAN: 47 mL/min — AB (ref >60)
Glucose, Bld: 218 mg/dL — ABNORMAL HIGH (ref 65–99)
POTASSIUM: 3.9 mmol/L (ref 3.5–5.1)
Sodium: 143 mmol/L (ref 135–145)

## 2016-01-07 LAB — GLUCOSE, CAPILLARY
GLUCOSE-CAPILLARY: 337 mg/dL — AB (ref 65–99)
GLUCOSE-CAPILLARY: 366 mg/dL — AB (ref 65–99)
Glucose-Capillary: 208 mg/dL — ABNORMAL HIGH (ref 65–99)
Glucose-Capillary: 439 mg/dL — ABNORMAL HIGH (ref 65–99)
Glucose-Capillary: 388 mg/dL — ABNORMAL HIGH (ref 65–99)

## 2016-01-07 LAB — CBC
HEMATOCRIT: 42.8 % (ref 39.0–52.0)
Hemoglobin: 13 g/dL (ref 13.0–17.0)
MCH: 25.9 pg — ABNORMAL LOW (ref 26.0–34.0)
MCHC: 30.4 g/dL (ref 30.0–36.0)
MCV: 85.4 fL (ref 78.0–100.0)
PLATELETS: 293 10*3/uL (ref 150–400)
RBC: 5.01 MIL/uL (ref 4.22–5.81)
RDW: 15.6 % — AB (ref 11.5–15.5)
WBC: 12.2 10*3/uL — AB (ref 4.0–10.5)

## 2016-01-07 MED ORDER — INSULIN ASPART 100 UNIT/ML ~~LOC~~ SOLN: 8.0000 [IU] | Freq: Once | SUBCUTANEOUS | Status: DC

## 2016-01-07 NOTE — Progress Notes (Signed)
Patient ID: Antonio [name], male   DOB: 08-13-1951, 65 y.o.   MRN: [id number]     Advanced Heart Failure Rounding Note  Primary Physician: Dr. Leonette Most [facility]) Primary Cardiologist: Dr. Shirlee Latch   Subjective:    65 yo man with hx of morbid obesity, obesity hypoventilation  Admitted with increasing respiratory difficulty Has been found to have significant CO2 retention respiratory acidosis with metabolic compensation   VQ scan low probability, suspect no PE.  Improved quickly yesterday am. ABG with CO2 69.9. Was placed on CPAP. Also decreased evening dose of ambien. Says he feels much better this morning. Wore CPAP overnight and on and off during the day yesterday.   Creatinine stable. Diuretics decreased to torsemide 80 mg BID.       ABG: 01/05/16 =  7.36/70/74 suggesting compensated respiratory acidosis.    Objective:   Weight Range: 363 lb 11.2 oz (164.973 kg) Body mass index is 50.75 kg/(m^2).   Vital Signs:   Temp:  [97.8 F (36.6 C)-98.1 F (36.7 C)] 97.8 F (36.6 C) (03/11 0529) Pulse Rate:  [96-98] 96 (03/11 0529) Resp:  [18-20] 18 (03/11 0529) BP: (132-133)/(76-77) 132/76 mmHg (03/11 0529) SpO2:  [95 %-96 %] 96 % (03/11 0529) Weight:  [363 lb 11.2 oz (164.973 kg)-364 lb 6.4 oz (165.291 kg)] 363 lb 11.2 oz (164.973 kg) (03/11 0548) Last BM Date: 01/07/16  Weight change: Filed Weights   01/06/16 0335 01/07/16 0529 01/07/16 0548  Weight: 363 lb 6.4 oz (164.837 kg) 364 lb 6.4 oz (165.291 kg) 363 lb 11.2 oz (164.973 kg)    Intake/Output:   Intake/Output Summary (Last 24 hours) at 01/07/16 1250 Last data filed at 01/07/16 0900  Gross per 24 hour  Intake   1100 ml  Output   3575 ml  Net  -2475 ml    Physical Exam General: Obese, Well appearing HEENT: normal Neck: supple. JVP difficult to assess, does not appear elevated. Carotids 2+ bilat; no bruits. No thyromegaly or nodule noted. Cor: PMI nondisplaced. Distant. RRR. No M/G/R noted. Lungs: Slightly  diminished Abdomen: Morbidly obese, NT, ND, no HSM. No bruits or masses. +BS  Extremities: no cyanosis, clubbing, rash. Trace ankle edema Neuro: alert and oriented x 3. Cranial nerves grossly intact. Moves all 4 extremities w/o difficulty. Affect pleasant  Telemetry: Reviewed personally, NSR 90s  Labs: CBC  Recent Labs  01/06/16 0420 01/07/16 0517  WBC 11.5* 12.2*  HGB 13.1 13.0  HCT 43.0 42.8  MCV 84.8 85.4  PLT 306 293   Basic Metabolic Panel  Recent Labs  01/06/16 0420 01/07/16 0517  NA 140 143  K 4.0 3.9  CL 90* 91*  CO2 36* 39*  GLUCOSE 295* 218*  BUN 29* 47*  CREATININE 1.47* 1.51*  CALCIUM 9.2 9.2   Liver Function Tests No results for input(s): AST, ALT, ALKPHOS, BILITOT, PROT, ALBUMIN in the last 72 hours. No results for input(s): LIPASE, AMYLASE in the last 72 hours. Cardiac Enzymes No results for input(s): CKTOTAL, CKMB, CKMBINDEX, TROPONINI in the last 72 hours.  BNP: BNP (last 3 results)  Recent Labs  10/04/15 1044 01/01/16 1108 01/02/16 0652  BNP 13.5 196.0* 173.7*    ProBNP (last 3 results) No results for input(s): PROBNP in the last 8760 hours.   D-Dimer No results for input(s): DDIMER in the last 72 hours. Hemoglobin A1C No results for input(s): HGBA1C in the last 72 hours. Fasting Lipid Panel No results for input(s): CHOL, HDL, LDLCALC, TRIG, CHOLHDL, LDLDIRECT in the  last 72 hours. Thyroid Function Tests No results for input(s): TSH, T4TOTAL, T3FREE, THYROIDAB in the last 72 hours.  Invalid input(s): FREET3  Other results:  Imaging/Studies:  No results found.  Latest Echo  Latest Cath   Medications:     Scheduled Medications: . antiseptic oral rinse  7 mL Mouth Rinse q12n4p  . aspirin  325 mg Oral Daily  . atorvastatin  40 mg Oral q1800  . chlorhexidine  15 mL Mouth Rinse BID  . colchicine  0.6 mg Oral Daily  . doxycycline  100 mg Oral Q12H  . febuxostat  40 mg Oral Daily  . fluticasone  1 spray Each Nare  Daily  . guaiFENesin  600 mg Oral BID  . heparin subcutaneous  5,000 Units Subcutaneous 3 times per day  . insulin aspart  0-20 Units Subcutaneous TID WC & HS  . insulin detemir  30 Units Subcutaneous QHS  . multivitamin with minerals  1 tablet Oral Daily  . pantoprazole  40 mg Oral QHS  . potassium chloride  40 mEq Oral Daily  . predniSONE  30 mg Oral Q breakfast  . sodium chloride  1,000 mL Intravenous Once  . torsemide  80 mg Oral BID    Infusions:    PRN Medications: sodium chloride, zolpidem   Assessment/Plan   1. 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  Also getting empiric treatment for HCAP. VQ scan 01/02/16 with low probability for acute PE.  - Continue to wear CPAP nightly and prn.   2. Chronic diastolic CHF: Echo 01/01/16 60-65% with grade 2 diastolic dysfunction, severely reduced RV function (mild/mod on Echo in 01/2015). His RV does look worse than previous echoes. VQ scan low probability for acute PE.  Possibly RV failure from OHS/OSA. Volume status ok, continue current torsemide.   3. AKI on CKD stage III: Creatinine stable.    4. S/p Bioprosthetic AV  5. Morbid obesity: Continued to lose weight would most likely help with his disease state.  - Needs ongoing PT work.  6. OHS/OSA: He needs CPAP at home, may need home oxygen.  7. Disposition: He wants to go back to SNF for rehab.  HHPT recommended by PT.  He takes poor care of himself at home, but thus far is capable of helping himself.  Needs to work more with PT. Has declined past several days.   No new recs.   Dr. Shirlee Latch will see on Monday    Length of Stay: 6     Karishma Unrein, Deloris Ping, MD  01/07/2016 12:54 PM    [facility] [address] [place],  [address] [place], [place]  [address] Pager [phone] Phone: [phone]; Fax: [phone]   [facility]  [address] [place], [place]   [address] [phone]   Fax [phone]

## 2016-01-07 NOTE — Progress Notes (Signed)
[facility] PROGRESS NOTE  Antonio [name] MCN:[id number] DOB: 1951-02-11 DOA: 01/01/2016 PCP: [name], MD  Assessment/Plan: 65yo male with hx dCHF, CKD, morbid obesity, OSA non compliant with CPAP, severe AS s/p bioprosthetic aortic valve replacement 2013. Just d/c 3/1 from SNF where he was doing rehab after a hospitalization for acute on chronic CHF. He presents 3/5 with increased SOB, cough. In ER was hypotensive despite 3L fluids, mild respiratory distress and hypoxia requiring bipap. PCCM called to admit.   Per ex-wife/caretaker he was treated for bronchitis with prednisone and abx prior to d/c from SNF. He has not had fevers, chills, purulent sputum. BLE edema is at baseline. Denies hemoptysis, chest pain, abd pain, n/v/d.   Acute Hypoxic respiratory failure - likely multifactorial in setting untreated OSA/OHS, heart failure, URI v less likely HCAP.  RV dilated concerning for PE on ECHO--- was on heparin gtt whic was discontinue after V-Q scan showed low probability for PE>  CPAP while sleep  Continue with IV antibiotics to treat for Health care associated PNA>  Diuretics resume 3-07 by HF team.  BLE venous dopplers negative  Health care associated PNA;  Strep PNA negative.  WBC 13 on admission, now  normalized.  No fever Chest x ray on admission bilateral atelectasis, infiltrates.   on Vancomycin and Zosyn since admission, culture unrevealing, change abx to oral doxycycline on 3/9.  Complaining of  Nasal congestion, start Flonase. Also Guaifenesin to help with secretions.   Acute on Chronic Diastolic CHF , Severe AS s/p valve replacement 2013  On demadex, dose per heart failure team.  Patient reported lower extremity edema has much improved  SIRS/Hypotension - improved with volume. ?infectious source.  Treating for PNA>   CKD -Stage III  Baseline Scr ~1.8 Cr at 1.25, better than baseline.  Monitor on torsemide.   Insulin dependent Diabetes:   he had  hypoglycemia 3-06, but blood sugar started to be elevated after start on prednisone for gout , increase insulin dose  Encephalopathy , AMS - improved. suspect related to hypercapnia, OSA>  Hold sedating medications  Resolved.   History of Gout: on colchicine and Uloric. Complaining of left knee pain.  Reported has gout flare up when getting diuresis  Start low dose prednisone, left knee pain improved. Start taper steroids. Adjust insulin dose accordingly.  Code Status: Full Code.  Family Communication: care discussed with patient.  Disposition Plan: SNF in 1-2 days   Consultants:  HF team.   CMM admitted patient, transfer to [facility] 3[facility]07  Procedures:  VQ scan  Antibiotics:  Vancomycin 3-05 to 3/9  Zosyn 3-05 to 3/9  Doxycycline from 3/9-  HPI/Subjective: No Lethargy this am, report left knee pain has improved, blood sugar remain elevated  Objective: Filed Vitals:   01/07/16 0529 01/07/16 1354  BP: 132/76 143/72  Pulse: 96 98  Temp: 97.8 F (36.6 C) 97.9 F (36.6 C)  Resp: 18 18    Intake/Output Summary (Last 24 hours) at 01/07/16 1848 Last data filed at 01/07/16 1354  Gross per 24 hour  Intake   3460 ml  Output   3925 ml  Net   -465 ml   Filed Weights   01/06/16 0335 01/07/16 0529 01/07/16 0548  Weight: 164.837 kg (363 lb 6.4 oz) 165.291 kg (364 lb 6.4 oz) 164.973 kg (363 lb 11.2 oz)    Exam:   General:  Obese, NAD  Cardiovascular: S 1, S 2 RRR  Respiratory: Distant breath sound, bilateral crackles.  Abdomen: Bs resent, soft, obese  Musculoskeletal: bilateral trace edema has much improved, left knee slight warm and tender to touch, no erythema, no edema appreciated around left knee  Data Reviewed: Basic Metabolic Panel:  Recent Labs Lab 01/02/16 0653 01/03/16 0531 01/04/16 0515 01/05/16 0414 01/06/16 0420 01/07/16 0517  NA 144 140 143 140 140 143  K 4.2 4.0 3.8 4.1 4.0 3.9  CL 106 101 98* 95* 90* 91*  CO2 30 32 37* 35* 36* 39*   GLUCOSE 75 197* 74 192* 295* 218*  BUN 24* 19 17 22* 29* 47*  CREATININE 1.25* 1.33* 1.30* 1.45* 1.47* 1.51*  CALCIUM 8.4* 8.3* 8.7* 8.8* 9.2 9.2  MG 2.3 2.0  --   --   --   --   PHOS 3.5 3.5  --   --   --   --    Liver Function Tests: No results for input(s): AST, ALT, ALKPHOS, BILITOT, PROT, ALBUMIN in the last 168 hours. No results for input(s): LIPASE, AMYLASE in the last 168 hours. No results for input(s): AMMONIA in the last 168 hours. CBC:  Recent Labs Lab 01/01/16 1108  01/03/16 0531 01/04/16 0515 01/05/16 0414 01/06/16 0420 01/07/16 0517  WBC 13.0*  < > 9.2 9.2 9.4 11.5* 12.2*  NEUTROABS 9.0*  --   --   --   --   --   --   HGB 15.2  < > 12.9* 12.9* 12.5* 13.1 13.0  HCT 45.6  < > 41.9 42.0 41.5 43.0 42.8  MCV 81.3  < > 85.7 85.7 84.7 84.8 85.4  PLT 248  < > 235 236 247 306 293  < > = values in this interval not displayed. Cardiac Enzymes:  Recent Labs Lab 01/01/16 1940 01/02/16 0856  TROPONINI 0.05* 0.04*   BNP (last 3 results)  Recent Labs  10/04/15 1044 01/01/16 1108 01/02/16 0652  BNP 13.5 196.0* 173.7*    ProBNP (last 3 results) No results for input(s): PROBNP in the last 8760 hours.  CBG:  Recent Labs Lab 01/06/16 1702 01/06/16 2107 01/07/16 0613 01/07/16 1059 01/07/16 1614  GLUCAP 387* 371* 208* 337* 366*    Recent Results (from the past 240 hour(s))  Culture, blood (routine x 2)     Status: None   Collection Time: 01/01/16 11:42 AM  Result Value Ref Range Status   Specimen Description BLOOD RIGHT HAND  Final   Special Requests BOTTLES DRAWN AEROBIC AND ANAEROBIC 5CC  Final   Culture NO GROWTH 5 DAYS  Final   Report Status 01/06/2016 FINAL  Final  Culture, blood (routine x 2)     Status: None   Collection Time: 01/01/16 12:08 PM  Result Value Ref Range Status   Specimen Description BLOOD LEFT HAND  Final   Special Requests BOTTLES DRAWN AEROBIC AND ANAEROBIC 5CC  Final   Culture NO GROWTH 5 DAYS  Final   Report Status  01/06/2016 FINAL  Final  Urine culture     Status: None   Collection Time: 01/01/16  2:23 PM  Result Value Ref Range Status   Specimen Description URINE, RANDOM  Final   Special Requests NONE  Final   Culture NO GROWTH 1 DAY  Final   Report Status 01/02/2016 FINAL  Final  MRSA PCR Screening     Status: Abnormal   Collection Time: 01/02/16  4:59 AM  Result Value Ref Range Status   MRSA by PCR POSITIVE (A) NEGATIVE Final    Comment:  The GeneXpert MRSA Assay (FDA approved for NASAL specimens only), is one component of a comprehensive MRSA colonization surveillance program. It is not intended to diagnose MRSA infection nor to guide or monitor treatment for MRSA infections. RESULT CALLED TO, READ BACK BY AND VERIFIED WITH: HAYES,C RN [phone] 01/02/16 MITCHELL,L      Studies: No results found.  Scheduled Meds: . antiseptic oral rinse  7 mL Mouth Rinse q12n4p  . aspirin  325 mg Oral Daily  . atorvastatin  40 mg Oral q1800  . chlorhexidine  15 mL Mouth Rinse BID  . colchicine  0.6 mg Oral Daily  . doxycycline  100 mg Oral Q12H  . febuxostat  40 mg Oral Daily  . fluticasone  1 spray Each Nare Daily  . guaiFENesin  600 mg Oral BID  . heparin subcutaneous  5,000 Units Subcutaneous 3 times per day  . insulin aspart  0-20 Units Subcutaneous TID WC & HS  . insulin detemir  30 Units Subcutaneous QHS  . multivitamin with minerals  1 tablet Oral Daily  . pantoprazole  40 mg Oral QHS  . potassium chloride  40 mEq Oral Daily  . predniSONE  30 mg Oral Q breakfast  . sodium chloride  1,000 mL Intravenous Once  . torsemide  80 mg Oral BID   Continuous Infusions:    Active Problems:   Respiratory failure (HCC)   PNA (pneumonia)   HCAP (healthcare-associated pneumonia)   OSA on CPAP   Morbid obesity (HCC)   Insulin dependent diabetes mellitus (HCC)    Time spent: 25 minutes    Marlisa Caridi MD PhD  [facility] Pager [phone]. If 7PM-7AM, please contact night-coverage  at [email or web address], password [facility] 01/07/2016, 6:48 PM  LOS: 6 days

## 2016-01-08 DIAGNOSIS — E662 Morbid (severe) obesity with alveolar hypoventilation: Secondary | ICD-10-CM | POA: Insufficient documentation

## 2016-01-08 LAB — BASIC METABOLIC PANEL
ANION GAP: 13 (ref 5–15)
BUN: 48 mg/dL — ABNORMAL HIGH (ref 6–20)
CHLORIDE: 88 mmol/L — AB (ref 101–111)
CO2: 38 mmol/L — ABNORMAL HIGH (ref 22–32)
Calcium: 9.4 mg/dL (ref 8.9–10.3)
Creatinine, Ser: 1.34 mg/dL — ABNORMAL HIGH (ref 0.61–1.24)
GFR calc non Af Amer: 54 mL/min — ABNORMAL LOW (ref >60)
Glucose, Bld: 299 mg/dL — ABNORMAL HIGH (ref 65–99)
POTASSIUM: 4 mmol/L (ref 3.5–5.1)
SODIUM: 139 mmol/L (ref 135–145)

## 2016-01-08 LAB — GLUCOSE, CAPILLARY
GLUCOSE-CAPILLARY: 299 mg/dL — AB (ref 65–99)
GLUCOSE-CAPILLARY: 330 mg/dL — AB (ref 65–99)
GLUCOSE-CAPILLARY: 467 mg/dL — AB (ref 65–99)
Glucose-Capillary: 257 mg/dL — ABNORMAL HIGH (ref 65–99)
Glucose-Capillary: 263 mg/dL — ABNORMAL HIGH (ref 65–99)
Glucose-Capillary: 410 mg/dL — ABNORMAL HIGH (ref 65–99)

## 2016-01-08 LAB — CBC
HCT: 42.8 % (ref 39.0–52.0)
HEMOGLOBIN: 13.2 g/dL (ref 13.0–17.0)
MCH: 26.1 pg (ref 26.0–34.0)
MCHC: 30.8 g/dL (ref 30.0–36.0)
MCV: 84.6 fL (ref 78.0–100.0)
Platelets: 293 10*3/uL (ref 150–400)
RBC: 5.06 MIL/uL (ref 4.22–5.81)
RDW: 15.3 % (ref 11.5–15.5)
WBC: 13.7 10*3/uL — AB (ref 4.0–10.5)

## 2016-01-08 MED ORDER — INSULIN ASPART 100 UNIT/ML ~~LOC~~ SOLN
20.0000 [IU] | Freq: Once | SUBCUTANEOUS | Status: AC
  Administered 2016-01-08: 20 [IU] via SUBCUTANEOUS

## 2016-01-08 MED ORDER — INSULIN DETEMIR 100 UNIT/ML ~~LOC~~ SOLN
35.0000 [IU] | Freq: Every day | SUBCUTANEOUS | Status: DC
  Administered 2016-01-08: 35 [IU] via SUBCUTANEOUS
  Filled 2016-01-08 (×2): qty 0.35

## 2016-01-08 MED ORDER — INSULIN DETEMIR 100 UNIT/ML ~~LOC~~ SOLN
20.0000 [IU] | Freq: Every day | SUBCUTANEOUS | Status: DC
  Administered 2016-01-09: 20 [IU] via SUBCUTANEOUS
  Filled 2016-01-08: qty 0.2

## 2016-01-08 MED ORDER — PREDNISONE 20 MG PO TABS
20.0000 mg | ORAL_TABLET | Freq: Every day | ORAL | Status: DC
  Administered 2016-01-09: 20 mg via ORAL
  Filled 2016-01-08: qty 1

## 2016-01-08 NOTE — Progress Notes (Signed)
CSW provided patient with bed offers, however patient states "he needs family help to make a decision". CSW made patient aware that there is a anticipated discharge on tomorrow so CSW will need an answer in the morning. Patient aware and understands.   CSW will continue to follow and provide support to patient while in hospital.   Fernande Boyden, [facility] Clinical Social Worker [facility] Ph: [phone]

## 2016-01-08 NOTE — Progress Notes (Addendum)
[facility] PROGRESS NOTE  JABULANI FLEXER XIP:[id number] DOB: Aug 15, 1951 DOA: 01/01/2016 PCP: [name], MD  Assessment/Plan: 65yo male with hx dCHF, CKD, morbid obesity, OSA non compliant with CPAP, severe AS s/p bioprosthetic aortic valve replacement 2013. Just d/c 3/1 from SNF where he was doing rehab after a hospitalization for acute on chronic CHF. He presents 3/5 with increased SOB, cough. In ER was hypotensive despite 3L fluids, mild respiratory distress and hypoxia requiring bipap. PCCM called to admit.   Per ex-wife/caretaker he was treated for bronchitis with prednisone and abx prior to d/c from SNF. He has not had fevers, chills, purulent sputum. BLE edema is at baseline. Denies hemoptysis, chest pain, abd pain, n/v/d.   Acute Hypoxic respiratory failure -  likely multifactorial in setting untreated OSA/OHS, heart failure, URI v less likely HCAP.  RV dilated concerning for PE on ECHO--- was on heparin gtt whic was discontinue after V-Q scan showed low probability for PE>  CPAP while sleep  Continue with IV antibiotics to treat for Health care associated PNA>  Diuretics resume 3-07 by HF team.  BLE venous dopplers negative  Health care associated PNA;  Strep PNA negative.  WBC 13 on admission, now  normalized.  No fever Chest x ray on admission bilateral atelectasis, infiltrates.   on Vancomycin and Zosyn since admission, culture unrevealing, change abx to oral doxycycline on 3/9.  Complaining of  Nasal congestion, start Flonase. Also Guaifenesin to help with secretions.   Acute on Chronic Diastolic CHF , Severe AS s/p bioprosthetic valve replacement 2013  On demadex, dose per heart failure team.  Patient reported lower extremity edema has much improved  SIRS/Hypotension - improved with volume. ?infectious source.  Treating for PNA>   CKD -Stage III  Baseline Scr ~1.8 Cr at 1.25, better than baseline.  Monitor on torsemide.   Insulin dependent  Diabetes:   he had hypoglycemia 3-06, but blood sugar started to be elevated after start on prednisone for gout , continue to adjust insulin dose  Encephalopathy , AMS - improved. suspect related to hypercapnia, OSA>  Hold sedating medications  Resolved.   History of Gout: on colchicine and Uloric. Complaining of left knee pain.  Reported has gout flare up when getting diuresis  Start low dose prednisone, left knee pain improved. Start taper steroids. Adjust insulin dose accordingly.  Code Status: Full Code.  Family Communication: care discussed with patient.  Disposition Plan: SNF when bed available, hopefully 3/13.   Consultants:  HF team.   CMM admitted patient, transfer to [facility] 3[facility]07  Procedures:  VQ scan  Antibiotics:  Vancomycin 3-05 to 3/9  Zosyn 3-05 to 3/9  Doxycycline from 3/9-  HPI/Subjective: Over all feeling better,  report left knee pain has improved, blood sugar better controlled  Objective: Filed Vitals:   01/08/16 0505 01/08/16 1209  BP: 168/82 152/83  Pulse: 92 77  Temp: 98.5 F (36.9 C) 98.2 F (36.8 C)  Resp: 16 18    Intake/Output Summary (Last 24 hours) at 01/08/16 1558 Last data filed at 01/08/16 0600  Gross per 24 hour  Intake    540 ml  Output   3300 ml  Net  -2760 ml   Filed Weights   01/07/16 0529 01/07/16 0548 01/08/16 0505  Weight: 165.291 kg (364 lb 6.4 oz) 164.973 kg (363 lb 11.2 oz) 164.293 kg (362 lb 3.2 oz)    Exam:   General:  Obese, NAD  Cardiovascular: S 1, S 2 RRR  Respiratory:  Distant breath sound, bilateral crackles resolved  Abdomen: Bs resent, soft, obese  Musculoskeletal: bilateral trace edema has much improved, left knee slight warm and tender to touch, no erythema, no edema appreciated around left knee  Data Reviewed: Basic Metabolic Panel:  Recent Labs Lab 01/02/16 0653 01/03/16 0531 01/04/16 0515 01/05/16 0414 01/06/16 0420 01/07/16 0517 01/08/16 0448  NA 144 140 143 140 140 143 139   K 4.2 4.0 3.8 4.1 4.0 3.9 4.0  CL 106 101 98* 95* 90* 91* 88*  CO2 30 32 37* 35* 36* 39* 38*  GLUCOSE 75 197* 74 192* 295* 218* 299*  BUN 24* 19 17 22* 29* 47* 48*  CREATININE 1.25* 1.33* 1.30* 1.45* 1.47* 1.51* 1.34*  CALCIUM 8.4* 8.3* 8.7* 8.8* 9.2 9.2 9.4  MG 2.3 2.0  --   --   --   --   --   PHOS 3.5 3.5  --   --   --   --   --    Liver Function Tests: No results for input(s): AST, ALT, ALKPHOS, BILITOT, PROT, ALBUMIN in the last 168 hours. No results for input(s): LIPASE, AMYLASE in the last 168 hours. No results for input(s): AMMONIA in the last 168 hours. CBC:  Recent Labs Lab 01/04/16 0515 01/05/16 0414 01/06/16 0420 01/07/16 0517 01/08/16 0448  WBC 9.2 9.4 11.5* 12.2* 13.7*  HGB 12.9* 12.5* 13.1 13.0 13.2  HCT 42.0 41.5 43.0 42.8 42.8  MCV 85.7 84.7 84.8 85.4 84.6  PLT 236 247 306 293 293   Cardiac Enzymes:  Recent Labs Lab 01/01/16 1940 01/02/16 0856  TROPONINI 0.05* 0.04*   BNP (last 3 results)  Recent Labs  10/04/15 1044 01/01/16 1108 01/02/16 0652  BNP 13.5 196.0* 173.7*    ProBNP (last 3 results) No results for input(s): PROBNP in the last 8760 hours.  CBG:  Recent Labs Lab 01/07/16 2052 01/07/16 2259 01/08/16 0355 01/08/16 0627 01/08/16 1109  GLUCAP 439* 388* 299* 257* 263*    Recent Results (from the past 240 hour(s))  Culture, blood (routine x 2)     Status: None   Collection Time: 01/01/16 11:42 AM  Result Value Ref Range Status   Specimen Description BLOOD RIGHT HAND  Final   Special Requests BOTTLES DRAWN AEROBIC AND ANAEROBIC 5CC  Final   Culture NO GROWTH 5 DAYS  Final   Report Status 01/06/2016 FINAL  Final  Culture, blood (routine x 2)     Status: None   Collection Time: 01/01/16 12:08 PM  Result Value Ref Range Status   Specimen Description BLOOD LEFT HAND  Final   Special Requests BOTTLES DRAWN AEROBIC AND ANAEROBIC 5CC  Final   Culture NO GROWTH 5 DAYS  Final   Report Status 01/06/2016 FINAL  Final  Urine  culture     Status: None   Collection Time: 01/01/16  2:23 PM  Result Value Ref Range Status   Specimen Description URINE, RANDOM  Final   Special Requests NONE  Final   Culture NO GROWTH 1 DAY  Final   Report Status 01/02/2016 FINAL  Final  MRSA PCR Screening     Status: Abnormal   Collection Time: 01/02/16  4:59 AM  Result Value Ref Range Status   MRSA by PCR POSITIVE (A) NEGATIVE Final    Comment:        The GeneXpert MRSA Assay (FDA approved for NASAL specimens only), is one component of a comprehensive MRSA colonization surveillance program. It is not intended to  diagnose MRSA infection nor to guide or monitor treatment for MRSA infections. RESULT CALLED TO, READ BACK BY AND VERIFIED WITH: HAYES,C RN [phone] 01/02/16 MITCHELL,L      Studies: No results found.  Scheduled Meds: . antiseptic oral rinse  7 mL Mouth Rinse q12n4p  . aspirin  325 mg Oral Daily  . atorvastatin  40 mg Oral q1800  . chlorhexidine  15 mL Mouth Rinse BID  . colchicine  0.6 mg Oral Daily  . doxycycline  100 mg Oral Q12H  . febuxostat  40 mg Oral Daily  . fluticasone  1 spray Each Nare Daily  . guaiFENesin  600 mg Oral BID  . heparin subcutaneous  5,000 Units Subcutaneous 3 times per day  . insulin aspart  0-20 Units Subcutaneous TID WC & HS  . insulin detemir  30 Units Subcutaneous QHS  . multivitamin with minerals  1 tablet Oral Daily  . pantoprazole  40 mg Oral QHS  . potassium chloride  40 mEq Oral Daily  . [START ON 01/09/2016] predniSONE  20 mg Oral Q breakfast  . sodium chloride  1,000 mL Intravenous Once  . torsemide  80 mg Oral BID   Continuous Infusions:    Active Problems:   Respiratory failure (HCC)   PNA (pneumonia)   HCAP (healthcare-associated pneumonia)   OSA on CPAP   Morbid obesity (HCC)   Insulin dependent diabetes mellitus (HCC)    Time spent: 25 minutes    Havoc Sanluis MD PhD  [facility] Pager [phone]. If 7PM-7AM, please contact night-coverage at  [email or web address], password [facility] 01/08/2016, 3:58 PM  LOS: 7 days

## 2016-01-08 NOTE — Progress Notes (Signed)
Patient Name: Antonio [name] Date of Encounter: 01/08/2016  Active Problems:   Respiratory failure (HCC)   PNA (pneumonia)   HCAP (healthcare-associated pneumonia)   OSA on CPAP   Morbid obesity (HCC)   Insulin dependent diabetes mellitus [facility])   Primary Cardiologist: Dr Shirlee Latch Patient Profile: 65 yo man with hx of morbid obesity, obesity hypoventilation Admitted with increasing respiratory difficulty, had significant CO2 retention respiratory acidosis with metabolic compensation. Also with volume overload, ?2nd IVF.  SUBJECTIVE: Breathing better, was watching Na and calories, fluid intake PTA. Breathing better, started getting up but requires assistance  OBJECTIVE Filed Vitals:   01/07/16 0548 01/07/16 1354 01/07/16 2057 01/08/16 0505  BP:  143/72 139/67 168/82  Pulse:  98 98 92  Temp:  97.9 F (36.6 C)  98.5 F (36.9 C)  TempSrc:  Oral  Oral  Resp:  18 18 16   Height:      Weight: 363 lb 11.2 oz (164.973 kg)   362 lb 3.2 oz (164.293 kg)  SpO2:  98% 93% 90%    Intake/Output Summary (Last 24 hours) at 01/08/16 1131 Last data filed at 01/08/16 0600  Gross per 24 hour  Intake    940 ml  Output   3600 ml  Net  -2660 ml   Filed Weights   01/07/16 0529 01/07/16 0548 01/08/16 0505  Weight: 364 lb 6.4 oz (165.291 kg) 363 lb 11.2 oz (164.973 kg) 362 lb 3.2 oz (164.293 kg)    PHYSICAL EXAM General: Well developed, well nourished, male in no acute distress. Head: Normocephalic, atraumatic.  Neck: Supple without bruits, JVD not able to assess 2nd body habitus. Lungs:  Resp regular and unlabored, decreased BS bases w/ rales Heart: RRR, S1, S2, no S3, S4, or murmur; no rub. Abdomen: Soft, non-tender, non-distended, BS + x 4.  Extremities: No clubbing, cyanosis, edema.  Neuro: Alert and oriented X 3. Moves all extremities spontaneously. Psych: Normal affect.  LABS: CBC: Recent Labs  01/07/16 0517 01/08/16 0448  WBC 12.2* 13.7*  HGB 13.0 13.2  HCT 42.8 42.8  MCV  85.4 84.6  PLT 293 293   Basic Metabolic Panel: Recent Labs  01/07/16 0517 01/08/16 0448  NA 143 139  K 3.9 4.0  CL 91* 88*  CO2 39* 38*  GLUCOSE 218* 299*  BUN 47* 48*  CREATININE 1.51* 1.34*  CALCIUM 9.2 9.4   BNP:  B NATRIURETIC PEPTIDE  Date/Time Value Ref Range Status  01/02/2016 06:52 AM 173.7* 0.0 - 100.0 pg/mL Final  01/01/2016 11:08 AM 196.0* 0.0 - 100.0 pg/mL Final   TELE:  SR, PVCs     Radiology/Studies: Nm Pulmonary Perf And Vent 01/02/2016  CLINICAL DATA:  Shortness of breath for 4 days. History of aortic valve replacement. Evaluate for pulmonary embolism. EXAM: NUCLEAR MEDICINE VENTILATION - PERFUSION LUNG SCAN TECHNIQUE: Ventilation images were obtained in multiple projections using inhaled aerosol Tc-17m DTPA. Perfusion images were obtained in multiple projections after intravenous injection of Tc-32m MAA. RADIOPHARMACEUTICALS:  30.1 millicurie Technetium-25m DTPA aerosol inhalation and 4.05 millicurie Technetium-46m MAA IV COMPARISON:  Portable chest same date. FINDINGS: Ventilation: Limited by body habitus and central clumping of the aerosol in the tracheobronchial tree. No focal ventilatory defects identified. Perfusion: No wedge shaped peripheral perfusion defects to suggest acute pulmonary embolism. The perfusion scan appears matched to the ventilatory exam. IMPRESSION: Low probability for acute pulmonary embolism. Electronically Signed   By: Carey Bullocks M.D.   On: 01/02/2016 17:12   Dg Chest  Port 1 View 01/02/2016  CLINICAL DATA:  Respiratory failure EXAM: PORTABLE CHEST 1 VIEW COMPARISON:  01/01/2016 FINDINGS: Cardiac shadow is enlarged but stable. Postsurgical changes are again seen. The lungs are well aerated bilaterally with bibasilar atelectatic changes. No focal infiltrate is seen. No acute bony abnormality is noted. IMPRESSION: Bibasilar atelectatic changes stable from the previous exam. Electronically Signed   By: Alcide Clever M.D.   On: 01/02/2016 07:13    Dg Chest Port 1 View 01/01/2016  CLINICAL DATA:  Pt c/o shortness of breath 5-7 days, worse this morning. He denies chest pain. Was recently d/c'd from nursing home after 100 day stay for "a vascular problem with swelling everywhere". He states was dx'd w/ bronchitis EXAM: PORTABLE CHEST - 1 VIEW COMPARISON:  09/15/2015 FINDINGS: Low lung volumes. Bibasilar patchy atelectasis or infiltrate. Heart size upper limits normal for technique and degree of inspiration. Previous median sternotomy. No pneumothorax. Can't exclude small pleural effusions. IMPRESSION: 1. Low lung volumes with bibasilar atelectasis or infiltrate. 2. Previous median sternotomy. Electronically Signed   By: Corlis Leak M.D.   On: 01/01/2016 11:06      Current Medications:  . antiseptic oral rinse  7 mL Mouth Rinse q12n4p  . aspirin  325 mg Oral Daily  . atorvastatin  40 mg Oral q1800  . chlorhexidine  15 mL Mouth Rinse BID  . colchicine  0.6 mg Oral Daily  . doxycycline  100 mg Oral Q12H  . febuxostat  40 mg Oral Daily  . fluticasone  1 spray Each Nare Daily  . guaiFENesin  600 mg Oral BID  . heparin subcutaneous  5,000 Units Subcutaneous 3 times per day  . insulin aspart  0-20 Units Subcutaneous TID WC & HS  . insulin detemir  30 Units Subcutaneous QHS  . multivitamin with minerals  1 tablet Oral Daily  . pantoprazole  40 mg Oral QHS  . potassium chloride  40 mEq Oral Daily  . predniSONE  30 mg Oral Q breakfast  . sodium chloride  1,000 mL Intravenous Once  . torsemide  80 mg Oral BID      ASSESSMENT AND PLAN: 1. 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 Also getting empiric treatment for HCAP. VQ scan 01/02/16 with low probability for acute PE.  - Continue to wear CPAP nightly and prn.  - add incentive spirometry  2. Chronic diastolic CHF: Echo 01/01/16 60-65% with grade 2 diastolic dysfunction, severely reduced RV function (mild/mod on Echo in  01/2015). His RV does look worse than previous echoes. VQ scan low probability for acute PE. Possibly RV failure from OHS/OSA. Volume status ok, continue current torsemide.   3. AKI on CKD stage III: Creatinine stable.   4. S/p Bioprosthetic AV  5. Morbid obesity: Continued to lose weight would most likely help with his disease state.  - Needs ongoing PT work.   6. OHS/OSA: He needs CPAP at home, may need home oxygen.   7. Disposition: He wants to go back to SNF for rehab. HHPT recommended by PT. He takes poor care of himself at home, but thus far is capable of helping himself. Needs to work more with PT. Has declined past several days.   Otherwise, per IM Active Problems:   Respiratory failure (HCC)   PNA (pneumonia)   HCAP (healthcare-associated pneumonia)   OSA on CPAP   Morbid obesity (HCC)   Insulin dependent diabetes mellitus (HCC)  Attending Note:   The  patient was seen and examined.  Agree with assessment and plan as noted above.  Changes made to the above note as needed.  No active cardiac issues. Has obesity hypoventilation  Further plans per Dr. Nada Libman, Montez Hageman., MD, [facility] 01/08/2016, 12:11 PM [address],  [address] Office - [phone] Pager [phone]    Signed, Theodore Demark , [place] 11:31 AM 01/08/2016

## 2016-01-09 DIAGNOSIS — E662 Morbid (severe) obesity with alveolar hypoventilation: Secondary | ICD-10-CM

## 2016-01-09 LAB — GLUCOSE, CAPILLARY
GLUCOSE-CAPILLARY: 182 mg/dL — AB (ref 65–99)
Glucose-Capillary: 262 mg/dL — ABNORMAL HIGH (ref 65–99)

## 2016-01-09 LAB — CBC
HCT: 44.3 % (ref 39.0–52.0)
HEMOGLOBIN: 13.6 g/dL (ref 13.0–17.0)
MCH: 26 pg (ref 26.0–34.0)
MCHC: 30.7 g/dL (ref 30.0–36.0)
MCV: 84.5 fL (ref 78.0–100.0)
PLATELETS: 277 10*3/uL (ref 150–400)
RBC: 5.24 MIL/uL (ref 4.22–5.81)
RDW: 15.3 % (ref 11.5–15.5)
WBC: 14.7 10*3/uL — AB (ref 4.0–10.5)

## 2016-01-09 LAB — BASIC METABOLIC PANEL
Anion gap: 11 (ref 5–15)
BUN: 55 mg/dL — AB (ref 6–20)
CO2: 42 mmol/L — ABNORMAL HIGH (ref 22–32)
Calcium: 9.5 mg/dL (ref 8.9–10.3)
Chloride: 87 mmol/L — ABNORMAL LOW (ref 101–111)
Creatinine, Ser: 1.45 mg/dL — ABNORMAL HIGH (ref 0.61–1.24)
GFR, EST AFRICAN AMERICAN: 57 mL/min — AB (ref >60)
GFR, EST NON AFRICAN AMERICAN: 49 mL/min — AB (ref >60)
Glucose, Bld: 187 mg/dL — ABNORMAL HIGH (ref 65–99)
POTASSIUM: 3.7 mmol/L (ref 3.5–5.1)
SODIUM: 140 mmol/L (ref 135–145)

## 2016-01-09 MED ORDER — POTASSIUM CHLORIDE CRYS ER 20 MEQ PO TBCR: 40.0000 meq | EXTENDED_RELEASE_TABLET | Freq: Every day | ORAL | Status: DC

## 2016-01-09 MED ORDER — POTASSIUM CHLORIDE CRYS ER 20 MEQ PO TBCR
40.0000 meq | EXTENDED_RELEASE_TABLET | Freq: Once | ORAL | Status: AC
  Administered 2016-01-09: 40 meq via ORAL
  Filled 2016-01-09: qty 2

## 2016-01-09 MED ORDER — DOXYCYCLINE HYCLATE 100 MG PO TABS: 100.0000 mg | ORAL_TABLET | Freq: Two times a day (BID) | ORAL | Status: DC

## 2016-01-09 MED ORDER — HYDROCODONE-ACETAMINOPHEN 7.5-325 MG PO TABS: 1.0000 | ORAL_TABLET | ORAL | Status: DC | PRN

## 2016-01-09 MED ORDER — FLUTICASONE PROPIONATE 50 MCG/ACT NA SUSP: 1.0000 | Freq: Every day | NASAL | Status: AC

## 2016-01-09 MED ORDER — TORSEMIDE 20 MG PO TABS: 80.0000 mg | ORAL_TABLET | Freq: Two times a day (BID) | ORAL | Status: DC

## 2016-01-09 MED ORDER — SALINE SPRAY 0.65 % NA SOLN: 1.0000 | NASAL | Status: DC | PRN

## 2016-01-09 MED ORDER — INSULIN DETEMIR 100 UNIT/ML ~~LOC~~ SOLN: SUBCUTANEOUS | Status: DC

## 2016-01-09 NOTE — Progress Notes (Signed)
Patient ID: Antonio [name], male   DOB: December 26, 1950, 65 y.o.   MRN: [id number]     Advanced Heart Failure Rounding Note  Primary Physician: Dr. Leonette Most [facility]) Primary Cardiologist: Dr. Shirlee Latch   Subjective:    VQ scan low probability, suspect no PE.  Improved quickly yesterday am. ABG with CO2 69.9. Was placed on CPAP. Also decreased evening dose of ambien. Says he feels much better this morning. Wore CPAP overnight and on and off during the day yesterday.   Creatinine stable on torsemide 80 mg BID.  He's had good diuresis over the past several days.  Averaging ~2L negative daily.  Has been wearing CPAP and states he has noticed a difference.  Feels better and has gotten more used to mask. Still has to take off sometimes due to discomfort.   ABG: 01/05/16 =  7.36/70/74 suggesting compensated respiratory acidosis.    Objective:   Weight Range: 361 lb 5.3 oz (163.9 kg) Body mass index is 50.42 kg/(m^2).   Vital Signs:   Temp:  [98.2 F (36.8 C)-98.8 F (37.1 C)] 98.8 F (37.1 C) (03/13 0500) Pulse Rate:  [77-95] 95 (03/13 0500) Resp:  [18-19] 19 (03/13 0500) BP: (136-152)/(67-83) 138/74 mmHg (03/13 0500) SpO2:  [93 %-96 %] 96 % (03/13 0500) Weight:  [361 lb 5.3 oz (163.9 kg)] 361 lb 5.3 oz (163.9 kg) (03/13 0500) Last BM Date: 01/07/16  Weight change: Filed Weights   01/07/16 0548 01/08/16 0505 01/09/16 0500  Weight: 363 lb 11.2 oz (164.973 kg) 362 lb 3.2 oz (164.293 kg) 361 lb 5.3 oz (163.9 kg)    Intake/Output:   Intake/Output Summary (Last 24 hours) at 01/09/16 5797 Last data filed at 01/09/16 0200  Gross per 24 hour  Intake    820 ml  Output   3625 ml  Net  -2805 ml    Physical Exam General: Obese, Well appearing HEENT: normal Neck: supple. JVP difficult to assess 2/2 girth, does not appear elevated. Carotids 2+ bilat; no bruits. No thyromegaly or lymphadenopathy noted. Cor: PMI nondisplaced. Distant. RRR. No M/G/R noted. Lungs: Mildly diminished basilar  sounds. Normal effort. Abdomen: Morbidly obese, non-tender, non-distended, no HSM. No bruits or masses. +BS  Extremities: no cyanosis, clubbing, rash. Trace ankle edema Neuro: alert and oriented x 3. Cranial nerves grossly intact. Moves all 4 extremities w/o difficulty. Affect pleasant  Telemetry: Reviewed personally, NSR 90s  Labs: CBC  Recent Labs  01/08/16 0448 01/09/16 0430  WBC 13.7* 14.7*  HGB 13.2 13.6  HCT 42.8 44.3  MCV 84.6 84.5  PLT 293 277   Basic Metabolic Panel  Recent Labs  01/08/16 0448 01/09/16 0430  NA 139 140  K 4.0 3.7  CL 88* 87*  CO2 38* 42*  GLUCOSE 299* 187*  BUN 48* 55*  CREATININE 1.34* 1.45*  CALCIUM 9.4 9.5   Liver Function Tests No results for input(s): AST, ALT, ALKPHOS, BILITOT, PROT, ALBUMIN in the last 72 hours. No results for input(s): LIPASE, AMYLASE in the last 72 hours. Cardiac Enzymes No results for input(s): CKTOTAL, CKMB, CKMBINDEX, TROPONINI in the last 72 hours.  BNP: BNP (last 3 results)  Recent Labs  10/04/15 1044 01/01/16 1108 01/02/16 0652  BNP 13.5 196.0* 173.7*    ProBNP (last 3 results) No results for input(s): PROBNP in the last 8760 hours.   D-Dimer No results for input(s): DDIMER in the last 72 hours. Hemoglobin A1C No results for input(s): HGBA1C in the last 72 hours. Fasting Lipid Panel No  results for input(s): CHOL, HDL, LDLCALC, TRIG, CHOLHDL, LDLDIRECT in the last 72 hours. Thyroid Function Tests No results for input(s): TSH, T4TOTAL, T3FREE, THYROIDAB in the last 72 hours.  Invalid input(s): FREET3  Other results:  Imaging/Studies:  No results found.  Latest Echo  Latest Cath   Medications:     Scheduled Medications: . antiseptic oral rinse  7 mL Mouth Rinse q12n4p  . aspirin  325 mg Oral Daily  . atorvastatin  40 mg Oral q1800  . chlorhexidine  15 mL Mouth Rinse BID  . colchicine  0.6 mg Oral Daily  . doxycycline  100 mg Oral Q12H  . febuxostat  40 mg Oral Daily  .  fluticasone  1 spray Each Nare Daily  . guaiFENesin  600 mg Oral BID  . heparin subcutaneous  5,000 Units Subcutaneous 3 times per day  . insulin aspart  0-20 Units Subcutaneous TID WC & HS  . insulin detemir  20 Units Subcutaneous Daily  . insulin detemir  35 Units Subcutaneous QHS  . multivitamin with minerals  1 tablet Oral Daily  . pantoprazole  40 mg Oral QHS  . potassium chloride  40 mEq Oral Daily  . predniSONE  20 mg Oral Q breakfast  . sodium chloride  1,000 mL Intravenous Once  . torsemide  80 mg Oral BID    Infusions:    PRN Medications: sodium chloride, zolpidem   Assessment/Plan   1. 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  Also getting empiric treatment for HCAP. VQ scan 01/02/16 with low probability for acute PE.  - Much improved. - Continue to wear CPAP nightly and prn.  2. Chronic diastolic CHF: Echo 01/01/16 60-65% with grade 2 diastolic dysfunction, severely reduced RV function (mild/mod on Echo in 01/2015). His RV does look worse than previous echoes. VQ scan low probability for acute PE.  Possibly RV failure from OHS/OSA. - Volume status stable. Continue po torsemide. 3. AKI on CKD stage III: Creatinine stable.   4. S/p Bioprosthetic AV 5. Morbid obesity: Continued to lose weight would most likely help with his disease state.  - Needs ongoing PT work.  6. OHS/OSA: Needs home CPAP. May need home oxygen.  7. Disposition:  - He is stable from a cardiac standpoint. He is awaiting discharge to SNF.   He has HF follow up set up for 01/20/16.  Length of Stay: [address]  Luane School 01/09/2016, 7:22 AM  Advanced Heart Failure Team Pager [phone] (M-F; 7a - 4p)  Please contact [facility] for night-coverage after hours (4p -7a ) and weekends on [email or web address]  Patient seen with PA, agree with the above note.  He should be ok to go to SNF on the current medical regimen, with torsemide 80  mg bid.  He will need BMET in about a week and we'll arrange followup for him.  Needs CPAP at night and oxygen during the day for OHS/OSA.   [name] 01/09/2016

## 2016-01-09 NOTE — Discharge Summary (Addendum)
Discharge Summary  Antonio [name] MVH:[id number] DOB: 12/27/1950  PCP: [name], MD  Admit date: 01/01/2016 Discharge date: 01/09/2016  Time spent: >56mins  Recommendations for Outpatient Follow-up:  1. F/u with SNF MD, repeat cbc/bmp in one week 2. F/u with cardiology on 01/20/2016 3. Patient need to have cpap qhs, can start with 5cmH20, and titrate up as tolerated,  avoid oversedation at night, patient has tendency to develop co2 narcosis. Arrange outpatient sleep study. 4. Wean oxygen at SNF  Discharge Diagnoses:  Active Hospital Problems   Diagnosis Date Noted  . Obesity hypoventilation syndrome (HCC)   . Insulin dependent diabetes mellitus (HCC)   . HCAP (healthcare-associated pneumonia)   . OSA on CPAP   . Morbid obesity (HCC)   . PNA (pneumonia) 01/03/2016  . Respiratory failure (HCC) 01/01/2016    Resolved Hospital Problems   Diagnosis Date Noted Date Resolved  No resolved problems to display.    Discharge Condition: stable  Diet recommendation: heart healthy/carb modified  Filed Weights   01/07/16 0548 01/08/16 0505 01/09/16 0500  Weight: 164.973 kg (363 lb 11.2 oz) 164.293 kg (362 lb 3.2 oz) 163.9 kg (361 lb 5.3 oz)    History of present illness:  (per ICU attending Dr Delphina Cahill de dios) 65 y.o. year old male, admitted for respiratory distress. Patient has multiple comorbidities including untreated sleep apnea, morbid obesity, CAD, CHF, chronic edema. Patient was admitted at a rehabilitation for more than 3 months and was recently discharged. Recent cough and congestion and more dyspnea than baseline the last 5 days. Came into the emergency room because of drowsiness and not feeling well. He was hypotensive in the 70s systolic at the ER. He was drowsy as well. Workup unrevealing. Creatinine was elevated so CTA was not done. Chest x-ray bibasal basilar atelectasis, also will infiltrates. Initial lactic acid was normal but repeat is mildly elevated. Patient  responded to IV fluids. He received 3 L. He was more awake and responsive on BiPAP.   On exam, more awake, follows command. 90/70,90 heart rate, 99% on BiPAP. No Fever. Morbidly obese. Short stout neck. Diminished breath sounds bilaterally. Crackles at the bases. Good S1 and S2. Soft abdomen. No tenderness elicited. Chronic edema, grade 2. Erythema in both lower extremities.   Labs reviewed. Pertinent labs include the following: WBC 13. Creatinine 1.6. VBG with pH 7.45, CO2 49, PO2 69, undocumented FiO2. Chest x-ray with low lung volumes.  Hospital Course:  Active Problems:   Respiratory failure (HCC)   PNA (pneumonia)   HCAP (healthcare-associated pneumonia)   OSA on CPAP   Morbid obesity (HCC)   Insulin dependent diabetes mellitus (HCC)   Obesity hypoventilation syndrome (HCC)  Acute Hypoxic respiratory failure - likely multifactorial in setting untreated OSA/OHS, heart failure, URI v less likely HCAP.  RV dilated concerning for PE on ECHO--- was on heparin gtt whic was discontinue after V-Q scan showed low probability for PE>  CPAP while sleep  Continue with IV antibiotics to treat for Health care associated PNA>  Diuretics per Heart Failure team.  BLE venous dopplers negative Patient is discharge to [facility] wit h3liter 24/7, wean oxygen at SNF.  Health care associated PNA;  Strep PNA negative.  WBC 13 on admission, now normalized. No fever Chest x ray on admission bilateral atelectasis, infiltrates.  on Vancomycin and Zosyn since admission, culture unrevealing, change abx to oral doxycycline on 3/9.  Complaining of Nasal congestion, start Flonase. Also Guaifenesin to help with secretions.  No  cough at discharge, continue doxycycline for two days to finish abx course.  Acute on Chronic Diastolic CHF , Severe AS s/p bioprosthetic valve replacement 2013  On demadex, dose per heart failure team.  Patient reported lower extremity edema has much improved Discharged on  demadex 80mg  bid. And follow up with cardiology on 3/24  SIRS/Hypotension - improved with volume. ?infectious source.  Treating for PNA>  bp stable at discharge, no leukocytosis, no fever, no cough  CKD -Stage III Baseline Scr ~1.8 Cr at 1.25, better than baseline.  Monitor on torsemide.   Insulin dependent Diabetes:  he had hypoglycemia 3-06, but blood sugar started to be elevated after start on prednisone for gout , continue to adjust insulin dose  Encephalopathy , AMS - improved. suspect related to hypercapnia, OSA>  Hold sedating medications  Resolved.   History of Gout: on colchicine and Uloric. Complaining of left knee pain. Reported has gout flare up when getting diuresis  Start low dose prednisone, left knee pain improved. Start taper steroids. Adjust insulin dose accordingly.  Code Status: Full Code.  Family Communication: care discussed with patient.  Disposition Plan: SNF on 3/13.   Consultants:  HF team.   CMM admitted patient, transfer to [facility] 3-07  Procedures:  VQ scan  Antibiotics:  Vancomycin 3-05 to 3/9  Zosyn 3-05 to 3/9  Doxycycline from 3/9-   Discharge Exam: BP 138/74 mmHg  Pulse 95  Temp(Src) 98.8 F (37.1 C) (Oral)  Resp 19  Ht 5\' 11"  (1.803 m)  Wt 163.9 kg (361 lb 5.3 oz)  BMI 50.42 kg/m2  SpO2 96%   General: Obese, NAD  Cardiovascular: S 1, S 2 RRR  Respiratory: Distant breath sound, bilateral crackles resolved  Abdomen: Bs resent, soft, obese  Musculoskeletal: bilateral trace edema has much improved, left knee  warmness and tenderness has resolved,no erythema, no edema appreciated around left knee   Discharge Instructions You were cared for by a hospitalist during your hospital stay. If you have any questions about your discharge medications or the care you received while you were in the hospital after you are discharged, you can call the unit and asked to speak with the hospitalist on call if the hospitalist  that took care of you is not available. Once you are discharged, your primary care physician will handle any further medical issues. Please note that NO REFILLS for any discharge medications will be authorized once you are discharged, as it is imperative that you return to your primary care physician (or establish a relationship with a primary care physician if you do not have one) for your aftercare needs so that they can reassess your need for medications and monitor your lab values.      Discharge Instructions    Diet - low sodium heart healthy    Complete by:  As directed   Carb modified     Discharge instructions    Complete by:  As directed   Continue cpap qhs for OSA and OHS     Increase activity slowly    Complete by:  As directed             Medication List    STOP taking these medications        allopurinol 100 MG tablet  Commonly known as:  ZYLOPRIM     docusate sodium 100 MG capsule  Commonly known as:  COLACE     ferrous sulfate 325 (65 FE) MG tablet     lisinopril 5  MG tablet  Commonly known as:  PRINIVIL,ZESTRIL     temazepam 30 MG capsule  Commonly known as:  RESTORIL      TAKE these medications        aspirin 325 MG tablet  Take 325 mg by mouth daily.     atorvastatin 40 MG tablet  Commonly known as:  LIPITOR  Take 40 mg by mouth daily at 6 PM.     colchicine 0.6 MG tablet  Take 0.6 mg by mouth daily.     doxycycline 100 MG tablet  Commonly known as:  VIBRA-TABS  Take 1 tablet (100 mg total) by mouth every 12 (twelve) hours.     febuxostat 40 MG tablet  Commonly known as:  ULORIC  Take 40 mg by mouth daily.     fenofibrate 54 MG tablet  Take 54 mg by mouth daily.     fluticasone 50 MCG/ACT nasal spray  Commonly known as:  FLONASE  Place 1 spray into both nostrils daily.     hydrocerin Crea  Apply 1 application topically 2 (two) times daily.     HYDROcodone-acetaminophen 7.5-325 MG tablet  Commonly known as:  NORCO  Take 1 tablet by  mouth every 4 (four) hours as needed for moderate pain.     hydrOXYzine 25 MG tablet  Commonly known as:  ATARAX/VISTARIL  Take 25 mg by mouth every 6 (six) hours as needed for itching.     insulin detemir 100 UNIT/ML injection  Commonly known as:  LEVEMIR  25units qam and 30 units qhs     lactobacillus acidophilus Tabs tablet  Take 2 tablets by mouth 2 (two) times daily.     lidocaine 3 % Crea cream  Commonly known as:  LINDAMANTLE  Apply 1 application topically 3 (three) times daily as needed (rash).     multivitamin tablet  Take 1 tablet by mouth daily.     NOVOLOG 100 UNIT/ML injection  Generic drug:  insulin aspart  Inject 30-40 Units into the skin 3 (three) times daily with meals. Sliding scale     polyethylene glycol packet  Commonly known as:  MIRALAX / GLYCOLAX  Take 17 g by mouth 2 (two) times daily.     potassium chloride SA 20 MEQ tablet  Commonly known as:  K-DUR,KLOR-CON  Take 2 tablets (40 mEq total) by mouth daily. Take 3 tabs in AM and 2 tabs in PM     sodium chloride 0.65 % Soln nasal spray  Commonly known as:  OCEAN  Place 1 spray into both nostrils as needed for congestion.     torsemide 20 MG tablet  Commonly known as:  DEMADEX  Take 4 tablets (80 mg total) by mouth 2 (two) times daily.     vitamin C 500 MG tablet  Commonly known as:  ASCORBIC ACID  Take 1,000 mg by mouth 2 (two) times daily.       Allergies  Allergen Reactions  . Other Other (See Comments)    Salt causes "extreme" edema.   Follow-up Information    Follow up with [facility] On 01/20/2016.   Specialty:  Cardiology   Why:  at 1100 for post hospital follow up. Please bring all of your medications/MAR from SNF.  The code for parking is 0100.   Contact information:   [address] [id number] mc [place] [address] [phone]       The results of significant diagnostics from this  hospitalization (including  imaging, microbiology, ancillary and laboratory) are listed below for reference.    Significant Diagnostic Studies: Nm Pulmonary Perf And Vent  01/02/2016  CLINICAL DATA:  Shortness of breath for 4 days. History of aortic valve replacement. Evaluate for pulmonary embolism. EXAM: NUCLEAR MEDICINE VENTILATION - PERFUSION LUNG SCAN TECHNIQUE: Ventilation images were obtained in multiple projections using inhaled aerosol Tc-40m DTPA. Perfusion images were obtained in multiple projections after intravenous injection of Tc-69m MAA. RADIOPHARMACEUTICALS:  30.1 millicurie Technetium-57m DTPA aerosol inhalation and 4.05 millicurie Technetium-23m MAA IV COMPARISON:  Portable chest same date. FINDINGS: Ventilation: Limited by body habitus and central clumping of the aerosol in the tracheobronchial tree. No focal ventilatory defects identified. Perfusion: No wedge shaped peripheral perfusion defects to suggest acute pulmonary embolism. The perfusion scan appears matched to the ventilatory exam. IMPRESSION: Low probability for acute pulmonary embolism. Electronically Signed   By: Carey Bullocks M.D.   On: 01/02/2016 17:12   Dg Chest Port 1 View  01/02/2016  CLINICAL DATA:  Respiratory failure EXAM: PORTABLE CHEST 1 VIEW COMPARISON:  01/01/2016 FINDINGS: Cardiac shadow is enlarged but stable. Postsurgical changes are again seen. The lungs are well aerated bilaterally with bibasilar atelectatic changes. No focal infiltrate is seen. No acute bony abnormality is noted. IMPRESSION: Bibasilar atelectatic changes stable from the previous exam. Electronically Signed   By: Alcide Clever M.D.   On: 01/02/2016 07:13   Dg Chest Port 1 View  01/01/2016  CLINICAL DATA:  Pt c/o shortness of breath 5-7 days, worse this morning. He denies chest pain. Was recently d/c'd from nursing home after 100 day stay for "a vascular problem with swelling everywhere". He states was dx'd w/ bronchitis EXAM: PORTABLE CHEST - 1 VIEW COMPARISON:   09/15/2015 FINDINGS: Low lung volumes. Bibasilar patchy atelectasis or infiltrate. Heart size upper limits normal for technique and degree of inspiration. Previous median sternotomy. No pneumothorax. Can't exclude small pleural effusions. IMPRESSION: 1. Low lung volumes with bibasilar atelectasis or infiltrate. 2. Previous median sternotomy. Electronically Signed   By: Corlis Leak M.D.   On: 01/01/2016 11:06    Microbiology: Recent Results (from the past 240 hour(s))  Culture, blood (routine x 2)     Status: None   Collection Time: 01/01/16 11:42 AM  Result Value Ref Range Status   Specimen Description BLOOD RIGHT HAND  Final   Special Requests BOTTLES DRAWN AEROBIC AND ANAEROBIC 5CC  Final   Culture NO GROWTH 5 DAYS  Final   Report Status 01/06/2016 FINAL  Final  Culture, blood (routine x 2)     Status: None   Collection Time: 01/01/16 12:08 PM  Result Value Ref Range Status   Specimen Description BLOOD LEFT HAND  Final   Special Requests BOTTLES DRAWN AEROBIC AND ANAEROBIC 5CC  Final   Culture NO GROWTH 5 DAYS  Final   Report Status 01/06/2016 FINAL  Final  Urine culture     Status: None   Collection Time: 01/01/16  2:23 PM  Result Value Ref Range Status   Specimen Description URINE, RANDOM  Final   Special Requests NONE  Final   Culture NO GROWTH 1 DAY  Final   Report Status 01/02/2016 FINAL  Final  MRSA PCR Screening     Status: Abnormal   Collection Time: 01/02/16  4:59 AM  Result Value Ref Range Status   MRSA by PCR POSITIVE (A) NEGATIVE Final    Comment:        The GeneXpert MRSA Assay (FDA  approved for NASAL specimens only), is one component of a comprehensive MRSA colonization surveillance program. It is not intended to diagnose MRSA infection nor to guide or monitor treatment for MRSA infections. RESULT CALLED TO, READ BACK BY AND VERIFIED WITH: HAYES,C RN 0629 01/02/16 MITCHELL,L      Labs: Basic Metabolic Panel:  Recent Labs Lab 01/03/16 0531  01/05/16 0414  01/06/16 0420 01/07/16 0517 01/08/16 0448 01/09/16 0430  NA 140  < > 140 140 143 139 140  K 4.0  < > 4.1 4.0 3.9 4.0 3.7  CL 101  < > 95* 90* 91* 88* 87*  CO2 32  < > 35* 36* 39* 38* 42*  GLUCOSE 197*  < > 192* 295* 218* 299* 187*  BUN 19  < > 22* 29* 47* 48* 55*  CREATININE 1.33*  < > 1.45* 1.47* 1.51* 1.34* 1.45*  CALCIUM 8.3*  < > 8.8* 9.2 9.2 9.4 9.5  MG 2.0  --   --   --   --   --   --   PHOS 3.5  --   --   --   --   --   --   < > = values in this interval not displayed. Liver Function Tests: No results for input(s): AST, ALT, ALKPHOS, BILITOT, PROT, ALBUMIN in the last 168 hours. No results for input(s): LIPASE, AMYLASE in the last 168 hours. No results for input(s): AMMONIA in the last 168 hours. CBC:  Recent Labs Lab 01/05/16 0414 01/06/16 0420 01/07/16 0517 01/08/16 0448 01/09/16 0430  WBC 9.4 11.5* 12.2* 13.7* 14.7*  HGB 12.5* 13.1 13.0 13.2 13.6  HCT 41.5 43.0 42.8 42.8 44.3  MCV 84.7 84.8 85.4 84.6 84.5  PLT 247 306 293 293 277   Cardiac Enzymes: No results for input(s): CKTOTAL, CKMB, CKMBINDEX, TROPONINI in the last 168 hours. BNP: BNP (last 3 results)  Recent Labs  10/04/15 1044 01/01/16 1108 01/02/16 0652  BNP 13.5 196.0* 173.7*    ProBNP (last 3 results) No results for input(s): PROBNP in the last 8760 hours.  CBG:  Recent Labs Lab 01/08/16 1627 01/08/16 2026 01/08/16 2355 01/09/16 0650 01/09/16 1128  GLUCAP 467* 410* 330* 182* 262*       Signed:  Evander Macaraeg MD, PhD  [facility] 01/09/2016, 1:02 PM

## 2016-01-09 NOTE — Progress Notes (Signed)
Inpatient Diabetes Program Recommendations  AACE/ADA: New Consensus Statement on Inpatient Glycemic Control (2015)  Target Ranges:  Prepandial:   less than 140 mg/dL      Peak postprandial:   less than 180 mg/dL (1-2 hours)      Critically ill patients:  140 - 180 mg/dL   Review of Glycemic ControlResults for EAIN, LANSER (MRN [id number]) as of 01/09/2016 10:25  Ref. Range 01/08/2016 03:55 01/08/2016 06:27 01/08/2016 11:09 01/08/2016 16:27 01/08/2016 20:26 01/08/2016 23:55 01/09/2016 06:50  Glucose-Capillary Latest Ref Range: 65-99 mg/dL 888 (H) 757 (H) 972 (H) 467 (H) 410 (H) 330 (H) 182 (H)    Diabetes history: Type 2 diabetes Outpatient Diabetes medications: Levemir 60 units bid, Novolog 30-40 units tid with meals Current orders for Inpatient glycemic control:  Levemir 20 units q AM and 35 units q PM  Inpatient Diabetes Program Recommendations:   Please increase Levemir to 35 units bid.  Also please add Novolog meal coverage 15 units tid with meals (note that patient was taking Novolog 30-40 units tid with meals at home).  Thanks, Beryl Meager, RN, BC-ADM Inpatient Diabetes Coordinator Pager [phone] (8a-5p)

## 2016-01-09 NOTE — Care Management Important Message (Signed)
Important Message  Patient Details  Name: DONAVEN SPIKES MRN: [id number] Date of Birth: [birth year]   Medicare Important Message Given:  Yes    Elliot Cousin, RN 01/09/2016, 11:44 AM

## 2016-01-09 NOTE — Care Management Note (Signed)
Case Management Note  Patient Details  Name: RASHIDI NAKAYAMA MRN: [id number] Date of Birth: 1951-01-04  Subjective/Objective:       Acute respiratory failure, AKI             Action/Plan: Discharge Planning: AVS reviewed NCM spoke to pt and states his CPAP at home does not work. [facility] requested pt bring his CPAP from home. Contacted CSW for contact number for SW Coordinator at [facility]. Spoke to [employer] and they could get a loaner CPAP for pt while in the facility but will need settings. Notified attending with update. Pt does not recall when he had his last sleep study. He will need a new sleep study ordered prior to receiving a new CPAP machine.   PCP- Dr Loyal Jacobson   Expected Discharge Date:                  Expected Discharge Plan:  Skilled Nursing Facility  In-House Referral:  Clinical Social Work  Discharge planning Services  CM Consult  Post Acute Care Choice:  NA Choice offered to:  NA  DME Arranged:  N/A DME Agency:  NA  HH Arranged:  NA HH Agency:  NA  Status of Service:  Completed, signed off  Medicare Important Message Given:  Yes Date Medicare IM Given:    Medicare IM give by:    Date Additional Medicare IM Given:    Additional Medicare Important Message give by:     If discussed at Long Length of Stay Meetings, dates discussed:    Additional Comments:  Elliot Cousin, RN 01/09/2016, 12:00 PM

## 2016-01-09 NOTE — Progress Notes (Signed)
Discussed managing HF with pt. Reviewed HF booklet, diet, daily wts. Voiced understanding and seems to have good comprehension. His wives had apparently made him low sodium frozen meals PTA. He sts he is in process of getting a scale for home that he can balance on. Encouraged as much activity as possible with SNF staff. N/a for CRPII unfortunately by Medicare guidelines. 1013-1100

## 2016-01-09 NOTE — Progress Notes (Signed)
1546 Report given to nurse in [place]

## 2016-01-09 NOTE — Progress Notes (Signed)
Patient has accepted bed offer at [facility]. Facility has been informed of patient and family's selection. Per facility representative Wille Celeste, patient can arrive to facility on today. CSW to inform patient and family of transfer time when available. Patient and family appreciative of CSW services.   Patient to be transported via [facility] to [facility] on today. D/C Summary to be sent via Hub system when available. No further needs were requested at this time. CSW to sign off.   Please re-consult if further CSW needs arise.   Fernande Boyden, LCSWA Clinical Social Worker [facility] Ph: [phone]

## 2016-01-09 NOTE — Progress Notes (Signed)
NCM notified CSW that dc summary was updated with info on CPAP setting and that outpatient sleep study will need to be arranged. Isidoro Donning RN CCM Case Mgmt phone [phone]

## 2016-01-09 NOTE — Progress Notes (Signed)
1527 Transferred to [facility] nursing [facility] . All belongings checked with the pt Pt in no distress

## 2016-01-09 NOTE — Clinical Social Work Placement (Signed)
   CLINICAL SOCIAL WORK PLACEMENT  NOTE  Date:  01/09/2016  Patient Details  Name: Antonio [name] MRN: [id number] Date of Birth: 09-26-1951  Clinical Social Work is seeking post-discharge placement for this patient at the Skilled  Nursing Facility level of care (*CSW will initial, date and re-position this form in  chart as items are completed):  Yes   Patient/family provided with [facility] Clinical Social Work Department's list of facilities offering this level of care within the geographic area requested by the patient (or if unable, by the patient's family).  Yes   Patient/family informed of their freedom to choose among providers that offer the needed level of care, that participate in Medicare, Medicaid or managed care program needed by the patient, have an available bed and are willing to accept the patient.      Patient/family informed of [facility]'s ownership interest in [facility] and [facility], as well as of the fact that they are under no obligation to receive care at these facilities.  PASRR submitted to EDS on 01/06/16     PASRR number received on       Existing PASRR number confirmed on 01/10/16     FL2 transmitted to all facilities in geographic area requested by pt/family on       FL2 transmitted to all facilities within larger geographic area on 01/06/16     Patient informed that his/her managed care company has contracts with or will negotiate with certain facilities, including the following:        Yes   Patient/family informed of bed offers received.  Patient chooses bed at  [facility] )     Physician recommends and patient chooses bed at      Patient to be transferred to  ([facility]) on 01/09/16.  Patient to be transferred to facility by  [facility])     Patient family notified on 01/09/16 of transfer.  Name of family member notified:  Patiennt is aware.      PHYSICIAN Please prepare priority discharge summary, including medications      Additional Comment:    _______________________________________________ Loleta Dicker, LCSW 01/09/2016, 10:31 AM

## 2016-01-09 NOTE — Progress Notes (Signed)
Pt requested CPAP be taken off, pt placed on 3Lnc per pt request

## 2016-01-20 ENCOUNTER — Ambulatory Visit (HOSPITAL_COMMUNITY)
Admission: RE | Admit: 2016-01-20 | Discharge: 2016-01-20 | Disposition: A | Discharge status: Home / Self Care | Payer: Medicare Other | Source: Ambulatory Visit | Attending: Cardiology | Admitting: Cardiology

## 2016-01-20 ENCOUNTER — Encounter (HOSPITAL_COMMUNITY): Payer: Self-pay

## 2016-01-20 VITALS — BP 124/74 | HR 93 | Resp 22 | Wt 367.2 lb

## 2016-01-20 DIAGNOSIS — Z8249 Family history of ischemic heart disease and other diseases of the circulatory system: Secondary | ICD-10-CM | POA: Insufficient documentation

## 2016-01-20 DIAGNOSIS — E1122 Type 2 diabetes mellitus with diabetic chronic kidney disease: Secondary | ICD-10-CM | POA: Diagnosis not present

## 2016-01-20 DIAGNOSIS — G4733 Obstructive sleep apnea (adult) (pediatric): Secondary | ICD-10-CM | POA: Diagnosis not present

## 2016-01-20 DIAGNOSIS — I5032 Chronic diastolic (congestive) heart failure: Secondary | ICD-10-CM | POA: Insufficient documentation

## 2016-01-20 DIAGNOSIS — Z7982 Long term (current) use of aspirin: Secondary | ICD-10-CM | POA: Diagnosis not present

## 2016-01-20 DIAGNOSIS — S81801D Unspecified open wound, right lower leg, subsequent encounter: Secondary | ICD-10-CM

## 2016-01-20 DIAGNOSIS — E039 Hypothyroidism, unspecified: Secondary | ICD-10-CM | POA: Insufficient documentation

## 2016-01-20 DIAGNOSIS — Z79899 Other long term (current) drug therapy: Secondary | ICD-10-CM | POA: Diagnosis not present

## 2016-01-20 DIAGNOSIS — Z953 Presence of xenogenic heart valve: Secondary | ICD-10-CM | POA: Insufficient documentation

## 2016-01-20 DIAGNOSIS — M109 Gout, unspecified: Secondary | ICD-10-CM | POA: Diagnosis not present

## 2016-01-20 DIAGNOSIS — E662 Morbid (severe) obesity with alveolar hypoventilation: Secondary | ICD-10-CM | POA: Diagnosis not present

## 2016-01-20 DIAGNOSIS — N183 Chronic kidney disease, stage 3 unspecified: Secondary | ICD-10-CM | POA: Insufficient documentation

## 2016-01-20 DIAGNOSIS — I447 Left bundle-branch block, unspecified: Secondary | ICD-10-CM | POA: Diagnosis not present

## 2016-01-20 DIAGNOSIS — Z794 Long term (current) use of insulin: Secondary | ICD-10-CM | POA: Insufficient documentation

## 2016-01-20 DIAGNOSIS — Z9981 Dependence on supplemental oxygen: Secondary | ICD-10-CM | POA: Insufficient documentation

## 2016-01-20 DIAGNOSIS — Z9989 Dependence on other enabling machines and devices: Secondary | ICD-10-CM

## 2016-01-20 LAB — BASIC METABOLIC PANEL
Anion gap: 16 — ABNORMAL HIGH (ref 5–15)
BUN: 49 mg/dL — AB (ref 6–20)
CHLORIDE: 92 mmol/L — AB (ref 101–111)
CO2: 28 mmol/L (ref 22–32)
CREATININE: 1.59 mg/dL — AB (ref 0.61–1.24)
Calcium: 9.1 mg/dL (ref 8.9–10.3)
GFR, EST AFRICAN AMERICAN: 51 mL/min — AB (ref >60)
GFR, EST NON AFRICAN AMERICAN: 44 mL/min — AB (ref >60)
Glucose, Bld: 300 mg/dL — ABNORMAL HIGH (ref 65–99)
POTASSIUM: 4.2 mmol/L (ref 3.5–5.1)
SODIUM: 136 mmol/L (ref 135–145)

## 2016-01-20 NOTE — Progress Notes (Addendum)
Patient ID: Antonio [name], male   DOB: 04-12-1951, 65 y.o.   MRN: [id number]    Advanced Heart Failure Clinic Note   PCP: Dr Leonette Most [facility]) HF Cardiology: Dr. Shirlee Latch  65 yo with bioprosthetic AVR, chronic diastolic CHF, CKD and morbid obesity presents for CHF clinic followup.  Last echo in 10/16 showed normal bioprosthetic aortic valve and normal EF.  He was  admitted in 10/16 with volume overload and diuresed.  He was sent home on torsemide 80 qam/40 qpm.  He sees a nephrologist in [facility].   He was admitted again in 11/16 with acute on chronic diastolic CHF and inability to do ADLs at home.  He was diuresed and discharged to a rehab facility.    Admitted 01/01/16 - 01/09/2016 with hypercapneic respiratory failure 2/2 to CPAP non-compliance due to poor tolerance and ? HCAP. Treated with BiPAP and ABX. Volume thought to be stable. Creatinine better than baseline that admission and discharge weight 361 lbs.   Today he returns for post hospital follow up. Is at home. Was only in facility for 3 days, didn't have any more days.  Sees HHRN and PT twice a week and is progressing. Is on 02 at home and is compliant with CPAP. Weight at home yesterday 369 at home after breakfast and lunch and dinner. He is trying to do a low salt diet, but has trouble at times.  Had 3 scrambled eggs with cheddar cheese and a pork tenderloin for breakfast this morning. (Has his food delivered, take out). Is watching his fluid intake. Breathing much better now on 02, Does still have some DOE with PT. No CP. No lightheadedness or dizziness. Sleeps at a slight incline.   RHC 10/14/15 --> Torsemide was increased to 100 mg in am and 80 mg daily.  RA mean 13 RV 37/13 PA 36/14, mean 25 PCWP mean 21 Oxygen saturations: PA 65% AO 98% Cardiac Output (Fick) 6.26  Cardiac Index (Fick) 2.21  Labs (10/16): K 3.7, creatinine 1.76, hgb 11.7  Labs (11/16): K 3.6, creatinine 1.68 Labs (12/16): K 4.5, creatinine 2.28  PMH: 1.  Severe aortic stenosis: s/p bioprosthetic AVR in 1/13.  2. Chronic diastolic CHF: Echo (10/16) with EF 55-60%, bioprosthetic AVR with mean gradient 13 mmHg, normal RV size and systolic function.   3. OSA: Not on CPAP.  4. Type II diabetes 5. Gout  6. CKD 7. Subclinical hypothyroidism 8. LHC (1/12) with normal coronaries. 9. Chronic LBBB 10. Morbid obesity  Family History  Problem Relation Age of Onset  . Heart attack Father   . Cancer Mother    Social History   Social History  . Marital Status: Divorced    Spouse Name: N/A  . Number of Children: N/A  . Years of Education: N/A   Occupational History  . retired travelling Engineer, site    Social History Main Topics  . Smoking status: Never Smoker   . Smokeless tobacco: Never Used  . Alcohol Use: Yes     Comment: 02/18/2015 "once or twice/yr I'll have a cocktail"      rare  . Drug Use: Yes    Special: Other-see comments  . Sexual Activity: Not Currently   Other Topics Concern  . None   Social History Narrative   ROS: All systems reviewed and negative except as per HPI  Current Outpatient Prescriptions  Medication Sig Dispense Refill  . aspirin 81 MG tablet Take 81 mg by mouth daily.    Marland Kitchen  atorvastatin (LIPITOR) 40 MG tablet Take 40 mg by mouth daily at 6 PM.     . colchicine 0.6 MG tablet Take 0.6 mg by mouth daily.     . febuxostat (ULORIC) 40 MG tablet Take 40 mg by mouth daily.     . fenofibrate 54 MG tablet Take 54 mg by mouth daily.  0  . fluticasone (FLONASE) 50 MCG/ACT nasal spray Place 1 spray into both nostrils daily. 16 g 2  . hydrocerin (EUCERIN) CREA Apply 1 application topically 2 (two) times daily. 113 g 0  . HYDROcodone-acetaminophen (NORCO) 7.5-325 MG tablet Take 1 tablet by mouth every 4 (four) hours as needed for moderate pain. 10 tablet 0  . hydrOXYzine (ATARAX/VISTARIL) 25 MG tablet Take 25 mg by mouth every 6 (six) hours as needed for itching.    . insulin detemir (LEVEMIR) 100 UNIT/ML injection  25units qam and 30 units qhs (Patient taking differently: Inject 30-35 Units into the skin 2 (two) times daily. 25units qam and 30 units qhs) 10 mL 11  . lactobacillus acidophilus (BACID) TABS tablet Take 2 tablets by mouth 2 (two) times daily.    Marland Kitchen lidocaine (LINDAMANTLE) 3 % CREA cream Apply 1 application topically 3 (three) times daily as needed (rash).    . NOVOLOG 100 UNIT/ML injection Inject 30-40 Units into the skin 3 (three) times daily with meals. Sliding scale  0  . sodium chloride (OCEAN) 0.65 % SOLN nasal spray Place 1 spray into both nostrils as needed for congestion. 1 Bottle 0  . torsemide (DEMADEX) 20 MG tablet Take 4 tablets (80 mg total) by mouth 2 (two) times daily. 240 tablet 0  . potassium chloride SA (K-DUR,KLOR-CON) 20 MEQ tablet Take 2 tablets (40 mEq total) by mouth daily. Take 3 tabs in AM and 2 tabs in PM 130 tablet 6   No current facility-administered medications for this encounter.   BP 124/74 mmHg  Pulse 93  Resp 22  Wt 367 lb 4 oz (166.584 kg)  SpO2 90% General: NAD in wheelchair.  Neck: Thick, JVP difficult but does not appear elevated. no thyromegaly or thyroid nodule.  Lungs: CTAB, normal effort.  CV: Nondisplaced PMI.  Heart regular S1/S2, no S3/S4, 1/6 SEM RUSB.  1+ edema to knees bilaterally.  No carotid bruit.  Normal pedal pulses.  Abdomen: Morbidly obese, soft, NT, ND, no HSM. No bruits or masses. +BS  Skin: Intact without lesions or rashes.  Neurologic: Alert and oriented x 3.  Psych: Normal affect. Extremities: No clubbing or cyanosis. BLE trace - 1+ edema.  RLE with significant venous stasis changes.  HEENT: Normal.   Assessment/Plan: 1. Chronic diastolic CHF: Most recent ECHO 08/11/2015 EF 55-60%  NYHA IIIB. Had RHC on 10/14/15 with elevated filling pressures. - Continue torsemide 80 mg twice a day. He is unsure how exactly he is taking potassium. Is getting at least 40 meq in pm. Unsure about am dose.  BMET today and will address accordingly.    - Continue low salt diet and limiting fluid intake to < 2 liters per day.  2. CKD: Stage III.- Stable during recent admission. Recheck BMET today.    3. Bioprosthetic aortic valve: Stable on last echo.  4. RLE wound: HHRN following.  5. Immunizations- Has had flu shot 2016 and pneumococcal vaccine in 2016.  6. Morbid Obesity: Discussed portion control.  7. OSA:  - Now on chronic 02 and wearing CPAP nightly.  Says this has helped a lot 8. DM -  To see Dr Allena Katz with endocrinology next week.   Follow up 6-8 weeks with Dr Shirlee Latch. BMET today.  Will follow up with him about how he is taking potassium based on BMET.   Mariam Dollar [place] PA-C 01/20/2016  Total time spent: 30 minutes, over half of that discussing the above.

## 2016-01-20 NOTE — Progress Notes (Signed)
Advanced Heart Failure Medication Review by a Pharmacist  Does the patient  feel that his/her medications are working for him/her?  yes  Has the patient been experiencing any side effects to the medications prescribed?  no  Does the patient measure his/her own blood pressure or blood glucose at home?  no   Does the patient have any problems obtaining medications due to transportation or finances?   no  Understanding of regimen: fair Understanding of indications: fair Potential of compliance: fair Patient understands to avoid NSAIDs. Patient understands to avoid decongestants.  Issues to address at subsequent visits: Adherence   Pharmacist comments: 65 YO pleasant male presenting to HF clinic without his medication list or prescription bottles.  Pt states he takes two 20 meq KCl in the evening but does not remember how many he takes in the AM but believes it is one 20 meq tablet.  Pt has questionable adherence but was able to state how he takes most of his medications. He denies SE or problems obtaining his meds.     Time with patient: 10 min  Preparation and documentation time: 5 min  Total time: 15 min

## 2016-01-20 NOTE — Patient Instructions (Signed)
Labs today  Your physician recommends that you schedule a follow-up appointment in: 6-8 weeks   

## 2016-02-08 ENCOUNTER — Encounter (HOSPITAL_COMMUNITY): Payer: Self-pay | Admitting: Emergency Medicine

## 2016-02-08 ENCOUNTER — Inpatient Hospital Stay (HOSPITAL_COMMUNITY)
Admission: EM | Admit: 2016-02-08 | Discharge: 2016-02-16 | DRG: 871 | Disposition: A | Discharge status: Home Health | Payer: Medicare Other | Attending: Internal Medicine | Admitting: Internal Medicine

## 2016-02-08 ENCOUNTER — Emergency Department (HOSPITAL_COMMUNITY): Payer: Medicare Other

## 2016-02-08 DIAGNOSIS — I959 Hypotension, unspecified: Secondary | ICD-10-CM

## 2016-02-08 DIAGNOSIS — N183 Chronic kidney disease, stage 3 unspecified: Secondary | ICD-10-CM | POA: Diagnosis present

## 2016-02-08 DIAGNOSIS — Z8614 Personal history of Methicillin resistant Staphylococcus aureus infection: Secondary | ICD-10-CM

## 2016-02-08 DIAGNOSIS — E119 Type 2 diabetes mellitus without complications: Secondary | ICD-10-CM

## 2016-02-08 DIAGNOSIS — Z6841 Body Mass Index (BMI) 40.0 and over, adult: Secondary | ICD-10-CM

## 2016-02-08 DIAGNOSIS — K5900 Constipation, unspecified: Secondary | ICD-10-CM | POA: Diagnosis not present

## 2016-02-08 DIAGNOSIS — N189 Chronic kidney disease, unspecified: Secondary | ICD-10-CM | POA: Diagnosis present

## 2016-02-08 DIAGNOSIS — Z23 Encounter for immunization: Secondary | ICD-10-CM

## 2016-02-08 DIAGNOSIS — N179 Acute kidney failure, unspecified: Secondary | ICD-10-CM | POA: Diagnosis present

## 2016-02-08 DIAGNOSIS — Z953 Presence of xenogenic heart valve: Secondary | ICD-10-CM

## 2016-02-08 DIAGNOSIS — Z7982 Long term (current) use of aspirin: Secondary | ICD-10-CM

## 2016-02-08 DIAGNOSIS — I480 Paroxysmal atrial fibrillation: Secondary | ICD-10-CM | POA: Diagnosis present

## 2016-02-08 DIAGNOSIS — E785 Hyperlipidemia, unspecified: Secondary | ICD-10-CM | POA: Diagnosis present

## 2016-02-08 DIAGNOSIS — Z9111 Patient's noncompliance with dietary regimen: Secondary | ICD-10-CM

## 2016-02-08 DIAGNOSIS — I350 Nonrheumatic aortic (valve) stenosis: Secondary | ICD-10-CM

## 2016-02-08 DIAGNOSIS — Z9989 Dependence on other enabling machines and devices: Secondary | ICD-10-CM

## 2016-02-08 DIAGNOSIS — Z7951 Long term (current) use of inhaled steroids: Secondary | ICD-10-CM

## 2016-02-08 DIAGNOSIS — I2510 Atherosclerotic heart disease of native coronary artery without angina pectoris: Secondary | ICD-10-CM | POA: Diagnosis present

## 2016-02-08 DIAGNOSIS — I5032 Chronic diastolic (congestive) heart failure: Secondary | ICD-10-CM | POA: Diagnosis not present

## 2016-02-08 DIAGNOSIS — Z794 Long term (current) use of insulin: Secondary | ICD-10-CM

## 2016-02-08 DIAGNOSIS — R230 Cyanosis: Secondary | ICD-10-CM

## 2016-02-08 DIAGNOSIS — I5033 Acute on chronic diastolic (congestive) heart failure: Secondary | ICD-10-CM | POA: Insufficient documentation

## 2016-02-08 DIAGNOSIS — E872 Acidosis, unspecified: Secondary | ICD-10-CM

## 2016-02-08 DIAGNOSIS — Z79899 Other long term (current) drug therapy: Secondary | ICD-10-CM

## 2016-02-08 DIAGNOSIS — A419 Sepsis, unspecified organism: Secondary | ICD-10-CM | POA: Diagnosis not present

## 2016-02-08 DIAGNOSIS — I130 Hypertensive heart and chronic kidney disease with heart failure and stage 1 through stage 4 chronic kidney disease, or unspecified chronic kidney disease: Secondary | ICD-10-CM | POA: Diagnosis present

## 2016-02-08 DIAGNOSIS — M7989 Other specified soft tissue disorders: Secondary | ICD-10-CM

## 2016-02-08 DIAGNOSIS — E860 Dehydration: Secondary | ICD-10-CM | POA: Diagnosis present

## 2016-02-08 DIAGNOSIS — A084 Viral intestinal infection, unspecified: Secondary | ICD-10-CM | POA: Diagnosis present

## 2016-02-08 DIAGNOSIS — M109 Gout, unspecified: Secondary | ICD-10-CM | POA: Diagnosis present

## 2016-02-08 DIAGNOSIS — G4733 Obstructive sleep apnea (adult) (pediatric): Secondary | ICD-10-CM | POA: Diagnosis present

## 2016-02-08 DIAGNOSIS — IMO0001 Reserved for inherently not codable concepts without codable children: Secondary | ICD-10-CM

## 2016-02-08 DIAGNOSIS — R197 Diarrhea, unspecified: Secondary | ICD-10-CM | POA: Diagnosis present

## 2016-02-08 DIAGNOSIS — N170 Acute kidney failure with tubular necrosis: Secondary | ICD-10-CM | POA: Diagnosis not present

## 2016-02-08 DIAGNOSIS — E662 Morbid (severe) obesity with alveolar hypoventilation: Secondary | ICD-10-CM | POA: Diagnosis present

## 2016-02-08 DIAGNOSIS — E1122 Type 2 diabetes mellitus with diabetic chronic kidney disease: Secondary | ICD-10-CM | POA: Diagnosis present

## 2016-02-08 DIAGNOSIS — H5441 Blindness, right eye, normal vision left eye: Secondary | ICD-10-CM | POA: Diagnosis present

## 2016-02-08 LAB — URINALYSIS, ROUTINE W REFLEX MICROSCOPIC
Bilirubin Urine: NEGATIVE
Glucose, UA: NEGATIVE mg/dL
Hgb urine dipstick: NEGATIVE
KETONES UR: NEGATIVE mg/dL
LEUKOCYTES UA: NEGATIVE
NITRITE: NEGATIVE
PH: 5 (ref 5.0–8.0)
PROTEIN: NEGATIVE mg/dL
Specific Gravity, Urine: 1.01 (ref 1.005–1.030)

## 2016-02-08 LAB — CBC WITH DIFFERENTIAL/PLATELET
BASOS ABS: 0 10*3/uL (ref 0.0–0.1)
BASOS PCT: 0 %
EOS PCT: 2 %
Eosinophils Absolute: 0.3 10*3/uL (ref 0.0–0.7)
HEMATOCRIT: 44 % (ref 39.0–52.0)
Hemoglobin: 13.5 g/dL (ref 13.0–17.0)
Lymphocytes Relative: 34 %
Lymphs Abs: 4.7 10*3/uL — ABNORMAL HIGH (ref 0.7–4.0)
MCH: 26.9 pg (ref 26.0–34.0)
MCHC: 30.7 g/dL (ref 30.0–36.0)
MCV: 87.8 fL (ref 78.0–100.0)
MONO ABS: 0.8 10*3/uL (ref 0.1–1.0)
Monocytes Relative: 6 %
Neutro Abs: 7.9 10*3/uL — ABNORMAL HIGH (ref 1.7–7.7)
Neutrophils Relative %: 58 %
PLATELETS: 288 10*3/uL (ref 150–400)
RBC: 5.01 MIL/uL (ref 4.22–5.81)
RDW: 15.8 % — AB (ref 11.5–15.5)
WBC: 13.7 10*3/uL — ABNORMAL HIGH (ref 4.0–10.5)

## 2016-02-08 LAB — COMPREHENSIVE METABOLIC PANEL
ALBUMIN: 3.6 g/dL (ref 3.5–5.0)
ALK PHOS: 72 U/L (ref 38–126)
ALT: 16 U/L — ABNORMAL LOW (ref 17–63)
AST: 22 U/L (ref 15–41)
Anion gap: 14 (ref 5–15)
BILIRUBIN TOTAL: 0.5 mg/dL (ref 0.3–1.2)
BUN: 55 mg/dL — AB (ref 6–20)
CALCIUM: 9.4 mg/dL (ref 8.9–10.3)
CO2: 34 mmol/L — ABNORMAL HIGH (ref 22–32)
Chloride: 94 mmol/L — ABNORMAL LOW (ref 101–111)
Creatinine, Ser: 2.28 mg/dL — ABNORMAL HIGH (ref 0.61–1.24)
GFR calc Af Amer: 33 mL/min — ABNORMAL LOW (ref >60)
GFR calc non Af Amer: 28 mL/min — ABNORMAL LOW (ref >60)
GLUCOSE: 88 mg/dL (ref 65–99)
Potassium: 4.6 mmol/L (ref 3.5–5.1)
Sodium: 142 mmol/L (ref 135–145)
TOTAL PROTEIN: 7.1 g/dL (ref 6.5–8.1)

## 2016-02-08 LAB — I-STAT CG4 LACTIC ACID, ED
LACTIC ACID, VENOUS: 2.01 mmol/L — AB (ref 0.5–2.0)
Lactic Acid, Venous: 1.98 mmol/L (ref 0.5–2.0)
Lactic Acid, Venous: 0.64 mmol/L (ref 0.5–2.0)

## 2016-02-08 LAB — BRAIN NATRIURETIC PEPTIDE: B Natriuretic Peptide: 29.9 pg/mL (ref 0.0–100.0)

## 2016-02-08 LAB — CBG MONITORING, ED: Glucose-Capillary: 93 mg/dL (ref 65–99)

## 2016-02-08 LAB — GLUCOSE, CAPILLARY: Glucose-Capillary: 254 mg/dL — ABNORMAL HIGH (ref 65–99)

## 2016-02-08 MED ORDER — INSULIN DETEMIR 100 UNIT/ML ~~LOC~~ SOLN
30.0000 [IU] | Freq: Every day | SUBCUTANEOUS | Status: DC
  Administered 2016-02-09 – 2016-02-16 (×8): 30 [IU] via SUBCUTANEOUS
  Filled 2016-02-08 (×8): qty 0.3

## 2016-02-08 MED ORDER — SODIUM CHLORIDE 0.9 % IV BOLUS (SEPSIS): 1000.0000 mL | Freq: Once | INTRAVENOUS | Status: DC

## 2016-02-08 MED ORDER — SODIUM CHLORIDE 0.9 % IV BOLUS (SEPSIS)
1000.0000 mL | Freq: Once | INTRAVENOUS | Status: AC
  Administered 2016-02-08: 1000 mL via INTRAVENOUS

## 2016-02-08 MED ORDER — ACETAMINOPHEN 650 MG RE SUPP: 650.0000 mg | Freq: Four times a day (QID) | RECTAL | Status: DC | PRN

## 2016-02-08 MED ORDER — FEBUXOSTAT 40 MG PO TABS
40.0000 mg | ORAL_TABLET | Freq: Every day | ORAL | Status: DC
  Administered 2016-02-09 – 2016-02-16 (×8): 40 mg via ORAL
  Filled 2016-02-08 (×9): qty 1

## 2016-02-08 MED ORDER — ONDANSETRON HCL 4 MG PO TABS
4.0000 mg | ORAL_TABLET | Freq: Four times a day (QID) | ORAL | Status: DC | PRN
Start: 2016-02-08 — End: 2016-02-17

## 2016-02-08 MED ORDER — VANCOMYCIN HCL IN DEXTROSE 1-5 GM/200ML-% IV SOLN
1000.0000 mg | Freq: Once | INTRAVENOUS | Status: AC
  Administered 2016-02-08: 1000 mg via INTRAVENOUS
  Filled 2016-02-08: qty 200

## 2016-02-08 MED ORDER — VANCOMYCIN HCL 10 G IV SOLR
1500.0000 mg | Freq: Once | INTRAVENOUS | Status: AC
  Administered 2016-02-08: 1500 mg via INTRAVENOUS
  Filled 2016-02-08: qty 1500

## 2016-02-08 MED ORDER — FENOFIBRATE 54 MG PO TABS
54.0000 mg | ORAL_TABLET | Freq: Every day | ORAL | Status: DC
  Administered 2016-02-09 – 2016-02-16 (×8): 54 mg via ORAL
  Filled 2016-02-08 (×8): qty 1

## 2016-02-08 MED ORDER — SODIUM CHLORIDE 0.9% FLUSH
3.0000 mL | Freq: Two times a day (BID) | INTRAVENOUS | Status: DC
Start: 2016-02-08 — End: 2016-02-17
  Administered 2016-02-09 – 2016-02-16 (×15): 3 mL via INTRAVENOUS

## 2016-02-08 MED ORDER — INSULIN ASPART 100 UNIT/ML ~~LOC~~ SOLN
0.0000 [IU] | SUBCUTANEOUS | Status: DC
  Administered 2016-02-09: 5 [IU] via SUBCUTANEOUS
  Administered 2016-02-09: 8 [IU] via SUBCUTANEOUS
  Administered 2016-02-09: 5 [IU] via SUBCUTANEOUS
  Administered 2016-02-09: 8 [IU] via SUBCUTANEOUS
  Administered 2016-02-09: 5 [IU] via SUBCUTANEOUS
  Administered 2016-02-10 (×2): 3 [IU] via SUBCUTANEOUS
  Administered 2016-02-10: 5 [IU] via SUBCUTANEOUS
  Administered 2016-02-10: 3 [IU] via SUBCUTANEOUS
  Administered 2016-02-10 (×2): 5 [IU] via SUBCUTANEOUS
  Administered 2016-02-11: 2 [IU] via SUBCUTANEOUS
  Administered 2016-02-11: 3 [IU] via SUBCUTANEOUS
  Administered 2016-02-11: 2 [IU] via SUBCUTANEOUS
  Administered 2016-02-11: 8 [IU] via SUBCUTANEOUS
  Administered 2016-02-11 – 2016-02-12 (×2): 2 [IU] via SUBCUTANEOUS
  Administered 2016-02-12: 5 [IU] via SUBCUTANEOUS
  Administered 2016-02-12: 11 [IU] via SUBCUTANEOUS
  Administered 2016-02-12: 5 [IU] via SUBCUTANEOUS
  Administered 2016-02-13: 2 [IU] via SUBCUTANEOUS
  Administered 2016-02-13: 3 [IU] via SUBCUTANEOUS
  Administered 2016-02-13: 5 [IU] via SUBCUTANEOUS
  Administered 2016-02-13: 2 [IU] via SUBCUTANEOUS
  Administered 2016-02-13 (×2): 5 [IU] via SUBCUTANEOUS
  Administered 2016-02-13 – 2016-02-14 (×2): 3 [IU] via SUBCUTANEOUS
  Administered 2016-02-14 (×3): 5 [IU] via SUBCUTANEOUS
  Administered 2016-02-14: 8 [IU] via SUBCUTANEOUS
  Administered 2016-02-14 – 2016-02-15 (×2): 5 [IU] via SUBCUTANEOUS
  Administered 2016-02-15: 8 [IU] via SUBCUTANEOUS
  Administered 2016-02-15: 2 [IU] via SUBCUTANEOUS
  Administered 2016-02-15 (×2): 5 [IU] via SUBCUTANEOUS
  Administered 2016-02-15: 8 [IU] via SUBCUTANEOUS
  Administered 2016-02-16: 11 [IU] via SUBCUTANEOUS
  Administered 2016-02-16: 3 [IU] via SUBCUTANEOUS
  Administered 2016-02-16: 5 [IU] via SUBCUTANEOUS
  Administered 2016-02-16: 15 [IU] via SUBCUTANEOUS

## 2016-02-08 MED ORDER — ENOXAPARIN SODIUM 40 MG/0.4ML ~~LOC~~ SOLN: 40.0000 mg | SUBCUTANEOUS | Status: DC

## 2016-02-08 MED ORDER — VANCOMYCIN HCL 10 G IV SOLR
1750.0000 mg | INTRAVENOUS | Status: DC
  Administered 2016-02-09 – 2016-02-10 (×2): 1750 mg via INTRAVENOUS
  Filled 2016-02-08 (×2): qty 1750

## 2016-02-08 MED ORDER — VANCOMYCIN HCL IN DEXTROSE 1-5 GM/200ML-% IV SOLN: 1000.0000 mg | Freq: Once | INTRAVENOUS | Status: DC

## 2016-02-08 MED ORDER — ALBUTEROL SULFATE (2.5 MG/3ML) 0.083% IN NEBU: 2.5000 mg | INHALATION_SOLUTION | RESPIRATORY_TRACT | Status: DC | PRN

## 2016-02-08 MED ORDER — HYDROCODONE-ACETAMINOPHEN 7.5-325 MG PO TABS
1.0000 | ORAL_TABLET | ORAL | Status: DC | PRN
  Administered 2016-02-10 – 2016-02-16 (×19): 1 via ORAL
  Filled 2016-02-08 (×19): qty 1

## 2016-02-08 MED ORDER — ONDANSETRON HCL 4 MG/2ML IJ SOLN: 4.0000 mg | Freq: Four times a day (QID) | INTRAMUSCULAR | Status: DC | PRN

## 2016-02-08 MED ORDER — ACETAMINOPHEN 325 MG PO TABS
650.0000 mg | ORAL_TABLET | Freq: Four times a day (QID) | ORAL | Status: DC | PRN
  Administered 2016-02-10: 650 mg via ORAL
  Filled 2016-02-08 (×2): qty 2

## 2016-02-08 MED ORDER — PIPERACILLIN-TAZOBACTAM 3.375 G IVPB
3.3750 g | Freq: Three times a day (TID) | INTRAVENOUS | Status: DC
  Administered 2016-02-09 – 2016-02-10 (×6): 3.375 g via INTRAVENOUS
  Filled 2016-02-08 (×9): qty 50

## 2016-02-08 MED ORDER — COLCHICINE 0.6 MG PO TABS
0.6000 mg | ORAL_TABLET | Freq: Every day | ORAL | Status: DC
  Administered 2016-02-09 – 2016-02-16 (×8): 0.6 mg via ORAL
  Filled 2016-02-08 (×8): qty 1

## 2016-02-08 MED ORDER — ASPIRIN EC 81 MG PO TBEC
81.0000 mg | DELAYED_RELEASE_TABLET | Freq: Every day | ORAL | Status: DC
  Administered 2016-02-09 – 2016-02-16 (×8): 81 mg via ORAL
  Filled 2016-02-08 (×8): qty 1

## 2016-02-08 MED ORDER — INSULIN DETEMIR 100 UNIT/ML ~~LOC~~ SOLN
35.0000 [IU] | Freq: Every day | SUBCUTANEOUS | Status: DC
  Administered 2016-02-09 – 2016-02-15 (×8): 35 [IU] via SUBCUTANEOUS
  Filled 2016-02-08 (×9): qty 0.35

## 2016-02-08 MED ORDER — ATORVASTATIN CALCIUM 40 MG PO TABS
40.0000 mg | ORAL_TABLET | Freq: Every day | ORAL | Status: DC
  Administered 2016-02-09 – 2016-02-16 (×8): 40 mg via ORAL
  Filled 2016-02-08 (×8): qty 1

## 2016-02-08 MED ORDER — PIPERACILLIN-TAZOBACTAM 3.375 G IVPB 30 MIN
3.3750 g | Freq: Once | INTRAVENOUS | Status: AC
  Administered 2016-02-08: 3.375 g via INTRAVENOUS
  Filled 2016-02-08: qty 50

## 2016-02-08 NOTE — ED Notes (Signed)
pts iv alarming   Air in line    fixed

## 2016-02-08 NOTE — ED Notes (Signed)
Pt attempting to give urine sample at this time.

## 2016-02-08 NOTE — ED Notes (Signed)
Admitting MD at bedside.

## 2016-02-08 NOTE — ED Provider Notes (Signed)
CSN: [id number]     Arrival date & time 02/08/16  1327 History   First MD Initiated Contact with Patient 02/08/16 1331     Chief Complaint  Patient presents with  . Hypotension     (Consider location/radiation/quality/duration/timing/severity/associated sxs/prior Treatment) 65 year old male with a history of diabetes, obstructive sleep apnea on CPAP, hypertension, hyperlipidemia, chronic diastolic heart failure on 4L of O2 at home, aortic valve replacement 2013, history of MRSA abscess, CKD, admission for acute hypoxic respiratory failure and hypotension in March, presents with concern for hypotension discovered while he was at his outpatient nephrology appointment. (BP there 702/50s)    Patient reports he had an episode of near syncope while in the waiting room, and reports 2-3 loose stool per day over the last 2 weeks, however denies any other new infectious symptoms, denies any new medications, or recent increase in medications, and reports he has been taking the same dose of torsemide 80 mg twice a day for months.  Patient reports that he feels back to normal now, was able to stand both for exam and an transfer to the ED stretcher.  Reports feeling a bit off for days, however lightheadedness began today and was temporary.  Noticed area of of swelling of the buttock, at gluteal cleft, not painful like prior abscesses  Scrotal itching for which he was treated for yeast infxn.  HPI Comments: Dizziness starting today in waiting room, waiting to see nephrologist Found low blood pressures Near-syncope, no syncope Now feeling better Increased torsemide months ago No other changes in medications [phone]    Past Medical History  Diagnosis Date  . Blindness of right eye     a. due to amblyopia as child  . Hx MRSA infection     a. thigh abcess 2006  . Anxiety   . Aortic stenosis     a. s/p AVR 11/29/11  . HTN (hypertension)   . HLD (hyperlipidemia)   . Gout   . Chronic diastolic  heart failure (HCC)     a. Echo 08/11/2015 LVEF 55-60% with normal RV function  . DM2 (diabetes mellitus, type 2) (HCC)   . OSA on CPAP   . Hepatitis   . Arthritis   . History of acute renal failure    Past Surgical History  Procedure Laterality Date  . Lumbar laminectomy  10/30/1983  . Aortic valve replacement  11/29/2011    Procedure: AORTIC VALVE REPLACEMENT (AVR);  Surgeon: [name], MD;  Location: [name] OR;  Service: Open Heart Surgery;  Laterality: N/A;  with nitric oxide  . Right heart catheterization N/A 11/28/2011    Procedure: RIGHT HEART CATH;  Surgeon: [name], MD;  Location: [facility] CATH LAB;  Service: Cardiovascular;  Laterality: N/A;  . Cardiac valve replacement    . Incision and drainage abscess Left 08/2005    "upper/inner thigh; that's where the MRSA was"  . Back surgery    . Cardiac catheterization    . Cardiac catheterization N/A 10/14/2015    Procedure: Right Heart Cath;  Surgeon: [name], MD;  Location: [facility] INVASIVE CV LAB;  Service: Cardiovascular;  Laterality: N/A;   Family History  Problem Relation Age of Onset  . Heart attack Father   . Cancer Mother    Social History  Substance Use Topics  . Smoking status: Never Smoker   . Smokeless tobacco: Never Used  . Alcohol Use: Yes     Comment: 02/18/2015 "once or twice/yr I'll have a cocktail"  rare    Review of Systems  Constitutional: Negative for fever, appetite change and fatigue.  HENT: Positive for rhinorrhea (once in a while o2 running). Negative for sore throat.   Eyes: Negative for visual disturbance.  Respiratory: Negative for shortness of breath.   Cardiovascular: Negative for chest pain.  Gastrointestinal: Positive for diarrhea (for last couple weeks, once or twice per day, maybe 3). Negative for nausea, vomiting, abdominal pain and constipation.  Genitourinary: Negative for dysuria and difficulty urinating.  Musculoskeletal: Negative for back pain and neck stiffness.   Skin: Positive for wound (boil gluteal cleft). Negative for rash.  Neurological: Positive for light-headedness. Negative for syncope and headaches.      Allergies  Other  Home Medications   Prior to Admission medications   Medication Sig Start Date End Date Taking? Authorizing Provider  aspirin 81 MG tablet Take 81 mg by mouth daily.    Historical Provider, MD  atorvastatin (LIPITOR) 40 MG tablet Take 40 mg by mouth daily at 6 PM.  12/05/14   Historical Provider, MD  colchicine 0.6 MG tablet Take 0.6 mg by mouth daily.  09/01/14   Historical Provider, MD  febuxostat (ULORIC) 40 MG tablet Take 40 mg by mouth daily.     Historical Provider, MD  fenofibrate 54 MG tablet Take 54 mg by mouth daily. 05/04/15   Historical Provider, MD  fluticasone (FLONASE) 50 MCG/ACT nasal spray Place 1 spray into both nostrils daily. 01/09/16   [name], MD  hydrocerin (EUCERIN) CREA Apply 1 application topically 2 (two) times daily. 09/19/15   [name], PA-C  HYDROcodone-acetaminophen (NORCO) 7.5-325 MG tablet Take 1 tablet by mouth every 4 (four) hours as needed for moderate pain. 01/09/16   [name], MD  hydrOXYzine (ATARAX/VISTARIL) 25 MG tablet Take 25 mg by mouth every 6 (six) hours as needed for itching.    Historical Provider, MD  insulin detemir (LEVEMIR) 100 UNIT/ML injection 25units qam and 30 units qhs Patient taking differently: Inject 30-35 Units into the skin 2 (two) times daily. 25units qam and 30 units qhs 01/09/16   [name], MD  lactobacillus acidophilus (BACID) TABS tablet Take 2 tablets by mouth 2 (two) times daily.    Historical Provider, MD  lidocaine (LINDAMANTLE) 3 % CREA cream Apply 1 application topically 3 (three) times daily as needed (rash).    Historical Provider, MD  NOVOLOG 100 UNIT/ML injection Inject 30-40 Units into the skin 3 (three) times daily with meals. Sliding scale 05/12/15   Historical Provider, MD  potassium chloride SA (K-DUR,KLOR-CON) 20 MEQ tablet Take 2  tablets (40 mEq total) by mouth daily. Take 3 tabs in AM and 2 tabs in PM 01/09/16   [name], MD  sodium chloride (OCEAN) 0.65 % SOLN nasal spray Place 1 spray into both nostrils as needed for congestion. 01/09/16   [name], MD  torsemide (DEMADEX) 20 MG tablet Take 4 tablets (80 mg total) by mouth 2 (two) times daily. 01/09/16   [name], MD   BP 100/70 mmHg  Pulse 82  Temp(Src) 97.8 F (36.6 C)  Resp 13  Wt 375 lb (170.099 kg)  SpO2 99% Physical Exam  Constitutional: He is oriented to person, place, and time. He appears well-developed and well-nourished. No distress.  Obese, talkative, singing, joking  HENT:  Head: Normocephalic and atraumatic.  Eyes: Conjunctivae and EOM are normal.  Neck: Normal range of motion.  Cardiovascular: Normal rate, normal heart sounds and intact distal pulses.  Exam  reveals no gallop and no friction rub.   No murmur heard. distant  Pulmonary/Chest: Effort normal and breath sounds normal. No respiratory distress. He has no wheezes. He has no rales.  Abdominal: Soft. He exhibits no distension. There is no tenderness. There is no guarding.  Genitourinary:  Top of gluteal cleft with raised area of dry skin approx 2-3cm raised/heaped going from left to right side of gluteal cleft, firm, no fluctuance, erythema of buttocks without sign of cellulitis or crepitus, consistent with stage I pressure ulcer  Scrotum with mild erythema, not tenderness, no fluctuance, no crepitus, no vesicles   Musculoskeletal: He exhibits no edema.  Bilateral lower extremity erythema, right worse than left, swelling of LE (right worse than left--pt reports these are chronic changes and have greatly improved from prior)    Toes with dark spots (which pt reports "i get those when i put my feet down") 3sec cap refill  Neurological: He is alert and oriented to person, place, and time.  Skin: Skin is warm and dry. He is not diaphoretic.  Nursing note and vitals reviewed.   ED Course   Procedures (including critical care time) Labs Review Labs Reviewed  COMPREHENSIVE METABOLIC PANEL - Abnormal; Notable for the following:    Chloride 94 (*)    CO2 34 (*)    BUN 55 (*)    Creatinine, Ser 2.28 (*)    ALT 16 (*)    GFR calc non Af Amer 28 (*)    GFR calc Af Amer 33 (*)    All other components within normal limits  CBC WITH DIFFERENTIAL/PLATELET - Abnormal; Notable for the following:    WBC 13.7 (*)    RDW 15.8 (*)    Neutro Abs 7.9 (*)    Lymphs Abs 4.7 (*)    All other components within normal limits  URINALYSIS, ROUTINE W REFLEX MICROSCOPIC (NOT AT [facility]) - Abnormal; Notable for the following:    APPearance CLOUDY (*)    All other components within normal limits  I-STAT CG4 LACTIC ACID, ED - Abnormal; Notable for the following:    Lactic Acid, Venous 2.01 (*)    All other components within normal limits  CULTURE, BLOOD (ROUTINE X 2)  CULTURE, BLOOD (ROUTINE X 2)  URINE CULTURE  BRAIN NATRIURETIC PEPTIDE  I-STAT CG4 LACTIC ACID, ED  I-STAT TROPOININ, ED  I-STAT CG4 LACTIC ACID, ED    Imaging Review Dg Chest Portable 1 View  02/08/2016  CLINICAL DATA:  Low blood pressure on routine office visit. History of renal failure. EXAM: PORTABLE CHEST 1 VIEW COMPARISON:  01/02/2016. FINDINGS: Low lung volumes. Cardiomegaly. Prior CABG. Bibasilar atelectasis without focal infiltrates or overt failure. Mild vascular congestion. No pneumothorax. Small BILATERAL effusions. IMPRESSION: Low lung volumes with cardiomegaly, bibasilar atelectasis, and mild vascular congestion. No overt failure. Similar appearance to priors. Electronically Signed   By: Elsie Stain M.D.   On: 02/08/2016 15:06   I have personally reviewed and evaluated these images and lab results as part of my medical decision-making.   EKG Interpretation   Date/Time:  Wednesday February 08 2016 14:29:56 EDT Ventricular Rate:  87 PR Interval:  233 QRS Duration: 150 QT Interval:  403 QTC Calculation: 485 R  Axis:   166 Text Interpretation:  Sinus rhythm Multiple ventricular premature  complexes Prolonged PR interval RBBB and LPFB No significant change since  last tracing Confirmed by [facility] MD, Wandra Babin ([id number]) on 02/08/2016 5:52:18  PM       Emergency  Ultrasound:  US Guidance for needle guidance  Performed by Dr. Dalene Seltzer  Indication: needs access  Linear probe used in real-time to visualize location of needle entry through skin. Interpretation: successful placement of IV Image not archived electronically.   CRITICAL CARE: HYPOTENSION Performed by: Rhae Lerner   Total critical care time: 30 minutes  Critical care time was exclusive of separately billable procedures and treating other patients.  Critical care was necessary to treat or prevent imminent or life-threatening deterioration.  Critical care was time spent personally by me on the following activities: development of treatment plan with patient and/or surrogate as well as nursing, discussions with consultants, evaluation of patient's response to treatment, examination of patient, obtaining history from patient or surrogate, ordering and performing treatments and interventions, ordering and review of laboratory studies, ordering and review of radiographic studies, pulse oximetry and re-evaluation of patient's condition.  MDM   Final diagnoses:  Acute-on-chronic kidney injury (HCC)  Hypotension, unspecified hypotension type  Lactic acidosis   65 year old male with a history of diabetes, obstructive sleep apnea on CPAP, hypertension, hyperlipidemia, chronic diastolic heart failure on 4L of O2 at home, aortic valve replacement 2013, history of MRSA abscess, CKD, admission for acute hypoxic respiratory failure and hypotension in March, presents with concern for hypotension discovered while he was at his outpatient nephrology appointment.  On arrival to the emergency department, patient is hypotensive to 80s/50s,   however mentating normally (talkative, joking, singing), asymptomatic and code sepsis was not initiated given unclear if infectious source. Patient was however, initiated on IV fluids and given vancomycin and zosyn for possibility of sepsis with unclear source. No dyspnea, do not suspect PE/worsening CHF as cause of low BP.  No clear signs of tamponade--Attempted bedside US however had poor windows.  Pt likely dehydrated from diarrhea and torsemide, however is not tachycardic.  Possible medication related, however pt denies changes.  No clear source of sepsis, however blood cx ordered and pending and abx given. (Urinalysis and CXR without signs of infection, chronic skin changes on exam without signs of abscess or acute infxn.)  Pt with mild elevation in lactic acid, mild leukocytosis (unchaged from prior), and acute on chronic kidney injury.  Blood pressures did not improve with initial 3L IV fluids and ordered 5L total. Discussed with critical care physician on call and plan is to give the full 5 L then reassess blood pressures.  He is tolerating fluids at this time from respiratory standpoint, continuing to have normal saturations on 4L home O2 pNC.   Alvira Monday, MD 02/08/16 [id number]

## 2016-02-08 NOTE — ED Provider Notes (Addendum)
Complains of progressivelyworsening generalized weakness over the past 2 days. He went to his nephrologist today blood pressure was noted to be 70/40. Sent here for further evaluation. Other associated symptoms include diarrhea for approximately 2 weeks. He denies pain anywhere. He states he feels dehydrated She feels improved after treatment with intravenous hydration A shot with acute on chronic kidney injury likely secondary to dehydration. Dr Adela Glimpse consulted and wil arrange for 23 hour observation to  telemetry  Doug Sou, MD 02/08/16 2112  Doug Sou, MD 02/08/16 2143

## 2016-02-08 NOTE — H&P (Addendum)
PCP: [name], MD  Endocrinologist [facility] Shirlee Latch  Referring provider Trellis Paganini   Chief Complaint:  hypoptension  HPI: Antonio [name] is a 65 y.o. male   has a past medical history of Blindness of right eye; MRSA infection; Anxiety; Aortic stenosis; HTN (hypertension); HLD (hyperlipidemia); Gout; Chronic diastolic heart failure (HCC); DM2 (diabetes mellitus, type 2) (HCC); OSA on CPAP; Hepatitis; Arthritis; and History of acute renal failure.   Patient presents with 2 week history of diarrhea having at least 2-3 stools a day. Patient was seen today during routine appointment nephrologist and was noted to be hypotensive down to 70/50. He's been reporting some diarrhea and decreased his insulin dosing ha been changed lately he s unsure of the name of the new medication.  His blood sugar started to run lower. Have not endorsing any fevers. He was lightheaded while waiting at nephrologist is currently feeling better.  IN ER: Initially [facility] male was consulted who recommended aggressive fluid resuscitation blood pressure has come up to 110/80 after 5 L of normal saline he was started on broad-spectrum antibiotics with Zosyn and vancomycin for presumed sepsis initial lactic acid 2.01 after rehydration down to 0.64  Regarding pertinent past history: Patient has known history of diastolic heart failure, with dilated right ventricle coronary artery disease, history of bioprosthetic AVR followed by cardiology.  has been admitted in March for respiratory distress and hypercarbic respiratory failure secondary to sleep apnea noncompliance patient has history of morbid obesity poorly treated sleep apnea and obesity hypoventilation syndrome. He has history of chronic kidney disease followed by nephrologist at [facility]. Patient is on oxygen at home and reports currently been compliant C Pap  Hospitalist was called for admission for presumed SIRS versus dehydration from  diarrhea  Review of Systems:    Pertinent positives include: diarrhea, lightheadedness,   Constitutional:  No weight loss, night sweats, Fevers, chills, fatigue, weight loss  HEENT:  No headaches, Difficulty swallowing,Tooth/dental problems,Sore throat,  No sneezing, itching, ear ache, nasal congestion, post nasal drip,  Cardio-vascular:  No chest pain, Orthopnea, PND, anasarca, dizziness, palpitations.no Bilateral lower extremity swelling  GI:  No heartburn, indigestion, abdominal pain, nausea, vomiting,  change in bowel habits, loss of appetite, melena, blood in stool, hematemesis Resp:  no shortness of breath at rest. No dyspnea on exertion, No excess mucus, no productive cough, No non-productive cough, No coughing up of blood. No change in color of mucus.No wheezing. Skin:  no rash or lesions. No jaundice GU:  no dysuria, change in color of urine, no urgency or frequency. No straining to urinate.  No flank pain.  Musculoskeletal:  No joint pain or no joint swelling. No decreased range of motion. No back pain.  Psych:  No change in mood or affect. No depression or anxiety. No memory loss.  Neuro: no localizing neurological complaints, no tingling, no weakness, no double vision, no gait abnormality, no slurred speech, no confusion  Otherwise ROS are negative except for above, 10 systems were reviewed  Past Medical History: Past Medical History  Diagnosis Date  . Blindness of right eye     a. due to amblyopia as child  . Hx MRSA infection     a. thigh abcess 2006  . Anxiety   . Aortic stenosis     a. s/p AVR 11/29/11  . HTN (hypertension)   . HLD (hyperlipidemia)   . Gout   . Chronic diastolic heart failure (HCC)     a.  Echo 08/11/2015 LVEF 55-60% with normal RV function  . DM2 (diabetes mellitus, type 2) (HCC)   . OSA on CPAP   . Hepatitis   . Arthritis   . History of acute renal failure    Past Surgical History  Procedure Laterality Date  . Lumbar laminectomy   10/30/1983  . Aortic valve replacement  11/29/2011    Procedure: AORTIC VALVE REPLACEMENT (AVR);  Surgeon: [name], MD;  Location: [facility] OR;  Service: Open Heart Surgery;  Laterality: N/A;  with nitric oxide  . Right heart catheterization N/A 11/28/2011    Procedure: RIGHT HEART CATH;  Surgeon: [name], MD;  Location: [facility] CATH LAB;  Service: Cardiovascular;  Laterality: N/A;  . Cardiac valve replacement    . Incision and drainage abscess Left 08/2005    "upper/inner thigh; that's where the MRSA was"  . Back surgery    . Cardiac catheterization    . Cardiac catheterization N/A 10/14/2015    Procedure: Right Heart Cath;  Surgeon: [name], MD;  Location: [facility] INVASIVE CV LAB;  Service: Cardiovascular;  Laterality: N/A;     Medications: Prior to Admission medications   Medication Sig Start Date End Date Taking? Authorizing Provider  aspirin 81 MG tablet Take 81 mg by mouth daily.   Yes Historical Provider, MD  atorvastatin (LIPITOR) 40 MG tablet Take 40 mg by mouth daily at 6 PM.  12/05/14  Yes Historical Provider, MD  colchicine 0.6 MG tablet Take 0.6 mg by mouth daily.  09/01/14  Yes Historical Provider, MD  febuxostat (ULORIC) 40 MG tablet Take 40 mg by mouth daily.    Yes Historical Provider, MD  fenofibrate 54 MG tablet Take 54 mg by mouth daily. 05/04/15  Yes Historical Provider, MD  fluticasone (FLONASE) 50 MCG/ACT nasal spray Place 1 spray into both nostrils daily. 01/09/16  Yes [name], MD  hydrocerin (EUCERIN) CREA Apply 1 application topically 2 (two) times daily. 09/19/15  Yes [name], PA-C  HYDROcodone-acetaminophen (NORCO) 7.5-325 MG tablet Take 1 tablet by mouth every 4 (four) hours as needed for moderate pain. 01/09/16  Yes [name], MD  hydrOXYzine (ATARAX/VISTARIL) 25 MG tablet Take 25 mg by mouth every 6 (six) hours as needed for itching.   Yes Historical Provider, MD  insulin detemir (LEVEMIR) 100 UNIT/ML injection 25units qam and 30 units  qhs Patient taking differently: Inject 30-35 Units into the skin 2 (two) times daily. 25units qam and 30 units qhs 01/09/16  Yes [name], MD  lidocaine (LINDAMANTLE) 3 % CREA cream Apply 1 application topically 3 (three) times daily as needed (rash).   Yes Historical Provider, MD  NOVOLOG 100 UNIT/ML injection Inject 30-40 Units into the skin 3 (three) times daily with meals. Sliding scale 05/12/15  Yes Historical Provider, MD  OXYGEN Inhale 4 L into the lungs at bedtime.   Yes Historical Provider, MD  potassium chloride SA (K-DUR,KLOR-CON) 20 MEQ tablet Take 2 tablets (40 mEq total) by mouth daily. Take 3 tabs in AM and 2 tabs in PM Patient taking differently: Take 40 mEq by mouth 2 (two) times daily.  01/09/16  Yes [name], MD  sodium chloride (OCEAN) 0.65 % SOLN nasal spray Place 1 spray into both nostrils as needed for congestion. 01/09/16  Yes [name], MD  torsemide (DEMADEX) 20 MG tablet Take 4 tablets (80 mg total) by mouth 2 (two) times daily. 01/09/16  Yes [name], MD    Allergies:  Allergies  Allergen Reactions  . Other Other (See Comments)    Salt causes "extreme" edema.    Social History:  Ambulatory   walker Lives at home alone,   With home health and home PT      reports that he has never smoked. He has never used smokeless tobacco. He reports that he drinks alcohol. He reports that he does not use illicit drugs.     Family History: family history includes Cancer in his mother; Heart attack in his father.    Physical Exam: Patient Vitals for the past 24 hrs:  BP Temp Pulse Resp SpO2 Weight  02/08/16 2033 110/80 mmHg - - - - -  02/08/16 2000 - - 81 22 99 % -  02/08/16 1930 - - 80 19 94 % -  02/08/16 1900 - - 82 13 99 % -  02/08/16 1822 100/70 mmHg - 82 - 99 % -  02/08/16 1809 108/76 mmHg - - - - -  02/08/16 1709 (!) 88/64 mmHg - - - - -  02/08/16 1700 (!) 85/45 mmHg - 80 13 98 % -  02/08/16 1624 (!) 75/58 mmHg - 82 - 97 % -  02/08/16 1615 (!) 87/64 mmHg - 80 15 97  % -  02/08/16 1600 (!) 75/57 mmHg - 89 17 96 % -  02/08/16 1545 (!) 103/54 mmHg - 84 20 95 % -  02/08/16 1531 (!) 99/53 mmHg - 89 18 97 % -  02/08/16 1515 93/64 mmHg - 86 24 96 % -  02/08/16 1505 103/61 mmHg - 86 17 94 % -  02/08/16 1345 90/69 mmHg - 84 - 94 % -  02/08/16 1337 (!) 83/63 mmHg 97.8 F (36.6 C) 82 14 97 % (!) 170.099 kg (375 lb)    1. General:  in No Acute distress 2. Psychological: Alert and Oriented 3. Head/ENT:    Dry Mucous Membranes                          Head Non traumatic, neck supple                            Poor Dentition 4. SKIN: normal   Skin turgor,  Skin clean Dry and intact no rash 5. Heart: Regular rate and rhythm systolic Murmur, Rub or gallop 6. Lungs:   no wheezes or crackles   7. Abdomen: Soft, non-tender, Non distended 8. Lower extremities: no clubbing, Noted cyanosis right worse than left, 1+ edema bilaterally 9. Neurologically Grossly intact, moving all 4 extremities equally 10. MSK: Normal range of motion  body mass index is 52.33 kg/(m^2).   Labs on Admission:   Results for orders placed or performed during the hospital encounter of 02/08/16 (from the past 24 hour(s))  I-Stat CG4 Lactic Acid, ED  (not at  [facility])     Status: None   Collection Time: 02/08/16  2:47 PM  Result Value Ref Range   Lactic Acid, Venous 1.98 0.5 - 2.0 mmol/L  I-Stat CG4 Lactic Acid, ED     Status: Abnormal   Collection Time: 02/08/16  3:12 PM  Result Value Ref Range   Lactic Acid, Venous 2.01 (HH) 0.5 - 2.0 mmol/L   Comment NOTIFIED PHYSICIAN   Comprehensive metabolic panel     Status: Abnormal   Collection Time: 02/08/16  3:13 PM  Result Value Ref Range   Sodium 142 135 -  145 mmol/L   Potassium 4.6 3.5 - 5.1 mmol/L   Chloride 94 (L) 101 - 111 mmol/L   CO2 34 (H) 22 - 32 mmol/L   Glucose, Bld 88 65 - 99 mg/dL   BUN 55 (H) 6 - 20 mg/dL   Creatinine, Ser 1.30 (H) 0.61 - 1.24 mg/dL   Calcium 9.4 8.9 - 86.5 mg/dL   Total Protein 7.1 6.5 - 8.1 g/dL    Albumin 3.6 3.5 - 5.0 g/dL   AST 22 15 - 41 U/L   ALT 16 (L) 17 - 63 U/L   Alkaline Phosphatase 72 38 - 126 U/L   Total Bilirubin 0.5 0.3 - 1.2 mg/dL   GFR calc non Af Amer 28 (L) >60 mL/min   GFR calc Af Amer 33 (L) >60 mL/min   Anion gap 14 5 - 15  CBC WITH DIFFERENTIAL     Status: Abnormal   Collection Time: 02/08/16  3:13 PM  Result Value Ref Range   WBC 13.7 (H) 4.0 - 10.5 K/uL   RBC 5.01 4.22 - 5.81 MIL/uL   Hemoglobin 13.5 13.0 - 17.0 g/dL   HCT 78.4 69.6 - 29.5 %   MCV 87.8 78.0 - 100.0 fL   MCH 26.9 26.0 - 34.0 pg   MCHC 30.7 30.0 - 36.0 g/dL   RDW 28.4 (H) 13.2 - 44.0 %   Platelets 288 150 - 400 K/uL   Neutrophils Relative % 58 %   Neutro Abs 7.9 (H) 1.7 - 7.7 K/uL   Lymphocytes Relative 34 %   Lymphs Abs 4.7 (H) 0.7 - 4.0 K/uL   Monocytes Relative 6 %   Monocytes Absolute 0.8 0.1 - 1.0 K/uL   Eosinophils Relative 2 %   Eosinophils Absolute 0.3 0.0 - 0.7 K/uL   Basophils Relative 0 %   Basophils Absolute 0.0 0.0 - 0.1 K/uL  Brain natriuretic peptide     Status: None   Collection Time: 02/08/16  3:13 PM  Result Value Ref Range   B Natriuretic Peptide 29.9 0.0 - 100.0 pg/mL  Urinalysis, Routine w reflex microscopic (not at [facility])     Status: Abnormal   Collection Time: 02/08/16  3:14 PM  Result Value Ref Range   Color, Urine YELLOW YELLOW   APPearance CLOUDY (A) CLEAR   Specific Gravity, Urine 1.010 1.005 - 1.030   pH 5.0 5.0 - 8.0   Glucose, UA NEGATIVE NEGATIVE mg/dL   Hgb urine dipstick NEGATIVE NEGATIVE   Bilirubin Urine NEGATIVE NEGATIVE   Ketones, ur NEGATIVE NEGATIVE mg/dL   Protein, ur NEGATIVE NEGATIVE mg/dL   Nitrite NEGATIVE NEGATIVE   Leukocytes, UA NEGATIVE NEGATIVE  I-Stat CG4 Lactic Acid, ED  (not at  [facility])     Status: None   Collection Time: 02/08/16  7:35 PM  Result Value Ref Range   Lactic Acid, Venous 0.64 0.5 - 2.0 mmol/L  CBG monitoring, ED     Status: None   Collection Time: 02/08/16  7:46 PM  Result Value Ref Range    Glucose-Capillary 93 65 - 99 mg/dL   Comment 1 Document in Chart     UA    no evidence of UTI  Lab Results  Component Value Date   HGBA1C 10.9* 01/01/2016    Estimated Creatinine Clearance: 51.7 mL/min (by C-G formula based on Cr of 2.28).  BNP (last 3 results) No results for input(s): PROBNP in the last 8760 hours.  Other results:  I have pearsonaly reviewed this: ECG REPORT  Rate 87  Rhythm: Sinus rhythm with frequent PVCs ST&T Change: No acute ischemic changes QTC 485  Filed Weights   02/08/16 1337  Weight: 170.099 kg (375 lb)     Cultures:    Component Value Date/Time   SDES URINE, RANDOM 01/03/2016 0147   SPECREQUEST NONE 01/03/2016 0147   CULT NO GROWTH 1 DAY 01/01/2016 1423   REPTSTATUS 01/04/2016 FINAL 01/03/2016 0147     Radiological Exams on Admission: Dg Chest Portable 1 View  02/08/2016  CLINICAL DATA:  Low blood pressure on routine office visit. History of renal failure. EXAM: PORTABLE CHEST 1 VIEW COMPARISON:  01/02/2016. FINDINGS: Low lung volumes. Cardiomegaly. Prior CABG. Bibasilar atelectasis without focal infiltrates or overt failure. Mild vascular congestion. No pneumothorax. Small BILATERAL effusions. IMPRESSION: Low lung volumes with cardiomegaly, bibasilar atelectasis, and mild vascular congestion. No overt failure. Similar appearance to priors. Electronically Signed   By: Elsie Stain M.D.   On: 02/08/2016 15:06    Chart has been reviewed  Family at  Bedside  plan of care was discussed with  Daughter Kreg Shropshire [phone]  Assessment/Plan  65 year old gentleman with history of diastolic heart failure, chronic kidney disease, obesity hypoventilation syndrome Admitted secondary to dehydration is setting of diarrhea with hypotension which currently improved      Present on Admission:  . Chronic diastolic CHF (congestive heart failure) (HCC) - evidence of dehydration, hold torsemide for now Patient is status post 5 L. Blood pressure is  not optimal 120s. We'll continue to monitor resume torsemide as able  . Acute renal failure superimposed on stage 3 chronic kidney disease (HCC) . Hyperlipidemia -stable continue home medication . Obesity hypoventilation syndrome (HCC) on oxygen at home will continue . OSA on CPAP have been compliant will reorder,  . PAF (paroxysmal atrial fibrillation) (HCC)  - patient denies history of atrial fibrillation not on any anticoagulation history father clarified  . Diarrhea obtain stool studies as well as check for C. difficile  . Dehydration  -likely secondary to diarrhea will rehydrate Diabetes mellitus patient is unclear about his home regimen at this point will resume previous regimen but will need to clarify with apparently patient have had poor by mouth) intake today his blood sugar has been somewhat down from his baseline  lower extremities appear to be cyanotic we'll check ABIs   Prophylaxis: SCD   CODE STATUS:  FULL CODE as per patient    Disposition: To home once workup is complete and patient is stable  Other plan as per orders.  I have spent a total of 56 min on this admission    Sanna Porcaro 02/08/2016, 10:23 PM    [facility]  Pager [phone]   after 2 AM please page floor coverage PA If 7AM-7PM, please contact the day team taking care of the patient  [email or web address]  Password [email or web address]

## 2016-02-08 NOTE — ED Notes (Signed)
Dinner tray ordered for pt per MD.

## 2016-02-08 NOTE — Progress Notes (Signed)
D/W ED for possible ICU admit. Pt OHS, hypotension now s/p 2L, lactic acid = 2 with AKI with possible sepsis.   Asked to continue IVF, continue to goal of about 5L (30cc/kg), and call back if not doing better for ICU eval, otherwise can likely be evaluated on stepdown.   Nicholos Johns, M.D.  02/08/2016

## 2016-02-08 NOTE — ED Notes (Signed)
Per [facility] patient from routine nephrologists appointment when blood pressure was measured to be 70/50.  Nephrology office called [phone].  En route EMS measured pressure to be 90/70.  Patient did not receive fluids en route due to chronic kidney disease and congestive heart failure.  Patient alert and oriented at his time.

## 2016-02-08 NOTE — Progress Notes (Signed)
Pharmacy Antibiotic Note  Antonio [name] is a 65 y.o. male admitted on 02/08/2016 with sepsis.  Brought to [facility] due to hypotension (70/50) at nephrology appointment.  Did not receive fluids due to CKD and CHF.  Other PMH includes diabetes, aortic valve replacement and history of MRSA abscess.  Received IV antibiotics in 12/2015 (vanc and zosyn) before being discharged on doxycycline.  Pharmacy has been consulted for Vancomycin and Zosyn dosing.  On Admit: Afebrile, RR 84, RR 14, WBC pending Hypotensive, A&O, SCr 2.28 (CrCl 51.7), LA 1.98/2.01  Plan: --Vancomycin 2500 mg IV (1000 mg + 1500 mg) x 1, then 1750 mg IV q24h --Zosyn 3.375g IV q8h (extended infusion) --Obtain vanc trough at steady state --Follow renal function, clinical course and cultures    Weight: (!) 375 lb (170.099 kg) (measured by Nephrology office)  Temp (24hrs), Avg:97.8 F (36.6 C), Min:97.8 F (36.6 C), Max:97.8 F (36.6 C)  No results for input(s): WBC, CREATININE, LATICACIDVEN, VANCOTROUGH, VANCOPEAK, VANCORANDOM, GENTTROUGH, GENTPEAK, GENTRANDOM, TOBRATROUGH, TOBRAPEAK, TOBRARND, AMIKACINPEAK, AMIKACINTROU, AMIKACIN in the last 168 hours.  Estimated Creatinine Clearance: 74.2 mL/min (by C-G formula based on Cr of 1.59).    Allergies  Allergen Reactions  . Other Other (See Comments)    Salt causes "extreme" edema.    Antimicrobials this admission: 4/12 Zosyn >>  4/12 Vanc >>   Dose adjustments this admission:   Microbiology results: 4/12 BCx:  4/12 UCx:     Thank you for allowing pharmacy to be a part of this patient's care.  Kathlynn Grate 02/08/2016 2:29 PM

## 2016-02-09 ENCOUNTER — Encounter (HOSPITAL_COMMUNITY): Payer: Self-pay | Admitting: General Practice

## 2016-02-09 ENCOUNTER — Observation Stay (HOSPITAL_COMMUNITY): Payer: Medicare Other

## 2016-02-09 DIAGNOSIS — I480 Paroxysmal atrial fibrillation: Secondary | ICD-10-CM | POA: Diagnosis not present

## 2016-02-09 DIAGNOSIS — E785 Hyperlipidemia, unspecified: Secondary | ICD-10-CM | POA: Diagnosis not present

## 2016-02-09 DIAGNOSIS — M109 Gout, unspecified: Secondary | ICD-10-CM | POA: Diagnosis not present

## 2016-02-09 DIAGNOSIS — I350 Nonrheumatic aortic (valve) stenosis: Secondary | ICD-10-CM | POA: Diagnosis not present

## 2016-02-09 DIAGNOSIS — Z79899 Other long term (current) drug therapy: Secondary | ICD-10-CM | POA: Diagnosis not present

## 2016-02-09 DIAGNOSIS — Z7951 Long term (current) use of inhaled steroids: Secondary | ICD-10-CM | POA: Diagnosis not present

## 2016-02-09 DIAGNOSIS — M7989 Other specified soft tissue disorders: Secondary | ICD-10-CM | POA: Diagnosis not present

## 2016-02-09 DIAGNOSIS — E860 Dehydration: Secondary | ICD-10-CM | POA: Diagnosis not present

## 2016-02-09 DIAGNOSIS — E662 Morbid (severe) obesity with alveolar hypoventilation: Secondary | ICD-10-CM | POA: Diagnosis not present

## 2016-02-09 DIAGNOSIS — N170 Acute kidney failure with tubular necrosis: Secondary | ICD-10-CM | POA: Diagnosis not present

## 2016-02-09 DIAGNOSIS — A084 Viral intestinal infection, unspecified: Secondary | ICD-10-CM | POA: Diagnosis not present

## 2016-02-09 DIAGNOSIS — Z23 Encounter for immunization: Secondary | ICD-10-CM | POA: Diagnosis not present

## 2016-02-09 DIAGNOSIS — Z794 Long term (current) use of insulin: Secondary | ICD-10-CM | POA: Diagnosis not present

## 2016-02-09 DIAGNOSIS — Z6841 Body Mass Index (BMI) 40.0 and over, adult: Secondary | ICD-10-CM | POA: Diagnosis not present

## 2016-02-09 DIAGNOSIS — I959 Hypotension, unspecified: Secondary | ICD-10-CM | POA: Diagnosis present

## 2016-02-09 DIAGNOSIS — I130 Hypertensive heart and chronic kidney disease with heart failure and stage 1 through stage 4 chronic kidney disease, or unspecified chronic kidney disease: Secondary | ICD-10-CM | POA: Diagnosis not present

## 2016-02-09 DIAGNOSIS — E872 Acidosis: Secondary | ICD-10-CM | POA: Diagnosis not present

## 2016-02-09 DIAGNOSIS — N183 Chronic kidney disease, stage 3 (moderate): Secondary | ICD-10-CM | POA: Diagnosis not present

## 2016-02-09 DIAGNOSIS — I5032 Chronic diastolic (congestive) heart failure: Secondary | ICD-10-CM | POA: Diagnosis not present

## 2016-02-09 DIAGNOSIS — K5900 Constipation, unspecified: Secondary | ICD-10-CM | POA: Diagnosis not present

## 2016-02-09 DIAGNOSIS — E1122 Type 2 diabetes mellitus with diabetic chronic kidney disease: Secondary | ICD-10-CM | POA: Diagnosis not present

## 2016-02-09 DIAGNOSIS — R230 Cyanosis: Secondary | ICD-10-CM | POA: Diagnosis not present

## 2016-02-09 DIAGNOSIS — I2510 Atherosclerotic heart disease of native coronary artery without angina pectoris: Secondary | ICD-10-CM | POA: Diagnosis not present

## 2016-02-09 DIAGNOSIS — Z9111 Patient's noncompliance with dietary regimen: Secondary | ICD-10-CM | POA: Diagnosis not present

## 2016-02-09 DIAGNOSIS — H5441 Blindness, right eye, normal vision left eye: Secondary | ICD-10-CM | POA: Diagnosis not present

## 2016-02-09 DIAGNOSIS — I5033 Acute on chronic diastolic (congestive) heart failure: Secondary | ICD-10-CM | POA: Diagnosis not present

## 2016-02-09 DIAGNOSIS — Z8614 Personal history of Methicillin resistant Staphylococcus aureus infection: Secondary | ICD-10-CM | POA: Diagnosis not present

## 2016-02-09 DIAGNOSIS — Z7982 Long term (current) use of aspirin: Secondary | ICD-10-CM | POA: Diagnosis not present

## 2016-02-09 DIAGNOSIS — N179 Acute kidney failure, unspecified: Secondary | ICD-10-CM | POA: Diagnosis not present

## 2016-02-09 DIAGNOSIS — A419 Sepsis, unspecified organism: Secondary | ICD-10-CM | POA: Diagnosis present

## 2016-02-09 DIAGNOSIS — Z953 Presence of xenogenic heart valve: Secondary | ICD-10-CM | POA: Diagnosis not present

## 2016-02-09 DIAGNOSIS — G4733 Obstructive sleep apnea (adult) (pediatric): Secondary | ICD-10-CM | POA: Diagnosis not present

## 2016-02-09 DIAGNOSIS — N189 Chronic kidney disease, unspecified: Secondary | ICD-10-CM | POA: Diagnosis not present

## 2016-02-09 LAB — MRSA PCR SCREENING: MRSA by PCR: NEGATIVE

## 2016-02-09 LAB — COMPREHENSIVE METABOLIC PANEL
ALT: 13 U/L — ABNORMAL LOW (ref 17–63)
ANION GAP: 11 (ref 5–15)
AST: 18 U/L (ref 15–41)
Albumin: 2.9 g/dL — ABNORMAL LOW (ref 3.5–5.0)
Alkaline Phosphatase: 56 U/L (ref 38–126)
BUN: 47 mg/dL — AB (ref 6–20)
CHLORIDE: 98 mmol/L — AB (ref 101–111)
CO2: 32 mmol/L (ref 22–32)
Calcium: 8.4 mg/dL — ABNORMAL LOW (ref 8.9–10.3)
Creatinine, Ser: 1.8 mg/dL — ABNORMAL HIGH (ref 0.61–1.24)
GFR, EST AFRICAN AMERICAN: 44 mL/min — AB (ref >60)
GFR, EST NON AFRICAN AMERICAN: 38 mL/min — AB (ref >60)
Glucose, Bld: 261 mg/dL — ABNORMAL HIGH (ref 65–99)
POTASSIUM: 4.5 mmol/L (ref 3.5–5.1)
Sodium: 141 mmol/L (ref 135–145)
TOTAL PROTEIN: 5.9 g/dL — AB (ref 6.5–8.1)
Total Bilirubin: 0.6 mg/dL (ref 0.3–1.2)

## 2016-02-09 LAB — PHOSPHORUS: PHOSPHORUS: 4.3 mg/dL (ref 2.5–4.6)

## 2016-02-09 LAB — C DIFFICILE QUICK SCREEN W PCR REFLEX
C DIFFICILE (CDIFF) INTERP: NEGATIVE
C DIFFICILE (CDIFF) TOXIN: NEGATIVE
C Diff antigen: NEGATIVE

## 2016-02-09 LAB — CBC
HEMATOCRIT: 38.5 % — AB (ref 39.0–52.0)
HEMOGLOBIN: 11.5 g/dL — AB (ref 13.0–17.0)
MCH: 26.4 pg (ref 26.0–34.0)
MCHC: 29.9 g/dL — ABNORMAL LOW (ref 30.0–36.0)
MCV: 88.5 fL (ref 78.0–100.0)
Platelets: 251 10*3/uL (ref 150–400)
RBC: 4.35 MIL/uL (ref 4.22–5.81)
RDW: 15.9 % — ABNORMAL HIGH (ref 11.5–15.5)
WBC: 8.7 10*3/uL (ref 4.0–10.5)

## 2016-02-09 LAB — MAGNESIUM: MAGNESIUM: 2.3 mg/dL (ref 1.7–2.4)

## 2016-02-09 LAB — GLUCOSE, CAPILLARY
GLUCOSE-CAPILLARY: 238 mg/dL — AB (ref 65–99)
GLUCOSE-CAPILLARY: 219 mg/dL — AB (ref 65–99)
Glucose-Capillary: 177 mg/dL — ABNORMAL HIGH (ref 65–99)

## 2016-02-09 LAB — TSH: TSH: 3.707 u[IU]/mL (ref 0.350–4.500)

## 2016-02-09 MED ORDER — SODIUM CHLORIDE 0.9 % IV SOLN
INTRAVENOUS | Status: DC
  Administered 2016-02-09: 13:00:00 via INTRAVENOUS

## 2016-02-09 MED ORDER — PNEUMOCOCCAL VAC POLYVALENT 25 MCG/0.5ML IJ INJ
0.5000 mL | INJECTION | INTRAMUSCULAR | Status: AC
  Administered 2016-02-10: 0.5 mL via INTRAMUSCULAR
  Filled 2016-02-09: qty 0.5

## 2016-02-09 MED ORDER — ENOXAPARIN SODIUM 100 MG/ML ~~LOC~~ SOLN
90.0000 mg | SUBCUTANEOUS | Status: DC
  Administered 2016-02-09 – 2016-02-16 (×8): 90 mg via SUBCUTANEOUS
  Filled 2016-02-09 (×8): qty 1

## 2016-02-09 NOTE — Progress Notes (Signed)
Inpatient Diabetes Program Recommendations  AACE/ADA: New Consensus Statement on Inpatient Glycemic Control (2015)  Target Ranges:  Prepandial:   less than 140 mg/dL      Peak postprandial:   less than 180 mg/dL (1-2 hours)      Critically ill patients:  140 - 180 mg/dL   Review of Glycemic Control   Inpatient Diabetes Program Recommendations:  Correction (SSI): change Novolog to TID + HS scale per Glycemic Control order-set Diet: add carb modified to current diet Thank you  Piedad Climes BSN, RN,CDE Inpatient Diabetes Coordinator [phone] (team pager)

## 2016-02-09 NOTE — Progress Notes (Signed)
[facility] PROGRESS NOTE  Antonio [name] ZOX:[id number] DOB: Jun 17, 1951 DOA: 02/08/2016   PCP: [name], MD  Length of stay:    Assessment/Plan: Active Problems:   Hyperlipidemia   Aortic stenosis   PAF (paroxysmal atrial fibrillation) (HCC)   Chronic diastolic CHF (congestive heart failure) (HCC)   OSA on CPAP   Insulin dependent diabetes mellitus (HCC)   Obesity hypoventilation syndrome (HCC)   Acute renal failure superimposed on stage 3 chronic kidney disease (HCC)   Diarrhea   Dehydration   Acute-on-chronic kidney injury [facility])   Brief summary 65 year old male with a history of hypertension, chronic diastolic heart failure, type 2 diabetes, obstructive sleep apnea on C Pap, presents with acute on chronic renal failure and diarrhea, hypotension requiring aggressive fluid resuscitation in the ER.Hospitalist was called for admission for presumed SIRS versus dehydration from diarrhea    Assessment and plan  Chronic diastolic CHF (congestive heart failure) (HCC) - evidence of dehydration, hold torsemide for now Patient is status post 5 L. Blood pressure is not optimal 120s. We'll continue to monitor resume torsemide as able , continue gentle IV fluids . Resume Demadex in the next 2-3 days.  . Acute renal failure superimposed on stage 3 chronic kidney disease (HCC) in the setting of diarrheal illness, baseline 1.5,  2.28 >1.8 improving . Anticipate patient will be hospitalized until his renal failure is resolved.  . Hyperlipidemia -stable continue home medication . Obesity hypoventilation syndrome (HCC) on oxygen at home will continue . OSA on CPAP have been compliant will reorder,  . PAF (paroxysmal atrial fibrillation) (HCC) - patient denies history of atrial fibrillation not on any anticoagulation history father clarified  . Diarrhea obtain stool studies, likely viral gastroenteritis, C. difficile negative  , GI pathogen panel  . Dehydration -likely  secondary to diarrhea will rehydrate Diabetes mellitus continue Levemir twice a day, start patient on sliding scale insulin  lower extremities appear to be cyanotic we'll check ABIs , Venous Doppler  DVT prophylaxsis Lovenox  Code Status:      Code Status Orders    Full code    Start     Ordered     Family Communication: Discussed in detail with the patient, all imaging results, lab results explained to the patient   Disposition Plan:  Anticipate discharge in the next 1-2 days Once his renal failure resolved    Consultants:  None  Procedures:  None  Antibiotics: Anti-infectives    Start     Dose/Rate Route Frequency Ordered Stop   02/09/16 1800  vancomycin (VANCOCIN) 1,750 mg in sodium chloride 0.9 % 500 mL IVPB     1,750 mg 250 mL/hr over 120 Minutes Intravenous Every 24 hours 02/08/16 1603     02/08/16 2200  piperacillin-tazobactam (ZOSYN) IVPB 3.375 g     3.375 g 12.5 mL/hr over 240 Minutes Intravenous Every 8 hours 02/08/16 1603         HPI/Subjective: Patient denies any diarrhea today, no diarrhea since admission, concern about 20 pound weight gain  Objective: Filed Vitals:   02/08/16 2230 02/08/16 2246 02/08/16 2330 02/09/16 0951  BP:  102/60 110/66 109/58  Pulse: 86  93 94  Temp:   98.6 F (37 C) 98.3 F (36.8 C)  TempSrc:   Oral Oral  Resp: Height:    (1.803 m)   Weight:   179.035 kg (394 lb 11.2 oz)   SpO2: 94%  93% 91%    Intake/Output Summary (Last 24 hours) at 02/09/16 1111 Last data filed at 02/09/16 0943  Gross per 24 hour  Intake   2660 ml  Output   1475 ml  Net   1185 ml    Exam:  Examination:  General exam: Appears calm and comfortable  Respiratory system: Clear to auscultation. Respiratory effort normal. Cardiovascular system: S1 & S2 heard, RRR. No JVD, murmurs, rubs, gallops or clicks. No pedal edema. Gastrointestinal system: Abdomen is nondistended, soft and nontender. No organomegaly or masses felt.  Normal bowel sounds heard. Central nervous system: Alert and oriented. No focal neurological deficits. Extremities: Symmetric 5 x 5 power. Skin: No rashes, lesions or ulcers Psychiatry: Judgement and insight appear normal. Mood & affect appropriate.     Data Reviewed: I have personally reviewed following labs and imaging studies  Micro Results Recent Results (from the past 240 hour(s))  Urine culture     Status: None (Preliminary result)   Collection Time: 02/08/16  3:14 PM  Result Value Ref Range Status   Specimen Description URINE, CLEAN CATCH  Final   Special Requests NONE  Final   Culture TOO YOUNG TO READ  Final   Report Status PENDING  Incomplete  MRSA PCR Screening     Status: None   Collection Time: 02/09/16  1:45 AM  Result Value Ref Range Status   MRSA by PCR NEGATIVE NEGATIVE Final    Comment:        The GeneXpert MRSA Assay (FDA approved for NASAL specimens only), is one component of a comprehensive MRSA colonization surveillance program. It is not intended to diagnose MRSA infection nor to guide or monitor treatment for MRSA infections.   C difficile quick scan w PCR reflex     Status: None   Collection Time: 02/09/16  8:14 AM  Result Value Ref Range Status   C Diff antigen NEGATIVE NEGATIVE Final   C Diff toxin NEGATIVE NEGATIVE Final   C Diff interpretation Negative for toxigenic C. difficile  Final    Radiology Reports Dg Chest Portable 1 View  02/08/2016  CLINICAL DATA:  Low blood pressure on routine office visit. History of renal failure. EXAM: PORTABLE CHEST 1 VIEW COMPARISON:  01/02/2016. FINDINGS: Low lung volumes. Cardiomegaly. Prior CABG. Bibasilar atelectasis without focal infiltrates or overt failure. Mild vascular congestion. No pneumothorax. Small BILATERAL effusions. IMPRESSION: Low lung volumes with cardiomegaly, bibasilar atelectasis, and mild vascular congestion. No overt failure. Similar appearance to priors. Electronically Signed   By: Elsie Stain M.D.   On: 02/08/2016 15:06     CBC  Recent Labs Lab 02/08/16 1513 02/09/16 0439  WBC 13.7* 8.7  HGB 13.5 11.5*  HCT 44.0 38.5*  PLT 288 251  MCV 87.8 88.5  MCH 26.9 26.4  MCHC 30.7 29.9*  RDW 15.8* 15.9*  LYMPHSABS 4.7*  --   MONOABS 0.8  --   EOSABS 0.3  --   BASOSABS 0.0  --     Chemistries   Recent Labs Lab 02/08/16 1513 02/09/16 0439  NA 142 141  K 4.6 4.5  CL 94* 98*  CO2 34* 32  GLUCOSE 88 261*  BUN 55* 47*  CREATININE 2.28* 1.80*  CALCIUM 9.4 8.4*  MG  --  2.3  AST 22 18  ALT 16* 13*  ALKPHOS 72 56  BILITOT 0.5 0.6   ------------------------------------------------------------------------------------------------------------------ estimated creatinine clearance is 67.6 mL/min (by C-G formula based on Cr of 1.8). ------------------------------------------------------------------------------------------------------------------ No results for input(s):  HGBA1C in the last 72 hours. ------------------------------------------------------------------------------------------------------------------ No results for input(s): CHOL, HDL, LDLCALC, TRIG, CHOLHDL, LDLDIRECT in the last 72 hours. ------------------------------------------------------------------------------------------------------------------  Recent Labs  02/09/16 0439  TSH 3.707   ------------------------------------------------------------------------------------------------------------------ No results for input(s): VITAMINB12, FOLATE, FERRITIN, TIBC, IRON, RETICCTPCT in the last 72 hours.  Coagulation profile No results for input(s): INR, PROTIME in the last 168 hours.  No results for input(s): DDIMER in the last 72 hours.  Cardiac Enzymes No results for input(s): CKMB, TROPONINI, MYOGLOBIN in the last 168 hours.  Invalid input(s): CK ------------------------------------------------------------------------------------------------------------------ Invalid input(s):  POCBNP   CBG:  Recent Labs Lab 02/08/16 1946 02/08/16 2341 02/09/16 0402 02/09/16 0945  GLUCAP 93 254* 238* 177*       Studies: Dg Chest Portable 1 View  02/08/2016  CLINICAL DATA:  Low blood pressure on routine office visit. History of renal failure. EXAM: PORTABLE CHEST 1 VIEW COMPARISON:  01/02/2016. FINDINGS: Low lung volumes. Cardiomegaly. Prior CABG. Bibasilar atelectasis without focal infiltrates or overt failure. Mild vascular congestion. No pneumothorax. Small BILATERAL effusions. IMPRESSION: Low lung volumes with cardiomegaly, bibasilar atelectasis, and mild vascular congestion. No overt failure. Similar appearance to priors. Electronically Signed   By: Elsie Stain M.D.   On: 02/08/2016 15:06      Lab Results  Component Value Date   HGBA1C 10.9* 01/01/2016   HGBA1C 10.1* 02/18/2015   HGBA1C 8.3* 01/02/2014   Lab Results  Component Value Date   LDLCALC 88 09/08/2015   CREATININE 1.80* 02/09/2016       Scheduled Meds: . aspirin EC  81 mg Oral Daily  . atorvastatin  40 mg Oral q1800  . colchicine  0.6 mg Oral Daily  . enoxaparin (LOVENOX) injection  90 mg Subcutaneous Q24H  . febuxostat  40 mg Oral Daily  . fenofibrate  54 mg Oral Daily  . insulin aspart  0-15 Units Subcutaneous 6 times per day  . insulin detemir  30 Units Subcutaneous Daily  . insulin detemir  35 Units Subcutaneous QHS  . piperacillin-tazobactam (ZOSYN)  IV  3.375 g Intravenous Q8H  . [START ON 02/10/2016] pneumococcal 23 valent vaccine  0.5 mL Intramuscular Tomorrow-1000  . sodium chloride flush  3 mL Intravenous Q12H  . vancomycin  1,750 mg Intravenous Q24H   Continuous Infusions: . sodium chloride      Active Problems:   Hyperlipidemia   Aortic stenosis   PAF (paroxysmal atrial fibrillation) (HCC)   Chronic diastolic CHF (congestive heart failure) (HCC)   OSA on CPAP   Insulin dependent diabetes mellitus (HCC)   Obesity hypoventilation syndrome (HCC)   Acute renal failure  superimposed on stage 3 chronic kidney disease (HCC)   Diarrhea   Dehydration   Acute-on-chronic kidney injury (HCC)    Time spent: 45 minutes   [facility]  [facility] Pager [phone]. If 7PM-7AM, please contact night-coverage at [email or web address], password [facility] 02/09/2016, 11:11 AM

## 2016-02-09 NOTE — Progress Notes (Signed)
Patient prefers to sleep in the recliner and refused chair alarm. Will continue to monitor patient.

## 2016-02-09 NOTE — Progress Notes (Signed)
Arrived at patient's room for ABI. He states that his back is hurting and cannot get in bed at this time. Spoke with RN that test had been attempted. Will check back 4/14 to see if patient is able to get into bed just for test.   Farrel Demark, RDMS, RVT Vascular lab

## 2016-02-10 ENCOUNTER — Inpatient Hospital Stay (HOSPITAL_COMMUNITY): Payer: Medicare Other

## 2016-02-10 DIAGNOSIS — A419 Sepsis, unspecified organism: Secondary | ICD-10-CM | POA: Diagnosis not present

## 2016-02-10 DIAGNOSIS — M7989 Other specified soft tissue disorders: Secondary | ICD-10-CM

## 2016-02-10 DIAGNOSIS — R230 Cyanosis: Secondary | ICD-10-CM

## 2016-02-10 LAB — CBC
HEMATOCRIT: 39.4 % (ref 39.0–52.0)
Hemoglobin: 11.8 g/dL — ABNORMAL LOW (ref 13.0–17.0)
MCH: 26.2 pg (ref 26.0–34.0)
MCHC: 29.9 g/dL — ABNORMAL LOW (ref 30.0–36.0)
MCV: 87.6 fL (ref 78.0–100.0)
Platelets: 258 10*3/uL (ref 150–400)
RBC: 4.5 MIL/uL (ref 4.22–5.81)
RDW: 15.7 % — AB (ref 11.5–15.5)
WBC: 8.7 10*3/uL (ref 4.0–10.5)

## 2016-02-10 LAB — FECAL LACTOFERRIN, QUANT: Fecal Lactoferrin: POSITIVE

## 2016-02-10 LAB — URINE CULTURE

## 2016-02-10 LAB — COMPREHENSIVE METABOLIC PANEL
ALT: 15 U/L — AB (ref 17–63)
AST: 15 U/L (ref 15–41)
Albumin: 3.3 g/dL — ABNORMAL LOW (ref 3.5–5.0)
Alkaline Phosphatase: 62 U/L (ref 38–126)
Anion gap: 10 (ref 5–15)
BILIRUBIN TOTAL: 0.7 mg/dL (ref 0.3–1.2)
BUN: 30 mg/dL — AB (ref 6–20)
CO2: 31 mmol/L (ref 22–32)
CREATININE: 1.54 mg/dL — AB (ref 0.61–1.24)
Calcium: 8.9 mg/dL (ref 8.9–10.3)
Chloride: 101 mmol/L (ref 101–111)
GFR, EST AFRICAN AMERICAN: 53 mL/min — AB (ref >60)
GFR, EST NON AFRICAN AMERICAN: 46 mL/min — AB (ref >60)
Glucose, Bld: 201 mg/dL — ABNORMAL HIGH (ref 65–99)
POTASSIUM: 4.2 mmol/L (ref 3.5–5.1)
Sodium: 142 mmol/L (ref 135–145)
TOTAL PROTEIN: 6.4 g/dL — AB (ref 6.5–8.1)

## 2016-02-10 LAB — GLUCOSE, CAPILLARY
GLUCOSE-CAPILLARY: 163 mg/dL — AB (ref 65–99)
GLUCOSE-CAPILLARY: 216 mg/dL — AB (ref 65–99)
GLUCOSE-CAPILLARY: 244 mg/dL — AB (ref 65–99)
GLUCOSE-CAPILLARY: 250 mg/dL — AB (ref 65–99)
Glucose-Capillary: 279 mg/dL — ABNORMAL HIGH (ref 65–99)
Glucose-Capillary: 180 mg/dL — ABNORMAL HIGH (ref 65–99)
Glucose-Capillary: 136 mg/dL — ABNORMAL HIGH (ref 65–99)
Glucose-Capillary: 197 mg/dL — ABNORMAL HIGH (ref 65–99)

## 2016-02-10 LAB — HEMOGLOBIN A1C
Hgb A1c MFr Bld: 10 % — ABNORMAL HIGH (ref 4.8–5.6)
MEAN PLASMA GLUCOSE: 240 mg/dL

## 2016-02-10 LAB — NOROVIRUS GROUP 1 & 2 BY PCR, STOOL
NOROVIRUS 1 BY PCR: NEGATIVE
Norovirus 2  by PCR: NEGATIVE

## 2016-02-10 MED ORDER — OXYCODONE HCL 5 MG PO TABS
5.0000 mg | ORAL_TABLET | Freq: Four times a day (QID) | ORAL | Status: DC | PRN
  Administered 2016-02-13 – 2016-02-16 (×5): 5 mg via ORAL
  Filled 2016-02-10 (×4): qty 1

## 2016-02-10 MED ORDER — POLYETHYLENE GLYCOL 3350 17 G PO PACK
17.0000 g | PACK | Freq: Every day | ORAL | Status: DC | PRN
  Administered 2016-02-10 – 2016-02-13 (×2): 17 g via ORAL
  Filled 2016-02-10 (×2): qty 1

## 2016-02-10 MED ORDER — TORSEMIDE 20 MG PO TABS
80.0000 mg | ORAL_TABLET | Freq: Every day | ORAL | Status: DC
  Administered 2016-02-10 – 2016-02-11 (×2): 80 mg via ORAL
  Filled 2016-02-10 (×2): qty 4

## 2016-02-10 MED ORDER — ZOLPIDEM TARTRATE 5 MG PO TABS
5.0000 mg | ORAL_TABLET | Freq: Every day | ORAL | Status: DC
  Administered 2016-02-10 – 2016-02-15 (×6): 5 mg via ORAL
  Filled 2016-02-10 (×6): qty 1

## 2016-02-10 NOTE — Progress Notes (Signed)
VASCULAR LAB PRELIMINARY  PRELIMINARY  PRELIMINARY  PRELIMINARY  Bilateral lower extremity venous duplex completed.    Bilateral:  No evidence of DVT, superficial thrombosis, or Baker's Cyst.    Jenetta Loges, RVT, RDMS 02/10/2016, 1:24 PM

## 2016-02-10 NOTE — Progress Notes (Signed)
PROGRESS NOTE    Antonio [name]  EZB:[id number] DOB: 02/26/1951 DOA: 02/08/2016 PCP: [name], MD  Outpatient Specialists:   Advanced CHF team  Brief Narrative:  65 y/o ? OSA on CPAP ? 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 Card cath 12/16 Hemodynamics (mmHg) RA mean 13 RV 37/13 PA 36/14, mean 25 PCWP mean 21  Oxygen saturations: PA 65% AO 98%  Cardiac Output (Fick) 6.26  Cardiac Index (Fick) 2.21 Hepatitis Toe ulcerations luminectomy 1985 Episodic SVT   Assessment & Plan:   Active Problems:   Hyperlipidemia   Aortic stenosis   PAF (paroxysmal atrial fibrillation) (HCC)   Chronic diastolic CHF (congestive heart failure) (HCC)   OSA on CPAP   Insulin dependent diabetes mellitus (HCC)   Obesity hypoventilation syndrome (HCC)   Acute renal failure superimposed on stage 3 chronic kidney disease (HCC)   Diarrhea   Dehydration   Acute-on-chronic kidney injury (HCC)   Chronic diastolic CHF (congestive heart failure) (HCC) - evidence of dehydration initially-  resume torsemide at a dose of 80 mg every morning 02/11/2016 Consultant advanced heart failure team for recommendations Discontinue all IV fluids 02/10/2016   . Acute renal failure superimposed on pre-existing cardiorenal syndrome and a setting of on stage 3 chronic kidney disease (HCC) , baseline 1.5, 2.28 >1.8 improving .   diarrhea-completely resolved now actually needed a laxative 02/10/2016    . Hyperlipidemia -stable continue home medication  . Obesity hypoventilation syndrome (HCC) on oxygen at home will continue- is scheduled for sleep study stone and this may benefit him in terms of managing his volume and elevated PA pressures   . OSA on CPAP have been compliant will reorder-  . PAF (paroxysmal atrial fibrillation) (HCC) - patient denies history of atrial fibrillation not on any anticoagulation history father clarified   .  Diarrhea obtain stool studies, likely viral gastroenteritis, C. difficile negative , GI pathogen panel   . Dehydration -likely secondary to diarrhea will rehydrate  Diabetes mellitus continue Levemir twice a day, start patient on sliding scale insulin   lower extremities appear to be cyanotic we'll check ABIs , Venous Doppler Pending  DVT prophylaxsis Lovenox   DVT prophylaxis: Lovenox Code Status: Full Family Communication: none Disposition Plan: home   Consultants:   Cardiology  Procedures:   VQ  Doppler  Antimicrobials:   vanc 02/08/16  Zosyn 02/08/16    Subjective:  Hungry Needs more food Jovial Worried about volume Base weight 367 at [facility] clinic Currently 390 Requests ambien, oxy    Objective: Filed Vitals:   02/09/16 0951 02/09/16 2001 02/09/16 2257 02/10/16 0641  BP: 109/58 141/72  119/55  Pulse: 94 89 91 81  Temp: 98.3 F (36.8 C) 98.3 F (36.8 C)  97.7 F (36.5 C)  TempSrc: Oral Oral  Oral  Resp: 16 16 19 20   Height:      Weight:    180.033 kg (396 lb 14.4 oz)  SpO2: 91% 96% 96% 99%    Intake/Output Summary (Last 24 hours) at 02/10/16 1250 Last data filed at 02/10/16 1154  Gross per 24 hour  Intake   1812 ml  Output   3250 ml  Net  -1438 ml   Filed Weights   02/08/16 1337 02/08/16 2330 02/10/16 0641  Weight: 170.099 kg (375 lb) 179.035 kg (394 lb 11.2 oz) 180.033 kg (396 lb 14.4 oz)    Examination:  General exam: Appears calm and comfortable  Respiratory  system: Clear to auscultation. Respiratory effort normal. Cardiovascular system: S1 & S2 heard, RRR. Cannot assess JVD,  Cannot assess murmurs, rubs, gallops or clicks. . Gastrointestinal system: Abdomen is obese Central nervous system: Alert and oriented. No focal neurological deficits. Extremities: Symmetric 5 x 5 power. Skin: 3+ edema + anasarca Psychiatry: Judgement and insight appear normal. Mood & affect appropriate.     Data Reviewed: I have personally reviewed  following labs and imaging studies  CBC:  Recent Labs Lab 02/08/16 1513 02/09/16 0439 02/10/16 0515  WBC 13.7* 8.7 8.7  NEUTROABS 7.9*  --   --   HGB 13.5 11.5* 11.8*  HCT 44.0 38.5* 39.4  MCV 87.8 88.5 87.6  PLT 288 251 258   Basic Metabolic Panel:  Recent Labs Lab 02/08/16 1513 02/09/16 0439 02/10/16 0515  NA 142 141 142  K 4.6 4.5 4.2  CL 94* 98* 101  CO2 34* 32 31  GLUCOSE 88 261* 201*  BUN 55* 47* 30*  CREATININE 2.28* 1.80* 1.54*  CALCIUM 9.4 8.4* 8.9  MG  --  2.3  --   PHOS  --  4.3  --    GFR: Estimated Creatinine Clearance: 79.3 mL/min (by C-G formula based on Cr of 1.54). Liver Function Tests:  Recent Labs Lab 02/08/16 1513 02/09/16 0439 02/10/16 0515  AST 22 18 15   ALT 16* 13* 15*  ALKPHOS 72 56 62  BILITOT 0.5 0.6 0.7  PROT 7.1 5.9* 6.4*  ALBUMIN 3.6 2.9* 3.3*   No results for input(s): LIPASE, AMYLASE in the last 168 hours. No results for input(s): AMMONIA in the last 168 hours. Coagulation Profile: No results for input(s): INR, PROTIME in the last 168 hours. Cardiac Enzymes: No results for input(s): CKTOTAL, CKMB, CKMBINDEX, TROPONINI in the last 168 hours. BNP (last 3 results) No results for input(s): PROBNP in the last 8760 hours. HbA1C:  Recent Labs  02/09/16 0439  HGBA1C 10.0*   CBG:  Recent Labs Lab 02/09/16 1953 02/10/16 0058 02/10/16 0450 02/10/16 0754 02/10/16 1211  GLUCAP 279* 216* 180* 136* 197*   Lipid Profile: No results for input(s): CHOL, HDL, LDLCALC, TRIG, CHOLHDL, LDLDIRECT in the last 72 hours. Thyroid Function Tests:  Recent Labs  02/09/16 0439  TSH 3.707   Anemia Panel: No results for input(s): VITAMINB12, FOLATE, FERRITIN, TIBC, IRON, RETICCTPCT in the last 72 hours. Urine analysis:    Component Value Date/Time   COLORURINE YELLOW 02/08/2016 1514   APPEARANCEUR CLOUDY* 02/08/2016 1514   LABSPEC 1.010 02/08/2016 1514   PHURINE 5.0 02/08/2016 1514   GLUCOSEU NEGATIVE 02/08/2016 1514   HGBUR  NEGATIVE 02/08/2016 1514   HGBUR negative 06/12/2007 0843   BILIRUBINUR NEGATIVE 02/08/2016 1514   KETONESUR NEGATIVE 02/08/2016 1514   PROTEINUR NEGATIVE 02/08/2016 1514   UROBILINOGEN 0.2 06/15/2015 0029   NITRITE NEGATIVE 02/08/2016 1514   LEUKOCYTESUR NEGATIVE 02/08/2016 1514   Sepsis Labs: @LABRCNTIP (procalcitonin:4,lacticidven:4)  ) Recent Results (from the past 240 hour(s))  Blood Culture (routine x 2)     Status: None (Preliminary result)   Collection Time: 02/08/16  2:57 PM  Result Value Ref Range Status   Specimen Description BLOOD RIGHT ANTECUBITAL  Final   Special Requests BOTTLES DRAWN AEROBIC AND ANAEROBIC 5CC  Final   Culture NO GROWTH < 24 HOURS  Final   Report Status PENDING  Incomplete  Blood Culture (routine x 2)     Status: None (Preliminary result)   Collection Time: 02/08/16  3:02 PM  Result Value Ref Range Status  Specimen Description BLOOD LEFT ANTECUBITAL  Final   Special Requests BOTTLES DRAWN AEROBIC AND ANAEROBIC 5CC  Final   Culture NO GROWTH < 24 HOURS  Final   Report Status PENDING  Incomplete  Urine culture     Status: None   Collection Time: 02/08/16  3:14 PM  Result Value Ref Range Status   Specimen Description URINE, CLEAN CATCH  Final   Special Requests NONE  Final   Culture MULTIPLE SPECIES PRESENT, SUGGEST RECOLLECTION  Final   Report Status 02/10/2016 FINAL  Final  MRSA PCR Screening     Status: None   Collection Time: 02/09/16  1:45 AM  Result Value Ref Range Status   MRSA by PCR NEGATIVE NEGATIVE Final    Comment:        The GeneXpert MRSA Assay (FDA approved for NASAL specimens only), is one component of a comprehensive MRSA colonization surveillance program. It is not intended to diagnose MRSA infection nor to guide or monitor treatment for MRSA infections.   C difficile quick scan w PCR reflex     Status: None   Collection Time: 02/09/16  8:14 AM  Result Value Ref Range Status   C Diff antigen NEGATIVE NEGATIVE Final    C Diff toxin NEGATIVE NEGATIVE Final   C Diff interpretation Negative for toxigenic C. difficile  Final         Radiology Studies: Dg Chest Portable 1 View  02/08/2016  CLINICAL DATA:  Low blood pressure on routine office visit. History of renal failure. EXAM: PORTABLE CHEST 1 VIEW COMPARISON:  01/02/2016. FINDINGS: Low lung volumes. Cardiomegaly. Prior CABG. Bibasilar atelectasis without focal infiltrates or overt failure. Mild vascular congestion. No pneumothorax. Small BILATERAL effusions. IMPRESSION: Low lung volumes with cardiomegaly, bibasilar atelectasis, and mild vascular congestion. No overt failure. Similar appearance to priors. Electronically Signed   By: Elsie Stain M.D.   On: 02/08/2016 15:06        Scheduled Meds: . aspirin EC  81 mg Oral Daily  . atorvastatin  40 mg Oral q1800  . colchicine  0.6 mg Oral Daily  . enoxaparin (LOVENOX) injection  90 mg Subcutaneous Q24H  . febuxostat  40 mg Oral Daily  . fenofibrate  54 mg Oral Daily  . insulin aspart  0-15 Units Subcutaneous 6 times per day  . insulin detemir  30 Units Subcutaneous Daily  . insulin detemir  35 Units Subcutaneous QHS  . piperacillin-tazobactam (ZOSYN)  IV  3.375 g Intravenous Q8H  . pneumococcal 23 valent vaccine  0.5 mL Intramuscular Tomorrow-1000  . sodium chloride flush  3 mL Intravenous Q12H  . vancomycin  1,750 mg Intravenous Q24H   Continuous Infusions: . sodium chloride 75 mL/hr at 02/09/16 1236     LOS: 1 day    Time spent: 31    Pleas Koch, MD [facility] ([facility]   If 7PM-7AM, please contact night-coverage [email or web address] Password [email or web address] 02/10/2016, 12:50 PM

## 2016-02-10 NOTE — Progress Notes (Signed)
Inpatient Diabetes Program Recommendations  AACE/ADA: New Consensus Statement on Inpatient Glycemic Control (2015)  Target Ranges:  Prepandial:   less than 140 mg/dL      Peak postprandial:   less than 180 mg/dL (1-2 hours)      Critically ill patients:  140 - 180 mg/dL   Review of Glycemic Control   Inpatient Diabetes Program Recommendations:  Correction (SSI): change Novolog to TID + HS scale per Glycemic Control order-set Diet: add carb modified to current diet Thank you  Ari Engelbrecht BSN, RN,CDE Inpatient Diabetes Coordinator [phone] (team pager)    

## 2016-02-10 NOTE — Evaluation (Signed)
Physical Therapy Evaluation Patient Details Name: Antonio [name] MRN: [id number] DOB: 1951-03-10 Today's Date: 02/10/2016   History of Present Illness  Patient is a 65 yo male admitted 02/08/16 with diarrhea, dehydration, hypotension, acute renal failure, aortic stenosis.   PMH:  morbid obesity, aortic stenosis, PAF, OSA on CPAP, HLD, CHF, DM, CKD, anxiety  Clinical Impression  Patient presents with problems listed below.  Will benefit from acute PT to maximize functional independence prior to return home. Recommend patient continues HHPT at discharge.    Follow Up Recommendations Home health PT;Supervision - Intermittent    Equipment Recommendations  None recommended by PT    Recommendations for Other Services       Precautions / Restrictions Precautions Precautions: None Restrictions Weight Bearing Restrictions: No      Mobility  Bed Mobility Overal bed mobility: Modified Independent             General bed mobility comments: Increased time  Transfers Overall transfer level: Needs assistance Equipment used: Rolling walker (2 wheeled) Transfers: Sit to/from Stand Sit to Stand: Supervision         General transfer comment: Assist for safety  Ambulation/Gait Ambulation/Gait assistance: Min guard Ambulation Distance (Feet): 30 Feet Assistive device: Rolling walker (2 wheeled) Gait Pattern/deviations: Step-through pattern;Decreased stride length Gait velocity: decreased Gait velocity interpretation: Below normal speed for age/gender General Gait Details: Verbal cues for safe use of RW.  Stairs            Wheelchair Mobility    Modified Rankin (Stroke Patients Only)       Balance                                             Pertinent Vitals/Pain Pain Assessment: No/denies pain    Home Living Family/patient expects to be discharged to:: Private residence Living Arrangements: Alone Available Help at Discharge: Family;Available  PRN/intermittently Type of Home: House Home Access: Stairs to enter Entrance Stairs-Rails: Right Entrance Stairs-Number of Steps: 3 Home Layout: One level Home Equipment: Walker - 2 wheels;Walker - 4 wheels;Shower seat;Bedside commode Occupational psychologist)      Prior Function Level of Independence: Independent with assistive device(s)               Hand Dominance   Dominant Hand: Right    Extremity/Trunk Assessment   Upper Extremity Assessment: Overall WFL for tasks assessed           Lower Extremity Assessment: Generalized weakness (Significant edema BLE's)         Communication   Communication: No difficulties  Cognition Arousal/Alertness: Awake/alert Behavior During Therapy: WFL for tasks assessed/performed Overall Cognitive Status: Within Functional Limits for tasks assessed                      General Comments      Exercises        Assessment/Plan    PT Assessment Patient needs continued PT services  PT Diagnosis Difficulty walking;Generalized weakness   PT Problem List Decreased strength;Decreased activity tolerance;Decreased balance;Decreased mobility;Decreased knowledge of use of DME;Cardiopulmonary status limiting activity;Obesity  PT Treatment Interventions DME instruction;Gait training;Stair training;Functional mobility training;Therapeutic activities;Therapeutic exercise;Patient/family education   PT Goals (Current goals can be found in the Care Plan section) Acute Rehab PT Goals Patient Stated Goal: To return home PT Goal Formulation: With patient Time For Goal  Achievement: 02/17/16 Potential to Achieve Goals: Good    Frequency Min 3X/week   Barriers to discharge Decreased caregiver support Patient lives alone    Co-evaluation               End of Session Equipment Utilized During Treatment: Oxygen Activity Tolerance: Patient limited by fatigue Patient left: in chair;with call bell/phone within reach;with  nursing/sitter in room Nurse Communication: Mobility status         Time: 1319-1330 PT Time Calculation (min) (ACUTE ONLY): 11 min   Charges:   PT Evaluation $PT Eval Moderate Complexity: 1 Procedure     PT G CodesVena Austria 03/10/16, 4:05 PM Durenda Hurt. Renaldo Fiddler, [facility] Pager [phone]

## 2016-02-10 NOTE — Progress Notes (Signed)
VASCULAR LAB PRELIMINARY  PRELIMINARY  PRELIMINARY  PRELIMINARY  VASCULAR LAB PRELIMINARY  ARTERIAL  ABI completed:    RIGHT    LEFT    PRESSURE WAVEFORM  PRESSURE WAVEFORM  BRACHIAL Iv site  Triphasic BRACHIAL 146 Triphasic  DP 226 Biphasic DP 159 Biphasic  PT 245 Triphasic PT 163 Biphasic    RIGHT LEFT  ABI 1.68 1.12    Suggesting ABI's bilaterally-appears normal, this is most likely false elevated due to calcified vessels.  Jenetta Loges, RVT 02/10/2016, 2:10 PM

## 2016-02-11 DIAGNOSIS — I5033 Acute on chronic diastolic (congestive) heart failure: Secondary | ICD-10-CM

## 2016-02-11 LAB — BASIC METABOLIC PANEL
Anion gap: 10 (ref 5–15)
BUN: 23 mg/dL — AB (ref 6–20)
CHLORIDE: 102 mmol/L (ref 101–111)
CO2: 29 mmol/L (ref 22–32)
CREATININE: 1.31 mg/dL — AB (ref 0.61–1.24)
Calcium: 9 mg/dL (ref 8.9–10.3)
GFR, EST NON AFRICAN AMERICAN: 56 mL/min — AB (ref >60)
Glucose, Bld: 141 mg/dL — ABNORMAL HIGH (ref 65–99)
Potassium: 4.3 mmol/L (ref 3.5–5.1)
SODIUM: 141 mmol/L (ref 135–145)

## 2016-02-11 LAB — GLUCOSE, CAPILLARY
GLUCOSE-CAPILLARY: 133 mg/dL — AB (ref 65–99)
Glucose-Capillary: 123 mg/dL — ABNORMAL HIGH (ref 65–99)
Glucose-Capillary: 126 mg/dL — ABNORMAL HIGH (ref 65–99)
Glucose-Capillary: 153 mg/dL — ABNORMAL HIGH (ref 65–99)
Glucose-Capillary: 257 mg/dL — ABNORMAL HIGH (ref 65–99)

## 2016-02-11 LAB — CBC
HCT: 37.1 % — ABNORMAL LOW (ref 39.0–52.0)
Hemoglobin: 11.8 g/dL — ABNORMAL LOW (ref 13.0–17.0)
MCH: 27.6 pg (ref 26.0–34.0)
MCHC: 31.8 g/dL (ref 30.0–36.0)
MCV: 86.7 fL (ref 78.0–100.0)
PLATELETS: 261 10*3/uL (ref 150–400)
RBC: 4.28 MIL/uL (ref 4.22–5.81)
RDW: 15.5 % (ref 11.5–15.5)
WBC: 9 10*3/uL (ref 4.0–10.5)

## 2016-02-11 MED ORDER — TORSEMIDE 20 MG PO TABS
40.0000 mg | ORAL_TABLET | Freq: Two times a day (BID) | ORAL | Status: DC
  Administered 2016-02-11 – 2016-02-12 (×3): 40 mg via ORAL
  Filled 2016-02-11 (×3): qty 2

## 2016-02-11 MED ORDER — TORSEMIDE 20 MG PO TABS: 20.0000 mg | ORAL_TABLET | Freq: Two times a day (BID) | ORAL | Status: DC

## 2016-02-11 NOTE — Consult Note (Signed)
Reason for Consult: Acute on chronic diastolic heart failure  Referring Physician: Dr. Melanee Left is an 65 y.o. male.   HPI: The patient is a 65 yo man with chronic diastolic heart failure, s/p AVR and morbid obesity, who was having diarrhea for about 2 weeks. He presented to his Primary MD and was noted to be hypotensive with near syncope. He was taken to the ED and was given several liters of fluid. He has gained 20 lbs since admit to the hospital. Prior to his presentation his perpheral edema was much improved. He denies medical non-complianced. No chest pain and dyspnea has been quiet.   PMH: Past Medical History  Diagnosis Date  . Blindness of right eye     a. due to amblyopia as child  . Hx MRSA infection     a. thigh abcess 2006  . Anxiety   . Aortic stenosis     a. s/p AVR 11/29/11  . HTN (hypertension)   . HLD (hyperlipidemia)   . Gout   . Chronic diastolic heart failure (HCC)     a. Echo 08/11/2015 LVEF 55-60% with normal RV function  . DM2 (diabetes mellitus, type 2) (HCC)   . OSA on CPAP   . Hepatitis   . Arthritis   . History of acute renal failure     PSHX: Past Surgical History  Procedure Laterality Date  . Lumbar laminectomy  10/30/1983  . Aortic valve replacement  11/29/2011    Procedure: AORTIC VALVE REPLACEMENT (AVR);  Surgeon: [name], MD;  Location: [facility] OR;  Service: Open Heart Surgery;  Laterality: N/A;  with nitric oxide  . Right heart catheterization N/A 11/28/2011    Procedure: RIGHT HEART CATH;  Surgeon: [name], MD;  Location: [facility] CATH LAB;  Service: Cardiovascular;  Laterality: N/A;  . Cardiac valve replacement    . Incision and drainage abscess Left 08/2005    "upper/inner thigh; that's where the MRSA was"  . Back surgery    . Cardiac catheterization    . Cardiac catheterization N/A 10/14/2015    Procedure: Right Heart Cath;  Surgeon: [name], MD;  Location: [facility] INVASIVE CV LAB;  Service:  Cardiovascular;  Laterality: N/A;    FAMHX: Family History  Problem Relation Age of Onset  . Heart attack Father   . Cancer Mother     Social History:  reports that he has never smoked. He has never used smokeless tobacco. He reports that he drinks alcohol. He reports that he does not use illicit drugs.  Allergies:  Allergies  Allergen Reactions  . Other Other (See Comments)    Salt causes "extreme" edema.    Medications: I have reviewed the patient's current medications.  No results found.  ROS  As stated in the HPI and negative for all other systems.  Physical Exam  Vitals:Blood pressure 143/69, pulse 92, temperature 97.9 F (36.6 C), temperature source Oral, resp. rate 18, height  (1.803 m), weight 393 lb 4.8 oz (178.4 kg), SpO2 98 %.  Morbidly obese appearing middle aged man, NAD HEENT: Unremarkable Neck:  7 cm JVD, no thyromegally Lymphatics:  No adenopathy Back:  No CVA tenderness Lungs:  Clear except for basilar rales HEART:  Regular rate rhythm, no murmurs, no rubs, no clicks Abd:  Flat, positive bowel sounds, no organomegally, no rebound, no guarding Ext:  2 plus pulses, 2+ edema bilaterally, no cyanosis, no clubbing Skin:  No rashes no nodules  Neuro:  CN II through XII intact, motor grossly intact  ECG - nsr with RBBB  CXR - reviewed  Assessment/Plan: 1. Acute diastolic heart failure - he was initially dry and now is wet based on his exam. He will need continued diuresis. I would guess a good dry weight for him would be around 380. Continue torsemide. I will uptitrate. 2. Diarrhea -- this has resolved. 3. HTN - his blood pressure is nearly normal. Will follow.  Sharlot Gowda TaylorMD 02/11/2016, 1:58 PM

## 2016-02-11 NOTE — Progress Notes (Signed)
Patient stated during first attempt to place CPAP on to not wake him uld call if he wanted to wear it. Patient was asleep when RT returned.

## 2016-02-11 NOTE — Progress Notes (Signed)
PROGRESS NOTE    Antonio [name]  ZOX:[id number] DOB: 12/29/1950 DOA: 02/08/2016 PCP: [name], MD  Outpatient Specialists:   Cardiology ACHF team  Brief Narrative:  65 y/o ? OSA on CPAP ? 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  Subjective:  Well No issue Swelling ofR gr8 toe is normal No pains ++ Diuresis  Assessment & Plan:   Active Problems:   Hyperlipidemia   Aortic stenosis   PAF (paroxysmal atrial fibrillation) (HCC)   Chronic diastolic CHF (congestive heart failure) (HCC)   OSA on CPAP   Insulin dependent diabetes mellitus (HCC)   Obesity hypoventilation syndrome (HCC)   Acute renal failure superimposed on stage 3 chronic kidney disease (HCC)   Diarrhea   Dehydration   Acute-on-chronic kidney injury (HCC)   Acute on chronic diastolic HF (heart failure) (HCC)   Chronic diastolic CHF (congestive heart failure) (HCC) - evidence of dehydration initially-  resume torsemide at a dose of 80 mg every morning 02/11/2016 Consulted Cardiology for Med recs -4.2 liters so far wght 396-->393  . Acute renal failure superimposed on pre-existing cardiorenal syndrome and a setting of on stage 3 chronic kidney disease (HCC) ,  baseline 1.5, 2.28 >1.8 improving .  initially vol resuscitated, now diuresing Labs am   diarrhea-completely resolved now actually needed a laxative 02/10/2016  . Hyperlipidemia  -stable continue home medication  . Obesity hypoventilation syndrome (HCC) on oxygen at home will continue-  is scheduled for sleep study stone and this may benefit him in terms of managing his volume and elevated PA pressures     . PAF (paroxysmal atrial fibrillation) (HCC) PSVT  patient denies history of atrial fibrillation not on any anticoagulation history father clarified   . Diarrhea obtain stool studies, likely viral gastroenteritis, C. difficile negative , GI pathogen panel   . Dehydration -likely secondary to diarrhea , resolved   Diabetes mellitus  continue Levemir 30 am, 35 U pm , start patient on sliding scale insulin -good control   lower extremities appear to be cyanotic -2/2 to bullae from volume.  Will resolve with diuresis   Super morbid obesity Body mass index is 54.88 kg/(m^2). We focused 02/11/2016 on ?Bariatric options.  He has lost upto 70 lbs before     DVT prophylaxsis Lovenox   DVT prophylaxis: Lovenox Code Status: Full Family Communication: none Disposition Plan: home when at dry weight   Consultants:   Cardiology  Procedures:   VQ   Antimicrobials:   vanc 02/08/16-->4.14  Zosyn 02/08/16 -->4.14   Objective: Filed Vitals:   02/10/16 1714 02/10/16 2012 02/11/16 0648 02/11/16 1221  BP: 120/63 143/83 152/90 143/69  Pulse: 90 88 96 92  Temp: 97.7 F (36.5 C) 98.1 F (36.7 C) 98 F (36.7 C) 97.9 F (36.6 C)  TempSrc:  Oral Oral Oral  Resp: 18 18 18 18   Height:      Weight:   178.4 kg (393 lb 4.8 oz)   SpO2: 98% 95% 97% 98%    Intake/Output Summary (Last 24 hours) at 02/11/16 1547 Last data filed at 02/11/16 1300  Gross per 24 hour  Intake    561 ml  Output  4600 ml  Net  -4039 ml   Filed Weights   02/08/16 2330 02/10/16 0641 02/11/16 0648  Weight: 179.035 kg (394 lb 11.2 oz) 180.033 kg (396 lb 14.4 oz) 178.4 kg (393 lb 4.8 oz)    Examination:  General exam: Comfortable  Respiratory system: Clear to auscultation. Respiratory effort normal. Cardiovascular system: S1 & S2 heard, RRR. Cannot assess JVD,  Cannot assess murmurs, rubs, gallops or clicks. . Gastrointestinal system: Abdomen is obese Central nervous system: Alert and oriented.  No focal neurological deficits. Extremities: Symmetric 5 x 5 power. Skin: 3+ edema + anasarca Psychiatry: Judgement and insight appear normal. Mood & affect appropriate.     Data Reviewed: I have personally reviewed following labs and imaging studies  CBC:  Recent Labs Lab 02/08/16 1513 02/09/16 0439 02/10/16 0515 02/11/16 0218  WBC 13.7* 8.7 8.7 9.0  NEUTROABS 7.9*  --   --   --   HGB 13.5 11.5* 11.8* 11.8*  HCT 44.0 38.5* 39.4 37.1*  MCV 87.8 88.5 87.6 86.7  PLT 288 251 258 261   Basic Metabolic Panel:  Recent Labs Lab 02/08/16 1513 02/09/16 0439 02/10/16 0515 02/11/16 0218  NA 142 141 142 141  K 4.6 4.5 4.2 4.3  CL 94* 98* 101 102  CO2 34* 32 31 29  GLUCOSE 88 261* 201* 141*  BUN 55* 47* 30* 23*  CREATININE 2.28* 1.80* 1.54* 1.31*  CALCIUM 9.4 8.4* 8.9 9.0  MG  --  2.3  --   --   PHOS  --  4.3  --   --    GFR: Estimated Creatinine Clearance: 92.6 mL/min (by C-G formula based on Cr of 1.31). Liver Function Tests:  Recent Labs Lab 02/08/16 1513 02/09/16 0439 02/10/16 0515  AST ALT 16* 13* 15*  ALKPHOS 72 56 62  BILITOT 0.5 0.6 0.7  PROT 7.1 5.9* 6.4*  ALBUMIN 3.6 2.9* 3.3*   No results for input(s): LIPASE, AMYLASE in the last 168 hours. No results for input(s): AMMONIA in the last 168 hours. Coagulation Profile: No results for input(s): INR, PROTIME in the last 168 hours. Cardiac Enzymes: No results for input(s): CKTOTAL, CKMB, CKMBINDEX, TROPONINI in the last 168 hours. BNP (last 3 results) No results for input(s): PROBNP in the last 8760 hours. HbA1C:  Recent Labs  02/09/16 0439  HGBA1C 10.0*   CBG:  Recent Labs Lab 02/10/16 2009 02/10/16 2342 02/11/16 0456 02/11/16 0744 02/11/16 1220  GLUCAP 250* 163* 123* 126* 133*   Lipid Profile: No results for input(s): CHOL, HDL, LDLCALC, TRIG, CHOLHDL, LDLDIRECT in the last 72 hours. Thyroid Function Tests:  Recent Labs  02/09/16 0439  TSH 3.707   Anemia Panel: No  results for input(s): VITAMINB12, FOLATE, FERRITIN, TIBC, IRON, RETICCTPCT in the last 72 hours. Urine analysis:    Component Value Date/Time   COLORURINE YELLOW 02/08/2016 1514   APPEARANCEUR CLOUDY* 02/08/2016 1514   LABSPEC 1.010 02/08/2016 1514   PHURINE 5.0 02/08/2016 1514   GLUCOSEU NEGATIVE 02/08/2016 1514   HGBUR NEGATIVE 02/08/2016 1514   HGBUR negative 06/12/2007 0843   BILIRUBINUR NEGATIVE 02/08/2016 1514   KETONESUR NEGATIVE 02/08/2016 1514   PROTEINUR NEGATIVE 02/08/2016 1514   UROBILINOGEN 0.2 06/15/2015 0029   NITRITE NEGATIVE 02/08/2016 1514   LEUKOCYTESUR NEGATIVE 02/08/2016 1514   Sepsis Labs: (procalcitonin:4,lacticidven:4)  ) Recent Results (from the past 240 hour(s))  Blood Culture (routine x 2)     Status: None (Preliminary result)   Collection Time: 02/08/16  2:57 PM  Result Value Ref Range Status   Specimen Description BLOOD RIGHT ANTECUBITAL  Final   Special Requests BOTTLES DRAWN AEROBIC AND ANAEROBIC 5CC  Final   Culture NO GROWTH 2 DAYS  Final   Report Status PENDING  Incomplete  Blood Culture (routine x 2)     Status: None (Preliminary result)   Collection Time: 02/08/16  3:02 PM  Result Value Ref Range Status   Specimen Description BLOOD LEFT ANTECUBITAL  Final   Special Requests BOTTLES DRAWN AEROBIC AND ANAEROBIC 5CC  Final   Culture NO GROWTH 2 DAYS  Final   Report Status PENDING  Incomplete  Urine culture     Status: None   Collection Time: 02/08/16  3:14 PM  Result Value Ref Range Status   Specimen Description URINE, CLEAN CATCH  Final   Special Requests NONE  Final   Culture MULTIPLE SPECIES PRESENT, SUGGEST RECOLLECTION  Final   Report Status 02/10/2016 FINAL  Final  MRSA PCR Screening     Status: None   Collection Time: 02/09/16  1:45 AM  Result Value Ref Range Status   MRSA by PCR NEGATIVE NEGATIVE Final    Comment:        The GeneXpert MRSA Assay (FDA approved for NASAL specimens only), is one component of  a comprehensive MRSA colonization surveillance program. It is not intended to diagnose MRSA infection nor to guide or monitor treatment for MRSA infections.   C difficile quick scan w PCR reflex     Status: None   Collection Time: 02/09/16  8:14 AM  Result Value Ref Range Status   C Diff antigen NEGATIVE NEGATIVE Final   C Diff toxin NEGATIVE NEGATIVE Final   C Diff interpretation Negative for toxigenic C. difficile  Final         Radiology Studies: No results found.      Scheduled Meds: . aspirin EC  81 mg Oral Daily  . atorvastatin  40 mg Oral q1800  . colchicine  0.6 mg Oral Daily  . enoxaparin (LOVENOX) injection  90 mg Subcutaneous Q24H  . febuxostat  40 mg Oral Daily  . fenofibrate  54 mg Oral Daily  . insulin aspart  0-15 Units Subcutaneous 6 times per day  . insulin detemir  30 Units Subcutaneous Daily  . insulin detemir  35 Units Subcutaneous QHS  . sodium chloride flush  3 mL Intravenous Q12H  . torsemide  40 mg Oral BID  . zolpidem  5 mg Oral QHS   Continuous Infusions:     LOS: 2 days    Time spent: 31    Pleas Koch, MD [facility] ([phone]   If 7PM-7AM, please contact night-coverage [email or web address] Password [facility] 02/11/2016, 3:47 PM

## 2016-02-12 DIAGNOSIS — N179 Acute kidney failure, unspecified: Secondary | ICD-10-CM

## 2016-02-12 DIAGNOSIS — N183 Chronic kidney disease, stage 3 (moderate): Secondary | ICD-10-CM

## 2016-02-12 LAB — COMPREHENSIVE METABOLIC PANEL
ALT: 15 U/L — AB (ref 17–63)
AST: 16 U/L (ref 15–41)
Albumin: 3.1 g/dL — ABNORMAL LOW (ref 3.5–5.0)
Alkaline Phosphatase: 57 U/L (ref 38–126)
Anion gap: 11 (ref 5–15)
BUN: 28 mg/dL — AB (ref 6–20)
CHLORIDE: 96 mmol/L — AB (ref 101–111)
CO2: 33 mmol/L — AB (ref 22–32)
CREATININE: 1.55 mg/dL — AB (ref 0.61–1.24)
Calcium: 8.7 mg/dL — ABNORMAL LOW (ref 8.9–10.3)
GFR calc Af Amer: 53 mL/min — ABNORMAL LOW (ref >60)
GFR calc non Af Amer: 45 mL/min — ABNORMAL LOW (ref >60)
Glucose, Bld: 187 mg/dL — ABNORMAL HIGH (ref 65–99)
Potassium: 3.7 mmol/L (ref 3.5–5.1)
SODIUM: 140 mmol/L (ref 135–145)
Total Bilirubin: 0.5 mg/dL (ref 0.3–1.2)
Total Protein: 6.1 g/dL — ABNORMAL LOW (ref 6.5–8.1)

## 2016-02-12 LAB — GLUCOSE, CAPILLARY
GLUCOSE-CAPILLARY: 114 mg/dL — AB (ref 65–99)
GLUCOSE-CAPILLARY: 207 mg/dL — AB (ref 65–99)
GLUCOSE-CAPILLARY: 237 mg/dL — AB (ref 65–99)
Glucose-Capillary: 147 mg/dL — ABNORMAL HIGH (ref 65–99)
Glucose-Capillary: 238 mg/dL — ABNORMAL HIGH (ref 65–99)
Glucose-Capillary: 312 mg/dL — ABNORMAL HIGH (ref 65–99)
Glucose-Capillary: 204 mg/dL — ABNORMAL HIGH (ref 65–99)

## 2016-02-12 NOTE — Progress Notes (Signed)
Patient ID: LEGEN EICHEL, male   DOB: 04-Jan-1951, 65 y.o.   MRN: [id number]    Patient Name: Antonio [name] Date of Encounter: 02/12/2016     Active Problems:   Hyperlipidemia   Aortic stenosis   PAF (paroxysmal atrial fibrillation) (HCC)   Chronic diastolic CHF (congestive heart failure) (HCC)   OSA on CPAP   Insulin dependent diabetes mellitus (HCC)   Obesity hypoventilation syndrome (HCC)   Acute renal failure superimposed on stage 3 chronic kidney disease (HCC)   Diarrhea   Dehydration   Acute-on-chronic kidney injury (HCC)   Acute on chronic diastolic HF (heart failure) (HCC)    SUBJECTIVE  Sleeping comfortably, did not waken despite multiple attempts.   CURRENT MEDS . aspirin EC  81 mg Oral Daily  . atorvastatin  40 mg Oral q1800  . colchicine  0.6 mg Oral Daily  . enoxaparin (LOVENOX) injection  90 mg Subcutaneous Q24H  . febuxostat  40 mg Oral Daily  . fenofibrate  54 mg Oral Daily  . insulin aspart  0-15 Units Subcutaneous 6 times per day  . insulin detemir  30 Units Subcutaneous Daily  . insulin detemir  35 Units Subcutaneous QHS  . sodium chloride flush  3 mL Intravenous Q12H  . torsemide  40 mg Oral BID  . zolpidem  5 mg Oral QHS    OBJECTIVE  Filed Vitals:   02/11/16 0648 02/11/16 1221 02/11/16 2042 02/12/16 0443  BP: 152/90 143/69 113/88 126/64  Pulse: 96 92 90 89  Temp: 98 F (36.7 C) 97.9 F (36.6 C) 98.5 F (36.9 C) 98 F (36.7 C)  TempSrc: Oral Oral Oral Oral  Resp: 18 18 18 18   Height:      Weight: 393 lb 4.8 oz (178.4 kg)   390 lb 4.8 oz (177.039 kg)  SpO2: 97% 98% 93% 93%    Intake/Output Summary (Last 24 hours) at 02/12/16 0803 Last data filed at 02/12/16 0500  Gross per 24 hour  Intake    923 ml  Output   3100 ml  Net  -2177 ml   Filed Weights   02/10/16 0641 02/11/16 0648 02/12/16 0443  Weight: 396 lb 14.4 oz (180.033 kg) 393 lb 4.8 oz (178.4 kg) 390 lb 4.8 oz (177.039 kg)    PHYSICAL EXAM  General: Pleasant, obese middle  age man, NAD. Neuro: Alert and oriented X 3. Moves all extremities spontaneously. Psych: Normal affect. HEENT:  Normal  Neck: Supple without bruits or JVD. Lungs:  Resp regular and unlabored, CTA. Heart: RRR no s3, s4, or murmurs. Abdomen: Soft, non-tender, non-distended, BS + x 4.  Extremities: No clubbing, cyanosis, 2+ peripheral edema. DP/PT/Radials 2+ and equal bilaterally.  Accessory Clinical Findings  CBC  Recent Labs  02/10/16 0515 02/11/16 0218  WBC 8.7 9.0  HGB 11.8* 11.8*  HCT 39.4 37.1*  MCV 87.6 86.7  PLT 258 261   Basic Metabolic Panel  Recent Labs  02/11/16 0218 02/12/16 0254  NA 141 140  K 4.3 3.7  CL 102 96*  CO2 29 33*  GLUCOSE 141* 187*  BUN 23* 28*  CREATININE 1.31* 1.55*  CALCIUM 9.0 8.7*   Liver Function Tests  Recent Labs  02/10/16 0515 02/12/16 0254  AST 15 16  ALT 15* 15*  ALKPHOS 62 57  BILITOT 0.7 0.5  PROT 6.4* 6.1*  ALBUMIN 3.3* 3.1*   No results for input(s): LIPASE, AMYLASE in the last 72 hours. Cardiac Enzymes No results for input(s): CKTOTAL,  CKMB, CKMBINDEX, TROPONINI in the last 72 hours. BNP Invalid input(s): POCBNP D-Dimer No results for input(s): DDIMER in the last 72 hours. Hemoglobin A1C No results for input(s): HGBA1C in the last 72 hours. Fasting Lipid Panel No results for input(s): CHOL, HDL, LDLCALC, TRIG, CHOLHDL, LDLDIRECT in the last 72 hours. Thyroid Function Tests No results for input(s): TSH, T4TOTAL, T3FREE, THYROIDAB in the last 72 hours.  Invalid input(s): FREET3  TELE  nsr  Radiology/Studies  Dg Chest Portable 1 View  02/08/2016  CLINICAL DATA:  Low blood pressure on routine office visit. History of renal failure. EXAM: PORTABLE CHEST 1 VIEW COMPARISON:  01/02/2016. FINDINGS: Low lung volumes. Cardiomegaly. Prior CABG. Bibasilar atelectasis without focal infiltrates or overt failure. Mild vascular congestion. No pneumothorax. Small BILATERAL effusions. IMPRESSION: Low lung volumes with  cardiomegaly, bibasilar atelectasis, and mild vascular congestion. No overt failure. Similar appearance to priors. Electronically Signed   By: Elsie Stain M.D.   On: 02/08/2016 15:06    ASSESSMENT AND PLAN  1. Acute on chronic diastolic heart failure - his weight is down another 3 lbs. He is still about 10-15 from euvolemia. Continue gentle diuresis. 2. Diarrhea - resolved. C/o constipation yesterday 3. Acute on chronic renal insuff. - he is stage 3-4. He will continue diuresis for now. Watch renal insuff.  Gregg Taylor,M.D.  02/12/2016 8:03 AM

## 2016-02-12 NOTE — Progress Notes (Signed)
Pt refused bed alarm, states he will notify RN if he needs help to bathroom. Pt A/Ox4

## 2016-02-12 NOTE — Progress Notes (Signed)
PROGRESS NOTE    Antonio [name]  XBD:[id number] DOB: Jul 24, 1951 DOA: 02/08/2016 PCP: [name], MD  Outpatient Specialists:   Cardiology ACHF team  Brief Narrative:  65 y/o ? OSA on CPAP ? 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  Subjective:  Doing ok Feels  alittle less swollen No cp No n/v tol diet No fever no chills  Assessment & Plan:   Active Problems:   Hyperlipidemia   Aortic stenosis   PAF (paroxysmal atrial fibrillation) (HCC)   Chronic diastolic CHF (congestive heart failure) (HCC)   OSA on CPAP   Insulin dependent diabetes mellitus (HCC)   Obesity hypoventilation syndrome (HCC)   Acute renal failure superimposed on stage 3 chronic kidney disease (HCC)   Diarrhea   Dehydration   Acute-on-chronic kidney injury (HCC)   Acute on chronic diastolic HF (heart failure) (HCC)   Chronic diastolic CHF (congestive heart failure) (HCC) - evidence of dehydration initially-  resume torsemide at a dose of 80 mg -->40 bid per cardiology 02/12/2016 -4.2 liters so far wght 396-->393-->390  . Acute renal failure superimposed on pre-existing cardiorenal syndrome and a setting of on stage 3 chronic kidney disease (HCC) ,  baseline 1.5, 2.28 >1.8>>1.55 improving .  initially vol resuscitated, now diuresing Labs am   diarrhea-completely resolved  . Hyperlipidemia  -stable continue home medication  . Obesity hypoventilation syndrome (HCC) on oxygen at home will continue-  is scheduled for sleep study stone and this may benefit him in terms of managing his volume and elevated PA pressures   . PAF (paroxysmal atrial fibrillation)  (HCC) PSVT  patient denies history of atrial fibrillation not on any anticoagulation history father clarified   . Diarrhea obtain stool studies, likely viral gastroenteritis, C. difficile negative , GI pathogen panel   . Dehydration -likely secondary to diarrhea , resolved   Diabetes mellitus  continue Levemir 30 am, 35 U pm , start patient on sliding scale insulin -good control   lower extremities appear to be cyanotic -2/2 to bullae from volume.  Will resolve with diuresis--consider Mag sulphate dressing to dry it up   Super morbid obesity Body mass index is 54.46 kg/(m^2). We focused 02/12/2016 on ?Bariatric options.  He has lost upto 70 lbs before He will benefit from further OP discussion     DVT prophylaxsis Lovenox   DVT prophylaxis: Lovenox Code Status: Full Family Communication: none Disposition Plan: home when at dry weight   Consultants:   Cardiology  Procedures:   VQ   Antimicrobials:   vanc 02/08/16-->4.14  Zosyn 02/08/16 -->4.14   Objective: Filed Vitals:   02/11/16 1221 02/11/16 2042 02/12/16 0443 02/12/16 1236  BP: 143/69 113/88 126/64 137/89  Pulse: 92 90 89 100  Temp: 97.9 F (36.6 C) 98.5 F (36.9 C) 98 F (36.7 C) 98 F (36.7 C)  TempSrc: Oral Oral Oral Oral  Resp: 18 18 18 18   Height:      Weight:   177.039 kg (390 lb 4.8 oz)   SpO2: 98% 93% 93% 93%    Intake/Output Summary (Last 24 hours) at 02/12/16 1603 Last data filed at 02/12/16 1300  Gross per 24 hour  Intake  720 ml  Output   1700 ml  Net   -980 ml   Filed Weights   02/10/16 0641 02/11/16 0648 02/12/16 0443  Weight: 180.033 kg (396 lb 14.4 oz) 178.4 kg (393 lb 4.8 oz) 177.039 kg (390 lb 4.8 oz)    Examination:  General exam: Comfortable  Respiratory system: Clear to auscultation. Respiratory effort normal. Cardiovascular system: S1 & S2 heard, RRR. Cannot assess JVD,  Cannot assess murmurs, rubs, gallops or clicks. . Gastrointestinal system: Abdomen is  obese Central nervous system: Alert and oriented. No focal neurological deficits. Extremities: Symmetric 5 x 5 power. Skin: 3+ edema + anasarca Psychiatry: Judgement and insight appear normal. Mood & affect appropriate.     Data Reviewed: I have personally reviewed following labs and imaging studies  CBC:  Recent Labs Lab 02/08/16 1513 02/09/16 0439 02/10/16 0515 02/11/16 0218  WBC 13.7* 8.7 8.7 9.0  NEUTROABS 7.9*  --   --   --   HGB 13.5 11.5* 11.8* 11.8*  HCT 44.0 38.5* 39.4 37.1*  MCV 87.8 88.5 87.6 86.7  PLT 288 251 258 261   Basic Metabolic Panel:  Recent Labs Lab 02/08/16 1513 02/09/16 0439 02/10/16 0515 02/11/16 0218 02/12/16 0254  NA 142 141 142 141 140  K 4.6 4.5 4.2 4.3 3.7  CL 94* 98* 101 102 96*  CO2 34* 32 31 29 33*  GLUCOSE 88 261* 201* 141* 187*  BUN 55* 47* 30* 23* 28*  CREATININE 2.28* 1.80* 1.54* 1.31* 1.55*  CALCIUM 9.4 8.4* 8.9 9.0 8.7*  MG  --  2.3  --   --   --   PHOS  --  4.3  --   --   --    GFR: Estimated Creatinine Clearance: 78 mL/min (by C-G formula based on Cr of 1.55). Liver Function Tests:  Recent Labs Lab 02/08/16 1513 02/09/16 0439 02/10/16 0515 02/12/16 0254  AST 22 18 15 16   ALT 16* 13* 15* 15*  ALKPHOS 72 56 62 57  BILITOT 0.5 0.6 0.7 0.5  PROT 7.1 5.9* 6.4* 6.1*  ALBUMIN 3.6 2.9* 3.3* 3.1*   No results for input(s): LIPASE, AMYLASE in the last 168 hours. No results for input(s): AMMONIA in the last 168 hours. Coagulation Profile: No results for input(s): INR, PROTIME in the last 168 hours. Cardiac Enzymes: No results for input(s): CKTOTAL, CKMB, CKMBINDEX, TROPONINI in the last 168 hours. BNP (last 3 results) No results for input(s): PROBNP in the last 8760 hours. HbA1C: No results for input(s): HGBA1C in the last 72 hours. CBG:  Recent Labs Lab 02/11/16 1612 02/11/16 2007 02/11/16 2357 02/12/16 0439 02/12/16 0806  GLUCAP 153* 257* 207* 147* 114*   Lipid Profile: No results for input(s): CHOL,  HDL, LDLCALC, TRIG, CHOLHDL, LDLDIRECT in the last 72 hours. Thyroid Function Tests: No results for input(s): TSH, T4TOTAL, FREET4, T3FREE, THYROIDAB in the last 72 hours. Anemia Panel: No results for input(s): VITAMINB12, FOLATE, FERRITIN, TIBC, IRON, RETICCTPCT in the last 72 hours. Urine analysis:    Component Value Date/Time   COLORURINE YELLOW 02/08/2016 1514   APPEARANCEUR CLOUDY* 02/08/2016 1514   LABSPEC 1.010 02/08/2016 1514   PHURINE 5.0 02/08/2016 1514   GLUCOSEU NEGATIVE 02/08/2016 1514   HGBUR NEGATIVE 02/08/2016 1514   HGBUR negative 06/12/2007 0843   BILIRUBINUR NEGATIVE 02/08/2016 1514   KETONESUR NEGATIVE 02/08/2016 1514   PROTEINUR NEGATIVE 02/08/2016 1514   UROBILINOGEN 0.2 06/15/2015 0029   NITRITE NEGATIVE 02/08/2016 1514   LEUKOCYTESUR NEGATIVE 02/08/2016 1514  Sepsis Labs: (procalcitonin:4,lacticidven:4)  ) Recent Results (from the past 240 hour(s))  Blood Culture (routine x 2)     Status: None (Preliminary result)   Collection Time: 02/08/16  2:57 PM  Result Value Ref Range Status   Specimen Description BLOOD RIGHT ANTECUBITAL  Final   Special Requests BOTTLES DRAWN AEROBIC AND ANAEROBIC 5CC  Final   Culture NO GROWTH 3 DAYS  Final   Report Status PENDING  Incomplete  Blood Culture (routine x 2)     Status: None (Preliminary result)   Collection Time: 02/08/16  3:02 PM  Result Value Ref Range Status   Specimen Description BLOOD LEFT ANTECUBITAL  Final   Special Requests BOTTLES DRAWN AEROBIC AND ANAEROBIC 5CC  Final   Culture NO GROWTH 3 DAYS  Final   Report Status PENDING  Incomplete  Urine culture     Status: None   Collection Time: 02/08/16  3:14 PM  Result Value Ref Range Status   Specimen Description URINE, CLEAN CATCH  Final   Special Requests NONE  Final   Culture MULTIPLE SPECIES PRESENT, SUGGEST RECOLLECTION  Final   Report Status 02/10/2016 FINAL  Final  MRSA PCR Screening     Status: None   Collection Time: 02/09/16  1:45  AM  Result Value Ref Range Status   MRSA by PCR NEGATIVE NEGATIVE Final    Comment:        The GeneXpert MRSA Assay (FDA approved for NASAL specimens only), is one component of a comprehensive MRSA colonization surveillance program. It is not intended to diagnose MRSA infection nor to guide or monitor treatment for MRSA infections.   C difficile quick scan w PCR reflex     Status: None   Collection Time: 02/09/16  8:14 AM  Result Value Ref Range Status   C Diff antigen NEGATIVE NEGATIVE Final   C Diff toxin NEGATIVE NEGATIVE Final   C Diff interpretation Negative for toxigenic C. difficile  Final         Radiology Studies: No results found.      Scheduled Meds: . aspirin EC  81 mg Oral Daily  . atorvastatin  40 mg Oral q1800  . colchicine  0.6 mg Oral Daily  . enoxaparin (LOVENOX) injection  90 mg Subcutaneous Q24H  . febuxostat  40 mg Oral Daily  . fenofibrate  54 mg Oral Daily  . insulin aspart  0-15 Units Subcutaneous 6 times per day  . insulin detemir  30 Units Subcutaneous Daily  . insulin detemir  35 Units Subcutaneous QHS  . sodium chloride flush  3 mL Intravenous Q12H  . torsemide  40 mg Oral BID  . zolpidem  5 mg Oral QHS   Continuous Infusions:     LOS: 3 days    Time spent: 26    Pleas Koch, MD [facility] ([phone]   If 7PM-7AM, please contact night-coverage [email or web address] Password [facility] 02/12/2016, 4:03 PM

## 2016-02-12 NOTE — Progress Notes (Signed)
Placed patient on CPAP for the night with oxygen set at 4lpm ° °

## 2016-02-12 NOTE — Evaluation (Signed)
Occupational Therapy Evaluation Patient Details Name: Antonio [name] MRN: [id number] DOB: [birth year] Today's Date: 02/12/2016    History of Present Illness 65 y.o. male has a past medical history of Blindness of right eye; MRSA infection; Anxiety; Aortic stenosis; HTN (hypertension); HLD (hyperlipidemia); Gout; Chronic diastolic heart failure (HCC); DM2 (diabetes mellitus, type 2), OSA on CPAP; Hepatitis; Arthritis; and History of acute renal failure, blindness in right eye, and back surgery. Pt admitted due to hypotension and also had been having diarrhea.   Clinical Impression   Pt admitted with above. Pt getting assist with bathing, PTA. Feel pt will benefit from acute OT to increase independence and strength prior to d/c. Recommending pt resume HHOT upon d/c.    Follow Up Recommendations  Home health OT;Supervision - Intermittent    Equipment Recommendations  None recommended by OT    Recommendations for Other Services       Precautions / Restrictions Restrictions Weight Bearing Restrictions: No      Mobility Bed Mobility Overal bed mobility: Modified Independent (supine to sit)                Transfers Overall transfer level: Needs assistance   Transfers: Sit to/from Stand Sit to Stand: Supervision         General transfer comment: RW in front of pt; set up for RW    Balance       Ambulated with RW with no LOB while walking.                                       ADL Overall ADL's : Needs assistance/impaired                     Lower Body Dressing: Moderate assistance;Sit to/from stand Lower Body Dressing Details (indicate cue type and reason): including socks, but pt reports he does not wear them Toilet Transfer: Supervision/safety;Ambulation;RW (and set up for RW prior to stand)           Functional mobility during ADLs: Supervision/safety;Rolling walker (and set up for RW) General ADL Comments: Pt reported that toilet  aid he has does not work well, so OT educated on what else pt could use and mentioned use of wipes. Educated on energy conservation techniques and gave pt handout. Educated on safe footwear.     Vision  per chart, blind in right eye   Perception     Praxis      Pertinent Vitals/Pain Pain Assessment: 0-10 Pain Score: 2  Pain Location: back Pain Descriptors / Indicators: Aching Pain Intervention(s): Monitored during session     Hand Dominance     Extremity/Trunk Assessment Upper Extremity Assessment Upper Extremity Assessment: Generalized weakness (bilateral shoulder flexors)   Lower Extremity Assessment Lower Extremity Assessment: Defer to PT evaluation       Communication Communication Communication: No difficulties   Cognition Arousal/Alertness: Awake/alert Behavior During Therapy: WFL for tasks assessed/performed Overall Cognitive Status: Within Functional Limits for tasks assessed                     General Comments       Exercises Exercises: Other exercises Other Exercises Other Exercises: instructed to be moving digits    Shoulder Instructions      Home Living Family/patient expects to be discharged to:: Private residence Living Arrangements: Alone Available Help at Discharge: Family;Available PRN/intermittently Type of  Home: House Home Access: Stairs to enter [employer] of Steps: 3 Entrance Stairs-Rails: Right Home Layout: One level     Bathroom Shower/Tub: Walk-in shower;Tub/shower unit;Curtain (pt has been sponge bathing)   Bathroom Toilet: Handicapped height     Home Equipment: Walker - 2 wheels;Walker - 4 wheels;Shower seat;Bedside commode;Grab bars - toilet;Grab bars - tub/shower;Adaptive equipment (power recliner/lift chair; reports he is getting tub/bench) Adaptive Equipment: Reacher;Long-handled sponge;Other (Comment) (toilet aid)        Prior Functioning/Environment Level of Independence: Needs assistance     ADL's / Homemaking Assistance Needed: assist with bathing        OT Diagnosis: Generalized weakness   OT Problem List: Decreased strength;Decreased range of motion;Obesity;Pain;Increased edema;Decreased knowledge of use of DME or AE;Decreased safety awareness;Decreased activity tolerance   OT Treatment/Interventions: Self-care/ADL training;DME and/or AE instruction;Therapeutic activities;Patient/family education;Balance training;Therapeutic exercise;Energy conservation    OT Goals(Current goals can be found in the care plan section) Acute Rehab OT Goals Patient Stated Goal: not stated OT Goal Formulation: With patient Time For Goal Achievement: 02/19/16 Potential to Achieve Goals: Good ADL Goals Pt Will Perform Lower Body Bathing: with set-up;with adaptive equipment;sit to/from stand;with supervision Additional ADL Goal #1: Pt will independently verbalize 3 energy conservation techniques and utilize in session and functional mobility as needed. Additional ADL Goal #2: Pt will independently perform HEP for bilateral UEs to increase strength.  OT Frequency: Min 2X/week   Barriers to D/C:            Co-evaluation              End of Session Equipment Utilized During Treatment: Oxygen;Rolling walker (Oxygen used part of session)  Activity Tolerance: Patient tolerated treatment well Patient left: in bed;with call bell/phone within reach;with bed alarm set (sitting EOB)   Time: 1401-1420 OT Time Calculation (min): 19 min Charges:  OT General Charges $OT Visit: 1 Procedure OT Evaluation $OT Eval Moderate Complexity: 1 Procedure G-CodesEarlie Raveling OTR/L [phone] 02/12/2016, 2:44 PM

## 2016-02-13 DIAGNOSIS — N189 Chronic kidney disease, unspecified: Secondary | ICD-10-CM

## 2016-02-13 LAB — GLUCOSE, CAPILLARY
GLUCOSE-CAPILLARY: 141 mg/dL — AB (ref 65–99)
GLUCOSE-CAPILLARY: 127 mg/dL — AB (ref 65–99)
GLUCOSE-CAPILLARY: 208 mg/dL — AB (ref 65–99)
GLUCOSE-CAPILLARY: 185 mg/dL — AB (ref 65–99)
Glucose-Capillary: 196 mg/dL — ABNORMAL HIGH (ref 65–99)
Glucose-Capillary: 213 mg/dL — ABNORMAL HIGH (ref 65–99)
Glucose-Capillary: 235 mg/dL — ABNORMAL HIGH (ref 65–99)

## 2016-02-13 LAB — CULTURE, BLOOD (ROUTINE X 2)
CULTURE: NO GROWTH
Culture: NO GROWTH

## 2016-02-13 LAB — BASIC METABOLIC PANEL
Anion gap: 9 (ref 5–15)
BUN: 27 mg/dL — AB (ref 6–20)
CO2: 37 mmol/L — ABNORMAL HIGH (ref 22–32)
CREATININE: 1.27 mg/dL — AB (ref 0.61–1.24)
Calcium: 8.8 mg/dL — ABNORMAL LOW (ref 8.9–10.3)
Chloride: 95 mmol/L — ABNORMAL LOW (ref 101–111)
GFR calc Af Amer: >60 mL/min (ref >60)
GFR, EST NON AFRICAN AMERICAN: 58 mL/min — AB (ref >60)
GLUCOSE: 125 mg/dL — AB (ref 65–99)
Potassium: 3.4 mmol/L — ABNORMAL LOW (ref 3.5–5.1)
SODIUM: 141 mmol/L (ref 135–145)

## 2016-02-13 MED ORDER — TORSEMIDE 20 MG PO TABS
80.0000 mg | ORAL_TABLET | Freq: Two times a day (BID) | ORAL | Status: DC
  Administered 2016-02-13 – 2016-02-16 (×7): 80 mg via ORAL
  Filled 2016-02-13 (×7): qty 4

## 2016-02-13 MED ORDER — POTASSIUM CHLORIDE CRYS ER 20 MEQ PO TBCR
40.0000 meq | EXTENDED_RELEASE_TABLET | Freq: Two times a day (BID) | ORAL | Status: DC
  Administered 2016-02-13 – 2016-02-16 (×7): 40 meq via ORAL
  Filled 2016-02-13 (×9): qty 2

## 2016-02-13 MED ORDER — POTASSIUM CHLORIDE CRYS ER 20 MEQ PO TBCR
20.0000 meq | EXTENDED_RELEASE_TABLET | Freq: Once | ORAL | Status: AC
  Administered 2016-02-13: 20 meq via ORAL
  Filled 2016-02-13: qty 1

## 2016-02-13 NOTE — Progress Notes (Signed)
Advanced Heart Failure Rounding Note  PCP: Dr [facility]) HF Cardiology: Dr. Shirlee Latch  Subjective:    Admitted 02/08/16 with 2 week history of diarrhea and hypotension after presenting to PCP appointment.    He had an initial weight on 02/08/16 at 375 lbs. He was treated for his hypotension with aggressive IVF rehydratation and a repeat weight later that day was 394 lbs.    Last seen in HF clinic 01/20/16. Thought to be stable, but noted dietary non compliance.  Weight at that visit was 367 lbs, standing.   He feels OK currently. Denies SOB or CP. No lightheadedness or dizziness. Frustrated to be back in the hospital, and frustrated his fluid overload was mostly due to IVF, but understands he needed rehydration with his hypotension.   He is out 7.5 L and down 8 lbs from highest weight this admission. BMET pending.  Objective:   Weight Range: 388 lb (175.996 kg) Body mass index is 54.14 kg/(m^2).   Vital Signs:   Temp:  [97.1 F (36.2 C)-98 F (36.7 C)] 97.7 F (36.5 C) (04/17 0426) Pulse Rate:  [90-100] 90 (04/17 0426) Resp:  [18-19] 18 (04/17 0426) BP: (132-143)/(77-89) 143/87 mmHg (04/17 0426) SpO2:  [92 %-96 %] 96 % (04/17 0426) Weight:  [388 lb (175.996 kg)] 388 lb (175.996 kg) (04/17 0426) Last BM Date: 02/09/16  Weight change: Filed Weights   02/11/16 0648 02/12/16 0443 02/13/16 0426  Weight: 393 lb 4.8 oz (178.4 kg) 390 lb 4.8 oz (177.039 kg) 388 lb (175.996 kg)    Intake/Output:   Intake/Output Summary (Last 24 hours) at 02/13/16 0717 Last data filed at 02/13/16 0431  Gross per 24 hour  Intake    960 ml  Output   2800 ml  Net  -1840 ml     Physical Exam: General: Morbidly obese.  Neck: Thick, JVP difficult to assess with patient girth. no thyromegaly or thyroid nodule.  Lungs: Mild basilar crackles CV: Nondisplaced PMI. Heart regular S1/S2, no S3/S4, 1/6 SEM RUSB. 1+ edema to knees bilaterally. No carotid bruit. Normal pedal pulses.   Abdomen: Morbidly obese, soft, non-tender, non-distended, no HSM. No bruits or masses. +BS  Skin: Intact without lesions or rashes.  Neurologic: Alert and oriented x 3.  Psych: Normal affect. Extremities: No clubbing or cyanosis. 1-2 plus edema. RLE with significant venous stasis changes.  HEENT: Normal.   Telemetry:  Not on telemetry.  Labs: CBC  Recent Labs  02/11/16 0218  WBC 9.0  HGB 11.8*  HCT 37.1*  MCV 86.7  PLT 261   Basic Metabolic Panel  Recent Labs  02/11/16 0218 02/12/16 0254  NA 141 140  K 4.3 3.7  CL 102 96*  CO2 29 33*  GLUCOSE 141* 187*  BUN 23* 28*  CREATININE 1.31* 1.55*  CALCIUM 9.0 8.7*   Liver Function Tests  Recent Labs  02/12/16 0254  AST 16  ALT 15*  ALKPHOS 57  BILITOT 0.5  PROT 6.1*  ALBUMIN 3.1*   No results for input(s): LIPASE, AMYLASE in the last 72 hours. Cardiac Enzymes No results for input(s): CKTOTAL, CKMB, CKMBINDEX, TROPONINI in the last 72 hours.  BNP: BNP (last 3 results)  Recent Labs  01/01/16 1108 01/02/16 0652 02/08/16 1513  BNP 196.0* 173.7* 29.9    ProBNP (last 3 results) No results for input(s): PROBNP in the last 8760 hours.   D-Dimer No results for input(s): DDIMER in the last 72 hours. Hemoglobin A1C No results for input(s):  HGBA1C in the last 72 hours. Fasting Lipid Panel No results for input(s): CHOL, HDL, LDLCALC, TRIG, CHOLHDL, LDLDIRECT in the last 72 hours. Thyroid Function Tests No results for input(s): TSH, T4TOTAL, T3FREE, THYROIDAB in the last 72 hours.  Invalid input(s): FREET3  Other results:     Imaging/Studies:   No results found.  Latest Echo  Latest Cath   Medications:     Scheduled Medications: . aspirin EC  81 mg Oral Daily  . atorvastatin  40 mg Oral q1800  . colchicine  0.6 mg Oral Daily  . enoxaparin (LOVENOX) injection  90 mg Subcutaneous Q24H  . febuxostat  40 mg Oral Daily  . fenofibrate  54 mg Oral Daily  . insulin aspart  0-15 Units  Subcutaneous 6 times per day  . insulin detemir  30 Units Subcutaneous Daily  . insulin detemir  35 Units Subcutaneous QHS  . sodium chloride flush  3 mL Intravenous Q12H  . torsemide  40 mg Oral BID  . zolpidem  5 mg Oral QHS     Infusions:     PRN Medications:  acetaminophen **OR** acetaminophen, albuterol, HYDROcodone-acetaminophen, ondansetron **OR** ondansetron (ZOFRAN) IV, oxyCODONE, polyethylene glycol   Assessment/Plan   Admitted 02/08/16 with presumed dehydration from diarrhea x 2 weeks.  Treated with IVF and now volume overloaded. HF team consulted for medication adjustments.   1. Acute on chronic diastolic CHF -  He is currently on decreased dose of his home torsemide, which he has diuresed on. Will increase to his previous home dose of torsemide 80 mg BID this evening.  - He has not had any IV lasix this admission with his hypotension.  Now improved. He has diuresed adequately on torsemide, so will just increase as above.  - BMET pending.  2. AKI on CKD Stage III - Much improved. BMET pending for today.  - Continue to follow closely with diuresis.  3. Bioprosthetic aortic valve - Stable on most recent echo 4. Morbid Obesity - Needs to limit portion sizes and increase activity.  - Has been seen by PT/OT. HHPT/OT recommended. 5. OSA - Continue CPAP nightly 6. DM - Per primary team.    Length of Stay: 4   [name] PA-C 02/13/2016, 7:17 AM  Advanced Heart Failure Team Pager [phone] (M-F; 7a - 4p)  Please contact [facility] for night-coverage after hours (4p -7a ) and weekends on [email or web address]  Patient seen with PA, agree with the above note.  Doing ok today, diuresing on po torsemide.  Continue 80 mg bid, still above his baseline weight.  Hopefully home mid-week.   [name] 02/13/2016 11:55 AM

## 2016-02-13 NOTE — Progress Notes (Signed)
Occupational Therapy Treatment Patient Details Name: Antonio [name] MRN: [id number] DOB: 1951/10/20 Today's Date: 02/13/2016    History of present illness 65 y.o. male has a past medical history of Blindness of right eye; MRSA infection; Anxiety; Aortic stenosis; HTN (hypertension); HLD (hyperlipidemia); Gout; Chronic diastolic heart failure (HCC); DM2 (diabetes mellitus, type 2), OSA on CPAP; Hepatitis; Arthritis; and History of acute renal failure, blindness in right eye, and back surgery. Pt admitted due to hypotension and also had been having diarrhea.   OT comments  Patient making good progress towards OT goals, pt with good carryover from eval/treat on Sunday. Patient's 02 sats ranged from 88-90% while pt standing. Educated pt on energy conservation techniques, importance of elevation and exercise to decrease swelling in extremities. Would like to educate pt on BUE HEP next OT session, using theraband. See below under ADL comment for more information regarding this OT session.     Follow Up Recommendations  Home health OT;Supervision - Intermittent    Equipment Recommendations  Tub/shower bench    Recommendations for Other Services  None at this time   Precautions / Restrictions Precautions Precautions: Other (comment) Precaution Comments: watch SaO2 Restrictions Weight Bearing Restrictions: No    Mobility Bed Mobility General bed mobility comments: Pt found seated in recliner upon OT entering/exiting room   Transfers Overall transfer level: Modified independent Equipment used: None Transfers: Sit to/from Stand Sit to Stand: Modified independent (Device/Increase time)         General transfer comment: Increased time needed and pt unable to stand for more than 1 minute due to pain in bottom of feet     Balance Overall balance assessment: No apparent balance deficits (not formally assessed)   ADL Overall ADL's : Needs assistance/impaired Eating/Feeding: Set up;Sitting      Grooming Details (indicate cue type and reason): Pt refused  Upper Body Bathing: Minimal assitance;Sitting   Lower Body Bathing: Moderate assistance;Sit to/from stand   Upper Body Dressing : Minimal assistance;Sitting   Lower Body Dressing: Moderate assistance;Sit to/from Market researcher Details (indicate cue type and reason): Pt refused, stated he's been doing "fine" with getting on/off elevated toilet seat in BR  Toileting- Clothing Manipulation and Hygiene: Maximal assistance;Sit to/from stand Toileting - Clothing Manipulation Details (indicate cue type and reason): Pt reports he's been using a toileting aid that he doesn't like. Discussed use of tongs as a toileting aid    Tub/Shower Transfer Details (indicate cue type and reason): Discussed use of tub transfer bench in shower to increase independence and safety with shower transfers  Functional mobility during ADLs: Supervision/safety;Rolling walker General ADL Comments: Pt found seated in recliner. Discussed energy conservation techniques, using handout. Pt performed sit to/from stand from recliner. Pt with increased pain in standing on bottom of B feet and needed to sit back down within ~1 minute of standing. Pt refused going to sink to perform grooming tasks or brush teeth, pt refused ambulating to BR because he stated he's beed doing that just "fine". Educated pt on importance of elevating BUEs and BLEs due to increased swelling and performing ankle pumps and hand pumps to help eleviate swelling. Pt verbalized understanding of all this.           Cognition   Behavior During Therapy: WFL for tasks assessed/performed Overall Cognitive Status: Within Functional Limits for tasks assessed                 Pertinent Vitals/ Pain  Pain Assessment: Faces Faces Pain Scale: Hurts little more Pain Location: back, bottom of feet, hands  Pain Descriptors / Indicators: Throbbing;Aching Pain Intervention(s): Limited  activity within patient's tolerance;Monitored during session;Premedicated before session;Repositioned   Frequency Min 2X/week     Progress Toward Goals  OT Goals(current goals can now befound in the care plan section)  Progress towards OT goals: Progressing toward goals  Acute Rehab OT Goals Patient Stated Goal: decrease pain and swelling  OT Goal Formulation: With patient Time For Goal Achievement: 02/19/16 Potential to Achieve Goals: Good  Plan Discharge plan remains appropriate    End of Session Equipment Utilized During Treatment: Oxygen (4L/min)   Activity Tolerance Patient tolerated treatment well   Patient Left in chair;with call bell/phone within reach    Time: 1531-1546 OT Time Calculation (min): 15 min  Charges: OT General Charges $OT Visit: 1 Procedure OT Treatments $Self Care/Home Management : 8-22 mins  Edwin Cap , MS, OTR/L, CLT Pager: [phone]  02/13/2016, 3:54 PM

## 2016-02-13 NOTE — Care Management Important Message (Signed)
Important Message  Patient Details  Name: Antonio [name] MRN: [id number] Date of Birth: 1951-05-22   Medicare Important Message Given:  Yes    Moises Terpstra P Aariv Medlock 02/13/2016, 1:46 PM

## 2016-02-13 NOTE — Progress Notes (Signed)
PROGRESS NOTE    Antonio [name]  JTT:[id number] DOB: 1951/04/29 DOA: 02/08/2016 PCP: [name], MD  Outpatient Specialists:   Cardiology ACHF team  Brief Narrative:  65 y/o ? OSA on CPAP ? 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  Subjective:  Well Walked, moving more Still not up in the halls Has not thought too much about diet, exercise etc.  Assessment & Plan:   Active Problems:   Hyperlipidemia   Aortic stenosis   PAF (paroxysmal atrial fibrillation) (HCC)   Chronic diastolic CHF (congestive heart failure) (HCC)   OSA on CPAP   Insulin dependent diabetes mellitus (HCC)   Obesity hypoventilation syndrome (HCC)   Acute renal failure superimposed on stage 3 chronic kidney disease (HCC)   Diarrhea   Dehydration   Acute-on-chronic kidney injury (HCC)   Acute on chronic diastolic HF (heart failure) (HCC)   Chronic diastolic CHF (congestive heart failure) (HCC) - evidence of dehydration initially-  resume torsemide per cardiology , ACHF team who know him well -7 liters so far wght 396-->393-->390-->388  . Acute renal failure superimposed on pre-existing cardiorenal syndrome and a setting of on stage 3 chronic kidney disease (HCC) ,  baseline 1.5, 2.28 >1.8>1.55>>>1.27 initially vol resuscitated, continue diuresing Labs am   diarrhea-completely resolved  . Hyperlipidemia  -stable continue home medication  . Obesity hypoventilation syndrome (HCC) on oxygen at home will continue-  is scheduled for sleep study stone and this may benefit him in terms of managing his volume and elevated PA pressures   . PAF (paroxysmal atrial  fibrillation) (HCC) PSVT  patient denies history of atrial fibrillation not on any anticoagulation history father clarified   . Diarrhea obtain stool studies, likely viral gastroenteritis, C. difficile negative , GI pathogen panel   . Dehydration -likely secondary to diarrhea , resolved   Diabetes mellitus  continue Levemir 30 am, 35 U pm , start patient on sliding scale insulin  -mod control 127-235   lower extremities appear to be cyanotic -2/2 to bullae from volume.   Will resolve with diuresis- -consider Mag sulphate dressing to dry it up   Super morbid obesity Body mass index is 54.14 kg/(m^2). We focused 02/13/2016 on ?Bariatric options.  He has lost upto 70 lbs before He will benefit from further OP discussion-unclear how motivated he will be in OP setting     DVT prophylaxsis Lovenox   DVT prophylaxis: Lovenox Code Status: Full Family Communication: none Disposition Plan: home when at dry weight   Consultants:   Cardiology  Procedures:   VQ   Antimicrobials:   vanc 02/08/16-->4.14  Zosyn 02/08/16 -->4.14   Objective: Filed Vitals:   02/12/16 1935 02/13/16 0025 02/13/16 0426 02/13/16 1149  BP: 138/79 132/77 143/87 111/56  Pulse: 95 92 90 89  Temp: 97.5 F (36.4 C) 97.1 F (36.2 C) 97.7 F (36.5 C) 97.6 F (36.4 C)  TempSrc: Oral Oral Oral Oral  Resp: 19  18 18   Height:      Weight:   175.996 kg (388 lb)   SpO2: 92% 92% 96% 94%    Intake/Output Summary (Last 24 hours) at 02/13/16 1617 Last data filed at 02/13/16 1409  Gross per 24 hour  Intake   1200 ml  Output   2300 ml  Net  -1100 ml   Filed Weights   02/11/16 0648 02/12/16 0443 02/13/16 0426  Weight: 178.4 kg (393 lb 4.8 oz) 177.039 kg (390 lb 4.8 oz) 175.996 kg (388 lb)    Examination:  General exam: Comfortable  Respiratory system: Clear to auscultation. Respiratory effort normal. Cardiovascular system: S1 & S2 heard, RRR. Cannot assess JVD,  Cannot assess murmurs, rubs,  gallops or clicks. . Gastrointestinal system: Abdomen is obese Central nervous system: Alert and oriented. No focal neurological deficits. Extremities: Symmetric 5 x 5 power. Skin: 3+ edema + anasarca, bullae to R gr8 toe Psychiatry: Judgement and insight appear normal. Mood & affect appropriate.     Data Reviewed: I have personally reviewed following labs and imaging studies  CBC:  Recent Labs Lab 02/08/16 1513 02/09/16 0439 02/10/16 0515 02/11/16 0218  WBC 13.7* 8.7 8.7 9.0  NEUTROABS 7.9*  --   --   --   HGB 13.5 11.5* 11.8* 11.8*  HCT 44.0 38.5* 39.4 37.1*  MCV 87.8 88.5 87.6 86.7  PLT 288 251 258 261   Basic Metabolic Panel:  Recent Labs Lab 02/09/16 0439 02/10/16 0515 02/11/16 0218 02/12/16 0254 02/13/16 0800  NA 141 142 141 140 141  K 4.5 4.2 4.3 3.7 3.4*  CL 98* 101 102 96* 95*  CO2 32 31 29 33* 37*  GLUCOSE 261* 201* 141* 187* 125*  BUN 47* 30* 23* 28* 27*  CREATININE 1.80* 1.54* 1.31* 1.55* 1.27*  CALCIUM 8.4* 8.9 9.0 8.7* 8.8*  MG 2.3  --   --   --   --   PHOS 4.3  --   --   --   --    GFR: Estimated Creatinine Clearance: 94.8 mL/min (by C-G formula based on Cr of 1.27). Liver Function Tests:  Recent Labs Lab 02/08/16 1513 02/09/16 0439 02/10/16 0515 02/12/16 0254  AST ALT 16* 13* 15* 15*  ALKPHOS 72 56 62 57  BILITOT 0.5 0.6 0.7 0.5  PROT 7.1 5.9* 6.4* 6.1*  ALBUMIN 3.6 2.9* 3.3* 3.1*   No results for input(s): LIPASE, AMYLASE in the last 168 hours. No results for input(s): AMMONIA in the last 168 hours. Coagulation Profile: No results for input(s): INR, PROTIME in the last 168 hours. Cardiac Enzymes: No results for input(s): CKTOTAL, CKMB, CKMBINDEX, TROPONINI in the last 168 hours. BNP (last 3 results) No results for input(s): PROBNP in the last 8760 hours. HbA1C: No results for input(s): HGBA1C in the last 72 hours. CBG:  Recent Labs Lab 02/12/16 2118 02/13/16 0024 02/13/16 0422 02/13/16 0800 02/13/16 1140    GLUCAP 204* 213* 141* 127* 235*   Lipid Profile: No results for input(s): CHOL, HDL, LDLCALC, TRIG, CHOLHDL, LDLDIRECT in the last 72 hours. Thyroid Function Tests: No results for input(s): TSH, T4TOTAL, FREET4, T3FREE, THYROIDAB in the last 72 hours. Anemia Panel: No results for input(s): VITAMINB12, FOLATE, FERRITIN, TIBC, IRON, RETICCTPCT in the last 72 hours. Urine analysis:    Component Value Date/Time   COLORURINE YELLOW 02/08/2016 1514   APPEARANCEUR CLOUDY* 02/08/2016 1514   LABSPEC 1.010 02/08/2016 1514   PHURINE 5.0 02/08/2016 1514   GLUCOSEU NEGATIVE 02/08/2016 1514   HGBUR NEGATIVE 02/08/2016 1514   HGBUR negative 06/12/2007 0843   BILIRUBINUR NEGATIVE 02/08/2016 1514   KETONESUR NEGATIVE 02/08/2016 1514   PROTEINUR NEGATIVE 02/08/2016 1514   UROBILINOGEN 0.2 06/15/2015 0029  NITRITE NEGATIVE 02/08/2016 1514   LEUKOCYTESUR NEGATIVE 02/08/2016 1514   Sepsis Labs: @LABRCNTIP (procalcitonin:4,lacticidven:4)  ) Recent Results (from the past 240 hour(s))  Blood Culture (routine x 2)     Status: None (Preliminary result)   Collection Time: 02/08/16  2:57 PM  Result Value Ref Range Status   Specimen Description BLOOD RIGHT ANTECUBITAL  Final   Special Requests BOTTLES DRAWN AEROBIC AND ANAEROBIC 5CC  Final   Culture NO GROWTH 4 DAYS  Final   Report Status PENDING  Incomplete  Blood Culture (routine x 2)     Status: None (Preliminary result)   Collection Time: 02/08/16  3:02 PM  Result Value Ref Range Status   Specimen Description BLOOD LEFT ANTECUBITAL  Final   Special Requests BOTTLES DRAWN AEROBIC AND ANAEROBIC 5CC  Final   Culture NO GROWTH 4 DAYS  Final   Report Status PENDING  Incomplete  Urine culture     Status: None   Collection Time: 02/08/16  3:14 PM  Result Value Ref Range Status   Specimen Description URINE, CLEAN CATCH  Final   Special Requests NONE  Final   Culture MULTIPLE SPECIES PRESENT, SUGGEST RECOLLECTION  Final   Report Status 02/10/2016  FINAL  Final  MRSA PCR Screening     Status: None   Collection Time: 02/09/16  1:45 AM  Result Value Ref Range Status   MRSA by PCR NEGATIVE NEGATIVE Final    Comment:        The GeneXpert MRSA Assay (FDA approved for NASAL specimens only), is one component of a comprehensive MRSA colonization surveillance program. It is not intended to diagnose MRSA infection nor to guide or monitor treatment for MRSA infections.   C difficile quick scan w PCR reflex     Status: None   Collection Time: 02/09/16  8:14 AM  Result Value Ref Range Status   C Diff antigen NEGATIVE NEGATIVE Final   C Diff toxin NEGATIVE NEGATIVE Final   C Diff interpretation Negative for toxigenic C. difficile  Final         Radiology Studies: No results found.      Scheduled Meds: . aspirin EC  81 mg Oral Daily  . atorvastatin  40 mg Oral q1800  . colchicine  0.6 mg Oral Daily  . enoxaparin (LOVENOX) injection  90 mg Subcutaneous Q24H  . febuxostat  40 mg Oral Daily  . fenofibrate  54 mg Oral Daily  . insulin aspart  0-15 Units Subcutaneous 6 times per day  . insulin detemir  30 Units Subcutaneous Daily  . insulin detemir  35 Units Subcutaneous QHS  . potassium chloride  40 mEq Oral BID  . sodium chloride flush  3 mL Intravenous Q12H  . torsemide  80 mg Oral BID  . zolpidem  5 mg Oral QHS   Continuous Infusions:     LOS: 4 days    Time spent: 32    Pleas Koch, MD [facility] ([phone]   If 7PM-7AM, please contact night-coverage [email or web address] Password [facility] 02/13/2016, 4:17 PM

## 2016-02-13 NOTE — Progress Notes (Signed)
Inpatient Diabetes Program Recommendations  AACE/ADA: New Consensus Statement on Inpatient Glycemic Control (2015)  Target Ranges:  Prepandial:   less than 140 mg/dL      Peak postprandial:   less than 180 mg/dL (1-2 hours)      Critically ill patients:  140 - 180 mg/dL  Results for PINCKNEY, FERLITA (MRN [id number]) as of 02/13/2016 11:49  Ref. Range 02/12/2016 08:06 02/12/2016 16:38 02/12/2016 17:18 02/12/2016 19:37 02/12/2016 21:18 02/13/2016 00:24 02/13/2016 04:22 02/13/2016 08:00 02/13/2016 11:40  Glucose-Capillary Latest Ref Range: 65-99 mg/dL 376 (H) 283 (H)  151 (H) 312 (H) 204 (H) 213 (H) 141 (H) 127 (H) 235 (H)   Review of Glycemic Control  Diabetes history: DM2 Outpatient Diabetes medications: Levemir 25 units QAM, Levemir 30 units QHS, Novolog 30-40 units TID with meals Current orders for Inpatient glycemic control: Levemir 30 units daily, Levemir 35 units QHS, Novolog 0-15 units Q4H  Inpatient Diabetes Program Recommendations:  Insulin - Basal: Patient's glucose has ranged from 114-312 mg/dl and patient has received a total of Novolog 25 units for correction over the past 24 hours. Please consider increasing Levemir to 35 units BID. Correction (SSI): In reviewing the chart, note that patient is eating well. Please consider changing frequency of CBGs and Novolog correction to ACHS. Insulin - Meal Coverage: Please consider ordering Novolog 5 units TID with meals for meal coverge (in addition to Novolog correction scale).  Thanks, Orlando Penner, RN, MSN, CDE Diabetes Coordinator Inpatient Diabetes Program [phone] (Team Pager from 8am to 5pm) [phone] ([facility] office) [phone] [facility] office) [phone] [facility] office)

## 2016-02-13 NOTE — Progress Notes (Signed)
Physical Therapy Treatment Patient Details Name: Antonio [name] MRN: [id number] DOB: 01-14-1951 Today's Date: 02/13/2016    History of Present Illness 65 y.o. male has a past medical history of Blindness of right eye; MRSA infection; Anxiety; Aortic stenosis; HTN (hypertension); HLD (hyperlipidemia); Gout; Chronic diastolic heart failure (HCC); DM2 (diabetes mellitus, type 2), OSA on CPAP; Hepatitis; Arthritis; and History of acute renal failure, blindness in right eye, and back surgery. Pt admitted due to hypotension and also had been having diarrhea.    PT Comments    Patient highly motivated and with good understanding of his disease process and need for increasing activity. He demonstrated independence with general LE exercises for edema management. Primarily limited in mobility due to decr SaO2 (down to 80% on room air with ambulating 55 ft; able to incr to 84% on room air with pursed lip breathing; resumed 4L and increased to 91%).    Follow Up Recommendations  Home health PT;Supervision - Intermittent     Equipment Recommendations  None recommended by PT    Recommendations for Other Services       Precautions / Restrictions Precautions Precautions: Other (comment) Precaution Comments: watch SaO2 Restrictions Weight Bearing Restrictions: No    Mobility  Bed Mobility Overal bed mobility: Modified Independent (supine to sit)                Transfers Overall transfer level: Modified independent Equipment used: Rolling walker (2 wheeled) Transfers: Sit to/from Stand              Ambulation/Gait Ambulation/Gait assistance: Min guard Ambulation Distance (Feet): 55 Feet Assistive device: Rolling walker (2 wheeled) Gait Pattern/deviations: Step-to pattern;Decreased stride length;Trunk flexed Gait velocity: decreased Gait velocity interpretation: Below normal speed for age/gender General Gait Details: RW slightly short for pt; reports this is how tall his RW is  at home and it's at its tallest   Information systems manager Rankin (Stroke Patients Only)       Balance Overall balance assessment: No apparent balance deficits (not formally assessed)                                  Cognition Arousal/Alertness: Awake/alert Behavior During Therapy: WFL for tasks assessed/performed Overall Cognitive Status: Within Functional Limits for tasks assessed                      Exercises General Exercises - Lower Extremity Ankle Circles/Pumps: AROM;Both;10 reps Quad Sets: AROM;Both;5 reps Long Arc Quad: AROM;Both;5 reps Heel Raises: AROM;Both;5 reps;Seated Other Exercises Other Exercises: sit to stand from recliner x 5; +use of armrests, incr time; no imbalance    General Comments General comments (skin integrity, edema, etc.): Educated re: importance of frequent changes in position to prevent further incr LE edema. Educated in ROM exercises      Pertinent Vitals/Pain Pain Assessment: No/denies pain    Home Living                      Prior Function            PT Goals (current goals can now be found in the care plan section) Acute Rehab PT Goals Patient Stated Goal: be able to walk to his truck and drive again Time For Goal Achievement: 02/17/16 Progress towards PT goals: Progressing toward goals  Frequency  Min 3X/week    PT Plan Current plan remains appropriate    Co-evaluation             End of Session Equipment Utilized During Treatment: Oxygen Activity Tolerance: Patient limited by fatigue;Treatment limited secondary to medical complications (Comment) (decr SaO2) Patient left: in chair;with call bell/phone within reach (chair alarm not set for pt to wt-shift, sit to stand; alert)     Time: 6151-8343 PT Time Calculation (min) (ACUTE ONLY): 40 min  Charges:  $Gait Training: 8-22 mins $Therapeutic Exercise: 8-22 mins $Therapeutic Activity: 8-22  mins                    G Codes:      Verland Sprinkle 2016-03-12, 10:42 AM Pager [phone]

## 2016-02-13 NOTE — Progress Notes (Signed)
Pt places self on cpap will call this RT if any help needed

## 2016-02-14 LAB — GLUCOSE, CAPILLARY
GLUCOSE-CAPILLARY: 293 mg/dL — AB (ref 65–99)
GLUCOSE-CAPILLARY: 239 mg/dL — AB (ref 65–99)
Glucose-Capillary: 224 mg/dL — ABNORMAL HIGH (ref 65–99)
Glucose-Capillary: 169 mg/dL — ABNORMAL HIGH (ref 65–99)
Glucose-Capillary: 213 mg/dL — ABNORMAL HIGH (ref 65–99)
Glucose-Capillary: 230 mg/dL — ABNORMAL HIGH (ref 65–99)

## 2016-02-14 LAB — BASIC METABOLIC PANEL
ANION GAP: 11 (ref 5–15)
BUN: 27 mg/dL — ABNORMAL HIGH (ref 6–20)
CALCIUM: 8.9 mg/dL (ref 8.9–10.3)
CO2: 35 mmol/L — AB (ref 22–32)
Chloride: 94 mmol/L — ABNORMAL LOW (ref 101–111)
Creatinine, Ser: 1.18 mg/dL (ref 0.61–1.24)
GFR calc Af Amer: >60 mL/min (ref >60)
GFR calc non Af Amer: >60 mL/min (ref >60)
GLUCOSE: 222 mg/dL — AB (ref 65–99)
Potassium: 3.7 mmol/L (ref 3.5–5.1)
Sodium: 140 mmol/L (ref 135–145)

## 2016-02-14 LAB — MAGNESIUM: Magnesium: 2 mg/dL (ref 1.7–2.4)

## 2016-02-14 MED ORDER — POTASSIUM CHLORIDE CRYS ER 20 MEQ PO TBCR
40.0000 meq | EXTENDED_RELEASE_TABLET | Freq: Once | ORAL | Status: AC
  Administered 2016-02-14: 40 meq via ORAL
  Filled 2016-02-14: qty 2

## 2016-02-14 MED ORDER — METOLAZONE 5 MG PO TABS
5.0000 mg | ORAL_TABLET | Freq: Once | ORAL | Status: AC
  Administered 2016-02-14: 5 mg via ORAL
  Filled 2016-02-14: qty 1

## 2016-02-14 NOTE — Care Management Note (Signed)
Case Management Note  Patient Details  Name: PAXTIN YAX MRN: [id number] Date of Birth: 1950/11/28  Subjective/Objective:      CHF              Action/Plan: Discharge Planning:  Chart reviewed. NCM spoke to pt at bedside. States he is active with [facility] PT, OT and RN. He has oxygen, CPAP and RW at home. Lives alone, He has two ex-wives that will assist as needed. Will continue to follow and send resumption of care orders to [facility] when they become available.    Oren Bracket MD  Expected Discharge Date:                  Expected Discharge Plan:  Home w Home Health Services  In-House Referral:  NA  Discharge planning Services  CM Consult  Post Acute Care Choice:  Home Health Choice offered to:  Patient  DME Arranged:  N/A DME Agency:  NA  HH Arranged:  RN HH Agency:     Status of Service:  In process, will continue to follow  Medicare Important Message Given:  Yes Date Medicare IM Given:    Medicare IM give by:    Date Additional Medicare IM Given:    Additional Medicare Important Message give by:     If discussed at Long Length of Stay Meetings, dates discussed:    Additional Comments:  Elliot Cousin, RN 02/14/2016, 4:46 PM

## 2016-02-14 NOTE — Progress Notes (Signed)
Physical Therapy Treatment Patient Details Name: Antonio [name] MRN: [id number] DOB: 1950/11/27 Today's Date: 02/14/2016    History of Present Illness 66 y.o. male has a past medical history of Blindness of right eye; MRSA infection; Anxiety; Aortic stenosis; HTN (hypertension); HLD (hyperlipidemia); Gout; Chronic diastolic heart failure (HCC); DM2 (diabetes mellitus, type 2), OSA on CPAP; Hepatitis; Arthritis; and History of acute renal failure, blindness in right eye, and back surgery. Pt admitted due to hypotension and also had been having diarrhea.    PT Comments    Tolerated ambulation x 75 Ft with 4L O2 and SaO2 88-89% and much less dyspnea compared to 4/17. Cues for decr talking while walking. Also had to cue patient to use his O2 while walking. Next visit practice step-ups on single step (can bring to pt's room).   Follow Up Recommendations  Home health PT;Supervision - Intermittent     Equipment Recommendations  None recommended by PT    Recommendations for Other Services       Precautions / Restrictions Precautions Precautions: Other (comment) Precaution Comments: watch SaO2 Restrictions Weight Bearing Restrictions: No    Mobility  Bed Mobility Overal bed mobility: Modified Independent (supine to sit)                Transfers Overall transfer level: Modified independent Equipment used: Rolling walker (2 wheeled) Transfers: Sit to/from Stand Sit to Stand: Modified independent (Device/Increase time)            Ambulation/Gait Ambulation/Gait assistance: Supervision Ambulation Distance (Feet): 75 Feet Assistive device: Rolling walker (2 wheeled) Gait Pattern/deviations: Step-through pattern;Decreased stride length;Trunk flexed Gait velocity: decreased   General Gait Details: vc for need to use O2 (despite desaturation 4/17 and discussion, pt took off O2 and wanted to walk on room air)   Stairs            Wheelchair Mobility    Modified  Rankin (Stroke Patients Only)       Balance Overall balance assessment: No apparent balance deficits (not formally assessed)                                  Cognition Arousal/Alertness: Awake/alert Behavior During Therapy: WFL for tasks assessed/performed Overall Cognitive Status: Within Functional Limits for tasks assessed                      Exercises      General Comments        Pertinent Vitals/Pain Pain Assessment: Faces Faces Pain Scale: Hurts even more Pain Location: Lt low back and hip Pain Descriptors / Indicators: Grimacing;Nagging Pain Intervention(s): Limited activity within patient's tolerance;Monitored during session;Repositioned    Home Living                      Prior Function            PT Goals (current goals can now be found in the care plan section) Acute Rehab PT Goals Patient Stated Goal: be able to walk to his truck and drive again Time For Goal Achievement: 02/17/16 Progress towards PT goals: Progressing toward goals    Frequency  Min 3X/week    PT Plan Current plan remains appropriate    Co-evaluation             End of Session Equipment Utilized During Treatment: Oxygen (pt too large for gait belt) Activity Tolerance: Patient tolerated  treatment well Patient left: in chair;with call bell/phone within reach;with family/visitor present     Time: 4098-1191 PT Time Calculation (min) (ACUTE ONLY): 26 min  Charges:  $Gait Training: 23-37 mins                    G Codes:      Tekeisha Hakim 2016/03/05, 4:43 PM Pager [phone]

## 2016-02-14 NOTE — Progress Notes (Signed)
PROGRESS NOTE    Antonio [name]  ZOX:[id number] DOB: [birth year] DOA: 02/08/2016 PCP: [name], MD  Outpatient Specialists:   Cardiology ACHF team  Brief Narrative:  65 y/o ? OSA on CPAP ? 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   Admitted initallyWith concerns for sepsis probably secondary to pneumonia and received 3 days of IV antibiotics Was 5 L positive and was then semi-aggressively diuresed and advanced heart failure team gave guidance with regards to management.  Subjective:  Doing fair No new issues diuresing Watched full movie marathon last night-slept at 04:00 so tire dthis am No diarr no cp  Assessment & Plan:   Active Problems:   Hyperlipidemia   Aortic stenosis   PAF (paroxysmal atrial fibrillation) (HCC)   Chronic diastolic CHF (congestive heart failure) (HCC)   OSA on CPAP   Insulin dependent diabetes mellitus (HCC)   Obesity hypoventilation syndrome (HCC)   Acute renal failure superimposed on stage 3 chronic kidney disease (HCC)   Diarrhea   Dehydration   Acute-on-chronic kidney injury (HCC)   Acute on chronic diastolic HF (heart failure) (HCC)   Chronic diastolic CHF (congestive heart failure) (HCC) - evidence of dehydration initially-  resume torsemide/metalozone and adjust perACHF team who know him well -9.78  so far wght 396-->393-->390-->388-->389  . Acute renal failure superimposed on pre-existing cardiorenal syndrome and a setting of on stage 3 chronic kidney disease (HCC) ,  baseline 1.5, 2.28 >1.8>1.55>1.27>>>1.18 initially vol resuscitated, continue diuresing Labs am   diarrhea-completely  resolved  . Hyperlipidemia  -stable continue home medication  . Obesity hypoventilation syndrome (HCC) on oxygen at home will continue-  is scheduled for sleep study stone and this may benefit him in terms of managing his volume and elevated PA pressures   . PAF (paroxysmal atrial fibrillation) (HCC) PSVT  patient denies history of atrial fibrillation not on any anticoagulation history father clarified   . Diarrhea obtain stool studies, likely viral gastroenteritis, C. difficile negative , GI pathogen panel   . Dehydration -likely secondary to diarrhea , resolved   Diabetes mellitus  continue Levemir 30 am, 35 U pm , start patient on sliding scale insulin  -mod control 169--224   lower extremities appear to be cyanotic -2/2 to bullae from volume.   Will resolve with diuresis- -add Mag sulphate dressing to dry it up--not available per pharmacy   Super morbid obesity Body mass index is 54.39 kg/(m^2). We focused priorBariatric options.  He has lost upto 70 lbs before He will benefit from further OP discussion-unclear how motivated he will be in OP setting     DVT prophylaxsis Lovenox   DVT prophylaxis: Lovenox Code Status: Full Family Communication: none Disposition Plan: home when at dry weight   Consultants:   Cardiology  Procedures:   VQ  Antimicrobials:   vanc 02/08/16-->4.14  Zosyn 02/08/16 -->4.14   Objective: Filed Vitals:   02/13/16 1149 02/13/16 1944 02/14/16 0617 02/14/16 1134  BP: 111/56 124/71 135/87 120/70  Pulse: 89 86 91 93  Temp: 97.6 F (36.4 C) 98 F (36.7 C) 97.8 F (36.6 C) 97.7 F (36.5 C)  TempSrc: Oral Oral Oral Oral  Resp: 18 18  18 18  Height:      Weight:   176.812 kg (389 lb 12.8 oz)   SpO2: 94% 94% 92% 94%    Intake/Output Summary (Last 24 hours) at 02/14/16 1641 Last data filed at 02/14/16 1615  Gross per 24 hour  Intake    960 ml  Output   3675 ml  Net  -2715 ml   Filed Weights   02/12/16 0443 02/13/16 0426  02/14/16 0617  Weight: 177.039 kg (390 lb 4.8 oz) 175.996 kg (388 lb) 176.812 kg (389 lb 12.8 oz)    Examination:  General exam: Comfortable  Respiratory system: Clear to auscultation. Respiratory effort normal. Cardiovascular system: S1 & S2 heard, RRR. Cannot assess JVD,  Cannot assess murmurs, rubs, gallops or clicks. . Gastrointestinal system: Abdomen is obese Central nervous system: Alert and oriented. No focal neurological deficits. Extremities: Symmetric 5 x 5 power. Skin: 3+ edema + anasarca, bullae to R gr8 toe Psychiatry: Judgement and insight appear normal. Mood & affect appropriate.     Data Reviewed: I have personally reviewed following labs and imaging studies  CBC:  Recent Labs Lab 02/08/16 1513 02/09/16 0439 02/10/16 0515 02/11/16 0218  WBC 13.7* 8.7 8.7 9.0  NEUTROABS 7.9*  --   --   --   HGB 13.5 11.5* 11.8* 11.8*  HCT 44.0 38.5* 39.4 37.1*  MCV 87.8 88.5 87.6 86.7  PLT 288 251 258 261   Basic Metabolic Panel:  Recent Labs Lab 02/09/16 0439 02/10/16 0515 02/11/16 0218 02/12/16 0254 02/13/16 0800 02/14/16 0435  NA 141 142 141 140 141 140  K 4.5 4.2 4.3 3.7 3.4* 3.7  CL 98* 101 102 96* 95* 94*  CO2 32 31 29 33* 37* 35*  GLUCOSE 261* 201* 141* 187* 125* 222*  BUN 47* 30* 23* 28* 27* 27*  CREATININE 1.80* 1.54* 1.31* 1.55* 1.27* 1.18  CALCIUM 8.4* 8.9 9.0 8.7* 8.8* 8.9  MG 2.3  --   --   --   --  2.0  PHOS 4.3  --   --   --   --   --    GFR: Estimated Creatinine Clearance: 102.3 mL/min (by C-G formula based on Cr of 1.18). Liver Function Tests:  Recent Labs Lab 02/08/16 1513 02/09/16 0439 02/10/16 0515 02/12/16 0254  AST 22 18 15 16   ALT 16* 13* 15* 15*  ALKPHOS 72 56 62 57  BILITOT 0.5 0.6 0.7 0.5  PROT 7.1 5.9* 6.4* 6.1*  ALBUMIN 3.6 2.9* 3.3* 3.1*   No results for input(s): LIPASE, AMYLASE in the last 168 hours. No results for input(s): AMMONIA in the last 168 hours. Coagulation Profile: No results for input(s): INR, PROTIME  in the last 168 hours. Cardiac Enzymes: No results for input(s): CKTOTAL, CKMB, CKMBINDEX, TROPONINI in the last 168 hours. BNP (last 3 results) No results for input(s): PROBNP in the last 8760 hours. HbA1C: No results for input(s): HGBA1C in the last 72 hours. CBG:  Recent Labs Lab 02/13/16 1939 02/13/16 2308 02/14/16 0404 02/14/16 0751 02/14/16 1131  GLUCAP 185* 196* 224* 169* 213*   Lipid Profile: No results for input(s): CHOL, HDL, LDLCALC, TRIG, CHOLHDL, LDLDIRECT in the last 72 hours. Thyroid Function Tests: No results for input(s): TSH, T4TOTAL, FREET4, T3FREE, THYROIDAB in the last 72 hours. Anemia Panel: No results for input(s): VITAMINB12, FOLATE, FERRITIN, TIBC, IRON, RETICCTPCT in the last 72 hours. Urine analysis:    Component Value Date/Time   COLORURINE YELLOW 02/08/2016 1514   APPEARANCEUR CLOUDY*  02/08/2016 1514   LABSPEC 1.010 02/08/2016 1514   PHURINE 5.0 02/08/2016 1514   GLUCOSEU NEGATIVE 02/08/2016 1514   HGBUR NEGATIVE 02/08/2016 1514   HGBUR negative 06/12/2007 0843   BILIRUBINUR NEGATIVE 02/08/2016 1514   KETONESUR NEGATIVE 02/08/2016 1514   PROTEINUR NEGATIVE 02/08/2016 1514   UROBILINOGEN 0.2 06/15/2015 0029   NITRITE NEGATIVE 02/08/2016 1514   LEUKOCYTESUR NEGATIVE 02/08/2016 1514   Sepsis Labs: (procalcitonin:4,lacticidven:4)  ) Recent Results (from the past 240 hour(s))  Blood Culture (routine x 2)     Status: None   Collection Time: 02/08/16  2:57 PM  Result Value Ref Range Status   Specimen Description BLOOD RIGHT ANTECUBITAL  Final   Special Requests BOTTLES DRAWN AEROBIC AND ANAEROBIC 5CC  Final   Culture NO GROWTH 5 DAYS  Final   Report Status 02/13/2016 FINAL  Final  Blood Culture (routine x 2)     Status: None   Collection Time: 02/08/16  3:02 PM  Result Value Ref Range Status   Specimen Description BLOOD LEFT ANTECUBITAL  Final   Special Requests BOTTLES DRAWN AEROBIC AND ANAEROBIC 5CC  Final   Culture NO GROWTH  5 DAYS  Final   Report Status 02/13/2016 FINAL  Final  Urine culture     Status: None   Collection Time: 02/08/16  3:14 PM  Result Value Ref Range Status   Specimen Description URINE, CLEAN CATCH  Final   Special Requests NONE  Final   Culture MULTIPLE SPECIES PRESENT, SUGGEST RECOLLECTION  Final   Report Status 02/10/2016 FINAL  Final  MRSA PCR Screening     Status: None   Collection Time: 02/09/16  1:45 AM  Result Value Ref Range Status   MRSA by PCR NEGATIVE NEGATIVE Final    Comment:        The GeneXpert MRSA Assay (FDA approved for NASAL specimens only), is one component of a comprehensive MRSA colonization surveillance program. It is not intended to diagnose MRSA infection nor to guide or monitor treatment for MRSA infections.   C difficile quick scan w PCR reflex     Status: None   Collection Time: 02/09/16  8:14 AM  Result Value Ref Range Status   C Diff antigen NEGATIVE NEGATIVE Final   C Diff toxin NEGATIVE NEGATIVE Final   C Diff interpretation Negative for toxigenic C. difficile  Final         Radiology Studies: No results found.      Scheduled Meds: . aspirin EC  81 mg Oral Daily  . atorvastatin  40 mg Oral q1800  . colchicine  0.6 mg Oral Daily  . enoxaparin (LOVENOX) injection  90 mg Subcutaneous Q24H  . febuxostat  40 mg Oral Daily  . fenofibrate  54 mg Oral Daily  . insulin aspart  0-15 Units Subcutaneous 6 times per day  . insulin detemir  30 Units Subcutaneous Daily  . insulin detemir  35 Units Subcutaneous QHS  . potassium chloride  40 mEq Oral BID  . sodium chloride flush  3 mL Intravenous Q12H  . torsemide  80 mg Oral BID  . zolpidem  5 mg Oral QHS   Continuous Infusions:     LOS: 5 days    Time spent: 7    Pleas Koch, MD [facility] ([phone]   If 7PM-7AM, please contact night-coverage [email or web address] Password [facility] 02/14/2016, 4:41 PM

## 2016-02-14 NOTE — Progress Notes (Signed)
Pt placed self on cpap for the night will monitor 

## 2016-02-14 NOTE — Progress Notes (Signed)
Inpatient Diabetes Program Recommendations  AACE/ADA: New Consensus Statement on Inpatient Glycemic Control (2015)  Target Ranges:  Prepandial:   less than 140 mg/dL      Peak postprandial:   less than 180 mg/dL (1-2 hours)      Critically ill patients:  140 - 180 mg/dL  Results for [name], [name] (MRN [id number]) as of 02/14/2016 10:59  Ref. Range 02/13/2016 04:22 02/13/2016 08:00 02/13/2016 11:40 02/13/2016 16:58 02/13/2016 19:39 02/13/2016 23:08 02/14/2016 04:04 02/14/2016 07:51  Glucose-Capillary Latest Ref Range: 65-99 mg/dL 155 (H) 208 (H) 022 (H) 208 (H) 185 (H) 196 (H) 224 (H) 169 (H)   Review of Glycemic Control Levemir 25 units QAM, Levemir 30 units QHS, Novolog 30-40 units TID with meals Current orders for Inpatient glycemic control: Levemir 30 units daily, Levemir 35 units QHS, Novolog 0-15 units Q4H  Inpatient Diabetes Program Recommendations:  Insulin - Basal: Patient's glucose has ranged from 127-235 mg/dl and patient has received a total of Novolog 33 units for correction over the past 24 hours. Please consider increasing Levemir to 35 units BID. Correction (SSI): In reviewing the chart, note that patient is eating well. Please consider changing frequency of CBGs and Novolog correction to ACHS. Insulin - Meal Coverage: Please consider ordering Novolog 5 units TID with meals for meal coverge (in addition to Novolog correction scale).  Thanks, Orlando Penner, RN, MSN, CDE Diabetes Coordinator Inpatient Diabetes Program [phone] (Team Pager from 8am to 5pm) [phone] (AP office) [phone] [facility] office) [phone] [facility] office)

## 2016-02-14 NOTE — Progress Notes (Signed)
Advanced Heart Failure Rounding Note  PCP: Dr Leonette Most [facility]) HF Cardiology: Dr. Shirlee Latch  Subjective:    Admitted 02/08/16 with 2 week history of diarrhea and hypotension after presenting to PCP appointment.  Gained 20 lbs in hospital due to fluid resuscitation.   Denies SOB, CP, lightheadedness, or dizziness.  Still feels like hands and feet are swollen.  Hesitant to go home with any amount of volume on board.   He is out 7.9 L and down 7 lbs from highest weight this admission. Creatinine continues to improve.   Objective:   Weight Range: 389 lb 12.8 oz (176.812 kg) Body mass index is 54.39 kg/(m^2).   Vital Signs:   Temp:  [97.6 F (36.4 C)-98 F (36.7 C)] 97.8 F (36.6 C) (04/18 0617) Pulse Rate:  [86-91] 91 (04/18 0617) Resp:  [18] 18 (04/18 0617) BP: (111-135)/(56-87) 135/87 mmHg (04/18 0617) SpO2:  [92 %-94 %] 92 % (04/18 0617) Weight:  [389 lb 12.8 oz (176.812 kg)] 389 lb 12.8 oz (176.812 kg) (04/18 0617) Last BM Date: 02/12/16  Weight change: Filed Weights   02/12/16 0443 02/13/16 0426 02/14/16 0617  Weight: 390 lb 4.8 oz (177.039 kg) 388 lb (175.996 kg) 389 lb 12.8 oz (176.812 kg)    Intake/Output:   Intake/Output Summary (Last 24 hours) at 02/14/16 0728 Last data filed at 02/14/16 0616  Gross per 24 hour  Intake    960 ml  Output   1400 ml  Net   -440 ml     Physical Exam: General: Morbidly obese.  Neck: Thick, JVP difficult to assess with patient girth. no thyromegaly or thyroid nodule.  Lungs: Dependent crackles, Clear anterior sounds. CV: Nondisplaced PMI. Heart regular S1/S2, no S3/S4, 1/6 SEM RUSB. Trace-1+ edema to knees bilaterally. No carotid bruit. Normal pedal pulses.  Abdomen: Morbidly obese, soft, NT, ND, no HSM. No bruits or masses. +BS  Skin: Intact without lesions or rashes.  Neurologic: Alert and oriented x 3.  Psych: Normal affect. Extremities: No clubbing or cyanosis. 1-2 plus edema. RLE with significant venous stasis  changes.  HEENT: Normal.   Telemetry: Not on tele.   Labs: CBC No results for input(s): WBC, NEUTROABS, HGB, HCT, MCV, PLT in the last 72 hours. Basic Metabolic Panel  Recent Labs  02/13/16 0800 02/14/16 0435  NA 141 140  K 3.4* 3.7  CL 95* 94*  CO2 37* 35*  GLUCOSE 125* 222*  BUN 27* 27*  CREATININE 1.27* 1.18  CALCIUM 8.8* 8.9  MG  --  2.0   Liver Function Tests  Recent Labs  02/12/16 0254  AST 16  ALT 15*  ALKPHOS 57  BILITOT 0.5  PROT 6.1*  ALBUMIN 3.1*   No results for input(s): LIPASE, AMYLASE in the last 72 hours. Cardiac Enzymes No results for input(s): CKTOTAL, CKMB, CKMBINDEX, TROPONINI in the last 72 hours.  BNP: BNP (last 3 results)  Recent Labs  01/01/16 1108 01/02/16 0652 02/08/16 1513  BNP 196.0* 173.7* 29.9    ProBNP (last 3 results) No results for input(s): PROBNP in the last 8760 hours.   D-Dimer No results for input(s): DDIMER in the last 72 hours. Hemoglobin A1C No results for input(s): HGBA1C in the last 72 hours. Fasting Lipid Panel No results for input(s): CHOL, HDL, LDLCALC, TRIG, CHOLHDL, LDLDIRECT in the last 72 hours. Thyroid Function Tests No results for input(s): TSH, T4TOTAL, T3FREE, THYROIDAB in the last 72 hours.  Invalid input(s): FREET3  Other results:  Imaging/Studies:  No results found.  Latest Echo  Latest Cath   Medications:     Scheduled Medications: . aspirin EC  81 mg Oral Daily  . atorvastatin  40 mg Oral q1800  . colchicine  0.6 mg Oral Daily  . enoxaparin (LOVENOX) injection  90 mg Subcutaneous Q24H  . febuxostat  40 mg Oral Daily  . fenofibrate  54 mg Oral Daily  . insulin aspart  0-15 Units Subcutaneous 6 times per day  . insulin detemir  30 Units Subcutaneous Daily  . insulin detemir  35 Units Subcutaneous QHS  . potassium chloride  40 mEq Oral BID  . sodium chloride flush  3 mL Intravenous Q12H  . torsemide  80 mg Oral BID  . zolpidem  5 mg Oral QHS    Infusions:     PRN Medications: acetaminophen **OR** acetaminophen, albuterol, HYDROcodone-acetaminophen, ondansetron **OR** ondansetron (ZOFRAN) IV, oxyCODONE, polyethylene glycol   Assessment/Plan   Admitted 02/08/16 with presumed dehydration from diarrhea x 2 weeks.  Treated with IVF and now volume overloaded. HF team consulted for medication adjustments.   1. Acute on chronic diastolic CHF - He remains mildly volume overloaded. Creatine stable.  Will give 5 mg metolazone with extra 40 meq of K this am.  -  Continue torsemide 80 mg BID and K 40 meq BID.  2. AKI on CKD Stage III - Continues to improve. - Continue to follow closely with diuresis.  3. Bioprosthetic aortic valve - Stable on most recent echo 4. Morbid Obesity - Needs to limit portion sizes and increase activity.  - Has been seen by PT/OT. HHPT/OT recommended. 5. OSA - Continue CPAP nightly 6. DM - Per primary team.   Home tomorrow vs Thursday. If doesn't diurese well with addition of metolazone today, may need IV lasix x 1 prior to d/c. Pt anxious about going home with any level of volume on board.   Length of Stay: 5   [name] PA-C 02/14/2016, 7:28 AM  Advanced Heart Failure Team Pager [phone] (M-F; 7a - 4p)  Please contact [facility] for night-coverage after hours (4p -7a ) and weekends on [email or web address]  Patient seen with PA, agree with the above note.  Creatinine coming down.  Weight remains above baseline.  Will give metolazone today, possibly home tomorrow depending on how volume looks.   [name] 02/14/2016 1:26 PM

## 2016-02-15 DIAGNOSIS — I5032 Chronic diastolic (congestive) heart failure: Secondary | ICD-10-CM

## 2016-02-15 DIAGNOSIS — R197 Diarrhea, unspecified: Secondary | ICD-10-CM

## 2016-02-15 DIAGNOSIS — E860 Dehydration: Secondary | ICD-10-CM

## 2016-02-15 DIAGNOSIS — I350 Nonrheumatic aortic (valve) stenosis: Secondary | ICD-10-CM

## 2016-02-15 LAB — GLUCOSE, CAPILLARY
GLUCOSE-CAPILLARY: 201 mg/dL — AB (ref 65–99)
GLUCOSE-CAPILLARY: 166 mg/dL — AB (ref 65–99)
GLUCOSE-CAPILLARY: 149 mg/dL — AB (ref 65–99)
GLUCOSE-CAPILLARY: 250 mg/dL — AB (ref 65–99)
GLUCOSE-CAPILLARY: 287 mg/dL — AB (ref 65–99)
Glucose-Capillary: 241 mg/dL — ABNORMAL HIGH (ref 65–99)
Glucose-Capillary: 261 mg/dL — ABNORMAL HIGH (ref 65–99)

## 2016-02-15 LAB — BASIC METABOLIC PANEL
ANION GAP: 13 (ref 5–15)
BUN: 29 mg/dL — ABNORMAL HIGH (ref 6–20)
CHLORIDE: 88 mmol/L — AB (ref 101–111)
CO2: 39 mmol/L — ABNORMAL HIGH (ref 22–32)
CREATININE: 1.28 mg/dL — AB (ref 0.61–1.24)
Calcium: 9.3 mg/dL (ref 8.9–10.3)
GFR calc non Af Amer: 57 mL/min — ABNORMAL LOW (ref >60)
Glucose, Bld: 185 mg/dL — ABNORMAL HIGH (ref 65–99)
POTASSIUM: 3.6 mmol/L (ref 3.5–5.1)
SODIUM: 140 mmol/L (ref 135–145)

## 2016-02-15 MED ORDER — POTASSIUM CHLORIDE CRYS ER 20 MEQ PO TBCR
40.0000 meq | EXTENDED_RELEASE_TABLET | Freq: Once | ORAL | Status: AC
  Administered 2016-02-15: 40 meq via ORAL
  Filled 2016-02-15: qty 2

## 2016-02-15 MED ORDER — METOLAZONE 2.5 MG PO TABS
2.5000 mg | ORAL_TABLET | Freq: Once | ORAL | Status: AC
  Administered 2016-02-15: 2.5 mg via ORAL
  Filled 2016-02-15: qty 1

## 2016-02-15 NOTE — Progress Notes (Signed)
PROGRESS NOTE    Antonio [name]  ZOX:[id number] DOB: 1951-02-16 DOA: 02/08/2016 PCP: [name], MD  Outpatient Specialists:   Cardiology ACHF team  Brief Narrative:  65 y/o ? OSA on CPAP ? 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   Admitted initallyWith concerns for sepsis probably secondary to pneumonia and received 3 days of IV antibiotics Was 5 L positive and was then semi-aggressively diuresed and advanced heart failure team gave guidance with regards to management.  Subjective: states does not have transported to go home. Feels not back to his baseline weight  Doing fair No new issues diuresing Watched full movie marathon last night-slept at 04:00 so tire dthis am No diarr no cp  Assessment & Plan:   Active Problems:   Hyperlipidemia   Aortic stenosis   PAF (paroxysmal atrial fibrillation) (HCC)   Chronic diastolic CHF (congestive heart failure) (HCC)   OSA on CPAP   Insulin dependent diabetes mellitus (HCC)   Obesity hypoventilation syndrome (HCC)   Acute renal failure superimposed on stage 3 chronic kidney disease (HCC)   Diarrhea   Dehydration   Acute-on-chronic kidney injury (HCC)   Acute on chronic diastolic HF (heart failure) (HCC)   Chronic diastolic CHF (congestive heart failure) (HCC) - evidence of dehydration initially-  resume torsemide/metalozone and adjust perACHF team who know him well -9.78  so far wght 396-->393-->390-->388-->389 As per cardiology patient could be discharged today or tomorrow home and follow-up as an outpatient  . Acute renal failure superimposed on pre-existing cardiorenal syndrome and a  setting of on stage 3 chronic kidney disease (HCC) ,  baseline 1.5, 2.28 >1.8>1.55>1.27>>>1.18>>1.28 initially vol resuscitated, continue diuresing Labs am   diarrhea-completely resolved  . Hyperlipidemia  -stable continue home medication  . Obesity hypoventilation syndrome (HCC) on oxygen at home will continue-  is scheduled for sleep study stone and this may benefit him in terms of managing his volume and elevated PA pressures   . PAF (paroxysmal atrial fibrillation) (HCC) PSVT  patient denies history of atrial fibrillation not on any anticoagulation history father clarified   . Diarrhea obtain stool studies, likely viral gastroenteritis, C. difficile negative , GI pathogen panel   . Dehydration -likely secondary to diarrhea , resolved   Diabetes mellitus  continue Levemir 30 am, 35 U pm , start patient on sliding scale insulin  -mod control 169--224   lower extremities appear to be cyanotic -2/2 to hemorrhagic bullae from volume.   Will resolve with diuresis- -add Mag sulphate dressing to dry it up--not available per pharmacy   Super morbid obesity Body mass index is 53.16 kg/(m^2). We focused priorBariatric options.  He has lost upto 70 lbs before He will benefit from further OP discussion-unclear how motivated he will be in OP setting     DVT prophylaxsis Lovenox   DVT prophylaxis: Lovenox Code Status: Full Family Communication: none Disposition Plan: home when at dry weight   Consultants:   Cardiology  Procedures:   VQ  Antimicrobials:   vanc 02/08/16-->4.14  Zosyn 02/08/16 -->4.14   Objective: Filed Vitals:   02/14/16 2230 02/15/16 0201 02/15/16 0500 02/15/16 1230  BP: 141/73 126/73 125/87 121/66  Pulse:  95 98 93 97  Temp: 97.8 F (36.6 C) 97.6 F (36.4 C) 98.2 F (36.8 C) 98.4 F (36.9 C)  TempSrc: Oral Oral Oral Oral  Resp: Height:      Weight:   172.82 kg (381 lb)   SpO2: 94% 93% 95% 95%    Intake/Output Summary  (Last 24 hours) at 02/15/16 1516 Last data filed at 02/15/16 1312  Gross per 24 hour  Intake   1800 ml  Output   4975 ml  Net  -3175 ml   Filed Weights   02/13/16 0426 02/14/16 0617 02/15/16 0500  Weight: 175.996 kg (388 lb) 176.812 kg (389 lb 12.8 oz) 172.82 kg (381 lb)    Examination:  General exam: Comfortable  Respiratory system: Clear to auscultation. Respiratory effort normal. Cardiovascular system: S1 & S2 heard, RRR. Cannot assess JVD,  Cannot assess murmurs, rubs, gallops or clicks. . Gastrointestinal system: Abdomen is obese Central nervous system: Alert and oriented. No focal neurological deficits. Extremities: Symmetric 5 x 5 power. Skin: 3+ edema + anasarca, bullae to R gr8 toe Psychiatry: Judgement and insight appear normal. Mood & affect appropriate.     Data Reviewed: I have personally reviewed following labs and imaging studies  CBC:  Recent Labs Lab 02/09/16 0439 02/10/16 0515 02/11/16 0218  WBC 8.7 8.7 9.0  HGB 11.5* 11.8* 11.8*  HCT 38.5* 39.4 37.1*  MCV 88.5 87.6 86.7  PLT 251 258 261   Basic Metabolic Panel:  Recent Labs Lab 02/09/16 0439  02/11/16 0218 02/12/16 0254 02/13/16 0800 02/14/16 0435 02/15/16 0307  NA 141  < > 141 140 141 140 140  K 4.5  < > 4.3 3.7 3.4* 3.7 3.6  CL 98*  < > 102 96* 95* 94* 88*  CO2 32  < > 29 33* 37* 35* 39*  GLUCOSE 261*  < > 141* 187* 125* 222* 185*  BUN 47*  < > 23* 28* 27* 27* 29*  CREATININE 1.80*  < > 1.31* 1.55* 1.27* 1.18 1.28*  CALCIUM 8.4*  < > 9.0 8.7* 8.8* 8.9 9.3  MG 2.3  --   --   --   --  2.0  --   PHOS 4.3  --   --   --   --   --   --   < > = values in this interval not displayed. GFR: Estimated Creatinine Clearance: 93 mL/min (by C-G formula based on Cr of 1.28). Liver Function Tests:  Recent Labs Lab 02/09/16 0439 02/10/16 0515 02/12/16 0254  AST ALT 13* 15* 15*  ALKPHOS 56 62 57  BILITOT 0.6 0.7 0.5  PROT 5.9* 6.4* 6.1*  ALBUMIN 2.9* 3.3* 3.1*   No results for  input(s): LIPASE, AMYLASE in the last 168 hours. No results for input(s): AMMONIA in the last 168 hours. Coagulation Profile: No results for input(s): INR, PROTIME in the last 168 hours. Cardiac Enzymes: No results for input(s): CKTOTAL, CKMB, CKMBINDEX, TROPONINI in the last 168 hours. BNP (last 3 results) No results for input(s): PROBNP in the last 8760 hours. HbA1C: No results for input(s): HGBA1C in the last 72 hours. CBG:  Recent Labs Lab 02/14/16 2303 02/15/16 0404 02/15/16 0504 02/15/16 0800 02/15/16 1213  GLUCAP 239* 201* 166* 149* 261*   Lipid Profile: No results for input(s): CHOL, HDL, LDLCALC, TRIG, CHOLHDL, LDLDIRECT in the last 72 hours. Thyroid Function Tests: No results for input(s): TSH, T4TOTAL, FREET4, T3FREE, THYROIDAB in  the last 72 hours. Anemia Panel: No results for input(s): VITAMINB12, FOLATE, FERRITIN, TIBC, IRON, RETICCTPCT in the last 72 hours. Urine analysis:    Component Value Date/Time   COLORURINE YELLOW 02/08/2016 1514   APPEARANCEUR CLOUDY* 02/08/2016 1514   LABSPEC 1.010 02/08/2016 1514   PHURINE 5.0 02/08/2016 1514   GLUCOSEU NEGATIVE 02/08/2016 1514   HGBUR NEGATIVE 02/08/2016 1514   HGBUR negative 06/12/2007 0843   BILIRUBINUR NEGATIVE 02/08/2016 1514   KETONESUR NEGATIVE 02/08/2016 1514   PROTEINUR NEGATIVE 02/08/2016 1514   UROBILINOGEN 0.2 06/15/2015 0029   NITRITE NEGATIVE 02/08/2016 1514   LEUKOCYTESUR NEGATIVE 02/08/2016 1514   Sepsis Labs: @LABRCNTIP (procalcitonin:4,lacticidven:4)  ) Recent Results (from the past 240 hour(s))  Blood Culture (routine x 2)     Status: None   Collection Time: 02/08/16  2:57 PM  Result Value Ref Range Status   Specimen Description BLOOD RIGHT ANTECUBITAL  Final   Special Requests BOTTLES DRAWN AEROBIC AND ANAEROBIC 5CC  Final   Culture NO GROWTH 5 DAYS  Final   Report Status 02/13/2016 FINAL  Final  Blood Culture (routine x 2)     Status: None   Collection Time: 02/08/16  3:02 PM   Result Value Ref Range Status   Specimen Description BLOOD LEFT ANTECUBITAL  Final   Special Requests BOTTLES DRAWN AEROBIC AND ANAEROBIC 5CC  Final   Culture NO GROWTH 5 DAYS  Final   Report Status 02/13/2016 FINAL  Final  Urine culture     Status: None   Collection Time: 02/08/16  3:14 PM  Result Value Ref Range Status   Specimen Description URINE, CLEAN CATCH  Final   Special Requests NONE  Final   Culture MULTIPLE SPECIES PRESENT, SUGGEST RECOLLECTION  Final   Report Status 02/10/2016 FINAL  Final  MRSA PCR Screening     Status: None   Collection Time: 02/09/16  1:45 AM  Result Value Ref Range Status   MRSA by PCR NEGATIVE NEGATIVE Final    Comment:        The GeneXpert MRSA Assay (FDA approved for NASAL specimens only), is one component of a comprehensive MRSA colonization surveillance program. It is not intended to diagnose MRSA infection nor to guide or monitor treatment for MRSA infections.   C difficile quick scan w PCR reflex     Status: None   Collection Time: 02/09/16  8:14 AM  Result Value Ref Range Status   C Diff antigen NEGATIVE NEGATIVE Final   C Diff toxin NEGATIVE NEGATIVE Final   C Diff interpretation Negative for toxigenic C. difficile  Final         Radiology Studies: No results found.      Scheduled Meds: . aspirin EC  81 mg Oral Daily  . atorvastatin  40 mg Oral q1800  . colchicine  0.6 mg Oral Daily  . enoxaparin (LOVENOX) injection  90 mg Subcutaneous Q24H  . febuxostat  40 mg Oral Daily  . fenofibrate  54 mg Oral Daily  . insulin aspart  0-15 Units Subcutaneous 6 times per day  . insulin detemir  30 Units Subcutaneous Daily  . insulin detemir  35 Units Subcutaneous QHS  . potassium chloride  40 mEq Oral BID  . sodium chloride flush  3 mL Intravenous Q12H  . torsemide  80 mg Oral BID  . zolpidem  5 mg Oral QHS   Continuous Infusions:     LOS: 6 days    Time spent: 25   If 7PM-7AM,  please contact  night-coverage [email or web address] Password [facility] 02/15/2016, 3:16 PM

## 2016-02-15 NOTE — Progress Notes (Signed)
OT Cancellation Note  Patient Details Name: Antonio [name] MRN: [id number] DOB: [birth year]   Cancelled Treatment:    Reason Eval/Treat Not Completed: Patient declined, no reason specified. Pt found in bed with eyes closed. Pt refused this OT session secondary to stating he was too tired and too sleepy secondary to not getting any sleep last night. Will check back as schedule allows, possibly tomorrow.   Edwin Cap , MS, OTR/L, CLT Pager: [phone]  02/15/2016, 12:26 PM

## 2016-02-15 NOTE — Care Management Note (Signed)
Case Management Note  Patient Details  Name: Antonio [name] MRN: [id number] Date of Birth: 01-03-1951  Subjective/Objective:      CHF              Action/Plan: Discharge Planning:  Chart reviewed. NCM spoke to pt at bedside. States he is active with [facility] PT, OT and RN. He has oxygen, CPAP and RW at home. Lives alone, He has two ex-wives that will assist as needed. Will continue to follow and send resumption of care orders to [facility] when they become available.    Oren Bracket MD  Expected Discharge Date:                  Expected Discharge Plan:  Home w Home Health Services  In-House Referral:  NA  Discharge planning Services  CM Consult  Post Acute Care Choice:  Home Health Choice offered to:  Patient  DME Arranged:  N/A DME Agency:  NA  HH Arranged:  RN HH Agency:     Status of Service:  In process, will continue to follow  Medicare Important Message Given:  Yes Date Medicare IM Given:    Medicare IM give by:    Date Additional Medicare IM Given:    Additional Medicare Important Message give by:     If discussed at Long Length of Stay Meetings, dates discussed:    Additional Comments: Pt states he already has tub bench Cherylann Parr, RN 02/15/2016, 12:00 PM

## 2016-02-15 NOTE — Progress Notes (Signed)
Pt states he will place himself on CPAP later tonight. All equipment is available at bedside.

## 2016-02-15 NOTE — Progress Notes (Signed)
Occupational Therapy Treatment Patient Details Name: RAJESH DHALIWAL MRN: [id number] DOB: 11/19/1950 Today's Date: 02/15/2016    History of present illness 65 y.o. male has a past medical history of Blindness of right eye; MRSA infection; Anxiety; Aortic stenosis; HTN (hypertension); HLD (hyperlipidemia); Gout; Chronic diastolic heart failure (HCC); DM2 (diabetes mellitus, type 2), OSA on CPAP; Hepatitis; Arthritis; and History of acute renal failure, blindness in right eye, and back surgery. Pt admitted due to hypotension and also had been having diarrhea.   OT comments  Patient making good progress towards OT goals, continue plan of care for now.   Follow Up Recommendations  Home health OT;Supervision - Intermittent    Equipment Recommendations  Tub/shower bench    Recommendations for Other Services  None at this time  Precautions / Restrictions Precautions Precautions: Other (comment) Precaution Comments: watch SaO2 Restrictions Weight Bearing Restrictions: No    Mobility Bed Mobility Overal bed mobility: Modified Independent (supine to sit) General bed mobility comments: HOB slightly elevated and use of bed rails   Transfers Overall transfer level: Modified independent   Transfers: Sit to/from [employer] transfer comment: increased time due to generalized weakness and pain    Balance Overall balance assessment: No apparent balance deficits (not formally assessed)   ADL Overall ADL's : Needs assistance/impaired General ADL Comments: Pt found supine in bed and tired, but willing to work with therapist. Pt engaged in bed mobility and sat EOB. Pt transferred EOB to recliner and enaged in BUE strengthening HEP using green therapband, see below under exercises for more information.            Cognition   Behavior During Therapy: WFL for tasks assessed/performed Overall Cognitive Status: Within Functional Limits for tasks assessed     Exercises  General Exercises - Upper Extremity Shoulder ABduction: Strengthening;Both;10 reps;Theraband;Seated Theraband Level (Shoulder Abduction): Level 3 (Green) Shoulder ADduction: Strengthening;Both;10 reps;Seated;Theraband Theraband Level (Shoulder Adduction): Level 3 (Green) Shoulder Horizontal ABduction: Strengthening;Both;10 reps;Seated;Theraband Theraband Level (Shoulder Horizontal Abduction): Level 3 (Green) Shoulder Horizontal ADduction: Strengthening;Both;10 reps;Seated;Theraband Theraband Level (Shoulder Horizontal Adduction): Level 3 (Green) Elbow Flexion: Strengthening;Both;10 reps;Theraband;Seated Theraband Level (Elbow Flexion): Level 3 (Green) Elbow Extension: Strengthening;Both;10 reps;Seated;Theraband Theraband Level (Elbow Extension): Level 3 (Green) General Exercises - Lower Extremity Ankle Circles/Pumps: Strengthening;Both;10 reps;Seated (level green theraband)           Pertinent Vitals/ Pain       Pain Assessment: 0-10 Faces Pain Scale: Hurts a little bit Pain Location: Low back and L hip  Pain Descriptors / Indicators: Grimacing;Guarding Pain Intervention(s): Limited activity within patient's tolerance;Monitored during session   Frequency Min 2X/week     Progress Toward Goals  OT Goals(current goals can now befound in the care plan section)  Progress towards OT goals: Progressing toward goals  Acute Rehab OT Goals Patient Stated Goal: go home tomorrow OT Goal Formulation: With patient Time For Goal Achievement: 02/19/16 Potential to Achieve Goals: Good  Plan Discharge plan remains appropriate    End of Session Equipment Utilized During Treatment: Oxygen (4L/min)   Activity Tolerance Patient tolerated treatment well   Patient Left in chair;with call bell/phone within reach    Time: 6979-4801 OT Time Calculation (min): 22 min  Charges: OT General Charges $OT Visit: 1 Procedure OT Treatments $Therapeutic Exercise: 8-22 mins  Edwin Cap , MS,  OTR/L, CLT Pager: [phone]  02/15/2016, 3:29 PM

## 2016-02-15 NOTE — Progress Notes (Signed)
Advanced Heart Failure Rounding Note  PCP: Dr Leonette Most [facility]) HF Cardiology: Dr. Shirlee Latch  Subjective:    Admitted 02/08/16 with 2 week history of diarrhea and hypotension after presenting to PCP appointment.  Gained 20 lbs in hospital due to fluid resuscitation.   Had vigorous urine output with metolazone.  Feels a lot better. R foot still swollen, says it is always "the last to go" when fluid is coming off. Denies SOB or CP.  Says he does not have transport or help at home available until tomorrow.   He was out 3.2 L and down 7 lbs overnight. Down 14 lbs from highest weight this admission. Creatinine relatively stable with metolazone yesterday.    Objective:   Weight Range: 381 lb (172.82 kg) Body mass index is 53.16 kg/(m^2).   Vital Signs:   Temp:  [97.6 F (36.4 C)-98.2 F (36.8 C)] 98.2 F (36.8 C) (04/19 0500) Pulse Rate:  [93-98] 93 (04/19 0500) Resp:  [18-20] 20 (04/19 0500) BP: (120-141)/(70-87) 125/87 mmHg (04/19 0500) SpO2:  [93 %-95 %] 95 % (04/19 0500) Weight:  [381 lb (172.82 kg)] 381 lb (172.82 kg) (04/19 0500) Last BM Date: 02/14/16  Weight change: Filed Weights   02/13/16 0426 02/14/16 0617 02/15/16 0500  Weight: 388 lb (175.996 kg) 389 lb 12.8 oz (176.812 kg) 381 lb (172.82 kg)    Intake/Output:   Intake/Output Summary (Last 24 hours) at 02/15/16 0720 Last data filed at 02/15/16 0209  Gross per 24 hour  Intake   1800 ml  Output   4400 ml  Net  -2600 ml     Physical Exam: General: Morbidly obese.  Neck: Thick, JVP difficult to assess with patient girth. no thyromegaly or thyroid nodule.  Lungs: CTAB, normal effort. CV: Nondisplaced PMI. Heart regular S1/S2, no S3/S4, 1/6 SEM RUSB. No carotid bruit. Normal pedal pulses.  Abdomen: Morbidly obese, soft, non-tender, non-distended, no HSM. No bruits or masses. +BS  Skin: Intact without lesions or rashes.  Neurologic: Alert and oriented x 3.  Psych: Normal affect. Extremities: No  clubbing or cyanosis. 1+ ankle edema RLE,  RLE with significant venous stasis changes.  HEENT: Normal.   Telemetry: Not on tele.   Labs: CBC No results for input(s): WBC, NEUTROABS, HGB, HCT, MCV, PLT in the last 72 hours. Basic Metabolic Panel  Recent Labs  02/14/16 0435 02/15/16 0307  NA 140 140  K 3.7 3.6  CL 94* 88*  CO2 35* 39*  GLUCOSE 222* 185*  BUN 27* 29*  CREATININE 1.18 1.28*  CALCIUM 8.9 9.3  MG 2.0  --    Liver Function Tests No results for input(s): AST, ALT, ALKPHOS, BILITOT, PROT, ALBUMIN in the last 72 hours. No results for input(s): LIPASE, AMYLASE in the last 72 hours. Cardiac Enzymes No results for input(s): CKTOTAL, CKMB, CKMBINDEX, TROPONINI in the last 72 hours.  BNP: BNP (last 3 results)  Recent Labs  01/01/16 1108 01/02/16 0652 02/08/16 1513  BNP 196.0* 173.7* 29.9    ProBNP (last 3 results) No results for input(s): PROBNP in the last 8760 hours.   D-Dimer No results for input(s): DDIMER in the last 72 hours. Hemoglobin A1C No results for input(s): HGBA1C in the last 72 hours. Fasting Lipid Panel No results for input(s): CHOL, HDL, LDLCALC, TRIG, CHOLHDL, LDLDIRECT in the last 72 hours. Thyroid Function Tests No results for input(s): TSH, T4TOTAL, T3FREE, THYROIDAB in the last 72 hours.  Invalid input(s): FREET3  Other results:  Imaging/Studies:  No results found.  Latest Echo  Latest Cath   Medications:     Scheduled Medications: . aspirin EC  81 mg Oral Daily  . atorvastatin  40 mg Oral q1800  . colchicine  0.6 mg Oral Daily  . enoxaparin (LOVENOX) injection  90 mg Subcutaneous Q24H  . febuxostat  40 mg Oral Daily  . fenofibrate  54 mg Oral Daily  . insulin aspart  0-15 Units Subcutaneous 6 times per day  . insulin detemir  30 Units Subcutaneous Daily  . insulin detemir  35 Units Subcutaneous QHS  . potassium chloride  40 mEq Oral BID  . sodium chloride flush  3 mL Intravenous Q12H  . torsemide  80 mg  Oral BID  . zolpidem  5 mg Oral QHS    Infusions:    PRN Medications: acetaminophen **OR** acetaminophen, albuterol, HYDROcodone-acetaminophen, ondansetron **OR** ondansetron (ZOFRAN) IV, oxyCODONE, polyethylene glycol   Assessment/Plan   Admitted 02/08/16 with presumed dehydration from diarrhea x 2 weeks.  Treated with IVF and now volume overloaded. HF team consulted for medication adjustments.   1. Acute on chronic diastolic CHF - He diuresed well and is back near his previous baseline with metolazone yesterday.  Will give him smaller dose of metolazone at 2.5 mg today with extra 40 meq of potassium to get last 5-6 lbs off.  -  Continue torsemide 80 mg BID and K 40 meq BID.  2. AKI on CKD Stage III - Continues to improve. - Continue to follow closely with diuresis.  3. Bioprosthetic aortic valve - Stable on most recent echo 4. Morbid Obesity - Needs to limit portion sizes and increase activity.  - Has been seen by PT/OT. HHPT/OT recommended. 5. OSA - Continue CPAP nightly 6. DM - Per primary team.  7. HTN - He wishes to remain off of ACE/ARB for now with hypotension coming in.  Can try adding back at low dose, or we can address at his follow up.   OK for home later today vs tomorrow am from HF standpoint. HF follow up scheduled for 02/28/16.  Home HF Meds Torsemide 80 mg BID Potassium 40 meq BID Metolazone 2.5 mg AS NEEDED for weight gains of 3 lbs overnight or 5 lbs within one week.   Length of Stay: 6   [name] PA-C 02/15/2016, 7:20 AM  Advanced Heart Failure Team Pager [phone] (M-F; 7a - 4p)  Please contact [facility] for night-coverage after hours (4p -7a ) and weekends on [email or web address]  Patient seen with PA, agree with the above note.  One more dose of metolazone today, then should be near his baseline.  He could go home tonight or tomorrow from our standpoint.  Followup arranged.   [name] 02/15/2016 11:56 AM

## 2016-02-16 LAB — GLUCOSE, CAPILLARY
Glucose-Capillary: 246 mg/dL — ABNORMAL HIGH (ref 65–99)
Glucose-Capillary: 184 mg/dL — ABNORMAL HIGH (ref 65–99)
Glucose-Capillary: 368 mg/dL — ABNORMAL HIGH (ref 65–99)
Glucose-Capillary: 322 mg/dL — ABNORMAL HIGH (ref 65–99)

## 2016-02-16 LAB — BASIC METABOLIC PANEL
ANION GAP: 15 (ref 5–15)
BUN: 34 mg/dL — ABNORMAL HIGH (ref 6–20)
CALCIUM: 9.2 mg/dL (ref 8.9–10.3)
CO2: 37 mmol/L — ABNORMAL HIGH (ref 22–32)
CREATININE: 1.38 mg/dL — AB (ref 0.61–1.24)
Chloride: 87 mmol/L — ABNORMAL LOW (ref 101–111)
GFR, EST NON AFRICAN AMERICAN: 52 mL/min — AB (ref >60)
Glucose, Bld: 225 mg/dL — ABNORMAL HIGH (ref 65–99)
Potassium: 3.6 mmol/L (ref 3.5–5.1)
SODIUM: 139 mmol/L (ref 135–145)

## 2016-02-16 LAB — CBC
HEMATOCRIT: 40.4 % (ref 39.0–52.0)
Hemoglobin: 12.4 g/dL — ABNORMAL LOW (ref 13.0–17.0)
MCH: 26.5 pg (ref 26.0–34.0)
MCHC: 30.7 g/dL (ref 30.0–36.0)
MCV: 86.3 fL (ref 78.0–100.0)
Platelets: 248 10*3/uL (ref 150–400)
RBC: 4.68 MIL/uL (ref 4.22–5.81)
RDW: 15.2 % (ref 11.5–15.5)
WBC: 9.2 10*3/uL (ref 4.0–10.5)

## 2016-02-16 MED ORDER — PREDNISONE 20 MG PO TABS: 40.0000 mg | ORAL_TABLET | Freq: Every day | ORAL | Status: DC

## 2016-02-16 MED ORDER — METHYLPREDNISOLONE SODIUM SUCC 125 MG IJ SOLR
60.0000 mg | Freq: Once | INTRAMUSCULAR | Status: AC
  Administered 2016-02-16: 60 mg via INTRAVENOUS
  Filled 2016-02-16: qty 2

## 2016-02-16 MED ORDER — POTASSIUM CHLORIDE CRYS ER 20 MEQ PO TBCR
40.0000 meq | EXTENDED_RELEASE_TABLET | Freq: Once | ORAL | Status: AC
  Administered 2016-02-16: 40 meq via ORAL

## 2016-02-16 MED ORDER — POTASSIUM CHLORIDE CRYS ER 20 MEQ PO TBCR: 40.0000 meq | EXTENDED_RELEASE_TABLET | Freq: Once | ORAL | Status: DC

## 2016-02-16 MED ORDER — PREDNISONE 20 MG PO TABS: 40.0000 mg | ORAL_TABLET | Freq: Every day | ORAL | Status: AC

## 2016-02-16 MED ORDER — PREDNISONE 20 MG PO TABS
40.0000 mg | ORAL_TABLET | Freq: Every day | ORAL | Status: DC
  Administered 2016-02-16: 40 mg via ORAL
  Filled 2016-02-16: qty 2

## 2016-02-16 NOTE — Progress Notes (Addendum)
Physical Therapy Treatment Patient Details Name: Antonio [name] MRN: [id number] DOB: [birth year] Today's Date: 02/16/2016    History of Present Illness 65 y.o. male has a past medical history of Blindness of right eye; MRSA infection; Anxiety; Aortic stenosis; HTN (hypertension); HLD (hyperlipidemia); Gout; Chronic diastolic heart failure (HCC); DM2 (diabetes mellitus, type 2), OSA on CPAP; Hepatitis; Arthritis; and History of acute renal failure, blindness in right eye, and back surgery. Pt admitted due to hypotension and also had been having diarrhea.    PT Comments    Attempted stair training several hours after pt received IV meds for his knee gout. He was unable to bear enough weight on either leg to step up a single step (one leg too painful, other leg too weak). Discussed discharge options at length with patient and Antonio [name], Case Manager. Patient could benefit from skilled nursing facility for therapy, however he stated during eval (and repeated today) that he has no more "rehab days." (He was in SNF 100 days and has only been out of SNF ~6 weeks). Today he reports his daughter contacted insurance and he has "nursing care" days. Case Manager to follow-up.  If patient cannot go to SNF, he will need transport home via ambulance. He will also need HHPT.    Follow Up Recommendations  SNF (assist for mobility)     Equipment Recommendations  None recommended by PT    Recommendations for Other Services       Precautions / Restrictions Precautions Precautions: Other (comment) Precaution Comments: watch SaO2 Restrictions Weight Bearing Restrictions: No    Mobility  Bed Mobility                  Transfers Overall transfer level: Needs assistance Equipment used: Rolling walker (2 wheeled) Transfers: Sit to/from Stand Sit to Stand: Min guard         General transfer comment: increased time due to generalized weakness and pain; nearly needed assist however was able to  push through  Ambulation/Gait Ambulation/Gait assistance: Min guard Ambulation Distance (Feet): 8 Feet Assistive device: Rolling walker (2 wheeled) Gait Pattern/deviations: Step-to pattern;Decreased stride length;Decreased weight shift to right Gait velocity: decreased       Stairs Stairs:  (attempted) Stairs assistance: Min assist Stair Management:  (simulated Rt rail with counter on right) Number of Stairs: 0 General stair comments: single step taken to pt's room; attempted to step up x 3 (with either leg) and pt unable to tolerate pain from gout in his "good knee" that usually boosts him up the step and other leg too weak to ascend step  Wheelchair Mobility    Modified Rankin (Stroke Patients Only)       Balance                                    Cognition Arousal/Alertness: Awake/alert Behavior During Therapy: WFL for tasks assessed/performed Overall Cognitive Status: Within Functional Limits for tasks assessed                      Exercises      General Comments General comments (skin integrity, edema, etc.): multiple conversations with MD, Case Manager and patient re: discharge plans      Pertinent Vitals/Pain Pain Assessment: Faces Faces Pain Scale: Hurts even more Pain Location: Rt knee Pain Descriptors / Indicators: Stabbing Pain Intervention(s): Limited activity within patient's tolerance;Monitored during session;Premedicated before session (pt  had IV pain meds and ?steroid)    Home Living                      Prior Function            PT Goals (current goals can now be found in the care plan section) Acute Rehab PT Goals Patient Stated Goal: be able to walk to his truck and drive again Time For Goal Achievement: 02/17/16 Progress towards PT goals: Not progressing toward goals - comment (gout in knee has flared up)    Frequency  Min 3X/week    PT Plan Discharge plan needs to be updated    Co-evaluation              End of Session Equipment Utilized During Treatment: Oxygen (pt too large for gait belt) Activity Tolerance: Patient limited by pain Patient left: in chair;with call bell/phone within reach     Time: 1434-1452 PT Time Calculation (min) (ACUTE ONLY): 18 min  Charges:  $Gait Training: 8-22 mins                    G Codes:      Antonio [name] 2016/03/08, 3:13 PM Pager [phone]

## 2016-02-16 NOTE — Discharge Summary (Addendum)
Physician Discharge Summary  Antonio [name] ZOX:[id number] DOB: [birth year] DOA: 02/08/2016  PCP: [name], MD  Admit date: 02/08/2016 Discharge date: 02/16/2016  Time spent:  Recommendations for Outpatient Follow-up:  1. Follow-up with primary care physician in one week 2. Follow-up in congestive heart failure clinic     Discharge Diagnoses:  Active Problems:   Hyperlipidemia   Aortic stenosis   PAF (paroxysmal atrial fibrillation) (HCC)   Chronic diastolic CHF (congestive heart failure) (HCC)   OSA on CPAP   Insulin dependent diabetes mellitus (HCC)   Obesity hypoventilation syndrome (HCC)   Acute renal failure superimposed on stage 3 chronic kidney disease (HCC)   Diarrhea   Dehydration   Acute-on-chronic kidney injury (HCC)   Acute on chronic diastolic HF (heart failure) (HCC)   Discharge Condition: stable  Diet recommendation: follow-up with primary care physicianIn 1 week   Filed Weights   02/14/16 0617 02/15/16 0500 02/16/16 0406  Weight: 176.812 kg (389 lb 12.8 oz) 172.82 kg (381 lb) 169.282 kg (373 lb 3.2 oz)    History of present illness:  Antonio [name] is a 65 y.o. male  has a past medical history of Blindness of right eye; MRSA infection; Anxiety; Aortic stenosis; HTN (hypertension); HLD (hyperlipidemia); Gout; Chronic diastolic heart failure (HCC); DM2 (diabetes mellitus, type 2) (HCC); OSA on CPAP; Hepatitis; Arthritis; and History of acute renal failure.  presents with 2 week history of diarrhea having at least 2-3 stools a day. Patient was seen on the day of admission as a routine appointment nephrologist and was noted to be hypotensive down to 70/50. Complained of hypoglycemia, lightheadedness.  IN ER: Initially PCC line was placed and started on aggressive fluid resuscitation blood pressure has come up to 110/80 after 5 L of normal saline he was started on broad-spectrum antibiotics with Zosyn and vancomycin for presumed sepsis of unknown  source concerning initial lactic acid 2.01 after rehydration down to 0.64  Hospital Course:  Chronic diastolic CHF (congestive heart failure) (HCC) - evidence of dehydration initially-aggressively hydrated secondary to hypotension however patient gained 25 pounds from his baseline. Cardiology was concentrated.resumed torsemide/metalozone and adjust perACHF team who know him well. Patient is back to his baseline weight.follow-up as an outpatient with the cardiology  . Acute renal failure superimposed on pre-existing cardiorenal syndrome and a setting of on stage 3 chronic kidney disease (HCC) . Kidney function worsened secondary to ATN from hypotension. Baseline1.5,2.28>1.8>1.55>1.27>>>1.18>>1.28 initially vol resuscitated, continue diuresing  . Hyperlipidemia -stable continue home medication  . Obesity hypoventilation syndrome (HCC) on oxygen at home will continue-  is scheduled for sleep study stone and this may benefit him in terms of managing his volume and elevated PA pressures   . PAF (paroxysmal atrial fibrillation) (HCC) PSVT  patient denies history of atrial fibrillation not on any anticoagulation history father clarified   . Diarrhea obtain stool studies, likely viral gastroenteritis, C. difficile negative , GI pathogen panel   . Dehydration -likely secondary to diarrhea , resolved   Diabetes mellitus continue Levemir 30 am, 35 U pm   >left knee pain: patient has history of gout. Does not have any increased redness or swelling. The patient is discharged on prednisone for 4 days   lower extremities appear to be cyanotic -2/2 to hemorrhagic bullae from volume. improving with diuresis-  -Super morbid obesity Body mass index is 53.16 kg/(m^2). We focused priorBariatric options. He has lost upto 70 lbs before He will benefit from further OP discussion-unclear how motivated  he will be in OP setting  Consultations:  cardiology  Discharge Exam: Filed Vitals:    02/16/16 1000 02/16/16 1304  BP: 123/66 109/57  Pulse: 96 96  Temp: 97.8 F (36.6 C) 97.8 F (36.6 C)  Resp:  18    General: well-nourished not in distress Cardiovascular: S1-S2 regular Respiratory: Bilateral clear to auscultation  Discharge Instructions   Discharge Instructions    Face-to-face encounter (required for Medicare/Medicaid patients)    Complete by:  As directed   I Cecilio Ohlrich certify that this patient is under my care and that I, or a nurse practitioner or physician's assistant working with me, had a face-to-face encounter that meets the physician face-to-face encounter requirements with this patient on 02/16/2016. The encounter with the patient was in whole, or in part for the following medical condition(s) which is the primary reason for home health care (List medical condition):  The encounter with the patient was in whole, or in part, for the following medical condition, which is the primary reason for home health care:  debility  I certify that, based on my findings, the following services are medically necessary home health services:   Nursing Physical therapy    Reason for Medically Necessary Home Health Services:   Therapy- Investment banker, operational, Patent examiner Skilled Nursing- Change/Decline in Patient Status Skilled Nursing- Assessment and Observation of Wound Status    My clinical findings support the need for the above services:  Unable to leave home safely without assistance and/or assistive device  Further, I certify that my clinical findings support that this patient is homebound due to:  Shortness of Breath with activity     Heart failure home health orders    Complete by:  As directed   Heart Failure Follow-up Care:  Verify follow-up appointments per Patient Discharge Instructions. Confirm transportation arranged. Reconcile home medications with discharge medication list. Remove discontinued medications from use. Assist  patient/caregiver to manage medications using pill box. Reinforce low sodium food selection Assessments: Vital signs and oxygen saturation at each visit. Assess home environment for safety concerns, caregiver support and availability of low-sodium foods. Consult Child psychotherapist, PT/OT, Dietitian, and CNA based on assessments. Perform comprehensive cardiopulmonary assessment. Notify MD for any change in condition or weight gain of 3 pounds in one day or 5 pounds in one week with symptoms. Daily Weights and Symptom Monitoring: Ensure patient has access to scales. Teach patient/caregiver to weigh daily before breakfast and after voiding using same scale and record.    Teach patient/caregiver to track weight and symptoms and when to notify Provider. Activity: Develop individualized activity plan with patient/caregiver.  Heart Failure Follow-up Care:  [facility] ([facility]) [facility] at [phone]  Obtain the following labs:  Basic Metabolic Panel  Lab frequency:  Weekly  Fax lab results to:  [facility] Clinic at [phone]  Diet:  Low Sodium Heart Healthy  Fluid restrictions:  1500 mL Fluid          Current Discharge Medication List    START taking these medications   Details  predniSONE (DELTASONE) 20 MG tablet Take 2 tablets (40 mg total) by mouth daily with breakfast. Qty: 4 tablet, Refills: 0      CONTINUE these medications which have NOT CHANGED   Details  aspirin 81 MG tablet Take 81 mg by mouth daily.    atorvastatin (LIPITOR) 40 MG tablet Take 40 mg by mouth daily at 6 PM.     colchicine 0.6 MG tablet Take 0.6  mg by mouth daily.     febuxostat (ULORIC) 40 MG tablet Take 40 mg by mouth daily.     fenofibrate 54 MG tablet Take 54 mg by mouth daily. Refills: 0    fluticasone (FLONASE) 50 MCG/ACT nasal spray Place 1 spray into both nostrils daily. Qty: 16 g, Refills: 2    hydrocerin (EUCERIN) CREA Apply 1 application topically 2 (two) times daily. Qty: 113 g, Refills: 0     HYDROcodone-acetaminophen (NORCO) 7.5-325 MG tablet Take 1 tablet by mouth every 4 (four) hours as needed for moderate pain. Qty: 10 tablet, Refills: 0    hydrOXYzine (ATARAX/VISTARIL) 25 MG tablet Take 25 mg by mouth every 6 (six) hours as needed for itching.    insulin detemir (LEVEMIR) 100 UNIT/ML injection 25units qam and 30 units qhs Qty: 10 mL, Refills: 11    lidocaine (LINDAMANTLE) 3 % CREA cream Apply 1 application topically 3 (three) times daily as needed (rash).    NOVOLOG 100 UNIT/ML injection Inject 30-40 Units into the skin 3 (three) times daily with meals. Sliding scale Refills: 0    OXYGEN Inhale 4 L into the lungs at bedtime.    potassium chloride SA (K-DUR,KLOR-CON) 20 MEQ tablet Take 2 tablets (40 mEq total) by mouth daily. Take 3 tabs in AM and 2 tabs in PM Qty: 130 tablet, Refills: 6    sodium chloride (OCEAN) 0.65 % SOLN nasal spray Place 1 spray into both nostrils as needed for congestion. Qty: 1 Bottle, Refills: 0    torsemide (DEMADEX) 20 MG tablet Take 4 tablets (80 mg total) by mouth 2 (two) times daily. Qty: 240 tablet, Refills: 0       Allergies  Allergen Reactions  . Other Other (See Comments)    Salt causes "extreme" edema.   Follow-up Information    Follow up with [facility] On 02/28/2016.   Specialty:  Cardiology   Why:  at 1030 for post hospital follow up.  Please bring all of your medications. The code for parking is [phone].   Contact information:   [address] [id number] mc [place] [address] [phone]       The results of significant diagnostics from this hospitalization (including imaging, microbiology, ancillary and laboratory) are listed below for reference.    Significant Diagnostic Studies: Dg Chest Portable 1 View  02/08/2016  CLINICAL DATA:  Low blood pressure on routine office visit. History of renal failure. EXAM: PORTABLE CHEST 1 VIEW COMPARISON:   01/02/2016. FINDINGS: Low lung volumes. Cardiomegaly. Prior CABG. Bibasilar atelectasis without focal infiltrates or overt failure. Mild vascular congestion. No pneumothorax. Small BILATERAL effusions. IMPRESSION: Low lung volumes with cardiomegaly, bibasilar atelectasis, and mild vascular congestion. No overt failure. Similar appearance to priors. Electronically Signed   By: Elsie Stain M.D.   On: 02/08/2016 15:06    Microbiology: Recent Results (from the past 240 hour(s))  Blood Culture (routine x 2)     Status: None   Collection Time: 02/08/16  2:57 PM  Result Value Ref Range Status   Specimen Description BLOOD RIGHT ANTECUBITAL  Final   Special Requests BOTTLES DRAWN AEROBIC AND ANAEROBIC 5CC  Final   Culture NO GROWTH 5 DAYS  Final   Report Status 02/13/2016 FINAL  Final  Blood Culture (routine x 2)     Status: None   Collection Time: 02/08/16  3:02 PM  Result Value Ref Range Status   Specimen Description BLOOD LEFT ANTECUBITAL  Final  Special Requests BOTTLES DRAWN AEROBIC AND ANAEROBIC 5CC  Final   Culture NO GROWTH 5 DAYS  Final   Report Status 02/13/2016 FINAL  Final  Urine culture     Status: None   Collection Time: 02/08/16  3:14 PM  Result Value Ref Range Status   Specimen Description URINE, CLEAN CATCH  Final   Special Requests NONE  Final   Culture MULTIPLE SPECIES PRESENT, SUGGEST RECOLLECTION  Final   Report Status 02/10/2016 FINAL  Final  MRSA PCR Screening     Status: None   Collection Time: 02/09/16  1:45 AM  Result Value Ref Range Status   MRSA by PCR NEGATIVE NEGATIVE Final    Comment:        The GeneXpert MRSA Assay (FDA approved for NASAL specimens only), is one component of a comprehensive MRSA colonization surveillance program. It is not intended to diagnose MRSA infection nor to guide or monitor treatment for MRSA infections.   C difficile quick scan w PCR reflex     Status: None   Collection Time: 02/09/16  8:14 AM  Result Value Ref Range  Status   C Diff antigen NEGATIVE NEGATIVE Final   C Diff toxin NEGATIVE NEGATIVE Final   C Diff interpretation Negative for toxigenic C. difficile  Final     Labs: Basic Metabolic Panel:  Recent Labs Lab 02/12/16 0254 02/13/16 0800 02/14/16 0435 02/15/16 0307 02/16/16 0219  NA 140 141 140 140 139  K 3.7 3.4* 3.7 3.6 3.6  CL 96* 95* 94* 88* 87*  CO2 33* 37* 35* 39* 37*  GLUCOSE 187* 125* 222* 185* 225*  BUN 28* 27* 27* 29* 34*  CREATININE 1.55* 1.27* 1.18 1.28* 1.38*  CALCIUM 8.7* 8.8* 8.9 9.3 9.2  MG  --   --  2.0  --   --    Liver Function Tests:  Recent Labs Lab 02/10/16 0515 02/12/16 0254  AST 15 16  ALT 15* 15*  ALKPHOS 62 57  BILITOT 0.7 0.5  PROT 6.4* 6.1*  ALBUMIN 3.3* 3.1*   No results for input(s): LIPASE, AMYLASE in the last 168 hours. No results for input(s): AMMONIA in the last 168 hours. CBC:  Recent Labs Lab 02/10/16 0515 02/11/16 0218 02/16/16 0219  WBC 8.7 9.0 9.2  HGB 11.8* 11.8* 12.4*  HCT 39.4 37.1* 40.4  MCV 87.6 86.7 86.3  PLT 258 261 248   Cardiac Enzymes: No results for input(s): CKTOTAL, CKMB, CKMBINDEX, TROPONINI in the last 168 hours. BNP: BNP (last 3 results)  Recent Labs  01/01/16 1108 01/02/16 0652 02/08/16 1513  BNP 196.0* 173.7* 29.9    ProBNP (last 3 results) No results for input(s): PROBNP in the last 8760 hours.  CBG:  Recent Labs Lab 02/15/16 2030 02/15/16 2331 02/16/16 0403 02/16/16 0802 02/16/16 1109  GLUCAP 287* 241* 246* 184* 368*       Signed:  Jude Naclerio MD.  [facility] 02/16/2016, 2:43 PM

## 2016-02-16 NOTE — Care Management Important Message (Signed)
Important Message  Patient Details  Name: Antonio [name] MRN: [id number] Date of Birth: 10-Dec-1950   Medicare Important Message Given:  Yes    Terrisa Curfman P Elnita Surprenant 02/16/2016, 4:11 PM

## 2016-02-16 NOTE — Progress Notes (Signed)
Patient discharged with PTAR> A % O x4.  VSS.  No further needs at this time. A.Tyann Niehaus, RN

## 2016-02-16 NOTE — Clinical Social Work Note (Signed)
Clinical Social Worker received call from [facility] for possible SNF placement. Patient willing to discharge to SNF if Medicare days have restarted. CSW has reviewed chart and noted that patient was at [facility] for 100 days and [facility] from 3/13-3/17 which CSW has verified with facility.   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 Per RNCM, patient is not willing to pay private for SNF placement.   Patient also has not had a 60 day wellness period, which he will need before Medicare SNF benefits restart for potential full coverage.  RNCM to notify patient of findings. Patient agreeable to returning home with home health services. PT also recommending Home Health.  No further concerns reported at this time.   Clinical Social Worker will sign off for now as social work intervention is no longer needed. Please consult [place] again if new need arises.  Derenda Fennel, MSW, LCSWA [phone] 02/16/2016 3:24 PM

## 2016-02-16 NOTE — Progress Notes (Signed)
Advanced Heart Failure Rounding Note  PCP: Dr Leonette Most [facility]) HF Cardiology: Dr. Shirlee Latch  Subjective:    Admitted 02/08/16 with 2 week history of diarrhea and hypotension after presenting to PCP appointment.  Gained 20 lbs in hospital due to fluid resuscitation.   Excited to be back at previous weight.  Now having bad gout pain in left knee from aggressive diuresis. Requests steroids, colchicine is not very effective for him he states.  Denies SOB, CP, lightheadedness, or dizziness.  Says he is having trouble arranging a ride and may need ambulance transport home.   He was out 2.1 L and down another 8 lbs overnight. Down 23 lbs from highest weight this admission. Back at baseline weight PTA. Creatinine slightly up but near previous baseline with metolazone yesterday.    Objective:   Weight Range: 373 lb 3.2 oz (169.282 kg) Body mass index is 52.07 kg/(m^2).   Vital Signs:   Temp:  [97.8 F (36.6 C)-98.4 F (36.9 C)] 97.8 F (36.6 C) (04/20 0722) Pulse Rate:  [84-97] 84 (04/20 0722) Resp:  [18-20] 18 (04/20 0722) BP: (97-121)/(65-75) 97/75 mmHg (04/20 0722) SpO2:  [92 %-95 %] 95 % (04/20 0722) Weight:  [373 lb 3.2 oz (244.975 kg)] 373 lb 3.2 oz (169.282 kg) (04/20 0406) Last BM Date: 02/15/16  Weight change: Filed Weights   02/14/16 0617 02/15/16 0500 02/16/16 0406  Weight: 389 lb 12.8 oz (176.812 kg) 381 lb (172.82 kg) 373 lb 3.2 oz (169.282 kg)    Intake/Output:   Intake/Output Summary (Last 24 hours) at 02/16/16 0801 Last data filed at 02/16/16 0424  Gross per 24 hour  Intake   1300 ml  Output   3500 ml  Net  -2200 ml     Physical Exam: General: Morbidly obese. Seated in recliner eating breakfast. Neck: Thick, JVP difficult to assess with patient girth. no thyromegaly or thyroid nodule.  Lungs: CTAB, normal effort. CV: Nondisplaced PMI. Heart regular S1/S2, no S3/S4, 1/6 SEM RUSB. No carotid bruit. Normal pedal pulses.  Abdomen: Morbidly obese, soft,  non-tender, non-distended, no HSM. No bruits or masses. +BS  Skin: Intact without lesions or rashes.  Neurologic: Alert and oriented x 3.  Psych: Normal affect. Extremities: No clubbing or cyanosis. Trace-1+ ankle edema RLE with significant venous stasis changes.  HEENT: Normal.   Telemetry: Not on tele.   Labs: CBC  Recent Labs  02/16/16 0219  WBC 9.2  HGB 12.4*  HCT 40.4  MCV 86.3  PLT 248   Basic Metabolic Panel  Recent Labs  02/14/16 0435 02/15/16 0307 02/16/16 0219  NA 140 140 139  K 3.7 3.6 3.6  CL 94* 88* 87*  CO2 35* 39* 37*  GLUCOSE 222* 185* 225*  BUN 27* 29* 34*  CREATININE 1.18 1.28* 1.38*  CALCIUM 8.9 9.3 9.2  MG 2.0  --   --    Liver Function Tests No results for input(s): AST, ALT, ALKPHOS, BILITOT, PROT, ALBUMIN in the last 72 hours. No results for input(s): LIPASE, AMYLASE in the last 72 hours. Cardiac Enzymes No results for input(s): CKTOTAL, CKMB, CKMBINDEX, TROPONINI in the last 72 hours.  BNP: BNP (last 3 results)  Recent Labs  01/01/16 1108 01/02/16 0652 02/08/16 1513  BNP 196.0* 173.7* 29.9    ProBNP (last 3 results) No results for input(s): PROBNP in the last 8760 hours.   D-Dimer No results for input(s): DDIMER in the last 72 hours. Hemoglobin A1C No results for input(s): HGBA1C in the  last 72 hours. Fasting Lipid Panel No results for input(s): CHOL, HDL, LDLCALC, TRIG, CHOLHDL, LDLDIRECT in the last 72 hours. Thyroid Function Tests No results for input(s): TSH, T4TOTAL, T3FREE, THYROIDAB in the last 72 hours.  Invalid input(s): FREET3  Other results:     Imaging/Studies:  No results found.  Latest Echo  Latest Cath   Medications:     Scheduled Medications: . aspirin EC  81 mg Oral Daily  . atorvastatin  40 mg Oral q1800  . colchicine  0.6 mg Oral Daily  . enoxaparin (LOVENOX) injection  90 mg Subcutaneous Q24H  . febuxostat  40 mg Oral Daily  . fenofibrate  54 mg Oral Daily  . insulin aspart   0-15 Units Subcutaneous 6 times per day  . insulin detemir  30 Units Subcutaneous Daily  . insulin detemir  35 Units Subcutaneous QHS  . potassium chloride  40 mEq Oral BID  . sodium chloride flush  3 mL Intravenous Q12H  . torsemide  80 mg Oral BID  . zolpidem  5 mg Oral QHS    Infusions:    PRN Medications: acetaminophen **OR** acetaminophen, albuterol, HYDROcodone-acetaminophen, ondansetron **OR** ondansetron (ZOFRAN) IV, oxyCODONE, polyethylene glycol   Assessment/Plan   Admitted 02/08/16 with presumed dehydration from diarrhea x 2 weeks.  Treated with IVF and now volume overloaded. HF team consulted for medication adjustments.   1. Acute on chronic diastolic CHF - He diuresed well and is back at previous baseline with metolazone yesterday.  -  Continue torsemide 80 mg BID and K 40 meq BID for home. - K 3.6. Will give extra 40 meq this am and recheck labs at follow up.  2. AKI on CKD Stage III - Continues to improve. - Continue to follow closely with diuresis.  3. Bioprosthetic aortic valve - Stable on most recent echo 4. Morbid Obesity - Needs to limit portion sizes and increase activity.  - Has been seen by PT/OT. HHPT/OT recommended. To continue HH with [facility]. 5. OSA - Continue CPAP nightly 6. DM - Per primary team.  7. HTN - He wishes to remain off of ACE/ARB for now with hypotension coming in.  Can try adding back at low dose, or we can address at his follow up.  8. Gout - He requests steroids. He states colchicine is not very effecting in aborting a gout flare for him. - We typically use prednisone burst of 40 mg daily x 3 days. Will prescribe. Further per primary team.   OK for home today. Has HF clinic follow up on  02/28/16.  Home HF Meds Torsemide 80 mg BID Potassium 40 meq BID Metolazone 2.5 mg AS NEEDED for weight gains of 3 lbs overnight or 5 lbs within one week.   Length of Stay: 7   [name] PA-C 02/16/2016, 8:01 AM  Advanced Heart  Failure Team Pager [phone] (M-F; 7a - 4p)  Please contact [facility] for night-coverage after hours (4p -7a ) and weekends on [email or web address]  Patient seen with PA, agree with note.  Volume much better now, ok for home with the above meds.   [name] 02/16/2016 1:34 PM

## 2016-02-16 NOTE — Care Management Note (Addendum)
Case Management Note  Patient Details  Name: Antonio [name] MRN: [id number] Date of Birth: Nov 23, 1950  Subjective/Objective:      CHF              Action/Plan: Discharge Planning:  Chart reviewed. NCM spoke to pt at bedside. States he is active with [facility] for [facility] PT, OT and RN. He has oxygen, CPAP and RW at home. Lives alone, He has two ex-wives that will assist as needed. Will continue to follow and send resumption of care orders to [facility] when they become available.    Oren Bracket MD  Expected Discharge Date:                  Expected Discharge Plan:  Home w Home Health Services  In-House Referral:  NA  Discharge planning Services  CM Consult  Post Acute Care Choice:  Home Health Choice offered to:  Patient  DME Arranged:  N/A DME Agency:  NA  HH Arranged:  RN HH Agency:     Status of Service:  In process, will continue to follow  Medicare Important Message Given:  Yes Date Medicare IM Given:    Medicare IM give by:    Date Additional Medicare IM Given:    Additional Medicare Important Message give by:     If discussed at Long Length of Stay Meetings, dates discussed:    Additional Comments: CSW informed CM that pt would have to private pay for SNF - pt declined due to hardship.  Pt states he understood and would go home with [facility].  [facility] contacted and informed of discharge today.  CM contacted [facility] and arranged pick up for 6 pm to pts home in [place], informed service that pt will require transport oxygen - pt confirmed he has supply of home oxygen - PTA on 4 liters at discharge remains on 4 liters. , CM confirmed address with pt.  [facility] informed that pt will discharge home today.  CM provided bedside nurse transport form and informed her of pick up time.    CM requested by pt - pt states he wants to discharge to SNF due to being unable to walk at this time due to gout, however pt recently discharged from SNF late Feb and has been told he is out of bed days - pt  stated he could not afford private pay .  CM consulted with PT - PT feels that pt will be appropriate for SNF due to recent change in mobility.  CM consulted CSW and requested quick review to see if pt has available bed days, case manager will continue to follow.  Pt has been added to [facility] program, program explained to pt in detail - pt stated he would be compliant with program and liaisons.  CM recevied call from [facility] from [facility].  Antonieta Pert states that are in agreement to transport pt home and assist with getting up stairs to enter home - however pt has contacted agency and informed them that due to his gout flare up he is unable to climb stairs.  Agency is familiar with pt and recalled previous instances where pt was immobile for a month due to gout - therefore they will be unable to assist pt at this current time.  CM texted paged MD to inform of recent events and to gain updated discharge plan due to new onset.  MD aware of pt c/o - plan remains for pt to discharge home - CM will  arrange [facility] transport.    Pt states he already has tub bench Cherylann Parr, RN 02/16/2016, 12:15 PM

## 2016-02-22 ENCOUNTER — Inpatient Hospital Stay (HOSPITAL_COMMUNITY)
Admission: EM | Admit: 2016-02-22 | Discharge: 2016-02-27 | DRG: 683 | Disposition: A | Discharge status: Home Health | Payer: Medicare Other | Attending: Internal Medicine | Admitting: Internal Medicine

## 2016-02-22 DIAGNOSIS — Z9981 Dependence on supplemental oxygen: Secondary | ICD-10-CM

## 2016-02-22 DIAGNOSIS — E119 Type 2 diabetes mellitus without complications: Secondary | ICD-10-CM

## 2016-02-22 DIAGNOSIS — Z953 Presence of xenogenic heart valve: Secondary | ICD-10-CM

## 2016-02-22 DIAGNOSIS — T502X5A Adverse effect of carbonic-anhydrase inhibitors, benzothiadiazides and other diuretics, initial encounter: Secondary | ICD-10-CM | POA: Diagnosis present

## 2016-02-22 DIAGNOSIS — F419 Anxiety disorder, unspecified: Secondary | ICD-10-CM | POA: Diagnosis present

## 2016-02-22 DIAGNOSIS — Z6841 Body Mass Index (BMI) 40.0 and over, adult: Secondary | ICD-10-CM

## 2016-02-22 DIAGNOSIS — N183 Chronic kidney disease, stage 3 unspecified: Secondary | ICD-10-CM | POA: Diagnosis present

## 2016-02-22 DIAGNOSIS — IMO0001 Reserved for inherently not codable concepts without codable children: Secondary | ICD-10-CM

## 2016-02-22 DIAGNOSIS — N179 Acute kidney failure, unspecified: Secondary | ICD-10-CM

## 2016-02-22 DIAGNOSIS — Z794 Long term (current) use of insulin: Secondary | ICD-10-CM

## 2016-02-22 DIAGNOSIS — Z8249 Family history of ischemic heart disease and other diseases of the circulatory system: Secondary | ICD-10-CM

## 2016-02-22 DIAGNOSIS — I959 Hypotension, unspecified: Secondary | ICD-10-CM

## 2016-02-22 DIAGNOSIS — E1165 Type 2 diabetes mellitus with hyperglycemia: Secondary | ICD-10-CM | POA: Diagnosis present

## 2016-02-22 DIAGNOSIS — E860 Dehydration: Secondary | ICD-10-CM | POA: Diagnosis not present

## 2016-02-22 DIAGNOSIS — J9610 Chronic respiratory failure, unspecified whether with hypoxia or hypercapnia: Secondary | ICD-10-CM | POA: Diagnosis present

## 2016-02-22 DIAGNOSIS — I130 Hypertensive heart and chronic kidney disease with heart failure and stage 1 through stage 4 chronic kidney disease, or unspecified chronic kidney disease: Secondary | ICD-10-CM | POA: Diagnosis present

## 2016-02-22 DIAGNOSIS — E8770 Fluid overload, unspecified: Secondary | ICD-10-CM

## 2016-02-22 DIAGNOSIS — E785 Hyperlipidemia, unspecified: Secondary | ICD-10-CM | POA: Diagnosis present

## 2016-02-22 DIAGNOSIS — Z8614 Personal history of Methicillin resistant Staphylococcus aureus infection: Secondary | ICD-10-CM

## 2016-02-22 DIAGNOSIS — R112 Nausea with vomiting, unspecified: Secondary | ICD-10-CM | POA: Diagnosis present

## 2016-02-22 DIAGNOSIS — Z7982 Long term (current) use of aspirin: Secondary | ICD-10-CM

## 2016-02-22 DIAGNOSIS — Z79899 Other long term (current) drug therapy: Secondary | ICD-10-CM

## 2016-02-22 DIAGNOSIS — E871 Hypo-osmolality and hyponatremia: Secondary | ICD-10-CM | POA: Diagnosis present

## 2016-02-22 DIAGNOSIS — I5033 Acute on chronic diastolic (congestive) heart failure: Secondary | ICD-10-CM | POA: Diagnosis present

## 2016-02-22 DIAGNOSIS — E1122 Type 2 diabetes mellitus with diabetic chronic kidney disease: Secondary | ICD-10-CM | POA: Diagnosis present

## 2016-02-22 DIAGNOSIS — G4733 Obstructive sleep apnea (adult) (pediatric): Secondary | ICD-10-CM | POA: Diagnosis present

## 2016-02-22 DIAGNOSIS — H5441 Blindness, right eye, normal vision left eye: Secondary | ICD-10-CM | POA: Diagnosis present

## 2016-02-22 DIAGNOSIS — I5032 Chronic diastolic (congestive) heart failure: Secondary | ICD-10-CM | POA: Diagnosis present

## 2016-02-22 DIAGNOSIS — Z9989 Dependence on other enabling machines and devices: Secondary | ICD-10-CM

## 2016-02-22 DIAGNOSIS — M109 Gout, unspecified: Secondary | ICD-10-CM | POA: Diagnosis present

## 2016-02-22 NOTE — ED Provider Notes (Signed)
CSN: [id number]     Arrival date & time 02/22/16  2348 History   By signing my name below, I, Antonio [name], attest that this documentation has been prepared under the direction and in the presence of Antonio Crease, MD.  Electronically Signed: Arlan [name], ED Scribe. 02/22/2016. 12:33 AM.   Chief Complaint  Patient presents with  . Weakness  . Emesis   The history is provided by the patient. No language interpreter was used.    HPI Comments: Antonio [name], brought in by EMS is a 65 y.o. male with a PMHx of HTN, HLD, and DM who presents to the Emergency Department complaining of constant, ongoing generalized weakness onset 1 hour prior to arrival. Pt also reports ongoing dizziness along with nausea and vomiting. No aggravating or alleviaitng factors at this time. 4 mg of Zofran and 250 mg of fluids given en route to department with mild improvement. Blood sugar prior to arrival 127 per EMS. No recent fever or chills. Antonio [name] was recently admitted to the hospital on 02/08/16 for dehydration and hypotension. At time of discharge on 02/16/16, 1 week follow up with CHF clinic and PCP recommended.  PCP: Antonio Falcon, MD    Past Medical History  Diagnosis Date  . Blindness of right eye     a. due to amblyopia as child  . Hx MRSA infection     a. thigh abcess 2006  . Anxiety   . Aortic stenosis     a. s/p AVR 11/29/11  . HTN (hypertension)   . HLD (hyperlipidemia)   . Gout   . Chronic diastolic heart failure (HCC)     a. Echo 08/11/2015 LVEF 55-60% with normal RV function  . DM2 (diabetes mellitus, type 2) (HCC)   . OSA on CPAP   . Hepatitis   . Arthritis   . History of acute renal failure    Past Surgical History  Procedure Laterality Date  . Lumbar laminectomy  10/30/1983  . Aortic valve replacement  11/29/2011    Procedure: AORTIC VALVE REPLACEMENT (AVR);  Surgeon: [name], MD;  [facility];  Service: Open Heart Surgery;  Laterality: N/A;  with nitric  oxide  . Right heart catheterization N/A 11/28/2011    Procedure: RIGHT HEART CATH;  Surgeon: [name], MD;  Location: [facility] CATH LAB;  Service: Cardiovascular;  Laterality: N/A;  . Cardiac valve replacement    . Incision and drainage abscess Left 08/2005    "upper/inner thigh; that's where the MRSA was"  . Back surgery    . Cardiac catheterization    . Cardiac catheterization N/A 10/14/2015    Procedure: Right Heart Cath;  Surgeon: [name], MD;  Location: [facility] INVASIVE CV LAB;  Service: Cardiovascular;  Laterality: N/A;   Family History  Problem Relation Age of Onset  . Heart attack Father   . Cancer Mother    Social History  Substance Use Topics  . Smoking status: Never Smoker   . Smokeless tobacco: Never Used  . Alcohol Use: Yes     Comment: 02/18/2015 "once or twice/yr I'll have a cocktail"      rare    Review of Systems  Constitutional: Negative for fever and chills.  Respiratory: Negative for cough and shortness of breath.   Cardiovascular: Negative for chest pain.  Gastrointestinal: Positive for nausea and vomiting. Negative for abdominal pain.  Neurological: Positive for weakness.  Psychiatric/Behavioral: Negative for confusion.  All other  systems reviewed and are negative.     Allergies  Other  Home Medications   Prior to Admission medications   Medication Sig Start Date End Date Taking? Authorizing Provider  aspirin 81 MG tablet Take 81 mg by mouth daily.   Yes Historical Provider, MD  atorvastatin (LIPITOR) 40 MG tablet Take 40 mg by mouth daily at 6 PM.  12/05/14  Yes Historical Provider, MD  colchicine 0.6 MG tablet Take 0.6 mg by mouth daily.  09/01/14  Yes Historical Provider, MD  febuxostat (ULORIC) 40 MG tablet Take 40 mg by mouth daily.    Yes Historical Provider, MD  fenofibrate 54 MG tablet Take 54 mg by mouth daily. 05/04/15  Yes Historical Provider, MD  fluticasone (FLONASE) 50 MCG/ACT nasal spray Place 1 spray into both nostrils daily.  01/09/16  Yes [name], MD  hydrocerin (EUCERIN) CREA Apply 1 application topically 2 (two) times daily. 09/19/15  Yes [name], PA-C  HYDROcodone-acetaminophen (NORCO) 7.5-325 MG tablet Take 1 tablet by mouth every 4 (four) hours as needed for moderate pain. 01/09/16  Yes [name], MD  hydrOXYzine (ATARAX/VISTARIL) 25 MG tablet Take 25 mg by mouth every 6 (six) hours as needed for itching.   Yes Historical Provider, MD  insulin detemir (LEVEMIR) 100 UNIT/ML injection 25units qam and 30 units qhs Patient taking differently: Inject 30-35 Units into the skin 2 (two) times daily. 30units qam and 35 units qhs 01/09/16  Yes [name], MD  lidocaine (LINDAMANTLE) 3 % CREA cream Apply 1 application topically 3 (three) times daily as needed (rash).   Yes Historical Provider, MD  NOVOLOG 100 UNIT/ML injection Inject 30-40 Units into the skin 3 (three) times daily with meals. Sliding scale 05/12/15  Yes Historical Provider, MD  OXYGEN Inhale 4 L into the lungs at bedtime.   Yes Historical Provider, MD  potassium chloride SA (K-DUR,KLOR-CON) 20 MEQ tablet Take 2 tablets (40 mEq total) by mouth daily. Take 3 tabs in AM and 2 tabs in PM Patient taking differently: Take 40 mEq by mouth 2 (two) times daily.  01/09/16  Yes [name], MD  sodium chloride (OCEAN) 0.65 % SOLN nasal spray Place 1 spray into both nostrils as needed for congestion. 01/09/16  Yes [name], MD  torsemide (DEMADEX) 20 MG tablet Take 4 tablets (80 mg total) by mouth 2 (two) times daily. 01/09/16  Yes [name], MD   Triage Vitals: BP 68/35 mmHg  Pulse 84  Temp(Src) 97.6 F (36.4 C) (Oral)  Resp 22  Ht  (1.803 m)  Wt 368 lb (166.924 kg)  BMI 51.35 kg/m2  SpO2 97%   Physical Exam  Constitutional: He is oriented to person, place, and time. He appears well-developed and well-nourished. No distress.  Pt is covered in vomit   HENT:  Head: Normocephalic and atraumatic.  Right Ear: Hearing normal.  Left Ear: Hearing normal.  Nose:  Nose normal.  Mouth/Throat: Oropharynx is clear and moist and mucous membranes are normal.  Eyes: Conjunctivae and EOM are normal. Pupils are equal, round, and reactive to light.  Neck: Normal range of motion. Neck supple.  Cardiovascular: Regular rhythm, S1 normal and S2 normal.  Exam reveals no gallop and no friction rub.   No murmur heard. Pulmonary/Chest: Effort normal and breath sounds normal. No respiratory distress. He exhibits no tenderness.  Abdominal: Soft. Normal appearance and bowel sounds are normal. There is no hepatosplenomegaly. There is no tenderness. There is no rebound, no guarding, no  tenderness at McBurney's point and negative Murphy's sign. No hernia.  Musculoskeletal: Normal range of motion.  Neurological: He is alert and oriented to person, place, and time. He has normal strength. No cranial nerve deficit or sensory deficit. Coordination normal. GCS eye subscore is 4. GCS verbal subscore is 5. GCS motor subscore is 6.  Skin: Skin is warm, dry and intact. No rash noted. No cyanosis.  Psychiatric: He has a normal mood and affect. His speech is normal and behavior is normal. Thought content normal.  Nursing note and vitals reviewed.   ED Course  Procedures (including critical care time)  DIAGNOSTIC STUDIES: Oxygen Saturation is 93% on RA, adequate by my interpretation.    COORDINATION OF CARE: 11:54 PM- Will order imaging, blood work, urinalysis, and EKG. Discussed treatment plan with pt at bedside and pt agreed to plan.     Labs Review Labs Reviewed  CBC WITH DIFFERENTIAL/PLATELET - Abnormal; Notable for the following:    WBC 17.2 (*)    Neutro Abs 11.5 (*)    Lymphs Abs 4.3 (*)    All other components within normal limits  COMPREHENSIVE METABOLIC PANEL - Abnormal; Notable for the following:    Potassium 3.3 (*)    Chloride 80 (*)    CO2 43 (*)    Glucose, Bld 132 (*)    BUN 85 (*)    Creatinine, Ser 2.33 (*)    ALT 15 (*)    GFR calc non Af Amer 28 (*)     GFR calc Af Amer 32 (*)    All other components within normal limits  TROPONIN I - Abnormal; Notable for the following:    Troponin I 0.04 (*)    All other components within normal limits  I-STAT CG4 LACTIC ACID, ED - Abnormal; Notable for the following:    Lactic Acid, Venous 2.56 (*)    All other components within normal limits  LIPASE, BLOOD  URINALYSIS, ROUTINE W REFLEX MICROSCOPIC (NOT AT [facility])    Imaging Review Dg Abd Acute W/chest  02/23/2016  CLINICAL DATA:  Vomiting EXAM: DG ABDOMEN ACUTE W/ 1V CHEST COMPARISON:  02/08/2016 FINDINGS: Nonobstructive bowel gas pattern. No evidence of pneumoperitoneum or pneumatosis. Low volume chest with streaky basilar opacities attributed atelectasis. No edema, effusion, or pneumothorax. Chronic cardiomegaly. Status post aortic valve replacement. IMPRESSION: 1. Normal bowel gas pattern. 2. Low volume chest with basilar atelectasis. Electronically Signed   By: Marnee Spring M.D.   On: 02/23/2016 00:51   I have personally reviewed and evaluated these images and lab results as part of my medical decision-making.   EKG Interpretation   Date/Time:  Wednesday February 22 2016 23:59:58 EDT Ventricular Rate:  87 PR Interval:  195 QRS Duration: 163 QT Interval:  414 QTC Calculation: 498 R Axis:   -56 Text Interpretation:  Sinus rhythm Multiform ventricular premature  complexes Right bundle branch block No significant change since last  tracing Confirmed by Briggs Edelen  MD, Bertina Guthridge [phone]) on 02/23/2016  12:31:13 AM      MDM   Final diagnoses:  None  hypotension AKI dehydration  Patient presents to the emergency department for evaluation of weakness, dizziness, nausea and vomiting. Patient has a history of congestive heart failure. He was recently hospitalized for dehydration, but after being administered IV fluids during the hospital stay, became file overloaded and had a prolonged stay for diuresis. He is back on his torsemide and  Zaroxolyn.  Patient became acutely ill today with nausea and vomiting.  He denies abdominal pain. He does not have a fever. Patient is noted to be hypotensive at arrival. BUN and creatinine are significantly elevated from his baseline. This appears to be consistent with significant dehydration. Patient's blood pressure did improve with a liter fluid bolus, but after the bolus was discontinued his blood pressures dropped again. He was initiated on additional IV fluids. He will require careful monitoring of his volume status, however, because with previous similar presentation he became volume overloaded. He is tolerating the pressures well, mentating normally. As his labs support diagnosis of hypovolemia, I do not believe he requires pressors.  Does have an elevated white blood cell count, no obvious source of infection is identified. Urinalysis is pending, he has not made any urine yet with his dehydration. Will attempt in and out catheterization. Will ask hospitalist to admit the patient for hypotension secondary to dehydration.  CRITICAL CARE Performed by: Antonio [name]   Total critical care time: 30 minutes  Critical care time was exclusive of separately billable procedures and treating other patients.  Critical care was necessary to treat or prevent imminent or life-threatening deterioration.  Critical care was time spent personally by me on the following activities: development of treatment plan with patient and/or surrogate as well as nursing, discussions with consultants, evaluation of patient's response to treatment, examination of patient, obtaining history from patient or surrogate, ordering and performing treatments and interventions, ordering and review of laboratory studies, ordering and review of radiographic studies, pulse oximetry and re-evaluation of patient's condition.   I personally performed the services described in this documentation, which was scribed in my  presence. The recorded information has been reviewed and is accurate.    Antonio Crease, MD 02/23/16 [phone]

## 2016-02-22 NOTE — ED Notes (Signed)
Pt to ED from home c/o generalized weakness, NV onset about an hour ago. Recently admitted for dehydration and hypotension. EMS gave 4mg  zofran and NS enroute. Initial bp 82 palpated, no improvement with fluids

## 2016-02-23 ENCOUNTER — Emergency Department (HOSPITAL_COMMUNITY): Payer: Medicare Other

## 2016-02-23 ENCOUNTER — Encounter (HOSPITAL_COMMUNITY): Payer: Self-pay | Admitting: *Deleted

## 2016-02-23 ENCOUNTER — Inpatient Hospital Stay (HOSPITAL_COMMUNITY): Payer: Medicare Other

## 2016-02-23 DIAGNOSIS — I130 Hypertensive heart and chronic kidney disease with heart failure and stage 1 through stage 4 chronic kidney disease, or unspecified chronic kidney disease: Secondary | ICD-10-CM | POA: Diagnosis present

## 2016-02-23 DIAGNOSIS — I5033 Acute on chronic diastolic (congestive) heart failure: Secondary | ICD-10-CM | POA: Diagnosis not present

## 2016-02-23 DIAGNOSIS — H5441 Blindness, right eye, normal vision left eye: Secondary | ICD-10-CM | POA: Diagnosis present

## 2016-02-23 DIAGNOSIS — N179 Acute kidney failure, unspecified: Secondary | ICD-10-CM | POA: Diagnosis present

## 2016-02-23 DIAGNOSIS — T502X5A Adverse effect of carbonic-anhydrase inhibitors, benzothiadiazides and other diuretics, initial encounter: Secondary | ICD-10-CM | POA: Diagnosis present

## 2016-02-23 DIAGNOSIS — I5032 Chronic diastolic (congestive) heart failure: Secondary | ICD-10-CM | POA: Diagnosis present

## 2016-02-23 DIAGNOSIS — Z6841 Body Mass Index (BMI) 40.0 and over, adult: Secondary | ICD-10-CM | POA: Diagnosis not present

## 2016-02-23 DIAGNOSIS — E1165 Type 2 diabetes mellitus with hyperglycemia: Secondary | ICD-10-CM | POA: Diagnosis present

## 2016-02-23 DIAGNOSIS — Z8249 Family history of ischemic heart disease and other diseases of the circulatory system: Secondary | ICD-10-CM | POA: Diagnosis not present

## 2016-02-23 DIAGNOSIS — F419 Anxiety disorder, unspecified: Secondary | ICD-10-CM | POA: Diagnosis present

## 2016-02-23 DIAGNOSIS — I959 Hypotension, unspecified: Secondary | ICD-10-CM | POA: Diagnosis present

## 2016-02-23 DIAGNOSIS — Z8614 Personal history of Methicillin resistant Staphylococcus aureus infection: Secondary | ICD-10-CM | POA: Diagnosis not present

## 2016-02-23 DIAGNOSIS — Z794 Long term (current) use of insulin: Secondary | ICD-10-CM | POA: Diagnosis not present

## 2016-02-23 DIAGNOSIS — E860 Dehydration: Secondary | ICD-10-CM | POA: Diagnosis present

## 2016-02-23 DIAGNOSIS — G4733 Obstructive sleep apnea (adult) (pediatric): Secondary | ICD-10-CM | POA: Diagnosis present

## 2016-02-23 DIAGNOSIS — E1122 Type 2 diabetes mellitus with diabetic chronic kidney disease: Secondary | ICD-10-CM | POA: Diagnosis present

## 2016-02-23 DIAGNOSIS — J9610 Chronic respiratory failure, unspecified whether with hypoxia or hypercapnia: Secondary | ICD-10-CM | POA: Diagnosis present

## 2016-02-23 DIAGNOSIS — Z9981 Dependence on supplemental oxygen: Secondary | ICD-10-CM | POA: Diagnosis not present

## 2016-02-23 DIAGNOSIS — M109 Gout, unspecified: Secondary | ICD-10-CM | POA: Diagnosis present

## 2016-02-23 DIAGNOSIS — Z79899 Other long term (current) drug therapy: Secondary | ICD-10-CM | POA: Diagnosis not present

## 2016-02-23 DIAGNOSIS — E871 Hypo-osmolality and hyponatremia: Secondary | ICD-10-CM | POA: Diagnosis present

## 2016-02-23 DIAGNOSIS — R112 Nausea with vomiting, unspecified: Secondary | ICD-10-CM | POA: Diagnosis present

## 2016-02-23 DIAGNOSIS — Z7982 Long term (current) use of aspirin: Secondary | ICD-10-CM | POA: Diagnosis not present

## 2016-02-23 DIAGNOSIS — E785 Hyperlipidemia, unspecified: Secondary | ICD-10-CM | POA: Diagnosis present

## 2016-02-23 DIAGNOSIS — Z953 Presence of xenogenic heart valve: Secondary | ICD-10-CM | POA: Diagnosis not present

## 2016-02-23 DIAGNOSIS — N183 Chronic kidney disease, stage 3 (moderate): Secondary | ICD-10-CM | POA: Diagnosis not present

## 2016-02-23 DIAGNOSIS — E119 Type 2 diabetes mellitus without complications: Secondary | ICD-10-CM | POA: Diagnosis not present

## 2016-02-23 LAB — COMPREHENSIVE METABOLIC PANEL
ALK PHOS: 76 U/L (ref 38–126)
ALT: 15 U/L — ABNORMAL LOW (ref 17–63)
ANION GAP: 13 (ref 5–15)
AST: 18 U/L (ref 15–41)
Albumin: 3.5 g/dL (ref 3.5–5.0)
BUN: 85 mg/dL — ABNORMAL HIGH (ref 6–20)
CALCIUM: 9.1 mg/dL (ref 8.9–10.3)
CO2: 43 mmol/L — AB (ref 22–32)
Chloride: 80 mmol/L — ABNORMAL LOW (ref 101–111)
Creatinine, Ser: 2.33 mg/dL — ABNORMAL HIGH (ref 0.61–1.24)
GFR calc non Af Amer: 28 mL/min — ABNORMAL LOW (ref >60)
GFR, EST AFRICAN AMERICAN: 32 mL/min — AB (ref >60)
Glucose, Bld: 132 mg/dL — ABNORMAL HIGH (ref 65–99)
Potassium: 3.3 mmol/L — ABNORMAL LOW (ref 3.5–5.1)
SODIUM: 136 mmol/L (ref 135–145)
TOTAL PROTEIN: 7.1 g/dL (ref 6.5–8.1)
Total Bilirubin: 0.4 mg/dL (ref 0.3–1.2)

## 2016-02-23 LAB — CBC
HEMATOCRIT: 40.6 % (ref 39.0–52.0)
HEMOGLOBIN: 12.9 g/dL — AB (ref 13.0–17.0)
MCH: 27.1 pg (ref 26.0–34.0)
MCHC: 31.8 g/dL (ref 30.0–36.0)
MCV: 85.3 fL (ref 78.0–100.0)
Platelets: 306 10*3/uL (ref 150–400)
RBC: 4.76 MIL/uL (ref 4.22–5.81)
RDW: 14.6 % (ref 11.5–15.5)
WBC: 19 10*3/uL — AB (ref 4.0–10.5)

## 2016-02-23 LAB — URINALYSIS, ROUTINE W REFLEX MICROSCOPIC
BILIRUBIN URINE: NEGATIVE
Glucose, UA: NEGATIVE mg/dL
KETONES UR: NEGATIVE mg/dL
NITRITE: NEGATIVE
PROTEIN: 30 mg/dL — AB
SPECIFIC GRAVITY, URINE: 1.013 (ref 1.005–1.030)
pH: 5.5 (ref 5.0–8.0)

## 2016-02-23 LAB — CBC WITH DIFFERENTIAL/PLATELET
Basophils Absolute: 0 10*3/uL (ref 0.0–0.1)
Basophils Relative: 0 %
EOS ABS: 0.3 10*3/uL (ref 0.0–0.7)
EOS PCT: 2 %
HCT: 43.1 % (ref 39.0–52.0)
HEMOGLOBIN: 14.2 g/dL (ref 13.0–17.0)
LYMPHS ABS: 4.3 10*3/uL — AB (ref 0.7–4.0)
Lymphocytes Relative: 25 %
MCH: 27.8 pg (ref 26.0–34.0)
MCHC: 32.9 g/dL (ref 30.0–36.0)
MCV: 84.3 fL (ref 78.0–100.0)
MONOS PCT: 6 %
Monocytes Absolute: 0.9 10*3/uL (ref 0.1–1.0)
NEUTROS PCT: 67 %
Neutro Abs: 11.5 10*3/uL — ABNORMAL HIGH (ref 1.7–7.7)
Platelets: 319 10*3/uL (ref 150–400)
RBC: 5.11 MIL/uL (ref 4.22–5.81)
RDW: 14.3 % (ref 11.5–15.5)
WBC: 17.2 10*3/uL — ABNORMAL HIGH (ref 4.0–10.5)

## 2016-02-23 LAB — CREATININE, SERUM
CREATININE: 2.8 mg/dL — AB (ref 0.61–1.24)
GFR, EST AFRICAN AMERICAN: 26 mL/min — AB (ref >60)
GFR, EST NON AFRICAN AMERICAN: 22 mL/min — AB (ref >60)

## 2016-02-23 LAB — GLUCOSE, CAPILLARY: Glucose-Capillary: 329 mg/dL — ABNORMAL HIGH (ref 65–99)

## 2016-02-23 LAB — LACTIC ACID, PLASMA
LACTIC ACID, VENOUS: 2.3 mmol/L — AB (ref 0.5–2.0)
LACTIC ACID, VENOUS: 2.6 mmol/L — AB (ref 0.5–2.0)
Lactic Acid, Venous: 3 mmol/L (ref 0.5–2.0)

## 2016-02-23 LAB — I-STAT CG4 LACTIC ACID, ED: Lactic Acid, Venous: 2.56 mmol/L (ref 0.5–2.0)

## 2016-02-23 LAB — TROPONIN I: TROPONIN I: 0.04 ng/mL — AB (ref <0.031)

## 2016-02-23 LAB — URINE MICROSCOPIC-ADD ON

## 2016-02-23 LAB — CBG MONITORING, ED
GLUCOSE-CAPILLARY: 336 mg/dL — AB (ref 65–99)
Glucose-Capillary: 230 mg/dL — ABNORMAL HIGH (ref 65–99)

## 2016-02-23 LAB — LIPASE, BLOOD: LIPASE: 47 U/L (ref 11–51)

## 2016-02-23 MED ORDER — ENOXAPARIN SODIUM 80 MG/0.8ML ~~LOC~~ SOLN
80.0000 mg | SUBCUTANEOUS | Status: DC
  Administered 2016-02-24: 80 mg via SUBCUTANEOUS
  Filled 2016-02-23 (×2): qty 0.8

## 2016-02-23 MED ORDER — HYDROCODONE-ACETAMINOPHEN 7.5-325 MG PO TABS
1.0000 | ORAL_TABLET | ORAL | Status: DC | PRN
  Administered 2016-02-23 – 2016-02-27 (×9): 1 via ORAL
  Filled 2016-02-23 (×9): qty 1

## 2016-02-23 MED ORDER — HYDROXYZINE HCL 25 MG PO TABS: 25.0000 mg | ORAL_TABLET | Freq: Four times a day (QID) | ORAL | Status: DC | PRN

## 2016-02-23 MED ORDER — FENOFIBRATE 54 MG PO TABS
54.0000 mg | ORAL_TABLET | Freq: Every day | ORAL | Status: DC
  Administered 2016-02-23 – 2016-02-27 (×5): 54 mg via ORAL
  Filled 2016-02-23 (×5): qty 1

## 2016-02-23 MED ORDER — SODIUM CHLORIDE 0.9 % IV BOLUS (SEPSIS)
500.0000 mL | Freq: Once | INTRAVENOUS | Status: AC
  Administered 2016-02-23: 500 mL via INTRAVENOUS

## 2016-02-23 MED ORDER — DOCUSATE SODIUM 100 MG PO CAPS
100.0000 mg | ORAL_CAPSULE | Freq: Two times a day (BID) | ORAL | Status: DC
  Administered 2016-02-23 – 2016-02-27 (×9): 100 mg via ORAL
  Filled 2016-02-23 (×9): qty 1

## 2016-02-23 MED ORDER — SODIUM CHLORIDE 0.9 % IV BOLUS (SEPSIS)
1000.0000 mL | Freq: Once | INTRAVENOUS | Status: AC
  Administered 2016-02-23: 1000 mL via INTRAVENOUS

## 2016-02-23 MED ORDER — FLUTICASONE PROPIONATE 50 MCG/ACT NA SUSP
1.0000 | Freq: Every day | NASAL | Status: DC
  Administered 2016-02-24 – 2016-02-27 (×4): 1 via NASAL
  Filled 2016-02-23: qty 16

## 2016-02-23 MED ORDER — SODIUM CHLORIDE 0.9 % IV SOLN
Freq: Once | INTRAVENOUS | Status: AC
  Administered 2016-02-23: 01:00:00 via INTRAVENOUS

## 2016-02-23 MED ORDER — COLCHICINE 0.6 MG PO TABS
0.6000 mg | ORAL_TABLET | Freq: Every day | ORAL | Status: DC
  Administered 2016-02-23 – 2016-02-24 (×2): 0.6 mg via ORAL
  Filled 2016-02-23 (×2): qty 1

## 2016-02-23 MED ORDER — SODIUM CHLORIDE 0.9 % IV SOLN
Freq: Once | INTRAVENOUS | Status: AC
  Administered 2016-02-23: 03:00:00 via INTRAVENOUS

## 2016-02-23 MED ORDER — ACETAMINOPHEN 650 MG RE SUPP: 650.0000 mg | Freq: Four times a day (QID) | RECTAL | Status: DC | PRN

## 2016-02-23 MED ORDER — ONDANSETRON HCL 4 MG PO TABS: 4.0000 mg | ORAL_TABLET | Freq: Four times a day (QID) | ORAL | Status: DC | PRN

## 2016-02-23 MED ORDER — ASPIRIN 81 MG PO CHEW
81.0000 mg | CHEWABLE_TABLET | Freq: Every day | ORAL | Status: DC
  Administered 2016-02-23 – 2016-02-27 (×5): 81 mg via ORAL
  Filled 2016-02-23 (×5): qty 1

## 2016-02-23 MED ORDER — INSULIN ASPART 100 UNIT/ML ~~LOC~~ SOLN
0.0000 [IU] | Freq: Every day | SUBCUTANEOUS | Status: DC
  Administered 2016-02-23 – 2016-02-24 (×2): 4 [IU] via SUBCUTANEOUS
  Administered 2016-02-25: 3 [IU] via SUBCUTANEOUS
  Administered 2016-02-26: 2 [IU] via SUBCUTANEOUS

## 2016-02-23 MED ORDER — ONDANSETRON HCL 4 MG/2ML IJ SOLN: 4.0000 mg | Freq: Four times a day (QID) | INTRAMUSCULAR | Status: DC | PRN

## 2016-02-23 MED ORDER — ATORVASTATIN CALCIUM 40 MG PO TABS
40.0000 mg | ORAL_TABLET | Freq: Every day | ORAL | Status: DC
  Administered 2016-02-25 – 2016-02-27 (×3): 40 mg via ORAL
  Filled 2016-02-23 (×4): qty 1

## 2016-02-23 MED ORDER — SODIUM CHLORIDE 0.9% FLUSH
3.0000 mL | Freq: Two times a day (BID) | INTRAVENOUS | Status: DC
  Administered 2016-02-24 – 2016-02-27 (×7): 3 mL via INTRAVENOUS

## 2016-02-23 MED ORDER — SODIUM CHLORIDE 0.9 % IV BOLUS (SEPSIS)
250.0000 mL | Freq: Once | INTRAVENOUS | Status: AC
  Administered 2016-02-23: 250 mL via INTRAVENOUS

## 2016-02-23 MED ORDER — ACETAMINOPHEN 325 MG PO TABS: 650.0000 mg | ORAL_TABLET | Freq: Four times a day (QID) | ORAL | Status: DC | PRN

## 2016-02-23 MED ORDER — INSULIN ASPART 100 UNIT/ML ~~LOC~~ SOLN
0.0000 [IU] | Freq: Three times a day (TID) | SUBCUTANEOUS | Status: DC
  Administered 2016-02-23: 5 [IU] via SUBCUTANEOUS
  Administered 2016-02-24: 8 [IU] via SUBCUTANEOUS
  Administered 2016-02-24: 5 [IU] via SUBCUTANEOUS
  Administered 2016-02-24 – 2016-02-25 (×4): 8 [IU] via SUBCUTANEOUS
  Filled 2016-02-23: qty 1

## 2016-02-23 MED ORDER — FEBUXOSTAT 40 MG PO TABS
40.0000 mg | ORAL_TABLET | Freq: Every day | ORAL | Status: DC
Start: 2016-02-23 — End: 2016-02-27
  Administered 2016-02-24 – 2016-02-27 (×4): 40 mg via ORAL
  Filled 2016-02-23 (×5): qty 1

## 2016-02-23 MED ORDER — METOCLOPRAMIDE HCL 5 MG/ML IJ SOLN
5.0000 mg | Freq: Once | INTRAMUSCULAR | Status: AC
  Administered 2016-02-23: 5 mg via INTRAVENOUS
  Filled 2016-02-23: qty 2

## 2016-02-23 MED ORDER — INSULIN DETEMIR 100 UNIT/ML ~~LOC~~ SOLN
25.0000 [IU] | Freq: Every day | SUBCUTANEOUS | Status: DC
  Administered 2016-02-23: 25 [IU] via SUBCUTANEOUS
  Filled 2016-02-23 (×2): qty 0.25

## 2016-02-23 NOTE — Progress Notes (Signed)
RT NOTE:  Pt wear Auto CPAP @ home. He will wear tonight. RT will bring back for setup around 2330 per patient request

## 2016-02-23 NOTE — ED Notes (Signed)
Admitting phy aware of hypotension

## 2016-02-23 NOTE — ED Notes (Signed)
Blood pressure improved while NS was running; steady decline in blood pressure after fluids finished. Second liter started per Dr.Pollina.

## 2016-02-23 NOTE — ED Notes (Signed)
Pt taken to xray 

## 2016-02-23 NOTE — Progress Notes (Signed)
65 year old male with h/o chronic diastolic heart failure, diabetes mellitus,  presented with nausea, vomiting, acute on CKD, has baseline OSA on CPAP, chronic respiratory failure on 4 lit of [place] oxygen . His symptoms have improved, but further eval revealed elevated lactic acid and hypotension. His UA is negative for infection, and CXR pending.  He is afebrile and wbc [place] of 09233, slightly tachycardic. He received 3 lit of NS boluses, getting the 4 th lit going on.  Monitor.    Kathlen Mody [phone]

## 2016-02-23 NOTE — ED Notes (Signed)
Admitting notified about elevated lactic acid

## 2016-02-23 NOTE — ED Notes (Signed)
Third attempt to obtain urine. Pt informed about order placed for an in and out cath. Pt stated "no, i can pee but i cant right now because there is none in there."  Will try again later

## 2016-02-23 NOTE — ED Notes (Signed)
Meal tray ordered 

## 2016-02-23 NOTE — ED Notes (Signed)
Pt assisted to bedside commode; denies dizziness, steady gait noted. Attempting to use urinal for urine sample

## 2016-02-23 NOTE — H&P (Signed)
PCP:   [name], MD   Chief Complaint:  Abdominal discomfort  HPI: This is a 65 year old male who lacerated his bolus of generalized abdominal discomfort and nausea which has since resolved. He went into developed sudden onset of lightheadedness and dizziness, nausea and vomiting and felt very weak. He denies any hematemesis. He cheCked his FSBS was 125. He states his been urinating all day. He decided to come to the ER. He denies any fever, chills, burning urination or diarrhea. He denies any shortness of breath or coughing. Here in the ER the patient is hypotensive with systolic blood pressure in the 60s. The patient has received a total of 2 L normal saline and systolic blood pressure 68. A urinalysis has not yet been collected but when is ordered. The patient declines to have it in and out catheter.  The patient states that this has occurred a few times before. He states he's never been found to be septic and so she received lots of fluids and become fluid overloaded. He is well cautious that they should not recur today.   Review of Systems:  The patient denies anorexia, fever, weight loss,, vision loss, decreased hearing, hoarseness, chest pain, syncope, dyspnea on exertion, peripheral edema, balance deficits, hemoptysis, abdominal discomfort, lightheaded and dizziness, melena, hematochezia, severe indigestion/heartburn, hematuria, incontinence, genital sores, muscle weakness, suspicious skin lesions, transient blindness, difficulty walking, depression, unusual weight change, abnormal bleeding, enlarged lymph nodes, angioedema, and breast masses.  Past Medical History: Past Medical History  Diagnosis Date  . Blindness of right eye     a. due to amblyopia as child  . Hx MRSA infection     a. thigh abcess 2006  . Anxiety   . Aortic stenosis     a. s/p AVR 11/29/11  . HTN (hypertension)   . HLD (hyperlipidemia)   . Gout   . Chronic diastolic heart failure (HCC)     a. Echo  08/11/2015 LVEF 55-60% with normal RV function  . DM2 (diabetes mellitus, type 2) (HCC)   . OSA on CPAP   . Hepatitis   . Arthritis   . History of acute renal failure    Past Surgical History  Procedure Laterality Date  . Lumbar laminectomy  10/30/1983  . Aortic valve replacement  11/29/2011    Procedure: AORTIC VALVE REPLACEMENT (AVR);  Surgeon: [name], MD;  Location: [facility] OR;  Service: Open Heart Surgery;  Laterality: N/A;  with nitric oxide  . Right heart catheterization N/A 11/28/2011    Procedure: RIGHT HEART CATH;  Surgeon: [name], MD;  Location: [facility] CATH LAB;  Service: Cardiovascular;  Laterality: N/A;  . Cardiac valve replacement    . Incision and drainage abscess Left 08/2005    "upper/inner thigh; that's where the MRSA was"  . Back surgery    . Cardiac catheterization    . Cardiac catheterization N/A 10/14/2015    Procedure: Right Heart Cath;  Surgeon: [name], MD;  Location: [facility] INVASIVE CV LAB;  Service: Cardiovascular;  Laterality: N/A;    Medications: Prior to Admission medications   Medication Sig Start Date End Date Taking? Authorizing Provider  aspirin 81 MG tablet Take 81 mg by mouth daily.   Yes Historical Provider, MD  atorvastatin (LIPITOR) 40 MG tablet Take 40 mg by mouth daily at 6 PM.  12/05/14  Yes Historical Provider, MD  colchicine 0.6 MG tablet Take 0.6 mg by mouth daily.  09/01/14  Yes Historical Provider, MD  febuxostat (ULORIC) 40 MG tablet Take 40 mg by mouth daily.    Yes Historical Provider, MD  fenofibrate 54 MG tablet Take 54 mg by mouth daily. 05/04/15  Yes Historical Provider, MD  fluticasone (FLONASE) 50 MCG/ACT nasal spray Place 1 spray into both nostrils daily. 01/09/16  Yes [name], MD  hydrocerin (EUCERIN) CREA Apply 1 application topically 2 (two) times daily. 09/19/15  Yes [name], PA-C  HYDROcodone-acetaminophen (NORCO) 7.5-325 MG tablet Take 1 tablet by mouth every 4 (four) hours as needed for  moderate pain. 01/09/16  Yes [name], MD  hydrOXYzine (ATARAX/VISTARIL) 25 MG tablet Take 25 mg by mouth every 6 (six) hours as needed for itching.   Yes Historical Provider, MD  insulin detemir (LEVEMIR) 100 UNIT/ML injection 25units qam and 30 units qhs Patient taking differently: Inject 30-35 Units into the skin 2 (two) times daily. 30units qam and 35 units qhs 01/09/16  Yes [name], MD  lidocaine (LINDAMANTLE) 3 % CREA cream Apply 1 application topically 3 (three) times daily as needed (rash).   Yes Historical Provider, MD  NOVOLOG 100 UNIT/ML injection Inject 30-40 Units into the skin 3 (three) times daily with meals. Sliding scale 05/12/15  Yes Historical Provider, MD  OXYGEN Inhale 4 L into the lungs at bedtime.   Yes Historical Provider, MD  potassium chloride SA (K-DUR,KLOR-CON) 20 MEQ tablet Take 2 tablets (40 mEq total) by mouth daily. Take 3 tabs in AM and 2 tabs in PM Patient taking differently: Take 40 mEq by mouth 2 (two) times daily.  01/09/16  Yes [name], MD  sodium chloride (OCEAN) 0.65 % SOLN nasal spray Place 1 spray into both nostrils as needed for congestion. 01/09/16  Yes [name], MD  torsemide (DEMADEX) 20 MG tablet Take 4 tablets (80 mg total) by mouth 2 (two) times daily. 01/09/16  Yes [name], MD    Allergies:   Allergies  Allergen Reactions  . Other Other (See Comments)    Salt causes "extreme" edema.    Social History:  reports that he has never smoked. He has never used smokeless tobacco. He reports that he drinks alcohol. He reports that he does not use illicit drugs.  Family History: Family History  Problem Relation Age of Onset  . Heart attack Father   . Cancer Mother     Physical Exam: Filed Vitals:   02/23/16 0415 02/23/16 0430 02/23/16 0533 02/23/16 0534  BP: 105/69 110/95 62/36   Pulse: 88 88  90  Temp:      TempSrc:      Resp: Height:      Weight:      SpO2: 97% 99%  91%    General:  Alert and oriented times three, well developed  and nourished, no acute distress, Morbidly obese Eyes: PERRLA, pink conjunctiva, no scleral icterus ENT: Moist oral mucosa, neck supple, no thyromegaly Lungs: clear to ascultation, no wheeze, no crackles, no use of accessory muscles Cardiovascular: regular rate and rhythm, no regurgitation, no gallops, no murmurs. No carotid bruits, no JVD Abdomen: soft, positive BS, non-tender, non-distended, no organomegaly, not an acute abdomen GU: not examined Neuro: CN II - XII grossly intact, sensation intact Musculoskeletal: strength 5/5 all extremities, no clubbing, cyanosis or edema Skin: no rash, no subcutaneous crepitation, no decubitus, dry skin, no edema Psych: appropriate patient   Labs on Admission:   Recent Labs  02/23/16 0023  NA 136  K 3.3*  CL  80*  CO2 43*  GLUCOSE 132*  BUN 85*  CREATININE 2.33*  CALCIUM 9.1    Recent Labs  02/23/16 0023  AST 18  ALT 15*  ALKPHOS 76  BILITOT 0.4  PROT 7.1  ALBUMIN 3.5    Recent Labs  02/23/16 0023  LIPASE 47    Recent Labs  02/23/16 0023  WBC 17.2*  NEUTROABS 11.5*  HGB 14.2  HCT 43.1  MCV 84.3  PLT 319    Recent Labs  02/23/16 0023  TROPONINI 0.04*   Invalid input(s): POCBNP No results for input(s): DDIMER in the last 72 hours. No results for input(s): HGBA1C in the last 72 hours. No results for input(s): CHOL, HDL, LDLCALC, TRIG, CHOLHDL, LDLDIRECT in the last 72 hours. No results for input(s): TSH, T4TOTAL, T3FREE, THYROIDAB in the last 72 hours.  Invalid input(s): FREET3 No results for input(s): VITAMINB12, FOLATE, FERRITIN, TIBC, IRON, RETICCTPCT in the last 72 hours.  Micro Results: No results found for this or any previous visit (from the past 240 hour(s)).   Radiological Exams on Admission: Dg Abd Acute W/chest  02/23/2016  CLINICAL DATA:  Vomiting EXAM: DG ABDOMEN ACUTE W/ 1V CHEST COMPARISON:  02/08/2016 FINDINGS: Nonobstructive bowel gas pattern. No evidence of pneumoperitoneum or pneumatosis.  Low volume chest with streaky basilar opacities attributed atelectasis. No edema, effusion, or pneumothorax. Chronic cardiomegaly. Status post aortic valve replacement. IMPRESSION: 1. Normal bowel gas pattern. 2. Low volume chest with basilar atelectasis. Electronically Signed   By: Marnee Spring M.D.   On: 02/23/2016 00:51    Assessment/Plan Present on Admission:  . Hypotension . Nausea and vomiting -Admit to med telemetry -Continue IV fluid hydration. Patient reports on his last admission he received a total of 5 L normal saline and became very fluid overloaded. He is concerned about the recurrence of this. This is patient's third liter normal saline back -Antiemetics when necessary ordered  -Blood cultures and urine cultures ordered -No antibiotics currently started  . Acute on chronic diastolic HF (heart failure) (HCC) -Aware, monitor  . acute and chronic CKD (chronic kidney disease), stage III  -Patient being hydrated. Repeat BMP in a.m.  Marland Kitchen Hyperlipidemia -Stable, resume home medication  . OSA on CPAP Morbid obesity -CPAP ordered  Chronic respiratory failure on 4 L oxygen at home -Oxygen restarted   Antonio [name] 02/23/2016, 5:52 AM

## 2016-02-24 ENCOUNTER — Inpatient Hospital Stay (HOSPITAL_COMMUNITY): Payer: Medicare Other

## 2016-02-24 DIAGNOSIS — Z794 Long term (current) use of insulin: Secondary | ICD-10-CM

## 2016-02-24 DIAGNOSIS — I5032 Chronic diastolic (congestive) heart failure: Secondary | ICD-10-CM

## 2016-02-24 DIAGNOSIS — N179 Acute kidney failure, unspecified: Principal | ICD-10-CM

## 2016-02-24 DIAGNOSIS — E119 Type 2 diabetes mellitus without complications: Secondary | ICD-10-CM

## 2016-02-24 DIAGNOSIS — N183 Chronic kidney disease, stage 3 (moderate): Secondary | ICD-10-CM

## 2016-02-24 LAB — BASIC METABOLIC PANEL
Anion gap: 15 (ref 5–15)
Anion gap: 13 (ref 5–15)
BUN: 105 mg/dL — AB (ref 6–20)
BUN: 96 mg/dL — ABNORMAL HIGH (ref 6–20)
CHLORIDE: 85 mmol/L — AB (ref 101–111)
CO2: 34 mmol/L — ABNORMAL HIGH (ref 22–32)
CO2: 37 mmol/L — ABNORMAL HIGH (ref 22–32)
CREATININE: 3.65 mg/dL — AB (ref 0.61–1.24)
Calcium: 8.3 mg/dL — ABNORMAL LOW (ref 8.9–10.3)
Calcium: 8.6 mg/dL — ABNORMAL LOW (ref 8.9–10.3)
Chloride: 83 mmol/L — ABNORMAL LOW (ref 101–111)
Creatinine, Ser: 2.61 mg/dL — ABNORMAL HIGH (ref 0.61–1.24)
GFR calc Af Amer: 19 mL/min — ABNORMAL LOW (ref >60)
GFR calc non Af Amer: 24 mL/min — ABNORMAL LOW (ref >60)
GFR, EST AFRICAN AMERICAN: 28 mL/min — AB (ref >60)
GFR, EST NON AFRICAN AMERICAN: 16 mL/min — AB (ref >60)
GLUCOSE: 355 mg/dL — AB (ref 65–99)
Glucose, Bld: 247 mg/dL — ABNORMAL HIGH (ref 65–99)
POTASSIUM: 4 mmol/L (ref 3.5–5.1)
POTASSIUM: 3.8 mmol/L (ref 3.5–5.1)
SODIUM: 135 mmol/L (ref 135–145)
Sodium: 132 mmol/L — ABNORMAL LOW (ref 135–145)

## 2016-02-24 LAB — GLUCOSE, CAPILLARY
Glucose-Capillary: 297 mg/dL — ABNORMAL HIGH (ref 65–99)
Glucose-Capillary: 257 mg/dL — ABNORMAL HIGH (ref 65–99)
Glucose-Capillary: 243 mg/dL — ABNORMAL HIGH (ref 65–99)
Glucose-Capillary: 309 mg/dL — ABNORMAL HIGH (ref 65–99)

## 2016-02-24 LAB — CBC
HEMATOCRIT: 37.1 % — AB (ref 39.0–52.0)
Hemoglobin: 11.7 g/dL — ABNORMAL LOW (ref 13.0–17.0)
MCH: 27 pg (ref 26.0–34.0)
MCHC: 31.5 g/dL (ref 30.0–36.0)
MCV: 85.7 fL (ref 78.0–100.0)
PLATELETS: 245 10*3/uL (ref 150–400)
RBC: 4.33 MIL/uL (ref 4.22–5.81)
RDW: 15 % (ref 11.5–15.5)
WBC: 11.2 10*3/uL — ABNORMAL HIGH (ref 4.0–10.5)

## 2016-02-24 LAB — LACTIC ACID, PLASMA: Lactic Acid, Venous: 1.9 mmol/L (ref 0.5–2.0)

## 2016-02-24 MED ORDER — INSULIN ASPART 100 UNIT/ML ~~LOC~~ SOLN
5.0000 [IU] | Freq: Three times a day (TID) | SUBCUTANEOUS | Status: DC
  Administered 2016-02-24 – 2016-02-25 (×4): 5 [IU] via SUBCUTANEOUS

## 2016-02-24 MED ORDER — TORSEMIDE 20 MG PO TABS
80.0000 mg | ORAL_TABLET | Freq: Two times a day (BID) | ORAL | Status: DC
Start: 2016-02-25 — End: 2016-02-27
  Administered 2016-02-25 – 2016-02-27 (×6): 80 mg via ORAL
  Filled 2016-02-24 (×7): qty 4

## 2016-02-24 MED ORDER — COLCHICINE 0.6 MG PO TABS
0.6000 mg | ORAL_TABLET | Freq: Every day | ORAL | Status: DC
  Administered 2016-02-24 – 2016-02-27 (×4): 0.6 mg via ORAL
  Filled 2016-02-24 (×4): qty 1

## 2016-02-24 MED ORDER — INSULIN DETEMIR 100 UNIT/ML ~~LOC~~ SOLN
25.0000 [IU] | Freq: Two times a day (BID) | SUBCUTANEOUS | Status: DC
  Administered 2016-02-24 – 2016-02-25 (×2): 25 [IU] via SUBCUTANEOUS
  Filled 2016-02-24 (×3): qty 0.25

## 2016-02-24 NOTE — Consult Note (Signed)
Reason for Consult: AKI/CKD Referring Physician: Foye Clock is an 65 y.o. male.  HPI: Pt is a 64yo WM with PMH sig for morbid obesity, OSA on CPAP, DM, HTN, AVR, chronic diastolic CHF who was admitted on 02/22/16 after developing lightheadedness/dizzines, nausea, and vomiting.   He was found to be profoundly hypotensive with SBP in the 60's and was admitted for further evaluation.  He was also noted to have AKI/CKD (baseline Scr 1.3-1.8) with Scr that rose to 3.65 despite IVF's and we were asked to help evaluate and manage his AKI/CKD.  He was recently discharged from [facility] on 02/16/16 with AKI/CKD related to volume depletion from diarrhea.  He was treated with IVF's and unfortunately developed acute on chronic diastolic CHF with 25lb weight gain and subsequently diuresed by the heart failure team and discharged home.  He continued to lose weight after discharge and was feeling well until the day of admission.  His diuretic regimen was increased at time of his discharge and was taking high dose torsemide (80mg  bid) and metolazone prior to this admission.  He was not on an ACE/ARB and denies any NSAIDs or Cox-II I's.  No fevers, chills, dysuria, pyuria, hematuria, foamy frothy urine, and no family history of kidney disease.  The trend in Scr is seen below.  IVF's were stopped this morning due to previous hospitalization complicated by volume overload.  He feels better but remains weak and concerned about the frequent episodes of AKI/CKD (similar event happened in [place], [place] when he was at a weight loss clinic there and was admitted to [facility] with AKI and dehydration).  Trend in Creatinine: CREATININE, SER  Date/Time Value Ref Range Status  02/24/2016 01:15 PM 2.61* 0.61 - 1.24 mg/dL Final  40/98/1191 47:82 AM 3.65* 0.61 - 1.24 mg/dL Final  95/62/1308 65:78 AM 2.80* 0.61 - 1.24 mg/dL Final  46/96/2952 84:13 AM 2.33* 0.61 - 1.24 mg/dL Final  24/40/1027 25:36 AM 1.38* 0.61 - 1.24 mg/dL Final   64/40/3474 25:95 AM 1.28* 0.61 - 1.24 mg/dL Final  63/87/5643 32:95 AM 1.18 0.61 - 1.24 mg/dL Final  18/84/1660 63:01 AM 1.27* 0.61 - 1.24 mg/dL Final  60/07/9322 55:73 AM 1.55* 0.61 - 1.24 mg/dL Final  22/11/5425 06:23 AM 1.31* 0.61 - 1.24 mg/dL Final  76/28/3151 76:16 AM 1.54* 0.61 - 1.24 mg/dL Final  07/37/1062 69:48 AM 1.80* 0.61 - 1.24 mg/dL Final  54/62/7035 00:93 PM 2.28* 0.61 - 1.24 mg/dL Final  81/82/9937 16:96 PM 1.59* 0.61 - 1.24 mg/dL Final  78/93/8101 75:10 AM 1.45* 0.61 - 1.24 mg/dL Final  25/85/2778 24:23 AM 1.34* 0.61 - 1.24 mg/dL Final  53/61/4431 54:00 AM 1.51* 0.61 - 1.24 mg/dL Final  86/76/1950 93:26 AM 1.47* 0.61 - 1.24 mg/dL Final  71/24/5809 98:33 AM 1.45* 0.61 - 1.24 mg/dL Final  82/50/5397 67:34 AM 1.30* 0.61 - 1.24 mg/dL Final  19/37/9024 09:73 AM 1.33* 0.61 - 1.24 mg/dL Final  53/29/9242 68:34 AM 1.25* 0.61 - 1.24 mg/dL Final  19/62/2297 98:92 PM 1.63* 0.61 - 1.24 mg/dL Final  11/94/1740 81:44 PM 1.76* 0.61 - 1.24 mg/dL Final  81/85/6314 97:02 AM 1.87* 0.61 - 1.24 mg/dL Final  63/78/5885 02:77 AM 2.28* 0.61 - 1.24 mg/dL Final  41/28/7867 67:20 AM 1.68* 0.61 - 1.24 mg/dL Final  94/70/9628 36:62 AM 1.68* 0.61 - 1.24 mg/dL Final  94/76/5465 03:54 AM 1.56* 0.61 - 1.24 mg/dL Final  65/68/1275 17:00 AM 1.43* 0.61 - 1.24 mg/dL Final  17/49/4496 75:91 PM 1.50* 0.61 -  1.24 mg/dL Final  16/07/9603 54:09 AM 1.68* 0.61 - 1.24 mg/dL Final  81/19/1478 29:56 AM 1.76* 0.61 - 1.24 mg/dL Final  21/30/8657 84:69 AM 1.75* 0.61 - 1.24 mg/dL Final  62/95/2841 32:44 AM 1.80* 0.61 - 1.24 mg/dL Final  10/31/7251 66:44 AM 1.62* 0.61 - 1.24 mg/dL Final  03/47/4259 56:38 AM 1.64* 0.61 - 1.24 mg/dL Final  75/64/3329 51:88 PM 1.51* 0.61 - 1.24 mg/dL Final  41/66/0630 16:01 AM 1.27* 0.61 - 1.24 mg/dL Final  09/32/3557 32:20 AM 1.55* 0.61 - 1.24 mg/dL Final  25/42/7062 37:62 AM 1.33* 0.61 - 1.24 mg/dL Final  83/15/1761 60:73 AM 1.33* 0.61 - 1.24 mg/dL Final  71/03/2693 85:46 AM 1.23  0.61 - 1.24 mg/dL Final  27/12/5007 38:18 AM 1.26* 0.61 - 1.24 mg/dL Final  29/93/7169 67:89 AM 1.26* 0.61 - 1.24 mg/dL Final  38/07/1750 02:58 AM 1.38* 0.61 - 1.24 mg/dL Final  52/77/8242 35:36 PM 1.40* 0.61 - 1.24 mg/dL Final  14/43/1540 08:67 PM 2.08* 0.40 - 1.50 mg/dL Final  61/95/0932 67:12 AM 1.17 0.50 - 1.35 mg/dL Final  45/80/9983 38:25 AM 1.33 0.50 - 1.35 mg/dL Final  05/39/7673 41:93 AM 1.13 0.50 - 1.35 mg/dL Final  79/11/4095 35:32 AM 1.11 0.50 - 1.35 mg/dL Final    PMH:   Past Medical History  Diagnosis Date  . Blindness of right eye     a. due to amblyopia as child  . Hx MRSA infection     a. thigh abcess 2006  . Anxiety   . Aortic stenosis     a. s/p AVR 11/29/11  . HTN (hypertension)   . HLD (hyperlipidemia)   . Gout   . Chronic diastolic heart failure (HCC)     a. Echo 08/11/2015 LVEF 55-60% with normal RV function  . DM2 (diabetes mellitus, type 2) (HCC)   . OSA on CPAP   . Hepatitis   . Arthritis   . History of acute renal failure     PSH:   Past Surgical History  Procedure Laterality Date  . Lumbar laminectomy  10/30/1983  . Aortic valve replacement  11/29/2011    Procedure: AORTIC VALVE REPLACEMENT (AVR);  Surgeon: [name], MD;  Location: [facility] OR;  Service: Open Heart Surgery;  Laterality: N/A;  with nitric oxide  . Right heart catheterization N/A 11/28/2011    Procedure: RIGHT HEART CATH;  Surgeon: [name], MD;  Location: [facility] CATH LAB;  Service: Cardiovascular;  Laterality: N/A;  . Cardiac valve replacement    . Incision and drainage abscess Left 08/2005    "upper/inner thigh; that's where the MRSA was"  . Back surgery    . Cardiac catheterization    . Cardiac catheterization N/A 10/14/2015    Procedure: Right Heart Cath;  Surgeon: [name], MD;  Location: [facility] INVASIVE CV LAB;  Service: Cardiovascular;  Laterality: N/A;    Allergies:  Allergies  Allergen Reactions  . Other Other (See Comments)    Salt causes  "extreme" edema.    Medications:   Prior to Admission medications   Medication Sig Start Date End Date Taking? Authorizing Provider  aspirin 81 MG tablet Take 81 mg by mouth daily.   Yes Historical Provider, MD  atorvastatin (LIPITOR) 40 MG tablet Take 40 mg by mouth daily at 6 PM.  12/05/14  Yes Historical Provider, MD  colchicine 0.6 MG tablet Take 0.6 mg by mouth daily.  09/01/14  Yes Historical Provider, MD  febuxostat (ULORIC) 40 MG tablet Take  40 mg by mouth daily.    Yes Historical Provider, MD  fenofibrate 54 MG tablet Take 54 mg by mouth daily. 05/04/15  Yes Historical Provider, MD  fluticasone (FLONASE) 50 MCG/ACT nasal spray Place 1 spray into both nostrils daily. 01/09/16  Yes [name], MD  hydrocerin (EUCERIN) CREA Apply 1 application topically 2 (two) times daily. 09/19/15  Yes [name], PA-C  HYDROcodone-acetaminophen (NORCO) 7.5-325 MG tablet Take 1 tablet by mouth every 4 (four) hours as needed for moderate pain. 01/09/16  Yes [name], MD  hydrOXYzine (ATARAX/VISTARIL) 25 MG tablet Take 25 mg by mouth every 6 (six) hours as needed for itching.   Yes Historical Provider, MD  insulin detemir (LEVEMIR) 100 UNIT/ML injection 25units qam and 30 units qhs Patient taking differently: Inject 30-35 Units into the skin 2 (two) times daily. 30units qam and 35 units qhs 01/09/16  Yes [name], MD  lidocaine (LINDAMANTLE) 3 % CREA cream Apply 1 application topically 3 (three) times daily as needed (rash).   Yes Historical Provider, MD  NOVOLOG 100 UNIT/ML injection Inject 30-40 Units into the skin 3 (three) times daily with meals. Sliding scale 05/12/15  Yes Historical Provider, MD  OXYGEN Inhale 4 L into the lungs at bedtime.   Yes Historical Provider, MD  potassium chloride SA (K-DUR,KLOR-CON) 20 MEQ tablet Take 2 tablets (40 mEq total) by mouth daily. Take 3 tabs in AM and 2 tabs in PM Patient taking differently: Take 40 mEq by mouth 2 (two) times daily.  01/09/16  Yes [name], MD   sodium chloride (OCEAN) 0.65 % SOLN nasal spray Place 1 spray into both nostrils as needed for congestion. 01/09/16  Yes [name], MD  torsemide (DEMADEX) 20 MG tablet Take 4 tablets (80 mg total) by mouth 2 (two) times daily. 01/09/16  Yes [name], MD    Inpatient medications: . aspirin  81 mg Oral Daily  . atorvastatin  40 mg Oral q1800  . docusate sodium  100 mg Oral BID  . enoxaparin (LOVENOX) injection  80 mg Subcutaneous Q24H  . febuxostat  40 mg Oral Daily  . fenofibrate  54 mg Oral Daily  . fluticasone  1 spray Each Nare Daily  . insulin aspart  0-15 Units Subcutaneous TID WC  . insulin aspart  0-5 Units Subcutaneous QHS  . insulin aspart  5 Units Subcutaneous TID WC  . insulin detemir  25 Units Subcutaneous BID  . sodium chloride flush  3 mL Intravenous Q12H    Discontinued Meds:   Medications Discontinued During This Encounter  Medication Reason  . insulin detemir (LEVEMIR) injection 25 Units   . colchicine tablet 0.6 mg     Social History:  reports that he has never smoked. He has never used smokeless tobacco. He reports that he drinks alcohol. He reports that he does not use illicit drugs.  Family History:   Family History  Problem Relation Age of Onset  . Heart attack Father   . Cancer Mother     Pertinent items are noted in HPI. Weight change: 5.897 kg (13 lb)  Intake/Output Summary (Last 24 hours) at 02/24/16 1214 Last data filed at 02/24/16 0935  Gross per 24 hour  Intake    608 ml  Output   1100 ml  Net   -492 ml   BP 112/65 mmHg  Pulse 85  Temp(Src) 98.2 F (36.8 C) (Oral)  Resp 13  Ht  (1.803 m)  Wt 172.82 kg (  381 lb)  BMI 53.16 kg/m2  SpO2 97% Filed Vitals:   02/24/16 0000 02/24/16 0410 02/24/16 0732 02/24/16 1203  BP:  98/47 104/63 112/65  Pulse:   90 85  Temp: 97.8 F (36.6 C) 97.7 F (36.5 C) 97.5 F (36.4 C) 98.2 F (36.8 C)  TempSrc: Oral Oral Oral Oral  Resp: 19 16 17 13   Height:      Weight:      SpO2: 92% 98% 93% 97%      General appearance: alert, cooperative, no distress and morbidly obese Head: Normocephalic, without obvious abnormality, atraumatic Neck: no adenopathy, no carotid bruit and supple, symmetrical, trachea midline Resp: clear to auscultation bilaterally Cardio: regular rate and rhythm and no rub GI: soft, non-tender; bowel sounds normal; no masses,  no organomegaly Extremities: edema trace presacral edema  Labs: Basic Metabolic Panel:  Recent Labs Lab 02/23/16 0023 02/23/16 0720 02/24/16 0347  NA 136  --  132*  K 3.3*  --  4.0  CL 80*  --  83*  CO2 43*  --  34*  GLUCOSE 132*  --  355*  BUN 85*  --  105*  CREATININE 2.33* 2.80* 3.65*  ALBUMIN 3.5  --   --   CALCIUM 9.1  --  8.3*   Liver Function Tests:  Recent Labs Lab 02/23/16 0023  AST 18  ALT 15*  ALKPHOS 76  BILITOT 0.4  PROT 7.1  ALBUMIN 3.5    Recent Labs Lab 02/23/16 0023  LIPASE 47   No results for input(s): AMMONIA in the last 168 hours. CBC:  Recent Labs Lab 02/23/16 0023 02/23/16 0720 02/24/16 0347  WBC 17.2* 19.0* 11.2*  NEUTROABS 11.5*  --   --   HGB 14.2 12.9* 11.7*  HCT 43.1 40.6 37.1*  MCV 84.3 85.3 85.7  PLT 319 306 245   PT/INR: @LABRCNTIP (inr:5) Cardiac Enzymes: ) Recent Labs Lab 02/23/16 0023  TROPONINI 0.04*   CBG:  Recent Labs Lab 02/23/16 0826 02/23/16 1448 02/23/16 2145 02/24/16 0731  GLUCAP 230* 336* 329* 297*    Iron Studies: No results for input(s): IRON, TIBC, TRANSFERRIN, FERRITIN in the last 168 hours.  Xrays/Other Studies: Dg Chest 2 View  02/23/2016  CLINICAL DATA:  Nausea, vomiting, fluid overload EXAM: CHEST  2 VIEW COMPARISON:  02/23/2016 FINDINGS: Study is limited by patient's large body habitus and poor inspiration. No acute infiltrate or pulmonary edema. Status post median sternotomy. Stable bilateral basilar atelectasis or scarring. No definite superimposed infiltrate or pulmonary edema. IMPRESSION: Limited study by poor inspiration and  patient's large body habitus. Status post median sternotomy. Stable bilateral basilar atelectasis or scarring. No definite superimposed infiltrate or pulmonary edema. Electronically Signed   By: Natasha Mead M.D.   On: 02/23/2016 10:16   Dg Abd Acute W/chest  02/23/2016  CLINICAL DATA:  Vomiting EXAM: DG ABDOMEN ACUTE W/ 1V CHEST COMPARISON:  02/08/2016 FINDINGS: Nonobstructive bowel gas pattern. No evidence of pneumoperitoneum or pneumatosis. Low volume chest with streaky basilar opacities attributed atelectasis. No edema, effusion, or pneumothorax. Chronic cardiomegaly. Status post aortic valve replacement. IMPRESSION: 1. Normal bowel gas pattern. 2. Low volume chest with basilar atelectasis. Electronically Signed   By: Marnee Spring M.D.   On: 02/23/2016 00:51     Assessment/Plan: 1.  AKI/CKD in setting of volume depletion and hypotension.   2. Hypotension- presumably due to volume depletion, however he has an elevated WBC at time of admission.  Will pan culture to r/o early sepsis.  Improved after  IVF's (currently 2 liters positive since admission).  Currently off of IVF's.  Will also check cortisol level in am to r/o adrenal insufficiency and TSH. 3. Chronic diastolic CHF- difficult to manage due to nonadherence to low sodium diet and fluid restriction.  Will need lower dose of torsemide at discharge and continued education on weight management at home and f/u with heart failure team 1. His discharge weight was 169kg on 02/16/16 and admission weight was 167kg.  He is now 172.8kg.  Need to determine good dry weight for him probably around 170kg 4. OSA- continue with CPAP. 5. HTN- stable for now. 6. Morbid obesity- he may benefit from bariatric surgery and referral to weight loss clinic. 7. DM type 2- poorly controlled. 8. Hyponatremia- due to #1-3.   Uzay Louderback Jeromey Kruer 02/24/2016, 12:14 PM

## 2016-02-24 NOTE — Consult Note (Signed)
Advanced Heart Failure Team Consult Note  Referring Physician: Dr Blake Divine Primary Physician: Dr Leonette Most [facility]) Primary Cardiologist:  Dr. Shirlee Latch   Reason for Consultation: Chronic diastolic HF/AKI  HPI:    65 yo with bioprosthetic AVR, chronic diastolic CHF, CKD and morbid obesity presents for CHF clinic followup. Last echo in 10/16 showed normal bioprosthetic aortic valve and normal EF.  Last seen in HF clinic 01/20/16. Thought to be stable, but noted dietary non compliance. Weight at that visit was 367 lbs, standing.    Admitted 02/08/16 with 2 week history of diarrhea and hypotension after presenting to PCP appointment. He had an initial weight of 375 lbs. He was treated for his hypotension with aggressive IVF rehydratation and a repeat weight later that day was 394 lbs. Was placed on IV diuretics and weight down 23 lbs from high weight that admission. Discharge weight 373 lbs.   Presented to [facility] late evening of 02/22/16 into am of 02/23/16 with sudden onset of lightheadedness and dizziness with N/V and weakness.  Found to be hypotensive in 60s. UA negative. Given 4L of fluid resuscitation. Creatinine peaked at 3.65 (up from 1.38 on 02/16/16)  HF team consulted with concerns for recurrent volume overload with fluid rehydration. Blood Cx NGTD.  States feeling better today. He thinks at home he was taking metolazone every day.  Thinks it was placed in his pill box.  Was at least taking every other day, instead of as needed only.  Unsure why he did it this way.   Continues to drink lots of fluid at home to try and manage his gout. Doesn't really watch what he eats.  Has food delivered from local restaurant for multiple meals a week.  Denies SOB currently.  No CP. No further lightheadedness or dizziness.  Getting some swelling in his legs but not as bad as last admission.   So far only positive 2.2 L. Looks like weight up ~ 10 lbs from admission. No weight this am.   Review of Systems:  [y] = yes, [ ]  = no   General: Weight gain [y]; Weight loss [ ] ; Anorexia [ ] ; Fatigue [ ] ; Fever [ ] ; Chills [ ] ; Weakness [ ]   Cardiac: Chest pain/pressure [ ] ; Resting SOB [ ] ; Exertional SOB [ ] ; Orthopnea [y]; Pedal Edema [y]; Palpitations [ ] ; Syncope [ ] ; Presyncope [y]; Paroxysmal nocturnal dyspnea[ ]   Pulmonary: Cough [ ] ; Wheezing[ ] ; Hemoptysis[ ] ; Sputum [ ] ; Snoring [ ]   GI: Vomiting[ ] ; Dysphagia[ ] ; Melena[ ] ; Hematochezia [ ] ; Heartburn[ ] ; Abdominal pain [ ] ; Constipation [ ] ; Diarrhea [ ] ; BRBPR [ ]   GU: Hematuria[ ] ; Dysuria [ ] ; Nocturia[ ]   Vascular: Pain in legs with walking [ ] ; Pain in feet with lying flat [ ] ; Non-healing sores [ ] ; Stroke [ ] ; TIA [ ] ; Slurred speech [ ] ;  Neuro: Headaches[ ] ; Vertigo[ ] ; Seizures[ ] ; Paresthesias[ ] ;Blurred vision [ ] ; Diplopia [ ] ; Vision changes [ ]   Ortho/Skin: Arthritis [ ] ; Joint pain [y]; Muscle pain [ ] ; Joint swelling [ ] ; Back Pain [ ] ; Rash [ ]   Psych: Depression[ ] ; Anxiety[ ]   Heme: Bleeding problems [ ] ; Clotting disorders [ ] ; Anemia [ ]   Endocrine: Diabetes [ ] ; Thyroid dysfunction[ ]   Home Medications Prior to Admission medications   Medication Sig Start Date End Date Taking? Authorizing Provider  aspirin 81 MG tablet Take 81 mg by mouth daily.   Yes Historical Provider, MD  atorvastatin (LIPITOR) 40 MG tablet Take 40 mg by mouth daily at 6 PM.  12/05/14  Yes Historical Provider, MD  colchicine 0.6 MG tablet Take 0.6 mg by mouth daily.  09/01/14  Yes Historical Provider, MD  febuxostat (ULORIC) 40 MG tablet Take 40 mg by mouth daily.    Yes Historical Provider, MD  fenofibrate 54 MG tablet Take 54 mg by mouth daily. 05/04/15  Yes Historical Provider, MD  fluticasone (FLONASE) 50 MCG/ACT nasal spray Place 1 spray into both nostrils daily. 01/09/16  Yes [name], MD  hydrocerin (EUCERIN) CREA Apply 1 application topically 2 (two) times daily. 09/19/15  Yes [name], PA-C  HYDROcodone-acetaminophen (NORCO)  7.5-325 MG tablet Take 1 tablet by mouth every 4 (four) hours as needed for moderate pain. 01/09/16  Yes [name], MD  hydrOXYzine (ATARAX/VISTARIL) 25 MG tablet Take 25 mg by mouth every 6 (six) hours as needed for itching.   Yes Historical Provider, MD  insulin detemir (LEVEMIR) 100 UNIT/ML injection 25units qam and 30 units qhs Patient taking differently: Inject 30-35 Units into the skin 2 (two) times daily. 30units qam and 35 units qhs 01/09/16  Yes [name], MD  lidocaine (LINDAMANTLE) 3 % CREA cream Apply 1 application topically 3 (three) times daily as needed (rash).   Yes Historical Provider, MD  NOVOLOG 100 UNIT/ML injection Inject 30-40 Units into the skin 3 (three) times daily with meals. Sliding scale 05/12/15  Yes Historical Provider, MD  OXYGEN Inhale 4 L into the lungs at bedtime.   Yes Historical Provider, MD  potassium chloride SA (K-DUR,KLOR-CON) 20 MEQ tablet Take 2 tablets (40 mEq total) by mouth daily. Take 3 tabs in AM and 2 tabs in PM Patient taking differently: Take 40 mEq by mouth 2 (two) times daily.  01/09/16  Yes [name], MD  sodium chloride (OCEAN) 0.65 % SOLN nasal spray Place 1 spray into both nostrils as needed for congestion. 01/09/16  Yes [name], MD  torsemide (DEMADEX) 20 MG tablet Take 4 tablets (80 mg total) by mouth 2 (two) times daily. 01/09/16  Yes [name], MD    Past Medical History: Past Medical History  Diagnosis Date  . Blindness of right eye     a. due to amblyopia as child  . Hx MRSA infection     a. thigh abcess 2006  . Anxiety   . Aortic stenosis     a. s/p AVR 11/29/11  . HTN (hypertension)   . HLD (hyperlipidemia)   . Gout   . Chronic diastolic heart failure (HCC)     a. Echo 08/11/2015 LVEF 55-60% with normal RV function  . DM2 (diabetes mellitus, type 2) (HCC)   . OSA on CPAP   . Hepatitis   . Arthritis   . History of acute renal failure     Past Surgical History: Past Surgical History  Procedure Laterality Date  . Lumbar laminectomy   10/30/1983  . Aortic valve replacement  11/29/2011    Procedure: AORTIC VALVE REPLACEMENT (AVR);  Surgeon: [name], MD;  Location: [facility] OR;  Service: Open Heart Surgery;  Laterality: N/A;  with nitric oxide  . Right heart catheterization N/A 11/28/2011    Procedure: RIGHT HEART CATH;  Surgeon: [name], MD;  Location: [facility] CATH LAB;  Service: Cardiovascular;  Laterality: N/A;  . Cardiac valve replacement    . Incision and drainage abscess Left 08/2005    "upper/inner thigh; that's where the MRSA was"  .  Back surgery    . Cardiac catheterization    . Cardiac catheterization N/A 10/14/2015    Procedure: Right Heart Cath;  Surgeon: [name], MD;  Location: [facility] INVASIVE CV LAB;  Service: Cardiovascular;  Laterality: N/A;    Family History: Family History  Problem Relation Age of Onset  . Heart attack Father   . Cancer Mother     Social History: Social History   Social History  . Marital Status: Divorced    Spouse Name: N/A  . Number of Children: N/A  . Years of Education: N/A   Occupational History  . retired travelling Engineer, site    Social History Main Topics  . Smoking status: Never Smoker   . Smokeless tobacco: Never Used  . Alcohol Use: Yes     Comment: 02/18/2015 "once or twice/yr I'll have a cocktail"      rare  . Drug Use: No  . Sexual Activity: Not Currently   Other Topics Concern  . None   Social History Narrative    Allergies:  Allergies  Allergen Reactions  . Other Other (See Comments)    Salt causes "extreme" edema.    Objective:    Vital Signs:   Temp:  [97.4 F (36.3 C)-98.2 F (36.8 C)] 98.2 F (36.8 C) (04/28 1203) Pulse Rate:  [85-96] 85 (04/28 1203) Resp:  [13-19] 13 (04/28 1203) BP: (94-126)/(47-82) 112/65 mmHg (04/28 1203) SpO2:  [91 %-100 %] 97 % (04/28 1203) Weight:  [381 lb (172.82 kg)] 381 lb (172.82 kg) (04/27 1950) Last BM Date: 02/23/16  Weight change: Filed Weights   02/22/16 2358 02/23/16 1950    Weight: 368 lb (166.924 kg) 381 lb (172.82 kg)    Intake/Output:   Intake/Output Summary (Last 24 hours) at 02/24/16 1527 Last data filed at 02/24/16 0935  Gross per 24 hour  Intake    608 ml  Output   1100 ml  Net   -492 ml     Physical Exam: General:  Well appearing. No resp difficulty HEENT: normal Neck: supple. JVP difficult to assess . Carotids 2+ bilat; no bruits. No lymphadenopathy or thyromegaly appreciated. Cor: PMI nondisplaced. Regular rate & rhythm. No rubs, gallops or murmurs. Lungs: clear Abdomen: Morbidly obese, soft, NT, ND, No hepatosplenomegaly. No bruits or masses. Good bowel sounds. Extremities: no cyanosis, clubbing, rash. Trace - 1+ edema Neuro: alert & orientedx3, cranial nerves grossly intact. moves all 4 extremities w/o difficulty. Affect pleasant  Telemetry: NSR  Labs: Basic Metabolic Panel:  Recent Labs Lab 02/23/16 0023 02/23/16 0720 02/24/16 0347 02/24/16 1315  NA 136  --  132* 135  K 3.3*  --  4.0 3.8  CL 80*  --  83* 85*  CO2 43*  --  34* 37*  GLUCOSE 132*  --  355* 247*  BUN 85*  --  105* 96*  CREATININE 2.33* 2.80* 3.65* 2.61*  CALCIUM 9.1  --  8.3* 8.6*    Liver Function Tests:  Recent Labs Lab 02/23/16 0023  AST 18  ALT 15*  ALKPHOS 76  BILITOT 0.4  PROT 7.1  ALBUMIN 3.5    Recent Labs Lab 02/23/16 0023  LIPASE 47   No results for input(s): AMMONIA in the last 168 hours.  CBC:  Recent Labs Lab 02/23/16 0023 02/23/16 0720 02/24/16 0347  WBC 17.2* 19.0* 11.2*  NEUTROABS 11.5*  --   --   HGB 14.2 12.9* 11.7*  HCT 43.1 40.6 37.1*  MCV 84.3 85.3 85.7  PLT  319 306 245    Cardiac Enzymes:  Recent Labs Lab 02/23/16 0023  TROPONINI 0.04*    BNP: BNP (last 3 results)  Recent Labs  01/01/16 1108 01/02/16 0652 02/08/16 1513  BNP 196.0* 173.7* 29.9    ProBNP (last 3 results) No results for input(s): PROBNP in the last 8760 hours.   CBG:  Recent Labs Lab 02/23/16 0826 02/23/16 1448  02/23/16 2145 02/24/16 0731 02/24/16 1204  GLUCAP 230* 336* 329* 297* 257*    Coagulation Studies: No results for input(s): LABPROT, INR in the last 72 hours.  Other results: EKG: 02/22/16 NSR 87  Imaging: Dg Chest 2 View  02/23/2016  CLINICAL DATA:  Nausea, vomiting, fluid overload EXAM: CHEST  2 VIEW COMPARISON:  02/23/2016 FINDINGS: Study is limited by patient's large body habitus and poor inspiration. No acute infiltrate or pulmonary edema. Status post median sternotomy. Stable bilateral basilar atelectasis or scarring. No definite superimposed infiltrate or pulmonary edema. IMPRESSION: Limited study by poor inspiration and patient's large body habitus. Status post median sternotomy. Stable bilateral basilar atelectasis or scarring. No definite superimposed infiltrate or pulmonary edema. Electronically Signed   By: Natasha Mead M.D.   On: 02/23/2016 10:16   Dg Abd Acute W/chest  02/23/2016  CLINICAL DATA:  Vomiting EXAM: DG ABDOMEN ACUTE W/ 1V CHEST COMPARISON:  02/08/2016 FINDINGS: Nonobstructive bowel gas pattern. No evidence of pneumoperitoneum or pneumatosis. Low volume chest with streaky basilar opacities attributed atelectasis. No edema, effusion, or pneumothorax. Chronic cardiomegaly. Status post aortic valve replacement. IMPRESSION: 1. Normal bowel gas pattern. 2. Low volume chest with basilar atelectasis. Electronically Signed   By: Marnee Spring M.D.   On: 02/23/2016 00:51      Medications:     Current Medications: . aspirin  81 mg Oral Daily  . atorvastatin  40 mg Oral q1800  . docusate sodium  100 mg Oral BID  . enoxaparin (LOVENOX) injection  80 mg Subcutaneous Q24H  . febuxostat  40 mg Oral Daily  . fenofibrate  54 mg Oral Daily  . fluticasone  1 spray Each Nare Daily  . insulin aspart  0-15 Units Subcutaneous TID WC  . insulin aspart  0-5 Units Subcutaneous QHS  . insulin aspart  5 Units Subcutaneous TID WC  . insulin detemir  25 Units Subcutaneous BID  .  sodium chloride flush  3 mL Intravenous Q12H     Infusions:      Assessment   1. Chronic Diastolic HF 2. AKI on CKD III - Likely due to metolazone  3. Bioprosthetic aortic valve 4. Morbid Obesity 5. OSA 6. DM 7. HTN 8. Gout  Plan    AKI most likely due to taking metolazone daily as opposed to AS NEEDED as directed.  Has some volume on board.  Will restart his torsemide 80 mg BID tomorrow. Can decreased as needed. NO METOLAZONE.  OK to restart colchicine for his gout.   Cannot rule out possibility of sepsis with increased WBC and hypotension. Agree with work up as ordered. Appreciate renal input.   Length of Stay: 1  [name] PA-C 02/24/2016, 3:27 PM  Advanced Heart Failure Team Pager [phone] (M-F; 7a - 4p)  Please contact [facility] for night-coverage after hours (4p -7a ) and weekends on [email or web address]  Patient seen and examined with Otilio Saber, PA-C. We discussed all aspects of the encounter. I agree with the assessment and plan as stated above.   Patient with chronic diastolic HF and multiple admissions for  volume overload and poor compliance. Admitted with AKI in setting of taking metolazone every day by accident. Now improved with hydration. Would restart torsemide 80 po bid. Tomorrow and watch for one more day. If renal function stable on Sunday can go home on torsemide 80 po bid and metolazone only for PRN use and not daily. Reinforced need for daily weights and reviewed use of sliding scale diuretics.  We will sign off. Please call with questions. We will see him back in HF Clinic in near future.   Bensimhon, Daniel,MD 11:23 PM

## 2016-02-24 NOTE — Progress Notes (Signed)
Inpatient Diabetes Program Recommendations  AACE/ADA: New Consensus Statement on Inpatient Glycemic Control (2015)  Target Ranges:  Prepandial:   less than 140 mg/dL      Peak postprandial:   less than 180 mg/dL (1-2 hours)      Critically ill patients:  140 - 180 mg/dL  Results for JEKAI, BARBERI (MRN [id number]) as of 02/24/2016 09:31  Ref. Range 02/24/2016 03:47  Glucose Latest Ref Range: 65-99 mg/dL 073 (H)   Results for CLABON, LURIA (MRN [id number]) as of 02/24/2016 09:31  Ref. Range 02/23/2016 08:26 02/23/2016 14:48 02/23/2016 21:45  Glucose-Capillary Latest Ref Range: 65-99 mg/dL 546 (H) 270 (H) 350 (H)   Review of Glycemic Control  Diabetes history: DM2 Outpatient Diabetes medications: Levemir 30 units QAM, Levemir 35 units QHS, Novolog 30-40 units TID with meals Current orders for Inpatient glycemic control: Levemir 25 units QHS, Novolog 0-15 units TID with meals, Novolog 0-5 units QHS  Inpatient Diabetes Program Recommendations: Insulin - Basal: Please increase Lantus to 25 units BID staring now. Insulin - Meal Coverage: Please consider ordering Novolog 5 units TID with meal for meal coverage.  Thanks, Orlando Penner, RN, MSN, CDE Diabetes Coordinator Inpatient Diabetes Program [phone] (Team Pager from 8am to 5pm) [phone] ([facility] office) [phone] [facility] office) [phone] [facility] office)

## 2016-02-24 NOTE — Care Management Important Message (Signed)
Important Message  Patient Details  Name: Antonio [name] MRN: [id number] Date of Birth: [birth year]   Medicare Important Message Given:  Yes    Cherylann Parr, RN 02/24/2016, 3:39 PM

## 2016-02-24 NOTE — Progress Notes (Signed)
PROGRESS NOTE    Antonio [name]  UJW:[id number] DOB: 14-Jun-1951 DOA: 02/22/2016 PCP: [name], MD  Outpatient Specialists: Cardiologist Dr Shirlee Latch.     Brief Narrative:  Antonio [name] is a 65 y.o. male with h/o hypertension, chronic diastolic heart failure, DM, osa on CPAP, CKD stage 3, admitted for dizziness, lightheadedness, nausea. On arrival to ED he was found to be hypotensive . In ED he received about 4 liters of NS fluid. And his bp improved to 110/60's. He was transferred to step down for further evaluation.   Assessment & Plan:   Active Problems:   Hyperlipidemia   S/P aortic valve replacement with bioprosthetic valve   OSA on CPAP   Insulin dependent diabetes mellitus (HCC)   CKD (chronic kidney disease), stage III   Acute on chronic diastolic HF (heart failure) (HCC)   Hypotension   Nausea and vomiting    dizziness and lightheaded ness and hypotension: possibly from over diuresis from torsemide use. Symptoms improved with IV hydration. Torsemide held   Acute on CKD; ? Hypoperfusion and diuresis.  US renal ordered, UA NEG for infection.  Urine output only in the last 24 hours.  Renal consulted    Diabetes Mellitus: Uncontrolled.  hgba1c pending.  Increase lantus to BID and add novolog SSI.    CHRONIC diastolic heart failure: Does not appear to be decompensated.  Torsemide on hold on admission.  Get daily weights and strict intake and out put.  Last echo in 3/17.   Hyponatremia resolved.   Elevated lactic acid: Probably from dehydration and hypotension.  Normalized with IV fluids.         DVT prophylaxis: (Lovenox/ Code Status: (Full) Family Communication: family at bedside.  Disposition Plan: pending further investigation.   Consultants:   Renal.   Cardiology.   Procedures:   US renal.    Antimicrobials: none   Subjective: No new complaints. Reports dizziness resolved.   Objective: Filed Vitals:   02/24/16  0000 02/24/16 0410 02/24/16 0732 02/24/16 1203  BP:  98/47 104/63 112/65  Pulse:   90 85  Temp: 97.8 F (36.6 C) 97.7 F (36.5 C) 97.5 F (36.4 C) 98.2 F (36.8 C)  TempSrc: Oral Oral Oral Oral  Resp: Height:      Weight:      SpO2: 92% 98% 93% 97%    Intake/Output Summary (Last 24 hours) at 02/24/16 1433 Last data filed at 02/24/16 0935  Gross per 24 hour  Intake    608 ml  Output   1100 ml  Net   -492 ml   Filed Weights   02/22/16 2358 02/23/16 1950  Weight: 166.924 kg (368 lb) 172.82 kg (381 lb)    Examination:  General exam: Appears calm and comfortable on 2 lit [place] oxygen.  Respiratory system: diminished at bases.  Cardiovascular system: S1 & S2 heard,  Trace pedal edema.  Gastrointestinal system: Abdomen is nondistended, soft and nontender. No organomegaly or masses felt. Normal bowel sounds heard. Central nervous system: Alert and oriented. No focal neurological deficits. Extremities: Symmetric 5 x 5 power. Skin: No rashes, lesions or ulcers Psychiatry: Judgement and insight appear normal. Mood & affect appropriate.     Data Reviewed: I have personally reviewed following labs and imaging studies  CBC:  Recent Labs Lab 02/23/16 0023 02/23/16 0720 02/24/16 0347  WBC 17.2* 19.0* 11.2*  NEUTROABS 11.5*  --   --   HGB 14.2 12.9* 11.7*  HCT 43.1 40.6 37.1*  MCV 84.3 85.3 85.7  PLT 319 306 245   Basic Metabolic Panel:  Recent Labs Lab 02/23/16 0023 02/23/16 0720 02/24/16 0347 02/24/16 1315  NA 136  --  132* 135  K 3.3*  --  4.0 3.8  CL 80*  --  83* 85*  CO2 43*  --  34* 37*  GLUCOSE 132*  --  355* 247*  BUN 85*  --  105* 96*  CREATININE 2.33* 2.80* 3.65* 2.61*  CALCIUM 9.1  --  8.3* 8.6*   GFR: Estimated Creatinine Clearance: 45.6 mL/min (by C-G formula based on Cr of 2.61). Liver Function Tests:  Recent Labs Lab 02/23/16 0023  AST 18  ALT 15*  ALKPHOS 76  BILITOT 0.4  PROT 7.1  ALBUMIN 3.5    Recent Labs Lab  02/23/16 0023  LIPASE 47   No results for input(s): AMMONIA in the last 168 hours. Coagulation Profile: No results for input(s): INR, PROTIME in the last 168 hours. Cardiac Enzymes:  Recent Labs Lab 02/23/16 0023  TROPONINI 0.04*   BNP (last 3 results) No results for input(s): PROBNP in the last 8760 hours. HbA1C: No results for input(s): HGBA1C in the last 72 hours. CBG:  Recent Labs Lab 02/23/16 0826 02/23/16 1448 02/23/16 2145 02/24/16 0731 02/24/16 1204  GLUCAP 230* 336* 329* 297* 257*   Lipid Profile: No results for input(s): CHOL, HDL, LDLCALC, TRIG, CHOLHDL, LDLDIRECT in the last 72 hours. Thyroid Function Tests: No results for input(s): TSH, T4TOTAL, FREET4, T3FREE, THYROIDAB in the last 72 hours. Anemia Panel: No results for input(s): VITAMINB12, FOLATE, FERRITIN, TIBC, IRON, RETICCTPCT in the last 72 hours. Urine analysis:    Component Value Date/Time   COLORURINE YELLOW 02/23/2016 0525   APPEARANCEUR TURBID* 02/23/2016 0525   LABSPEC 1.013 02/23/2016 0525   PHURINE 5.5 02/23/2016 0525   GLUCOSEU NEGATIVE 02/23/2016 0525   HGBUR SMALL* 02/23/2016 0525   HGBUR negative 06/12/2007 0843   BILIRUBINUR NEGATIVE 02/23/2016 0525   KETONESUR NEGATIVE 02/23/2016 0525   PROTEINUR 30* 02/23/2016 0525   UROBILINOGEN 0.2 06/15/2015 0029   NITRITE NEGATIVE 02/23/2016 0525   LEUKOCYTESUR TRACE* 02/23/2016 0525   Sepsis Labs: @LABRCNTIP (procalcitonin:4,lacticidven:4)  ) Recent Results (from the past 240 hour(s))  Culture, blood (Routine X 2) w Reflex to ID Panel     Status: None (Preliminary result)   Collection Time: 02/23/16  7:20 AM  Result Value Ref Range Status   Specimen Description BLOOD LEFT ANTECUBITAL  Final   Special Requests BOTTLES DRAWN AEROBIC AND ANAEROBIC 10CC  Final   Culture NO GROWTH 1 DAY  Final   Report Status PENDING  Incomplete  Culture, blood (Routine X 2) w Reflex to ID Panel     Status: None (Preliminary result)   Collection Time:  02/23/16  7:27 AM  Result Value Ref Range Status   Specimen Description BLOOD LEFT HAND  Final   Special Requests IN PEDIATRIC BOTTLE 4CC  Final   Culture NO GROWTH 1 DAY  Final   Report Status PENDING  Incomplete         Radiology Studies: Dg Chest 2 View  02/23/2016  CLINICAL DATA:  Nausea, vomiting, fluid overload EXAM: CHEST  2 VIEW COMPARISON:  02/23/2016 FINDINGS: Study is limited by patient's large body habitus and poor inspiration. No acute infiltrate or pulmonary edema. Status post median sternotomy. Stable bilateral basilar atelectasis or scarring. No definite superimposed infiltrate or pulmonary edema. IMPRESSION: Limited study by poor inspiration and patient's  large body habitus. Status post median sternotomy. Stable bilateral basilar atelectasis or scarring. No definite superimposed infiltrate or pulmonary edema. Electronically Signed   By: Natasha Mead M.D.   On: 02/23/2016 10:16   Dg Abd Acute W/chest  02/23/2016  CLINICAL DATA:  Vomiting EXAM: DG ABDOMEN ACUTE W/ 1V CHEST COMPARISON:  02/08/2016 FINDINGS: Nonobstructive bowel gas pattern. No evidence of pneumoperitoneum or pneumatosis. Low volume chest with streaky basilar opacities attributed atelectasis. No edema, effusion, or pneumothorax. Chronic cardiomegaly. Status post aortic valve replacement. IMPRESSION: 1. Normal bowel gas pattern. 2. Low volume chest with basilar atelectasis. Electronically Signed   By: Marnee Spring M.D.   On: 02/23/2016 00:51        Scheduled Meds: . aspirin  81 mg Oral Daily  . atorvastatin  40 mg Oral q1800  . docusate sodium  100 mg Oral BID  . enoxaparin (LOVENOX) injection  80 mg Subcutaneous Q24H  . febuxostat  40 mg Oral Daily  . fenofibrate  54 mg Oral Daily  . fluticasone  1 spray Each Nare Daily  . insulin aspart  0-15 Units Subcutaneous TID WC  . insulin aspart  0-5 Units Subcutaneous QHS  . insulin aspart  5 Units Subcutaneous TID WC  . insulin detemir  25 Units Subcutaneous  BID  . sodium chloride flush  3 mL Intravenous Q12H   Continuous Infusions:    LOS: 1 day    Time spent: 25 minutes    Maclovio Henson, MD [facility] Pager [phone]  If 7PM-7AM, please contact night-coverage [email or web address] Password [facility] 02/24/2016, 2:33 PM

## 2016-02-25 DIAGNOSIS — I959 Hypotension, unspecified: Secondary | ICD-10-CM

## 2016-02-25 LAB — BASIC METABOLIC PANEL WITH GFR
Anion gap: 11 (ref 5–15)
BUN: 80 mg/dL — ABNORMAL HIGH (ref 6–20)
CO2: 34 mmol/L — ABNORMAL HIGH (ref 22–32)
Calcium: 8.9 mg/dL (ref 8.9–10.3)
Chloride: 92 mmol/L — ABNORMAL LOW (ref 101–111)
Creatinine, Ser: 1.65 mg/dL — ABNORMAL HIGH (ref 0.61–1.24)
GFR calc Af Amer: 49 mL/min — ABNORMAL LOW
GFR calc non Af Amer: 42 mL/min — ABNORMAL LOW
Glucose, Bld: 286 mg/dL — ABNORMAL HIGH (ref 65–99)
Potassium: 4.1 mmol/L (ref 3.5–5.1)
Sodium: 137 mmol/L (ref 135–145)

## 2016-02-25 LAB — CBC
HEMATOCRIT: 38.3 % — AB (ref 39.0–52.0)
HEMOGLOBIN: 11.9 g/dL — AB (ref 13.0–17.0)
MCH: 27.2 pg (ref 26.0–34.0)
MCHC: 31.1 g/dL (ref 30.0–36.0)
MCV: 87.6 fL (ref 78.0–100.0)
Platelets: 239 10*3/uL (ref 150–400)
RBC: 4.37 MIL/uL (ref 4.22–5.81)
RDW: 14.7 % (ref 11.5–15.5)
WBC: 9.5 10*3/uL (ref 4.0–10.5)

## 2016-02-25 LAB — CORTISOL: CORTISOL PLASMA: 5.4 ug/dL

## 2016-02-25 LAB — GLUCOSE, CAPILLARY
GLUCOSE-CAPILLARY: 275 mg/dL — AB (ref 65–99)
GLUCOSE-CAPILLARY: 273 mg/dL — AB (ref 65–99)
GLUCOSE-CAPILLARY: 215 mg/dL — AB (ref 65–99)
Glucose-Capillary: 279 mg/dL — ABNORMAL HIGH (ref 65–99)

## 2016-02-25 LAB — TSH: TSH: 3.386 u[IU]/mL (ref 0.350–4.500)

## 2016-02-25 MED ORDER — INSULIN ASPART 100 UNIT/ML ~~LOC~~ SOLN
8.0000 [IU] | Freq: Three times a day (TID) | SUBCUTANEOUS | Status: DC
  Administered 2016-02-25: 8 [IU] via SUBCUTANEOUS

## 2016-02-25 MED ORDER — INSULIN DETEMIR 100 UNIT/ML ~~LOC~~ SOLN
30.0000 [IU] | Freq: Two times a day (BID) | SUBCUTANEOUS | Status: DC
  Administered 2016-02-25 – 2016-02-26 (×3): 30 [IU] via SUBCUTANEOUS
  Filled 2016-02-25 (×6): qty 0.3

## 2016-02-25 MED ORDER — ENOXAPARIN SODIUM 100 MG/ML ~~LOC~~ SOLN
85.0000 mg | SUBCUTANEOUS | Status: DC
  Administered 2016-02-25 – 2016-02-26 (×2): 85 mg via SUBCUTANEOUS
  Filled 2016-02-25 (×2): qty 1

## 2016-02-25 NOTE — Progress Notes (Signed)
Patient ID: Antonio [name], male   DOB: 1951/02/28, 65 y.o.   MRN: [id number] S:no changes O:BP 137/64 mmHg  Pulse 84  Temp(Src) 97.9 F (36.6 C) (Oral)  Resp 18  Ht  (1.803 m)  Wt 173.6 kg (382 lb 11.5 oz)  BMI 53.40 kg/m2  SpO2 97%  Intake/Output Summary (Last 24 hours) at 02/25/16 0818 Last data filed at 02/25/16 0433  Gross per 24 hour  Intake    823 ml  Output   3075 ml  Net  -2252 ml   Intake/Output: I/O last 3 completed shifts: In: 1428 [P.O.:1175; I.V.:253] Out: 4175 [Urine:4175]  Intake/Output this shift:    Weight change: 0.78 kg (1 lb 11.5 oz) Gen:WD obese WM in NAD CVS:no rub Resp:cta NWG:NFAOZH, obese Ext: trace pretib edema   Recent Labs Lab 02/23/16 0023 02/23/16 0720 02/24/16 0347 02/24/16 1315 02/25/16 0402  NA 136  --  132* 135 137  K 3.3*  --  4.0 3.8 4.1  CL 80*  --  83* 85* 92*  CO2 43*  --  34* 37* 34*  GLUCOSE 132*  --  355* 247* 286*  BUN 85*  --  105* 96* 80*  CREATININE 2.33* 2.80* 3.65* 2.61* 1.65*  ALBUMIN 3.5  --   --   --   --   CALCIUM 9.1  --  8.3* 8.6* 8.9  AST 18  --   --   --   --   ALT 15*  --   --   --   --    Liver Function Tests:  Recent Labs Lab 02/23/16 0023  AST 18  ALT 15*  ALKPHOS 76  BILITOT 0.4  PROT 7.1  ALBUMIN 3.5    Recent Labs Lab 02/23/16 0023  LIPASE 47   No results for input(s): AMMONIA in the last 168 hours. CBC:  Recent Labs Lab 02/23/16 0023 02/23/16 0720 02/24/16 0347 02/25/16 0402  WBC 17.2* 19.0* 11.2* 9.5  NEUTROABS 11.5*  --   --   --   HGB 14.2 12.9* 11.7* 11.9*  HCT 43.1 40.6 37.1* 38.3*  MCV 84.3 85.3 85.7 87.6  PLT 319 306 245 239   Cardiac Enzymes:  Recent Labs Lab 02/23/16 0023  TROPONINI 0.04*   CBG:  Recent Labs Lab 02/24/16 0731 02/24/16 1204 02/24/16 1630 02/24/16 2218 02/25/16 0748  GLUCAP 297* 257* 243* 309* 279*    Iron Studies: No results for input(s): IRON, TIBC, TRANSFERRIN, FERRITIN in the last 72 hours. Studies/Results: Dg  Chest 2 View  02/23/2016  CLINICAL DATA:  Nausea, vomiting, fluid overload EXAM: CHEST  2 VIEW COMPARISON:  02/23/2016 FINDINGS: Study is limited by patient's large body habitus and poor inspiration. No acute infiltrate or pulmonary edema. Status post median sternotomy. Stable bilateral basilar atelectasis or scarring. No definite superimposed infiltrate or pulmonary edema. IMPRESSION: Limited study by poor inspiration and patient's large body habitus. Status post median sternotomy. Stable bilateral basilar atelectasis or scarring. No definite superimposed infiltrate or pulmonary edema. Electronically Signed   By: Natasha Mead M.D.   On: 02/23/2016 10:16   US Renal  02/24/2016  CLINICAL DATA:  Acute renal failure. EXAM: RENAL / URINARY TRACT ULTRASOUND COMPLETE COMPARISON:  Ultrasound of May 13, 2015. FINDINGS: Right Kidney: Length: 11.1 cm. Echogenicity within normal limits. No mass or hydronephrosis visualized. Left Kidney: Length: 11.4 cm. Echogenicity within normal limits. No mass or hydronephrosis visualized. Bladder: Appears normal for degree of bladder distention. IMPRESSION: Normal renal ultrasound. Electronically  Signed   By: Lupita Raider, M.D.   On: 02/24/2016 20:34   . aspirin  81 mg Oral Daily  . atorvastatin  40 mg Oral q1800  . colchicine  0.6 mg Oral Daily  . docusate sodium  100 mg Oral BID  . enoxaparin (LOVENOX) injection  80 mg Subcutaneous Q24H  . febuxostat  40 mg Oral Daily  . fenofibrate  54 mg Oral Daily  . fluticasone  1 spray Each Nare Daily  . insulin aspart  0-15 Units Subcutaneous TID WC  . insulin aspart  0-5 Units Subcutaneous QHS  . insulin aspart  5 Units Subcutaneous TID WC  . insulin detemir  25 Units Subcutaneous BID  . sodium chloride flush  3 mL Intravenous Q12H  . torsemide  80 mg Oral BID    BMET    Component Value Date/Time   NA 137 02/25/2016 0402   K 4.1 02/25/2016 0402   CL 92* 02/25/2016 0402   CO2 34* 02/25/2016 0402   GLUCOSE 286*  02/25/2016 0402   BUN 80* 02/25/2016 0402   CREATININE 1.65* 02/25/2016 0402   CALCIUM 8.9 02/25/2016 0402   GFRNONAA 42* 02/25/2016 0402   GFRAA 49* 02/25/2016 0402   CBC    Component Value Date/Time   WBC 9.5 02/25/2016 0402   RBC 4.37 02/25/2016 0402   HGB 11.9* 02/25/2016 0402   HCT 38.3* 02/25/2016 0402   PLT 239 02/25/2016 0402   MCV 87.6 02/25/2016 0402   MCH 27.2 02/25/2016 0402   MCHC 31.1 02/25/2016 0402   RDW 14.7 02/25/2016 0402   LYMPHSABS 4.3* 02/23/2016 0023   MONOABS 0.9 02/23/2016 0023   EOSABS 0.3 02/23/2016 0023   BASOSABS 0.0 02/23/2016 0023     Assessment/Plan: 1. AKI/CKD in setting of volume depletion and hypotension. Scr already back at baseline but does have evidence of volume excess.  Will need to resume torsemide today and agree with holding metolazone (patient inadvertently took it daily instead of as needed).  He has poor insight and judgement, so this will likely be a recurring issue.  Continue to educate and encourage compliance with diet and fluid intake.  2. Hypotension- presumably due to volume depletion, however he has an elevated WBC at time of admission. Will pan culture to r/o early sepsis. Improved after IVF's (currently 2 liters positive since admission). Currently off of IVF's. Will also check cortisol level in am to r/o adrenal insufficiency and TSH. 3. Chronic diastolic CHF- difficult to manage due to nonadherence to low sodium diet and fluid restriction. Will need lower dose of torsemide at discharge and continued education on weight management at home and f/u with heart failure team.  1. His discharge weight was 169kg on 02/16/16 and admission weight was 167kg. He is now 172.8kg. Need to determine good dry weight for him probably around 168kg. 4. OSA- continue with CPAP. 5. HTN- stable for now. 6. Morbid obesity- he may benefit from bariatric surgery and referral to weight loss clinic. 7. DM type 2- poorly  controlled. 8. Hyponatremia- due to #1-3.  Improving. 9. Disposition- will need to monitor on po torsemide before discharge and stabilization of regimen, CHF, and renal function.  Julien Nordmann Sumit Branham

## 2016-02-25 NOTE — Progress Notes (Signed)
PROGRESS NOTE    Shahin Knierim  NWG:[id number] DOB: [birth year] DOA: 02/22/2016 PCP: [name], MD  Outpatient Specialists: Cardiologist Dr Shirlee Latch.     Brief Narrative:  Antonio [name] is a 65 y.o. male with h/o hypertension, chronic diastolic heart failure, DM, osa on CPAP, CKD stage 3, admitted for dizziness, lightheadedness, nausea. On arrival to ED he was found to be hypotensive . In ED he received about 4 liters of NS fluid. And his bp improved to 110/60's. He was transferred to step down for further evaluation.   Assessment & Plan:   Active Problems:   Hyperlipidemia   S/P aortic valve replacement with bioprosthetic valve   OSA on CPAP   Insulin dependent diabetes mellitus (HCC)   CKD (chronic kidney disease), stage III   Acute on chronic diastolic HF (heart failure) (HCC)   Hypotension   Nausea and vomiting    dizziness and lightheaded ness and hypotension: possibly from over diuresis from torsemide use. Symptoms improved with IV hydration. Torsemide held on admission . Symptoms improved and torsemide resumed .   Acute on CKD; ? Hypoperfusion and diuresis.  US renal ordered and is normal, UA NEG for infection.  Appreciate renal consult and recommendations.  Creatinine back to baseline.     Diabetes Mellitus: Uncontrolled.  hgba1c is 10 Increase lantus to BID and add novolog SSI.  CBG (last 3)   Recent Labs  02/24/16 2218 02/25/16 0748 02/25/16 1236  GLUCAP 309* 279* 275*    Added 5 units of novolog TIDAC, plan to increase lantus to 30 units bid and novolog to 8 units TIDAC.    CHRONIC diastolic heart failure: Does not appear to be decompensated.  Torsemide on hold on admission, resumed yesterday.  Get daily weights and strict intake and out put.  Last echo in 3/17.   Hyponatremia resolved.   Elevated lactic acid: Probably from dehydration and hypotension.  Normalized with IV fluids.         DVT prophylaxis: (Lovenox/ Code Status:  (Full) Family Communication: family at bedside.  Disposition Plan: pending further investigation.   Consultants:   Renal.   Cardiology.   Procedures:   US renal.    Antimicrobials: none   Subjective: No new complaints. Reports dizziness resolved.   Objective: Filed Vitals:   02/25/16 0120 02/25/16 0355 02/25/16 0750 02/25/16 1200  BP:  139/73 137/64 141/57  Pulse: 85 87 84 91  Temp:  97.6 F (36.4 C) 97.9 F (36.6 C) 97.9 F (36.6 C)  TempSrc:  Oral Oral Oral  Resp: Height:      Weight:  173.6 kg (382 lb 11.5 oz)    SpO2: 97% 99% 97% 96%    Intake/Output Summary (Last 24 hours) at 02/25/16 1426 Last data filed at 02/25/16 1358  Gross per 24 hour  Intake    240 ml  Output   5325 ml  Net  -5085 ml   Filed Weights   02/22/16 2358 02/23/16 1950 02/25/16 0355  Weight: 166.924 kg (368 lb) 172.82 kg (381 lb) 173.6 kg (382 lb 11.5 oz)    Examination:  General exam: Appears calm and comfortable on 2 lit [place] oxygen.  Respiratory system: diminished at bases.  Cardiovascular system: S1 & S2 heard,  Trace pedal edema.  Gastrointestinal system: Abdomen is nondistended, soft and nontender. No organomegaly or masses felt. Normal bowel sounds heard. Central nervous system: Alert and oriented. No focal neurological deficits. Extremities: Symmetric 5  x 5 power. Skin: No rashes, lesions or ulcers Psychiatry: Judgement and insight appear normal. Mood & affect appropriate.     Data Reviewed: I have personally reviewed following labs and imaging studies  CBC:  Recent Labs Lab 02/23/16 0023 02/23/16 0720 02/24/16 0347 02/25/16 0402  WBC 17.2* 19.0* 11.2* 9.5  NEUTROABS 11.5*  --   --   --   HGB 14.2 12.9* 11.7* 11.9*  HCT 43.1 40.6 37.1* 38.3*  MCV 84.3 85.3 85.7 87.6  PLT 319 306 245 239   Basic Metabolic Panel:  Recent Labs Lab 02/23/16 0023 02/23/16 0720 02/24/16 0347 02/24/16 1315 02/25/16 0402  NA 136  --  132* 135 137  K 3.3*  --   4.0 3.8 4.1  CL 80*  --  83* 85* 92*  CO2 43*  --  34* 37* 34*  GLUCOSE 132*  --  355* 247* 286*  BUN 85*  --  105* 96* 80*  CREATININE 2.33* 2.80* 3.65* 2.61* 1.65*  CALCIUM 9.1  --  8.3* 8.6* 8.9   GFR: Estimated Creatinine Clearance: 72.3 mL/min (by C-G formula based on Cr of 1.65). Liver Function Tests:  Recent Labs Lab 02/23/16 0023  AST 18  ALT 15*  ALKPHOS 76  BILITOT 0.4  PROT 7.1  ALBUMIN 3.5    Recent Labs Lab 02/23/16 0023  LIPASE 47   No results for input(s): AMMONIA in the last 168 hours. Coagulation Profile: No results for input(s): INR, PROTIME in the last 168 hours. Cardiac Enzymes:  Recent Labs Lab 02/23/16 0023  TROPONINI 0.04*   BNP (last 3 results) No results for input(s): PROBNP in the last 8760 hours. HbA1C: No results for input(s): HGBA1C in the last 72 hours. CBG:  Recent Labs Lab 02/24/16 1204 02/24/16 1630 02/24/16 2218 02/25/16 0748 02/25/16 1236  GLUCAP 257* 243* 309* 279* 275*   Lipid Profile: No results for input(s): CHOL, HDL, LDLCALC, TRIG, CHOLHDL, LDLDIRECT in the last 72 hours. Thyroid Function Tests:  Recent Labs  02/25/16 0402  TSH 3.386   Anemia Panel: No results for input(s): VITAMINB12, FOLATE, FERRITIN, TIBC, IRON, RETICCTPCT in the last 72 hours. Urine analysis:    Component Value Date/Time   COLORURINE YELLOW 02/23/2016 0525   APPEARANCEUR TURBID* 02/23/2016 0525   LABSPEC 1.013 02/23/2016 0525   PHURINE 5.5 02/23/2016 0525   GLUCOSEU NEGATIVE 02/23/2016 0525   HGBUR SMALL* 02/23/2016 0525   HGBUR negative 06/12/2007 0843   BILIRUBINUR NEGATIVE 02/23/2016 0525   KETONESUR NEGATIVE 02/23/2016 0525   PROTEINUR 30* 02/23/2016 0525   UROBILINOGEN 0.2 06/15/2015 0029   NITRITE NEGATIVE 02/23/2016 0525   LEUKOCYTESUR TRACE* 02/23/2016 0525   Sepsis Labs: @LABRCNTIP (procalcitonin:4,lacticidven:4)  ) Recent Results (from the past 240 hour(s))  Culture, blood (Routine X 2) w Reflex to ID Panel      Status: None (Preliminary result)   Collection Time: 02/23/16  7:20 AM  Result Value Ref Range Status   Specimen Description BLOOD LEFT ANTECUBITAL  Final   Special Requests BOTTLES DRAWN AEROBIC AND ANAEROBIC 10CC  Final   Culture NO GROWTH 2 DAYS  Final   Report Status PENDING  Incomplete  Culture, blood (Routine X 2) w Reflex to ID Panel     Status: None (Preliminary result)   Collection Time: 02/23/16  7:27 AM  Result Value Ref Range Status   Specimen Description BLOOD LEFT HAND  Final   Special Requests IN PEDIATRIC BOTTLE 4CC  Final   Culture NO GROWTH 2 DAYS  Final   Report Status PENDING  Incomplete         Radiology Studies: US Renal  02/24/2016  CLINICAL DATA:  Acute renal failure. EXAM: RENAL / URINARY TRACT ULTRASOUND COMPLETE COMPARISON:  Ultrasound of May 13, 2015. FINDINGS: Right Kidney: Length: 11.1 cm. Echogenicity within normal limits. No mass or hydronephrosis visualized. Left Kidney: Length: 11.4 cm. Echogenicity within normal limits. No mass or hydronephrosis visualized. Bladder: Appears normal for degree of bladder distention. IMPRESSION: Normal renal ultrasound. Electronically Signed   By: Lupita Raider, M.D.   On: 02/24/2016 20:34        Scheduled Meds: . aspirin  81 mg Oral Daily  . atorvastatin  40 mg Oral q1800  . colchicine  0.6 mg Oral Daily  . docusate sodium  100 mg Oral BID  . enoxaparin (LOVENOX) injection  85 mg Subcutaneous Q24H  . febuxostat  40 mg Oral Daily  . fenofibrate  54 mg Oral Daily  . fluticasone  1 spray Each Nare Daily  . insulin aspart  0-15 Units Subcutaneous TID WC  . insulin aspart  0-5 Units Subcutaneous QHS  . insulin aspart  5 Units Subcutaneous TID WC  . insulin detemir  25 Units Subcutaneous BID  . sodium chloride flush  3 mL Intravenous Q12H  . torsemide  80 mg Oral BID   Continuous Infusions:    LOS: 2 days    Time spent: 25 minutes    Yulissa Needham, MD [facility] Pager [phone]  If  7PM-7AM, please contact night-coverage [email or web address] Password [email or web address] 02/25/2016, 2:26 PM

## 2016-02-25 NOTE — Progress Notes (Signed)
Placed patient on CPAP for the night at his home setting of 12cm H20.  Patient is tolerating well at this time.

## 2016-02-26 DIAGNOSIS — N179 Acute kidney failure, unspecified: Secondary | ICD-10-CM | POA: Insufficient documentation

## 2016-02-26 LAB — BASIC METABOLIC PANEL
Anion gap: 8 (ref 5–15)
BUN: 58 mg/dL — AB (ref 6–20)
CHLORIDE: 90 mmol/L — AB (ref 101–111)
CO2: 40 mmol/L — ABNORMAL HIGH (ref 22–32)
Calcium: 9.3 mg/dL (ref 8.9–10.3)
Creatinine, Ser: 1.39 mg/dL — ABNORMAL HIGH (ref 0.61–1.24)
GFR calc Af Amer: 60 mL/min — ABNORMAL LOW (ref >60)
GFR calc non Af Amer: 52 mL/min — ABNORMAL LOW (ref >60)
Glucose, Bld: 231 mg/dL — ABNORMAL HIGH (ref 65–99)
POTASSIUM: 3.7 mmol/L (ref 3.5–5.1)
SODIUM: 138 mmol/L (ref 135–145)

## 2016-02-26 LAB — GLUCOSE, CAPILLARY
GLUCOSE-CAPILLARY: 290 mg/dL — AB (ref 65–99)
GLUCOSE-CAPILLARY: 232 mg/dL — AB (ref 65–99)
Glucose-Capillary: 215 mg/dL — ABNORMAL HIGH (ref 65–99)
Glucose-Capillary: 317 mg/dL — ABNORMAL HIGH (ref 65–99)

## 2016-02-26 LAB — CBC
HCT: 40.9 % (ref 39.0–52.0)
HEMOGLOBIN: 12.8 g/dL — AB (ref 13.0–17.0)
MCH: 27.3 pg (ref 26.0–34.0)
MCHC: 31.3 g/dL (ref 30.0–36.0)
MCV: 87.2 fL (ref 78.0–100.0)
Platelets: 271 10*3/uL (ref 150–400)
RBC: 4.69 MIL/uL (ref 4.22–5.81)
RDW: 14.3 % (ref 11.5–15.5)
WBC: 9.3 10*3/uL (ref 4.0–10.5)

## 2016-02-26 LAB — RENAL FUNCTION PANEL
ANION GAP: 9 (ref 5–15)
Albumin: 3.1 g/dL — ABNORMAL LOW (ref 3.5–5.0)
BUN: 58 mg/dL — ABNORMAL HIGH (ref 6–20)
CHLORIDE: 90 mmol/L — AB (ref 101–111)
CO2: 40 mmol/L — AB (ref 22–32)
Calcium: 9.2 mg/dL (ref 8.9–10.3)
Creatinine, Ser: 1.36 mg/dL — ABNORMAL HIGH (ref 0.61–1.24)
GFR calc non Af Amer: 53 mL/min — ABNORMAL LOW (ref >60)
Glucose, Bld: 230 mg/dL — ABNORMAL HIGH (ref 65–99)
Phosphorus: 3.3 mg/dL (ref 2.5–4.6)
Potassium: 3.7 mmol/L (ref 3.5–5.1)
Sodium: 139 mmol/L (ref 135–145)

## 2016-02-26 LAB — MAGNESIUM: Magnesium: 2 mg/dL (ref 1.7–2.4)

## 2016-02-26 MED ORDER — INSULIN ASPART 100 UNIT/ML ~~LOC~~ SOLN
8.0000 [IU] | Freq: Three times a day (TID) | SUBCUTANEOUS | Status: DC
  Administered 2016-02-26 – 2016-02-27 (×6): 8 [IU] via SUBCUTANEOUS

## 2016-02-26 MED ORDER — POTASSIUM CHLORIDE CRYS ER 20 MEQ PO TBCR
20.0000 meq | EXTENDED_RELEASE_TABLET | Freq: Once | ORAL | Status: AC
  Administered 2016-02-26: 20 meq via ORAL
  Filled 2016-02-26: qty 1

## 2016-02-26 MED ORDER — INSULIN ASPART 100 UNIT/ML ~~LOC~~ SOLN
0.0000 [IU] | Freq: Three times a day (TID) | SUBCUTANEOUS | Status: DC
  Administered 2016-02-26: 11 [IU] via SUBCUTANEOUS
  Administered 2016-02-26: 8 [IU] via SUBCUTANEOUS
  Administered 2016-02-26: 5 [IU] via SUBCUTANEOUS
  Administered 2016-02-27: 8 [IU] via SUBCUTANEOUS
  Administered 2016-02-27: 3 [IU] via SUBCUTANEOUS
  Administered 2016-02-27: 11 [IU] via SUBCUTANEOUS

## 2016-02-26 NOTE — Progress Notes (Signed)
Patient ID: Antonio [name], male   DOB: February 19, 1951, 65 y.o.   MRN: [id number] S:feels well no complaints O:BP 154/84 mmHg  Pulse 90  Temp(Src) 98 F (36.7 C) (Oral)  Resp 20  Ht  (1.803 m)  Wt 170.6 kg (376 lb 1.7 oz)  BMI 52.48 kg/m2  SpO2 98%  Intake/Output Summary (Last 24 hours) at 02/26/16 0844 Last data filed at 02/26/16 0825  Gross per 24 hour  Intake    560 ml  Output   5600 ml  Net  -5040 ml   Intake/Output: I/O last 3 completed shifts: In: 240 [P.O.:240] Out: 7075 [Urine:7075]  Intake/Output this shift:  Total I/O In: 320 [P.O.:320] Out: 600 [Urine:600] Weight change: -3 kg (-6 lb 9.8 oz) Gen:WD WN WM in NAD CVS:no rub Resp:CTA Abd: obese, +BS, soft, NT YNW:GNFAOZH pretibial edema   Recent Labs Lab 02/23/16 0023 02/23/16 0720 02/24/16 0347 02/24/16 1315 02/25/16 0402 02/26/16 0722  NA 136  --  132* 135 137 139  138  K 3.3*  --  4.0 3.8 4.1 3.7  3.7  CL 80*  --  83* 85* 92* 90*  90*  CO2 43*  --  34* 37* 34* 40*  40*  GLUCOSE 132*  --  355* 247* 286* 230*  231*  BUN 85*  --  105* 96* 80* 58*  58*  CREATININE 2.33* 2.80* 3.65* 2.61* 1.65* 1.36*  1.39*  ALBUMIN 3.5  --   --   --   --  3.1*  CALCIUM 9.1  --  8.3* 8.6* 8.9 9.2  9.3  PHOS  --   --   --   --   --  3.3  AST 18  --   --   --   --   --   ALT 15*  --   --   --   --   --    Liver Function Tests:  Recent Labs Lab 02/23/16 0023 02/26/16 0722  AST 18  --   ALT 15*  --   ALKPHOS 76  --   BILITOT 0.4  --   PROT 7.1  --   ALBUMIN 3.5 3.1*    Recent Labs Lab 02/23/16 0023  LIPASE 47   No results for input(s): AMMONIA in the last 168 hours. CBC:  Recent Labs Lab 02/23/16 0023 02/23/16 0720 02/24/16 0347 02/25/16 0402 02/26/16 0722  WBC 17.2* 19.0* 11.2* 9.5 9.3  NEUTROABS 11.5*  --   --   --   --   HGB 14.2 12.9* 11.7* 11.9* 12.8*  HCT 43.1 40.6 37.1* 38.3* 40.9  MCV 84.3 85.3 85.7 87.6 87.2  PLT 319 306 245 239 271   Cardiac Enzymes:  Recent Labs Lab  02/23/16 0023  TROPONINI 0.04*   CBG:  Recent Labs Lab 02/25/16 0748 02/25/16 1236 02/25/16 1653 02/25/16 2243 02/26/16 0634  GLUCAP 279* 275* 273* 215* 215*    Iron Studies: No results for input(s): IRON, TIBC, TRANSFERRIN, FERRITIN in the last 72 hours. Studies/Results: US Renal  02/24/2016  CLINICAL DATA:  Acute renal failure. EXAM: RENAL / URINARY TRACT ULTRASOUND COMPLETE COMPARISON:  Ultrasound of May 13, 2015. FINDINGS: Right Kidney: Length: 11.1 cm. Echogenicity within normal limits. No mass or hydronephrosis visualized. Left Kidney: Length: 11.4 cm. Echogenicity within normal limits. No mass or hydronephrosis visualized. Bladder: Appears normal for degree of bladder distention. IMPRESSION: Normal renal ultrasound. Electronically Signed   By: Lupita Raider, M.D.   On: 02/24/2016  20:34   . aspirin  81 mg Oral Daily  . atorvastatin  40 mg Oral q1800  . colchicine  0.6 mg Oral Daily  . docusate sodium  100 mg Oral BID  . enoxaparin (LOVENOX) injection  85 mg Subcutaneous Q24H  . febuxostat  40 mg Oral Daily  . fenofibrate  54 mg Oral Daily  . fluticasone  1 spray Each Nare Daily  . insulin aspart  0-15 Units Subcutaneous TID WC  . insulin aspart  0-5 Units Subcutaneous QHS  . insulin aspart  8 Units Subcutaneous TID WC  . insulin detemir  30 Units Subcutaneous BID  . sodium chloride flush  3 mL Intravenous Q12H  . torsemide  80 mg Oral BID    BMET    Component Value Date/Time   NA 138 02/26/2016 0722   NA 139 02/26/2016 0722   K 3.7 02/26/2016 0722   K 3.7 02/26/2016 0722   CL 90* 02/26/2016 0722   CL 90* 02/26/2016 0722   CO2 40* 02/26/2016 0722   CO2 40* 02/26/2016 0722   GLUCOSE 231* 02/26/2016 0722   GLUCOSE 230* 02/26/2016 0722   BUN 58* 02/26/2016 0722   BUN 58* 02/26/2016 0722   CREATININE 1.39* 02/26/2016 0722   CREATININE 1.36* 02/26/2016 0722   CALCIUM 9.3 02/26/2016 0722   CALCIUM 9.2 02/26/2016 0722   GFRNONAA 52* 02/26/2016 0722   GFRNONAA  53* 02/26/2016 0722   GFRAA 60* 02/26/2016 0722   GFRAA >60 02/26/2016 0722   CBC    Component Value Date/Time   WBC 9.3 02/26/2016 0722   RBC 4.69 02/26/2016 0722   HGB 12.8* 02/26/2016 0722   HCT 40.9 02/26/2016 0722   PLT 271 02/26/2016 0722   MCV 87.2 02/26/2016 0722   MCH 27.3 02/26/2016 0722   MCHC 31.3 02/26/2016 0722   RDW 14.3 02/26/2016 0722   LYMPHSABS 4.3* 02/23/2016 0023   MONOABS 0.9 02/23/2016 0023   EOSABS 0.3 02/23/2016 0023   BASOSABS 0.0 02/23/2016 0023     Assessment/Plan: 1. AKI/CKD in setting of volume depletion and hypotension. Scr already back at baseline but does have evidence of volume excess. Will need to resume torsemide today and agree with holding metolazone (patient inadvertently took it daily instead of as needed). He has poor insight and judgement, so this will likely be a recurring issue. Continue to educate and encourage compliance with diet and fluid intake.  2. Hypotension- presumably due to volume depletion, however he has an elevated WBC at time of admission. Will pan culture to r/o early sepsis. Improved after IVF's (currently 2 liters positive since admission). Currently off of IVF's. Will also check cortisol level in am to r/o adrenal insufficiency and TSH. 3. Chronic diastolic CHF- difficult to manage due to nonadherence to low sodium diet and fluid restriction. Will need lower dose of torsemide at discharge and continued education on weight management at home and f/u with heart failure team.  1. His discharge weight was 169kg on 02/16/16 and admission weight was 167kg. He is now 172.8kg. Need to determine good dry weight for him probably around 168kg. 4. OSA- continue with CPAP. 5. HTN- stable for now. 6. Morbid obesity- he may benefit from bariatric surgery and referral to weight loss clinic. 7. DM type 2- poorly controlled. 8. Hyponatremia- due to #1-3. Improving. 9. Disposition- will need to monitor on po torsemide before  discharge and stabilization of regimen, CHF, and renal function.  Consider decreasing dose to 60mg  bid and follow.  Will sign off as nothing further to add.  No need for outpatient follow up if he maintains compliant with CHF precautions and fluid and salt restriction.  Can be seen on an as needed basis (he lives in [facility] and has some social issues to be addressed).    Julien Nordmann Naomi Fitton

## 2016-02-26 NOTE — Progress Notes (Signed)
PROGRESS NOTE    Antonio [name]  UOH:[id number] DOB: Oct 10, 1951 DOA: 02/22/2016 PCP: [name], MD  Outpatient Specialists: Cardiologist Dr Shirlee Latch.     Brief Narrative:  Antonio [name] is a 65 y.o. male with h/o hypertension, chronic diastolic heart failure, DM, osa on CPAP, CKD stage 3, admitted for dizziness, lightheadedness, nausea. On arrival to ED he was found to be hypotensive . In ED he received about 4 liters of NS fluid. And his bp improved to 110/60's. He was transferred to step down for further evaluation.   Assessment & Plan:   Active Problems:   Hyperlipidemia   S/P aortic valve replacement with bioprosthetic valve   OSA on CPAP   Insulin dependent diabetes mellitus (HCC)   CKD (chronic kidney disease), stage III   Acute on chronic diastolic HF (heart failure) (HCC)   Hypotension   Nausea and vomiting    dizziness and lightheaded ness and hypotension: possibly from over diuresis from torsemide use. Symptoms improved with IV hydration. Torsemide held on admission . Symptoms improved and torsemide resumed .   Acute on CKD; ? Hypoperfusion and diuresis.  US renal ordered and is normal, UA NEG for infection.  Appreciate renal consult and recommendations.  Creatinine back to baseline.     Diabetes Mellitus: Uncontrolled.  hgba1c is 10 Increase lantus to BID and add novolog SSI.  CBG (last 3)   Recent Labs  02/25/16 1653 02/25/16 2243 02/26/16 0634  GLUCAP 273* 215* 215*    Added 5 units of novolog TIDAC, plan to increase lantus to 32 units bid and novolog to 8 units TIDAC.    CHRONIC diastolic heart failure: Does not appear to be decompensated.  Torsemide on hold on admission, resumed  And good urine output.   Get daily weights and strict intake and out put.  Last echo in 3/17.   Hyponatremia resolved.   Elevated lactic acid: Probably from dehydration and hypotension.  Normalized with IV fluids.         DVT prophylaxis:  (Lovenox/ Code Status: (Full) Family Communication: family at bedside.  Disposition Plan: possibly home tomorrow if renal parameters stays good.    Consultants:   Renal.   Cardiology.   Procedures:   US renal.    Antimicrobials: none   Subjective: No new complaints. Reports dizziness resolved.   Objective: Filed Vitals:   02/25/16 1200 02/25/16 1631 02/25/16 2244 02/26/16 0542  BP: 141/57 135/65 138/70 154/84  Pulse: 91 88 89 90  Temp: 97.9 F (36.6 C) 98.3 F (36.8 C) 98.2 F (36.8 C) 98 F (36.7 C)  TempSrc: Oral Oral Oral Oral  Resp: 21 18 20 20   Height:  5\' 11"  (1.803 m)    Weight:    170.6 kg (376 lb 1.7 oz)  SpO2: 96% 98% 94% 98%    Intake/Output Summary (Last 24 hours) at 02/26/16 0910 Last data filed at 02/26/16 0825  Gross per 24 hour  Intake    560 ml  Output   5600 ml  Net  -5040 ml   Filed Weights   02/23/16 1950 02/25/16 0355 02/26/16 0542  Weight: 172.82 kg (381 lb) 173.6 kg (382 lb 11.5 oz) 170.6 kg (376 lb 1.7 oz)    Examination:  General exam: Appears calm and comfortable on 2 lit [place] oxygen.  Respiratory system: diminished at bases.  Cardiovascular system: S1 & S2 heard,  Trace pedal edema.  Gastrointestinal system: Abdomen is nondistended, soft and nontender. No organomegaly or  masses felt. Normal bowel sounds heard. Central nervous system: Alert and oriented. No focal neurological deficits. Extremities: Symmetric 5 x 5 power. Skin: No rashes, lesions or ulcers Psychiatry: Judgement and insight appear normal. Mood & affect appropriate.     Data Reviewed: I have personally reviewed following labs and imaging studies  CBC:  Recent Labs Lab 02/23/16 0023 02/23/16 0720 02/24/16 0347 02/25/16 0402 02/26/16 0722  WBC 17.2* 19.0* 11.2* 9.5 9.3  NEUTROABS 11.5*  --   --   --   --   HGB 14.2 12.9* 11.7* 11.9* 12.8*  HCT 43.1 40.6 37.1* 38.3* 40.9  MCV 84.3 85.3 85.7 87.6 87.2  PLT 319 306 245 239 271   Basic Metabolic  Panel:  Recent Labs Lab 02/23/16 0023 02/23/16 0720 02/24/16 0347 02/24/16 1315 02/25/16 0402 02/26/16 0722  NA 136  --  132* 135 137 139  138  K 3.3*  --  4.0 3.8 4.1 3.7  3.7  CL 80*  --  83* 85* 92* 90*  90*  CO2 43*  --  34* 37* 34* 40*  40*  GLUCOSE 132*  --  355* 247* 286* 230*  231*  BUN 85*  --  105* 96* 80* 58*  58*  CREATININE 2.33* 2.80* 3.65* 2.61* 1.65* 1.36*  1.39*  CALCIUM 9.1  --  8.3* 8.6* 8.9 9.2  9.3  PHOS  --   --   --   --   --  3.3   GFR: Estimated Creatinine Clearance: 85 mL/min (by C-G formula based on Cr of 1.39). Liver Function Tests:  Recent Labs Lab 02/23/16 0023 02/26/16 0722  AST 18  --   ALT 15*  --   ALKPHOS 76  --   BILITOT 0.4  --   PROT 7.1  --   ALBUMIN 3.5 3.1*    Recent Labs Lab 02/23/16 0023  LIPASE 47   No results for input(s): AMMONIA in the last 168 hours. Coagulation Profile: No results for input(s): INR, PROTIME in the last 168 hours. Cardiac Enzymes:  Recent Labs Lab 02/23/16 0023  TROPONINI 0.04*   BNP (last 3 results) No results for input(s): PROBNP in the last 8760 hours. HbA1C: No results for input(s): HGBA1C in the last 72 hours. CBG:  Recent Labs Lab 02/25/16 0748 02/25/16 1236 02/25/16 1653 02/25/16 2243 02/26/16 0634  GLUCAP 279* 275* 273* 215* 215*   Lipid Profile: No results for input(s): CHOL, HDL, LDLCALC, TRIG, CHOLHDL, LDLDIRECT in the last 72 hours. Thyroid Function Tests:  Recent Labs  02/25/16 0402  TSH 3.386   Anemia Panel: No results for input(s): VITAMINB12, FOLATE, FERRITIN, TIBC, IRON, RETICCTPCT in the last 72 hours. Urine analysis:    Component Value Date/Time   COLORURINE YELLOW 02/23/2016 0525   APPEARANCEUR TURBID* 02/23/2016 0525   LABSPEC 1.013 02/23/2016 0525   PHURINE 5.5 02/23/2016 0525   GLUCOSEU NEGATIVE 02/23/2016 0525   HGBUR SMALL* 02/23/2016 0525   HGBUR negative 06/12/2007 0843   BILIRUBINUR NEGATIVE 02/23/2016 0525   KETONESUR NEGATIVE  02/23/2016 0525   PROTEINUR 30* 02/23/2016 0525   UROBILINOGEN 0.2 06/15/2015 0029   NITRITE NEGATIVE 02/23/2016 0525   LEUKOCYTESUR TRACE* 02/23/2016 0525   Sepsis Labs: (procalcitonin:4,lacticidven:4)  ) Recent Results (from the past 240 hour(s))  Culture, blood (Routine X 2) w Reflex to ID Panel     Status: None (Preliminary result)   Collection Time: 02/23/16  7:20 AM  Result Value Ref Range Status   Specimen Description BLOOD LEFT  ANTECUBITAL  Final   Special Requests BOTTLES DRAWN AEROBIC AND ANAEROBIC 10CC  Final   Culture NO GROWTH 2 DAYS  Final   Report Status PENDING  Incomplete  Culture, blood (Routine X 2) w Reflex to ID Panel     Status: None (Preliminary result)   Collection Time: 02/23/16  7:27 AM  Result Value Ref Range Status   Specimen Description BLOOD LEFT HAND  Final   Special Requests IN PEDIATRIC BOTTLE 4CC  Final   Culture NO GROWTH 2 DAYS  Final   Report Status PENDING  Incomplete         Radiology Studies: US Renal  02/24/2016  CLINICAL DATA:  Acute renal failure. EXAM: RENAL / URINARY TRACT ULTRASOUND COMPLETE COMPARISON:  Ultrasound of May 13, 2015. FINDINGS: Right Kidney: Length: 11.1 cm. Echogenicity within normal limits. No mass or hydronephrosis visualized. Left Kidney: Length: 11.4 cm. Echogenicity within normal limits. No mass or hydronephrosis visualized. Bladder: Appears normal for degree of bladder distention. IMPRESSION: Normal renal ultrasound. Electronically Signed   By: Lupita Raider, M.D.   On: 02/24/2016 20:34        Scheduled Meds: . aspirin  81 mg Oral Daily  . atorvastatin  40 mg Oral q1800  . colchicine  0.6 mg Oral Daily  . docusate sodium  100 mg Oral BID  . enoxaparin (LOVENOX) injection  85 mg Subcutaneous Q24H  . febuxostat  40 mg Oral Daily  . fenofibrate  54 mg Oral Daily  . fluticasone  1 spray Each Nare Daily  . insulin aspart  0-15 Units Subcutaneous TID WC  . insulin aspart  0-5 Units Subcutaneous  QHS  . insulin aspart  8 Units Subcutaneous TID WC  . insulin detemir  30 Units Subcutaneous BID  . sodium chloride flush  3 mL Intravenous Q12H  . torsemide  80 mg Oral BID   Continuous Infusions:    LOS: 3 days    Time spent: 25 minutes    Kayo Zion, MD [facility] Pager [phone]  If 7PM-7AM, please contact night-coverage [email or web address] Password [email or web address] 02/26/2016, 9:10 AM

## 2016-02-26 NOTE — Progress Notes (Signed)
Pt having episodes of triplets. Dr Blake Divine aware. Orders recieved

## 2016-02-27 DIAGNOSIS — I5033 Acute on chronic diastolic (congestive) heart failure: Secondary | ICD-10-CM

## 2016-02-27 LAB — BASIC METABOLIC PANEL
ANION GAP: 11 (ref 5–15)
BUN: 55 mg/dL — AB (ref 6–20)
CALCIUM: 9.3 mg/dL (ref 8.9–10.3)
CO2: 39 mmol/L — ABNORMAL HIGH (ref 22–32)
Chloride: 86 mmol/L — ABNORMAL LOW (ref 101–111)
Creatinine, Ser: 1.46 mg/dL — ABNORMAL HIGH (ref 0.61–1.24)
GFR calc Af Amer: 56 mL/min — ABNORMAL LOW (ref >60)
GFR, EST NON AFRICAN AMERICAN: 49 mL/min — AB (ref >60)
GLUCOSE: 355 mg/dL — AB (ref 65–99)
POTASSIUM: 3.8 mmol/L (ref 3.5–5.1)
SODIUM: 136 mmol/L (ref 135–145)

## 2016-02-27 LAB — GLUCOSE, CAPILLARY
GLUCOSE-CAPILLARY: 297 mg/dL — AB (ref 65–99)
Glucose-Capillary: 330 mg/dL — ABNORMAL HIGH (ref 65–99)
Glucose-Capillary: 187 mg/dL — ABNORMAL HIGH (ref 65–99)

## 2016-02-27 MED ORDER — INSULIN DETEMIR 100 UNIT/ML ~~LOC~~ SOLN
35.0000 [IU] | Freq: Two times a day (BID) | SUBCUTANEOUS | Status: DC
  Administered 2016-02-27: 35 [IU] via SUBCUTANEOUS
  Filled 2016-02-27 (×2): qty 0.35

## 2016-02-27 MED ORDER — POTASSIUM CHLORIDE CRYS ER 20 MEQ PO TBCR
40.0000 meq | EXTENDED_RELEASE_TABLET | Freq: Two times a day (BID) | ORAL | Status: DC
  Administered 2016-02-27: 40 meq via ORAL
  Filled 2016-02-27: qty 2

## 2016-02-27 NOTE — Progress Notes (Signed)
Pt has orders to be discharged. Discharge instructions given and pt has no additional questions at this time. Medication regimen reviewed and pt educated. Pt verbalized understanding and has no additional questions. Telemetry box removed. IV removed and site in good condition. Pt stable and waiting for transportation.   Jaqlyn Gruenhagen RN 

## 2016-02-27 NOTE — Care Management Important Message (Signed)
Important Message  Patient Details  Name: Antonio [name] MRN: [id number] Date of Birth: [birth year]   Medicare Important Message Given:  Yes    [name] P [name] 02/27/2016, 1:52 PM

## 2016-02-27 NOTE — Progress Notes (Signed)
Advanced Heart Failure Rounding Note  Referring Physician: Dr Blake Divine Primary Physician: Dr Leonette Most [facility]) Primary Cardiologist: Dr. Shirlee Latch   Reason for Consultation: Chronic diastolic HF/AKI  Subjective:    Presented to [facility] late evening of 02/22/16 into am of 02/23/16 with sudden onset of lightheadedness and dizziness with N/V and weakness. Found to be hypotensive in 60s. UA negative. Given 4L of fluid resuscitation. Creatinine peaked at 3.65 (up from 1.38 on 02/16/16) HF team consulted with concerns for recurrent volume overload with fluid rehydration. Blood Cx NGTD.  Feels fine.  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 for exercise.  Plans on watching diet and fluid intake more carefully.  Gout under control. Fluid back down to baseline.   Out 6.9 L and weight at 371 which is probably a good weight for him.   Objective:   Weight Range: 371 lb 8 oz (168.511 kg) Body mass index is 51.84 kg/(m^2).   Vital Signs:   Temp:  [97.9 F (36.6 C)-98.3 F (36.8 C)] 97.9 F (36.6 C) (05/01 0557) Pulse Rate:  [85-90] 89 (05/01 0557) Resp:  [17-18] 17 (05/01 0557) BP: (102-157)/(54-83) 139/78 mmHg (05/01 0557) SpO2:  [90 %-96 %] 96 % (05/01 0557) Weight:  [371 lb 8 oz (168.511 kg)] 371 lb 8 oz (168.511 kg) (05/01 0557) Last BM Date: 02/24/16  Weight change: Filed Weights   02/25/16 0355 02/26/16 0542 02/27/16 0557  Weight: 382 lb 11.5 oz (173.6 kg) 376 lb 1.7 oz (170.6 kg) 371 lb 8 oz (168.511 kg)    Intake/Output:   Intake/Output Summary (Last 24 hours) at 02/27/16 0737 Last data filed at 02/27/16 0616  Gross per 24 hour  Intake   1260 ml  Output   3925 ml  Net  -2665 ml     Physical Exam: General: Well appearing. No resp difficulty HEENT: normal Neck: supple. JVP difficult to assess . Carotids 2+ bilat; no bruits. No thyromegaly or nodule noted Cor: PMI nondisplaced. Regular rate & rhythm. No rubs, gallops or  murmurs. Lungs: CTAB, normal effort Abdomen: Morbidly obese, soft, non-tender, non-distended, No hepatosplenomegaly. No bruits or masses. Good bowel sounds. Extremities: no cyanosis, clubbing, rash. Trace ankle edema. Chronic venous stasis changes R>L Neuro: alert & orientedx3, cranial nerves grossly intact. moves all 4 extremities w/o difficulty. Affect pleasant   Telemetry: Reviewed personally, NSR 80s  Labs: CBC  Recent Labs  02/25/16 0402 02/26/16 0722  WBC 9.5 9.3  HGB 11.9* 12.8*  HCT 38.3* 40.9  MCV 87.6 87.2  PLT 239 271   Basic Metabolic Panel  Recent Labs  02/26/16 0722 02/26/16 1114 02/27/16 0222  NA 139  138  --  136  K 3.7  3.7  --  3.8  CL 90*  90*  --  86*  CO2 40*  40*  --  39*  GLUCOSE 230*  231*  --  355*  BUN 58*  58*  --  55*  CREATININE 1.36*  1.39*  --  1.46*  CALCIUM 9.2  9.3  --  9.3  MG  --  2.0  --   PHOS 3.3  --   --    Liver Function Tests  Recent Labs  02/26/16 0722  ALBUMIN 3.1*   No results for input(s): LIPASE, AMYLASE in the last 72 hours. Cardiac Enzymes No results for input(s): CKTOTAL, CKMB, CKMBINDEX, TROPONINI in the last 72 hours.  BNP: BNP (last 3 results)  Recent Labs  01/01/16 1108 01/02/16 0652 02/08/16 1513  BNP 196.0* 173.7* 29.9    ProBNP (last 3 results) No results for input(s): PROBNP in the last 8760 hours.   D-Dimer No results for input(s): DDIMER in the last 72 hours. Hemoglobin A1C No results for input(s): HGBA1C in the last 72 hours. Fasting Lipid Panel No results for input(s): CHOL, HDL, LDLCALC, TRIG, CHOLHDL, LDLDIRECT in the last 72 hours. Thyroid Function Tests  Recent Labs  02/25/16 0402  TSH 3.386    Other results:     Imaging/Studies:   No results found.  Latest Echo  Latest Cath   Medications:     Scheduled Medications: . aspirin  81 mg Oral Daily  . atorvastatin  40 mg Oral q1800  . colchicine  0.6 mg Oral Daily  . docusate sodium  100 mg Oral  BID  . enoxaparin (LOVENOX) injection  85 mg Subcutaneous Q24H  . febuxostat  40 mg Oral Daily  . fenofibrate  54 mg Oral Daily  . fluticasone  1 spray Each Nare Daily  . insulin aspart  0-15 Units Subcutaneous TID WC  . insulin aspart  0-5 Units Subcutaneous QHS  . insulin aspart  8 Units Subcutaneous TID WC  . insulin detemir  30 Units Subcutaneous BID  . sodium chloride flush  3 mL Intravenous Q12H  . torsemide  80 mg Oral BID     Infusions:     PRN Medications:  acetaminophen **OR** acetaminophen, HYDROcodone-acetaminophen, hydrOXYzine, ondansetron **OR** ondansetron (ZOFRAN) IV   Assessment/Plan   Presented to [facility] late evening of 02/22/16 into am of 02/23/16 with sudden onset of lightheadedness and dizziness with N/V and weakness. Found to be hypotensive in 60s. UA negative. Given 4L of fluid resuscitation. Creatinine peaked at 3.65 (up from 1.38 on 02/16/16) HF team consulted with concerns for recurrent volume overload with fluid rehydration. Blood Cx NGTD.  1. AKI on CKD III - Likely due to metolazone - Cr as high as 3.65 this admission. Baseline 1.2-1.4 - NO metolazone. Will manage his torsemide. 2. Acute on Chronic Diastolic HF - In setting of fluid resuscitation for #1.  - Now back at baseline weight. Creatinine up very slightly overnight. - Continue torsemide 80 mg BID for now.  May consider decreasing to 60, but think this is OK dose for him WITHOUT metolazone.  - Went over sliding scale diuretics.  Can hold evening dose of torsemide if weight is dropping with any lightheadedness/dizziness.  Pt has very poor insight into his disease state despite frequent re-education. He is at high risk for re-admission.  - Supp K.  3. Bioprosthetic aortic valve - Stable on previous echo. 4. Morbid Obesity - Has mentioned bariatric surgery.  He would need extensive counseling in order not to fail this with limited insight and foresight.  5. OSA - Continue CPAP 6. DM - Sugars  have been elevated this admission. Meds likely need adjustment and he will need follow up with PCP/Endo 7. Gout - Continue current meds.  Will scheduled for HF appt next week. OK to go from our standpoint.   Length of Stay: 4   [name] PA-C 02/27/2016, 7:37 AM  Advanced Heart Failure Team Pager [phone] (M-F; 7a - 4p)  Please contact [facility] for night-coverage after hours (4p -7a ) and weekends on [email or web address]  Patient seen with PA, agree with the above note.  Cause of AKI likely is that he was taking daily metolazone (was only supposed to  be using prn, as directed by MD).  He appears well-compensated on exam. Creatinine is 1.4 today.  He can go home on torsemide 80 mg po bid.  He should NOT take metolazone.  He needs to make sure it is out of his pillbox.  He will need close followup.   Patient asks about lap-banding for obesity.  I think this would be reasonable and encouraged him to pursue it as an outpatient.   [name] 02/27/2016 12:03 PM

## 2016-02-27 NOTE — Progress Notes (Signed)
Pt stated he would call when ready for cpap. 

## 2016-02-27 NOTE — Progress Notes (Signed)
Dareen Piano with [facility] called to resume services for HHRN/ PT/ OT. Alexis Goodell [phone]

## 2016-02-27 NOTE — Progress Notes (Signed)
Patient familiar to me from HF Clinic and previous admissions.  I have referred him to the [facility] due to ongoing high risk for readmission.  He is aware and agreeable.

## 2016-02-27 NOTE — Discharge Summary (Signed)
Physician Discharge Summary  Antonio [name] ZOX:[id number] DOB: 1951/03/04 DOA: 02/22/2016  PCP: [name], MD  Admit date: 02/22/2016 Discharge date: 02/27/2016  Time spent: 30 minutes  Recommendations for Outpatient Follow-up:  1. Follow up with PCP in one week.    Discharge Diagnoses:  Active Problems:   Hyperlipidemia   S/P aortic valve replacement with bioprosthetic valve   OSA on CPAP   Insulin dependent diabetes mellitus (HCC)   CKD (chronic kidney disease), stage III   Acute on chronic diastolic HF (heart failure) (HCC)   Hypotension   Nausea and vomiting   AKI (acute kidney injury) (HCC)   Discharge Condition: improved  Diet recommendation: low sodium/ carb modified diet.   Filed Weights   02/25/16 0355 02/26/16 0542 02/27/16 0557  Weight: 173.6 kg (382 lb 11.5 oz) 170.6 kg (376 lb 1.7 oz) 168.511 kg (371 lb 8 oz)    History of present illness:  Antonio [name] is a 65 y.o. male with h/o hypertension, chronic diastolic heart failure, DM, osa on CPAP, CKD stage 3, admitted for dizziness, lightheadedness, nausea. On arrival to ED he was found to be hypotensive . In ED he received about 4 liters of NS fluid. And his bp improved to 110/60's. He was transferred to step down for further evaluation.   Hospital Course:  dizziness and lightheaded ness and hypotension: possibly from over diuresis from torsemide use. Symptoms improved with IV hydration. Torsemide held on admission . Symptoms improved and torsemide resumed .   Acute on CKD; ? Hypoperfusion and diuresis.  US renal ordered and is normal, UA NEG for infection.  Appreciate renal consult and recommendations.  Creatinine back to baseline.     Diabetes Mellitus: Uncontrolled.  hgba1c is 10 Increased lantus to BID and add novolog SSI.  CBG (last 3)   Recent Labs (last 2 labs)      Recent Labs  02/25/16 1653 02/25/16 2243 02/26/16 0634  GLUCAP 273* 215* 215*         CHRONIC  diastolic heart failure: Does not appear to be decompensated.  Torsemide on hold on admission, resumed And good urine output.  Get daily weights and strict intake and out put.  Last echo in 3/17.   Hyponatremia resolved.   Elevated lactic acid: Probably from dehydration and hypotension.  Normalized with IV fluids.            Procedures:  none  Consultations:  Renal   Cardiology.  Discharge Exam: Filed Vitals:   02/26/16 1955 02/27/16 0557  BP: 102/54 139/78  Pulse: 90 89  Temp: 98.1 F (36.7 C) 97.9 F (36.6 C)  Resp: 18 17    General: alert comfortable.  Cardiovascular: s1s2 Respiratory: ctab  Discharge Instructions   Discharge Instructions    (HEART FAILURE PATIENTS) Call MD:  Anytime you have any of the following symptoms: 1) 3 pound weight gain in 24 hours or 5 pounds in 1 week 2) shortness of breath, with or without a dry hacking cough 3) swelling in the hands, feet or stomach 4) if you have to sleep on extra pillows at night in order to breathe.    Complete by:  As directed      Call MD for:  difficulty breathing, headache or visual disturbances    Complete by:  As directed      Call MD for:  extreme fatigue    Complete by:  As directed      Call MD for:  persistant dizziness or light-headedness    Complete by:  As directed      Diet - low sodium heart healthy    Complete by:  As directed      Discharge instructions    Complete by:  As directed   Follow up with heart failure clinic as recommended.          Current Discharge Medication List    CONTINUE these medications which have NOT CHANGED   Details  aspirin 81 MG tablet Take 81 mg by mouth daily.    atorvastatin (LIPITOR) 40 MG tablet Take 40 mg by mouth daily at 6 PM.     colchicine 0.6 MG tablet Take 0.6 mg by mouth daily.     febuxostat (ULORIC) 40 MG tablet Take 40 mg by mouth daily.     fenofibrate 54 MG tablet Take 54 mg by mouth daily. Refills: 0    fluticasone  (FLONASE) 50 MCG/ACT nasal spray Place 1 spray into both nostrils daily. Qty: 16 g, Refills: 2    hydrocerin (EUCERIN) CREA Apply 1 application topically 2 (two) times daily. Qty: 113 g, Refills: 0    HYDROcodone-acetaminophen (NORCO) 7.5-325 MG tablet Take 1 tablet by mouth every 4 (four) hours as needed for moderate pain. Qty: 10 tablet, Refills: 0    hydrOXYzine (ATARAX/VISTARIL) 25 MG tablet Take 25 mg by mouth every 6 (six) hours as needed for itching.    insulin detemir (LEVEMIR) 100 UNIT/ML injection 25units qam and 30 units qhs Qty: 10 mL, Refills: 11    lidocaine (LINDAMANTLE) 3 % CREA cream Apply 1 application topically 3 (three) times daily as needed (rash).    NOVOLOG 100 UNIT/ML injection Inject 30-40 Units into the skin 3 (three) times daily with meals. Sliding scale Refills: 0    OXYGEN Inhale 4 L into the lungs at bedtime.    potassium chloride SA (K-DUR,KLOR-CON) 20 MEQ tablet Take 2 tablets (40 mEq total) by mouth daily. Take 3 tabs in AM and 2 tabs in PM Qty: 130 tablet, Refills: 6    sodium chloride (OCEAN) 0.65 % SOLN nasal spray Place 1 spray into both nostrils as needed for congestion. Qty: 1 Bottle, Refills: 0    torsemide (DEMADEX) 20 MG tablet Take 4 tablets (80 mg total) by mouth 2 (two) times daily. Qty: 240 tablet, Refills: 0       Allergies  Allergen Reactions  . Other Other (See Comments)    Salt causes "extreme" edema.   Follow-up Information    Follow up with [facility] On 03/06/2016.   Specialty:  Cardiology   Why:  at 0920 for post hospital follow up. Please bring all of your medications to your visit. The code for parking will be 0002.   Contact information:   [address] [phone] mc [place] [address] [phone]      Follow up with [name], MD. Go on 03/06/2016.   Specialty:  Family Medicine   Why:  :00am with Armed forces technical officer information:   [address] [place] [address] [phone]       Follow up with [facility].   Specialty:  Home Health Services   Why:  They will continue to do your home health care at your home   Contact information:   [address] [place] [address] [phone]        The results  of significant diagnostics from this hospitalization (including imaging, microbiology, ancillary and laboratory) are listed below for reference.    Significant Diagnostic Studies: Dg Chest 2 View  02/23/2016  CLINICAL DATA:  Nausea, vomiting, fluid overload EXAM: CHEST  2 VIEW COMPARISON:  02/23/2016 FINDINGS: Study is limited by patient's large body habitus and poor inspiration. No acute infiltrate or pulmonary edema. Status post median sternotomy. Stable bilateral basilar atelectasis or scarring. No definite superimposed infiltrate or pulmonary edema. IMPRESSION: Limited study by poor inspiration and patient's large body habitus. Status post median sternotomy. Stable bilateral basilar atelectasis or scarring. No definite superimposed infiltrate or pulmonary edema. Electronically Signed   By: Natasha Mead M.D.   On: 02/23/2016 10:16   US Renal  02/24/2016  CLINICAL DATA:  Acute renal failure. EXAM: RENAL / URINARY TRACT ULTRASOUND COMPLETE COMPARISON:  Ultrasound of May 13, 2015. FINDINGS: Right Kidney: Length: 11.1 cm. Echogenicity within normal limits. No mass or hydronephrosis visualized. Left Kidney: Length: 11.4 cm. Echogenicity within normal limits. No mass or hydronephrosis visualized. Bladder: Appears normal for degree of bladder distention. IMPRESSION: Normal renal ultrasound. Electronically Signed   By: Lupita Raider, M.D.   On: 02/24/2016 20:34   Dg Chest Portable 1 View  02/08/2016  CLINICAL DATA:  Low blood pressure on routine office visit. History of renal failure. EXAM: PORTABLE CHEST 1 VIEW COMPARISON:  01/02/2016. FINDINGS: Low lung volumes. Cardiomegaly. Prior  CABG. Bibasilar atelectasis without focal infiltrates or overt failure. Mild vascular congestion. No pneumothorax. Small BILATERAL effusions. IMPRESSION: Low lung volumes with cardiomegaly, bibasilar atelectasis, and mild vascular congestion. No overt failure. Similar appearance to priors. Electronically Signed   By: Elsie Stain M.D.   On: 02/08/2016 15:06   Dg Abd Acute W/chest  02/23/2016  CLINICAL DATA:  Vomiting EXAM: DG ABDOMEN ACUTE W/ 1V CHEST COMPARISON:  02/08/2016 FINDINGS: Nonobstructive bowel gas pattern. No evidence of pneumoperitoneum or pneumatosis. Low volume chest with streaky basilar opacities attributed atelectasis. No edema, effusion, or pneumothorax. Chronic cardiomegaly. Status post aortic valve replacement. IMPRESSION: 1. Normal bowel gas pattern. 2. Low volume chest with basilar atelectasis. Electronically Signed   By: Marnee Spring M.D.   On: 02/23/2016 00:51    Microbiology: Recent Results (from the past 240 hour(s))  Culture, blood (Routine X 2) w Reflex to ID Panel     Status: None (Preliminary result)   Collection Time: 02/23/16  7:20 AM  Result Value Ref Range Status   Specimen Description BLOOD LEFT ANTECUBITAL  Final   Special Requests BOTTLES DRAWN AEROBIC AND ANAEROBIC 10CC  Final   Culture NO GROWTH 4 DAYS  Final   Report Status PENDING  Incomplete  Culture, blood (Routine X 2) w Reflex to ID Panel     Status: None (Preliminary result)   Collection Time: 02/23/16  7:27 AM  Result Value Ref Range Status   Specimen Description BLOOD LEFT HAND  Final   Special Requests IN PEDIATRIC BOTTLE 4CC  Final   Culture NO GROWTH 4 DAYS  Final   Report Status PENDING  Incomplete     Labs: Basic Metabolic Panel:  Recent Labs Lab 02/24/16 0347 02/24/16 1315 02/25/16 0402 02/26/16 0722 02/26/16 1114 02/27/16 0222  NA 132* 135 137 139  138  --  136  K 4.0 3.8 4.1 3.7  3.7  --  3.8  CL 83* 85* 92* 90*  90*  --  86*  CO2 34* 37* 34* 40*  40*  --  39*   GLUCOSE  355* 247* 286* 230*  231*  --  355*  BUN 105* 96* 80* 58*  58*  --  55*  CREATININE 3.65* 2.61* 1.65* 1.36*  1.39*  --  1.46*  CALCIUM 8.3* 8.6* 8.9 9.2  9.3  --  9.3  MG  --   --   --   --  2.0  --   PHOS  --   --   --  3.3  --   --    Liver Function Tests:  Recent Labs Lab 02/23/16 0023 02/26/16 0722  AST 18  --   ALT 15*  --   ALKPHOS 76  --   BILITOT 0.4  --   PROT 7.1  --   ALBUMIN 3.5 3.1*    Recent Labs Lab 02/23/16 0023  LIPASE 47   No results for input(s): AMMONIA in the last 168 hours. CBC:  Recent Labs Lab 02/23/16 0023 02/23/16 0720 02/24/16 0347 02/25/16 0402 02/26/16 0722  WBC 17.2* 19.0* 11.2* 9.5 9.3  NEUTROABS 11.5*  --   --   --   --   HGB 14.2 12.9* 11.7* 11.9* 12.8*  HCT 43.1 40.6 37.1* 38.3* 40.9  MCV 84.3 85.3 85.7 87.6 87.2  PLT 319 306 245 239 271   Cardiac Enzymes:  Recent Labs Lab 02/23/16 0023  TROPONINI 0.04*   BNP: BNP (last 3 results)  Recent Labs  01/01/16 1108 01/02/16 0652 02/08/16 1513  BNP 196.0* 173.7* 29.9    ProBNP (last 3 results) No results for input(s): PROBNP in the last 8760 hours.  CBG:  Recent Labs Lab 02/26/16 1633 02/26/16 2143 02/27/16 0614 02/27/16 1058 02/27/16 1603  GLUCAP 290* 232* 297* 330* 187*       Signed:  Evelia Waskey MD.  [facility] 02/27/2016, 6:11 PM

## 2016-02-28 ENCOUNTER — Encounter (HOSPITAL_COMMUNITY): Payer: Medicare Other

## 2016-02-28 LAB — CULTURE, BLOOD (ROUTINE X 2)
Culture: NO GROWTH
Culture: NO GROWTH

## 2016-03-06 ENCOUNTER — Ambulatory Visit (HOSPITAL_COMMUNITY)
Admit: 2016-03-06 | Discharge: 2016-03-06 | Disposition: A | Discharge status: Home / Self Care | Payer: Medicare Other | Source: Ambulatory Visit | Attending: Cardiology | Admitting: Cardiology

## 2016-03-06 VITALS — BP 124/82 | HR 98 | Wt 375.4 lb

## 2016-03-06 DIAGNOSIS — E1122 Type 2 diabetes mellitus with diabetic chronic kidney disease: Secondary | ICD-10-CM | POA: Insufficient documentation

## 2016-03-06 DIAGNOSIS — Z79899 Other long term (current) drug therapy: Secondary | ICD-10-CM | POA: Insufficient documentation

## 2016-03-06 DIAGNOSIS — E662 Morbid (severe) obesity with alveolar hypoventilation: Secondary | ICD-10-CM

## 2016-03-06 DIAGNOSIS — I5032 Chronic diastolic (congestive) heart failure: Secondary | ICD-10-CM | POA: Insufficient documentation

## 2016-03-06 DIAGNOSIS — N183 Chronic kidney disease, stage 3 (moderate): Secondary | ICD-10-CM | POA: Diagnosis not present

## 2016-03-06 DIAGNOSIS — G4730 Sleep apnea, unspecified: Secondary | ICD-10-CM

## 2016-03-06 DIAGNOSIS — M109 Gout, unspecified: Secondary | ICD-10-CM | POA: Diagnosis not present

## 2016-03-06 DIAGNOSIS — J9610 Chronic respiratory failure, unspecified whether with hypoxia or hypercapnia: Secondary | ICD-10-CM | POA: Insufficient documentation

## 2016-03-06 DIAGNOSIS — Z8249 Family history of ischemic heart disease and other diseases of the circulatory system: Secondary | ICD-10-CM | POA: Insufficient documentation

## 2016-03-06 DIAGNOSIS — Z954 Presence of other heart-valve replacement: Secondary | ICD-10-CM

## 2016-03-06 DIAGNOSIS — K0889 Other specified disorders of teeth and supporting structures: Secondary | ICD-10-CM | POA: Diagnosis not present

## 2016-03-06 DIAGNOSIS — Z953 Presence of xenogenic heart valve: Secondary | ICD-10-CM

## 2016-03-06 DIAGNOSIS — Z7982 Long term (current) use of aspirin: Secondary | ICD-10-CM | POA: Diagnosis not present

## 2016-03-06 DIAGNOSIS — E039 Hypothyroidism, unspecified: Secondary | ICD-10-CM | POA: Diagnosis not present

## 2016-03-06 DIAGNOSIS — G4733 Obstructive sleep apnea (adult) (pediatric): Secondary | ICD-10-CM | POA: Insufficient documentation

## 2016-03-06 DIAGNOSIS — I5033 Acute on chronic diastolic (congestive) heart failure: Secondary | ICD-10-CM

## 2016-03-06 DIAGNOSIS — Z7984 Long term (current) use of oral hypoglycemic drugs: Secondary | ICD-10-CM | POA: Insufficient documentation

## 2016-03-06 DIAGNOSIS — Z9981 Dependence on supplemental oxygen: Secondary | ICD-10-CM | POA: Diagnosis not present

## 2016-03-06 LAB — BASIC METABOLIC PANEL
ANION GAP: 11 (ref 5–15)
BUN: 50 mg/dL — ABNORMAL HIGH (ref 6–20)
CO2: 40 mmol/L — AB (ref 22–32)
Calcium: 9.4 mg/dL (ref 8.9–10.3)
Chloride: 90 mmol/L — ABNORMAL LOW (ref 101–111)
Creatinine, Ser: 1.35 mg/dL — ABNORMAL HIGH (ref 0.61–1.24)
GFR calc Af Amer: >60 mL/min (ref >60)
GFR calc non Af Amer: 54 mL/min — ABNORMAL LOW (ref >60)
GLUCOSE: 187 mg/dL — AB (ref 65–99)
POTASSIUM: 4.7 mmol/L (ref 3.5–5.1)
Sodium: 141 mmol/L (ref 135–145)

## 2016-03-06 NOTE — Patient Instructions (Signed)
Labs today  Your physician recommends that you schedule a follow-up appointment in: 3 weeks with Dr.McLean  Do the following things EVERYDAY: 1) Weigh yourself in the morning before breakfast. Write it down and keep it in a log. 2) Take your medicines as prescribed 3) Eat low salt foods-Limit salt (sodium) to 2000 mg per day.  4) Stay as active as you can everyday 5) Limit all fluids for the day to less than 2 liters 6)

## 2016-03-06 NOTE — Progress Notes (Signed)
Paramedicine Multidisciplinary Team Update  Antonio [name] is newly enrolled in the [employer] Paramedicine Program through [facility].  The patient presents today in association with an Advanced Heart Failure Clinic Appointment.  Patient living/home environment and social support-- He lives alone--he does have 2 ex wives that provide local support.  His 2 daughters live out of state.  Insurance/ Prescription Coverage--Medicare and BCBS  Does the patient have a scale and weigh each day? Yes- he says that he has trouble steadying himself long enough to get weight.  I have ordered a talking scale for Antonio [name] to assist with getting daily weights. Does the patient follow a low salt diet? At times.  His aware of the need for low sodium diet and admits that he "loves to eat".  I have reinforced a low sodium diet and high sodium foods to avoid.  His daughter is to come in to town tonite and they are going to make a "menu" for low sodium foods to eat at home.   Is patient compliant with medications?  Yes Does the patient have transportation for physician appointments? Yes--He has hired a Sports coach" to assist with household chores and transportation to and from physician appts. Does the patient contact the HF Clinic appropriately with worsening symptoms or weight increases? No.  I have discussed today with Antonio [name] when to contact the clinic related to worsening symptoms and weight increases.  We will continue to emphasize the importance of this matter to keep him well and out of the hospital.  Are there any identified obstacles / challenges for adherence to current treatment plan?  Dietary compliance and HF symptom recognition

## 2016-03-06 NOTE — Addendum Note (Signed)
Encounter addended by: Crissie Figures, RN on: 03/06/2016 11:23 AM<BR>     Documentation filed: Notes Section

## 2016-03-06 NOTE — Progress Notes (Signed)
Advanced Heart Failure Medication Review by a Pharmacist  Does the patient  feel that his/her medications are working for him/her?  yes  Has the patient been experiencing any side effects to the medications prescribed?  no  Does the patient measure his/her own blood pressure or blood glucose at home?  yes   Does the patient have any problems obtaining medications due to transportation or finances?   no  Understanding of regimen: good Understanding of indications: good Potential of compliance: good Patient understands to avoid NSAIDs. Patient understands to avoid decongestants.  Issues to address at subsequent visits: None   Pharmacist comments:  [name] is a pleasant 65 yo M presenting without a medication list but with good recall of his regimen. He reports good compliance with his regimen and has not taken any metolazone since he was recently discharged from the hospital. He does state that he thinks he has a tooth abscess for which he can't get in to see his dentist for a few weeks. Will see about getting him in sooner. He did not have any other medication-related questions or concerns for me at this time.   Tyler Deis. Bonnye Fava, PharmD, BCPS, CPP Clinical Pharmacist Pager: [phone] Phone: [phone] 03/06/2016 9:45 AM      Time with patient: 10 minutes Preparation and documentation time: 2 minutes Total time: 12 minutes

## 2016-03-06 NOTE — Progress Notes (Signed)
Patient ID: Antonio [name], male   DOB: [birth year], 65 y.o.   MRN: [id number]    Advanced Heart Failure Clinic Note   PCP: Dr Jeralene Huff [facility]) HF Cardiology: Dr. Aundra Dubin  65 yo with bioprosthetic AVR, chronic diastolic CHF, CKD and morbid obesity presents for CHF clinic followup.  Last echo in 10/16 showed normal bioprosthetic aortic valve and normal EF.  He was  admitted in 10/16 with volume overload and diuresed.  He was sent home on torsemide 80 qam/40 qpm.  He sees a nephrologist in [facility].   He was admitted again in 11/16 with acute on chronic diastolic CHF and inability to do ADLs at home.  He was diuresed and discharged to a rehab facility.    Admitted 01/01/16 - 01/09/2016 with hypercapneic respiratory failure 2/2 to CPAP non-compliance due to poor tolerance and ? HCAP. Treated with BiPAP and ABX. Volume thought to be stable. Creatinine better than baseline that admission and discharge weight 361 lbs.   Admitted 4/26 - 02/27/16 with dizziness and hypotension. Diuretics held but later restarted. Discharge weight was 371 pounds. AHC was to follow.   Today he returns for post hospital follow up.Complaining of tooth pain and has follow up with dentist.Did not have home medications.  Mild dyspnea with exertion. Denies PND/Orthopnea. Uses oxygen at night. CPAP not working. Tries to follow low salt diet. Weight at 368-370 pounds. [facility] HHRN, [place], HHOT.   Worth 10/14/15 --> Torsemide was increased to 100 mg in am and 80 mg daily.  RA mean 13 RV 37/13 PA 36/14, mean 25 PCWP mean 21 Oxygen saturations: PA 65% AO 98% Cardiac Output (Fick) 6.26  Cardiac Index (Fick) 2.21  Labs (10/16): K 3.7, creatinine 1.76, hgb 11.7  Labs (11/16): K 3.6, creatinine 1.68 Labs (12/16): K 4.5, creatinine 2.28  PMH: 1. Severe aortic stenosis: s/p bioprosthetic AVR in 1/13.  2. Chronic diastolic CHF: Echo (13/24) with EF 55-60%, bioprosthetic AVR with mean gradient 13 mmHg, normal RV size and systolic  function.   3. OSA: Not on CPAP.  4. Type II diabetes 5. Gout  6. CKD 7. Subclinical hypothyroidism 8. LHC (1/12) with normal coronaries. 9. Chronic LBBB 10. Morbid obesity  Family History  Problem Relation Age of Onset  . Heart attack Father   . Cancer Mother    Social History   Social History  . Marital Status: Divorced    Spouse Name: N/A  . Number of Children: N/A  . Years of Education: N/A   Occupational History  . retired travelling Education officer, [employer]    Social History Main Topics  . Smoking status: Never Smoker   . Smokeless tobacco: Never Used  . Alcohol Use: Yes     Comment: 02/18/2015 "once or twice/yr I'll have a cocktail"      rare  . Drug Use: No  . Sexual Activity: Not Currently   Other Topics Concern  . Not on file   Social History Narrative   ROS: All systems reviewed and negative except as per HPI  Current Outpatient Prescriptions  Medication Sig Dispense Refill  . aspirin 81 MG tablet Take 81 mg by mouth daily.    Marland Kitchen atorvastatin (LIPITOR) 40 MG tablet Take 40 mg by mouth daily at 6 PM.     . colchicine 0.6 MG tablet Take 0.6 mg by mouth daily.     . febuxostat (ULORIC) 40 MG tablet Take 40 mg by mouth daily.     . fenofibrate 54  MG tablet Take 54 mg by mouth daily.  0  . fluticasone (FLONASE) 50 MCG/ACT nasal spray Place 1 spray into both nostrils daily. 16 g 2  . hydrocerin (EUCERIN) CREA Apply 1 application topically 2 (two) times daily. 113 g 0  . HYDROcodone-acetaminophen (NORCO) 7.5-325 MG tablet Take 1 tablet by mouth every 4 (four) hours as needed for moderate pain. 10 tablet 0  . hydrOXYzine (ATARAX/VISTARIL) 25 MG tablet Take 25 mg by mouth every 6 (six) hours as needed for itching.    . insulin detemir (LEVEMIR) 100 UNIT/ML injection 25units qam and 30 units qhs (Patient taking differently: Inject 30-35 Units into the skin 2 (two) times daily. 30units qam and 35 units qhs) 10 mL 11  . lidocaine (LINDAMANTLE) 3 % CREA cream Apply 1  application topically 3 (three) times daily as needed (rash).    . NOVOLOG 100 UNIT/ML injection Inject 30-40 Units into the skin 3 (three) times daily with meals. Sliding scale  0  . OXYGEN Inhale 4 L into the lungs at bedtime.    . potassium chloride SA (K-DUR,KLOR-CON) 20 MEQ tablet Take 2 tablets (40 mEq total) by mouth daily. Take 3 tabs in AM and 2 tabs in PM (Patient taking differently: Take 40 mEq by mouth 2 (two) times daily. ) 130 tablet 6  . sodium chloride (OCEAN) 0.65 % SOLN nasal spray Place 1 spray into both nostrils as needed for congestion. 1 Bottle 0  . torsemide (DEMADEX) 20 MG tablet Take 4 tablets (80 mg total) by mouth 2 (two) times daily. 240 tablet 0   No current facility-administered medications for this encounter.   BP 124/82 mmHg  Pulse 98  Wt 375 lb 6.4 oz (170.28 kg)  SpO2 85% General: NAD in wheelchair.  Neck: Thick, JVP difficult but does not appear elevated. no thyromegaly or thyroid nodule.  Lungs: CTAB, normal effort.  CV: Nondisplaced PMI.  Heart regular S1/S2, no S3/S4, 1/6 SEM RUSB.  R and LLE trace  edema.  No carotid bruit.  Normal pedal pulses.  Abdomen: Morbidly obese, soft, NT, ND, no HSM. No bruits or masses. +BS  Skin: Intact  Neurologic: Alert and oriented x 3.  Psych: Normal affect. Extremities: No clubbing or cyanosis. BLE edema.   RLE with significant venous stasis changes.  HEENT: Normal.   Assessment/Plan: 1. Chronic diastolic CHF: Most recent ECHO 08/11/2015 EF 55-60%  NYHA IIIB. Had Luana on 10/14/15 with elevated filling pressures. NYHAIII. Volume status stable. Continue torsemide 80 mg twice a day. Continue current potassium dose.  BMEt today.   - Continue low salt diet and limiting fluid intake to < 2 liters per day.  2. CKD: Stage III.- Stable during recent admission. Recheck BMET today.    3. Bioprosthetic aortic valve: Stable on last echo.  4.. Morbid Obesity: Discussed portion control. Needs to lose weight. He is considering  gastric bypass.  5. OSA:  - Now on chronic 02. CPAP not working. PCP is following.  6. DM- Followed by  Dr Posey Pronto with endocrinology next week.  7. L Tooth pain - He is going to follow up in dentist today.  8. Chronic Respiratory Failure- On 2 liters at home. Did not wear oxygen today. Instructed to wear oxygen.   Today he met with Paramedicine and HF Navigator. Follow up 3 weeks with Dr Aundra Dubin.    Aarian Cleaver NP-C  03/06/2016  Total time spent: 30 minutes, over half of that discussing the above.

## 2016-03-08 ENCOUNTER — Telehealth (HOSPITAL_COMMUNITY): Payer: Self-pay | Admitting: *Deleted

## 2016-03-08 NOTE — Telephone Encounter (Signed)
Pt called to let us know that his HHRN from [facility] told him yesterday that she received a call he was suppose to increase his Torsemide to 100 mg AM and 80 mg PM, and he wasn't aware of this change and wanted to f/u with [place].  Advised per his chart at his appt on Tue he was on 80 mg BID and note says for him to continue that does, there is no mention of increasing.  He states his wt has slightly been increasing this week, he was 371.6 Mon and was 374.4 yesterday, he states he did not weigh today because his breakfast came early and he ate and drank before weighing so then he didn't want it to be inaccurately high so he just didn't weigh.  He states he did take the 100 mg of Torsemide this AM because his feet seem a little more swollen and he feels he is retaining fluid.  Advised pt should continue taking Torsemide 80 mg BID only, if wt continues to increase to call us back, he is agreeable.

## 2016-03-12 ENCOUNTER — Telehealth (HOSPITAL_COMMUNITY): Payer: Self-pay | Admitting: *Deleted

## 2016-03-12 NOTE — Telephone Encounter (Signed)
Is he taking colchicine? He can have prednisone 40 mg po daily x 3 days.

## 2016-03-12 NOTE — Telephone Encounter (Signed)
Pt left a VM requesting steroids be called in for his gout flare, he states his left knee is painful and swollen and he can hardly walk due to gout.  Will send to Dr Shirlee Latch for review

## 2016-03-13 ENCOUNTER — Encounter (HOSPITAL_COMMUNITY): Payer: Medicare Other

## 2016-03-13 MED ORDER — PREDNISONE 20 MG PO TABS: 40.0000 mg | ORAL_TABLET | Freq: Every day | ORAL | Status: DC

## 2016-03-13 NOTE — Telephone Encounter (Signed)
Received VM this AM from pt's [facility] Merril Nagy, RN w/[facility] [phone]) she states pt's wt yesterday was 375 and it is 384 today, pt only has slight edema in R ft and ankle no SOB, pt is c/o pain in knee from gout.  Called and spoke w/pt, he states his wt's are up/down he believes they are not accurate, especially the last couple of days b/c he is having a harder time getting up with his gout.  He states his not SOB and only edema is in R ft, he does not feel like he is retaining any fluid, he is urinated well.  Advised to continue meds and watch salt and fluid intake, keep appt as sch w/us on Fri, rx for prednisone called in, advised if dev SOB or edema to call us back, he is agreeable.  Attempted to call Sheronica Corey back and left her a detailed mess w/above info.

## 2016-03-16 ENCOUNTER — Telehealth (HOSPITAL_COMMUNITY): Payer: Self-pay | Admitting: Cardiology

## 2016-03-16 ENCOUNTER — Encounter (HOSPITAL_COMMUNITY): Payer: Medicare Other

## 2016-03-16 MED ORDER — PREDNISONE 20 MG PO TABS: 40.0000 mg | ORAL_TABLET | Freq: Every day | ORAL | Status: DC

## 2016-03-16 NOTE — Telephone Encounter (Signed)
That would be ok.  Make sure he is taking colchicine.

## 2016-03-16 NOTE — Telephone Encounter (Signed)
Patient called to report he is unable to come to follow up this am due to increased pain and swelling in L knee from gout Patient requested a second round of prednisone x 3 days   Please send to [facility]  Please advise

## 2016-03-22 ENCOUNTER — Telehealth (HOSPITAL_COMMUNITY): Payer: Self-pay | Admitting: *Deleted

## 2016-03-22 NOTE — Telephone Encounter (Signed)
Antonio [name] is out seeing pt and states his wt was 384 lbs today, it was 370 last week per pt,she states he does have increased LE edema, breathing is at baseline.  Pt admits to drinking extra fluid and also has been on 2 rounds of prednisone over past week.  Per Dr Shirlee Latch pt needs to take 100 mg Torsemide for 2 days only then back to his 80 mg bid, katie aware and will advise pt

## 2016-03-27 ENCOUNTER — Telehealth (HOSPITAL_COMMUNITY): Payer: Self-pay | Admitting: *Deleted

## 2016-03-27 MED ORDER — POTASSIUM CHLORIDE CRYS ER 20 MEQ PO TBCR: EXTENDED_RELEASE_TABLET | ORAL | Status: DC

## 2016-03-27 MED ORDER — METOLAZONE 2.5 MG PO TABS: 2.5000 mg | ORAL_TABLET | ORAL | Status: DC

## 2016-03-27 NOTE — Telephone Encounter (Signed)
Mazen Marcin, RN w/[facility] called to let us know pt's wt has continued to increase despite extra Torsemide, pt is at 387 lbs today which is up 18 lbs since he left the hospital.  She states pt reported he did take the Tor 100 mg for 2 days the end of last week but wt only continued to increase.  She reports pt's breathing is at baseline and he only has slight edema in LE biat.  Discussed w/Dr Shirlee Latch, he would like pt to take Metolazone today and tomorrow and then take it every Tue, take an extra 20 meq of KCL when he takes it, shc f/u for Fri.  Herbert Seta is aware and states pt does have Metolazone at home, she will advise him on med changes and f/u appt sch for Fri at 9 am.

## 2016-04-03 ENCOUNTER — Encounter (HOSPITAL_COMMUNITY): Payer: Self-pay

## 2016-04-03 ENCOUNTER — Ambulatory Visit (HOSPITAL_COMMUNITY)
Admission: RE | Admit: 2016-04-03 | Discharge: 2016-04-03 | Disposition: A | Discharge status: Home / Self Care | Payer: Medicare Other | Source: Ambulatory Visit | Attending: Cardiology | Admitting: Cardiology

## 2016-04-03 ENCOUNTER — Telehealth (HOSPITAL_COMMUNITY): Payer: Self-pay

## 2016-04-03 VITALS — BP 128/82 | HR 101 | Wt 384.5 lb

## 2016-04-03 DIAGNOSIS — Z953 Presence of xenogenic heart valve: Secondary | ICD-10-CM | POA: Insufficient documentation

## 2016-04-03 DIAGNOSIS — I5032 Chronic diastolic (congestive) heart failure: Secondary | ICD-10-CM | POA: Insufficient documentation

## 2016-04-03 DIAGNOSIS — Z79899 Other long term (current) drug therapy: Secondary | ICD-10-CM | POA: Diagnosis not present

## 2016-04-03 DIAGNOSIS — E039 Hypothyroidism, unspecified: Secondary | ICD-10-CM | POA: Diagnosis not present

## 2016-04-03 DIAGNOSIS — Z9981 Dependence on supplemental oxygen: Secondary | ICD-10-CM | POA: Diagnosis not present

## 2016-04-03 DIAGNOSIS — Z8249 Family history of ischemic heart disease and other diseases of the circulatory system: Secondary | ICD-10-CM | POA: Diagnosis not present

## 2016-04-03 DIAGNOSIS — E1122 Type 2 diabetes mellitus with diabetic chronic kidney disease: Secondary | ICD-10-CM | POA: Insufficient documentation

## 2016-04-03 DIAGNOSIS — N183 Chronic kidney disease, stage 3 unspecified: Secondary | ICD-10-CM

## 2016-04-03 DIAGNOSIS — E662 Morbid (severe) obesity with alveolar hypoventilation: Secondary | ICD-10-CM | POA: Diagnosis not present

## 2016-04-03 DIAGNOSIS — M109 Gout, unspecified: Secondary | ICD-10-CM | POA: Diagnosis not present

## 2016-04-03 DIAGNOSIS — Z954 Presence of other heart-valve replacement: Secondary | ICD-10-CM | POA: Diagnosis not present

## 2016-04-03 DIAGNOSIS — Z794 Long term (current) use of insulin: Secondary | ICD-10-CM | POA: Insufficient documentation

## 2016-04-03 DIAGNOSIS — R0683 Snoring: Secondary | ICD-10-CM | POA: Diagnosis not present

## 2016-04-03 DIAGNOSIS — Z7982 Long term (current) use of aspirin: Secondary | ICD-10-CM | POA: Insufficient documentation

## 2016-04-03 LAB — BASIC METABOLIC PANEL
Anion gap: 13 (ref 5–15)
BUN: 47 mg/dL — AB (ref 6–20)
CHLORIDE: 82 mmol/L — AB (ref 101–111)
CO2: 37 mmol/L — ABNORMAL HIGH (ref 22–32)
Calcium: 9.6 mg/dL (ref 8.9–10.3)
Creatinine, Ser: 2.14 mg/dL — ABNORMAL HIGH (ref 0.61–1.24)
GFR calc Af Amer: 36 mL/min — ABNORMAL LOW (ref >60)
GFR calc non Af Amer: 31 mL/min — ABNORMAL LOW (ref >60)
GLUCOSE: 402 mg/dL — AB (ref 65–99)
POTASSIUM: 4.1 mmol/L (ref 3.5–5.1)
Sodium: 132 mmol/L — ABNORMAL LOW (ref 135–145)

## 2016-04-03 LAB — BRAIN NATRIURETIC PEPTIDE: B Natriuretic Peptide: 23.9 pg/mL (ref 0.0–100.0)

## 2016-04-03 MED ORDER — METOLAZONE 2.5 MG PO TABS: ORAL_TABLET | ORAL | Status: DC

## 2016-04-03 NOTE — Patient Instructions (Signed)
Take Metolazone 2.5 mg (1 tablet) every Tuesday and Friday.  Take an extra of Potassium when you take Metolazone.  You have been referred to for a Sleep Study with Pulmonary.  Routine lab work today. Will notify you of abnormal results  Follow up with in 2 weeks.   [facility] : [phone]

## 2016-04-03 NOTE — Telephone Encounter (Signed)
Notes Recorded by Chyrl Civatte, RN on 04/03/2016 at 4:46 PM Results and med changes reviewed with patient. Aware and agreeable to plan, Metolazone discontinued in chart. Notes Recorded by [name], MD on 04/03/2016 at 4:21 PM Creatinine is high. Do NOT increase metolazone to twice weekly, stay off metolazone for now. Hold torsemide x 1 day then resume at current dose. Needs to watch sodium intake closely. Please call today.

## 2016-04-04 NOTE — Progress Notes (Signed)
Patient ID: Antonio [name], male   DOB: 1951/01/30, 65 y.o.   MRN: [id number]    Advanced Heart Failure Clinic Note   PCP: Dr Leonette Most [facility]) HF Cardiology: Dr. Shirlee Latch  65 yo with bioprosthetic AVR, chronic diastolic CHF, CKD and morbid obesity presents for CHF clinic followup.  Last echo in 10/16 showed normal bioprosthetic aortic valve and normal EF.  He was  admitted in 10/16 with volume overload and diuresed.  He was sent home on torsemide 80 qam/40 qpm.  He sees a nephrologist in [facility].   He was admitted again in 11/16 with acute on chronic diastolic CHF and inability to do ADLs at home.  He was diuresed and discharged to a rehab facility.    Admitted 01/01/16 - 01/09/2016 with hypercapneic respiratory failure 2/2 to CPAP non-compliance due to poor tolerance and ? HCAP. Treated with BiPAP and ABX. Volume thought to be stable. Creatinine better than baseline that admission and discharge weight 361 lbs.   Admitted 4/26 - 02/27/16 with dizziness and hypotension. Diuretics held but later restarted. Discharge weight was 371 pounds. AHC was to follow.   He is at home now.  Using home oxygen at night, sometimes during the day. Breathing is stable, walking around house without dyspnea.  Dyspnea with longer distances.  No chest pain. Ex-wives help with shopping, cleaning.  Weight is up 9 lbs.    Labs (10/16): K 3.7, creatinine 1.76, hgb 11.7  Labs (11/16): K 3.6, creatinine 1.68 Labs (12/16): K 4.5, creatinine 2.28 Labs (5/17): K 4.1, creatinine 1.35 Labs (6/17): creatinine 2.14  PMH: 1. Severe aortic stenosis: s/p bioprosthetic AVR in 1/13.  2. 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   - RHC (12/16): mean RA 13, PA 36/14 mean 25, mean PCWP 21, CI 2.21.  - Echo (3/17) with EF 60-65%, grade II diastolic dysfunction, bioprosthetic aortic valve functioning normally,  D-shaped interventricular septum with moderately dilated RV with severe systolic dysfunction.  - V/Q scan (3/17) without evidence for acute or chronic PE.  3. 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  4. Type II diabetes 5. Gout  6. CKD 7. Subclinical hypothyroidism 8. LHC (1/12) with normal coronaries. 9. Chronic LBBB 10. Morbid obesity  Family History  Problem Relation Age of Onset  . Heart attack Father   . Cancer Mother    Social History   Social History  . Marital Status: Divorced    Spouse Name: N/A  . Number of Children: N/A  . Years of Education: N/A   Occupational History  . retired travelling Engineer, site    Social History Main Topics  . Smoking status: Never Smoker   . Smokeless tobacco: Never Used  . Alcohol Use: Yes     Comment: 02/18/2015 "once or twice/yr I'll have a cocktail"      rare  . Drug Use: No  . Sexual Activity: Not Currently   Other Topics Concern  . None   Social History Narrative   ROS: All systems reviewed and negative except as per HPI  Current Outpatient Prescriptions  Medication Sig Dispense Refill  . Ascorbic Acid (VITAMIN C) 100 MG tablet Take 100 mg by mouth daily.    Marland Kitchen aspirin 81 MG tablet Take 81 mg by mouth daily.    Marland Kitchen atorvastatin (LIPITOR) 40 MG tablet Take 40 mg by mouth daily at  6 PM.     . colchicine 0.6 MG tablet Take 0.6 mg by mouth daily.     . febuxostat (ULORIC) 40 MG tablet Take 40 mg by mouth daily.     . fenofibrate 54 MG tablet Take 54 mg by mouth daily.  0  . fluticasone (FLONASE) 50 MCG/ACT nasal spray Place 1 spray into both nostrils daily. 16 g 2  . hydrocerin (EUCERIN) CREA Apply 1 application topically 2 (two) times daily. 113 g 0  . insulin detemir (LEVEMIR) 100 UNIT/ML injection Inject 50 Units into the skin 2 (two) times daily.    Marland Kitchen lidocaine (LINDAMANTLE) 3 % CREA cream Apply 1 application topically 3 (three) times daily as needed (rash).    . NOVOLOG 100 UNIT/ML injection Inject 30-40 Units into  the skin 3 (three) times daily with meals. Sliding scale  0  . OXYGEN Inhale 4 L into the lungs at bedtime.    . potassium chloride SA (K-DUR,KLOR-CON) 20 MEQ tablet Take 40 meq (2 tablets) in the morning and 20 meq (1 tablet) in the evening.  Take 1 extra tab when you take Metolazone    . torsemide (DEMADEX) 20 MG tablet Take 4 tablets (80 mg total) by mouth 2 (two) times daily. 240 tablet 0  . HYDROcodone-acetaminophen (NORCO) 7.5-325 MG tablet Take 1 tablet by mouth every 4 (four) hours as needed for moderate pain. (Patient not taking: Reported on 04/03/2016) 10 tablet 0  . sodium chloride (OCEAN) 0.65 % SOLN nasal spray Place 1 spray into both nostrils as needed for congestion. (Patient not taking: Reported on 03/06/2016) 1 Bottle 0   No current facility-administered medications for this encounter.   BP 128/82 mmHg  Pulse 101  Wt 384 lb 8 oz (174.408 kg)  SpO2 92% General: NAD in wheelchair.  Neck: Thick, JVP difficult but does not appear elevated. no thyromegaly or thyroid nodule.  Lungs: CTAB, normal effort.  CV: Nondisplaced PMI.  Heart regular S1/S2, no S3/S4, 1/6 SEM RUSB.  R and LLE trace  edema.  No carotid bruit.  Normal pedal pulses.  Abdomen: Morbidly obese, soft, NT, ND, no HSM. No bruits or masses. +BS  Skin: Intact  Neurologic: Alert and oriented x 3.  Psych: Normal affect. Extremities: No clubbing or cyanosis. BLE edema.   RLE with significant venous stasis changes.  HEENT: Normal.   Assessment/Plan: 1. 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  Exam is difficult, but he does not look markedly volume elevated and symptoms are stable (NYHA class (III). However, weight is up. Labs were checked today and show a significant rise in creatinine from his baseline. - Continue low salt diet and limiting fluid intake to < 2 liters per day.  - Stop metolazone.  Hold torsemide for 1 day then  resume at 80 mg bid.  2. CKD: Stage III. Creatinine up today.  Hold torsemide for a day and stopping metolazone as above.     3. Bioprosthetic aortic valve: Stable on last echo.  4. Morbid Obesity: Discussed portion control. Needs to lose weight. I recommended that he look into the bariatric program at [facility].  5. OHS/OSA: Now on chronic 02. He needs repeat sleep study for CPAP titration, I will arrange.   Followup in 10 days in the office, will get BMET.    [name] 04/04/2016

## 2016-04-05 ENCOUNTER — Telehealth (HOSPITAL_COMMUNITY): Payer: Self-pay | Admitting: Vascular Surgery

## 2016-04-05 ENCOUNTER — Other Ambulatory Visit (HOSPITAL_COMMUNITY): Payer: Self-pay | Admitting: *Deleted

## 2016-04-05 MED ORDER — TORSEMIDE 20 MG PO TABS: 80.0000 mg | ORAL_TABLET | Freq: Two times a day (BID) | ORAL | Status: DC

## 2016-04-05 NOTE — Telephone Encounter (Signed)
Pt needs ASAP REFILL Torsemide, [facility]  he hasnt had it in 3 days

## 2016-05-02 ENCOUNTER — Other Ambulatory Visit (HOSPITAL_COMMUNITY): Payer: Self-pay | Admitting: *Deleted

## 2016-05-02 MED ORDER — TORSEMIDE 20 MG PO TABS: 80.0000 mg | ORAL_TABLET | Freq: Two times a day (BID) | ORAL | Status: AC

## 2016-05-03 ENCOUNTER — Ambulatory Visit (HOSPITAL_COMMUNITY)
Admission: RE | Admit: 2016-05-03 | Discharge: 2016-05-03 | Disposition: A | Discharge status: Home / Self Care | Payer: Medicare Other | Source: Ambulatory Visit | Attending: Internal Medicine | Admitting: Internal Medicine

## 2016-05-03 ENCOUNTER — Encounter (HOSPITAL_COMMUNITY): Payer: Self-pay

## 2016-05-03 VITALS — BP 109/78 | HR 100 | Resp 24 | Wt 388.0 lb

## 2016-05-03 DIAGNOSIS — Z952 Presence of prosthetic heart valve: Secondary | ICD-10-CM | POA: Diagnosis not present

## 2016-05-03 DIAGNOSIS — G4733 Obstructive sleep apnea (adult) (pediatric): Secondary | ICD-10-CM | POA: Insufficient documentation

## 2016-05-03 DIAGNOSIS — I5033 Acute on chronic diastolic (congestive) heart failure: Secondary | ICD-10-CM

## 2016-05-03 DIAGNOSIS — I350 Nonrheumatic aortic (valve) stenosis: Secondary | ICD-10-CM | POA: Insufficient documentation

## 2016-05-03 DIAGNOSIS — G4730 Sleep apnea, unspecified: Secondary | ICD-10-CM

## 2016-05-03 DIAGNOSIS — E1122 Type 2 diabetes mellitus with diabetic chronic kidney disease: Secondary | ICD-10-CM | POA: Diagnosis not present

## 2016-05-03 DIAGNOSIS — E669 Obesity, unspecified: Secondary | ICD-10-CM

## 2016-05-03 DIAGNOSIS — Z794 Long term (current) use of insulin: Secondary | ICD-10-CM | POA: Insufficient documentation

## 2016-05-03 DIAGNOSIS — Z7982 Long term (current) use of aspirin: Secondary | ICD-10-CM | POA: Diagnosis not present

## 2016-05-03 DIAGNOSIS — I5032 Chronic diastolic (congestive) heart failure: Secondary | ICD-10-CM | POA: Diagnosis present

## 2016-05-03 DIAGNOSIS — M109 Gout, unspecified: Secondary | ICD-10-CM | POA: Insufficient documentation

## 2016-05-03 DIAGNOSIS — E039 Hypothyroidism, unspecified: Secondary | ICD-10-CM | POA: Insufficient documentation

## 2016-05-03 DIAGNOSIS — N183 Chronic kidney disease, stage 3 unspecified: Secondary | ICD-10-CM

## 2016-05-03 LAB — BASIC METABOLIC PANEL
Anion gap: 11 (ref 5–15)
BUN: 34 mg/dL — AB (ref 6–20)
CALCIUM: 9 mg/dL (ref 8.9–10.3)
CHLORIDE: 85 mmol/L — AB (ref 101–111)
CO2: 42 mmol/L — AB (ref 22–32)
CREATININE: 1.18 mg/dL (ref 0.61–1.24)
GFR calc non Af Amer: >60 mL/min (ref >60)
Glucose, Bld: 221 mg/dL — ABNORMAL HIGH (ref 65–99)
Potassium: 4.3 mmol/L (ref 3.5–5.1)
Sodium: 138 mmol/L (ref 135–145)

## 2016-05-03 NOTE — Progress Notes (Signed)
Patient ID: Antonio [name], male   DOB: Feb 11, 1951, 65 y.o.   MRN: [id number]    Advanced Heart Failure Clinic Note   PCP: Dr Leonette Most [facility]) HF Cardiology: Dr. Shirlee Latch  65 yo with bioprosthetic AVR, chronic diastolic CHF, CKD and morbid obesity presents for CHF clinic followup.  Last echo in 10/16 showed normal bioprosthetic aortic valve and normal EF.  He was  admitted in 10/16 with volume overload and diuresed.  He was sent home on torsemide 80 qam/40 qpm.  He sees a nephrologist in [facility].   He was admitted again in 11/16 with acute on chronic diastolic CHF and inability to do ADLs at home.  He was diuresed and discharged to a rehab facility.    Admitted 01/01/16 - 01/09/2016 with hypercapneic respiratory failure 2/2 to CPAP non-compliance due to poor tolerance and ? HCAP. Treated with BiPAP and ABX. Volume thought to be stable. Creatinine better than baseline that admission and discharge weight 361 lbs.   Admitted 4/26 - 02/27/16 with dizziness and hypotension. Diuretics held but later restarted. Discharge weight was 371 pounds. AHC was to follow.   He returns for HF follow up. Overall feeling ok. Ongoing mild dyspnea with exertion. Wears 4 liters oxygen at home. Tires to follow low salt diet. DeniesOrthopnea/chest pain. Ex-wives help with shopping, cleaning.   Driven to appointments by [facility]. Weight 387-390  Pounds.  Has not seen paramedicine in the last 3 weeks because he says he was to tired.    Labs (10/16): K 3.7, creatinine 1.76, hgb 11.7  Labs (11/16): K 3.6, creatinine 1.68 Labs (12/16): K 4.5, creatinine 2.28 Labs (5/17): K 4.1, creatinine 1.35 Labs (6/17): creatinine 2.14  PMH: 1. Severe aortic stenosis: s/p bioprosthetic AVR in 1/13.  2. 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   - RHC (12/16): mean RA 13, PA 36/14 mean 25, mean PCWP 21, CI 2.21.    - Echo (3/17) with EF 60-65%, grade II diastolic dysfunction, bioprosthetic aortic valve functioning normally, D-shaped interventricular septum with moderately dilated RV with severe systolic dysfunction.  - V/Q scan (3/17) without evidence for acute or chronic PE.  3. 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  4. Type II diabetes 5. Gout  6. CKD 7. Subclinical hypothyroidism 8. LHC (1/12) with normal coronaries. 9. Chronic LBBB 10. Morbid obesity  Family History  Problem Relation Age of Onset  . Heart attack Father   . Cancer Mother    Social History   Social History  . Marital Status: Divorced    Spouse Name: N/A  . Number of Children: N/A  . Years of Education: N/A   Occupational History  . retired travelling Engineer, site    Social History Main Topics  . Smoking status: Never Smoker   . Smokeless tobacco: Never Used  . Alcohol Use: Yes     Comment: 02/18/2015 "once or twice/yr I'll have a cocktail"      rare  . Drug Use: No  . Sexual Activity: Not Currently   Other Topics Concern  . None   Social History Narrative   ROS: All systems reviewed and negative except as per HPI  Current Outpatient Prescriptions  Medication Sig Dispense Refill  . Ascorbic Acid (VITAMIN C) 100 MG tablet Take 100 mg by mouth daily.    Marland Kitchen aspirin 81 MG tablet Take 81  mg by mouth daily.    Marland Kitchen atorvastatin (LIPITOR) 40 MG tablet Take 40 mg by mouth daily at 6 PM.     . colchicine 0.6 MG tablet Take 0.6 mg by mouth daily.     . febuxostat (ULORIC) 40 MG tablet Take 40 mg by mouth daily.     . fenofibrate 54 MG tablet Take 54 mg by mouth daily.  0  . fluticasone (FLONASE) 50 MCG/ACT nasal spray Place 1 spray into both nostrils daily. 16 g 2  . hydrocerin (EUCERIN) CREA Apply 1 application topically 2 (two) times daily. 113 g 0  . HYDROcodone-acetaminophen (NORCO) 7.5-325 MG tablet Take 1 tablet by mouth every 4 (four) hours as needed for moderate pain. 10 tablet 0  . insulin detemir  (LEVEMIR) 100 UNIT/ML injection Inject 50 Units into the skin 2 (two) times daily.    Marland Kitchen lidocaine (LINDAMANTLE) 3 % CREA cream Apply 1 application topically 3 (three) times daily as needed (rash).    . NOVOLOG 100 UNIT/ML injection Inject 30-40 Units into the skin 3 (three) times daily with meals. Sliding scale  0  . Omega-3 Fatty Acids (FISH OIL) 1200 MG CAPS Take 1,200 mg by mouth daily.    . OXYGEN Inhale 4 L into the lungs at bedtime.    . potassium chloride SA (K-DUR,KLOR-CON) 20 MEQ tablet Take 40 meq (2 tablets) in the morning and 20 meq (1 tablet) in the evening.  Take 1 extra tab when you take Metolazone    . Saccharomyces boulardii (PROBIOTIC) 250 MG CAPS Take 250 mg by mouth 2 (two) times daily.    Marland Kitchen torsemide (DEMADEX) 20 MG tablet Take 4 tablets (80 mg total) by mouth 2 (two) times daily. 240 tablet 6  . sodium chloride (OCEAN) 0.65 % SOLN nasal spray Place 1 spray into both nostrils as needed for congestion. (Patient not taking: Reported on 03/06/2016) 1 Bottle 0   No current facility-administered medications for this encounter.   BP 109/78 mmHg  Pulse 100  Resp 24  Wt 388 lb (175.996 kg)  SpO2 92% General: NAD in wheelchair.  Neck: Thick, JVP difficult but does not appear elevated. no thyromegaly or thyroid nodule.  Lungs: CTAB, normal effort. On 4 liter oxygen.  CV: Nondisplaced PMI.  Heart regular S1/S2, no S3/S4, 1/6 SEM RUSB.  R and LLE trace  edema.  No carotid bruit.  Normal pedal pulses.  Abdomen: Morbidly obese, soft, NT, ND, no HSM. No bruits or masses. +BS  Skin: Intact  Neurologic: Alert and oriented x 3.  Psych: Normal affect. Extremities: No clubbing or cyanosis. BLE trace edema.   RLE with significant venous stasis changes.  HEENT: Normal.   Assessment/Plan: 1. 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  NYHA III. Volume status stable. Continue  torsemide  80 mg bid.    Reinforced daily weights, low salt food choices, and limiting fluid intake < 2 liters per day.  2. CKD: Stage III. Creatinine up today.  Hold torsemide for a day and stopping metolazone as above.     3. Bioprosthetic aortic valve: Stable on last echo.  4. Morbid Obesity: Discussed portion control. Needs to lose weight. He is going to follow up with [facility] for weigh reduction.  5. OHS/OSA: Now on chronic 02. He needs repeat sleep study for CPAP titration.    Follow up in 6 weeks with Dr Shirlee Latch.  Continue Paramedicine and I have asked him to allow Paramedicine to visit.   Amy Clegg NP-C  05/03/2016

## 2016-05-03 NOTE — Progress Notes (Signed)
Advanced Heart Failure Medication Review by a Pharmacist  Does the patient  feel that his/her medications are working for him/her?  yes  Has the patient been experiencing any side effects to the medications prescribed?  no  Does the patient measure his/her own blood pressure or blood glucose at home?  yes   Does the patient have any problems obtaining medications due to transportation or finances?   no  Understanding of regimen: good Understanding of indications: good Potential of compliance: good Patient understands to avoid NSAIDs. Patient understands to avoid decongestants.  Issues to address at subsequent visits: None   Pharmacist comments:  [name]") is a pleasant and very talkative 65 yo M presenting with [name] and without a medication list but able to verbalize each of his medications to me. He reports good compliance with his regimen but states that it is sometimes difficult to get his KCl tablets down. I have suggested splitting in half or dissolving in a small amount of water or juice. He also asked about starting the apple cider vinegar diet. I recommended not starting this diet because apple cider vinegar has been associated with hypokalemia which is concerning in combination with his chronic torsemide use. No other medication-related questions or concerns for me at this time.   Tyler Deis. Bonnye Fava, PharmD, BCPS, CPP Clinical Pharmacist Pager: [phone] Phone: [phone] 05/03/2016 11:28 AM      Time with patient: 10 minutes Preparation and documentation time: 4 minutes Total time: 14 minutes

## 2016-05-03 NOTE — Patient Instructions (Signed)
Labs today  Your physician recommends that you schedule a follow-up appointment in: 6 weeks with Dr.McLean  Do the following things EVERYDAY: 1) Weigh yourself in the morning before breakfast. Write it down and keep it in a log. 2) Take your medicines as prescribed 3) Eat low salt foods-Limit salt (sodium) to 2000 mg per day.  4) Stay as active as you can everyday 5) Limit all fluids for the day to less than 2 liters 6)   

## 2016-05-09 ENCOUNTER — Telehealth (HOSPITAL_COMMUNITY): Payer: Self-pay | Admitting: *Deleted

## 2016-05-09 NOTE — Telephone Encounter (Signed)
[name] called to report pt was having increased SOB today, she reports his scale has not been working correctly so she is unsure of his wt.  He admitted missing meds yesterday AM but took PM meds and took meds this AM.  Advised ok to take 1 dose of metolazone and see if that helps.  Florentina Addison will advise pt

## 2016-05-15 ENCOUNTER — Telehealth (HOSPITAL_COMMUNITY): Payer: Self-pay | Admitting: Cardiology

## 2016-05-15 NOTE — Telephone Encounter (Signed)
Antonio [name] with [employer]. Home paramedicine called during home visit today Weight is up again despite taking metolazone last week Weight today 393, patient has not been checking weight daily, scale was just delivered today  Denies increased SOB, chest pains  Please advise as next follow up is 8/21

## 2016-05-15 NOTE — Telephone Encounter (Signed)
Patient aware, will take metolazone today with additional potassium as ordered Unable to return for labs on Friday, will have BMET done through PCP

## 2016-05-15 NOTE — Telephone Encounter (Signed)
Ok to take metolazone this week.   Can we get labs Friday? Can do via HH if that is an option for him.

## 2016-06-01 ENCOUNTER — Other Ambulatory Visit: Payer: Self-pay | Admitting: Student

## 2016-06-01 ENCOUNTER — Encounter (HOSPITAL_COMMUNITY): Payer: Self-pay

## 2016-06-01 ENCOUNTER — Telehealth: Payer: Self-pay | Admitting: Student

## 2016-06-01 ENCOUNTER — Emergency Department (HOSPITAL_COMMUNITY): Payer: Medicare Other

## 2016-06-01 ENCOUNTER — Inpatient Hospital Stay (HOSPITAL_COMMUNITY)
Admission: EM | Admit: 2016-06-01 | Discharge: 2016-06-11 | DRG: 291 | Disposition: A | Discharge status: Skilled Nursing Facility | Payer: Medicare Other | Attending: Internal Medicine | Admitting: Internal Medicine

## 2016-06-01 DIAGNOSIS — E873 Alkalosis: Secondary | ICD-10-CM | POA: Diagnosis not present

## 2016-06-01 DIAGNOSIS — Z8614 Personal history of Methicillin resistant Staphylococcus aureus infection: Secondary | ICD-10-CM

## 2016-06-01 DIAGNOSIS — I509 Heart failure, unspecified: Secondary | ICD-10-CM

## 2016-06-01 DIAGNOSIS — E1165 Type 2 diabetes mellitus with hyperglycemia: Secondary | ICD-10-CM | POA: Diagnosis present

## 2016-06-01 DIAGNOSIS — J9621 Acute and chronic respiratory failure with hypoxia: Secondary | ICD-10-CM | POA: Diagnosis present

## 2016-06-01 DIAGNOSIS — Z8249 Family history of ischemic heart disease and other diseases of the circulatory system: Secondary | ICD-10-CM | POA: Diagnosis not present

## 2016-06-01 DIAGNOSIS — E1122 Type 2 diabetes mellitus with diabetic chronic kidney disease: Secondary | ICD-10-CM | POA: Diagnosis present

## 2016-06-01 DIAGNOSIS — N179 Acute kidney failure, unspecified: Secondary | ICD-10-CM | POA: Diagnosis present

## 2016-06-01 DIAGNOSIS — E785 Hyperlipidemia, unspecified: Secondary | ICD-10-CM | POA: Diagnosis present

## 2016-06-01 DIAGNOSIS — I5033 Acute on chronic diastolic (congestive) heart failure: Secondary | ICD-10-CM | POA: Diagnosis present

## 2016-06-01 DIAGNOSIS — Z794 Long term (current) use of insulin: Secondary | ICD-10-CM | POA: Diagnosis not present

## 2016-06-01 DIAGNOSIS — E11649 Type 2 diabetes mellitus with hypoglycemia without coma: Secondary | ICD-10-CM | POA: Diagnosis not present

## 2016-06-01 DIAGNOSIS — I472 Ventricular tachycardia: Secondary | ICD-10-CM | POA: Diagnosis not present

## 2016-06-01 DIAGNOSIS — I130 Hypertensive heart and chronic kidney disease with heart failure and stage 1 through stage 4 chronic kidney disease, or unspecified chronic kidney disease: Secondary | ICD-10-CM | POA: Diagnosis present

## 2016-06-01 DIAGNOSIS — Y92019 Unspecified place in single-family (private) house as the place of occurrence of the external cause: Secondary | ICD-10-CM

## 2016-06-01 DIAGNOSIS — L8990 Pressure ulcer of unspecified site, unspecified stage: Secondary | ICD-10-CM | POA: Insufficient documentation

## 2016-06-01 DIAGNOSIS — Z9981 Dependence on supplemental oxygen: Secondary | ICD-10-CM | POA: Diagnosis not present

## 2016-06-01 DIAGNOSIS — E876 Hypokalemia: Secondary | ICD-10-CM | POA: Diagnosis not present

## 2016-06-01 DIAGNOSIS — T380X5A Adverse effect of glucocorticoids and synthetic analogues, initial encounter: Secondary | ICD-10-CM | POA: Diagnosis present

## 2016-06-01 DIAGNOSIS — H5441 Blindness, right eye, normal vision left eye: Secondary | ICD-10-CM | POA: Diagnosis present

## 2016-06-01 DIAGNOSIS — E669 Obesity, unspecified: Secondary | ICD-10-CM | POA: Diagnosis not present

## 2016-06-01 DIAGNOSIS — Z6841 Body Mass Index (BMI) 40.0 and over, adult: Secondary | ICD-10-CM | POA: Diagnosis not present

## 2016-06-01 DIAGNOSIS — S81801A Unspecified open wound, right lower leg, initial encounter: Secondary | ICD-10-CM | POA: Diagnosis present

## 2016-06-01 DIAGNOSIS — I4510 Unspecified right bundle-branch block: Secondary | ICD-10-CM | POA: Diagnosis present

## 2016-06-01 DIAGNOSIS — M10062 Idiopathic gout, left knee: Secondary | ICD-10-CM | POA: Diagnosis not present

## 2016-06-01 DIAGNOSIS — G4733 Obstructive sleep apnea (adult) (pediatric): Secondary | ICD-10-CM | POA: Diagnosis present

## 2016-06-01 DIAGNOSIS — Z9119 Patient's noncompliance with other medical treatment and regimen: Secondary | ICD-10-CM

## 2016-06-01 DIAGNOSIS — Z954 Presence of other heart-valve replacement: Secondary | ICD-10-CM | POA: Diagnosis not present

## 2016-06-01 DIAGNOSIS — N183 Chronic kidney disease, stage 3 (moderate): Secondary | ICD-10-CM | POA: Diagnosis present

## 2016-06-01 DIAGNOSIS — Z9111 Patient's noncompliance with dietary regimen: Secondary | ICD-10-CM

## 2016-06-01 DIAGNOSIS — I871 Compression of vein: Secondary | ICD-10-CM | POA: Diagnosis present

## 2016-06-01 DIAGNOSIS — I5023 Acute on chronic systolic (congestive) heart failure: Secondary | ICD-10-CM | POA: Diagnosis not present

## 2016-06-01 DIAGNOSIS — E119 Type 2 diabetes mellitus without complications: Secondary | ICD-10-CM | POA: Diagnosis not present

## 2016-06-01 DIAGNOSIS — M25562 Pain in left knee: Secondary | ICD-10-CM

## 2016-06-01 DIAGNOSIS — M109 Gout, unspecified: Secondary | ICD-10-CM | POA: Diagnosis present

## 2016-06-01 DIAGNOSIS — M25569 Pain in unspecified knee: Secondary | ICD-10-CM

## 2016-06-01 DIAGNOSIS — Z953 Presence of xenogenic heart valve: Secondary | ICD-10-CM | POA: Diagnosis not present

## 2016-06-01 LAB — CBC WITH DIFFERENTIAL/PLATELET
Basophils Absolute: 0 10*3/uL (ref 0.0–0.1)
Basophils Relative: 0 %
Eosinophils Absolute: 0.3 10*3/uL (ref 0.0–0.7)
Eosinophils Relative: 2 %
HCT: 40 % (ref 39.0–52.0)
HEMOGLOBIN: 11.5 g/dL — AB (ref 13.0–17.0)
LYMPHS ABS: 2.5 10*3/uL (ref 0.7–4.0)
LYMPHS PCT: 19 %
MCH: 25.2 pg — ABNORMAL LOW (ref 26.0–34.0)
MCHC: 28.8 g/dL — AB (ref 30.0–36.0)
MCV: 87.7 fL (ref 78.0–100.0)
MONOS PCT: 6 %
Monocytes Absolute: 0.9 10*3/uL (ref 0.1–1.0)
NEUTROS ABS: 10 10*3/uL — AB (ref 1.7–7.7)
NEUTROS PCT: 73 %
Platelets: 270 10*3/uL (ref 150–400)
RBC: 4.56 MIL/uL (ref 4.22–5.81)
RDW: 15.5 % (ref 11.5–15.5)
WBC: 13.7 10*3/uL — ABNORMAL HIGH (ref 4.0–10.5)

## 2016-06-01 LAB — COMPREHENSIVE METABOLIC PANEL
ALT: 19 U/L (ref 17–63)
ANION GAP: 7 (ref 5–15)
AST: 27 U/L (ref 15–41)
Albumin: 3.1 g/dL — ABNORMAL LOW (ref 3.5–5.0)
Alkaline Phosphatase: 102 U/L (ref 38–126)
BUN: 31 mg/dL — ABNORMAL HIGH (ref 6–20)
CHLORIDE: 89 mmol/L — AB (ref 101–111)
CO2: 41 mmol/L — AB (ref 22–32)
Calcium: 9.1 mg/dL (ref 8.9–10.3)
Creatinine, Ser: 1.4 mg/dL — ABNORMAL HIGH (ref 0.61–1.24)
GFR calc non Af Amer: 51 mL/min — ABNORMAL LOW (ref >60)
GFR, EST AFRICAN AMERICAN: 59 mL/min — AB (ref >60)
Glucose, Bld: 346 mg/dL — ABNORMAL HIGH (ref 65–99)
POTASSIUM: 4.5 mmol/L (ref 3.5–5.1)
SODIUM: 137 mmol/L (ref 135–145)
Total Bilirubin: 0.7 mg/dL (ref 0.3–1.2)
Total Protein: 7.1 g/dL (ref 6.5–8.1)

## 2016-06-01 LAB — POCT I-STAT TROPONIN I: TROPONIN I, POC: 0.03 ng/mL (ref 0.00–0.08)

## 2016-06-01 LAB — I-STAT TROPONIN, ED: TROPONIN I, POC: 0 ng/mL (ref 0.00–0.08)

## 2016-06-01 LAB — BRAIN NATRIURETIC PEPTIDE: B Natriuretic Peptide: 56.7 pg/mL (ref 0.0–100.0)

## 2016-06-01 MED ORDER — COLCHICINE 0.6 MG PO TABS: 0.6000 mg | ORAL_TABLET | Freq: Every day | ORAL | 0 refills | Status: AC

## 2016-06-01 MED ORDER — FUROSEMIDE 10 MG/ML IJ SOLN
80.0000 mg | Freq: Once | INTRAMUSCULAR | Status: AC
  Administered 2016-06-01: 80 mg via INTRAVENOUS
  Filled 2016-06-01: qty 8

## 2016-06-01 MED ORDER — PREDNISONE 20 MG PO TABS: ORAL_TABLET | ORAL | 0 refills | Status: DC

## 2016-06-01 MED ORDER — ACETAMINOPHEN 500 MG PO TABS
1000.0000 mg | ORAL_TABLET | Freq: Once | ORAL | Status: AC
  Administered 2016-06-02: 1000 mg via ORAL
  Filled 2016-06-01: qty 2

## 2016-06-01 MED ORDER — PREDNISONE 20 MG PO TABS
40.0000 mg | ORAL_TABLET | Freq: Once | ORAL | Status: AC
  Administered 2016-06-02: 40 mg via ORAL
  Filled 2016-06-01: qty 2

## 2016-06-01 NOTE — ED Triage Notes (Signed)
Pt presents to the ed with ems for shortness of breath starting 2 days ago that got worse today. Pt reports gaining 16 lbs in 2 weeks and swelling in both extremities. Complaints of pain in his left knee, alert and oriented, arrivers to er on cpap and placed on bipap. On ems arrival the patients oxygen was 86% on RA

## 2016-06-01 NOTE — ED Notes (Signed)
Attempted to call report x 1  

## 2016-06-01 NOTE — ED Provider Notes (Signed)
[facility]-EMERGENCY DEPT Provider Note   CSN: [id number] Arrival date & time: 06/01/16  2039  First Provider Contact:  First MD Initiated Contact with Patient 06/01/16 2049     History   Chief Complaint Chief Complaint  Patient presents with  . Shortness of Breath    HPI Antonio [name] is a 65 y.o. male.  The history is provided by the patient. No language interpreter was used.  Shortness of Breath  This is a new problem. The average episode lasts 2 days. The problem occurs continuously.The current episode started 2 days ago. The problem has been gradually worsening. Associated symptoms include PND, orthopnea and leg swelling. Pertinent negatives include no fever, no headaches, no sore throat, no ear pain, no cough, no wheezing, no chest pain, no vomiting, no abdominal pain and no rash. He has tried nothing for the symptoms. The treatment provided no relief. Associated medical issues include heart failure.    Past Medical History:  Diagnosis Date  . Anxiety   . Aortic stenosis    a. s/p AVR 11/29/11  . Arthritis   . Blindness of right eye    a. due to amblyopia as child  . Chronic diastolic heart failure (HCC)    a. Echo 08/11/2015 LVEF 55-60% with normal RV function  . DM2 (diabetes mellitus, type 2) (HCC)   . Gout   . Hepatitis   . History of acute renal failure   . HLD (hyperlipidemia)   . HTN (hypertension)   . Hx MRSA infection    a. thigh abcess 2006  . OSA on CPAP     Patient Active Problem List   Diagnosis Date Noted  . CHF (congestive heart failure) (HCC) 06/01/2016  . AKI (acute kidney injury) (HCC)   . Hypotension 02/23/2016  . Nausea and vomiting 02/23/2016  . Acute on chronic diastolic HF (heart failure) (HCC)   . Acute renal failure superimposed on stage 3 chronic kidney disease (HCC) 02/08/2016  . Diarrhea 02/08/2016  . Dehydration 02/08/2016  . Acute-on-chronic kidney injury (HCC) 02/08/2016  . CKD (chronic kidney disease), stage III 01/20/2016  .  Obesity hypoventilation syndrome (HCC)   . Insulin dependent diabetes mellitus (HCC)   . HCAP (healthcare-associated pneumonia)   . OSA on CPAP   . Morbid obesity (HCC)   . PNA (pneumonia) 01/03/2016  . Respiratory failure (HCC) 01/01/2016  . Shortness of breath   . Wound of right leg 11/03/2015  . Chronic diastolic CHF (congestive heart failure) (HCC) 09/08/2015  . S/P aortic valve replacement with bioprosthetic valve 02/22/2015  . PAF (paroxysmal atrial fibrillation) (HCC) 12/08/2011  . Sleep apnea 04/30/2011  . ABNORMAL CV (STRESS) TEST 11/15/2010  . Aortic stenosis 09/26/2010  . Hyperlipidemia 12/26/2006  . Gout 12/26/2006  . OBESITY, NOS 12/26/2006  . HYPERTENSION, BENIGN SYSTEMIC 12/26/2006  . EDEMA-LEGS,DUE TO VENOUS OBSTRUCT. 12/26/2006  . OSTEOARTHRITIS OF SPINE, NOS 12/26/2006    Past Surgical History:  Procedure Laterality Date  . AORTIC VALVE REPLACEMENT  11/29/2011   Procedure: AORTIC VALVE REPLACEMENT (AVR);  Surgeon: [name], MD;  Location[facility];  Service: Open Heart Surgery;  Laterality: N/A;  with nitric oxide  . BACK SURGERY    . CARDIAC CATHETERIZATION    . CARDIAC CATHETERIZATION N/A 10/14/2015   Procedure: Right Heart Cath;  Surgeon: [name], MD;  Location: [facility] INVASIVE CV LAB;  Service: Cardiovascular;  Laterality: N/A;  . CARDIAC VALVE REPLACEMENT    . INCISION AND DRAINAGE ABSCESS  Left 08/2005   "upper/inner thigh; that's where the MRSA was"  . LUMBAR LAMINECTOMY  10/30/1983  . RIGHT HEART CATHETERIZATION N/A 11/28/2011   Procedure: RIGHT HEART CATH;  Surgeon: [name], MD;  Location: [facility] CATH LAB;  Service: Cardiovascular;  Laterality: N/A;       Home Medications    Prior to Admission medications   Medication Sig Start Date End Date Taking? Authorizing Provider  Ascorbic Acid (VITAMIN C) 100 MG tablet Take 100 mg by mouth daily.   Yes Historical Provider, MD  aspirin 81 MG tablet Take 81 mg by mouth daily.   Yes  Historical Provider, MD  atorvastatin (LIPITOR) 40 MG tablet Take 40 mg by mouth daily at 6 PM.  12/05/14  Yes Historical Provider, MD  colchicine 0.6 MG tablet Take 1 tablet (0.6 mg total) by mouth daily. 06/01/16  Yes Ellsworth Lennox, PA  febuxostat (ULORIC) 40 MG tablet Take 40 mg by mouth daily.    Yes Historical Provider, MD  fenofibrate 54 MG tablet Take 54 mg by mouth daily. 05/04/15  Yes Historical Provider, MD  fluticasone (FLONASE) 50 MCG/ACT nasal spray Place 1 spray into both nostrils daily. Patient taking differently: Place 1 spray into both nostrils daily as needed for allergies.  01/09/16  Yes [name], MD  hydrocerin (EUCERIN) CREA Apply 1 application topically 2 (two) times daily. 09/19/15  Yes [name], PA-C  HYDROcodone-acetaminophen (NORCO) 7.5-325 MG tablet Take 1 tablet by mouth every 4 (four) hours as needed for moderate pain. 01/09/16  Yes [name], MD  insulin detemir (LEVEMIR) 100 UNIT/ML injection Inject 50 Units into the skin 2 (two) times daily.   Yes Historical Provider, MD  lidocaine (LINDAMANTLE) 3 % CREA cream Apply 1 application topically 3 (three) times daily as needed (rash).   Yes Historical Provider, MD  NOVOLOG 100 UNIT/ML injection Inject 30-40 Units into the skin 3 (three) times daily with meals. Sliding scale 05/12/15  Yes Historical Provider, MD  Omega-3 Fatty Acids (FISH OIL) 1200 MG CAPS Take 1,200 mg by mouth daily.   Yes Historical Provider, MD  OXYGEN Inhale 4 L into the lungs at bedtime.   Yes Historical Provider, MD  potassium chloride SA (K-DUR,KLOR-CON) 20 MEQ tablet Take 40 meq (2 tablets) in the morning and 20 meq (1 tablet) in the evening.  Take 1 extra tab when you take Metolazone 03/27/16  Yes [name], MD  Saccharomyces boulardii (PROBIOTIC) 250 MG CAPS Take 250 mg by mouth 2 (two) times daily.   Yes Historical Provider, MD  torsemide (DEMADEX) 20 MG tablet Take 4 tablets (80 mg total) by mouth 2 (two) times daily. 05/02/16  Yes  [name], MD  predniSONE (DELTASONE) 20 MG tablet Take 2 tablets (  total) by mouth once daily for two days, then 1 tablet ( ) once daily for two days. 06/01/16   Ellsworth Lennox, PA  sodium chloride (OCEAN) 0.65 % SOLN nasal spray Place 1 spray into both nostrils as needed for congestion. Patient not taking: Reported on 03/06/2016 01/09/16   [name], MD    Family History Family History  Problem Relation Age of Onset  . Cancer Mother   . Heart attack Father     Social History Social History  Substance Use Topics  . Smoking status: Never Smoker  . Smokeless tobacco: Never Used  . Alcohol use Yes     Comment: 02/18/2015 "once or twice/yr I'll have a cocktail"  rare     Allergies   Other   Review of Systems Review of Systems  Constitutional: Negative for chills and fever.  HENT: Negative for ear pain and sore throat.   Eyes: Negative for pain and visual disturbance.  Respiratory: Positive for shortness of breath. Negative for cough and wheezing.   Cardiovascular: Positive for orthopnea, leg swelling and PND. Negative for chest pain and palpitations.  Gastrointestinal: Positive for abdominal distention. Negative for abdominal pain and vomiting.  Genitourinary: Negative for dysuria and hematuria.  Musculoskeletal: Negative for arthralgias and back pain.  Skin: Negative for color change and rash.  Neurological: Negative for seizures, syncope and headaches.  All other systems reviewed and are negative.    Physical Exam Updated Vital Signs BP 126/62   Pulse 101   Temp 99.4 F (37.4 C) (Axillary)   Resp 26   Wt (!) 185.5 kg   SpO2 100%   BMI 57.04 kg/m   Physical Exam  Constitutional: He appears well-developed and well-nourished. He appears distressed.  HENT:  Head: Normocephalic and atraumatic.  Eyes: Conjunctivae are normal.  Neck: Neck supple.  Cardiovascular: Regular rhythm.  Tachycardia present.   No murmur heard. Pulmonary/Chest: Effort  normal. Tachypnea noted. No respiratory distress. He has rales.  Abdominal: Soft. He exhibits distension. There is no tenderness.  Musculoskeletal: He exhibits edema.  Neurological: He is alert.  Skin: Skin is warm and dry.  Psychiatric: He has a normal mood and affect.  Nursing note and vitals reviewed.    ED Treatments / Results  Labs (all labs ordered are listed, but only abnormal results are displayed) Labs Reviewed  COMPREHENSIVE METABOLIC PANEL - Abnormal; Notable for the following:       Result Value   Chloride 89 (*)    CO2 41 (*)    Glucose, Bld 346 (*)    BUN 31 (*)    Creatinine, Ser 1.40 (*)    Albumin 3.1 (*)    GFR calc non Af Amer 51 (*)    GFR calc Af Amer 59 (*)    All other components within normal limits  CBC WITH DIFFERENTIAL/PLATELET - Abnormal; Notable for the following:    WBC 13.7 (*)    Hemoglobin 11.5 (*)    MCH 25.2 (*)    MCHC 28.8 (*)    Neutro Abs 10.0 (*)    All other components within normal limits  BRAIN NATRIURETIC PEPTIDE  I-STAT TROPOININ, ED    EKG  EKG Interpretation  Date/Time:  Friday June 01 2016 20:42:21 EDT Ventricular Rate:  106 PR Interval:    QRS Duration: 144 QT Interval:  395 QTC Calculation: 525 R Axis:   -69 Text Interpretation:  Sinus tachycardia Right bundle branch block Inferior infarct, old Baseline wander in lead(s) II III aVF No significant change since last tracing Confirmed by KNOTT MD, DANIEL [phone]) on 06/01/2016 10:04:56 PM       Radiology Dg Chest Portable 1 View  Result Date: 06/01/2016 CLINICAL DATA:  Patient with shortness of breath. EXAM: PORTABLE CHEST 1 VIEW COMPARISON:  Chest radiograph 02/23/2016 FINDINGS: Low lung volumes. Marked cardiomegaly. Monitoring leads overlie the patient. Status post median sternotomy. Pulmonary vascular redistribution and new bilateral interstitial pulmonary opacities. Probable small bilateral pleural effusions. IMPRESSION: Cardiomegaly and interstitial pulmonary  edema with small bilateral pleural effusions. Markedly low lung volumes. Electronically Signed   By: Annia Belt M.D.   On: 06/01/2016 21:25    Procedures Procedures (including critical care time)  Medications  Ordered in ED Medications  acetaminophen (TYLENOL) tablet 1,000 mg (not administered)  predniSONE (DELTASONE) tablet 40 mg (not administered)  furosemide (LASIX) injection 80 mg (80 mg Intravenous Given 06/01/16 2111)     Initial Impression / Assessment and Plan / ED Course  I have reviewed the triage vital signs and the nursing notes.  Pertinent labs & imaging results that were available during my care of the patient were reviewed by me and considered in my medical decision making (see chart for details).  Clinical Course    Patient brought by EMS for evaluation and treatment of shortness of breath worsening over the past 2 days. Patient on CPAP upon arrival, tachypnea and tachycardic. When lied flat on stretcher patient with significant orthopnea. Lung exam reveals diffuse rales. Bilateral lower remedies swelling, right greater than left.  Right lower  extremity typically more edematous than left per patient. Patient takes torsemide at home for symptoms.  Patient afebrile, tachypnea, tachycardia. Suspect CHF exacerbation. Less likely pulmonary embolism as patient has chronically right greater than left edema and had Dopplers of lower extremities in April which revealed no DVT.  Chest x-ray with low lung volumes, fluid overload. Patient given 80 mg IV Lasix and admitted to hospitalist, stepdown unit on BiPAP.  Patient moved to stepdown unit in serious but stable condition.  Discussed case with my attending, Dr. Clydene Pugh.    Final Clinical Impressions(s) / ED Diagnoses   Final diagnoses:  Acute on chronic congestive heart failure, unspecified congestive heart failure type [facility])    New Prescriptions New Prescriptions   No medications on file     Dan Humphreys, MD 06/01/16  2252    Lyndal Pulley, MD 06/02/16 0040

## 2016-06-01 NOTE — ED Notes (Signed)
Pt taken off bipap and put on 3L [place] per hospitalist who is at bedside, patient tolerating well, 97% pulse ox

## 2016-06-01 NOTE — Telephone Encounter (Signed)
  Patient called reporting he is having a flare of acute gout in his left knee and can barely stand, starting earlier today. Has been out of his Colchicine for the past few days.  He reports the pain is severe and he has taken Prednisone for this in the past. I informed him he needs to contact his PCP for his acute gout, but he reports Dr. Shirlee Latch has treated his gout in the past due to this likely being associated with his diuretic use.   I reviewed the chart thoroughly and noted several times where he has been sent in Prednisone for his acute gout by the CHF clinic.   I informed him I will provide only 6 tablets of Prednisone and he will need to see his PCP after this if no improvement in his symptoms. Given 30 days of Colchicine with no refills. Reviewed the importance of monitoring his glucose closely with steroid medications.   He requested a prescription for pain medication and I declined to provide this. He was not pleased with this response, but I informed him pain medications will not come from his Cardiology office and he needs to see his PCP.  Perhaps Allopurinol should be considered in the future if he is having such frequent episodes of gout, granted this should come from his PCP as well.  Signed, Ellsworth Lennox, PA-C 06/01/2016, 6:48 PM

## 2016-06-02 ENCOUNTER — Inpatient Hospital Stay (HOSPITAL_COMMUNITY): Payer: Medicare Other

## 2016-06-02 DIAGNOSIS — M10062 Idiopathic gout, left knee: Secondary | ICD-10-CM

## 2016-06-02 DIAGNOSIS — Z794 Long term (current) use of insulin: Secondary | ICD-10-CM

## 2016-06-02 DIAGNOSIS — I5033 Acute on chronic diastolic (congestive) heart failure: Secondary | ICD-10-CM

## 2016-06-02 DIAGNOSIS — I509 Heart failure, unspecified: Secondary | ICD-10-CM

## 2016-06-02 DIAGNOSIS — Z954 Presence of other heart-valve replacement: Secondary | ICD-10-CM

## 2016-06-02 DIAGNOSIS — E119 Type 2 diabetes mellitus without complications: Secondary | ICD-10-CM

## 2016-06-02 LAB — CBC
HEMATOCRIT: 37.4 % — AB (ref 39.0–52.0)
Hemoglobin: 10.9 g/dL — ABNORMAL LOW (ref 13.0–17.0)
MCH: 25.5 pg — ABNORMAL LOW (ref 26.0–34.0)
MCHC: 29.1 g/dL — ABNORMAL LOW (ref 30.0–36.0)
MCV: 87.6 fL (ref 78.0–100.0)
PLATELETS: 252 10*3/uL (ref 150–400)
RBC: 4.27 MIL/uL (ref 4.22–5.81)
RDW: 15.3 % (ref 11.5–15.5)
WBC: 11.9 10*3/uL — AB (ref 4.0–10.5)

## 2016-06-02 LAB — C-REACTIVE PROTEIN: CRP: 11.8 mg/dL — ABNORMAL HIGH (ref <1.0)

## 2016-06-02 LAB — GLUCOSE, CAPILLARY
GLUCOSE-CAPILLARY: 403 mg/dL — AB (ref 65–99)
GLUCOSE-CAPILLARY: 375 mg/dL — AB (ref 65–99)
Glucose-Capillary: 292 mg/dL — ABNORMAL HIGH (ref 65–99)
Glucose-Capillary: 392 mg/dL — ABNORMAL HIGH (ref 65–99)
Glucose-Capillary: 319 mg/dL — ABNORMAL HIGH (ref 65–99)

## 2016-06-02 LAB — URIC ACID: Uric Acid, Serum: 7.9 mg/dL — ABNORMAL HIGH (ref 4.4–7.6)

## 2016-06-02 LAB — BASIC METABOLIC PANEL
ANION GAP: 7 (ref 5–15)
BUN: 30 mg/dL — ABNORMAL HIGH (ref 6–20)
CHLORIDE: 91 mmol/L — AB (ref 101–111)
CO2: 41 mmol/L — ABNORMAL HIGH (ref 22–32)
Calcium: 8.9 mg/dL (ref 8.9–10.3)
Creatinine, Ser: 1.29 mg/dL — ABNORMAL HIGH (ref 0.61–1.24)
GFR calc Af Amer: >60 mL/min (ref >60)
GFR, EST NON AFRICAN AMERICAN: 57 mL/min — AB (ref >60)
GLUCOSE: 417 mg/dL — AB (ref 65–99)
POTASSIUM: 4.6 mmol/L (ref 3.5–5.1)
Sodium: 139 mmol/L (ref 135–145)

## 2016-06-02 LAB — TROPONIN I
TROPONIN I: 0.04 ng/mL — AB (ref <0.03)
Troponin I: 0.03 ng/mL (ref <0.03)
Troponin I: 0.06 ng/mL (ref <0.03)

## 2016-06-02 LAB — ECHOCARDIOGRAM COMPLETE
HEIGHTINCHES: 71 in
Weight: 6631.44 oz

## 2016-06-02 LAB — MRSA PCR SCREENING: MRSA BY PCR: NEGATIVE

## 2016-06-02 LAB — SEDIMENTATION RATE: SED RATE: 99 mm/h — AB (ref 0–16)

## 2016-06-02 MED ORDER — FEBUXOSTAT 40 MG PO TABS
40.0000 mg | ORAL_TABLET | Freq: Every day | ORAL | Status: DC
  Administered 2016-06-02 – 2016-06-11 (×10): 40 mg via ORAL
  Filled 2016-06-02 (×10): qty 1

## 2016-06-02 MED ORDER — POTASSIUM CHLORIDE CRYS ER 20 MEQ PO TBCR
40.0000 meq | EXTENDED_RELEASE_TABLET | Freq: Every day | ORAL | Status: DC
  Administered 2016-06-02: 40 meq via ORAL
  Filled 2016-06-02 (×2): qty 2

## 2016-06-02 MED ORDER — ATORVASTATIN CALCIUM 40 MG PO TABS
40.0000 mg | ORAL_TABLET | Freq: Every day | ORAL | Status: DC
  Administered 2016-06-02 – 2016-06-11 (×10): 40 mg via ORAL
  Filled 2016-06-02 (×7): qty 1
  Filled 2016-06-02: qty 2
  Filled 2016-06-02 (×3): qty 1

## 2016-06-02 MED ORDER — ONDANSETRON HCL 4 MG/2ML IJ SOLN: 4.0000 mg | Freq: Four times a day (QID) | INTRAMUSCULAR | Status: DC | PRN

## 2016-06-02 MED ORDER — PREDNISONE 20 MG PO TABS
40.0000 mg | ORAL_TABLET | Freq: Every day | ORAL | Status: AC
Start: 2016-06-02 — End: 2016-06-04
  Administered 2016-06-02 – 2016-06-04 (×3): 40 mg via ORAL
  Filled 2016-06-02 (×4): qty 2

## 2016-06-02 MED ORDER — FUROSEMIDE 10 MG/ML IJ SOLN
80.0000 mg | Freq: Two times a day (BID) | INTRAMUSCULAR | Status: DC
  Administered 2016-06-02 – 2016-06-09 (×15): 80 mg via INTRAVENOUS
  Filled 2016-06-02 (×15): qty 8

## 2016-06-02 MED ORDER — SODIUM CHLORIDE 0.9% FLUSH
3.0000 mL | Freq: Two times a day (BID) | INTRAVENOUS | Status: DC
  Administered 2016-06-02 – 2016-06-11 (×14): 3 mL via INTRAVENOUS

## 2016-06-02 MED ORDER — CETYLPYRIDINIUM CHLORIDE 0.05 % MT LIQD
7.0000 mL | Freq: Two times a day (BID) | OROMUCOSAL | Status: DC
  Administered 2016-06-02 – 2016-06-11 (×13): 7 mL via OROMUCOSAL

## 2016-06-02 MED ORDER — INSULIN ASPART 100 UNIT/ML ~~LOC~~ SOLN
0.0000 [IU] | Freq: Three times a day (TID) | SUBCUTANEOUS | Status: DC
  Administered 2016-06-02 (×2): 20 [IU] via SUBCUTANEOUS
  Administered 2016-06-02: 15 [IU] via SUBCUTANEOUS
  Administered 2016-06-03: 11 [IU] via SUBCUTANEOUS
  Administered 2016-06-03: 4 [IU] via SUBCUTANEOUS
  Administered 2016-06-03: 20 [IU] via SUBCUTANEOUS
  Administered 2016-06-04 (×2): 4 [IU] via SUBCUTANEOUS
  Administered 2016-06-04: 20 [IU] via SUBCUTANEOUS
  Administered 2016-06-05 (×2): 4 [IU] via SUBCUTANEOUS
  Administered 2016-06-06: 7 [IU] via SUBCUTANEOUS
  Administered 2016-06-06: 3 [IU] via SUBCUTANEOUS
  Administered 2016-06-07: 4 [IU] via SUBCUTANEOUS
  Administered 2016-06-07: 7 [IU] via SUBCUTANEOUS
  Administered 2016-06-08: 3 [IU] via SUBCUTANEOUS
  Administered 2016-06-08 – 2016-06-09 (×3): 11 [IU] via SUBCUTANEOUS
  Administered 2016-06-09: 7 [IU] via SUBCUTANEOUS
  Administered 2016-06-09: 4 [IU] via SUBCUTANEOUS
  Administered 2016-06-10 (×2): 7 [IU] via SUBCUTANEOUS
  Administered 2016-06-10: 15 [IU] via SUBCUTANEOUS
  Administered 2016-06-11: 11 [IU] via SUBCUTANEOUS
  Administered 2016-06-11: 20 [IU] via SUBCUTANEOUS
  Administered 2016-06-11: 11 [IU] via SUBCUTANEOUS

## 2016-06-02 MED ORDER — FENOFIBRATE 54 MG PO TABS
54.0000 mg | ORAL_TABLET | Freq: Every day | ORAL | Status: DC
  Administered 2016-06-02 – 2016-06-11 (×10): 54 mg via ORAL
  Filled 2016-06-02 (×10): qty 1

## 2016-06-02 MED ORDER — METOLAZONE 5 MG PO TABS
2.5000 mg | ORAL_TABLET | Freq: Two times a day (BID) | ORAL | Status: DC
  Administered 2016-06-02: 2.5 mg via ORAL
  Filled 2016-06-02 (×2): qty 1

## 2016-06-02 MED ORDER — ASPIRIN EC 81 MG PO TBEC
81.0000 mg | DELAYED_RELEASE_TABLET | Freq: Every day | ORAL | Status: DC
  Administered 2016-06-02 – 2016-06-11 (×10): 81 mg via ORAL
  Filled 2016-06-02 (×10): qty 1

## 2016-06-02 MED ORDER — ENOXAPARIN SODIUM 100 MG/ML ~~LOC~~ SOLN
90.0000 mg | Freq: Every day | SUBCUTANEOUS | Status: DC
  Administered 2016-06-02 – 2016-06-11 (×10): 90 mg via SUBCUTANEOUS
  Filled 2016-06-02 (×10): qty 1

## 2016-06-02 MED ORDER — ONDANSETRON HCL 4 MG PO TABS: 4.0000 mg | ORAL_TABLET | Freq: Four times a day (QID) | ORAL | Status: DC | PRN

## 2016-06-02 MED ORDER — INSULIN ASPART 100 UNIT/ML ~~LOC~~ SOLN
0.0000 [IU] | Freq: Every day | SUBCUTANEOUS | Status: DC
  Administered 2016-06-02: 5 [IU] via SUBCUTANEOUS
  Administered 2016-06-02: 3 [IU] via SUBCUTANEOUS
  Administered 2016-06-03: 2 [IU] via SUBCUTANEOUS
  Administered 2016-06-04: 5 [IU] via SUBCUTANEOUS
  Administered 2016-06-07: 3 [IU] via SUBCUTANEOUS
  Administered 2016-06-08: 5 [IU] via SUBCUTANEOUS
  Administered 2016-06-09: 3 [IU] via SUBCUTANEOUS
  Administered 2016-06-10: 5 [IU] via SUBCUTANEOUS

## 2016-06-02 MED ORDER — COLCHICINE 0.6 MG PO TABS
0.6000 mg | ORAL_TABLET | Freq: Every day | ORAL | Status: DC
  Administered 2016-06-02 – 2016-06-11 (×10): 0.6 mg via ORAL
  Filled 2016-06-02 (×10): qty 1

## 2016-06-02 MED ORDER — INSULIN GLARGINE 100 UNIT/ML ~~LOC~~ SOLN
50.0000 [IU] | Freq: Two times a day (BID) | SUBCUTANEOUS | Status: DC
  Administered 2016-06-02 (×3): 50 [IU] via SUBCUTANEOUS
  Filled 2016-06-02 (×4): qty 0.5

## 2016-06-02 MED ORDER — INSULIN ASPART 100 UNIT/ML ~~LOC~~ SOLN
10.0000 [IU] | Freq: Three times a day (TID) | SUBCUTANEOUS | Status: DC
  Administered 2016-06-02 (×2): 10 [IU] via SUBCUTANEOUS

## 2016-06-02 MED ORDER — ACETAMINOPHEN 325 MG PO TABS
650.0000 mg | ORAL_TABLET | Freq: Four times a day (QID) | ORAL | Status: DC | PRN
  Administered 2016-06-02: 650 mg via ORAL
  Filled 2016-06-02: qty 2

## 2016-06-02 MED ORDER — ACETAMINOPHEN 650 MG RE SUPP
650.0000 mg | Freq: Four times a day (QID) | RECTAL | Status: DC | PRN
Start: 2016-06-02 — End: 2016-06-11

## 2016-06-02 MED ORDER — HYDROCODONE-ACETAMINOPHEN 7.5-325 MG PO TABS
1.0000 | ORAL_TABLET | ORAL | Status: DC | PRN
  Administered 2016-06-03 – 2016-06-05 (×2): 1 via ORAL
  Filled 2016-06-02 (×2): qty 1

## 2016-06-02 NOTE — Progress Notes (Signed)
Pt CBG 403, Md text paged.

## 2016-06-02 NOTE — Progress Notes (Signed)
Pt refusing L knee xray. Pt states it is his gout. MD aware.

## 2016-06-02 NOTE — Progress Notes (Signed)
*  PRELIMINARY RESULTS* Echocardiogram 2D Echocardiogram has been performed.  Antonio [name] 06/02/2016, 3:59 PM

## 2016-06-02 NOTE — Progress Notes (Signed)
Patient's 02 will drop down into the 80's from the low 90's (6L nasal cannula) with movement. 02 sat will come back in the low 90's. Pt asymptomatic. Will cont to monitor pt.

## 2016-06-02 NOTE — H&P (Signed)
History and Physical    Antonio [name] WUJ:[id number] DOB: 02-08-1951 DOA: 06/01/2016  PCP: [name], MD   Patient coming from: Home  Chief Complaint: Shortness of breath, weight gain, left knee pain  HPI: Antonio [name] is a 65 y.o. gentleman with a history of diastolic heart failure, HTN, HLD, DM, bioprosthetic aortic valve replacement, chronic respiratory failure with O2 dependence at baseline (4L [place]), and gout who presents to the ED complaining of progressive shortness of breath and DOE.  He reports a 16 lb weight gain in the past two weeks.  He complains of increased abdominal girth and increased lower extremity edema.  No chest pain.  He is also complaining of increased left knee pain and swelling, which he attributes to his known history of gout.  Despite his heart failure, he reports that he has been treated with prednisone for his acute flairs "for 15 years".  He just received a prescription for prednisone today, prior to presenting to the ED for his shortness of breath.  He is adamant that he receive this medication.  Surprisingly, he has never had a local corticosteroid injection to the affected knee.  ED Course: The patient required BiPAP upon arrival for acute hypoxia.  Chest xray showed cardiomegaly with interstitial pulmonary edema and small bilateral effusion.  BNP was normal.  First troponin 0.  The patient received lasix 80mg  IV one time with good response.  He has improved with BiPAP and has taken his mask off and is asking to eat and drink.  Hospitalist asked to admit for CHF exacerbation.  Review of Systems: As per HPI otherwise 10 point review of systems negative.    Past Medical History:  Diagnosis Date  . Anxiety   . Aortic stenosis    a. s/p AVR 11/29/11  . Arthritis   . Blindness of right eye    a. due to amblyopia as child  . Chronic diastolic heart failure (HCC)    a. Echo 08/11/2015 LVEF 55-60% with normal RV function  . DM2 (diabetes mellitus,  type 2) (HCC)   . Gout   . Hepatitis   . History of acute renal failure   . HLD (hyperlipidemia)   . HTN (hypertension)   . Hx MRSA infection    a. thigh abcess 2006  . OSA on CPAP     Past Surgical History:  Procedure Laterality Date  . AORTIC VALVE REPLACEMENT  11/29/2011   Procedure: AORTIC VALVE REPLACEMENT (AVR);  Surgeon: [name], MD;  Location: [facility] OR;  Service: Open Heart Surgery;  Laterality: N/A;  with nitric oxide  . BACK SURGERY    . CARDIAC CATHETERIZATION    . CARDIAC CATHETERIZATION N/A 10/14/2015   Procedure: Right Heart Cath;  Surgeon: [name], MD;  Location: [facility] INVASIVE CV LAB;  Service: Cardiovascular;  Laterality: N/A;  . CARDIAC VALVE REPLACEMENT    . INCISION AND DRAINAGE ABSCESS Left 08/2005   "upper/inner thigh; that's where the MRSA was"  . LUMBAR LAMINECTOMY  10/30/1983  . RIGHT HEART CATHETERIZATION N/A 11/28/2011   Procedure: RIGHT HEART CATH;  Surgeon: [name], MD;  Location: [facility] CATH LAB;  Service: Cardiovascular;  Laterality: N/A;     reports that he has never smoked. He has never used smokeless tobacco. He reports that he drinks alcohol. He reports that he does not use drugs.  He is divorced and reports that his two ex wives are his next of kin/emergency contacts.  Allergies  Allergen Reactions  . Other Other (See Comments)    Salt causes "extreme" edema.    Family History  Problem Relation Age of Onset  . Cancer Mother   . Heart attack Father      Prior to Admission medications   Medication Sig Start Date End Date Taking? Authorizing Provider  Ascorbic Acid (VITAMIN C) 100 MG tablet Take 100 mg by mouth daily.   Yes Historical Provider, MD  aspirin 81 MG tablet Take 81 mg by mouth daily.   Yes Historical Provider, MD  atorvastatin (LIPITOR) 40 MG tablet Take 40 mg by mouth daily at 6 PM.  12/05/14  Yes Historical Provider, MD  BAYER MICROLET LANCETS lancets As directed   Yes Historical Provider, MD  BD PEN  NEEDLE NANO U/F 32G X 4 MM MISC As directed 04/09/16  Yes Historical Provider, MD  Coenzyme Q10 (CO Q-10 PO) Take 2 capsules by mouth daily.   Yes Historical Provider, MD  colchicine 0.6 MG tablet Take 1 tablet (0.6 mg total) by mouth daily. 06/01/16  Yes Ellsworth Lennox, PA  febuxostat (ULORIC) 40 MG tablet Take 40 mg by mouth daily.    Yes Historical Provider, MD  fenofibrate 54 MG tablet Take 54 mg by mouth daily. 05/04/15  Yes Historical Provider, MD  fluticasone (FLONASE) 50 MCG/ACT nasal spray Place 1 spray into both nostrils daily. Patient taking differently: Place 1 spray into both nostrils daily as needed for allergies.  01/09/16  Yes [name], MD  glucose blood (BAYER CONTOUR NEXT TEST) test strip As directed 05/29/16  Yes Historical Provider, MD  hydrocerin (EUCERIN) CREA Apply 1 application topically 2 (two) times daily. 09/19/15  Yes [name], PA-C  HYDROcodone-acetaminophen (NORCO) 7.5-325 MG tablet Take 1 tablet by mouth every 4 (four) hours as needed for moderate pain. 01/09/16  Yes [name], MD  Insulin Glargine (BASAGLAR KWIKPEN) 100 UNIT/ML SOPN Inject 50 Units into the skin 2 (two) times daily.  05/17/16  Yes Historical Provider, MD  lidocaine (LINDAMANTLE) 3 % CREA cream Apply 1 application topically 3 (three) times daily as needed (rash).   Yes Historical Provider, MD  NOVOLOG 100 UNIT/ML injection Inject 30-40 Units into the skin 3 (three) times daily with meals. Sliding scale 05/12/15  Yes Historical Provider, MD  nystatin (MYCOSTATIN/NYSTOP) powder As directed to affected areas 01/24/16  Yes Historical Provider, MD  Omega-3 Fatty Acids (FISH OIL) 1200 MG CAPS Take 1,200 mg by mouth daily.   Yes Historical Provider, MD  OXYGEN Inhale 4 L into the lungs at bedtime.   Yes Historical Provider, MD  potassium chloride SA (K-DUR,KLOR-CON) 20 MEQ tablet Take 40 meq (2 tablets) in the morning and 20 meq (1 tablet) in the evening.  Take 1 extra tab when you take Metolazone 03/27/16   Yes [name], MD  Saccharomyces boulardii (PROBIOTIC) 250 MG CAPS Take 250 mg by mouth 2 (two) times daily.   Yes Historical Provider, MD  torsemide (DEMADEX) 20 MG tablet Take 4 tablets (80 mg total) by mouth 2 (two) times daily. 05/02/16  Yes [name], MD  predniSONE (DELTASONE) 20 MG tablet Take 2 tablets (  total) by mouth once daily for two days, then 1 tablet ( ) once daily for two days. 06/01/16   Ellsworth Lennox, PA  sodium chloride (OCEAN) 0.65 % SOLN nasal spray Place 1 spray into both nostrils as needed for congestion. Patient not taking: Reported on 03/06/2016 01/09/16   [name],  MD    Physical Exam: Vitals:   06/01/16 2200 06/01/16 2245 06/01/16 2315 06/02/16 0000  BP: 126/62 114/56 133/82 (!) 118/100  Pulse: 101 102 106 (!) 106  Resp: Temp:      TempSrc:      SpO2: 100% 90% 91% 93%  Weight:          Constitutional: NAD, calm, comfortable Vitals:   06/01/16 2200 06/01/16 2245 06/01/16 2315 06/02/16 0000  BP: 126/62 114/56 133/82 (!) 118/100  Pulse: 101 102 106 (!) 106  Resp: Temp:      TempSrc:      SpO2: 100% 90% 91% 93%  Weight:       Eyes: PERRL, lids and conjunctivae normal ENMT: Mucous membranes are dry. Posterior pharynx clear of any exudate or lesions. Normal dentition.  Neck: normal appearance, supple Respiratory: Coarse bilaterally but no wheeze.  Normal respiratory effort. No accessory muscle use.  Cardiovascular: Mildly tachycardic but regular.  1-2+ extremity edema bilaterally, right greater than left. 2+ pedal pulses.  GI: Super obese abdomen is protuberant but without tenderness.  Bowel sounds are present. Musculoskeletal:  Left knee is markedly swollen compared to right and exquisitely tender to touch.  Decreased ROM in both legs.  No contractures. Normal muscle tone.  Skin: Pale, cool, multiple scars/bruises/scales/eschar on feet Neurologic: No focal deficits Psychiatric: Normal judgment and insight.  Alert and oriented x 3. Normal mood.     Labs on Admission: I have personally reviewed following labs and imaging studies  CBC:  Recent Labs Lab 06/01/16 2054  WBC 13.7*  NEUTROABS 10.0*  HGB 11.5*  HCT 40.0  MCV 87.7  PLT 270   Basic Metabolic Panel:  Recent Labs Lab 06/01/16 2054  NA 137  K 4.5  CL 89*  CO2 41*  GLUCOSE 346*  BUN 31*  CREATININE 1.40*  CALCIUM 9.1   GFR: Estimated Creatinine Clearance: 88.8 mL/min (by C-G formula based on SCr of 1.4 mg/dL). Liver Function Tests:  Recent Labs Lab 06/01/16 2054  AST 27  ALT 19  ALKPHOS 102  BILITOT 0.7  PROT 7.1  ALBUMIN 3.1*   Cardiac Enzymes:  Recent Labs Lab 06/02/16 0019  TROPONINI 0.03*   CBG:  Recent Labs Lab 06/02/16 0100  GLUCAP 292*   Urine analysis:    Component Value Date/Time   COLORURINE YELLOW 02/23/2016 0525   APPEARANCEUR TURBID (A) 02/23/2016 0525   LABSPEC 1.013 02/23/2016 0525   PHURINE 5.5 02/23/2016 0525   GLUCOSEU NEGATIVE 02/23/2016 0525   HGBUR SMALL (A) 02/23/2016 0525   HGBUR negative 06/12/2007 0843   BILIRUBINUR NEGATIVE 02/23/2016 0525   KETONESUR NEGATIVE 02/23/2016 0525   PROTEINUR 30 (A) 02/23/2016 0525   UROBILINOGEN 0.2 06/15/2015 0029   NITRITE NEGATIVE 02/23/2016 0525   LEUKOCYTESUR TRACE (A) 02/23/2016 0525    Radiological Exams on Admission: Dg Chest Portable 1 View  Result Date: 06/01/2016 CLINICAL DATA:  Patient with shortness of breath. EXAM: PORTABLE CHEST 1 VIEW COMPARISON:  Chest radiograph 02/23/2016 FINDINGS: Low lung volumes. Marked cardiomegaly. Monitoring leads overlie the patient. Status post median sternotomy. Pulmonary vascular redistribution and new bilateral interstitial pulmonary opacities. Probable small bilateral pleural effusions. IMPRESSION: Cardiomegaly and interstitial pulmonary edema with small bilateral pleural effusions. Markedly low lung volumes. Electronically Signed   By: Annia Belt M.D.   On: 06/01/2016 21:25    EKG:  Independently reviewed. Sinus tachycardia.  RBBB (not new).  Assessment/Plan Principal Problem:  CHF (congestive heart failure) (HCC) Active Problems:   Gout   OBESITY, NOS   EDEMA-LEGS,DUE TO VENOUS OBSTRUCT.   S/P aortic valve replacement with bioprosthetic valve   Acute exacerbation of CHF (congestive heart failure) (HCC)      Acute exacerbation of chronic diastolic heart failure --Admit to telemetry, stepdown unit (high risk for needing intermittent BiPAP therapy) --Diuresis with IV lasix --1200cc fluid restriction --CHF education --Repeat complete echo in the AM --Serial troponin --Wean oxygen to baseline of 3L [place] as tolerated --Consider consult to CHF team in the AM  Acute gout flare in left knee --Prednisone 40mg  daily x 3 days (patient demands to have this medication) --Continue home doses of colchicine and uloric --Knee xray pending --Consider routine ortho consult in the AM for arthrocentesis and intra-articular corticosteroid injection while he is here for CHF  DM --Continue home dose of lantus --Aggressive sliding scale due to obesity and steroid use     DVT prophylaxis: Lovenox Code Status: FULL Family Communication: Patient alone at time of admission Disposition Plan: Expect he will go home at discharge Consults called: NONE Admission status: Inpatient, stepdown unit  TIME SPENT: 70 minutes   Jerene Bears MD [facility] Pager [phone]  If 7PM-7AM, please contact night-coverage [email or web address] Password [email or web address]  06/02/2016, 1:30 AM

## 2016-06-02 NOTE — Progress Notes (Signed)
[facility]                                                                              Patient Demographics  Antonio [name], is a 65 y.o. male, DOB - 1951-04-12, CWC:[id number]  Admit date - 06/01/2016   Admitting Physician Lily Kocher, MD  Outpatient Primary MD for the patient is [name], MD  Outpatient specialists:   LOS - 1  days    Chief Complaint  Patient presents with  . Shortness of Breath       Brief summary   Antonio [name] is a 65 y.o. gentleman with a history of diastolic heart failure, HTN, HLD, DM, bioprosthetic aortic valve replacement, chronic respiratory failure with O2 dependence at baseline (4L [place]), and gout who presents to the ED complaining of progressive shortness of breath and DOE.  He reported 16 lb weight gain in the past two weeks.  He complains of increased abdominal girth and increased lower extremity edema.  No chest pain. He is also complaining of increased left knee pain and swelling, which he attributes to his known history of gout.  Despite his heart failure, he reports that he has been treated with prednisone for his acute flairs "for 15 years".  He just received a prescription for prednisone today, prior to presenting to the ED for his shortness of breath.  He is adamant that he receive this medication.  Surprisingly, he has never had a local corticosteroid injection to the affected knee. ED Course: The patient required BiPAP upon arrival for acute hypoxia.  Chest xray showed cardiomegaly with interstitial pulmonary edema and small bilateral effusion.  BNP was normal.  First troponin 0.  The patient received lasix '80mg'$  IV one time with good response.  He has improved with BiPAP and has taken his mask off and is asking to eat and drink.  Hospitalist asked to admit for CHF exacerbation.   Assessment & Plan    Acute exacerbation of chronic diastolic heart failure - Overnight placed on BiPAP, currently off - Currently on  aggressive IV diuresis 80 mg every 12 hours, negative balance of 2.1 L. Weight 414 lbs (per patient his dry weight is 360-370's lbs) - Outpatient he is on torsemide 80 mg twice a day - Consulted cardiology, will follow recommendations    Acute gout flare in left knee - Patient refused right knee x-ray, continue prednisone x 3days, colchicine and uloric --Follow ESR, CRP, uric acid  DM --Continue home dose of lantus --Aggressive sliding scale due to obesity and steroid use  Morbid obesity - Patient counseled on diet and weight control  Hypertension -BP currently stable   Chronic kidney disease stage 3 - Baseline creatinine 1.1-2 - Follow closely with diuresis  OSA on CPAP  Code Status:Full code DVT Prophylaxis:  Lovenox Family Communication: Discussed in detail with the patient, all imaging results, lab results explained to the patient  Disposition Plan:   Time Spent in minutes   25 minutes  Procedures:  CXR  Consultants:   cardiology  Antimicrobials:      Medications  Scheduled Meds: . antiseptic oral rinse  7 mL Mouth Rinse BID  . aspirin EC  81 mg Oral Daily  . atorvastatin  40 mg Oral q1800  . colchicine  0.6 mg Oral Daily  . enoxaparin (LOVENOX) injection  90 mg Subcutaneous Daily  . febuxostat  40 mg Oral Daily  . fenofibrate  54 mg Oral Daily  . furosemide  80 mg Intravenous Q12H  . insulin aspart  0-20 Units Subcutaneous TID WC  . insulin aspart  0-5 Units Subcutaneous QHS  . insulin glargine  50 Units Subcutaneous BID  . potassium chloride SA  40 mEq Oral Daily  . predniSONE  40 mg Oral Q breakfast  . sodium chloride flush  3 mL Intravenous Q12H   Continuous Infusions:  PRN Meds:.acetaminophen **OR** acetaminophen, HYDROcodone-acetaminophen, ondansetron **OR** ondansetron (ZOFRAN) IV   Antibiotics   Anti-infectives    None        Subjective:   Antonio [name] was seen and examined today. Left knee hurting otherwise shortness of  breath is improving. No fevers or chills. Patient denies dizziness, chest pain, abdominal pain, N/V/D/C, new weakness, numbess, tingling. No acute events overnight.    Objective:   Vitals:   06/02/16 0000 06/02/16 0400 06/02/16 0500 06/02/16 0800  BP: (!) 118/100 135/79 100/77 131/66  Pulse: (!) 106 (!) 104 (!) 104 (!) 110  Resp: '18  18 20  '$ Temp: 98.6 F (37 C) 98.1 F (36.7 C) 98.7 F (37.1 C) 98.3 F (36.8 C)  TempSrc: Oral Oral Oral Oral  SpO2: 93% 94% 92%   Weight:   (!) 188 kg (414 lb 7.4 oz)   Height:   '5\' 11"'$  (1.803 m)     Intake/Output Summary (Last 24 hours) at 06/02/16 1051 Last data filed at 06/02/16 1034  Gross per 24 hour  Intake              240 ml  Output             2400 ml  Net            -2160 ml     Wt Readings from Last 3 Encounters:  06/02/16 (!) 188 kg (414 lb 7.4 oz)  05/03/16 (!) 176 kg (388 lb)  04/03/16 (!) 174.4 kg (384 lb 8 oz)     Exam  General: Alert and oriented x 3, NAD  HEENT:    Neck: Supple, + JVD  Cardiovascular: S1 S2 auscultated, no rubs, murmurs or gallops. Regular rate and rhythm.  Respiratory: Decreased breath sounds at the bases   Gastrointestinal: Morbidly obese Soft, nontender, nondistended, + bowel sounds  Ext: no cyanosis clubbing, 2+ edema with right LE fluid blisters   Neuro: AAOx3, Cr N's II- XII. Strength 5/5 upper and lower extremities bilaterally  Skin: No rashes  Psych: Normal affect and demeanor, alert and oriented x3    Data Reviewed:  I have personally reviewed following labs and imaging studies  Micro Results Recent Results (from the past 240 hour(s))  MRSA PCR Screening     Status: None   Collection Time: 06/02/16 12:02 AM  Result Value Ref Range Status   MRSA by PCR NEGATIVE NEGATIVE Final    Comment:        The GeneXpert MRSA Assay (FDA approved for NASAL specimens only), is one component of a comprehensive MRSA colonization surveillance program. It is not intended to diagnose  MRSA infection nor to guide or monitor treatment for MRSA infections.     Radiology Reports Dg Chest Portable  1 View  Result Date: 06/01/2016 CLINICAL DATA:  Patient with shortness of breath. EXAM: PORTABLE CHEST 1 VIEW COMPARISON:  Chest radiograph 02/23/2016 FINDINGS: Low lung volumes. Marked cardiomegaly. Monitoring leads overlie the patient. Status post median sternotomy. Pulmonary vascular redistribution and new bilateral interstitial pulmonary opacities. Probable small bilateral pleural effusions. IMPRESSION: Cardiomegaly and interstitial pulmonary edema with small bilateral pleural effusions. Markedly low lung volumes. Electronically Signed   By: Lovey Newcomer M.D.   On: 06/01/2016 21:25    Lab Data:  CBC:  Recent Labs Lab 06/01/16 2054  WBC 13.7*  NEUTROABS 10.0*  HGB 11.5*  HCT 40.0  MCV 87.7  PLT 825   Basic Metabolic Panel:  Recent Labs Lab 06/01/16 2054  NA 137  K 4.5  CL 89*  CO2 41*  GLUCOSE 346*  BUN 31*  CREATININE 1.40*  CALCIUM 9.1   GFR: Estimated Creatinine Clearance: 89.6 mL/min (by C-G formula based on SCr of 1.4 mg/dL). Liver Function Tests:  Recent Labs Lab 06/01/16 2054  AST 27  ALT 19  ALKPHOS 102  BILITOT 0.7  PROT 7.1  ALBUMIN 3.1*   No results for input(s): LIPASE, AMYLASE in the last 168 hours. No results for input(s): AMMONIA in the last 168 hours. Coagulation Profile: No results for input(s): INR, PROTIME in the last 168 hours. Cardiac Enzymes:  Recent Labs Lab 06/02/16 0019  TROPONINI 0.03*   BNP (last 3 results) No results for input(s): PROBNP in the last 8760 hours. HbA1C: No results for input(s): HGBA1C in the last 72 hours. CBG:  Recent Labs Lab 06/02/16 0100 06/02/16 0832  GLUCAP 292* 392*   Lipid Profile: No results for input(s): CHOL, HDL, LDLCALC, TRIG, CHOLHDL, LDLDIRECT in the last 72 hours. Thyroid Function Tests: No results for input(s): TSH, T4TOTAL, FREET4, T3FREE, THYROIDAB in the last 72  hours. Anemia Panel: No results for input(s): VITAMINB12, FOLATE, FERRITIN, TIBC, IRON, RETICCTPCT in the last 72 hours. Urine analysis:    Component Value Date/Time   COLORURINE YELLOW 02/23/2016 0525   APPEARANCEUR TURBID (A) 02/23/2016 0525   LABSPEC 1.013 02/23/2016 0525   PHURINE 5.5 02/23/2016 0525   GLUCOSEU NEGATIVE 02/23/2016 0525   HGBUR SMALL (A) 02/23/2016 0525   HGBUR negative 06/12/2007 0843   BILIRUBINUR NEGATIVE 02/23/2016 0525   KETONESUR NEGATIVE 02/23/2016 0525   PROTEINUR 30 (A) 02/23/2016 0525   UROBILINOGEN 0.2 06/15/2015 0029   NITRITE NEGATIVE 02/23/2016 0525   LEUKOCYTESUR TRACE (A) 02/23/2016 0539     Tekeisha Hakim M.D. [facility] 06/02/2016, 10:51 AM  Pager: [phone] Between 7am to 7pm - call Pager - [phone]  After 7pm go to [email or web address] - password [email or web address]  Call night coverage person covering after 7pm

## 2016-06-02 NOTE — Progress Notes (Signed)
RT went to apply the Bipap  On patient and patient stated that he would just wear the oxygen because he didn't want to wear the V60.  RT spoke with RN and we will continue to monitor patient.

## 2016-06-02 NOTE — Consult Note (Addendum)
Advanced Heart Failure Team Consult Note  Referring Physician: Dr Blake Divine Primary Physician: Dr Leonette Most [facility]) Primary Cardiologist:  Dr. Shirlee Latch   Reason for Consultation: Chronic diastolic HF/AKI  HPI:    65 yo male with morbid obesity, DM2, severe AS s/p bioprosthetic AVR, chronic diastolic CHF, chronic respiratory failure on home O2, gout and CKD whom are asked to see by Dr. Isidoro Donning for recurrent HF with marked volume overload.   He has multiple hospitalizations due to non-compliance. Baseline weight about 380. Admitted due to increased SOB and volume overload and gout pain. Says he has been eating a lot of fruit.  Weight up to 414 pounds. Sugars have also been high. Only moderate response to IV lasix.    Review of Systems: [y] = yes,  = no   General: Weight gain [y]; Weight loss ; Anorexia ; Fatigue [place] ]; Fever ; Chills ; Weakness   Cardiac: Chest pain/pressure ; Resting SOB ; Exertional SOB ; Orthopnea [y]; Pedal Edema [y]; Palpitations ; Syncope ; Presyncope ; Paroxysmal nocturnal dyspnea[ ]   Pulmonary: Cough ; Wheezing[ ] ; Hemoptysis[ ] ; Sputum ; Snoring   GI: Vomiting[ ] ; Dysphagia[ ] ; Melena[ ] ; Hematochezia ; Heartburn[ ] ; Abdominal pain ; Constipation ; Diarrhea ; BRBPR   GU: Hematuria[ ] ; Dysuria ; Nocturia[ ]   Vascular: Pain in legs with walking [ y]; Pain in feet with lying flat ; Non-healing sores ; Stroke ; TIA ; Slurred speech ;  Neuro: Headaches[ ] ; Vertigo[ ] ; Seizures[ ] ; Paresthesias[ ] ;Blurred vision ; Diplopia ; Vision changes   Ortho/Skin: Arthritis [place] ]; Joint pain [y]; Muscle pain ; Joint swelling ; Back Pain ; Rash   Psych: Depression[ ] ; Anxiety[ ]   Heme: Bleeding problems ; Clotting disorders ; Anemia   Endocrine: Diabetes [place] ]; Thyroid dysfunction[ ]   Home Medications Prior to Admission medications   Medication Sig Start Date End Date Taking?  Authorizing Provider  aspirin 81 MG tablet Take 81 mg by mouth daily.   Yes Historical Provider, MD  atorvastatin (LIPITOR) 40 MG tablet Take 40 mg by mouth daily at 6 PM.  12/05/14  Yes Historical Provider, MD  colchicine 0.6 MG tablet Take 0.6 mg by mouth daily.  09/01/14  Yes Historical Provider, MD  febuxostat (ULORIC) 40 MG tablet Take 40 mg by mouth daily.    Yes Historical Provider, MD  fenofibrate 54 MG tablet Take 54 mg by mouth daily. 05/04/15  Yes Historical Provider, MD  fluticasone (FLONASE) 50 MCG/ACT nasal spray Place 1 spray into both nostrils daily. 01/09/16  Yes [name], MD  hydrocerin (EUCERIN) CREA Apply 1 application topically 2 (two) times daily. 09/19/15  Yes [name], PA-C  HYDROcodone-acetaminophen (NORCO) 7.5-325 MG tablet Take 1 tablet by mouth every 4 (four) hours as needed for moderate pain. 01/09/16  Yes [name], MD  hydrOXYzine (ATARAX/VISTARIL) 25 MG tablet Take 25 mg by mouth every 6 (six) hours as needed for itching.   Yes Historical Provider, MD  insulin detemir (LEVEMIR) 100 UNIT/ML injection 25units qam and 30 units qhs Patient taking differently: Inject 30-35 Units into the skin 2 (two) times daily. 30units qam and 35 units qhs 01/09/16  Yes [name], MD  lidocaine (LINDAMANTLE) 3 % CREA cream Apply 1 application topically 3 (three) times  daily as needed (rash).   Yes Historical Provider, MD  NOVOLOG 100 UNIT/ML injection Inject 30-40 Units into the skin 3 (three) times daily with meals. Sliding scale 05/12/15  Yes Historical Provider, MD  OXYGEN Inhale 4 L into the lungs at bedtime.   Yes Historical Provider, MD  potassium chloride SA (K-DUR,KLOR-CON) 20 MEQ tablet Take 2 tablets (40 mEq total) by mouth daily. Take 3 tabs in AM and 2 tabs in PM Patient taking differently: Take 40 mEq by mouth 2 (two) times daily.  01/09/16  Yes [name], MD  sodium chloride (OCEAN) 0.65 % SOLN nasal spray Place 1 spray into both nostrils as needed for congestion. 01/09/16  Yes  [name], MD  torsemide (DEMADEX) 20 MG tablet Take 4 tablets (80 mg total) by mouth 2 (two) times daily. 01/09/16  Yes [name], MD    Past Medical History: Past Medical History:  Diagnosis Date  . Anxiety   . Aortic stenosis    a. s/p AVR 11/29/11  . Arthritis   . Blindness of right eye    a. due to amblyopia as child  . Chronic diastolic heart failure (HCC)    a. Echo 08/11/2015 LVEF 55-60% with normal RV function  . DM2 (diabetes mellitus, type 2) (HCC)   . Gout   . Hepatitis   . History of acute renal failure   . HLD (hyperlipidemia)   . HTN (hypertension)   . Hx MRSA infection    a. thigh abcess 2006  . OSA on CPAP     Past Surgical History: Past Surgical History:  Procedure Laterality Date  . AORTIC VALVE REPLACEMENT  11/29/2011   Procedure: AORTIC VALVE REPLACEMENT (AVR);  Surgeon: [name], MD;  Location: [facility] OR;  Service: Open Heart Surgery;  Laterality: N/A;  with nitric oxide  . BACK SURGERY    . CARDIAC CATHETERIZATION    . CARDIAC CATHETERIZATION N/A 10/14/2015   Procedure: Right Heart Cath;  Surgeon: [name], MD;  Location: [facility] INVASIVE CV LAB;  Service: Cardiovascular;  Laterality: N/A;  . CARDIAC VALVE REPLACEMENT    . INCISION AND DRAINAGE ABSCESS Left 08/2005   "upper/inner thigh; that's where the MRSA was"  . LUMBAR LAMINECTOMY  10/30/1983  . RIGHT HEART CATHETERIZATION N/A 11/28/2011   Procedure: RIGHT HEART CATH;  Surgeon: [name], MD;  Location: [facility] CATH LAB;  Service: Cardiovascular;  Laterality: N/A;    Family History: Family History  Problem Relation Age of Onset  . Cancer Mother   . Heart attack Father     Social History: Social History   Social History  . Marital status: Divorced    Spouse name: N/A  . Number of children: N/A  . Years of education: N/A   Occupational History  . retired travelling Engineer, site    Social History Main Topics  . Smoking status: Never Smoker  . Smokeless tobacco: Never  Used  . Alcohol use Yes     Comment: 02/18/2015 "once or twice/yr I'll have a cocktail"      rare  . Drug use: No  . Sexual activity: Not Currently   Other Topics Concern  . None   Social History Narrative  . None    Allergies:  Allergies  Allergen Reactions  . Other Other (See Comments)    Salt causes "extreme" edema.    Objective:    Vital Signs:   Temp:  [97.6 F (36.4 C)-99.4 F (37.4 C)] 97.6 F (  36.4 C) (08/05 1311) Pulse Rate:  [99-110] 105 (08/05 1311) Resp:  [18-29] 29 (08/05 1311) BP: (100-153)/(56-100) 150/86 (08/05 1311) SpO2:  [90 %-100 %] 93 % (08/05 1311) FiO2 (%):  [80 %] 80 % (08/04 2044) Weight:  [185.5 kg (409 lb)-188 kg (414 lb 7.4 oz)] 188 kg (414 lb 7.4 oz) (08/05 0500)    Weight change: Filed Weights   06/01/16 2046 06/02/16 0500  Weight: (!) 185.5 kg (409 lb) (!) 188 kg (414 lb 7.4 oz)    Intake/Output:   Intake/Output Summary (Last 24 hours) at 06/02/16 1423 Last data filed at 06/02/16 1321  Gross per 24 hour  Intake              240 ml  Output             2875 ml  Net            -2635 ml     Physical Exam: General:  Obese male sitting in bed NAD HEENT: normal Neck: supple. JVP difficult to assess due to neck girth . Carotids 2+ bilat; no bruits. No lymphadenopathy or thyromegaly appreciated. Cor: PMI nondisplaced. Regular rate & rhythm. No rubs, gallops or murmurs. Lungs: clear Abdomen: Morbidly obese, soft, NT, ND, Unable to assess for  hepatosplenomegaly. No bruits or masses. Good bowel sounds. Extremities: no cyanosis, clubbing, rash. 2-3+ edema Neuro: alert & orientedx3, cranial nerves grossly intact. moves all 4 extremities w/o difficulty. Affect pleasant  Telemetry: NSR  Labs: Basic Metabolic Panel:  Recent Labs Lab 06/01/16 2054 06/02/16 1059  NA 137 139  K 4.5 4.6  CL 89* 91*  CO2 41* 41*  GLUCOSE 346* 417*  BUN 31* 30*  CREATININE 1.40* 1.29*  CALCIUM 9.1 8.9    Liver Function Tests:  Recent Labs Lab  06/01/16 2054  AST 27  ALT 19  ALKPHOS 102  BILITOT 0.7  PROT 7.1  ALBUMIN 3.1*   No results for input(s): LIPASE, AMYLASE in the last 168 hours. No results for input(s): AMMONIA in the last 168 hours.  CBC:  Recent Labs Lab 06/01/16 2054 06/02/16 1059  WBC 13.7* 11.9*  NEUTROABS 10.0*  --   HGB 11.5* 10.9*  HCT 40.0 37.4*  MCV 87.7 87.6  PLT 270 252    Cardiac Enzymes:  Recent Labs Lab 06/02/16 0019 06/02/16 1059  TROPONINI 0.03* 0.06*    BNP: BNP (last 3 results)  Recent Labs  02/08/16 1513 04/03/16 1252 06/01/16 2054  BNP 29.9 23.9 56.7    ProBNP (last 3 results) No results for input(s): PROBNP in the last 8760 hours.   CBG:  Recent Labs Lab 06/02/16 0100 06/02/16 0832 06/02/16 1128  GLUCAP 292* 392* 403*    Coagulation Studies: No results for input(s): LABPROT, INR in the last 72 hours.  Other results: EKG: Stach 105 RBBBB  Imaging: Dg Chest Portable 1 View  Result Date: 06/01/2016 CLINICAL DATA:  Patient with shortness of breath. EXAM: PORTABLE CHEST 1 VIEW COMPARISON:  Chest radiograph 02/23/2016 FINDINGS: Low lung volumes. Marked cardiomegaly. Monitoring leads overlie the patient. Status post median sternotomy. Pulmonary vascular redistribution and new bilateral interstitial pulmonary opacities. Probable small bilateral pleural effusions. IMPRESSION: Cardiomegaly and interstitial pulmonary edema with small bilateral pleural effusions. Markedly low lung volumes. Electronically Signed   By: Annia Belt M.D.   On: 06/01/2016 21:25     Medications:     Current Medications: . antiseptic oral rinse  7 mL Mouth Rinse BID  . aspirin EC  81 mg Oral Daily  . atorvastatin  40 mg Oral q1800  . colchicine  0.6 mg Oral Daily  . enoxaparin (LOVENOX) injection  90 mg Subcutaneous Daily  . febuxostat  40 mg Oral Daily  . fenofibrate  54 mg Oral Daily  . furosemide  80 mg Intravenous Q12H  . insulin aspart  0-20 Units Subcutaneous TID WC  .  insulin aspart  0-5 Units Subcutaneous QHS  . insulin aspart  10 Units Subcutaneous TID WC  . insulin glargine  50 Units Subcutaneous BID  . potassium chloride SA  40 mEq Oral Daily  . predniSONE  40 mg Oral Q breakfast  . sodium chloride flush  3 mL Intravenous Q12H    Infusions:     Assessment   1. Acute on Chronic Diastolic HF 2. AKI on CKD III  3. Bioprosthetic aortic valve 4. Morbid Obesity 5. OSA 6. DM2, poorly controlled  7. HTN 8. Acute Gout 9. Chronic respiratory failure on home O2 10. RBBB  Plan    He is markedly volume overloaded in setting of dietary indiscretion. Agree with IV lasix. Will add metolazone as needed. Watch renal function and electrolytes. Agree with short burst of prednisone for gout.   Primary team addressing DM2.  Length of Stay: 1  Karey Suthers MD 06/02/2016, 2:23 PM  Advanced Heart Failure Team Pager [phone] (M-F; 7a - 4p)  Please contact [facility] for night-coverage after hours (4p -7a ) and weekends on [email or web address]

## 2016-06-02 NOTE — Progress Notes (Signed)
CRITICAL VALUE ALERT  Critical value received:  Troponin=0.03  Date of notification:  06/02/16  Time of notification:  01:00  Critical value read back:Yes.    Nurse who received alert:  Sander Radon RN  MD notified (1st page):  Dr. Fanny Bien  Time of first page:  02:40  MD notified (2nd page):  Time of second page:  Responding MD:  Dr. Montez Morita  Time MD responded:  02:41

## 2016-06-03 DIAGNOSIS — M25569 Pain in unspecified knee: Secondary | ICD-10-CM

## 2016-06-03 DIAGNOSIS — I871 Compression of vein: Secondary | ICD-10-CM

## 2016-06-03 DIAGNOSIS — M25562 Pain in left knee: Secondary | ICD-10-CM

## 2016-06-03 LAB — GLUCOSE, CAPILLARY
GLUCOSE-CAPILLARY: 387 mg/dL — AB (ref 65–99)
GLUCOSE-CAPILLARY: 154 mg/dL — AB (ref 65–99)
Glucose-Capillary: 266 mg/dL — ABNORMAL HIGH (ref 65–99)
Glucose-Capillary: 220 mg/dL — ABNORMAL HIGH (ref 65–99)

## 2016-06-03 LAB — BASIC METABOLIC PANEL
Anion gap: 8 (ref 5–15)
BUN: 33 mg/dL — ABNORMAL HIGH (ref 6–20)
CALCIUM: 9 mg/dL (ref 8.9–10.3)
CHLORIDE: 87 mmol/L — AB (ref 101–111)
CO2: 43 mmol/L — ABNORMAL HIGH (ref 22–32)
CREATININE: 1.36 mg/dL — AB (ref 0.61–1.24)
GFR, EST NON AFRICAN AMERICAN: 53 mL/min — AB (ref >60)
Glucose, Bld: 437 mg/dL — ABNORMAL HIGH (ref 65–99)
Potassium: 4.8 mmol/L (ref 3.5–5.1)
SODIUM: 138 mmol/L (ref 135–145)

## 2016-06-03 MED ORDER — POTASSIUM CHLORIDE CRYS ER 20 MEQ PO TBCR
20.0000 meq | EXTENDED_RELEASE_TABLET | Freq: Every day | ORAL | Status: DC
  Administered 2016-06-03 – 2016-06-07 (×5): 20 meq via ORAL
  Filled 2016-06-03 (×4): qty 1

## 2016-06-03 MED ORDER — INSULIN ASPART 100 UNIT/ML ~~LOC~~ SOLN
15.0000 [IU] | Freq: Three times a day (TID) | SUBCUTANEOUS | Status: DC
  Administered 2016-06-03 – 2016-06-06 (×10): 15 [IU] via SUBCUTANEOUS

## 2016-06-03 MED ORDER — INSULIN GLARGINE 100 UNIT/ML ~~LOC~~ SOLN
60.0000 [IU] | Freq: Two times a day (BID) | SUBCUTANEOUS | Status: DC
  Administered 2016-06-03 – 2016-06-06 (×6): 60 [IU] via SUBCUTANEOUS
  Filled 2016-06-03 (×9): qty 0.6

## 2016-06-03 MED ORDER — METOLAZONE 5 MG PO TABS
5.0000 mg | ORAL_TABLET | Freq: Two times a day (BID) | ORAL | Status: DC
  Administered 2016-06-03 – 2016-06-09 (×13): 5 mg via ORAL
  Filled 2016-06-03 (×12): qty 1

## 2016-06-03 NOTE — Consult Note (Signed)
Advanced Heart Failure Team Progress Note  Referring Physician: Dr Karleen Hampshire Primary Physician: Dr Jeralene Huff [facility]) Primary Cardiologist:  Dr. Aundra Dubin     Subjective   65 yo male with morbid obesity, DM2, severe AS s/p bioprosthetic AVR, chronic diastolic CHF, chronic respiratory failure on home O2, gout and CKD admitted for recurrent HF and gout  Modest diuresis with IV lasix and metolazone. Weight unchanged (414). Baseline weight about 380. On steroids for gout. (ESR 99). Still SOB. Refused BIPAP last night.    Objective:    Vital Signs:   Temp:  [97.6 F (36.4 C)-98 F (36.7 C)] 97.8 F (36.6 C) (08/06 0421) Pulse Rate:  [98-105] 98 (08/06 0421) Resp:  [23-29] 26 (08/06 0421) BP: (134-150)/(75-90) 145/90 (08/06 0421) SpO2:  [91 %-95 %] 95 % (08/06 0421) Weight:  [188.1 kg (414 lb 9.6 oz)] 188.1 kg (414 lb 9.6 oz) (08/06 0421)    Weight change: Filed Weights   06/01/16 2046 06/02/16 0500 06/03/16 0421  Weight: (!) 185.5 kg (409 lb) (!) 188 kg (414 lb 7.4 oz) (!) 188.1 kg (414 lb 9.6 oz)    Intake/Output:   Intake/Output Summary (Last 24 hours) at 06/03/16 0823 Last data filed at 06/03/16 0739  Gross per 24 hour  Intake              840 ml  Output             2625 ml  Net            -1785 ml     Physical Exam: General:  Obese male sitting in bed NAD HEENT: normal Neck: supple. JVP difficult to assess due to neck girth . Carotids 2+ bilat; no bruits. No lymphadenopathy or thyromegaly appreciated. Cor: PMI nondisplaced. Regular rate & rhythm. No rubs, gallops or murmurs. Lungs: clear Abdomen: Morbidly obese, soft, NT, ND, Unable to assess for  hepatosplenomegaly. No bruits or masses. Good bowel sounds. Extremities: no cyanosis, clubbing, rash. 2+ edema Neuro: alert & orientedx3, cranial nerves grossly intact. moves all 4 extremities w/o difficulty. Affect pleasant   Labs: Basic Metabolic Panel:  Recent Labs Lab 06/01/16 2054 06/02/16 1059 06/03/16 0406   NA 137 139 138  K 4.5 4.6 4.8  CL 89* 91* 87*  CO2 41* 41* 43*  GLUCOSE 346* 417* 437*  BUN 31* 30* 33*  CREATININE 1.40* 1.29* 1.36*  CALCIUM 9.1 8.9 9.0    Liver Function Tests:  Recent Labs Lab 06/01/16 2054  AST 27  ALT 19  ALKPHOS 102  BILITOT 0.7  PROT 7.1  ALBUMIN 3.1*   No results for input(s): LIPASE, AMYLASE in the last 168 hours. No results for input(s): AMMONIA in the last 168 hours.  CBC:  Recent Labs Lab 06/01/16 2054 06/02/16 1059  WBC 13.7* 11.9*  NEUTROABS 10.0*  --   HGB 11.5* 10.9*  HCT 40.0 37.4*  MCV 87.7 87.6  PLT 270 252    Cardiac Enzymes:  Recent Labs Lab 06/02/16 0019 06/02/16 1059 06/02/16 1415  TROPONINI 0.03* 0.06* 0.04*    BNP: BNP (last 3 results)  Recent Labs  02/08/16 1513 04/03/16 1252 06/01/16 2054  BNP 29.9 23.9 56.7    ProBNP (last 3 results) No results for input(s): PROBNP in the last 8760 hours.   CBG:  Recent Labs Lab 06/02/16 0832 06/02/16 1128 06/02/16 1621 06/02/16 2154 06/03/16 0738  GLUCAP 392* 403* 319* 375* 387*    Coagulation Studies: No results for input(s): LABPROT, INR  in the last 72 hours.  Other results:   Imaging: Dg Chest Portable 1 View  Result Date: 06/01/2016 CLINICAL DATA:  Patient with shortness of breath. EXAM: PORTABLE CHEST 1 VIEW COMPARISON:  Chest radiograph 02/23/2016 FINDINGS: Low lung volumes. Marked cardiomegaly. Monitoring leads overlie the patient. Status post median sternotomy. Pulmonary vascular redistribution and new bilateral interstitial pulmonary opacities. Probable small bilateral pleural effusions. IMPRESSION: Cardiomegaly and interstitial pulmonary edema with small bilateral pleural effusions. Markedly low lung volumes. Electronically Signed   By: Lovey Newcomer M.D.   On: 06/01/2016 21:25     Medications:     Current Medications: . antiseptic oral rinse  7 mL Mouth Rinse BID  . aspirin EC  81 mg Oral Daily  . atorvastatin  40 mg Oral q1800  .  colchicine  0.6 mg Oral Daily  . enoxaparin (LOVENOX) injection  90 mg Subcutaneous Daily  . febuxostat  40 mg Oral Daily  . fenofibrate  54 mg Oral Daily  . furosemide  80 mg Intravenous Q12H  . insulin aspart  0-20 Units Subcutaneous TID WC  . insulin aspart  0-5 Units Subcutaneous QHS  . insulin aspart  15 Units Subcutaneous TID WC  . insulin glargine  60 Units Subcutaneous BID  . metolazone  2.5 mg Oral BID  . potassium chloride SA  40 mEq Oral Daily  . predniSONE  40 mg Oral Q breakfast  . sodium chloride flush  3 mL Intravenous Q12H    Infusions:     Assessment   1. Acute on Chronic Diastolic HF 2. AKI on CKD III  3. Bioprosthetic aortic valve 4. Morbid Obesity 5. OSA 6. DM2, poorly controlled  7. HTN 8. Acute Gout 9. Chronic respiratory failure on home O2 10. RBBB  Plan    He remains volume overloaded. Diuresing only modestly with IV lasix. Fluid retention likely worsened by steroids. Will continue lasix 80 IV bid. Add metolazone 5 bid  Primary team addressing DM2 and gout.   Length of Stay: 2  Glori Bickers MD 06/03/2016, 8:23 AM  Advanced Heart Failure Team Pager [phone] (M-F; 7a - 4p)  Please contact [facility] for night-coverage after hours (4p -7a ) and weekends on [email or web address]

## 2016-06-03 NOTE — Evaluation (Signed)
Physical Therapy Evaluation Patient Details Name: Steele Zimmerly MRN: [id number] DOB: February 01, 1951 Today's Date: 06/03/2016   History of Present Illness    65 y.o.gentleman with a history of diastolic heart failure, HTN, HLD, DM, bioprosthetic aortic valve replacement, chronic respiratory failure with O2 dependence at baseline (4L [place]), and gout who presents to the ED complaining of progressive shortness of breath and DOE and increased left knee pain and swelling, which he attributes to his known history of gout. Admit for CHF exacerbation.  Clinical Impression  Pt presents with significant limitations to functional mobility due to pain, weakness, shortness of breath, cardiopulmonary dysfunction, obesity, requires assistance for safety to perform simple transfers using RW.  Pt lives alone and has 4 steps to enter home.  Noted O2 levels unstable during mobility, dropping to mid 80's, pt subjectively asymptomatic, with some observed word-finding difficulty occasionally.  Eventually O2 levels stabilized after pt had BM and is comfortably positioned.    Pt willing to go to postacute rehab at SNF, recommend CSW to assist with placement.  Recommend nursing staff help with sit EOB and bed<>beside commode transfers.  PT will initiate rehab interventions during acute stay with goal to transfer to SNF at d/c.  See below for detailed exam findings and care plan for further goals of care.       Follow Up Recommendations SNF    Equipment Recommendations  None recommended by PT    Recommendations for Other Services       Precautions / Restrictions        Mobility  Bed Mobility Overal bed mobility: Needs Assistance Bed Mobility: Supine to Sit;Sit to Supine     Supine to sit: Mod assist;HOB elevated Sit to supine: Mod assist;HOB elevated   General bed mobility comments: needs incr time to change positions, heavily dependent on rails and reaches/pulls on therapist to sit up.  Uses recliner chair  for at home  Transfers Overall transfer level: Needs assistance Equipment used: Rolling walker (2 wheeled) Transfers: Sit to/from [employer] Sit to Stand: Min assist;From elevated surface Stand pivot transfers: Min assist       General transfer comment: pt needs incr time and effort with heavy use of UEs to assist to achieve standing, verbal cues to transfer hands to/from RW safely and efficiently but able to perform with little to no physical assistance  Ambulation/Gait Ambulation/Gait assistance:  (unable to attempt this session)              Stairs            Wheelchair Mobility    Modified Rankin (Stroke Patients Only)       Balance Overall balance assessment: Needs assistance Sitting-balance support: Feet supported;No upper extremity supported Sitting balance-Leahy Scale: Good     Standing balance support: During functional activity;Bilateral upper extremity supported Standing balance-Leahy Scale: Poor Standing balance comment: depends on RW for safety, could likely stand unassisted for short time but unable to attempt this session due to concerns about gout in knee and instabilty of monitor/vitals. see gen comments Single Leg Stance - Right Leg: 0 Single Leg Stance - Left Leg: 0                         Pertinent Vitals/Pain Pain Assessment: No/denies pain    Home Living Family/patient expects to be discharged to:: Private residence Living Arrangements: Non-relatives/Friends Available Help at Discharge: Family;Available PRN/intermittently Type of Home: House Home Access: Stairs  to enter Entrance Stairs-Rails: Right Entrance Stairs-Number of Steps: 4 Home Layout: One level Home Equipment: Walker - 2 wheels;Walker - 4 wheels;Shower seat;Bedside commode;Grab bars - toilet;Grab bars - tub/shower;Adaptive equipment Additional Comments: has all the equipment from recent SNF admit    Prior Function Level of Independence: Needs  assistance   Gait / Transfers Assistance Needed: using RW, fall x2 needing EMS assist to get up  ADL's / Homemaking Assistance Needed: assist with bathing  Comments: not driving, help to descend 4 steps for MD appts     Hand Dominance   Dominant Hand: Right    Extremity/Trunk Assessment   Upper Extremity Assessment: Defer to OT evaluation           Lower Extremity Assessment: Generalized weakness (edematous, black sores on toes)         Communication   Communication: No difficulties  Cognition Arousal/Alertness: Awake/alert Behavior During Therapy: WFL for tasks assessed/performed (intially agitated due to needs bathroom) Overall Cognitive Status: Within Functional Limits for tasks assessed       Memory:  (occasional word finding problems)              General Comments General comments (skin integrity, edema, etc.): dark sores on toes, dry scaly skin on lower legs, edematous+obesity    Exercises        Assessment/Plan    PT Assessment Patient needs continued PT services  PT Diagnosis Difficulty walking   PT Problem List Decreased strength;Decreased range of motion;Decreased activity tolerance;Decreased balance;Decreased mobility;Decreased knowledge of use of DME;Cardiopulmonary status limiting activity;Obesity;Decreased skin integrity  PT Treatment Interventions Patient/family education;Therapeutic exercise;Therapeutic activities;Functional mobility training;Gait training;DME instruction   PT Goals (Current goals can be found in the Care Plan section) Acute Rehab PT Goals Patient Stated Goal: dance PT Goal Formulation: With patient Time For Goal Achievement: 06/17/16 Potential to Achieve Goals: Good    Frequency Min 2X/week   Barriers to discharge Inaccessible home environment;Decreased caregiver support 4 steps to enter, no consistent caregiver    Co-evaluation               End of Session Equipment Utilized During Treatment: Gait  belt;Oxygen Activity Tolerance: Patient tolerated treatment well Patient left: in bed;with call bell/phone within reach;with nursing/sitter in room Nurse Communication: Mobility status;Precautions (left bariatric RW in room.  Belongs to CIR)         Time: 1191-4782 PT Time Calculation (min) (ACUTE ONLY): 65 min   Charges:   PT Evaluation $PT Eval Moderate Complexity: 1 Procedure $PT Eval High Complexity: 1 Procedure PT Treatments $Therapeutic Activity: 38-52 mins   PT G Codes:        Dennis Bast 06/03/2016, 11:26 AM

## 2016-06-03 NOTE — Progress Notes (Signed)
[facility]                                                                              Patient Demographics  Antonio [name], is a 65 y.o. male, DOB - 1951/06/29, [id number]  Admit date - 06/01/2016   Admitting Physician Lily Kocher, MD  Outpatient Primary MD for the patient is [name], MD  Outpatient specialists:   LOS - 2  days    Chief Complaint  Patient presents with  . Shortness of Breath       Brief summary   Antonio [name] is a 65 y.o. gentleman with a history of diastolic heart failure, HTN, HLD, DM, bioprosthetic aortic valve replacement, chronic respiratory failure with O2 dependence at baseline (4L [place]), and gout who presents to the ED complaining of progressive shortness of breath and DOE.  He reported 16 lb weight gain in the past two weeks.  He complains of increased abdominal girth and increased lower extremity edema.  No chest pain. He is also complaining of increased left knee pain and swelling, which he attributes to his known history of gout.  Despite his heart failure, he reports that he has been treated with prednisone for his acute flairs "for 15 years".  He just received a prescription for prednisone today, prior to presenting to the ED for his shortness of breath.  He is adamant that he receive this medication.  Surprisingly, he has never had a local corticosteroid injection to the affected knee. ED Course: The patient required BiPAP upon arrival for acute hypoxia.  Chest xray showed cardiomegaly with interstitial pulmonary edema and small bilateral effusion.  BNP was normal.  First troponin 0.  The patient received lasix '80mg'$  IV one time with good response.  He has improved with BiPAP and has taken his mask off and is asking to eat and drink.  Hospitalist asked to admit for CHF exacerbation.   Assessment & Plan    Acute exacerbation of chronic diastolic heart failure - Bipap off - Currently on aggressive IV diuresis 80 mg  every 12 hours, negative balance of 3.2 L. Weight 414 lbs (per patient his dry weight is 360-370's lbs) - Outpatient he is on torsemide 80 mg twice a day - cardiology following    Acute gout flare in left knee-improving - Patient refused right knee x-ray, continue prednisone x 3days, colchicine and uloric --elevated ESR, CRP, uric acid  DM uncontrolled, with hyperglycemia likely secondary to steroids -- increase lantus, increase meal coverage -- Aggressive sliding scale due to obesity and steroid use  Morbid obesity - Patient counseled on diet and weight control  Hypertension -BP currently stable   Chronic kidney disease stage 3 - Baseline creatinine 1.1-2, Cr 1.3  - Follow closely with diuresis  OSA on CPAP  Code Status:Full code DVT Prophylaxis:  Lovenox Family Communication: Discussed in detail with the patient, all imaging results, lab results explained to the patient  Disposition Plan:   Time Spent in minutes   25 minutes  Procedures:  CXR  Consultants:   cardiology  Antimicrobials:      Medications  Scheduled  Meds: . antiseptic oral rinse  7 mL Mouth Rinse BID  . aspirin EC  81 mg Oral Daily  . atorvastatin  40 mg Oral q1800  . colchicine  0.6 mg Oral Daily  . enoxaparin (LOVENOX) injection  90 mg Subcutaneous Daily  . febuxostat  40 mg Oral Daily  . fenofibrate  54 mg Oral Daily  . furosemide  80 mg Intravenous Q12H  . insulin aspart  0-20 Units Subcutaneous TID WC  . insulin aspart  0-5 Units Subcutaneous QHS  . insulin aspart  15 Units Subcutaneous TID WC  . insulin glargine  60 Units Subcutaneous BID  . metolazone  5 mg Oral BID  . potassium chloride SA  20 mEq Oral Daily  . predniSONE  40 mg Oral Q breakfast  . sodium chloride flush  3 mL Intravenous Q12H   Continuous Infusions:  PRN Meds:.acetaminophen **OR** acetaminophen, HYDROcodone-acetaminophen, ondansetron **OR** ondansetron (ZOFRAN) IV   Antibiotics   Anti-infectives    None          Subjective:   Antonio [name] was seen and examined today. Left knee better, shortness of breath is improving. No fevers or chills. Patient denies dizziness, chest pain, abdominal pain, N/V/D/C, new weakness, numbess, tingling. No acute events overnight.    Objective:   Vitals:   06/02/16 1616 06/02/16 2000 06/03/16 0002 06/03/16 0421  BP: (!) 142/80 134/81 138/75 (!) 145/90  Pulse: 99 (!) 104 (!) 103 98  Resp: (!) 28 (!) 27 (!) 23 (!) 26  Temp: 97.8 F (36.6 C) 98 F (36.7 C) 97.6 F (36.4 C) 97.8 F (36.6 C)  TempSrc: Oral     SpO2: 91% 93% 94% 95%  Weight:    (!) 188.1 kg (414 lb 9.6 oz)  Height:        Intake/Output Summary (Last 24 hours) at 06/03/16 1048 Last data filed at 06/03/16 0800  Gross per 24 hour  Intake             1080 ml  Output             2125 ml  Net            -1045 ml     Wt Readings from Last 3 Encounters:  06/03/16 (!) 188.1 kg (414 lb 9.6 oz)  05/03/16 (!) 176 kg (388 lb)  04/03/16 (!) 174.4 kg (384 lb 8 oz)     Exam  General: Alert and oriented x 3, NAD  HEENT:    Neck: Supple, + JVD  Cardiovascular: S1 S2 clear, RRR  Respiratory: Decreased breath sounds at the bases   Gastrointestinal: Morbidly obese Soft, NT, NBS, ND, + bowel sounds  Ext: no cyanosis clubbing, 2+ edema with right LE fluid blisters   Neuro: no new neuro deficits   Skin: No rashes  Psych: Normal affect and demeanor, alert and oriented x3    Data Reviewed:  I have personally reviewed following labs and imaging studies  Micro Results Recent Results (from the past 240 hour(s))  MRSA PCR Screening     Status: None   Collection Time: 06/02/16 12:02 AM  Result Value Ref Range Status   MRSA by PCR NEGATIVE NEGATIVE Final    Comment:        The GeneXpert MRSA Assay (FDA approved for NASAL specimens only), is one component of a comprehensive MRSA colonization surveillance program. It is not intended to diagnose MRSA infection nor to guide  or monitor treatment for MRSA infections.  Radiology Reports Dg Chest Portable 1 View  Result Date: 06/01/2016 CLINICAL DATA:  Patient with shortness of breath. EXAM: PORTABLE CHEST 1 VIEW COMPARISON:  Chest radiograph 02/23/2016 FINDINGS: Low lung volumes. Marked cardiomegaly. Monitoring leads overlie the patient. Status post median sternotomy. Pulmonary vascular redistribution and new bilateral interstitial pulmonary opacities. Probable small bilateral pleural effusions. IMPRESSION: Cardiomegaly and interstitial pulmonary edema with small bilateral pleural effusions. Markedly low lung volumes. Electronically Signed   By: Lovey Newcomer M.D.   On: 06/01/2016 21:25    Lab Data:  CBC:  Recent Labs Lab 06/01/16 2054 06/02/16 1059  WBC 13.7* 11.9*  NEUTROABS 10.0*  --   HGB 11.5* 10.9*  HCT 40.0 37.4*  MCV 87.7 87.6  PLT 270 212   Basic Metabolic Panel:  Recent Labs Lab 06/01/16 2054 06/02/16 1059 06/03/16 0406  NA 137 139 138  K 4.5 4.6 4.8  CL 89* 91* 87*  CO2 41* 41* 43*  GLUCOSE 346* 417* 437*  BUN 31* 30* 33*  CREATININE 1.40* 1.29* 1.36*  CALCIUM 9.1 8.9 9.0   GFR: Estimated Creatinine Clearance: 92.2 mL/min (by C-G formula based on SCr of 1.36 mg/dL). Liver Function Tests:  Recent Labs Lab 06/01/16 2054  AST 27  ALT 19  ALKPHOS 102  BILITOT 0.7  PROT 7.1  ALBUMIN 3.1*   No results for input(s): LIPASE, AMYLASE in the last 168 hours. No results for input(s): AMMONIA in the last 168 hours. Coagulation Profile: No results for input(s): INR, PROTIME in the last 168 hours. Cardiac Enzymes:  Recent Labs Lab 06/02/16 0019 06/02/16 1059 06/02/16 1415  TROPONINI 0.03* 0.06* 0.04*   BNP (last 3 results) No results for input(s): PROBNP in the last 8760 hours. HbA1C: No results for input(s): HGBA1C in the last 72 hours. CBG:  Recent Labs Lab 06/02/16 0832 06/02/16 1128 06/02/16 1621 06/02/16 2154 06/03/16 0738  GLUCAP 392* 403* 319* 375* 387*    Lipid Profile: No results for input(s): CHOL, HDL, LDLCALC, TRIG, CHOLHDL, LDLDIRECT in the last 72 hours. Thyroid Function Tests: No results for input(s): TSH, T4TOTAL, FREET4, T3FREE, THYROIDAB in the last 72 hours. Anemia Panel: No results for input(s): VITAMINB12, FOLATE, FERRITIN, TIBC, IRON, RETICCTPCT in the last 72 hours. Urine analysis:    Component Value Date/Time   COLORURINE YELLOW 02/23/2016 0525   APPEARANCEUR TURBID (A) 02/23/2016 0525   LABSPEC 1.013 02/23/2016 0525   PHURINE 5.5 02/23/2016 0525   GLUCOSEU NEGATIVE 02/23/2016 0525   HGBUR SMALL (A) 02/23/2016 0525   HGBUR negative 06/12/2007 0843   BILIRUBINUR NEGATIVE 02/23/2016 0525   KETONESUR NEGATIVE 02/23/2016 0525   PROTEINUR 30 (A) 02/23/2016 0525   UROBILINOGEN 0.2 06/15/2015 0029   NITRITE NEGATIVE 02/23/2016 0525   LEUKOCYTESUR TRACE (A) 02/23/2016 2482     Antonio [name] M.D. [facility] 06/03/2016, 10:48 AM  Pager: [phone] Between 7am to 7pm - call Pager - [phone]  After 7pm go to [email or web address] - password [email or web address]  Call night coverage person covering after 7pm

## 2016-06-04 LAB — BASIC METABOLIC PANEL
Anion gap: 7 (ref 5–15)
BUN: 37 mg/dL — ABNORMAL HIGH (ref 6–20)
CALCIUM: 9.1 mg/dL (ref 8.9–10.3)
CHLORIDE: 85 mmol/L — AB (ref 101–111)
CO2: 49 mmol/L — AB (ref 22–32)
CREATININE: 1.21 mg/dL (ref 0.61–1.24)
GFR calc Af Amer: >60 mL/min (ref >60)
GFR calc non Af Amer: >60 mL/min (ref >60)
GLUCOSE: 230 mg/dL — AB (ref 65–99)
Potassium: 4.4 mmol/L (ref 3.5–5.1)
Sodium: 141 mmol/L (ref 135–145)

## 2016-06-04 LAB — GLUCOSE, CAPILLARY
GLUCOSE-CAPILLARY: 386 mg/dL — AB (ref 65–99)
Glucose-Capillary: 175 mg/dL — ABNORMAL HIGH (ref 65–99)
Glucose-Capillary: 165 mg/dL — ABNORMAL HIGH (ref 65–99)
Glucose-Capillary: 353 mg/dL — ABNORMAL HIGH (ref 65–99)

## 2016-06-04 MED ORDER — AMMONIUM LACTATE 12 % EX LOTN
TOPICAL_LOTION | Freq: Two times a day (BID) | CUTANEOUS | Status: AC
  Administered 2016-06-04 – 2016-06-11 (×14): via TOPICAL
  Filled 2016-06-04: qty 400

## 2016-06-04 NOTE — Progress Notes (Signed)
[facility]                                                                              Patient Demographics  Antonio [name], is a 65 y.o. male, DOB - 1951/07/08, [id number]  Admit date - 06/01/2016   Admitting Physician Lily Kocher, MD  Outpatient Primary MD for the patient is [name], MD  Outpatient specialists:   LOS - 3  days    Chief Complaint  Patient presents with  . Shortness of Breath       Brief summary   Antonio [name] is a 65 y.o. gentleman with a history of diastolic heart failure, HTN, HLD, DM, bioprosthetic aortic valve replacement, chronic respiratory failure with O2 dependence at baseline (4L [place]), and gout who presents to the ED complaining of progressive shortness of breath and DOE.  He reported 16 lb weight gain in the past two weeks.  He complains of increased abdominal girth and increased lower extremity edema.  No chest pain. He is also complaining of increased left knee pain and swelling, which he attributes to his known history of gout.  Despite his heart failure, he reports that he has been treated with prednisone for his acute flairs "for 15 years".  He just received a prescription for prednisone today, prior to presenting to the ED for his shortness of breath.  He is adamant that he receive this medication.  Surprisingly, he has never had a local corticosteroid injection to the affected knee. ED Course: The patient required BiPAP upon arrival for acute hypoxia.  Chest xray showed cardiomegaly with interstitial pulmonary edema and small bilateral effusion.  BNP was normal.  First troponin 0.  The patient received lasix 18m IV one time with good response.  He has improved with BiPAP and has taken his mask off and is asking to eat and drink.  Hospitalist asked to admit for CHF exacerbation.   Assessment & Plan    Acute exacerbation of chronic diastolic heart failure - Bipap off - Currently on aggressive IV diuresis 80 mg  every 12 hours, negative balance of 6.1 L. Weight 414 lbs -> 411(per patient his dry weight is 360-370's lbs) - Outpatient he is on torsemide 80 mg twice a day - cardiology following  - Wound care consult for the right lower extremity   Acute gout flare in left knee-improving - Patient refused right knee x-ray, continue prednisone x 3days, colchicine and uloric --elevated ESR, CRP, uric acid - Per patient, he was able to walk on his knee yesterday  DM uncontrolled, with hyperglycemia likely secondary to steroids -- Continue current insulin regimen, prednisone OFF now, expecting better glycemic control  -- Aggressive sliding scale due to obesity and steroid use  Morbid obesity - Patient counseled on diet and weight control  Hypertension -BP currently stable   Chronic kidney disease stage 3 - Baseline creatinine 1.1-2, Cr 1.3  - Follow closely with diuresis  OSA on CPAP  Code Status:Full code DVT Prophylaxis:  Lovenox Family Communication: Discussed in detail with the patient, all imaging results, lab results explained to the patient  Disposition Plan:  Time Spent in minutes   25 minutes  Procedures:  CXR  Consultants:   cardiology  Antimicrobials:      Medications  Scheduled Meds: . antiseptic oral rinse  7 mL Mouth Rinse BID  . aspirin EC  81 mg Oral Daily  . atorvastatin  40 mg Oral q1800  . colchicine  0.6 mg Oral Daily  . enoxaparin (LOVENOX) injection  90 mg Subcutaneous Daily  . febuxostat  40 mg Oral Daily  . fenofibrate  54 mg Oral Daily  . furosemide  80 mg Intravenous Q12H  . insulin aspart  0-20 Units Subcutaneous TID WC  . insulin aspart  0-5 Units Subcutaneous QHS  . insulin aspart  15 Units Subcutaneous TID WC  . insulin glargine  60 Units Subcutaneous BID  . metolazone  5 mg Oral BID  . potassium chloride SA  20 mEq Oral Daily  . sodium chloride flush  3 mL Intravenous Q12H   Continuous Infusions:  PRN Meds:.acetaminophen **OR**  acetaminophen, HYDROcodone-acetaminophen, ondansetron **OR** ondansetron (ZOFRAN) IV   Antibiotics   Anti-infectives    None        Subjective:   Antonio [name] was seen and examined today. Left knee better, and per patient able to walk on it yesterday. No chest pain. No acute shortness of breath. No fevers or chills. Patient denies dizziness,abdominal pain, N/V/D/C, new weakness, numbess, tingling. No acute events overnight.    Objective:   Vitals:   06/03/16 1625 06/03/16 2100 06/03/16 2255 06/04/16 0500  BP: (!) 146/70 137/82 (!) 147/90 (!) 159/87  Pulse:  98 (!) 101 (!) 101  Resp:   (!) 22 (!) 21  Temp:  97.7 F (36.5 C) 97.8 F (36.6 C) 98.1 F (36.7 C)  TempSrc:      SpO2:  93% 96% 100%  Weight:    (!) 186.6 kg (411 lb 4.8 oz)  Height:        Intake/Output Summary (Last 24 hours) at 06/04/16 0959 Last data filed at 06/04/16 0700  Gross per 24 hour  Intake             1320 ml  Output             4300 ml  Net            -2980 ml     Wt Readings from Last 3 Encounters:  06/04/16 (!) 186.6 kg (411 lb 4.8 oz)  05/03/16 (!) 176 kg (388 lb)  04/03/16 (!) 174.4 kg (384 lb 8 oz)     Exam  General: Alert and oriented x 3, NAD  HEENT:    Neck: Supple + JVD  Cardiovascular: S1 S2 clear, RRR  Respiratory: Decreased breath sounds at the bases   Gastrointestinal: Morbidly obese Soft, NT, NBS, ND, + bowel sounds  Ext: no cyanosis clubbing, 2+ edema with right LE fluid blisters, able to flex left knee   Neuro: no new neuro deficits   Skin: No rashes  Psych: Normal affect and demeanor, alert and oriented x3    Data Reviewed:  I have personally reviewed following labs and imaging studies  Micro Results Recent Results (from the past 240 hour(s))  MRSA PCR Screening     Status: None   Collection Time: 06/02/16 12:02 AM  Result Value Ref Range Status   MRSA by PCR NEGATIVE NEGATIVE Final    Comment:        The GeneXpert MRSA Assay (FDA approved for  NASAL specimens only),  is one component of a comprehensive MRSA colonization surveillance program. It is not intended to diagnose MRSA infection nor to guide or monitor treatment for MRSA infections.     Radiology Reports Dg Chest Portable 1 View  Result Date: 06/01/2016 CLINICAL DATA:  Patient with shortness of breath. EXAM: PORTABLE CHEST 1 VIEW COMPARISON:  Chest radiograph 02/23/2016 FINDINGS: Low lung volumes. Marked cardiomegaly. Monitoring leads overlie the patient. Status post median sternotomy. Pulmonary vascular redistribution and new bilateral interstitial pulmonary opacities. Probable small bilateral pleural effusions. IMPRESSION: Cardiomegaly and interstitial pulmonary edema with small bilateral pleural effusions. Markedly low lung volumes. Electronically Signed   By: Lovey Newcomer M.D.   On: 06/01/2016 21:25    Lab Data:  CBC:  Recent Labs Lab 06/01/16 2054 06/02/16 1059  WBC 13.7* 11.9*  NEUTROABS 10.0*  --   HGB 11.5* 10.9*  HCT 40.0 37.4*  MCV 87.7 87.6  PLT 270 622   Basic Metabolic Panel:  Recent Labs Lab 06/01/16 2054 06/02/16 1059 06/03/16 0406 06/04/16 0516  NA 137 139 138 141  K 4.5 4.6 4.8 4.4  CL 89* 91* 87* 85*  CO2 41* 41* 43* 49*  GLUCOSE 346* 417* 437* 230*  BUN 31* 30* 33* 37*  CREATININE 1.40* 1.29* 1.36* 1.21  CALCIUM 9.1 8.9 9.0 9.1   GFR: Estimated Creatinine Clearance: 103.1 mL/min (by C-G formula based on SCr of 1.21 mg/dL). Liver Function Tests:  Recent Labs Lab 06/01/16 2054  AST 27  ALT 19  ALKPHOS 102  BILITOT 0.7  PROT 7.1  ALBUMIN 3.1*   No results for input(s): LIPASE, AMYLASE in the last 168 hours. No results for input(s): AMMONIA in the last 168 hours. Coagulation Profile: No results for input(s): INR, PROTIME in the last 168 hours. Cardiac Enzymes:  Recent Labs Lab 06/02/16 0019 06/02/16 1059 06/02/16 1415  TROPONINI 0.03* 0.06* 0.04*   BNP (last 3 results) No results for input(s): PROBNP in the  last 8760 hours. HbA1C: No results for input(s): HGBA1C in the last 72 hours. CBG:  Recent Labs Lab 06/03/16 0738 06/03/16 1146 06/03/16 1644 06/03/16 2158 06/04/16 0732  GLUCAP 387* 154* 266* 220* 175*   Lipid Profile: No results for input(s): CHOL, HDL, LDLCALC, TRIG, CHOLHDL, LDLDIRECT in the last 72 hours. Thyroid Function Tests: No results for input(s): TSH, T4TOTAL, FREET4, T3FREE, THYROIDAB in the last 72 hours. Anemia Panel: No results for input(s): VITAMINB12, FOLATE, FERRITIN, TIBC, IRON, RETICCTPCT in the last 72 hours. Urine analysis:    Component Value Date/Time   COLORURINE YELLOW 02/23/2016 0525   APPEARANCEUR TURBID (A) 02/23/2016 0525   LABSPEC 1.013 02/23/2016 0525   PHURINE 5.5 02/23/2016 0525   GLUCOSEU NEGATIVE 02/23/2016 0525   HGBUR SMALL (A) 02/23/2016 0525   HGBUR negative 06/12/2007 0843   BILIRUBINUR NEGATIVE 02/23/2016 0525   KETONESUR NEGATIVE 02/23/2016 0525   PROTEINUR 30 (A) 02/23/2016 0525   UROBILINOGEN 0.2 06/15/2015 0029   NITRITE NEGATIVE 02/23/2016 0525   LEUKOCYTESUR TRACE (A) 02/23/2016 6333     RAI,RIPUDEEP M.D. [facility] 06/04/2016, 9:59 AM  Pager: [phone] Between 7am to 7pm - call Pager - [phone]  After 7pm go to [email or web address] - password [email or web address]  Call night coverage person covering after 7pm

## 2016-06-04 NOTE — NC FL2 (Signed)
[place] MEDICAID FL2 LEVEL OF CARE SCREENING TOOL     IDENTIFICATION  Patient Name: Antonio [name] Birthdate: 1950/11/19 Sex: male Admission Date (Current Location): 06/01/2016  [facility] [place] and [place] Number:  Producer, television/film/video and Address:  [facility], [address] [place], [place] [address]      Provider Number: [id number]  Attending Physician Name and Address:  Cathren Harsh, MD  Relative Name and Phone Number:       Current Level of Care: Hospital Recommended Level of Care: Skilled Nursing Facility Prior Approval Number:    Date Approved/Denied:   PASRR Number: [id number]  Discharge Plan: SNF    Current Diagnoses: Patient Active Problem List   Diagnosis Date Noted  . Knee pain, acute   . CHF (congestive heart failure) (HCC) 06/01/2016  . Acute on chronic congestive heart failure (HCC) 06/01/2016  . AKI (acute kidney injury) (HCC)   . Hypotension 02/23/2016  . Nausea and vomiting 02/23/2016  . Acute on chronic diastolic HF (heart failure) (HCC)   . Acute renal failure superimposed on stage 3 chronic kidney disease (HCC) 02/08/2016  . Diarrhea 02/08/2016  . Dehydration 02/08/2016  . Acute-on-chronic kidney injury (HCC) 02/08/2016  . CKD (chronic kidney disease), stage III 01/20/2016  . Obesity hypoventilation syndrome (HCC)   . Insulin dependent diabetes mellitus (HCC)   . HCAP (healthcare-associated pneumonia)   . OSA on CPAP   . Morbid obesity (HCC)   . PNA (pneumonia) 01/03/2016  . Respiratory failure (HCC) 01/01/2016  . Shortness of breath   . Wound of right leg 11/03/2015  . Chronic diastolic CHF (congestive heart failure) (HCC) 09/08/2015  . S/P aortic valve replacement with bioprosthetic valve 02/22/2015  . PAF (paroxysmal atrial fibrillation) (HCC) 12/08/2011  . Sleep apnea 04/30/2011  . ABNORMAL CV (STRESS) TEST 11/15/2010  . Aortic stenosis 09/26/2010  . Hyperlipidemia 12/26/2006  . Gout 12/26/2006  . OBESITY,  NOS 12/26/2006  . HYPERTENSION, BENIGN SYSTEMIC 12/26/2006  . EDEMA-LEGS,DUE TO VENOUS OBSTRUCT. 12/26/2006  . OSTEOARTHRITIS OF SPINE, NOS 12/26/2006    Orientation RESPIRATION BLADDER Height & Weight     Self, Time, Situation, Place  O2 (6L, attempting to wean, 4L chronic at home.) Continent Weight: (!) 411 lb 4.8 oz (186.6 kg) Height:  5\' 11"  (180.3 cm)  BEHAVIORAL SYMPTOMS/MOOD NEUROLOGICAL BOWEL NUTRITION STATUS      Continent Diet (Carb Modified, thin liquids, 1200 mL fluid restriction)  AMBULATORY STATUS COMMUNICATION OF NEEDS Skin     Verbally Normal                       Personal Care Assistance Level of Assistance  Bathing, Dressing, Feeding Bathing Assistance: Limited assistance Feeding assistance: Independent Dressing Assistance: Limited assistance     Functional Limitations Info  Sight, Hearing, Speech Sight Info: Adequate Hearing Info: Adequate Speech Info: Adequate    SPECIAL CARE FACTORS FREQUENCY  PT (By licensed PT), OT (By licensed OT)     PT Frequency: 5 OT Frequency: 5            Contractures      Additional Factors Info  Code Status, Allergies, Insulin Sliding Scale Code Status Info: Full Code Allergies Info: Other   Insulin Sliding Scale Info: 4x/day       Current Medications (06/04/2016):  This is the current hospital active medication list Current Facility-Administered Medications  Medication Dose Route Frequency Provider Last Rate Last Dose  . acetaminophen (TYLENOL)  tablet 650 mg  650 mg Oral Q6H PRN Michael Litter, MD   650 mg at 06/02/16 1136   Or  . acetaminophen (TYLENOL) suppository 650 mg  650 mg Rectal Q6H PRN Michael Litter, MD      . ammonium lactate (LAC-HYDRIN) 12 % lotion   Topical BID Ripudeep K Rai, MD      . antiseptic oral rinse (CPC / CETYLPYRIDINIUM CHLORIDE 0.05%) solution 7 mL  7 mL Mouth Rinse BID Michael Litter, MD   7 mL at 06/04/16 1004  . aspirin EC tablet 81 mg  81 mg Oral Daily Michael Litter, MD   81 mg at  06/04/16 0400  . atorvastatin (LIPITOR) tablet 40 mg  40 mg Oral q1800 Michael Litter, MD   40 mg at 06/03/16 1747  . colchicine tablet 0.6 mg  0.6 mg Oral Daily Michael Litter, MD   0.6 mg at 06/04/16 1003  . enoxaparin (LOVENOX) injection 90 mg  90 mg Subcutaneous Daily Michael Litter, MD   90 mg at 06/04/16 1003  . febuxostat (ULORIC) tablet 40 mg  40 mg Oral Daily Michael Litter, MD   40 mg at 06/04/16 1004  . fenofibrate tablet 54 mg  54 mg Oral Daily Michael Litter, MD   54 mg at 06/04/16 1004  . furosemide (LASIX) injection 80 mg  80 mg Intravenous Q12H Michael Litter, MD   80 mg at 06/04/16 0626  . HYDROcodone-acetaminophen (NORCO) 7.5-325 MG per tablet 1 tablet  1 tablet Oral Q4H PRN Michael Litter, MD   1 tablet at 06/03/16 1249  . insulin aspart (novoLOG) injection 0-20 Units  0-20 Units Subcutaneous TID WC Michael Litter, MD   4 Units at 06/04/16 1226  . insulin aspart (novoLOG) injection 0-5 Units  0-5 Units Subcutaneous QHS Michael Litter, MD   2 Units at 06/03/16 2252  . insulin aspart (novoLOG) injection 15 Units  15 Units Subcutaneous TID WC Ripudeep Jenna Luo, MD   15 Units at 06/04/16 1227  . insulin glargine (LANTUS) injection 60 Units  60 Units Subcutaneous BID Ripudeep Jenna Luo, MD   60 Units at 06/04/16 0841  . metolazone (ZAROXOLYN) tablet 5 mg  5 mg Oral BID Dolores Patty, MD   5 mg at 06/04/16 0840  . ondansetron (ZOFRAN) tablet 4 mg  4 mg Oral Q6H PRN Michael Litter, MD       Or  . ondansetron [facility]) injection 4 mg  4 mg Intravenous Q6H PRN Michael Litter, MD      . potassium chloride SA (K-DUR,KLOR-CON) CR tablet 20 mEq  20 mEq Oral Daily Dolores Patty, MD   20 mEq at 06/04/16 1003  . sodium chloride flush (NS) 0.9 % injection 3 mL  3 mL Intravenous Q12H Michael Litter, MD   3 mL at 06/04/16 1004     Discharge Medications: Please see discharge summary for a list of discharge medications.  Relevant Imaging Results:  Relevant Lab Results:   Additional Information SSN:   [id number]  Dede Query, LCSW

## 2016-06-04 NOTE — Progress Notes (Signed)
Advanced Heart Failure Rounding Note  Referring Physician: Dr Blake Divine Primary Physician: Dr Leonette Most [facility]) Primary Cardiologist:  Dr. Shirlee Latch   Subjective:    Feeling better. Isn't sure what caused his volume overload but have previously discussed his problems with dietary indiscretion and fluid limitation.   Denies DOE or CP. No lightheadedness or dizziness.   Out 2.7 L and down >3 lbs. K and Creatinine stable.   Objective:   Weight Range: (!) 411 lb 4.8 oz (186.6 kg) Body mass index is 57.36 kg/m.   Vital Signs:   Temp:  [97.7 F (36.5 C)-98.8 F (37.1 C)] 98.1 F (36.7 C) (08/07 0500) Pulse Rate:  [98-105] 101 (08/07 0500) Resp:  [21-23] 21 (08/07 0500) BP: (137-159)/(68-95) 159/87 (08/07 0500) SpO2:  [93 %-100 %] 100 % (08/07 0500) Weight:  [411 lb 4.8 oz (186.6 kg)] 411 lb 4.8 oz (186.6 kg) (08/07 0500)    Weight change: Filed Weights   06/02/16 0500 06/03/16 0421 06/04/16 0500  Weight: (!) 414 lb 7.4 oz (188 kg) (!) 414 lb 9.6 oz (188.1 kg) (!) 411 lb 4.8 oz (186.6 kg)    Intake/Output:   Intake/Output Summary (Last 24 hours) at 06/04/16 0719 Last data filed at 06/04/16 0445  Gross per 24 hour  Intake             1560 ml  Output             4300 ml  Net            -2740 ml     Physical Exam: General:  No resp difficulty HEENT: normal Neck: supple. JVP difficult to assess due to body habitus . Carotids 2+ bilat; no bruits. No thyromegaly or nodule noted. Cor: PMI nondisplaced. Regular rate & rhythm. No M/G/R appreciated Lungs: CTAB, normal effort Abdomen: soft, NT, ND, no HSM. No bruits or masses. +BS  Extremities: no cyanosis, clubbing, rash, 2+ edema to knees. R leg with several areas of bullae  Neuro: alert & orientedx3, cranial nerves grossly intact. moves all 4 extremities w/o difficulty. Affect pleasant  Telemetry: Reviewed personally, NSR.   Labs: CBC  Recent Labs  06/01/16 2054 06/02/16 1059  WBC 13.7* 11.9*  NEUTROABS 10.0*   --   HGB 11.5* 10.9*  HCT 40.0 37.4*  MCV 87.7 87.6  PLT 270 252   Basic Metabolic Panel  Recent Labs  06/03/16 0406 06/04/16 0516  NA 138 141  K 4.8 4.4  CL 87* 85*  CO2 43* 49*  GLUCOSE 437* 230*  BUN 33* 37*  CREATININE 1.36* 1.21  CALCIUM 9.0 9.1   Liver Function Tests  Recent Labs  06/01/16 2054  AST 27  ALT 19  ALKPHOS 102  BILITOT 0.7  PROT 7.1  ALBUMIN 3.1*   No results for input(s): LIPASE, AMYLASE in the last 72 hours. Cardiac Enzymes  Recent Labs  06/02/16 0019 06/02/16 1059 06/02/16 1415  TROPONINI 0.03* 0.06* 0.04*    BNP: BNP (last 3 results)  Recent Labs  02/08/16 1513 04/03/16 1252 06/01/16 2054  BNP 29.9 23.9 56.7    ProBNP (last 3 results) No results for input(s): PROBNP in the last 8760 hours.   D-Dimer No results for input(s): DDIMER in the last 72 hours. Hemoglobin A1C No results for input(s): HGBA1C in the last 72 hours. Fasting Lipid Panel No results for input(s): CHOL, HDL, LDLCALC, TRIG, CHOLHDL, LDLDIRECT in the last 72 hours. Thyroid Function Tests No results for input(s): TSH, T4TOTAL,  T3FREE, THYROIDAB in the last 72 hours.  Invalid input(s): FREET3  Other results:     Imaging/Studies:   No results found.  Latest Echo  Latest Cath   Medications:     Scheduled Medications: . antiseptic oral rinse  7 mL Mouth Rinse BID  . aspirin EC  81 mg Oral Daily  . atorvastatin  40 mg Oral q1800  . colchicine  0.6 mg Oral Daily  . enoxaparin (LOVENOX) injection  90 mg Subcutaneous Daily  . febuxostat  40 mg Oral Daily  . fenofibrate  54 mg Oral Daily  . furosemide  80 mg Intravenous Q12H  . insulin aspart  0-20 Units Subcutaneous TID WC  . insulin aspart  0-5 Units Subcutaneous QHS  . insulin aspart  15 Units Subcutaneous TID WC  . insulin glargine  60 Units Subcutaneous BID  . metolazone  5 mg Oral BID  . potassium chloride SA  20 mEq Oral Daily  . predniSONE  40 mg Oral Q breakfast  . sodium  chloride flush  3 mL Intravenous Q12H     Infusions:     PRN Medications:  acetaminophen **OR** acetaminophen, HYDROcodone-acetaminophen, ondansetron **OR** ondansetron (ZOFRAN) IV   Assessment/Plan   He remains volume overloaded but diuresis beginning to pick up.  Retention likely worsening in setting of steroids.   Continue lasix 80 mg IV BID with metolazone 5 mg BID.  Can increase K supp prn. Stable for now.    Have previously used lasix gtt with metolazone with good results, can switch to this if diuresis slows. Right now getting OK output.   He remains 20-30 lbs over his baseline weight.   BP likely elevated in setting of steroids.  Have previously been soft.   Will have WOC look at his R leg wounds and blisters.   Length of Stay: 3   [name] PA-C 06/04/2016, 7:19 AM  Advanced Heart Failure Team Pager [phone] (M-F; 7a - 4p)  Please contact [facility] for night-coverage after hours (4p -7a ) and weekends on [email or web address]  Patient seen and examined with Otilio Saber, PA-C. We discussed all aspects of the encounter. I agree with the assessment and plan as stated above.   He is diuresing steadily (but not as fast as I thought he would). Continue IV lasix and metolazone. Can switch to lasix gtt as needed. Renal function stable. Supp K+. Continue treatment for gout.   Amour Trigg,MD 10:05 PM

## 2016-06-04 NOTE — Consult Note (Signed)
WOC Nurse wound consult note Reason for Consult: Aked to evaluate chronic edema, hyperkeratosis and blistering to bilateral lower extremities.  Patient states he cannot wear compression.  Legs are intact with serum filled blisters present.  Feet are dry and cracking.  Toes with flaking skin.  Wound type:Chronic venous insufficiency with heart failure.  Pressure Ulcer POA: N/a Measurement:Blisters to right anterior leg and left lateral leg. Serum filled  0.5 cm in diameter.  Wound FRT:MYTR are edematous and erythematous with chronic skin changes.  Drainage (amount, consistency, odor) NOne Periwound:Edema, erythema Dressing procedure/placement/frequency:CLeanse bilateral lower legs and feet with soap and water.  Wash vigorously enough to remove flaky skin. Apply Amlactin cream twice daily.   Will not follow at this time.  Please re-consult if needed.  Maple Hudson RN BSN CWON Pager [phone]

## 2016-06-05 LAB — BASIC METABOLIC PANEL
BUN: 36 mg/dL — AB (ref 6–20)
CALCIUM: 9.3 mg/dL (ref 8.9–10.3)
CHLORIDE: 82 mmol/L — AB (ref 101–111)
CREATININE: 1.02 mg/dL (ref 0.61–1.24)
GFR calc Af Amer: >60 mL/min (ref >60)
GFR calc non Af Amer: >60 mL/min (ref >60)
Glucose, Bld: 129 mg/dL — ABNORMAL HIGH (ref 65–99)
Potassium: 3.4 mmol/L — ABNORMAL LOW (ref 3.5–5.1)
SODIUM: 141 mmol/L (ref 135–145)

## 2016-06-05 LAB — GLUCOSE, CAPILLARY
GLUCOSE-CAPILLARY: 56 mg/dL — AB (ref 65–99)
GLUCOSE-CAPILLARY: 150 mg/dL — AB (ref 65–99)
Glucose-Capillary: 151 mg/dL — ABNORMAL HIGH (ref 65–99)
Glucose-Capillary: 196 mg/dL — ABNORMAL HIGH (ref 65–99)
Glucose-Capillary: 86 mg/dL (ref 65–99)

## 2016-06-05 LAB — MAGNESIUM: Magnesium: 2.3 mg/dL (ref 1.7–2.4)

## 2016-06-05 MED ORDER — LISINOPRIL 2.5 MG PO TABS
2.5000 mg | ORAL_TABLET | Freq: Every day | ORAL | Status: DC
  Administered 2016-06-05: 2.5 mg via ORAL
  Filled 2016-06-05: qty 1

## 2016-06-05 MED ORDER — POTASSIUM CHLORIDE CRYS ER 20 MEQ PO TBCR
40.0000 meq | EXTENDED_RELEASE_TABLET | Freq: Once | ORAL | Status: AC
  Administered 2016-06-05: 40 meq via ORAL
  Filled 2016-06-05: qty 2

## 2016-06-05 MED ORDER — OXYCODONE-ACETAMINOPHEN 5-325 MG PO TABS
1.0000 | ORAL_TABLET | ORAL | Status: DC | PRN
  Administered 2016-06-05: 1 via ORAL
  Filled 2016-06-05: qty 1

## 2016-06-05 MED ORDER — KETOROLAC TROMETHAMINE 30 MG/ML IJ SOLN
30.0000 mg | Freq: Once | INTRAMUSCULAR | Status: AC
  Administered 2016-06-05: 30 mg via INTRAVENOUS
  Filled 2016-06-05: qty 1

## 2016-06-05 NOTE — Progress Notes (Signed)
Physical Therapy Treatment Patient Details Name: Antonio [name] MRN: [id number] DOB: February 17, 1951 Today's Date: 06/05/2016    History of Present Illness 65 y.o. gentleman with a history of diastolic heart failure, HTN, HLD, DM, bioprosthetic aortic valve replacement, chronic respiratory failure with O2 dependence at baseline (4L [place]), and gout who presents to the ED complaining of progressive shortness of breath and DOE.  He reports a 16 lb weight gain in the past two weeks.     PT Comments    Patient still needs assistance with mobility. Patient has improved in bed mobility since last treatment. Patient is progressing towards goals and it still appropriate for d/c to SNF. Will continue with POC. Vitals at beginning of session: HR 97, Sp02: 92, Bp: 149/87  Follow Up Recommendations  SNF     Equipment Recommendations  None recommended by PT    Recommendations for Other Services       Precautions / Restrictions Precautions Precautions: Fall;Other (comment) (watch O2 sats) Precaution Comments: oxygen dependent at baseline  Restrictions Weight Bearing Restrictions: No    Mobility  Bed Mobility Overal bed mobility: Needs Assistance Bed Mobility: Rolling;Sidelying to Sit Rolling: Supervision Sidelying to sit: Supervision Supine to sit: HOB elevated;Supervision     General bed mobility comments: heavy reliance on handrail and patient took increased amount of time, patient needed verbal cues to reach across body when rolling , pt needed 02 sat monitored   Transfers Overall transfer level: Needs assistance Equipment used: Rolling walker (2 wheeled) Transfers: Sit to/from Stand Sit to Stand: Max assist;+2 physical assistance         General transfer comment: patient refused wearing his shoes or socks stating that he had better control with bare feet. Perfomed  +2 mod assist transfer with therapists bilateral sides of patient with patient's UEs supported, performed on +2 max  assist stand transfer from low toilet with therapists bilateral sides of patient with patient's UEs supported.  Ambulation/Gait Ambulation/Gait assistance: Min guard Ambulation Distance (Feet): 24 Feet (12 ft x 2) Assistive device: Rolling walker (2 wheeled) Gait Pattern/deviations: Wide base of support;Decreased stride length;Step-through pattern;Decreased weight shift to left;Trunk flexed Gait velocity: decreased Gait velocity interpretation: <1.8 ft/sec, indicative of risk for recurrent falls General Gait Details: Pt ambulated slowly  with RW with increased effort and fatigue. Pt ambulated with heavy reliance on RW.  Pt ambulated 12 ft to bathroom and took a long rest break at the toilet and then ambulated to chair.    Stairs            Wheelchair Mobility    Modified Rankin (Stroke Patients Only)       Balance Overall balance assessment: Needs assistance Sitting-balance support: No upper extremity supported Sitting balance-Leahy Scale: Good Sitting balance - Comments: patient able to maintain balance in sitting without UE support   Standing balance support: Bilateral upper extremity supported;During functional activity Standing balance-Leahy Scale: Poor Standing balance comment: Patient heavily relies on RW to stablize him in standing.  Patient would not be able to maintain balance without the help of the RW in standing.  In standing pt has a flexed posture. Pt stood for pericare after using the toilet and needed +1 therapist support in front of him to maintain standing.                     Cognition Arousal/Alertness: Awake/alert Behavior During Therapy: WFL for tasks assessed/performed;Impulsive Overall Cognitive Status: Within Functional Limits for tasks assessed  Exercises      General Comments General comments (skin integrity, edema, etc.): dark sores on toes, dry scaly skin on lower legs, edematous + obesity. Pt insisted on  using the bathroom and that he could be disconnected from 02. Patient's 02 sat reduced to Sp02 76% after walking to the bathroom. Administered 6Lpm of 02 and SP02 increased to 96%. Educated patient that walking to the bathroom was not safe at this point.        Pertinent Vitals/Pain Pain Assessment: Faces Faces Pain Scale: Hurts little more Pain Location: left knee and leg Pain Descriptors / Indicators: Burning Pain Intervention(s): Monitored during session;Repositioned    Home Living                      Prior Function            PT Goals (current goals can now be found in the care plan section) Acute Rehab PT Goals Patient Stated Goal: to be more independent PT Goal Formulation: With patient Time For Goal Achievement: 06/17/16 Potential to Achieve Goals: Good Progress towards PT goals: Progressing toward goals    Frequency  Min 2X/week    PT Plan Current plan remains appropriate    Co-evaluation             End of Session Equipment Utilized During Treatment: Gait belt;Oxygen Activity Tolerance: Patient tolerated treatment well;Patient limited by fatigue;Patient limited by pain Patient left: in chair;with call bell/phone within reach     Time: 1419-1510 PT Time Calculation (min) (ACUTE ONLY): 51 min  Charges:  $Therapeutic Activity: 38-52 mins                    G Codes:      Akiko Schexnider June 19, 2016, 5:07 PM Chyrel Masson, SPT

## 2016-06-05 NOTE — Progress Notes (Signed)
[facility]                                                                              Patient Demographics  Antonio [name], is a 65 y.o. male, DOB - 11-09-1950, RWE:[id number]  Admit date - 06/01/2016   Admitting Physician Lily Kocher, MD  Outpatient Primary MD for the patient is [name], MD  Outpatient specialists:   LOS - 4  days    Chief Complaint  Patient presents with  . Shortness of Breath       Brief summary   Antonio [name] is a 65 y.o. gentleman with a history of diastolic heart failure, HTN, HLD, DM, bioprosthetic aortic valve replacement, chronic respiratory failure with O2 dependence at baseline (4L [place]), and gout who presents to the ED complaining of progressive shortness of breath and DOE.  He reported 16 lb weight gain in the past two weeks, increased abdominal girth and increased lower extremity edema.  No chest pain. He also complained of increasing left knee pain and swelling due to gout flare.   The patient required BiPAP upon arrival for acute hypoxia.  Chest xray showed cardiomegaly with interstitial pulmonary edema and small bilateral effusion. He significantly improved with BiPAP and aggressive IV diuresis. Cardiology/CHF team is managing.   Assessment & Plan    Acute exacerbation of chronic diastolic heart failure - Bipap off - Currently on aggressive IV diuresis 80 mg every 12 hours, negative balance of 10.4 L. Weight 414 lbs -> 400(per patient his dry weight is 360-370's lbs) - Outpatient he is on torsemide 80 mg twice a day. - cardiology/CHF team is following closely - Wound care consult for the right lower extremity   Acute gout flare in left knee-improving - Patient refused right knee x-ray, improved significantly after prednisone x3days, colchicine and uloric --elevated ESR, CRP, uric acid  DM uncontrolled, with hyperglycemia likely secondary to steroids -- Continue current insulin regimen, prednisone OFF now,  expecting better glycemic control  -- Aggressive sliding scale due to obesity and steroid use  Morbid obesity - Patient counseled on diet and weight control  Hypertension -BP currently stable   Chronic kidney disease stage 3 - Baseline creatinine 1.1-2, Cr 1.3  - Follow closely with diuresis  OSA on CPAP  Code Status:Full code DVT Prophylaxis:  Lovenox Family Communication: Discussed in detail with the patient, all imaging results, lab results explained to the patient  Disposition Plan:   Time Spent in minutes   25 minutes  Procedures:  CXR  Consultants:   cardiology  Antimicrobials:      Medications  Scheduled Meds: . ammonium lactate   Topical BID  . antiseptic oral rinse  7 mL Mouth Rinse BID  . aspirin EC  81 mg Oral Daily  . atorvastatin  40 mg Oral q1800  . colchicine  0.6 mg Oral Daily  . enoxaparin (LOVENOX) injection  90 mg Subcutaneous Daily  . febuxostat  40 mg Oral Daily  . fenofibrate  54 mg Oral Daily  . furosemide  80 mg Intravenous Q12H  . insulin aspart  0-20 Units Subcutaneous TID WC  .  insulin aspart  0-5 Units Subcutaneous QHS  . insulin aspart  15 Units Subcutaneous TID WC  . insulin glargine  60 Units Subcutaneous BID  . lisinopril  2.5 mg Oral Daily  . metolazone  5 mg Oral BID  . potassium chloride SA  20 mEq Oral Daily  . sodium chloride flush  3 mL Intravenous Q12H   Continuous Infusions:  PRN Meds:.acetaminophen **OR** acetaminophen, HYDROcodone-acetaminophen, ondansetron **OR** ondansetron (ZOFRAN) IV   Antibiotics   Anti-infectives    None        Subjective:   [name] was seen and examined today. Left knee/gout is improving. Good diuresis. No chest pain. Shortness of breath is improving. No fevers or chills. Patient denies dizziness,abdominal pain, N/V/D/C, new weakness, numbess, tingling. No acute events overnight.    Objective:   Vitals:   06/04/16 2026 06/05/16 0003 06/05/16 0524 06/05/16 0756  BP: (!)  162/82 139/81 (!) 166/103   Pulse: (!) 104 97 93 92  Resp: '17 18 16 17  '$ Temp: 98 F (36.7 C) 98.1 F (36.7 C) 98 F (36.7 C) 98.3 F (36.8 C)  TempSrc: Oral Oral Oral Oral  SpO2: 91% 92% 93% 95%  Weight:   (!) 181.5 kg (400 lb 3.2 oz)   Height:        Intake/Output Summary (Last 24 hours) at 06/05/16 1139 Last data filed at 06/05/16 0944  Gross per 24 hour  Intake             1972 ml  Output             5400 ml  Net            -3428 ml     Wt Readings from Last 3 Encounters:  06/05/16 (!) 181.5 kg (400 lb 3.2 oz)  05/03/16 (!) 176 kg (388 lb)  04/03/16 (!) 174.4 kg (384 lb 8 oz)     Exam  General: Alert and oriented x 3, NAD  HEENT:    Neck: Supple + JVD  Cardiovascular: S1 S2 clear, RRR  Respiratory: Decreased breath sounds at the bases   Gastrointestinal: Morbidly obese Soft, NT, NBS, ND, + bowel sounds  Ext: no cyanosis clubbing, 2+ edema with right LE fluid blisters, able to flex left knee   Neuro: no new neuro deficits   Skin: No rashes  Psych: Normal affect and demeanor, alert and oriented x3    Data Reviewed:  I have personally reviewed following labs and imaging studies  Micro Results Recent Results (from the past 240 hour(s))  MRSA PCR Screening     Status: None   Collection Time: 06/02/16 12:02 AM  Result Value Ref Range Status   MRSA by PCR NEGATIVE NEGATIVE Final    Comment:        The GeneXpert MRSA Assay (FDA approved for NASAL specimens only), is one component of a comprehensive MRSA colonization surveillance program. It is not intended to diagnose MRSA infection nor to guide or monitor treatment for MRSA infections.     Radiology Reports Dg Chest Portable 1 View  Result Date: 06/01/2016 CLINICAL DATA:  Patient with shortness of breath. EXAM: PORTABLE CHEST 1 VIEW COMPARISON:  Chest radiograph 02/23/2016 FINDINGS: Low lung volumes. Marked cardiomegaly. Monitoring leads overlie the patient. Status post median sternotomy.  Pulmonary vascular redistribution and new bilateral interstitial pulmonary opacities. Probable small bilateral pleural effusions. IMPRESSION: Cardiomegaly and interstitial pulmonary edema with small bilateral pleural effusions. Markedly low lung volumes. Electronically Signed  By: Lovey Newcomer M.D.   On: 06/01/2016 21:25    Lab Data:  CBC:  Recent Labs Lab 06/01/16 2054 06/02/16 1059  WBC 13.7* 11.9*  NEUTROABS 10.0*  --   HGB 11.5* 10.9*  HCT 40.0 37.4*  MCV 87.7 87.6  PLT 270 952   Basic Metabolic Panel:  Recent Labs Lab 06/01/16 2054 06/02/16 1059 06/03/16 0406 06/04/16 0516 06/05/16 0810  NA 137 139 138 141 141  K 4.5 4.6 4.8 4.4 3.4*  CL 89* 91* 87* 85* 82*  CO2 41* 41* 43* 49* >50*  GLUCOSE 346* 417* 437* 230* 129*  BUN 31* 30* 33* 37* 36*  CREATININE 1.40* 1.29* 1.36* 1.21 1.02  CALCIUM 9.1 8.9 9.0 9.1 9.3  MG  --   --   --   --  2.3   GFR: Estimated Creatinine Clearance: 120.3 mL/min (by C-G formula based on SCr of 1.02 mg/dL). Liver Function Tests:  Recent Labs Lab 06/01/16 2054  AST 27  ALT 19  ALKPHOS 102  BILITOT 0.7  PROT 7.1  ALBUMIN 3.1*   No results for input(s): LIPASE, AMYLASE in the last 168 hours. No results for input(s): AMMONIA in the last 168 hours. Coagulation Profile: No results for input(s): INR, PROTIME in the last 168 hours. Cardiac Enzymes:  Recent Labs Lab 06/02/16 0019 06/02/16 1059 06/02/16 1415  TROPONINI 0.03* 0.06* 0.04*   BNP (last 3 results) No results for input(s): PROBNP in the last 8760 hours. HbA1C: No results for input(s): HGBA1C in the last 72 hours. CBG:  Recent Labs Lab 06/04/16 0732 06/04/16 1129 06/04/16 1631 06/04/16 2024 06/05/16 0730  GLUCAP 175* 165* 386* 353* 151*   Lipid Profile: No results for input(s): CHOL, HDL, LDLCALC, TRIG, CHOLHDL, LDLDIRECT in the last 72 hours. Thyroid Function Tests: No results for input(s): TSH, T4TOTAL, FREET4, T3FREE, THYROIDAB in the last 72  hours. Anemia Panel: No results for input(s): VITAMINB12, FOLATE, FERRITIN, TIBC, IRON, RETICCTPCT in the last 72 hours. Urine analysis:    Component Value Date/Time   COLORURINE YELLOW 02/23/2016 0525   APPEARANCEUR TURBID (A) 02/23/2016 0525   LABSPEC 1.013 02/23/2016 0525   PHURINE 5.5 02/23/2016 0525   GLUCOSEU NEGATIVE 02/23/2016 0525   HGBUR SMALL (A) 02/23/2016 0525   HGBUR negative 06/12/2007 0843   BILIRUBINUR NEGATIVE 02/23/2016 0525   KETONESUR NEGATIVE 02/23/2016 0525   PROTEINUR 30 (A) 02/23/2016 0525   UROBILINOGEN 0.2 06/15/2015 0029   NITRITE NEGATIVE 02/23/2016 0525   LEUKOCYTESUR TRACE (A) 02/23/2016 8413     Natahsa Marian M.D. [facility] 06/05/2016, 11:39 AM  Pager: [phone] Between 7am to 7pm - call Pager - [phone]  After 7pm go to [email or web address] - password [email or web address]  Call night coverage person covering after 7pm

## 2016-06-05 NOTE — Progress Notes (Signed)
Advanced Heart Failure Rounding Note  Referring Physician: Dr Blake Divine Primary Physician: Dr Leonette Most [facility]) Primary Cardiologist:  Dr. Shirlee Latch   Subjective:    Feeling good today.  Peeing a lot.  Says "all my kidneys wants are water.".  Doesn't really watch food or fluid at home.  He says that he does, but when asked to describe his meals are heavy in salt and has large fluid intake.  Diuresis picking up. Out 3.5 L and weight shows down 11 lbs. BMET pending.   Echo 06/02/16 LVEF 55-60%, moderate LVH  Objective:   Weight Range: (!) 400 lb 3.2 oz (181.5 kg) Body mass index is 55.82 kg/m.   Vital Signs:   Temp:  [98 F (36.7 C)-98.4 F (36.9 C)] 98 F (36.7 C) (08/08 0524) Pulse Rate:  [93-104] 93 (08/08 0524) Resp:  [16-20] 16 (08/08 0524) BP: (131-166)/(68-103) 166/103 (08/08 0524) SpO2:  [90 %-94 %] 93 % (08/08 0524) Weight:  [400 lb 3.2 oz (181.5 kg)] 400 lb 3.2 oz (181.5 kg) (08/08 0524) Last BM Date: 06/04/16  Weight change: Filed Weights   06/03/16 0421 06/04/16 0500 06/05/16 0524  Weight: (!) 414 lb 9.6 oz (188.1 kg) (!) 411 lb 4.8 oz (186.6 kg) (!) 400 lb 3.2 oz (181.5 kg)    Intake/Output:   Intake/Output Summary (Last 24 hours) at 06/05/16 0730 Last data filed at 06/05/16 0441  Gross per 24 hour  Intake             1852 ml  Output             5400 ml  Net            -3548 ml     Physical Exam: General:  No resp difficulty HEENT: normal Neck: supple. JVP difficult to assess due to body habitus . Carotids 2+ bilat; no bruits. No thyromegaly or lymphadenopathy  Appreciated.  Cor: PMI nondisplaced. RRR. No rubs, gallops, murmurs.  Lungs: Diminished throughout, ? Mild basilar crackles. Abdomen: soft, non-tender, non-distended, no HSM. No bruits or masses. +BS  Extremities: no cyanosis, clubbing, rash, 1-2+ edema to knees. R leg with several areas of bullae  Neuro: alert & orientedx3, cranial nerves grossly intact. moves all 4 extremities w/o  difficulty. Affect pleasant  Telemetry: Reviewed, Normal Sinus 90s   Labs: CBC  Recent Labs  06/02/16 1059  WBC 11.9*  HGB 10.9*  HCT 37.4*  MCV 87.6  PLT 252   Basic Metabolic Panel  Recent Labs  06/03/16 0406 06/04/16 0516  NA 138 141  K 4.8 4.4  CL 87* 85*  CO2 43* 49*  GLUCOSE 437* 230*  BUN 33* 37*  CREATININE 1.36* 1.21  CALCIUM 9.0 9.1   Liver Function Tests No results for input(s): AST, ALT, ALKPHOS, BILITOT, PROT, ALBUMIN in the last 72 hours. No results for input(s): LIPASE, AMYLASE in the last 72 hours. Cardiac Enzymes  Recent Labs  06/02/16 1059 06/02/16 1415  TROPONINI 0.06* 0.04*    BNP: BNP (last 3 results)  Recent Labs  02/08/16 1513 04/03/16 1252 06/01/16 2054  BNP 29.9 23.9 56.7    ProBNP (last 3 results) No results for input(s): PROBNP in the last 8760 hours.   D-Dimer No results for input(s): DDIMER in the last 72 hours. Hemoglobin A1C No results for input(s): HGBA1C in the last 72 hours. Fasting Lipid Panel No results for input(s): CHOL, HDL, LDLCALC, TRIG, CHOLHDL, LDLDIRECT in the last 72 hours. Thyroid Function Tests No results  for input(s): TSH, T4TOTAL, T3FREE, THYROIDAB in the last 72 hours.  Invalid input(s): FREET3  Other results:     Imaging/Studies:  No results found.  Latest Echo  Latest Cath   Medications:     Scheduled Medications: . ammonium lactate   Topical BID  . antiseptic oral rinse  7 mL Mouth Rinse BID  . aspirin EC  81 mg Oral Daily  . atorvastatin  40 mg Oral q1800  . colchicine  0.6 mg Oral Daily  . enoxaparin (LOVENOX) injection  90 mg Subcutaneous Daily  . febuxostat  40 mg Oral Daily  . fenofibrate  54 mg Oral Daily  . furosemide  80 mg Intravenous Q12H  . insulin aspart  0-20 Units Subcutaneous TID WC  . insulin aspart  0-5 Units Subcutaneous QHS  . insulin aspart  15 Units Subcutaneous TID WC  . insulin glargine  60 Units Subcutaneous BID  . metolazone  5 mg Oral BID    . potassium chloride SA  20 mEq Oral Daily  . sodium chloride flush  3 mL Intravenous Q12H    Infusions:    PRN Medications: acetaminophen **OR** acetaminophen, HYDROcodone-acetaminophen, ondansetron **OR** ondansetron (ZOFRAN) IV   Assessment/Plan   He remains at least 20 lbs from baseline weight, but diuresing well on 80 mg IV lasix with metolazone 5 mg BID ( out 10 lbs overnight).    Supp K as needed.  BMET pending for today.  Can switch to lasix gtt if diuresis slows.    He has previously been on ACEi. Previously held with hypotension in the setting of over diuresis. Will restart lisinopril 2.5 mg daily with hypertension and DM. Watch renal function closely.   BP likely elevated in setting of steroids.  Have previously been soft. Gout per Primary.   Appreciate WOC input for RLE.    Length of Stay: 4   [name] PA-C 06/05/2016, 7:30 AM  Advanced Heart Failure Team Pager [phone] (M-F; 7a - 4p)  Please contact [facility] for night-coverage after hours (4p -7a ) and weekends on [email or web address]  Patient seen and examined with Otilio Saber, PA-C. We discussed all aspects of the encounter. I agree with the assessment and plan as stated above.   He is diuresing well. Down about 12 pounds but he is eating a lot. BP is high. Will restart lisinopril. Gout improving. Supp K.   Bensimhon, Daniel,MD 8:40 AM

## 2016-06-05 NOTE — Care Management Note (Addendum)
Case Management Note  Patient Details  Name: Antonio [name] MRN: [id number] Date of Birth: 1951/01/12  Subjective/Objective:    Pt presented with CHF exacerbation. Initiated on IV Lasix. Per PT recommendations for SNF. CSW is aware.               Action/Plan: CM will continue to monitor for disposition needs.   Expected Discharge Date:                  Expected Discharge Plan:  Skilled Nursing Facility  In-House Referral:  Clinical Social Work  Discharge planning Services  CM Consult  Post Acute Care Choice:   N/A Choice offered to:   N/A  DME Arranged:   N/A DME Agency: N/A   HH Arranged:  N/A HH Agency: N/A   Status of Service: Completed If discussed at Long Length of Stay Meetings, dates discussed:    Additional Comments: Plan will be for SNF once stable for d/c. No further needs from CM at this time.  Gala Lewandowsky, RN 06/05/2016, 4:21 PM

## 2016-06-05 NOTE — Care Management Important Message (Signed)
Important Message  Patient Details  Name: Antonio [name] MRN: [id number] Date of Birth: Feb 26, 1951   Medicare Important Message Given:  Yes    Antonio [name] 06/05/2016, 10:09 AM

## 2016-06-06 DIAGNOSIS — E669 Obesity, unspecified: Secondary | ICD-10-CM

## 2016-06-06 DIAGNOSIS — I509 Heart failure, unspecified: Secondary | ICD-10-CM

## 2016-06-06 DIAGNOSIS — E1165 Type 2 diabetes mellitus with hyperglycemia: Secondary | ICD-10-CM

## 2016-06-06 LAB — GLUCOSE, CAPILLARY
GLUCOSE-CAPILLARY: 79 mg/dL (ref 65–99)
GLUCOSE-CAPILLARY: 141 mg/dL — AB (ref 65–99)
Glucose-Capillary: 206 mg/dL — ABNORMAL HIGH (ref 65–99)
Glucose-Capillary: 129 mg/dL — ABNORMAL HIGH (ref 65–99)

## 2016-06-06 LAB — BASIC METABOLIC PANEL
BUN: 42 mg/dL — AB (ref 6–20)
CO2: >50 mmol/L — ABNORMAL HIGH (ref 22–32)
CREATININE: 1.28 mg/dL — AB (ref 0.61–1.24)
Calcium: 9.3 mg/dL (ref 8.9–10.3)
Chloride: 80 mmol/L — ABNORMAL LOW (ref 101–111)
GFR calc Af Amer: >60 mL/min (ref >60)
GFR, EST NON AFRICAN AMERICAN: 57 mL/min — AB (ref >60)
GLUCOSE: 51 mg/dL — AB (ref 65–99)
POTASSIUM: 3.9 mmol/L (ref 3.5–5.1)
SODIUM: 141 mmol/L (ref 135–145)

## 2016-06-06 MED ORDER — INSULIN GLARGINE 100 UNIT/ML ~~LOC~~ SOLN
50.0000 [IU] | Freq: Two times a day (BID) | SUBCUTANEOUS | Status: DC
  Administered 2016-06-06 – 2016-06-07 (×2): 50 [IU] via SUBCUTANEOUS
  Filled 2016-06-06 (×4): qty 0.5

## 2016-06-06 NOTE — Clinical Social Work Note (Signed)
Clinical Social Work Assessment  Patient Details  Name: Antonio [name] MRN: [id number] Date of Birth: May 11, 1951  Date of referral:  06/06/16               Reason for consult:  Discharge Planning, Facility Placement                Permission sought to share information with:  Facility Sport and exercise psychologist, Family Supports Permission granted to share information::  Yes, Verbal Permission Granted  Name::     Patient states that he will be communicating with his family.  Agency::  SNFs  Relationship::     Contact Information:     Housing/Transportation Living arrangements for the past 2 months:  Single Family Home Source of Information:  Patient Patient Interpreter Needed:  None Criminal Activity/Legal Involvement Pertinent to Current Situation/Hospitalization:  No - Comment as needed Significant Relationships:  Friend Lives with:  Self Do you feel safe going back to the place where you live?  Yes Need for family participation in patient care:  No (Coment)  Care giving concerns:  The patient reports no care giving concerns but does agree with recommendation to go to SNF at discharge.   Social Worker assessment / plan:  CSW met with patient at bedside to complete assessment. The patient shares that he has been to [facility] in the past for rehab and used all of his days at the facility. The patient believes he has had his 60 day wellness period so his medicate days should have reset. The patient states that he would prefer to go to [facility] at time of discharge. CSW explained SNF search/placement process. CSW will followup with patient regarding his SNF days and bed offers.   Employment status:  Retired Forensic scientist:  [employer] PT Recommendations:  [place] / Referral to community resources:  [place]  Patient/Family's Response to care:  The patient is pleasant and appreciative of the care he has received. He appreciates CSW  assistance and involvement.  Patient/Family's Understanding of and Emotional Response to Diagnosis, Current Treatment, and Prognosis:  The patient appears to have a good understanding of his diagnosis and reason for hospitalization. The patient understands that he will need continued rehab at discharge. He appears to be coping well with this hospitalization.   Emotional Assessment Appearance:  Appears stated age Attitude/Demeanor/Rapport:  Other (Patient appropriate and welcoming) Affect (typically observed):  Accepting, Appropriate, Calm, Pleasant Orientation:  Oriented to Self, Oriented to Place, Oriented to  Time, Oriented to Situation Alcohol / Substance use:  Not Applicable Psych involvement (Current and /or in the community):  No (Comment)  Discharge Needs  Concerns to be addressed:  Discharge Planning Concerns, Care Coordination Readmission within the last 30 days:  No Current discharge risk:  Chronically ill, Physical Impairment Barriers to Discharge:  Continued Medical Work up   Antonio [name] B, LCSW 06/06/2016, 9:59 AM

## 2016-06-06 NOTE — Care Management Important Message (Signed)
Important Message  Patient Details  Name: Antonio [name] MRN: [id number] Date of Birth: [birth year]   Medicare Important Message Given:  Yes    Bernadette Hoit 06/06/2016, 3:04 PM

## 2016-06-06 NOTE — NC FL2 (Signed)
[place] MEDICAID FL2 LEVEL OF CARE SCREENING TOOL     IDENTIFICATION  Patient Name: Antonio [name] Birthdate: 1951-07-31 Sex: male Admission Date (Current Location): 06/01/2016  [facility] and [place] Number:  Producer, television/film/video and Address:  [facility], [address], [place], [place] [address]      Provider Number: [id number]  Attending Physician Name and Address:  Zannie Cove, MD  Relative Name and Phone Number:       Current Level of Care: Hospital Recommended Level of Care: Skilled Nursing Facility Prior Approval Number:    Date Approved/Denied:   PASRR Number: [id number]  Discharge Plan: SNF    Current Diagnoses: Patient Active Problem List   Diagnosis Date Noted  . Knee pain, acute   . CHF (congestive heart failure) (HCC) 06/01/2016  . Acute on chronic congestive heart failure (HCC) 06/01/2016  . AKI (acute kidney injury) (HCC)   . Hypotension 02/23/2016  . Nausea and vomiting 02/23/2016  . Acute on chronic diastolic (congestive) heart failure (HCC)   . Acute renal failure superimposed on stage 3 chronic kidney disease (HCC) 02/08/2016  . Diarrhea 02/08/2016  . Dehydration 02/08/2016  . Acute-on-chronic kidney injury (HCC) 02/08/2016  . CKD (chronic kidney disease), stage III 01/20/2016  . Obesity hypoventilation syndrome (HCC)   . Insulin dependent diabetes mellitus (HCC)   . HCAP (healthcare-associated pneumonia)   . OSA on CPAP   . Morbid obesity (HCC)   . PNA (pneumonia) 01/03/2016  . Respiratory failure (HCC) 01/01/2016  . Shortness of breath   . Wound of right leg 11/03/2015  . Chronic diastolic CHF (congestive heart failure) (HCC) 09/08/2015  . S/P aortic valve replacement with bioprosthetic valve 02/22/2015  . PAF (paroxysmal atrial fibrillation) (HCC) 12/08/2011  . Sleep apnea 04/30/2011  . ABNORMAL CV (STRESS) TEST 11/15/2010  . Aortic stenosis 09/26/2010  . Hyperlipidemia 12/26/2006  . Gout 12/26/2006  .  OBESITY, NOS 12/26/2006  . HYPERTENSION, BENIGN SYSTEMIC 12/26/2006  . EDEMA-LEGS,DUE TO VENOUS OBSTRUCT. 12/26/2006  . OSTEOARTHRITIS OF SPINE, NOS 12/26/2006    Orientation RESPIRATION BLADDER Height & Weight     Self, Time, Situation, Place  O2 (6L, attempting to wean, 4L chronic at home.) Continent Weight: (!) 181.5 kg (400 lb 3.2 oz) Height:   (180.3 cm)  BEHAVIORAL SYMPTOMS/MOOD NEUROLOGICAL BOWEL NUTRITION STATUS      Continent Diet (Carb Modified, thin liquids, 1200 mL fluid restriction)  AMBULATORY STATUS COMMUNICATION OF NEEDS Skin     Verbally Normal                       Personal Care Assistance Level of Assistance  Bathing, Dressing, Feeding Bathing Assistance: Limited assistance Feeding assistance: Independent Dressing Assistance: Limited assistance     Functional Limitations Info  Sight, Hearing, Speech Sight Info: Adequate Hearing Info: Adequate Speech Info: Adequate    SPECIAL CARE FACTORS FREQUENCY  PT (By licensed PT), OT (By licensed OT)     PT Frequency: 5 OT Frequency: 5            Contractures Contractures Info: Not present    Additional Factors Info  Isolation Precautions Code Status Info: Full Code Allergies Info: Other   Insulin Sliding Scale Info: 4x/day Isolation Precautions Info: 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  acetaminophen (TYLENOL) tablet 650 mg  650 mg Oral Q6H PRN Michael Litter, MD   650 mg at 06/02/16 1136   Or  . acetaminophen (TYLENOL) suppository 650 mg  650 mg Rectal Q6H PRN Michael Litter, MD      . ammonium lactate (LAC-HYDRIN) 12 % lotion   Topical BID Ripudeep K Rai, MD      . antiseptic oral rinse (CPC / CETYLPYRIDINIUM CHLORIDE 0.05%) solution 7 mL  7 mL Mouth Rinse BID Michael Litter, MD   7 mL at 06/05/16 1026  . aspirin EC  tablet 81 mg  81 mg Oral Daily Michael Litter, MD   81 mg at 06/06/16 0945  . atorvastatin (LIPITOR) tablet 40 mg  40 mg Oral q1800 Michael Litter, MD   40 mg at 06/05/16 1759  . colchicine tablet 0.6 mg  0.6 mg Oral Daily Michael Litter, MD   0.6 mg at 06/06/16 0945  . enoxaparin (LOVENOX) injection 90 mg  90 mg Subcutaneous Daily Michael Litter, MD   90 mg at 06/06/16 0946  . febuxostat (ULORIC) tablet 40 mg  40 mg Oral Daily Michael Litter, MD   40 mg at 06/06/16 0945  . fenofibrate tablet 54 mg  54 mg Oral Daily Michael Litter, MD   54 mg at 06/06/16 0945  . furosemide (LASIX) injection 80 mg  80 mg Intravenous Q12H Michael Litter, MD   80 mg at 06/06/16 0551  . insulin aspart (novoLOG) injection 0-20 Units  0-20 Units Subcutaneous TID WC Michael Litter, MD   4 Units at 06/05/16 1222  . insulin aspart (novoLOG) injection 0-5 Units  0-5 Units Subcutaneous QHS Michael Litter, MD   5 Units at 06/04/16 2155  . insulin aspart (novoLOG) injection 15 Units  15 Units Subcutaneous TID WC Ripudeep Jenna Luo, MD   15 Units at 06/05/16 1222  . insulin glargine (LANTUS) injection 60 Units  60 Units Subcutaneous BID Ripudeep Jenna Luo, MD   60 Units at 06/06/16 0551  . metolazone (ZAROXOLYN) tablet 5 mg  5 mg Oral BID Dolores Patty, MD   5 mg at 06/06/16 0945  . ondansetron (ZOFRAN) tablet 4 mg  4 mg Oral Q6H PRN Michael Litter, MD       Or  . ondansetron [facility]) injection 4 mg  4 mg Intravenous Q6H PRN Michael Litter, MD      . oxyCODONE-acetaminophen (PERCOCET/ROXICET) 5-325 MG per tablet 1-2 tablet  1-2 tablet Oral Q4H PRN Leanne Chang, NP   1 tablet at 06/05/16 2211  . potassium chloride SA (K-DUR,KLOR-CON) CR tablet 20 mEq  20 mEq Oral Daily Dolores Patty, MD   20 mEq at 06/06/16 0945  . sodium chloride flush (NS) 0.9 % injection 3 mL  3 mL Intravenous Q12H Michael Litter, MD   3 mL at 06/05/16 2212     Discharge Medications: Please see discharge summary for a list of discharge medications.  Relevant Imaging  Results:  Relevant Lab Results:   Additional Information SSN:  [id number]  Venita Lick, LCSW

## 2016-06-06 NOTE — Progress Notes (Signed)
OT Cancellation Note  Patient Details Name: Antonio [name] MRN: [id number] DOB: Aug 15, 1951   Cancelled Treatment:    Reason Eval/Treat Not Completed: Pain limiting ability to participate. Pt reported increased knee pain and requested therapist come back tomorrow. Will check back tomorrow if schedule allows.  Nils Pyle, OTR/L Pager: [phone] 06/06/2016, 3:23 PM

## 2016-06-06 NOTE — Progress Notes (Signed)
Inpatient Diabetes Program Recommendations  AACE/ADA: New Consensus Statement on Inpatient Glycemic Control (2015)  Target Ranges:  Prepandial:   less than 140 mg/dL      Peak postprandial:   less than 180 mg/dL (1-2 hours)      Critically ill patients:  140 - 180 mg/dL   Lab Results  Component Value Date   GLUCAP 206 (H) 06/06/2016   HGBA1C 10.0 (H) 02/09/2016    Review of Glycemic Control  Results for CUBA, SUMROW (MRN [id number]) as of 06/06/2016 12:53  Ref. Range 06/05/2016 08:10 06/05/2016 11:33 06/05/2016 16:23 06/05/2016 16:46 06/05/2016 21:10 06/06/2016 04:18 06/06/2016 07:22 06/06/2016 11:43  Glucose-Capillary Latest Ref Range: 65 - 99 mg/dL  725 (H) 56 (L) 86 366 (H)  79 206 (H)    Diabetes history: Type 2 Outpatient Diabetes medications: Basaglar 50 units bid, Novolog 30-40 units tid based on a sliding scale Current orders for Inpatient glycemic control: Lantus 60 units bid, Novolog 15 units tid with meals, Novolog resistant correction scale 0-20 units tid, Novolog 0-5 units qhs  Inpatient Diabetes Program Recommendations:   Consider decreasing the Lantus insulin to 50 units bid- note he had a low blood sugar on the 8th and this morning.  Susette Racer, RN, BA, MHA, CDE Diabetes Coordinator Inpatient Diabetes Program  [phone] (Team Pager) [phone] [facility] Office) 06/06/2016 12:54 PM

## 2016-06-06 NOTE — Progress Notes (Signed)
Pt states that the machine freaks him out and that he does not want to wear it.

## 2016-06-06 NOTE — Progress Notes (Signed)
[facility]                                                                              Patient Demographics  Antonio [name], is a 65 y.o. male, DOB - 04-12-1951, HUD:[id number]  Admit date - 06/01/2016   Admitting Physician Lily Kocher, MD  Outpatient Primary MD for the patient is [name], MD  Outpatient specialists:   LOS - 5  days    Chief Complaint  Patient presents with  . Shortness of Breath       Brief summary   Antonio [name] is a 65 y.o. gentleman with a history of diastolic heart failure, HTN, HLD, DM, bioprosthetic aortic valve replacement, chronic respiratory failure with O2 dependence at baseline (4L [place]), and gout who presents to the ED complaining of progressive shortness of breath and DOE.  He reported 16 lb weight gain in the past two weeks, increased abdominal girth and increased lower extremity edema.  No chest pain. He also complained of increasing left knee pain and swelling due to gout flare.   The patient required BiPAP upon arrival for acute hypoxia.  Chest xray showed cardiomegaly with interstitial pulmonary edema and small bilateral effusion. He significantly improved with BiPAP and aggressive IV diuresis. Cardiology/CHF team is following.   Assessment & Plan    Acute exacerbation of chronic diastolic heart failure - required BIPAP on admission - Currently on IV lasix 80 mg every 12 hours, negative 11.7 L. Weight 414 lbs -> 400(per patient his dry weight is 360-370's lbs), held low dose ACE due to mild bump in creatinine - Outpatient he is on torsemide 80 mg twice a day. - cardiology/CHF team is following closely - Wound care consult for the right lower extremity - very poor motivation overall  Acute gout flare in left knee-improving - Patient refused right knee x-ray, improved significantly after prednisone x3days, colchicine and uloric --elevated ESR, CRP, uric acid  DM uncontrolled, with hyperglycemia likely  secondary to steroids --CBgs fluctuating, will cut down lantus a bit  Morbid obesity - Patient counseled on diet and weight control  Hypertension -BP currently stable   Chronic kidney disease stage 3 - Baseline creatinine 1.1-2, Cr 1.3  - held ACE, monitor  OSA on CPAP  Code Status:Full code DVT Prophylaxis:  Lovenox Family Communication: Discussed in detail with the patient, all imaging results, lab results explained to the patient  Disposition Plan: SNF in ?2days  Time Spent in minutes   25 minutes  Procedures:  CXR  Consultants:   cardiology  Antimicrobials:      Medications  Scheduled Meds: . ammonium lactate   Topical BID  . antiseptic oral rinse  7 mL Mouth Rinse BID  . aspirin EC  81 mg Oral Daily  . atorvastatin  40 mg Oral q1800  . colchicine  0.6 mg Oral Daily  . enoxaparin (LOVENOX) injection  90 mg Subcutaneous Daily  . febuxostat  40 mg Oral Daily  . fenofibrate  54 mg Oral Daily  . furosemide  80 mg Intravenous Q12H  . insulin aspart  0-20 Units Subcutaneous TID WC  .  insulin aspart  0-5 Units Subcutaneous QHS  . insulin aspart  15 Units Subcutaneous TID WC  . insulin glargine  60 Units Subcutaneous BID  . metolazone  5 mg Oral BID  . potassium chloride SA  20 mEq Oral Daily  . sodium chloride flush  3 mL Intravenous Q12H   Continuous Infusions:  PRN Meds:.acetaminophen **OR** acetaminophen, ondansetron **OR** ondansetron (ZOFRAN) IV, oxyCODONE-acetaminophen   Antibiotics   Anti-infectives    None        Subjective:   Overall better now, O2 came off last night and became delirious  Objective:   Vitals:   06/05/16 1945 06/06/16 0043 06/06/16 0509 06/06/16 0803  BP: 137/80 125/80 (!) 112/55 122/74  Pulse: 91 86 83 96  Resp: _0 (!) 21  Temp: 98.4 F (36.9 C) 97.6 F (36.4 C) 97.9 F (36.6 C) 98 F (36.7 C)  TempSrc: Oral Oral Oral Oral  SpO2: 94% 98% 98% 95%  Weight:      Height:        Intake/Output Summary  (Last 24 hours) at 06/06/16 1139 Last data filed at 06/06/16 0900  Gross per 24 hour  Intake             1560 ml  Output             3050 ml  Net            -1490 ml     Wt Readings from Last 3 Encounters:  06/05/16 (!) 181.5 kg (400 lb 3.2 oz)  05/03/16 (!) 176 kg (388 lb)  04/03/16 (!) 174.4 kg (384 lb 8 oz)     Exam  General: Alert and oriented x 3, NAD  HEENT:    Neck: Supple + JVD  Cardiovascular: S1 S2 clear, RRR  Respiratory: Decreased breath sounds at the bases   Gastrointestinal: Morbidly obese Soft, NT, NBS, ND, + bowel sounds  Ext: no cyanosis clubbing, 2+ edema with right LE fluid blisters, able to flex left knee   Neuro: no new neuro deficits   Skin: No rashes  Psych: Normal affect and demeanor, alert and oriented x3    Data Reviewed:  I have personally reviewed following labs and imaging studies  Micro Results Recent Results (from the past 240 hour(s))  MRSA PCR Screening     Status: None   Collection Time: 06/02/16 12:02 AM  Result Value Ref Range Status   MRSA by PCR NEGATIVE NEGATIVE Final    Comment:        The GeneXpert MRSA Assay (FDA approved for NASAL specimens only), is one component of a comprehensive MRSA colonization surveillance program. It is not intended to diagnose MRSA infection nor to guide or monitor treatment for MRSA infections.     Radiology Reports Dg Chest Portable 1 View  Result Date: 06/01/2016 CLINICAL DATA:  Patient with shortness of breath. EXAM: PORTABLE CHEST 1 VIEW COMPARISON:  Chest radiograph 02/23/2016 FINDINGS: Low lung volumes. Marked cardiomegaly. Monitoring leads overlie the patient. Status post median sternotomy. Pulmonary vascular redistribution and new bilateral interstitial pulmonary opacities. Probable small bilateral pleural effusions. IMPRESSION: Cardiomegaly and interstitial pulmonary edema with small bilateral pleural effusions. Markedly low lung volumes. Electronically Signed   By: Lovey Newcomer  M.D.   On: 06/01/2016 21:25    Lab Data:  CBC:  Recent Labs Lab 06/01/16 2054 06/02/16 1059  WBC 13.7* 11.9*  NEUTROABS 10.0*  --   HGB 11.5* 10.9*  HCT 40.0 37.4*  MCV  87.7 87.6  PLT 270 961   Basic Metabolic Panel:  Recent Labs Lab 06/02/16 1059 06/03/16 0406 06/04/16 0516 06/05/16 0810 06/06/16 0418  NA 139 138 141 141 141  K 4.6 4.8 4.4 3.4* 3.9  CL 91* 87* 85* 82* 80*  CO2 41* 43* 49* >50* >50*  GLUCOSE 417* 437* 230* 129* 51*  BUN 30* 33* 37* 36* 42*  CREATININE 1.29* 1.36* 1.21 1.02 1.28*  CALCIUM 8.9 9.0 9.1 9.3 9.3  MG  --   --   --  2.3  --    GFR: Estimated Creatinine Clearance: 95.9 mL/min (by C-G formula based on SCr of 1.28 mg/dL). Liver Function Tests:  Recent Labs Lab 06/01/16 2054  AST 27  ALT 19  ALKPHOS 102  BILITOT 0.7  PROT 7.1  ALBUMIN 3.1*   No results for input(s): LIPASE, AMYLASE in the last 168 hours. No results for input(s): AMMONIA in the last 168 hours. Coagulation Profile: No results for input(s): INR, PROTIME in the last 168 hours. Cardiac Enzymes:  Recent Labs Lab 06/02/16 0019 06/02/16 1059 06/02/16 1415  TROPONINI 0.03* 0.06* 0.04*   BNP (last 3 results) No results for input(s): PROBNP in the last 8760 hours. HbA1C: No results for input(s): HGBA1C in the last 72 hours. CBG:  Recent Labs Lab 06/05/16 1133 06/05/16 1623 06/05/16 1646 06/05/16 2110 06/06/16 0722  GLUCAP 196* 56* 86 150* 79   Lipid Profile: No results for input(s): CHOL, HDL, LDLCALC, TRIG, CHOLHDL, LDLDIRECT in the last 72 hours. Thyroid Function Tests: No results for input(s): TSH, T4TOTAL, FREET4, T3FREE, THYROIDAB in the last 72 hours. Anemia Panel: No results for input(s): VITAMINB12, FOLATE, FERRITIN, TIBC, IRON, RETICCTPCT in the last 72 hours. Urine analysis:    Component Value Date/Time   COLORURINE YELLOW 02/23/2016 0525   APPEARANCEUR TURBID (A) 02/23/2016 0525   LABSPEC 1.013 02/23/2016 0525   PHURINE 5.5 02/23/2016  0525   GLUCOSEU NEGATIVE 02/23/2016 0525   HGBUR SMALL (A) 02/23/2016 0525   HGBUR negative 06/12/2007 0843   BILIRUBINUR NEGATIVE 02/23/2016 0525   KETONESUR NEGATIVE 02/23/2016 0525   PROTEINUR 30 (A) 02/23/2016 0525   UROBILINOGEN 0.2 06/15/2015 0029   NITRITE NEGATIVE 02/23/2016 0525   LEUKOCYTESUR TRACE (A) 02/23/2016 1643     Adryel,Deniz Eskridge M.D. [facility] 06/06/2016, 11:39 AM  Pager: [phone] Between 7am to 7pm - call Pager - [phone]  After 7pm go to [email or web address] - password [email or web address]  Call night coverage person covering after 7pm

## 2016-06-06 NOTE — Clinical Social Work Placement (Signed)
   CLINICAL SOCIAL WORK PLACEMENT  NOTE  Date:  06/06/2016  Patient Details  Name: Burce Toye MRN: [id number] Date of Birth: April 25, 1951  Clinical Social Work is seeking post-discharge placement for this patient at the Skilled  Nursing Facility level of care (*CSW will initial, date and re-position this form in  chart as items are completed):  Yes   Patient/family provided with [facility] Clinical Social Work Department's list of facilities offering this level of care within the geographic area requested by the patient (or if unable, by the patient's family).  Yes   Patient/family informed of their freedom to choose among providers that offer the needed level of care, that participate in Medicare, Medicaid or managed care program needed by the patient, have an available bed and are willing to accept the patient.  Yes   Patient/family informed of [facility] ownership interest in [facility] and [facility], as well as of the fact that they are under no obligation to receive care at these facilities.  PASRR submitted to EDS on       PASRR number received on       Existing PASRR number confirmed on 06/06/16     FL2 transmitted to all facilities in geographic area requested by pt/family on 06/06/16     FL2 transmitted to all facilities within larger geographic area on       Patient informed that his/her managed care company has contracts with or will negotiate with certain facilities, including the following:            Patient/family informed of bed offers received.  Patient chooses bed at       Physician recommends and patient chooses bed at      Patient to be transferred to   on  .  Patient to be transferred to facility by       Patient family notified on   of transfer.  Name of family member notified:        PHYSICIAN Please prepare priority discharge summary, including medications, Please prepare prescriptions, Please sign FL2     Additional Comment:     _______________________________________________ Venita Lick, LCSW 06/06/2016, 10:14 AM

## 2016-06-06 NOTE — Plan of Care (Signed)
Problem: Education: Goal: Knowledge of [facility] Education information/materials will improve Outcome: Progressing Patient aware of plan of care.  RN provided medication education on all medications administered thus far this shift.  Patient stated understanding.  Patient did complain of left knee pain this shift, medications administered, reassessments completed (see MAR and flowsheets for details).

## 2016-06-06 NOTE — Progress Notes (Signed)
OT Evaluation  Pt motivated to become more independent. Will benefit from rehab at SNF to return to PLOF. Will follow acutely to address established goals and facilitate D/C to SNF.    06/04/16 0900  OT Visit Information  Last OT Received On 06/04/16  Assistance Needed +1 (+2 may be helpful to manage lines esp for gait)  History of Present Illness 65 y.o. gentleman with a history of diastolic heart failure, HTN, HLD, DM, bioprosthetic aortic valve replacement, chronic respiratory failure with O2 dependence at baseline (4L [place]), and gout who presents to the ED complaining of progressive shortness of breath and DOE.  He reports a 16 lb weight gain in the past two weeks.   Precautions  Precautions Fall  Home Living  Family/patient expects to be discharged to: Private residence  Living Arrangements Non-relatives/Friends  Available Help at Discharge Family;Available PRN/intermittently  Type of Home House  Home Access Stairs to enter  Entrance Stairs-Number of Steps 4  Entrance Stairs-Rails Right  Home Layout One level  Bathroom Shower/Tub Walk-in shower;Tub/shower unit;Curtain  Chiropractor - 2 wheels;Walker - 4 wheels;Shower seat;BSC;Grab bars - toilet;Grab bars - tub/shower;Adaptive equipment  Adaptive Equipment Reacher;Long-handled sponge;Other (Comment)  Additional Comments has all the equipment from recent SNF admit  Prior Function  Level of Independence Needs assistance  Gait / Transfers Assistance Needed using RW, fall x2 needing EMS assist to get up  ADL's / Homemaking Assistance Needed assist with bathing  Comments not driving, help to descend 4 steps for MD appts  Communication  Communication No difficulties  Pain Assessment  Pain Assessment No/denies pain  Cognition  Arousal/Alertness Awake/alert  Behavior During Therapy [facility] for tasks assessed/performed (intially agitated due to needs bathroom)   Overall Cognitive Status Within Functional Limits for tasks assessed  Memory (occasional word finding problems)  Upper Extremity Assessment  Upper Extremity Assessment Generalized weakness  Lower Extremity Assessment  Lower Extremity Assessment Defer to PT evaluation  Cervical / Trunk Assessment  Cervical / Trunk Assessment Normal  ADL  Overall ADL's  Needs assistance/impaired  Grooming Set up;Sitting  Upper Body Bathing Sitting;Minimal assitance  Lower Body Bathing Moderate assistance;Sit to/from stand  Upper Body Dressing  Minimal assistance;Sitting  Lower Body Dressing Moderate assistance;Sitting/lateral leans;Sit to/from Scientist, research (life sciences) Minimal assistance;BSC;RW  Toileting- Clothing Manipulation and Hygiene Moderate assistance  Functional mobility during ADLs Min guard;Rolling walker  General ADL Comments Pt states ADL are difficult for him to complete due to his obesity and would like to explore options for AE/compensatory techniques - especially with hygiene after toileting.  Bed Mobility  Overal bed mobility Needs Assistance  Bed Mobility Supine to Sit;Sit to Supine  Supine to sit HOB elevated;Min assist  General bed mobility comments Pt sleeps in electric recliner  Transfers  Overall transfer level Needs assistance  Equipment used Rolling walker (2 wheeled)  Sit to Stand From elevated surface;Min guard  Stand pivot transfers Min guard  General transfer comment refused to wear socks  Balance  Sitting balance-Leahy Scale Good  Standing balance-Leahy Scale Poor  OT - End of Session  Equipment Utilized During Treatment Rolling walker;Oxygen  Activity Tolerance Patient tolerated treatment well  Patient left Other (comment) (BSC - nursing aware)  OT Assessment  OT Therapy Diagnosis  Generalized weakness  OT Recommendation/Assessment Patient needs continued OT Services  OT Problem List Decreased strength;Decreased activity tolerance;Decreased safety  awareness;Decreased knowledge of use of DME or AE;Cardiopulmonary status limiting activity;Obesity;Increased  edema  Barriers to Discharge Decreased caregiver support  OT Plan  OT Frequency (ACUTE ONLY) Min 2X/week  OT Treatment/Interventions (ACUTE ONLY) Self-care/ADL training;Therapeutic exercise;Energy conservation;DME and/or AE instruction;Therapeutic activities;Patient/family education  OT Recommendation  Follow Up Recommendations SNF;Supervision/Assistance - 24 hour  OT Equipment Other (comment) (AE for ADL)  Individuals Consulted  Consulted and Agree with Results and Recommendations Patient  Acute Rehab OT Goals  Patient Stated Goal improved independence with self care  OT Goal Formulation With patient  Time For Goal Achievement 06/18/16  Potential to Achieve Goals Good  OT Time Calculation  OT Start Time (ACUTE ONLY) 0915  OT Stop Time (ACUTE ONLY) 0941  OT Time Calculation (min) 26 min  OT General Charges  $OT Visit 1 Procedure  OT Evaluation  $OT Eval Moderate Complexity 1 Procedure  OT Treatments  $Self Care/Home Management  8-22 mins  Written Expression  Dominant Hand Right

## 2016-06-06 NOTE — Progress Notes (Signed)
Advanced Heart Failure Rounding Note  Referring Physician: Dr Blake Divine Primary Physician: Dr Leonette Most [facility]) Primary Cardiologist:  Dr. Shirlee Latch   Subjective:    No weight for today. Pt refuses standing weights. States it hurts his legs to stand. Denies SOB or CP, but is not very active at all, similar to at home.   Continues to have good diuresis. Out 3.4 L. No weight this am.  Creatinine stable. BUN trending up.   Echo 06/02/16 LVEF 55-60%, moderate LVH  Objective:   Weight Range: (!) 400 lb 3.2 oz (181.5 kg) Body mass index is 55.82 kg/m.   Vital Signs:   Temp:  [97.6 F (36.4 C)-98.4 F (36.9 C)] 98 F (36.7 C) (08/09 0803) Pulse Rate:  [83-97] 96 (08/09 0803) Resp:  [18-21] 21 (08/09 0803) BP: (112-149)/(55-87) 122/74 (08/09 0803) SpO2:  [92 %-98 %] 95 % (08/09 0803) Last BM Date: 06/05/16  Weight change: Filed Weights   06/03/16 0421 06/04/16 0500 06/05/16 0524  Weight: (!) 414 lb 9.6 oz (188.1 kg) (!) 411 lb 4.8 oz (186.6 kg) (!) 400 lb 3.2 oz (181.5 kg)    Intake/Output:   Intake/Output Summary (Last 24 hours) at 06/06/16 0822 Last data filed at 06/06/16 0558  Gross per 24 hour  Intake             1560 ml  Output             2800 ml  Net            -1240 ml     Physical Exam: General:  No resp difficulty HEENT: normal Neck: supple. JVP difficult to assess due to body habitus. Carotids 2+ bilat; no bruits. No thyromegaly or lymphadenopathy  Appreciated.  Cor: PMI nondisplaced. Distant heart sounds. RRR. No M/G/R Lungs: Diminished with mild bibasilar crackles. Abdomen: soft, NT, ND, no HSM. No bruits or masses. +BS  Extremities: no cyanosis, clubbing, rash, 1-2+ edema to knees. R leg with several bullae  Neuro: alert & orientedx3, cranial nerves grossly intact. moves all 4 extremities w/o difficulty. Affect pleasant  Telemetry: reviewed personally, Normal Sinus 80-90s   Labs: CBC No results for input(s): WBC, NEUTROABS, HGB, HCT, MCV, PLT in the  last 72 hours. Basic Metabolic Panel  Recent Labs  06/05/16 0810 06/06/16 0418  NA 141 141  K 3.4* 3.9  CL 82* 80*  CO2 >50* >50*  GLUCOSE 129* 51*  BUN 36* 42*  CREATININE 1.02 1.28*  CALCIUM 9.3 9.3  MG 2.3  --    Liver Function Tests No results for input(s): AST, ALT, ALKPHOS, BILITOT, PROT, ALBUMIN in the last 72 hours. No results for input(s): LIPASE, AMYLASE in the last 72 hours. Cardiac Enzymes No results for input(s): CKTOTAL, CKMB, CKMBINDEX, TROPONINI in the last 72 hours.  BNP: BNP (last 3 results)  Recent Labs  02/08/16 1513 04/03/16 1252 06/01/16 2054  BNP 29.9 23.9 56.7    ProBNP (last 3 results) No results for input(s): PROBNP in the last 8760 hours.   D-Dimer No results for input(s): DDIMER in the last 72 hours. Hemoglobin A1C No results for input(s): HGBA1C in the last 72 hours. Fasting Lipid Panel No results for input(s): CHOL, HDL, LDLCALC, TRIG, CHOLHDL, LDLDIRECT in the last 72 hours. Thyroid Function Tests No results for input(s): TSH, T4TOTAL, T3FREE, THYROIDAB in the last 72 hours.  Invalid input(s): FREET3  Other results:     Imaging/Studies:  No results found.  Latest Echo  Latest Cath  Medications:     Scheduled Medications: . ammonium lactate   Topical BID  . antiseptic oral rinse  7 mL Mouth Rinse BID  . aspirin EC  81 mg Oral Daily  . atorvastatin  40 mg Oral q1800  . colchicine  0.6 mg Oral Daily  . enoxaparin (LOVENOX) injection  90 mg Subcutaneous Daily  . febuxostat  40 mg Oral Daily  . fenofibrate  54 mg Oral Daily  . furosemide  80 mg Intravenous Q12H  . insulin aspart  0-20 Units Subcutaneous TID WC  . insulin aspart  0-5 Units Subcutaneous QHS  . insulin aspart  15 Units Subcutaneous TID WC  . insulin glargine  60 Units Subcutaneous BID  . metolazone  5 mg Oral BID  . potassium chloride SA  20 mEq Oral Daily  . sodium chloride flush  3 mL Intravenous Q12H    Infusions:    PRN  Medications: acetaminophen **OR** acetaminophen, ondansetron **OR** ondansetron (ZOFRAN) IV, oxyCODONE-acetaminophen   Assessment/Plan   He remains volume overloaded but continues to diurese on 80 mg IV lasix and metolazone 5 mg BID. Creatinine stable.   BPs better with addition of lisinopril 2.5 mg daily. Continue to follow renal function.   Appreciate WOC input for RLE.    To SNF on d/c. Pt refuses to even try to stand for weights unless multiple physical therapists present. Has been in and out of rehab several times with no improvement, or patient returns home and falls back into old habits. Discussed importance of working with rehab, limiting salt and fluid, and adhering to prescribed medical regimen.   Pt is at high risk for readmission with poor insight into his disease and dietary non-compliance.  Likely 1-2 days further IV diuresis then to SNF.   Length of Stay: 5   [name] PA-C 06/06/2016, 8:22 AM  Advanced Heart Failure Team Pager [phone] (M-F; 7a - 4p)  Please contact [facility] for night-coverage after hours (4p -7a ) and weekends on [email or web address]  Patient seen and examined with Otilio Saber, PA-C. We discussed all aspects of the encounter. I agree with the assessment and plan as stated above.   He continues to diurese but still volume overloaded. Renal function stable. Will continue IV diuresis.   He continues to not be an active participant in his care and seems to have developed quite a dependency on skilled care which seems out of proportion to his underlying disease state. He also remains noncompliant with dietary and medication recommendations.   Gurneet Matarese,MD 8:38 PM   .

## 2016-06-06 NOTE — Progress Notes (Signed)
Patient in chair and wanted to go back to bed.  Patient unable to stand up from chair with staff assistance x2.  Antonio [name] used to assist patient with standing and  three staff members with maximum assist during standing process.  Once patient standing, walker placed in front of patient and patient able to take a couple steps towards bed and then turn and sat on bed with contact guard assist from staff.

## 2016-06-07 LAB — BASIC METABOLIC PANEL
ANION GAP: 10 (ref 5–15)
BUN: 42 mg/dL — AB (ref 6–20)
CO2: 49 mmol/L — AB (ref 22–32)
Calcium: 9 mg/dL (ref 8.9–10.3)
Chloride: 82 mmol/L — ABNORMAL LOW (ref 101–111)
Creatinine, Ser: 1.05 mg/dL (ref 0.61–1.24)
GFR calc Af Amer: >60 mL/min (ref >60)
GFR calc non Af Amer: >60 mL/min (ref >60)
GLUCOSE: 40 mg/dL — AB (ref 65–99)
POTASSIUM: 4.1 mmol/L (ref 3.5–5.1)
Sodium: 141 mmol/L (ref 135–145)

## 2016-06-07 LAB — CBC
HEMATOCRIT: 40.2 % (ref 39.0–52.0)
HEMOGLOBIN: 11.6 g/dL — AB (ref 13.0–17.0)
MCH: 25.1 pg — AB (ref 26.0–34.0)
MCHC: 28.9 g/dL — AB (ref 30.0–36.0)
MCV: 86.8 fL (ref 78.0–100.0)
Platelets: 271 10*3/uL (ref 150–400)
RBC: 4.63 MIL/uL (ref 4.22–5.81)
RDW: 15.2 % (ref 11.5–15.5)
WBC: 11.1 10*3/uL — ABNORMAL HIGH (ref 4.0–10.5)

## 2016-06-07 LAB — GLUCOSE, CAPILLARY
GLUCOSE-CAPILLARY: 83 mg/dL (ref 65–99)
GLUCOSE-CAPILLARY: 80 mg/dL (ref 65–99)
GLUCOSE-CAPILLARY: 163 mg/dL — AB (ref 65–99)
GLUCOSE-CAPILLARY: 271 mg/dL — AB (ref 65–99)
Glucose-Capillary: 249 mg/dL — ABNORMAL HIGH (ref 65–99)

## 2016-06-07 MED ORDER — INSULIN ASPART 100 UNIT/ML ~~LOC~~ SOLN
5.0000 [IU] | Freq: Three times a day (TID) | SUBCUTANEOUS | Status: DC
  Administered 2016-06-07 – 2016-06-11 (×13): 5 [IU] via SUBCUTANEOUS

## 2016-06-07 MED ORDER — ACETAZOLAMIDE 250 MG PO TABS
500.0000 mg | ORAL_TABLET | Freq: Two times a day (BID) | ORAL | Status: AC
  Administered 2016-06-07 – 2016-06-09 (×6): 500 mg via ORAL
  Filled 2016-06-07 (×7): qty 2

## 2016-06-07 MED ORDER — INSULIN GLARGINE 100 UNIT/ML ~~LOC~~ SOLN
40.0000 [IU] | Freq: Two times a day (BID) | SUBCUTANEOUS | Status: DC
  Administered 2016-06-07 – 2016-06-11 (×8): 40 [IU] via SUBCUTANEOUS
  Filled 2016-06-07 (×12): qty 0.4

## 2016-06-07 NOTE — Clinical Social Work Note (Signed)
CSW met with patient. CSW confirmed patient  wanted placement with [facility]. Patient verified that he would like placement with [facility]. CSW explained she received a call from [facility] Admission Director Janie. She reported patient has a past due balance. She reported the patient would need to call facility and speak with the financial department before the  patient could be placed with facility. Patient reported he would call [facility] and follow up.

## 2016-06-07 NOTE — Progress Notes (Signed)
Physical Therapy Treatment Patient Details Name: Antonio [name] MRN: [id number] DOB: September 17, 1951 Today's Date: 06/07/2016    History of Present Illness 65 y.o. gentleman with a history of diastolic heart failure, HTN, HLD, DM, bioprosthetic aortic valve replacement, chronic respiratory failure with O2 dependence at baseline (4L [place]), and gout who presents to the ED complaining of progressive shortness of breath and DOE.  He reports a 16 lb weight gain in the past two weeks.     PT Comments    Pt is expressing reluctance to get up to walk today but agreed to move legs to ease his symptoms of L knee pain and skin issues.  He has drainage from a blister on R leg, did place his RLE on an absorbent surface to manage the moisture.  He is appropriate to continue acutely to increase his independence and speed the transition from SNF to home.  Follow Up Recommendations  SNF     Equipment Recommendations  None recommended by PT    Recommendations for Other Services       Precautions / Restrictions Precautions Precautions: Fall (telemetry, O2 sats run low with talking and any activity) Precaution Comments: oxygen dependent at baseline  Restrictions Weight Bearing Restrictions: No    Mobility  Bed Mobility Overal bed mobility: Needs Assistance Bed Mobility:  (scooting up in the bed)           General bed mobility comments: max of 2 for scooting up and   Transfers Overall transfer level: Needs assistance               General transfer comment: declined due to L knee complaints from sitting in a chair yesterday  Ambulation/Gait                 Stairs            Wheelchair Mobility    Modified Rankin (Stroke Patients Only)       Balance                                    Cognition Arousal/Alertness: Awake/alert Behavior During Therapy: WFL for tasks assessed/performed Overall Cognitive Status: Within Functional Limits for tasks  assessed                      Exercises General Exercises - Lower Extremity Ankle Circles/Pumps: AROM;Both;10 reps Quad Sets: AROM;Both;10 reps Gluteal Sets: AROM;Both;10 reps Heel Slides: AROM;Both;15 reps Hip ABduction/ADduction: AROM;Both;15 reps Straight Leg Raises: AROM;Both;10 reps Hip Flexion/Marching: AROM;Both;10 reps    General Comments General comments (skin integrity, edema, etc.): Pt is havign some drainage from a watery blister on the anterior R lower leg due to just having sheared skin on bed.Marland Kitchen  He is repositioned on the bed with absorbent pad to R leg and notified nursing.      Pertinent Vitals/Pain Pain Assessment: Faces Faces Pain Scale: Hurts little more Pain Location: lower legs and feet from skin, L knee from gout Pain Descriptors / Indicators: Burning;Sore Pain Intervention(s): Limited activity within patient's tolerance;Monitored during session;Repositioned    Home Living                      Prior Function            PT Goals (current goals can now be found in the care plan section) Acute Rehab PT Goals Patient Stated Goal:  to get to SNF Progress towards PT goals: Progressing toward goals    Frequency  Min 2X/week    PT Plan Current plan remains appropriate    Co-evaluation             End of Session Equipment Utilized During Treatment: Oxygen Activity Tolerance: Patient tolerated treatment well;Treatment limited secondary to medical complications (Comment) (drops O2 sats with effort, can decrease with talking as well) Patient left: in bed;with call bell/phone within reach;with bed alarm set     Time: 1121-1145 PT Time Calculation (min) (ACUTE ONLY): 24 min  Charges:  $Therapeutic Exercise: 8-22 mins $Therapeutic Activity: 8-22 mins                    G Codes:      Ivar Drape 07-02-2016, 12:29 PM    Samul Dada, PT MS Acute Rehab Dept. Number: [facility] [id number] and [facility] [phone]

## 2016-06-07 NOTE — Progress Notes (Signed)
Advanced Heart Failure Rounding Note  Referring Physician: Dr Blake Divine Primary Physician: Dr Leonette Most [facility]) Primary Cardiologist:  Dr. Shirlee Latch   Subjective:   Diuresed with IV lasix + metolazone. Negative 2.7 liters.   Refuses to get OOB due to knee pain. Denies SOB.  Echo 06/02/16 LVEF 55-60%, moderate LVH  Objective:   Weight Range: (!) 401 lb 4.8 oz (182 kg) Body mass index is 55.97 kg/m.   Vital Signs:   Temp:  [97.8 F (36.6 C)-98.4 F (36.9 C)] 98.4 F (36.9 C) (08/10 0854) Pulse Rate:  [88-99] 97 (08/10 0854) Resp:  [18-20] 20 (08/10 0325) BP: (124-130)/(59-77) 124/70 (08/10 0854) SpO2:  [90 %-96 %] 90 % (08/10 0854) Weight:  [401 lb 4.8 oz (182 kg)] 401 lb 4.8 oz (182 kg) (08/10 0325) Last BM Date: 06/05/16  Weight change: Filed Weights   06/04/16 0500 06/05/16 0524 06/07/16 0325  Weight: (!) 411 lb 4.8 oz (186.6 kg) (!) 400 lb 3.2 oz (181.5 kg) (!) 401 lb 4.8 oz (182 kg)    Intake/Output:   Intake/Output Summary (Last 24 hours) at 06/07/16 1109 Last data filed at 06/07/16 1100  Gross per 24 hour  Intake             1440 ml  Output             5900 ml  Net            -4460 ml     Physical Exam: General:  No resp difficulty. In bed.  HEENT: normal Neck: supple. JVP difficult to assess due to body habitus. Carotids 2+ bilat; no bruits. No thyromegaly or lymphadenopathy  Appreciated.  Cor: PMI nondisplaced. Distant heart sounds. RRR. No M/G/R Lungs: Diminished  Abdomen: obese, soft, NT, ND, no HSM. No bruits or masses. +BS  Extremities: no cyanosis, clubbing, rash,  Trace-1+ edema to knees. R leg with several bullae  Neuro: alert & orientedx3, cranial nerves grossly intact. moves all 4 extremities w/o difficulty. Affect pleasant  Telemetry: Normal Sinus 80-90s   Labs: CBC  Recent Labs  06/07/16 0330  WBC 11.1*  HGB 11.6*  HCT 40.2  MCV 86.8  PLT 271   Basic Metabolic Panel  Recent Labs  06/05/16 0810 06/06/16 0418 06/07/16 0330    NA 141 141 141  K 3.4* 3.9 4.1  CL 82* 80* 82*  CO2 >50* >50* 49*  GLUCOSE 129* 51* 40*  BUN 36* 42* 42*  CREATININE 1.02 1.28* 1.05  CALCIUM 9.3 9.3 9.0  MG 2.3  --   --    Liver Function Tests No results for input(s): AST, ALT, ALKPHOS, BILITOT, PROT, ALBUMIN in the last 72 hours. No results for input(s): LIPASE, AMYLASE in the last 72 hours. Cardiac Enzymes No results for input(s): CKTOTAL, CKMB, CKMBINDEX, TROPONINI in the last 72 hours.  BNP: BNP (last 3 results)  Recent Labs  02/08/16 1513 04/03/16 1252 06/01/16 2054  BNP 29.9 23.9 56.7    ProBNP (last 3 results) No results for input(s): PROBNP in the last 8760 hours.   D-Dimer No results for input(s): DDIMER in the last 72 hours. Hemoglobin A1C No results for input(s): HGBA1C in the last 72 hours. Fasting Lipid Panel No results for input(s): CHOL, HDL, LDLCALC, TRIG, CHOLHDL, LDLDIRECT in the last 72 hours. Thyroid Function Tests No results for input(s): TSH, T4TOTAL, T3FREE, THYROIDAB in the last 72 hours.  Invalid input(s): FREET3  Other results:     Imaging/Studies:  No results  found.  Latest Echo  Latest Cath   Medications:     Scheduled Medications: . ammonium lactate   Topical BID  . antiseptic oral rinse  7 mL Mouth Rinse BID  . aspirin EC  81 mg Oral Daily  . atorvastatin  40 mg Oral q1800  . colchicine  0.6 mg Oral Daily  . enoxaparin (LOVENOX) injection  90 mg Subcutaneous Daily  . febuxostat  40 mg Oral Daily  . fenofibrate  54 mg Oral Daily  . furosemide  80 mg Intravenous Q12H  . insulin aspart  0-20 Units Subcutaneous TID WC  . insulin aspart  0-5 Units Subcutaneous QHS  . insulin aspart  5 Units Subcutaneous TID WC  . insulin glargine  40 Units Subcutaneous BID  . metolazone  5 mg Oral BID  . potassium chloride SA  20 mEq Oral Daily  . sodium chloride flush  3 mL Intravenous Q12H    Infusions:    PRN Medications: acetaminophen **OR** acetaminophen, ondansetron  **OR** ondansetron (ZOFRAN) IV, oxyCODONE-acetaminophen   Assessment/Plan   1. Acute on Chronic Diastolic HF 2. AKI on CKD III  3. Bioprosthetic aortic valve 4. Morbid Obesity 5. OSA 6. DM2, poorly controlled  7. HTN 8. Acute Gout 9. Chronic respiratory failure on home O2 10. RBBB 11. Contraction alkalosis 12. Immobility   Volume status improving. Still 10-15 pounds to go. Continue IV lasix 80 twice a day. Add diamox 500 mg twice a day for 3 days. Renal function ok.   Appreciate WOC input for RLE.    PT following. He is refusing to get OOB. To SNF on d/c. Would benefit from Palliative Care consult for goals of care.    Length of Stay: 6  Amy Clegg NP-C  06/07/2016, 11:09 AM  Advanced Heart Failure Team Pager [phone] (M-F; 7a - 4p)  Please contact [facility] for night-coverage after hours (4p -7a ) and weekends on [email or web address]   .Patient seen and examined with Tonye Becket, NP. We discussed all aspects of the encounter. I agree with the assessment and plan as stated above.   He continues to diurese but still volume overloaded. Renal function stable. Will continue IV diuresis. Add diamox for alkalosis.   He continues to not be an active participant in his care and seems to have developed quite a dependency on skilled care which seems out of proportion to his underlying disease state. He also remains noncompliant with dietary and medication recommendations. I was very adamant that he get up and try to get out of bed today.   Sonal Dorwart,MD 11:30 PM

## 2016-06-07 NOTE — Progress Notes (Signed)
[id number] [place] [address] Over[address] [phone] [id number]09

## 2016-06-07 NOTE — Progress Notes (Signed)
Occupational Therapy Treatment Patient Details Name: Antonio [name] MRN: [id number] DOB: 02-15-1951 Today's Date: 06/07/2016    History of present illness 65 y.o. gentleman with a history of diastolic heart failure, HTN, HLD, DM, bioprosthetic aortic valve replacement, chronic respiratory failure with O2 dependence at baseline (4L [place]), and gout who presents to the ED complaining of progressive shortness of breath and DOE.  He reports a 16 lb weight gain in the past two weeks.    OT comments  Educated on energy conservation; pt verbalizes understanding.  Also educated on sitting EOB with staff to build endurance.  Will provide blue theraband for him to work on by himself:  He demonstrated his program without this.  Follow Up Recommendations  SNF;Supervision/Assistance - 24 hour    Equipment Recommendations  None recommended by OT (pt has a high commode and sponge bathes; will get toilet aid)    Recommendations for Other Services      Precautions / Restrictions Precautions Precautions: Fall;Other (comment) Precaution Comments: oxygen dependent at baseline        Mobility Bed Mobility                  Transfers                      Balance                                   ADL                                         General ADL Comments: brought photos of different toilet aides, as pt's does not work for him. He saw the tongs and plans to have his daughter get this.  Pt did not want to sit up at this time due to being cold.  He states that he had a lot of difficulty getting out of chair yesterday due to knee pain and it's being low.  Told him we have a lift pad that we can use with machine and also encouraged him to sit EOB when staff present.  He is familiar with band and I will drop off a piece for him to use on his own. He demonstrated exercise program he is doing.  Reviewed energy conservation and gave him a handout.       Vision                     Perception     Praxis      Cognition   Behavior During Therapy: [facility] for tasks assessed/performed;Impulsive Overall Cognitive Status: Within Functional Limits for tasks assessed                       Extremity/Trunk Assessment               Exercises     Shoulder Instructions       General Comments      Pertinent Vitals/ Pain       Pain Assessment: Faces Faces Pain Scale: Hurts a little bit Pain Location: L knee Pain Intervention(s): Limited activity within patient's tolerance;Monitored during session  Home Living  Prior Functioning/Environment              Frequency       Progress Toward Goals  OT Goals(current goals can now be found in the care plan section)  Progress towards OT goals: Progressing toward goals  Acute Rehab OT Goals Patient Stated Goal: to be more independent  Plan      Co-evaluation                 End of Session     Activity Tolerance Patient tolerated treatment well   Patient Left in bed;with bed alarm set   Nurse Communication          Time: [phone] OT Time Calculation (min): 14 min  Charges: OT General Charges $OT Visit: 1 Procedure OT Treatments $Therapeutic Activity: 8-22 mins  Antonio [name] 06/07/2016, 9:16 AM Marica Otter, OTR/L [phone] 06/07/2016

## 2016-06-07 NOTE — Progress Notes (Signed)
Spoke with Merdis Delay on call with [facility] pertaining to previous note.  K. Schorr stated to give 0600 dose of Lantus as ordered this morning.

## 2016-06-07 NOTE — Progress Notes (Signed)
[facility]                                                                              Patient Demographics  Antonio [name], is a 65 y.o. male, DOB - 26-Mar-1951, RKY:[id number]  Admit date - 06/01/2016   Admitting Physician Lily Kocher, MD  Outpatient Primary MD for the patient is [name], MD  Outpatient specialists:   LOS - 6  days    Chief Complaint  Patient presents with  . Shortness of Breath       Brief summary   Antonio [name] is a 65 y.o. gentleman with a history of diastolic heart failure, HTN, HLD, DM, bioprosthetic aortic valve replacement, chronic respiratory failure with O2 dependence at baseline (4L [place]), and gout who presents to the ED complaining of progressive shortness of breath and DOE.  He reported 16 lb weight gain in the past two weeks, increased abdominal girth and increased lower extremity edema.  No chest pain. He also complained of increasing left knee pain and swelling due to gout flare.   The patient required BiPAP upon arrival for acute hypoxia.  Chest xray showed cardiomegaly with interstitial pulmonary edema and small bilateral effusion. He significantly improved with BiPAP and aggressive IV diuresis. Cardiology/CHF team is following.   Assessment & Plan    Acute exacerbation of chronic diastolic heart failure - required BIPAP on admission - Currently on IV lasix 80 mg every 12 hours, negative 14 L. Weight 414 lbs -> 400(per patient his dry weight is 360-370's lbs), held low dose ACE due to mild bump in creatinine - Outpatient he is on torsemide 80 mg twice a day. - cardiology/CHF team is following closely - Wound care consult for the right lower extremity appreciated - very poor motivation overall  Acute gout flare in left knee-improving - Patient refused right knee x-ray, improved significantly after prednisone x3days, colchicine and uloric --elevated ESR, CRP, uric acid  DM uncontrolled, with hypoglycemia    --CBgs low again, will cut down lantus and meal coverage  Morbid obesity - Patient counseled on diet and weight control  Hypertension -BP currently stable   Chronic kidney disease stage 3 - Baseline creatinine 1.1-2, Cr 1.3  - held ACE, monitor  OSA on CPAP  Code Status:Full code DVT Prophylaxis:  Lovenox Family Communication: Discussed in detail with the patient, all imaging results, lab results explained to the patient  Disposition Plan: SNF in ?2days  Time Spent in minutes   25 minutes  Procedures:  CXR  Consultants:   cardiology  Antimicrobials:      Medications  Scheduled Meds: . acetaZOLAMIDE  500 mg Oral BID  . ammonium lactate   Topical BID  . antiseptic oral rinse  7 mL Mouth Rinse BID  . aspirin EC  81 mg Oral Daily  . atorvastatin  40 mg Oral q1800  . colchicine  0.6 mg Oral Daily  . enoxaparin (LOVENOX) injection  90 mg Subcutaneous Daily  . febuxostat  40 mg Oral Daily  . fenofibrate  54 mg Oral Daily  . furosemide  80 mg Intravenous Q12H  .  insulin aspart  0-20 Units Subcutaneous TID WC  . insulin aspart  0-5 Units Subcutaneous QHS  . insulin aspart  5 Units Subcutaneous TID WC  . insulin glargine  40 Units Subcutaneous BID  . metolazone  5 mg Oral BID  . potassium chloride SA  20 mEq Oral Daily  . sodium chloride flush  3 mL Intravenous Q12H   Continuous Infusions:  PRN Meds:.acetaminophen **OR** acetaminophen, ondansetron **OR** ondansetron (ZOFRAN) IV, oxyCODONE-acetaminophen   Antibiotics   Anti-infectives    None        Subjective:   Overall better now, O2 came off last night and became delirious  Objective:   Vitals:   06/06/16 1942 06/06/16 2349 06/07/16 0325 06/07/16 0854  BP: 126/77 (!) 128/59 130/69 124/70  Pulse: 94 89 88 97  Resp: '20 20 20   '$ Temp: 98.4 F (36.9 C) 98 F (36.7 C) 98.4 F (36.9 C) 98.4 F (36.9 C)  TempSrc: Oral Oral Oral Oral  SpO2: 95% 96% 93% 90%  Weight:   (!) 182 kg (401 lb 4.8 oz)    Height:        Intake/Output Summary (Last 24 hours) at 06/07/16 1211 Last data filed at 06/07/16 1100  Gross per 24 hour  Intake             1440 ml  Output             5900 ml  Net            -4460 ml     Wt Readings from Last 3 Encounters:  06/07/16 (!) 182 kg (401 lb 4.8 oz)  05/03/16 (!) 176 kg (388 lb)  04/03/16 (!) 174.4 kg (384 lb 8 oz)     Exam  General: Alert and oriented x 3, NAD  HEENT:    Neck: Supple + JVD  Cardiovascular: S1 S2 clear, RRR  Respiratory: Decreased breath sounds at the bases   Gastrointestinal: Morbidly obese Soft, NT, NBS, ND, + bowel sounds  Ext: no cyanosis clubbing, 2+ edema with right LE fluid blisters, able to flex left knee   Neuro: no new neuro deficits   Skin: No rashes  Psych: Normal affect and demeanor, alert and oriented x3    Data Reviewed:  I have personally reviewed following labs and imaging studies  Micro Results Recent Results (from the past 240 hour(s))  MRSA PCR Screening     Status: None   Collection Time: 06/02/16 12:02 AM  Result Value Ref Range Status   MRSA by PCR NEGATIVE NEGATIVE Final    Comment:        The GeneXpert MRSA Assay (FDA approved for NASAL specimens only), is one component of a comprehensive MRSA colonization surveillance program. It is not intended to diagnose MRSA infection nor to guide or monitor treatment for MRSA infections.     Radiology Reports Dg Chest Portable 1 View  Result Date: 06/01/2016 CLINICAL DATA:  Patient with shortness of breath. EXAM: PORTABLE CHEST 1 VIEW COMPARISON:  Chest radiograph 02/23/2016 FINDINGS: Low lung volumes. Marked cardiomegaly. Monitoring leads overlie the patient. Status post median sternotomy. Pulmonary vascular redistribution and new bilateral interstitial pulmonary opacities. Probable small bilateral pleural effusions. IMPRESSION: Cardiomegaly and interstitial pulmonary edema with small bilateral pleural effusions. Markedly low lung volumes.  Electronically Signed   By: Lovey Newcomer M.D.   On: 06/01/2016 21:25    Lab Data:  CBC:  Recent Labs Lab 06/01/16 2054 06/02/16 1059 06/07/16 0330  WBC 13.7*  11.9* 11.1*  NEUTROABS 10.0*  --   --   HGB 11.5* 10.9* 11.6*  HCT 40.0 37.4* 40.2  MCV 87.7 87.6 86.8  PLT 270 252 103   Basic Metabolic Panel:  Recent Labs Lab 06/03/16 0406 06/04/16 0516 06/05/16 0810 06/06/16 0418 06/07/16 0330  NA 138 141 141 141 141  K 4.8 4.4 3.4* 3.9 4.1  CL 87* 85* 82* 80* 82*  CO2 43* 49* >50* >50* 49*  GLUCOSE 437* 230* 129* 51* 40*  BUN 33* 37* 36* 42* 42*  CREATININE 1.36* 1.21 1.02 1.28* 1.05  CALCIUM 9.0 9.1 9.3 9.3 9.0  MG  --   --  2.3  --   --    GFR: Estimated Creatinine Clearance: 117.1 mL/min (by C-G formula based on SCr of 1.05 mg/dL). Liver Function Tests:  Recent Labs Lab 06/01/16 2054  AST 27  ALT 19  ALKPHOS 102  BILITOT 0.7  PROT 7.1  ALBUMIN 3.1*   No results for input(s): LIPASE, AMYLASE in the last 168 hours. No results for input(s): AMMONIA in the last 168 hours. Coagulation Profile: No results for input(s): INR, PROTIME in the last 168 hours. Cardiac Enzymes:  Recent Labs Lab 06/02/16 0019 06/02/16 1059 06/02/16 1415  TROPONINI 0.03* 0.06* 0.04*   BNP (last 3 results) No results for input(s): PROBNP in the last 8760 hours. HbA1C: No results for input(s): HGBA1C in the last 72 hours. CBG:  Recent Labs Lab 06/06/16 1612 06/06/16 2047 06/07/16 0440 06/07/16 0757 06/07/16 1109  GLUCAP 129* 141* 83 80 163*   Lipid Profile: No results for input(s): CHOL, HDL, LDLCALC, TRIG, CHOLHDL, LDLDIRECT in the last 72 hours. Thyroid Function Tests: No results for input(s): TSH, T4TOTAL, FREET4, T3FREE, THYROIDAB in the last 72 hours. Anemia Panel: No results for input(s): VITAMINB12, FOLATE, FERRITIN, TIBC, IRON, RETICCTPCT in the last 72 hours. Urine analysis:    Component Value Date/Time   COLORURINE YELLOW 02/23/2016 0525   APPEARANCEUR  TURBID (A) 02/23/2016 0525   LABSPEC 1.013 02/23/2016 0525   PHURINE 5.5 02/23/2016 0525   GLUCOSEU NEGATIVE 02/23/2016 0525   HGBUR SMALL (A) 02/23/2016 0525   HGBUR negative 06/12/2007 0843   BILIRUBINUR NEGATIVE 02/23/2016 0525   KETONESUR NEGATIVE 02/23/2016 0525   PROTEINUR 30 (A) 02/23/2016 0525   UROBILINOGEN 0.2 06/15/2015 0029   NITRITE NEGATIVE 02/23/2016 0525   LEUKOCYTESUR TRACE (A) 02/23/2016 1594     Cable,Dodie Parisi M.D. [facility] 06/07/2016, 12:11 PM  Pager: [phone] Between 7am to 7pm - call Pager - [phone]  After 7pm go to [email or web address] - password [email or web address]  Call night coverage person covering after 7pm

## 2016-06-08 LAB — CBC
HCT: 42.8 % (ref 39.0–52.0)
Hemoglobin: 12.3 g/dL — ABNORMAL LOW (ref 13.0–17.0)
MCH: 25.2 pg — ABNORMAL LOW (ref 26.0–34.0)
MCHC: 28.7 g/dL — ABNORMAL LOW (ref 30.0–36.0)
MCV: 87.7 fL (ref 78.0–100.0)
Platelets: 288 10*3/uL (ref 150–400)
RBC: 4.88 MIL/uL (ref 4.22–5.81)
RDW: 15.2 % (ref 11.5–15.5)
WBC: 12 10*3/uL — AB (ref 4.0–10.5)

## 2016-06-08 LAB — BASIC METABOLIC PANEL
Anion gap: 11 (ref 5–15)
BUN: 35 mg/dL — AB (ref 6–20)
CALCIUM: 9.1 mg/dL (ref 8.9–10.3)
CHLORIDE: 79 mmol/L — AB (ref 101–111)
CO2: 49 mmol/L — ABNORMAL HIGH (ref 22–32)
CREATININE: 1.07 mg/dL (ref 0.61–1.24)
Glucose, Bld: 123 mg/dL — ABNORMAL HIGH (ref 65–99)
Potassium: 3.1 mmol/L — ABNORMAL LOW (ref 3.5–5.1)
SODIUM: 139 mmol/L (ref 135–145)

## 2016-06-08 LAB — MAGNESIUM: MAGNESIUM: 2.4 mg/dL (ref 1.7–2.4)

## 2016-06-08 LAB — GLUCOSE, CAPILLARY
GLUCOSE-CAPILLARY: 355 mg/dL — AB (ref 65–99)
Glucose-Capillary: 131 mg/dL — ABNORMAL HIGH (ref 65–99)
Glucose-Capillary: 128 mg/dL — ABNORMAL HIGH (ref 65–99)
Glucose-Capillary: 297 mg/dL — ABNORMAL HIGH (ref 65–99)
Glucose-Capillary: 274 mg/dL — ABNORMAL HIGH (ref 65–99)

## 2016-06-08 MED ORDER — POTASSIUM CHLORIDE CRYS ER 20 MEQ PO TBCR
40.0000 meq | EXTENDED_RELEASE_TABLET | Freq: Two times a day (BID) | ORAL | Status: DC
  Administered 2016-06-08 – 2016-06-11 (×7): 40 meq via ORAL
  Filled 2016-06-08 (×7): qty 2

## 2016-06-08 MED ORDER — POTASSIUM CHLORIDE CRYS ER 20 MEQ PO TBCR
40.0000 meq | EXTENDED_RELEASE_TABLET | Freq: Once | ORAL | Status: AC
  Administered 2016-06-08: 40 meq via ORAL
  Filled 2016-06-08: qty 2

## 2016-06-08 NOTE — Progress Notes (Signed)
[facility]                                                                              Patient Demographics  Marshell Dilauro, is a 65 y.o. male, DOB - 1951-06-13, YQI:[id number]  Admit date - 06/01/2016   Admitting Physician Lily Kocher, MD  Outpatient Primary MD for the patient is [name], MD  Outpatient specialists:   LOS - 7  days    Chief Complaint  Patient presents with  . Shortness of Breath       Brief summary   Rigdon Macomber is a 65 y.o. gentleman with a history of diastolic heart failure, HTN, HLD, DM, bioprosthetic aortic valve replacement, chronic respiratory failure with O2 dependence at baseline (4L [place]), and gout who presents to the ED complaining of progressive shortness of breath and DOE.  He reported 16 lb weight gain in the past two weeks, increased abdominal girth and increased lower extremity edema.  No chest pain. He also complained of increasing left knee pain and swelling due to gout flare.   The patient required BiPAP upon arrival for acute hypoxia.  Chest xray showed cardiomegaly with interstitial pulmonary edema and small bilateral effusion. He significantly improved with BiPAP and aggressive IV diuresis. Cardiology/CHF team is following.   Assessment & Plan    Acute exacerbation of chronic diastolic heart failure - required BIPAP on admission - Currently on IV lasix 80 mg every 12 hours, metolazone and diamox, negative 20 L. Weight 385 lbs(per patient his dry weight is 360-370's lbs), held low dose ACE due to mild bump in creatinine - Outpatient he is on torsemide 80 mg twice a day. - cardiology/CHF team is following closely - Wound care consult for the right lower extremity appreciated - very poor motivation overall -plan for SNF in few days  Acute gout flare in left knee-improving - Patient refused right knee x-ray, improved significantly after prednisone x3days, colchicine and uloric --elevated ESR, CRP, uric  acid  DM uncontrolled, with hypoglycemia  --CBgs now better  Morbid obesity - Patient counseled on diet and weight control  Hypertension -BP currently stable   Chronic kidney disease stage 3 - Baseline creatinine 1.1-2, Cr 1.3  - held ACE, monitor  OSA on CPAP  Code Status:Full code DVT Prophylaxis:  Lovenox Family Communication: Discussed in detail with the patient, all imaging results, lab results explained to the patient  Disposition Plan: SNF in ?2days  Time Spent in minutes   25 minutes  Procedures:  CXR  Consultants:   cardiology  Antimicrobials:      Medications  Scheduled Meds: . acetaZOLAMIDE  500 mg Oral BID  . ammonium lactate   Topical BID  . antiseptic oral rinse  7 mL Mouth Rinse BID  . aspirin EC  81 mg Oral Daily  . atorvastatin  40 mg Oral q1800  . colchicine  0.6 mg Oral Daily  . enoxaparin (LOVENOX) injection  90 mg Subcutaneous Daily  . febuxostat  40 mg Oral Daily  . fenofibrate  54 mg Oral Daily  . furosemide  80 mg Intravenous Q12H  . insulin  aspart  0-20 Units Subcutaneous TID WC  . insulin aspart  0-5 Units Subcutaneous QHS  . insulin aspart  5 Units Subcutaneous TID WC  . insulin glargine  40 Units Subcutaneous BID  . metolazone  5 mg Oral BID  . potassium chloride SA  40 mEq Oral BID  . potassium chloride  40 mEq Oral Once  . sodium chloride flush  3 mL Intravenous Q12H   Continuous Infusions:  PRN Meds:.acetaminophen **OR** acetaminophen, ondansetron **OR** ondansetron (ZOFRAN) IV, oxyCODONE-acetaminophen   Antibiotics   Anti-infectives    None        Subjective:   Overall better now, O2 came off last night and became delirious  Objective:   Vitals:   06/07/16 2353 06/08/16 0402 06/08/16 0756 06/08/16 0851  BP: 138/84 136/77  (!) 146/78  Pulse: 86 90  92  Resp: 18 20    Temp: 98.1 F (36.7 C) 97.9 F (36.6 C)  98.1 F (36.7 C)  TempSrc: Oral Oral  Oral  SpO2: 94% 94%  92%  Weight:  (!) 177.6 kg (391 lb  9.6 oz) (!) 174.9 kg (385 lb 9.6 oz) (!) 174.3 kg (384 lb 3.2 oz)  Height:        Intake/Output Summary (Last 24 hours) at 06/08/16 1044 Last data filed at 06/08/16 0759  Gross per 24 hour  Intake              240 ml  Output             5975 ml  Net            -5735 ml     Wt Readings from Last 3 Encounters:  06/08/16 (!) 174.3 kg (384 lb 3.2 oz)  05/03/16 (!) 176 kg (388 lb)  04/03/16 (!) 174.4 kg (384 lb 8 oz)     Exam  General: Alert and oriented x 3, NAD  HEENT:    Neck: Supple + JVD  Cardiovascular: S1 S2 clear, RRR  Respiratory: Decreased breath sounds at the bases   Gastrointestinal: Morbidly obese Soft, NT, NBS, ND, + bowel sounds  Ext: no cyanosis clubbing, 1+ edema with right LE fluid blister   Neuro: no new neuro deficits   Skin: No rashes  Psych: Normal affect and demeanor, alert and oriented x3    Data Reviewed:  I have personally reviewed following labs and imaging studies  Micro Results Recent Results (from the past 240 hour(s))  MRSA PCR Screening     Status: None   Collection Time: 06/02/16 12:02 AM  Result Value Ref Range Status   MRSA by PCR NEGATIVE NEGATIVE Final    Comment:        The GeneXpert MRSA Assay (FDA approved for NASAL specimens only), is one component of a comprehensive MRSA colonization surveillance program. It is not intended to diagnose MRSA infection nor to guide or monitor treatment for MRSA infections.     Radiology Reports Dg Chest Portable 1 View  Result Date: 06/01/2016 CLINICAL DATA:  Patient with shortness of breath. EXAM: PORTABLE CHEST 1 VIEW COMPARISON:  Chest radiograph 02/23/2016 FINDINGS: Low lung volumes. Marked cardiomegaly. Monitoring leads overlie the patient. Status post median sternotomy. Pulmonary vascular redistribution and new bilateral interstitial pulmonary opacities. Probable small bilateral pleural effusions. IMPRESSION: Cardiomegaly and interstitial pulmonary edema with small bilateral  pleural effusions. Markedly low lung volumes. Electronically Signed   By: Annia Belt M.D.   On: 06/01/2016 21:25    Lab Data:  CBC:  Recent Labs Lab 06/01/16 2054 06/02/16 1059 06/07/16 0330 06/08/16 0514  WBC 13.7* 11.9* 11.1* 12.0*  NEUTROABS 10.0*  --   --   --   HGB 11.5* 10.9* 11.6* 12.3*  HCT 40.0 37.4* 40.2 42.8  MCV 87.7 87.6 86.8 87.7  PLT 270 252 271 048   Basic Metabolic Panel:  Recent Labs Lab 06/04/16 0516 06/05/16 0810 06/06/16 0418 06/07/16 0330 06/08/16 0514  NA 141 141 141 141 139  K 4.4 3.4* 3.9 4.1 3.1*  CL 85* 82* 80* 82* 79*  CO2 49* >50* >50* 49* 49*  GLUCOSE 230* 129* 51* 40* 123*  BUN 37* 36* 42* 42* 35*  CREATININE 1.21 1.02 1.28* 1.05 1.07  CALCIUM 9.1 9.3 9.3 9.0 9.1  MG  --  2.3  --  2.4  --    GFR: Estimated Creatinine Clearance: 111.9 mL/min (by C-G formula based on SCr of 1.07 mg/dL). Liver Function Tests:  Recent Labs Lab 06/01/16 2054  AST 27  ALT 19  ALKPHOS 102  BILITOT 0.7  PROT 7.1  ALBUMIN 3.1*   No results for input(s): LIPASE, AMYLASE in the last 168 hours. No results for input(s): AMMONIA in the last 168 hours. Coagulation Profile: No results for input(s): INR, PROTIME in the last 168 hours. Cardiac Enzymes:  Recent Labs Lab 06/02/16 0019 06/02/16 1059 06/02/16 1415  TROPONINI 0.03* 0.06* 0.04*   BNP (last 3 results) No results for input(s): PROBNP in the last 8760 hours. HbA1C: No results for input(s): HGBA1C in the last 72 hours. CBG:  Recent Labs Lab 06/07/16 1109 06/07/16 1621 06/07/16 2010 06/08/16 0637 06/08/16 0735  GLUCAP 163* 249* 271* 131* 128*   Lipid Profile: No results for input(s): CHOL, HDL, LDLCALC, TRIG, CHOLHDL, LDLDIRECT in the last 72 hours. Thyroid Function Tests: No results for input(s): TSH, T4TOTAL, FREET4, T3FREE, THYROIDAB in the last 72 hours. Anemia Panel: No results for input(s): VITAMINB12, FOLATE, FERRITIN, TIBC, IRON, RETICCTPCT in the last 72 hours. Urine  analysis:    Component Value Date/Time   COLORURINE YELLOW 02/23/2016 0525   APPEARANCEUR TURBID (A) 02/23/2016 0525   LABSPEC 1.013 02/23/2016 0525   PHURINE 5.5 02/23/2016 0525   GLUCOSEU NEGATIVE 02/23/2016 0525   HGBUR SMALL (A) 02/23/2016 0525   HGBUR negative 06/12/2007 0843   BILIRUBINUR NEGATIVE 02/23/2016 0525   KETONESUR NEGATIVE 02/23/2016 0525   PROTEINUR 30 (A) 02/23/2016 0525   UROBILINOGEN 0.2 06/15/2015 0029   NITRITE NEGATIVE 02/23/2016 0525   LEUKOCYTESUR TRACE (A) 02/23/2016 8891     Earsel,Prabhnoor Ellenberger M.D. [facility] 06/08/2016, 10:44 AM  Pager: [phone] Between 7am to 7pm - call Pager - [phone]  After 7pm go to [email or web address] - password [email or web address]  Call night coverage person covering after 7pm

## 2016-06-08 NOTE — Care Management Important Message (Signed)
Important Message  Patient Details  Name: Antonio [name] MRN: [id number] Date of Birth: January 22, 1951   Medicare Important Message Given:  Yes    Kyla Balzarine 06/08/2016, 10:47 AM

## 2016-06-08 NOTE — Progress Notes (Signed)
Inpatient Diabetes Program Recommendations  AACE/ADA: New Consensus Statement on Inpatient Glycemic Control (2015)  Target Ranges:  Prepandial:   less than 140 mg/dL      Peak postprandial:   less than 180 mg/dL (1-2 hours)      Critically ill patients:  140 - 180 mg/dL   Results for Antonio [name], Antonio [name] (MRN [id number]) as of 06/08/2016 09:15  Ref. Range 06/07/2016 07:57 06/07/2016 11:09 06/07/2016 16:21 06/07/2016 20:10 06/08/2016 06:37 06/08/2016 07:35  Glucose-Capillary Latest Ref Range: 65 - 99 mg/dL 80 409 (H) 811 (H) 914 (H) 131 (H) 128 (H)   Review of Glycemic Control  Diabetes history: DM2 Outpatient Diabetes medications: Basaglar 50 units bid, Novolog 30-40 units tid based on a sliding scale Current orders for Inpatient glycemic control: Lantus 40 units bid, Novolog 5 units tid with meals, Novolog 0-20 units tid with meals, Novolog 0-5 units qhs   Inpatient Diabetes Program Recommendations: Insulin - Meal Coverage: Please consider increasing meal coverage to Novolog 10 units tid with meals.  Thanks, Orlando Penner, RN, MSN, CDE Diabetes Coordinator Inpatient Diabetes Program [phone] (Team Pager from 8am to 5pm) [phone] ([facility] office) [phone] [facility] office) [phone] [facility] office)

## 2016-06-08 NOTE — Progress Notes (Signed)
Advanced Heart Failure Rounding Note  Referring Physician: Dr Blake Divine Primary Physician: Dr Leonette Most [facility]) Primary Cardiologist:  Dr. Shirlee Latch   Subjective:    Still limited work with PT due to refusal of certain activities, including transfer to chair.   Peed "a ton" yesterday.  Has a balance at [facility] he is working on covering so he can d'c there.  States he will work with PT and at least try before refusing any particular action.   Out 6.5 L and down 10 lbs on IV lasix, metolazone, and addition of diamox.   Echo 06/02/16 LVEF 55-60%, moderate LVH  Objective:   Weight Range: (!) 391 lb 9.6 oz (177.6 kg) Body mass index is 54.62 kg/m.   Vital Signs:   Temp:  [97.9 F (36.6 C)-98.7 F (37.1 C)] 97.9 F (36.6 C) (08/11 0402) Pulse Rate:  [86-99] 90 (08/11 0402) Resp:  [18-20] 20 (08/11 0402) BP: (124-150)/(70-87) 136/77 (08/11 0402) SpO2:  [90 %-94 %] 94 % (08/11 0402) Weight:  [391 lb 9.6 oz (177.6 kg)] 391 lb 9.6 oz (177.6 kg) (08/11 0402) Last BM Date: 06/05/16  Weight change: Filed Weights   06/05/16 0524 06/07/16 0325 06/08/16 0402  Weight: (!) 400 lb 3.2 oz (181.5 kg) (!) 401 lb 4.8 oz (182 kg) (!) 391 lb 9.6 oz (177.6 kg)    Intake/Output:   Intake/Output Summary (Last 24 hours) at 06/08/16 0740 Last data filed at 06/08/16 0720  Gross per 24 hour  Intake              240 ml  Output             6825 ml  Net            -6585 ml     Physical Exam: General:  No resp difficulty. Lying in bed. HEENT: normal Neck: supple. JVP difficult to assess due to body habitus. Carotids 2+ bilat; no bruits. No thyromegaly or nodule noted.   Cor: PMI nondisplaced. Distant heart sounds. Regular rate and rhythm. No rubs/gallops murmurs appreciated.  Lungs: Mildly diminished basilar sounds.  Normal effort Abdomen: obese, soft, NT, ND, no HSM. No bruits or masses. +BS  Extremities: no cyanosis, clubbing, rash,  Trace-1+ edema to knees. R leg with bullae, several of  which have ruptured Neuro: alert & orientedx3, cranial nerves grossly intact. moves all 4 extremities w/o difficulty. Affect pleasant  Telemetry: Reviewed personally, NSR 80-90s    Labs: CBC  Recent Labs  06/07/16 0330 06/08/16 0514  WBC 11.1* 12.0*  HGB 11.6* 12.3*  HCT 40.2 42.8  MCV 86.8 87.7  PLT 271 288   Basic Metabolic Panel  Recent Labs  06/05/16 0810  06/07/16 0330 06/08/16 0514  NA 141  < > 141 139  K 3.4*  < > 4.1 3.1*  CL 82*  < > 82* 79*  CO2 >50*  < > 49* 49*  GLUCOSE 129*  < > 40* 123*  BUN 36*  < > 42* 35*  CREATININE 1.02  < > 1.05 1.07  CALCIUM 9.3  < > 9.0 9.1  MG 2.3  --   --   --   < > = values in this interval not displayed. Liver Function Tests No results for input(s): AST, ALT, ALKPHOS, BILITOT, PROT, ALBUMIN in the last 72 hours. No results for input(s): LIPASE, AMYLASE in the last 72 hours. Cardiac Enzymes No results for input(s): CKTOTAL, CKMB, CKMBINDEX, TROPONINI in the last 72 hours.  BNP:  BNP (last 3 results)  Recent Labs  02/08/16 1513 04/03/16 1252 06/01/16 2054  BNP 29.9 23.9 56.7    ProBNP (last 3 results) No results for input(s): PROBNP in the last 8760 hours.   D-Dimer No results for input(s): DDIMER in the last 72 hours. Hemoglobin A1C No results for input(s): HGBA1C in the last 72 hours. Fasting Lipid Panel No results for input(s): CHOL, HDL, LDLCALC, TRIG, CHOLHDL, LDLDIRECT in the last 72 hours. Thyroid Function Tests No results for input(s): TSH, T4TOTAL, T3FREE, THYROIDAB in the last 72 hours.  Invalid input(s): FREET3  Other results:     Imaging/Studies:  No results found.  Latest Echo  Latest Cath   Medications:     Scheduled Medications: . acetaZOLAMIDE  500 mg Oral BID  . ammonium lactate   Topical BID  . antiseptic oral rinse  7 mL Mouth Rinse BID  . aspirin EC  81 mg Oral Daily  . atorvastatin  40 mg Oral q1800  . colchicine  0.6 mg Oral Daily  . enoxaparin (LOVENOX) injection   90 mg Subcutaneous Daily  . febuxostat  40 mg Oral Daily  . fenofibrate  54 mg Oral Daily  . furosemide  80 mg Intravenous Q12H  . insulin aspart  0-20 Units Subcutaneous TID WC  . insulin aspart  0-5 Units Subcutaneous QHS  . insulin aspart  5 Units Subcutaneous TID WC  . insulin glargine  40 Units Subcutaneous BID  . metolazone  5 mg Oral BID  . potassium chloride SA  40 mEq Oral BID  . sodium chloride flush  3 mL Intravenous Q12H    Infusions:    PRN Medications: acetaminophen **OR** acetaminophen, ondansetron **OR** ondansetron (ZOFRAN) IV, oxyCODONE-acetaminophen   Assessment/Plan   1. Acute on Chronic Diastolic HF 2. AKI on CKD III  3. Bioprosthetic aortic valve 4. Morbid Obesity 5. OSA 6. DM2, poorly controlled  7. HTN 8. Acute Gout 9. Chronic respiratory failure on home O2 10. RBBB 11. Contraction alkalosis 12. Immobility  13. Hypokalemia.   Marked diuresis noted overnight with addition of diamox.   Continue IV lasix 80 twice a day with 5 mg of metolazone for at least 1-2 more days with stable renal function and excellent diuresis.      For oral diuretics would anticipate torsemide 80 mg BID with at least weekly metolazone.   Continue diamox 500 mg twice a day for today and tomorrow.   Renal function stable. K down to 3.1. Will increase supp and check Mg.   Appreciate WOC input for RLE.    PT following. He is still very limited in what he will allow them to do, activity wise.  Again encouraged to be an active participant in his care.   Plan SNF on d/c. Would benefit from Palliative Care consult for goals of care.    Length of Stay: 7  [name] PA-C  06/08/2016, 7:40 AM  Advanced Heart Failure Team Pager [phone] (M-F; 7a - 4p)  Please contact [facility] for night-coverage after hours (4p -7a ) and weekends on [email or web address]   Patient seen and examined with Otilio Saber, PA-C. We discussed all aspects of the encounter. I agree with the  assessment and plan as stated above.   Diuresis much improved with diamox. Weight way down. Renal function stable. Continue IV diuresis one more day. Supp K+. Hopefully d/c to SNF Sunday or Monday.  Rhylan Gross,MD 10:04 AM

## 2016-06-08 NOTE — Progress Notes (Deleted)
Pt came up from the ed. Admission history done to the best of patients ability using an interpreter. Patients medications sent down to pharmacy. Patient oriented to room. Will continue to monitor patient.

## 2016-06-08 NOTE — Care Management Important Message (Signed)
Important Message  Patient Details  Name: Antonio [name] MRN: [id number] Date of Birth: Dec 09, 1950   Medicare Important Message Given:  Yes    Armstead Heiland, Stephan Minister 06/08/2016, 1:30 PM

## 2016-06-09 DIAGNOSIS — I5023 Acute on chronic systolic (congestive) heart failure: Secondary | ICD-10-CM

## 2016-06-09 LAB — GLUCOSE, CAPILLARY
GLUCOSE-CAPILLARY: 164 mg/dL — AB (ref 65–99)
GLUCOSE-CAPILLARY: 235 mg/dL — AB (ref 65–99)
GLUCOSE-CAPILLARY: 277 mg/dL — AB (ref 65–99)
Glucose-Capillary: 195 mg/dL — ABNORMAL HIGH (ref 65–99)
Glucose-Capillary: 274 mg/dL — ABNORMAL HIGH (ref 65–99)

## 2016-06-09 LAB — BASIC METABOLIC PANEL
Anion gap: 10 (ref 5–15)
BUN: 48 mg/dL — ABNORMAL HIGH (ref 6–20)
CALCIUM: 8.9 mg/dL (ref 8.9–10.3)
CO2: 44 mmol/L — AB (ref 22–32)
CREATININE: 1.24 mg/dL (ref 0.61–1.24)
Chloride: 80 mmol/L — ABNORMAL LOW (ref 101–111)
GFR calc non Af Amer: 59 mL/min — ABNORMAL LOW (ref >60)
Glucose, Bld: 185 mg/dL — ABNORMAL HIGH (ref 65–99)
Potassium: 3.6 mmol/L (ref 3.5–5.1)
SODIUM: 134 mmol/L — AB (ref 135–145)

## 2016-06-09 MED ORDER — METOLAZONE 5 MG PO TABS: 2.5000 mg | ORAL_TABLET | Freq: Every day | ORAL | Status: DC

## 2016-06-09 MED ORDER — TORSEMIDE 20 MG PO TABS
80.0000 mg | ORAL_TABLET | Freq: Two times a day (BID) | ORAL | Status: DC
  Administered 2016-06-09: 80 mg via ORAL
  Filled 2016-06-09: qty 4

## 2016-06-09 MED ORDER — CARVEDILOL 3.125 MG PO TABS
3.1250 mg | ORAL_TABLET | Freq: Two times a day (BID) | ORAL | Status: DC
Start: 2016-06-09 — End: 2016-06-11
  Administered 2016-06-09 – 2016-06-11 (×6): 3.125 mg via ORAL
  Filled 2016-06-09 (×6): qty 1

## 2016-06-09 NOTE — Plan of Care (Signed)
Problem: Activity: Goal: Capacity to carry out activities will improve Outcome: Progressing Encouraged to increase activity to OOB. Attempting to wean O2 to baseline prior to admission 2 - 3 Liters via [place]. Weaned from 5 Liters to 4 Liters with Sats = 93%.

## 2016-06-09 NOTE — Progress Notes (Signed)
   Please see last Advanced Heart Failure note.  Spoke to the patient who feels well and looking toward SNF rehab.

## 2016-06-09 NOTE — Progress Notes (Signed)
[facility]                                                                              Patient Demographics  Antonio [name], is a 65 y.o. male, DOB - 1951-03-15, TKP:[id number]  Admit date - 06/01/2016   Admitting Physician Lily Kocher, MD  Outpatient Primary MD for the patient is [name], MD  Outpatient specialists:   LOS - 8  days    Chief Complaint  Patient presents with  . Shortness of Breath       Brief summary   Antonio [name] is a 65 y.o. gentleman with a history of diastolic heart failure, HTN, HLD, DM, bioprosthetic aortic valve replacement, chronic respiratory failure with O2 dependence at baseline (4L [place]), and gout who presents to the ED complaining of progressive shortness of breath and DOE.  He reported 16 lb weight gain in the past two weeks, increased abdominal girth and increased lower extremity edema.  No chest pain. He also complained of increasing left knee pain and swelling due to gout flare.   The patient required BiPAP upon arrival for acute hypoxia.  Chest xray showed cardiomegaly with interstitial pulmonary edema and small bilateral effusion. He significantly improved with BiPAP and aggressive IV diuresis. Cardiology/CHF team is following.   Assessment & Plan    Acute exacerbation of chronic diastolic heart failure - required BIPAP on admission - Currently on IV lasix 80 mg every 12 hours, metolazone and diamox, negative 20 L. Weight 385 lbs(per patient his dry weight is 360-370's lbs), held low dose ACE due to mild bump in creatinine - Outpatient he is on torsemide 80 mg twice a day. - cardiology/CHF team is following closely - Wound care consult for the right lower extremity appreciated - very poor motivation overall - change to Oral torsemide per Cards recs and change metolazone -add low dose coreg due to NSVT -hopefully SNF tomorrow or Monday  Acute gout flare in left knee-improving - Patient refused right knee  x-ray, improved significantly after prednisone x3days, colchicine and uloric --elevated ESR, CRP, uric acid  DM uncontrolled, with hypoglycemia  --CBgs now better  Morbid obesity - Patient counseled on diet and weight control  Hypertension -BP currently stable   Chronic kidney disease stage 3 - Baseline creatinine 1.1-2, Cr 1.3  - held ACE, monitor  OSA on CPAP  Code Status:Full code DVT Prophylaxis:  Lovenox Family Communication: Discussed in detail with the patient, all imaging results, lab results explained to the patient  Disposition Plan: SNF in 1-2days  Time Spent in minutes   25 minutes  Procedures:  CXR  Consultants:   cardiology  Antimicrobials:      Medications  Scheduled Meds: . acetaZOLAMIDE  500 mg Oral BID  . ammonium lactate   Topical BID  . antiseptic oral rinse  7 mL Mouth Rinse BID  . aspirin EC  81 mg Oral Daily  . atorvastatin  40 mg Oral q1800  . carvedilol  3.125 mg Oral BID WC  . colchicine  0.6 mg Oral Daily  . enoxaparin (LOVENOX) injection  90 mg Subcutaneous Daily  .  febuxostat  40 mg Oral Daily  . fenofibrate  54 mg Oral Daily  . furosemide  80 mg Intravenous Q12H  . insulin aspart  0-20 Units Subcutaneous TID WC  . insulin aspart  0-5 Units Subcutaneous QHS  . insulin aspart  5 Units Subcutaneous TID WC  . insulin glargine  40 Units Subcutaneous BID  . metolazone  5 mg Oral BID  . potassium chloride SA  40 mEq Oral BID  . sodium chloride flush  3 mL Intravenous Q12H   Continuous Infusions:  PRN Meds:.acetaminophen **OR** acetaminophen, ondansetron **OR** ondansetron (ZOFRAN) IV, oxyCODONE-acetaminophen   Antibiotics   Anti-infectives    None        Subjective:   Feels well, no new complaints  Objective:   Vitals:   06/08/16 2340 06/09/16 0356 06/09/16 0757 06/09/16 1145  BP: (!) 142/79 140/66 132/74 138/81  Pulse: 94 92 85 94  Resp: _0 Temp: 98.2 F (36.8 C) 98 F (36.7 C) 97.6 F (36.4 C) 98  F (36.7 C)  TempSrc: Oral Oral Oral Oral  SpO2: 94% 96% 97% 97%  Weight:  (!) 176.4 kg (388 lb 14.4 oz)    Height:        Intake/Output Summary (Last 24 hours) at 06/09/16 1212 Last data filed at 06/09/16 0843  Gross per 24 hour  Intake              600 ml  Output             1975 ml  Net            -1375 ml     Wt Readings from Last 3 Encounters:  06/09/16 (!) 176.4 kg (388 lb 14.4 oz)  05/03/16 (!) 176 kg (388 lb)  04/03/16 (!) 174.4 kg (384 lb 8 oz)     Exam  General: Alert and oriented x 3, NAD  HEENT:    Neck: Supple + JVD  Cardiovascular: S1 S2 clear, RRR  Respiratory: Decreased breath sounds at the bases   Gastrointestinal: Morbidly obese Soft, NT, NBS, ND, + bowel sounds  Ext: no cyanosis clubbing, 1+ edema with right LE fluid blister   Neuro: no new neuro deficits   Skin: No rashes  Psych: Normal affect and demeanor, alert and oriented x3    Data Reviewed:  I have personally reviewed following labs and imaging studies  Micro Results Recent Results (from the past 240 hour(s))  MRSA PCR Screening     Status: None   Collection Time: 06/02/16 12:02 AM  Result Value Ref Range Status   MRSA by PCR NEGATIVE NEGATIVE Final    Comment:        The GeneXpert MRSA Assay (FDA approved for NASAL specimens only), is one component of a comprehensive MRSA colonization surveillance program. It is not intended to diagnose MRSA infection nor to guide or monitor treatment for MRSA infections.     Radiology Reports Dg Chest Portable 1 View  Result Date: 06/01/2016 CLINICAL DATA:  Patient with shortness of breath. EXAM: PORTABLE CHEST 1 VIEW COMPARISON:  Chest radiograph 02/23/2016 FINDINGS: Low lung volumes. Marked cardiomegaly. Monitoring leads overlie the patient. Status post median sternotomy. Pulmonary vascular redistribution and new bilateral interstitial pulmonary opacities. Probable small bilateral pleural effusions. IMPRESSION: Cardiomegaly and  interstitial pulmonary edema with small bilateral pleural effusions. Markedly low lung volumes. Electronically Signed   By: Lovey Newcomer M.D.   On: 06/01/2016 21:25    Lab Data:  CBC:  Recent Labs Lab 06/07/16 0330 06/08/16 0514  WBC 11.1* 12.0*  HGB 11.6* 12.3*  HCT 40.2 42.8  MCV 86.8 87.7  PLT 271 638   Basic Metabolic Panel:  Recent Labs Lab 06/05/16 0810 06/06/16 0418 06/07/16 0330 06/08/16 0514 06/09/16 0902  NA 141 141 141 139 134*  K 3.4* 3.9 4.1 3.1* 3.6  CL 82* 80* 82* 79* 80*  CO2 >50* >50* 49* 49* 44*  GLUCOSE 129* 51* 40* 123* 185*  BUN 36* 42* 42* 35* 48*  CREATININE 1.02 1.28* 1.05 1.07 1.24  CALCIUM 9.3 9.3 9.0 9.1 8.9  MG 2.3  --  2.4  --   --    GFR: Estimated Creatinine Clearance: 97.2 mL/min (by C-G formula based on SCr of 1.24 mg/dL). Liver Function Tests: No results for input(s): AST, ALT, ALKPHOS, BILITOT, PROT, ALBUMIN in the last 168 hours. No results for input(s): LIPASE, AMYLASE in the last 168 hours. No results for input(s): AMMONIA in the last 168 hours. Coagulation Profile: No results for input(s): INR, PROTIME in the last 168 hours. Cardiac Enzymes:  Recent Labs Lab 06/02/16 1415  TROPONINI 0.04*   BNP (last 3 results) No results for input(s): PROBNP in the last 8760 hours. HbA1C: No results for input(s): HGBA1C in the last 72 hours. CBG:  Recent Labs Lab 06/08/16 1636 06/08/16 2048 06/09/16 0601 06/09/16 0755 06/09/16 1143  GLUCAP 274* 355* 164* 195* 235*   Lipid Profile: No results for input(s): CHOL, HDL, LDLCALC, TRIG, CHOLHDL, LDLDIRECT in the last 72 hours. Thyroid Function Tests: No results for input(s): TSH, T4TOTAL, FREET4, T3FREE, THYROIDAB in the last 72 hours. Anemia Panel: No results for input(s): VITAMINB12, FOLATE, FERRITIN, TIBC, IRON, RETICCTPCT in the last 72 hours. Urine analysis:    Component Value Date/Time   COLORURINE YELLOW 02/23/2016 0525   APPEARANCEUR TURBID (A) 02/23/2016 0525    LABSPEC 1.013 02/23/2016 0525   PHURINE 5.5 02/23/2016 0525   GLUCOSEU NEGATIVE 02/23/2016 0525   HGBUR SMALL (A) 02/23/2016 0525   HGBUR negative 06/12/2007 0843   BILIRUBINUR NEGATIVE 02/23/2016 0525   KETONESUR NEGATIVE 02/23/2016 0525   PROTEINUR 30 (A) 02/23/2016 0525   UROBILINOGEN 0.2 06/15/2015 0029   NITRITE NEGATIVE 02/23/2016 0525   LEUKOCYTESUR TRACE (A) 02/23/2016 4536     Taz,Zamantha Strebel M.D. [facility] 06/09/2016, 12:12 PM  Pager: [phone] Between 7am to 7pm - call Pager - [phone]  After 7pm go to [email or web address] - password [email or web address]  Call night coverage person covering after 7pm

## 2016-06-10 DIAGNOSIS — N183 Chronic kidney disease, stage 3 (moderate): Secondary | ICD-10-CM

## 2016-06-10 DIAGNOSIS — N179 Acute kidney failure, unspecified: Secondary | ICD-10-CM

## 2016-06-10 LAB — BASIC METABOLIC PANEL
ANION GAP: 10 (ref 5–15)
BUN: 53 mg/dL — ABNORMAL HIGH (ref 6–20)
CO2: 47 mmol/L — AB (ref 22–32)
Calcium: 9.1 mg/dL (ref 8.9–10.3)
Chloride: 80 mmol/L — ABNORMAL LOW (ref 101–111)
Creatinine, Ser: 1.35 mg/dL — ABNORMAL HIGH (ref 0.61–1.24)
GFR calc Af Amer: >60 mL/min (ref >60)
GFR calc non Af Amer: 54 mL/min — ABNORMAL LOW (ref >60)
GLUCOSE: 212 mg/dL — AB (ref 65–99)
Potassium: 3.4 mmol/L — ABNORMAL LOW (ref 3.5–5.1)
Sodium: 137 mmol/L (ref 135–145)

## 2016-06-10 LAB — GLUCOSE, CAPILLARY
GLUCOSE-CAPILLARY: 244 mg/dL — AB (ref 65–99)
Glucose-Capillary: 231 mg/dL — ABNORMAL HIGH (ref 65–99)
Glucose-Capillary: 322 mg/dL — ABNORMAL HIGH (ref 65–99)
Glucose-Capillary: 352 mg/dL — ABNORMAL HIGH (ref 65–99)

## 2016-06-10 MED ORDER — TORSEMIDE 20 MG PO TABS
80.0000 mg | ORAL_TABLET | Freq: Two times a day (BID) | ORAL | Status: DC
  Administered 2016-06-11 (×2): 80 mg via ORAL
  Filled 2016-06-10 (×2): qty 4

## 2016-06-10 MED ORDER — TORSEMIDE 20 MG PO TABS
80.0000 mg | ORAL_TABLET | Freq: Every day | ORAL | Status: AC
  Administered 2016-06-10: 80 mg via ORAL
  Filled 2016-06-10: qty 4

## 2016-06-10 NOTE — Progress Notes (Addendum)
       Patient Name: Antonio [name] Date of Encounter: 06/10/2016    SUBJECTIVE: Did not use CPAP last night  TELEMETRY:  Sinus rhythm with intermittent isolated or coupled PVCs. Vitals:   06/09/16 1947 06/09/16 2354 06/10/16 0358 06/10/16 0726  BP: (!) 142/72 137/71 130/76 138/74  Pulse: 93 90 87 88  Resp: 18 20 18 20   Temp: 97.6 F (36.4 C) 97.8 F (36.6 C) 97.6 F (36.4 C) 98.6 F (37 C)  TempSrc: Oral Oral Oral Oral  SpO2: 94% 92% 93% 94%  Weight:   (!) 380 lb 1.6 oz (172.4 kg)   Height:        Intake/Output Summary (Last 24 hours) at 06/10/16 0947 Last data filed at 06/10/16 0700  Gross per 24 hour  Intake              720 ml  Output             2400 ml  Net            -1680 ml   LABS: Basic Metabolic Panel:  Recent Labs  83/25/49 0902 06/10/16 0432  NA 134* 137  K 3.6 3.4*  CL 80* 80*  CO2 44* 47*  GLUCOSE 185* 212*  BUN 48* 53*  CREATININE 1.24 1.35*  CALCIUM 8.9 9.1   CBC:  Recent Labs  06/08/16 0514  WBC 12.0*  HGB 12.3*  HCT 42.8  MCV 87.7  PLT 288     Radiology/Studies:  No new data  Physical Exam: Blood pressure 138/74, pulse 88, temperature 98.6 F (37 C), temperature source Oral, resp. rate 20, height 5\' 11"  (1.803 m), weight (!) 380 lb 1.6 oz (172.4 kg), SpO2 94 %. Weight change: -5 lb 8 oz (-2.495 kg)  Wt Readings from Last 3 Encounters:  06/10/16 (!) 380 lb 1.6 oz (172.4 kg)  05/03/16 (!) 388 lb (176 kg)  04/03/16 (!) 384 lb 8 oz (174.4 kg)  Weight of 188 kg (414 pounds) on admission   Edema and lower extremities has resolved. Lying relatively flat without dyspnea.  ASSESSMENT:  1. Acute on chronic diastolic failure near baseline weight at this time. 2. Acute on chronic kidney injury  Plan:  Hold diuretics today. Resume oral torsemide tomorrow.  Willette Pa III 06/10/2016, 9:47 AM   Addendum: Torsemide was given before I could height the order to hold. We will see what kidney function is like  tomorrow before the a.m. dose is given.

## 2016-06-10 NOTE — Progress Notes (Signed)
[facility]                                                                              Patient Demographics  Antonio [name], is a 65 y.o. male, DOB - [birth year], NOI:[id number]  Admit date - 06/01/2016   Admitting Physician Lily Kocher, MD  Outpatient Primary MD for the patient is [name], MD  Outpatient specialists:   LOS - 9  days    Chief Complaint  Patient presents with  . Shortness of Breath       Brief summary   Antonio [name] is a 65 y.o. gentleman with a history of diastolic heart failure, HTN, HLD, DM, bioprosthetic aortic valve replacement, chronic respiratory failure with O2 dependence at baseline (4L [place]), and gout who presents to the ED complaining of progressive shortness of breath and DOE.  He reported 16 lb weight gain in the past two weeks, increased abdominal girth and increased lower extremity edema.  No chest pain. He also complained of increasing left knee pain and swelling due to gout flare.   The patient required BiPAP upon arrival for acute hypoxia.  Chest xray showed cardiomegaly with interstitial pulmonary edema and small bilateral effusion. He significantly improved with BiPAP and aggressive IV diuresis. Cardiology/CHF team is following.  Diuresed well, negative 25L, now craetinine climbing,   Assessment & Plan    Acute exacerbation of chronic diastolic heart failure - required BIPAP on admission - was on IV lasix 80 mg every 12 hours, metolazone and diamox, negative 25 L. Weight 380 lbs(per patient his dry weight is 360-370's lbs), held low dose ACE due to mild bump in creatinine - diuretics changed to torsemide yesterday '80mg'$  BID, due to bump in creatinine will hold this evening dose and monitor Bmet, hold metolazone too - cardiology/CHF team is following closely - Wound care consult for the right lower extremity appreciated - very poor motivation overall - added low dose coreg due to NSVT - SNF soon if creatinine  stabilizes  Acute gout flare in left knee-improving - Patient refused right knee x-ray, improved significantly after prednisone x3days, colchicine and uloric --elevated ESR, CRP, uric acid  DM uncontrolled, with hypoglycemia  --CBgs now better  Morbid obesity - Patient counseled on diet and weight control  Hypertension -BP currently stable   AKI on Chronic kidney disease stage 3 - Baseline creatinine 1.1-2, Cr 1.3  - held ACE, hold torsemide today -monitor  OSA on CPAP  Code Status:Full code DVT Prophylaxis:  Lovenox Family Communication: Discussed in detail with the patient, all imaging results, lab results explained to the patient  Disposition Plan: SNF ? tomorrow  Time Spent in minutes   25 minutes  Procedures:  CXR  Consultants:   cardiology  Antimicrobials:      Medications  Scheduled Meds: . ammonium lactate   Topical BID  . antiseptic oral rinse  7 mL Mouth Rinse BID  . aspirin EC  81 mg Oral Daily  . atorvastatin  40 mg Oral q1800  . carvedilol  3.125 mg Oral BID WC  . colchicine  0.6 mg Oral Daily  .  enoxaparin (LOVENOX) injection  90 mg Subcutaneous Daily  . febuxostat  40 mg Oral Daily  . fenofibrate  54 mg Oral Daily  . insulin aspart  0-20 Units Subcutaneous TID WC  . insulin aspart  0-5 Units Subcutaneous QHS  . insulin aspart  5 Units Subcutaneous TID WC  . insulin glargine  40 Units Subcutaneous BID  . potassium chloride SA  40 mEq Oral BID  . sodium chloride flush  3 mL Intravenous Q12H  . [START ON 06/11/2016] torsemide  80 mg Oral BID   Continuous Infusions:  PRN Meds:.acetaminophen **OR** acetaminophen, ondansetron **OR** ondansetron (ZOFRAN) IV, oxyCODONE-acetaminophen   Antibiotics   Anti-infectives    None        Subjective:   Feels well, no new complaints  Objective:   Vitals:   06/09/16 2354 06/10/16 0358 06/10/16 0726 06/10/16 1000  BP: 137/71 130/76 138/74   Pulse: 90 87 88 91  Resp: '20 18 20   '$ Temp: 97.8 F  (36.6 C) 97.6 F (36.4 C) 98.6 F (37 C)   TempSrc: Oral Oral Oral   SpO2: 92% 93% 94% 94%  Weight:  (!) 172.4 kg (380 lb 1.6 oz)    Height:        Intake/Output Summary (Last 24 hours) at 06/10/16 1107 Last data filed at 06/10/16 0900  Gross per 24 hour  Intake             1200 ml  Output             2400 ml  Net            -1200 ml     Wt Readings from Last 3 Encounters:  06/10/16 (!) 172.4 kg (380 lb 1.6 oz)  05/03/16 (!) 176 kg (388 lb)  04/03/16 (!) 174.4 kg (384 lb 8 oz)     Exam  General: Alert and oriented x 3, NAD  HEENT:  Neck obese  Neck: Supple + JVD  Cardiovascular: S1 S2 clear, RRR  Respiratory: Decreased breath sounds at the bases   Gastrointestinal: Morbidly obese Soft, NT, NBS, ND, + bowel sounds  Ext: no cyanosis clubbing, 1+ edema with right LE fluid blister, some dark areas on both feet  Neuro: no new neuro deficits   Skin: No rashes  Psych: Normal affect and demeanor, alert and oriented x3    Data Reviewed:  I have personally reviewed following labs and imaging studies  Micro Results Recent Results (from the past 240 hour(s))  MRSA PCR Screening     Status: None   Collection Time: 06/02/16 12:02 AM  Result Value Ref Range Status   MRSA by PCR NEGATIVE NEGATIVE Final    Comment:        The GeneXpert MRSA Assay (FDA approved for NASAL specimens only), is one component of a comprehensive MRSA colonization surveillance program. It is not intended to diagnose MRSA infection nor to guide or monitor treatment for MRSA infections.     Radiology Reports Dg Chest Portable 1 View  Result Date: 06/01/2016 CLINICAL DATA:  Patient with shortness of breath. EXAM: PORTABLE CHEST 1 VIEW COMPARISON:  Chest radiograph 02/23/2016 FINDINGS: Low lung volumes. Marked cardiomegaly. Monitoring leads overlie the patient. Status post median sternotomy. Pulmonary vascular redistribution and new bilateral interstitial pulmonary opacities. Probable small  bilateral pleural effusions. IMPRESSION: Cardiomegaly and interstitial pulmonary edema with small bilateral pleural effusions. Markedly low lung volumes. Electronically Signed   By: Lovey Newcomer M.D.   On: 06/01/2016 21:25  Lab Data:  CBC:  Recent Labs Lab 06/07/16 0330 06/08/16 0514  WBC 11.1* 12.0*  HGB 11.6* 12.3*  HCT 40.2 42.8  MCV 86.8 87.7  PLT 271 494   Basic Metabolic Panel:  Recent Labs Lab 06/05/16 0810 06/06/16 0418 06/07/16 0330 06/08/16 0514 06/09/16 0902 06/10/16 0432  NA 141 141 141 139 134* 137  K 3.4* 3.9 4.1 3.1* 3.6 3.4*  CL 82* 80* 82* 79* 80* 80*  CO2 >50* >50* 49* 49* 44* 47*  GLUCOSE 129* 51* 40* 123* 185* 212*  BUN 36* 42* 42* 35* 48* 53*  CREATININE 1.02 1.28* 1.05 1.07 1.24 1.35*  CALCIUM 9.3 9.3 9.0 9.1 8.9 9.1  MG 2.3  --  2.4  --   --   --    GFR: Estimated Creatinine Clearance: 88 mL/min (by C-G formula based on SCr of 1.35 mg/dL). Liver Function Tests: No results for input(s): AST, ALT, ALKPHOS, BILITOT, PROT, ALBUMIN in the last 168 hours. No results for input(s): LIPASE, AMYLASE in the last 168 hours. No results for input(s): AMMONIA in the last 168 hours. Coagulation Profile: No results for input(s): INR, PROTIME in the last 168 hours. Cardiac Enzymes: No results for input(s): CKTOTAL, CKMB, CKMBINDEX, TROPONINI in the last 168 hours. BNP (last 3 results) No results for input(s): PROBNP in the last 8760 hours. HbA1C: No results for input(s): HGBA1C in the last 72 hours. CBG:  Recent Labs Lab 06/09/16 0755 06/09/16 1143 06/09/16 1646 06/09/16 1952 06/10/16 0723  GLUCAP 195* 235* 277* 274* 244*   Lipid Profile: No results for input(s): CHOL, HDL, LDLCALC, TRIG, CHOLHDL, LDLDIRECT in the last 72 hours. Thyroid Function Tests: No results for input(s): TSH, T4TOTAL, FREET4, T3FREE, THYROIDAB in the last 72 hours. Anemia Panel: No results for input(s): VITAMINB12, FOLATE, FERRITIN, TIBC, IRON, RETICCTPCT in the last 72  hours. Urine analysis:    Component Value Date/Time   COLORURINE YELLOW 02/23/2016 0525   APPEARANCEUR TURBID (A) 02/23/2016 0525   LABSPEC 1.013 02/23/2016 0525   PHURINE 5.5 02/23/2016 0525   GLUCOSEU NEGATIVE 02/23/2016 0525   HGBUR SMALL (A) 02/23/2016 0525   HGBUR negative 06/12/2007 0843   BILIRUBINUR NEGATIVE 02/23/2016 0525   KETONESUR NEGATIVE 02/23/2016 0525   PROTEINUR 30 (A) 02/23/2016 0525   UROBILINOGEN 0.2 06/15/2015 0029   NITRITE NEGATIVE 02/23/2016 0525   LEUKOCYTESUR TRACE (A) 02/23/2016 4967     Laren,Dempsy Damiano M.D. [facility] 06/10/2016, 11:07 AM  Pager: [phone] Between 7am to 7pm - call Pager - [phone]  After 7pm go to [email or web address] - password [email or web address]  Call night coverage person covering after 7pm

## 2016-06-11 DIAGNOSIS — L8990 Pressure ulcer of unspecified site, unspecified stage: Secondary | ICD-10-CM | POA: Insufficient documentation

## 2016-06-11 LAB — GLUCOSE, CAPILLARY
GLUCOSE-CAPILLARY: 363 mg/dL — AB (ref 65–99)
GLUCOSE-CAPILLARY: 289 mg/dL — AB (ref 65–99)
GLUCOSE-CAPILLARY: 262 mg/dL — AB (ref 65–99)

## 2016-06-11 LAB — BASIC METABOLIC PANEL
ANION GAP: 11 (ref 5–15)
BUN: 54 mg/dL — ABNORMAL HIGH (ref 6–20)
CALCIUM: 9.2 mg/dL (ref 8.9–10.3)
CO2: 43 mmol/L — ABNORMAL HIGH (ref 22–32)
Chloride: 83 mmol/L — ABNORMAL LOW (ref 101–111)
Creatinine, Ser: 1.29 mg/dL — ABNORMAL HIGH (ref 0.61–1.24)
GFR, EST NON AFRICAN AMERICAN: 57 mL/min — AB (ref >60)
Glucose, Bld: 226 mg/dL — ABNORMAL HIGH (ref 65–99)
POTASSIUM: 4.1 mmol/L (ref 3.5–5.1)
SODIUM: 137 mmol/L (ref 135–145)

## 2016-06-11 MED ORDER — HYDROCODONE-ACETAMINOPHEN 7.5-325 MG PO TABS: 1.0000 | ORAL_TABLET | ORAL | 0 refills | Status: AC | PRN

## 2016-06-11 MED ORDER — METOLAZONE 2.5 MG PO TABS: 2.5000 mg | ORAL_TABLET | ORAL | Status: DC | PRN

## 2016-06-11 MED ORDER — NOVOLOG 100 UNIT/ML ~~LOC~~ SOLN: 8.0000 [IU] | Freq: Three times a day (TID) | SUBCUTANEOUS | 0 refills | Status: DC

## 2016-06-11 MED ORDER — CARVEDILOL 3.125 MG PO TABS: 3.1250 mg | ORAL_TABLET | Freq: Two times a day (BID) | ORAL | Status: DC

## 2016-06-11 NOTE — Progress Notes (Signed)
Advanced Heart Failure Rounding Note  Referring Physician: Dr Blake DivineAkula Primary Physician: Dr Leonette MostKalish [facility] Primary Cardiologist:  Dr. Shirlee LatchMcLean   Subjective:    Still limited work with PT due to refusal of certain activities, including transfer to chair.   Peed "a ton" yesterday.  Has a balance at [facility] he is working on covering so he can d'c there.  States he will work with PT and at least try before refusing any particular action.   Out 6.5 L and down 10 lbs on IV lasix, metolazone, and addition of diamox.   Echo 06/02/16 LVEF 55-60%, moderate LVH  Objective:   Weight Range: (!) 383 lb (173.7 kg) Body mass index is 53.42 kg/m.   Vital Signs:   Temp:  [97.4 F (36.3 C)-98.2 F (36.8 C)] 97.4 F (36.3 C) (08/14 0938) Pulse Rate:  [84-95] 88 (08/14 0938) Resp:  [16-20] 16 (08/14 0938) BP: (121-150)/(72-74) 150/74 (08/14 0938) SpO2:  [91 %-96 %] 94 % (08/14 0938) Weight:  [383 lb (173.7 kg)] 383 lb (173.7 kg) (08/14 0500) Last BM Date: 06/10/16  Weight change: Filed Weights   06/09/16 0356 06/10/16 0358 06/11/16 0500  Weight: (!) 388 lb 14.4 oz (176.4 kg) (!) 380 lb 1.6 oz (172.4 kg) (!) 383 lb (173.7 kg)    Intake/Output:   Intake/Output Summary (Last 24 hours) at 06/11/16 1037 Last data filed at 06/11/16 0831  Gross per 24 hour  Intake              480 ml  Output             1925 ml  Net            -1445 ml     Physical Exam: General:  No resp difficulty. Lying in bed. HEENT: normal Neck: supple. JVP difficult to assess due to body habitus. Carotids 2+ bilat; no bruits. No thyromegaly or nodule noted.   Cor: PMI nondisplaced. Distant heart sounds. RRR. No M/G/R Lungs: Mildly diminished bibasilarly.  Normal effort.  Abdomen: obese, soft, NT, ND, no HSM. No bruits or masses. +BS  Extremities: no cyanosis, clubbing, rash,  Chronic edematous changes to BLE. R>L. Trace - 1+ edema to knees. R leg with several sores. Neuro: alert & orientedx3, cranial  nerves grossly intact. moves all 4 extremities w/o difficulty. Affect pleasant  Telemetry: Reviewed personally, NSR 80-90s    Labs: CBC No results for input(s): WBC, NEUTROABS, HGB, HCT, MCV, PLT in the last 72 hours. Basic Metabolic Panel  Recent Labs  06/10/16 0432 06/11/16 0420  NA 137 137  K 3.4* 4.1  CL 80* 83*  CO2 47* 43*  GLUCOSE 212* 226*  BUN 53* 54*  CREATININE 1.35* 1.29*  CALCIUM 9.1 9.2   Liver Function Tests No results for input(s): AST, ALT, ALKPHOS, BILITOT, PROT, ALBUMIN in the last 72 hours. No results for input(s): LIPASE, AMYLASE in the last 72 hours. Cardiac Enzymes No results for input(s): CKTOTAL, CKMB, CKMBINDEX, TROPONINI in the last 72 hours.  BNP: BNP (last 3 results)  Recent Labs  02/08/16 1513 04/03/16 1252 06/01/16 2054  BNP 29.9 23.9 56.7    ProBNP (last 3 results) No results for input(s): PROBNP in the last 8760 hours.   D-Dimer No results for input(s): DDIMER in the last 72 hours. Hemoglobin A1C No results for input(s): HGBA1C in the last 72 hours. Fasting Lipid Panel No results for input(s): CHOL, HDL, LDLCALC, TRIG, CHOLHDL, LDLDIRECT in the last 72 hours. Thyroid  Function Tests No results for input(s): TSH, T4TOTAL, T3FREE, THYROIDAB in the last 72 hours.  Invalid input(s): FREET3  Other results:     Imaging/Studies:  No results found.  Latest Echo  Latest Cath   Medications:     Scheduled Medications: . antiseptic oral rinse  7 mL Mouth Rinse BID  . aspirin EC  81 mg Oral Daily  . atorvastatin  40 mg Oral q1800  . carvedilol  3.125 mg Oral BID WC  . colchicine  0.6 mg Oral Daily  . enoxaparin (LOVENOX) injection  90 mg Subcutaneous Daily  . febuxostat  40 mg Oral Daily  . fenofibrate  54 mg Oral Daily  . insulin aspart  0-20 Units Subcutaneous TID WC  . insulin aspart  0-5 Units Subcutaneous QHS  . insulin aspart  5 Units Subcutaneous TID WC  . insulin glargine  40 Units Subcutaneous BID  .  potassium chloride SA  40 mEq Oral BID  . sodium chloride flush  3 mL Intravenous Q12H  . torsemide  80 mg Oral BID    Infusions:    PRN Medications: acetaminophen **OR** acetaminophen, ondansetron **OR** ondansetron (ZOFRAN) IV, oxyCODONE-acetaminophen   Assessment/Plan   1. Acute on Chronic Diastolic HF 2. AKI on CKD III  3. Bioprosthetic aortic valve 4. Morbid Obesity 5. OSA 6. DM2, poorly controlled  7. HTN 8. Acute Gout 9. Chronic respiratory failure on home O2 10. RBBB 11. Contraction alkalosis 12. Immobility  13. Hypokalemia.   Volume status OK on exam.  Continue torsemide 80 mg BID.  Can use metolazone prn.   Given 3 days of diamox with improved diuresis.  Now off.   Creatinine and electrolytes stable. BUN up slightly.   Appreciate WOC input for RLE.    PT continues to follow.  No PT over weekend.  Has continued to refuse full recommended therapy.   Plan SNF on d/c. Would benefit from Palliative Care consult for goals of care.      He is stable for d/c from HF perspective.  HF meds for home Coreg 3.125 mg BID Torsemide 80 mg BID Potassium 40 meq BID Metolazone 2.5 mg prn for weight gain of 3 lbs overnight or 5 lbs within one week.  Should take extra 20 meq of potassium on these days.    Length of Stay: 10  [name] PA-C  06/11/2016, 10:37 AM  Advanced Heart Failure Team Pager [phone] (M-F; 7a - 4p)  Please contact [facility] for night-coverage after hours (4p -7a ) and weekends on [email or web address]  Patient seen with PA, agree with the above note.  Weight down about 27 lbs.  Doing well currently, volume much better.  Going to [facility] today.  Will need close followup.  Needs low sodium diet.  Needs to work with PT.   [name] 06/11/2016

## 2016-06-11 NOTE — Discharge Summary (Addendum)
Physician Discharge Summary  Antonio [name] PNT:[id number] DOB: November 13, 1950 DOA: 06/01/2016  PCP: [name], MD  Admit date: 06/01/2016 Discharge date: 06/11/2016  Time spent:45 minutes  Recommendations for Outpatient Follow-up:  1. Dr.Dalton Aundra Dubin on 8/21 at 10:20am, please check Bmet at FU 2. Titrate Insulin dose, encourage CPAP QHS  Discharge Diagnoses:  Principal Problem:   Acute on chronic diastolic CHF   Non compliance   Morbid Obesity   Chronic resp failure on 4L O2   OSA on CPAP   Diabetes Mellitus on Insulin   Gout   OBESITY, NOS   EDEMA-LEGS,DUE TO VENOUS OBSTRUCT.   S/P aortic valve replacement with bioprosthetic valve   Acute on chronic congestive heart failure (HCC)   Knee pain, acute   AKI on CKD2  Discharge Condition: stable  Diet recommendation: low sodium, diabetic  Filed Weights   06/09/16 0356 06/10/16 0358 06/11/16 0500  Weight: (!) 176.4 kg (388 lb 14.4 oz) (!) 172.4 kg (380 lb 1.6 oz) (!) 173.7 kg (383 lb)    History of present illness:  Antonio [name] Jris a 65 y.o.gentleman with a history of diastolic heart failure, HTN, HLD, DM, bioprosthetic aortic valve replacement, chronic respiratory failure with O2 dependence at baseline (4L [place]), and gout who presents to the ED complaining of progressive shortness of breath and DOE. He reported 16 lb weight gain in the past two weeks, increased abdominal girth and increased lower extremity edema  Hospital Course:   Acute exacerbation of chronic diastolic heart failure -required BIPAP on admission -treated with IV lasix 80 mg every 12 hours, metolazone and diamox, -diuresed aggressively and negative 26 L by the time of discharge - weight down from 414 on admission to 383 at discharge - followed closely by CHF team, ECHO 06/02/16 with EF of 55-60% and normal wall motion -discharged per CHF team recs on Torsemide 80 mg BID, KCL 71mQ BID, Metolazone 2.5 mg prn for weight gain of 3 lbs overnight or 5 lbs  within one week.  Should take extra 20 meq of potassium on these days.  -needs a lot of encouragement, very poor/almost no self motivation - added low dose coreg due to NSVT 2days ago, now stable for DC to SNF -FU with Dr.McLean on 8/21  Acute gout flare in left knee-improved - Patient refused right knee x-ray, improved significantly after prednisone x3days, colchicine and uloric --elevated ESR, CRP, uric acid  DM uncontrolled, with hypoglycemia and hyperglycemia -on Lantus, novolog meal coverage and SSI -needs further dose titration  Morbid obesity - Patient counseled on diet and weight control  Hypertension -BP currently stable   AKI on Chronic kidney disease stage 3 - Baseline creatinine 1.1-2, Cr 1.3  - stopped ACE, creatinine stable now at 1.2  OSA on CPAP -needs frequent reminder  Consultations: Cardiology CHF team  Discharge Exam: Vitals:   06/11/16 0600 06/11/16 0938  BP:  (!) 150/74  Pulse: 86 88  Resp:  16  Temp:  97.4 F (36.3 C)    General: AAOx3 Cardiovascular: S1S2/RRR Respiratory: CTAB  Discharge Instructions   Discharge Instructions    Diet - low sodium heart healthy    Complete by:  As directed   Diet Carb Modified    Complete by:  As directed   Increase activity slowly    Complete by:  As directed     Current Discharge Medication List    START taking these medications   Details  carvedilol (COREG) 3.125 MG tablet  Take 1 tablet (3.125 mg total) by mouth 2 (two) times daily with a meal.    metolazone (ZAROXOLYN) 2.5 MG tablet Take 1 tablet (2.5 mg total) by mouth as needed (for weight gain, 3lbs overnight and 5lbs in 1 week, take extra KCL 20MeQ with this).      CONTINUE these medications which have CHANGED   Details  HYDROcodone-acetaminophen (NORCO) 7.5-325 MG tablet Take 1 tablet by mouth every 4 (four) hours as needed for moderate pain. Qty: 10 tablet, Refills: 0    NOVOLOG 100 UNIT/ML injection Inject 8 Units into the skin 3  (three) times daily with meals. Sliding scale Refills: 0      CONTINUE these medications which have NOT CHANGED   Details  Ascorbic Acid (VITAMIN C) 100 MG tablet Take 100 mg by mouth daily.    aspirin 81 MG tablet Take 81 mg by mouth daily.    atorvastatin (LIPITOR) 40 MG tablet Take 40 mg by mouth daily at 6 PM.     BAYER MICROLET LANCETS lancets As directed    BD PEN NEEDLE NANO U/F 32G X 4 MM MISC As directed    Coenzyme Q10 (CO Q-10 PO) Take 2 capsules by mouth daily.    colchicine 0.6 MG tablet Take 1 tablet (0.6 mg total) by mouth daily. Qty: 30 tablet, Refills: 0    febuxostat (ULORIC) 40 MG tablet Take 40 mg by mouth daily.     fenofibrate 54 MG tablet Take 54 mg by mouth daily. Refills: 0    fluticasone (FLONASE) 50 MCG/ACT nasal spray Place 1 spray into both nostrils daily. Qty: 16 g, Refills: 2    glucose blood (BAYER CONTOUR NEXT TEST) test strip As directed    hydrocerin (EUCERIN) CREA Apply 1 application topically 2 (two) times daily. Qty: 113 g, Refills: 0    Insulin Glargine (BASAGLAR KWIKPEN) 100 UNIT/ML SOPN Inject 50 Units into the skin 2 (two) times daily.  Refills: 0    lidocaine (LINDAMANTLE) 3 % CREA cream Apply 1 application topically 3 (three) times daily as needed (rash).    nystatin (MYCOSTATIN/NYSTOP) powder As directed to affected areas    Omega-3 Fatty Acids (FISH OIL) 1200 MG CAPS Take 1,200 mg by mouth daily.    OXYGEN Inhale 4 L into the lungs at bedtime.    potassium chloride SA (K-DUR,KLOR-CON) 20 MEQ tablet Take 40 meq (2 tablets) in the morning and 20 meq (1 tablet) in the evening.  Take 1 extra tab when you take Metolazone    Saccharomyces boulardii (PROBIOTIC) 250 MG CAPS Take 250 mg by mouth 2 (two) times daily.    torsemide (DEMADEX) 20 MG tablet Take 4 tablets (80 mg total) by mouth 2 (two) times daily. Qty: 240 tablet, Refills: 6      STOP taking these medications     predniSONE (DELTASONE) 20 MG tablet        No  Known Allergies Follow-up Information    Loralie Champagne, MD Follow up on 06/18/2016.   Specialty:  Cardiology Why:  at 1020 for post hospital follow up. Please bring all of your medications to your visit. The code for parking is 0003. Contact information: [address] [place] [address] [phone]            The results of significant diagnostics from this hospitalization (including imaging, microbiology, ancillary and laboratory) are listed below for reference.    Significant Diagnostic Studies: Dg Chest Portable 1 View  Result  Date: 06/01/2016 CLINICAL DATA:  Patient with shortness of breath. EXAM: PORTABLE CHEST 1 VIEW COMPARISON:  Chest radiograph 02/23/2016 FINDINGS: Low lung volumes. Marked cardiomegaly. Monitoring leads overlie the patient. Status post median sternotomy. Pulmonary vascular redistribution and new bilateral interstitial pulmonary opacities. Probable small bilateral pleural effusions. IMPRESSION: Cardiomegaly and interstitial pulmonary edema with small bilateral pleural effusions. Markedly low lung volumes. Electronically Signed   By: Lovey Newcomer M.D.   On: 06/01/2016 21:25    Microbiology: Recent Results (from the past 240 hour(s))  MRSA PCR Screening     Status: None   Collection Time: 06/02/16 12:02 AM  Result Value Ref Range Status   MRSA by PCR NEGATIVE NEGATIVE Final    Comment:        The GeneXpert MRSA Assay (FDA approved for NASAL specimens only), is one component of a comprehensive MRSA colonization surveillance program. It is not intended to diagnose MRSA infection nor to guide or monitor treatment for MRSA infections.      Labs: Basic Metabolic Panel:  Recent Labs Lab 06/05/16 0810  06/07/16 0330 06/08/16 0514 06/09/16 0902 06/10/16 0432 06/11/16 0420  NA 141  < > 141 139 134* 137 137  K 3.4*  < > 4.1 3.1* 3.6 3.4* 4.1  CL 82*  < > 82* 79* 80* 80* 83*  CO2 >50*  < > 49* 49* 44* 47* 43*  GLUCOSE 129*  < >  40* 123* 185* 212* 226*  BUN 36*  < > 42* 35* 48* 53* 54*  CREATININE 1.02  < > 1.05 1.07 1.24 1.35* 1.29*  CALCIUM 9.3  < > 9.0 9.1 8.9 9.1 9.2  MG 2.3  --  2.4  --   --   --   --   < > = values in this interval not displayed. Liver Function Tests: No results for input(s): AST, ALT, ALKPHOS, BILITOT, PROT, ALBUMIN in the last 168 hours. No results for input(s): LIPASE, AMYLASE in the last 168 hours. No results for input(s): AMMONIA in the last 168 hours. CBC:  Recent Labs Lab 06/07/16 0330 06/08/16 0514  WBC 11.1* 12.0*  HGB 11.6* 12.3*  HCT 40.2 42.8  MCV 86.8 87.7  PLT 271 288   Cardiac Enzymes: No results for input(s): CKTOTAL, CKMB, CKMBINDEX, TROPONINI in the last 168 hours. BNP: BNP (last 3 results)  Recent Labs  02/08/16 1513 04/03/16 1252 06/01/16 2054  BNP 29.9 23.9 56.7    ProBNP (last 3 results) No results for input(s): PROBNP in the last 8760 hours.  CBG:  Recent Labs Lab 06/10/16 1154 06/10/16 1648 06/10/16 2118 06/11/16 0738 06/11/16 1117  GLUCAP 231* 322* 352* 363* 289*       SignedDomenic Polite MD.  [facility] 06/11/2016, 11:46 AM

## 2016-06-11 NOTE — Clinical Social Work Note (Signed)
Clinical Social Worker facilitated patient discharge including contacting patient family and facility to confirm patient discharge plans.  Clinical information faxed to facility and family agreeable with plan.  CSW arranged ambulance transport via [facility] to [facility] .  RN to call report prior to discharge.  Clinical Social Worker will sign off for now as social work intervention is no longer needed. Please consult us again if new need arises.  Jesse Shoaib Siefker, LCSW [phone] 

## 2016-06-11 NOTE — Progress Notes (Signed)
Report given to receiving nurse at  [facility].

## 2016-06-11 NOTE — Progress Notes (Signed)
Assisting patient to turn and provided linen change at 0620. At this time, patient discovered to have three new pressure ulcers and moisture associated skin injury on both buttocks.  Verified with Julien Girt, RN IV.

## 2016-06-18 ENCOUNTER — Encounter (HOSPITAL_COMMUNITY): Payer: Medicare Other

## 2016-06-19 ENCOUNTER — Ambulatory Visit (HOSPITAL_COMMUNITY)
Admit: 2016-06-19 | Discharge: 2016-06-19 | Disposition: A | Discharge status: Home / Self Care | Payer: No Typology Code available for payment source | Source: Ambulatory Visit | Attending: Cardiology | Admitting: Cardiology

## 2016-06-19 ENCOUNTER — Encounter: Payer: Self-pay | Admitting: Licensed Clinical Social Worker

## 2016-06-19 ENCOUNTER — Encounter (HOSPITAL_COMMUNITY): Payer: Self-pay

## 2016-06-19 VITALS — BP 140/78 | HR 74 | Wt 391.2 lb

## 2016-06-19 DIAGNOSIS — Z7982 Long term (current) use of aspirin: Secondary | ICD-10-CM | POA: Insufficient documentation

## 2016-06-19 DIAGNOSIS — Z79899 Other long term (current) drug therapy: Secondary | ICD-10-CM | POA: Insufficient documentation

## 2016-06-19 DIAGNOSIS — Z6841 Body Mass Index (BMI) 40.0 and over, adult: Secondary | ICD-10-CM | POA: Diagnosis not present

## 2016-06-19 DIAGNOSIS — Z954 Presence of other heart-valve replacement: Secondary | ICD-10-CM | POA: Diagnosis not present

## 2016-06-19 DIAGNOSIS — Z8249 Family history of ischemic heart disease and other diseases of the circulatory system: Secondary | ICD-10-CM | POA: Diagnosis not present

## 2016-06-19 DIAGNOSIS — Z9981 Dependence on supplemental oxygen: Secondary | ICD-10-CM | POA: Insufficient documentation

## 2016-06-19 DIAGNOSIS — J9610 Chronic respiratory failure, unspecified whether with hypoxia or hypercapnia: Secondary | ICD-10-CM | POA: Diagnosis not present

## 2016-06-19 DIAGNOSIS — M109 Gout, unspecified: Secondary | ICD-10-CM | POA: Insufficient documentation

## 2016-06-19 DIAGNOSIS — Z953 Presence of xenogenic heart valve: Secondary | ICD-10-CM

## 2016-06-19 DIAGNOSIS — I447 Left bundle-branch block, unspecified: Secondary | ICD-10-CM | POA: Insufficient documentation

## 2016-06-19 DIAGNOSIS — Z794 Long term (current) use of insulin: Secondary | ICD-10-CM | POA: Insufficient documentation

## 2016-06-19 DIAGNOSIS — S81801D Unspecified open wound, right lower leg, subsequent encounter: Secondary | ICD-10-CM | POA: Diagnosis not present

## 2016-06-19 DIAGNOSIS — N183 Chronic kidney disease, stage 3 unspecified: Secondary | ICD-10-CM

## 2016-06-19 DIAGNOSIS — E662 Morbid (severe) obesity with alveolar hypoventilation: Secondary | ICD-10-CM | POA: Insufficient documentation

## 2016-06-19 DIAGNOSIS — I10 Essential (primary) hypertension: Secondary | ICD-10-CM

## 2016-06-19 DIAGNOSIS — I5032 Chronic diastolic (congestive) heart failure: Secondary | ICD-10-CM | POA: Diagnosis present

## 2016-06-19 DIAGNOSIS — E039 Hypothyroidism, unspecified: Secondary | ICD-10-CM | POA: Diagnosis not present

## 2016-06-19 DIAGNOSIS — E1122 Type 2 diabetes mellitus with diabetic chronic kidney disease: Secondary | ICD-10-CM | POA: Insufficient documentation

## 2016-06-19 NOTE — Patient Instructions (Signed)
Take metolazone 1 tab with extra 20 meq tablet of potassium once today OR tomorrow (your preference). May repeat this once daily as needed for weight gain/swelling/fluid.  Follow up 4-6 weeks with Otilio Saber PA-C.  Do the following things EVERYDAY: 1) Weigh yourself in the morning before breakfast. Write it down and keep it in a log. 2) Take your medicines as prescribed 3) Eat low salt foods-Limit salt (sodium) to 2000 mg per day.  4) Stay as active as you can everyday 5) Limit all fluids for the day to less than 2 liters

## 2016-06-19 NOTE — Progress Notes (Signed)
Patient ID: Antonio [name], male   DOB: 1951/03/31, 65 y.o.   MRN: [id number]    Advanced Heart Failure Clinic Note   PCP: Dr Leonette Most [facility]) HF Cardiology: Dr. Shirlee Latch  65 yo with bioprosthetic AVR, chronic diastolic CHF, CKD and morbid obesity presents for CHF clinic followup.  Last echo in 10/16 showed normal bioprosthetic aortic valve and normal EF.  He was  admitted in 10/16 with volume overload and diuresed.  He was sent home on torsemide 80 qam/40 qpm.  He sees a nephrologist in [facility].   He was admitted again in 11/16 with acute on chronic diastolic CHF and inability to do ADLs at home.  He was diuresed and discharged to a rehab facility.    Admitted 01/01/16 - 01/09/2016 with hypercapneic respiratory failure 2/2 to CPAP non-compliance due to poor tolerance and ? HCAP. Treated with BiPAP and ABX. Volume thought to be stable. Creatinine better than baseline that admission and discharge weight 361 lbs.   Admitted 4/26 - 02/27/16 with dizziness and hypotension. Diuretics held but later restarted. Discharge weight was 371 pounds. AHC was to follow.   Admitted 8/4 - 06/11/16 with marked volume overload. Diuresed on IV lasix, metolazone, and diamox. Weight down from 414 lbs on admission to 383 lbs at discharge.  He returns today for post hospital follow up. Now at [facility]. States they have no low sodium option. Given Bologna and salami with processed cheese and a bag of potato chips the other night.  Reports weight as up 1 lb daily. Weight 389 this am. States his breathing has improved. Working with PT and LOVES it, as well as OT. Extremely dissatisfied with diet. Paramedicine following at a distance for now as he is in SNF.  States his sugars have been running high.   Labs (10/16): K 3.7, creatinine 1.76, hgb 11.7  Labs (11/16): K 3.6, creatinine 1.68 Labs (12/16): K 4.5, creatinine 2.28 Labs (5/17): K 4.1, creatinine 1.35 Labs (6/17): creatinine 2.14  PMH: 1. Severe aortic stenosis:  s/p bioprosthetic AVR in 1/13.  2. 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   - RHC (12/16): mean RA 13, PA 36/14 mean 25, mean PCWP 21, CI 2.21.  - Echo (3/17) with EF 60-65%, grade II diastolic dysfunction, bioprosthetic aortic valve functioning normally, D-shaped interventricular septum with moderately dilated RV with severe systolic dysfunction.  - V/Q scan (3/17) without evidence for acute or chronic PE.  3. 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  4. Type II diabetes 5. Gout  6. CKD 7. Subclinical hypothyroidism 8. LHC (1/12) with normal coronaries. 9. Chronic LBBB 10. Morbid obesity  Family History  Problem Relation Age of Onset  . Cancer Mother   . Heart attack Father    Social History   Social History  . Marital status: Divorced    Spouse name: N/A  . Number of children: N/A  . Years of education: N/A   Occupational History  . retired travelling Engineer, site    Social History Main Topics  . Smoking status: Never Smoker  . Smokeless tobacco: Never Used  . Alcohol use Yes     Comment: 66/22/2016 "once or twice/yr I'll have a cocktail"      rare  . Drug use: No  . Sexual activity: Not Currently   Other Topics Concern  . None   Social  History Narrative  . None   ROS: All systems reviewed and negative except as per HPI  Current Outpatient Prescriptions  Medication Sig Dispense Refill  . Ascorbic Acid (VITAMIN C) 100 MG tablet Take 100 mg by mouth daily.    Marland Kitchen. aspirin 81 MG tablet Take 81 mg by mouth daily.    Marland Kitchen. atorvastatin (LIPITOR) 40 MG tablet Take 40 mg by mouth daily at 6 PM.     . BAYER MICROLET LANCETS lancets As directed    . BD PEN NEEDLE NANO U/F 32G X 4 MM MISC As directed    . carvedilol (COREG) 3.125 MG tablet Take 1 tablet (3.125 mg total) by mouth 2 (two) times daily with a meal.    . Coenzyme Q10 (CO Q-10 PO) Take 2  capsules by mouth daily.    . colchicine 0.6 MG tablet Take 1 tablet (0.6 mg total) by mouth daily. 30 tablet 0  . febuxostat (ULORIC) 40 MG tablet Take 40 mg by mouth daily.     . fenofibrate 54 MG tablet Take 54 mg by mouth daily.  0  . fluticasone (FLONASE) 50 MCG/ACT nasal spray Place 1 spray into both nostrils daily. (Patient taking differently: Place 1 spray into both nostrils daily as needed for allergies. ) 16 g 2  . glucose blood (BAYER CONTOUR NEXT TEST) test strip As directed    . hydrocerin (EUCERIN) CREA Apply 1 application topically 2 (two) times daily. 113 g 0  . HYDROcodone-acetaminophen (NORCO) 7.5-325 MG tablet Take 1 tablet by mouth every 4 (four) hours as needed for moderate pain. 10 tablet 0  . Insulin Glargine (BASAGLAR KWIKPEN) 100 UNIT/ML SOPN Inject 50 Units into the skin 2 (two) times daily.   0  . lidocaine (LINDAMANTLE) 3 % CREA cream Apply 1 application topically 3 (three) times daily as needed (rash).    . metolazone (ZAROXOLYN) 2.5 MG tablet Take 1 tablet (2.5 mg total) by mouth as needed (for weight gain, 3lbs overnight and 5lbs in 1 week, take extra KCL 20MeQ with this).    . NOVOLOG 100 UNIT/ML injection Inject 8 Units into the skin 3 (three) times daily with meals. Sliding scale  0  . nystatin (MYCOSTATIN/NYSTOP) powder As directed to affected areas    . Omega-3 Fatty Acids (FISH OIL) 1200 MG CAPS Take 1,200 mg by mouth daily.    . OXYGEN Inhale 4 L into the lungs at bedtime.    . potassium chloride SA (K-DUR,KLOR-CON) 20 MEQ tablet Take 40 meq (2 tablets) in the morning and 20 meq (1 tablet) in the evening.  Take 1 extra tab when you take Metolazone    . Saccharomyces boulardii (PROBIOTIC) 250 MG CAPS Take 250 mg by mouth 2 (two) times daily.    Marland Kitchen. torsemide (DEMADEX) 20 MG tablet Take 4 tablets (80 mg total) by mouth 2 (two) times daily. 240 tablet 6   No current facility-administered medications for this encounter.    Vitals:   06/19/16 1142  BP: 140/78    Pulse: 74  SpO2: 90%  Weight: (!) 391 lb 3.2 oz (177.4 kg)   Wt Readings from Last 3 Encounters:  06/19/16 (!) 391 lb 3.2 oz (177.4 kg)  06/11/16 (!) 383 lb (173.7 kg)  05/03/16 (!) 388 lb (176 kg)   General: NAD in wheelchair.  Neck: Thick, JVP difficult to assess due to pt size. No thyromegaly or lymphadenopathy.   Lungs: Clear, normal effort on 5 L 02.   CV: Nondisplaced  PMI.  Heart regular S1/S2, no S3/S4, 1/6 SEM RUSB.  R and LLE trace edema and multiple wounds.  No carotid bruit appreciated. Normal pedal pulses.  Abdomen: Morbidly obese, soft, NT, ND, no HSM. No bruits or masses. +BS  Skin: Intact  Neurologic: Alert and oriented x 3.  Psych: Normal affect. Extremities: No clubbing or cyanosis. BLE trace edema.   RLE with significant venous stasis changes. Several areas of bullae with mild serous drainage.  HEENT: Normal.   Assessment/Plan: 1. 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   - NYHA III.  - Volume status mildly elevated. Continue torsemide  80 mg bid and give 2.5 mg metolazone with extra 20 meq of potassium tomorrow.  Repeat prn for weight gains of 3 lbs overnight or 5 lbs within one week.  - Will have [facility] check BMET next week and fax to [place].  - LOW SALT DIET recommended. (<2000 mg daily) - Reinforced daily weights, low salt food choices, and limiting fluid intake < 2 liters per day.  2. CKD Stage III - Stable on d/c.  Will have SNF check next week and fax results.  3. Bioprosthetic aortic valve: Stable on last echo.  4. Morbid Obesity: Discussed portion control. Needs to lose weight. He eventually plans to follow up with [facility] for weigh reduction.  5. OHS/OSA: Now on chronic 02. He needs repeat sleep study for CPAP titration.   6. Chronic respiratory failure - Continue home 02. 7. Leg wounds - Per SNF.  Follow up 4-6 weeks PA side. Metolazone  tomorrow as above and repeat as needed.  Low salt diet recommendation and all other instructions sent to [facility].   Mariam Dollar Shaelyn Decarli PA-C  06/19/2016

## 2016-06-20 NOTE — Progress Notes (Signed)
CSW met with patient in the clinic. Patient is on paramedicine although currently in SNF at [facility]. Patient reports he is struggling with the diet at the SNF and that he is frequently being served high sodium/high sugar meals. Patient states frustration with facility and that he has spoken with staff specifically about "the morning eggs". He states that they serve "liquid eggs" and "I need real ones". CSW discussed issue at length and followed up with SNF with assistance of Amy Clegg,NP. SNF states that patient is requesting/ordering these meals from the menu and at times ordering 2 plates. SNF reports continued plans to work on education and compliance with diet with patient during stay at facility. CSW appreciative of coordination of care with SNF. CSW will continue to follow for care coordination with paramedicine program post discharge from the SNF facility. Raquel Sarna, LCSW [phone]

## 2016-07-03 ENCOUNTER — Telehealth (HOSPITAL_COMMUNITY): Payer: Self-pay | Admitting: Vascular Surgery

## 2016-07-03 DIAGNOSIS — G4730 Sleep apnea, unspecified: Secondary | ICD-10-CM

## 2016-07-03 NOTE — Telephone Encounter (Signed)
Pt needs order for sleep study / old order in from June location [facility].Marland Kitchen Need one for [facility]

## 2016-07-04 NOTE — Telephone Encounter (Signed)
Order placed

## 2016-07-09 ENCOUNTER — Ambulatory Visit (HOSPITAL_BASED_OUTPATIENT_CLINIC_OR_DEPARTMENT_OTHER): Payer: Medicare Other | Attending: Cardiology | Admitting: Cardiology

## 2016-07-09 VITALS — Ht 71.0 in | Wt 387.0 lb

## 2016-07-09 DIAGNOSIS — R0683 Snoring: Secondary | ICD-10-CM | POA: Insufficient documentation

## 2016-07-09 DIAGNOSIS — G4736 Sleep related hypoventilation in conditions classified elsewhere: Secondary | ICD-10-CM | POA: Insufficient documentation

## 2016-07-09 DIAGNOSIS — G4733 Obstructive sleep apnea (adult) (pediatric): Secondary | ICD-10-CM | POA: Diagnosis present

## 2016-07-09 DIAGNOSIS — I493 Ventricular premature depolarization: Secondary | ICD-10-CM | POA: Insufficient documentation

## 2016-07-12 ENCOUNTER — Telehealth (HOSPITAL_COMMUNITY): Payer: Self-pay | Admitting: Vascular Surgery

## 2016-07-12 NOTE — Telephone Encounter (Signed)
Pt called he had sleep study 9/11, he wants to know when he would get results so he could get new CPAP machine

## 2016-07-13 NOTE — Telephone Encounter (Signed)
Pt aware we will notify him when results available

## 2016-07-19 ENCOUNTER — Ambulatory Visit (HOSPITAL_COMMUNITY)
Admission: RE | Admit: 2016-07-19 | Discharge: 2016-07-19 | Disposition: A | Discharge status: Home / Self Care | Payer: Medicare Other | Source: Ambulatory Visit | Attending: Cardiology | Admitting: Cardiology

## 2016-07-19 VITALS — BP 118/66 | HR 78 | Wt 392.0 lb

## 2016-07-19 DIAGNOSIS — M109 Gout, unspecified: Secondary | ICD-10-CM | POA: Diagnosis not present

## 2016-07-19 DIAGNOSIS — N183 Chronic kidney disease, stage 3 unspecified: Secondary | ICD-10-CM

## 2016-07-19 DIAGNOSIS — Z794 Long term (current) use of insulin: Secondary | ICD-10-CM | POA: Diagnosis not present

## 2016-07-19 DIAGNOSIS — E1122 Type 2 diabetes mellitus with diabetic chronic kidney disease: Secondary | ICD-10-CM | POA: Insufficient documentation

## 2016-07-19 DIAGNOSIS — E785 Hyperlipidemia, unspecified: Secondary | ICD-10-CM

## 2016-07-19 DIAGNOSIS — J9610 Chronic respiratory failure, unspecified whether with hypoxia or hypercapnia: Secondary | ICD-10-CM | POA: Diagnosis not present

## 2016-07-19 DIAGNOSIS — Z954 Presence of other heart-valve replacement: Secondary | ICD-10-CM

## 2016-07-19 DIAGNOSIS — I5032 Chronic diastolic (congestive) heart failure: Secondary | ICD-10-CM | POA: Diagnosis present

## 2016-07-19 DIAGNOSIS — Z953 Presence of xenogenic heart valve: Secondary | ICD-10-CM

## 2016-07-19 DIAGNOSIS — Z79899 Other long term (current) drug therapy: Secondary | ICD-10-CM | POA: Diagnosis not present

## 2016-07-19 DIAGNOSIS — E039 Hypothyroidism, unspecified: Secondary | ICD-10-CM | POA: Diagnosis not present

## 2016-07-19 DIAGNOSIS — Z9989 Dependence on other enabling machines and devices: Secondary | ICD-10-CM

## 2016-07-19 DIAGNOSIS — I10 Essential (primary) hypertension: Secondary | ICD-10-CM | POA: Diagnosis not present

## 2016-07-19 DIAGNOSIS — Z7982 Long term (current) use of aspirin: Secondary | ICD-10-CM | POA: Diagnosis not present

## 2016-07-19 DIAGNOSIS — G4733 Obstructive sleep apnea (adult) (pediatric): Secondary | ICD-10-CM

## 2016-07-19 DIAGNOSIS — S81801D Unspecified open wound, right lower leg, subsequent encounter: Secondary | ICD-10-CM

## 2016-07-19 LAB — BASIC METABOLIC PANEL
Anion gap: 13 (ref 5–15)
BUN: 25 mg/dL — ABNORMAL HIGH (ref 6–20)
CO2: 35 mmol/L — ABNORMAL HIGH (ref 22–32)
Calcium: 9.2 mg/dL (ref 8.9–10.3)
Chloride: 90 mmol/L — ABNORMAL LOW (ref 101–111)
Creatinine, Ser: 1.08 mg/dL (ref 0.61–1.24)
GFR calc Af Amer: >60 mL/min (ref >60)
GFR calc non Af Amer: >60 mL/min (ref >60)
Glucose, Bld: 279 mg/dL — ABNORMAL HIGH (ref 65–99)
Potassium: 3.8 mmol/L (ref 3.5–5.1)
Sodium: 138 mmol/L (ref 135–145)

## 2016-07-19 LAB — BRAIN NATRIURETIC PEPTIDE: B Natriuretic Peptide: 36 pg/mL (ref 0.0–100.0)

## 2016-07-19 NOTE — Progress Notes (Signed)
Advanced Heart Failure Medication Review by a Pharmacist  Does the patient  feel that his/her medications are working for him/her?  yes  Has the patient been experiencing any side effects to the medications prescribed?  no  Does the patient measure his/her own blood pressure or blood glucose at home?  yes   Does the patient have any problems obtaining medications due to transportation or finances?   no  Understanding of regimen: good Understanding of indications: good Potential of compliance: good Patient understands to avoid NSAIDs. Patient understands to avoid decongestants.  Issues to address at subsequent visits: None   Pharmacist comments:  Antonio [name] is a pleasant 65 yo M presenting from [facility] with a current MAR. All medications were reconciled and no significant discrepancies were identified.   Tyler Deis. Bonnye Fava, PharmD, BCPS, CPP Clinical Pharmacist Pager: [phone] Phone: [phone] 07/19/2016 12:17 PM      Time with patient: 2 minutes Preparation and documentation time: 10 minutes Total time: 12 minutes

## 2016-07-19 NOTE — Progress Notes (Signed)
Patient ID: Antonio [name], male   DOB: [birth year], 65 y.o.   MRN: [id number]    Advanced Heart Failure Clinic Note   PCP: Dr Leonette Most [facility]) HF Cardiology: Dr. Shirlee Latch  65 yo with bioprosthetic AVR, chronic diastolic CHF, CKD and morbid obesity presents for CHF clinic followup.  Last echo in 10/16 showed normal bioprosthetic aortic valve and normal EF.  He was  admitted in 10/16 with volume overload and diuresed.  He was sent home on torsemide 80 qam/40 qpm.  He sees a nephrologist in [facility].   He was admitted again in 11/16 with acute on chronic diastolic CHF and inability to do ADLs at home.  He was diuresed and discharged to a rehab facility.    Admitted 01/01/16 - 01/09/2016 with hypercapneic respiratory failure 2/2 to CPAP non-compliance due to poor tolerance and ? HCAP. Treated with BiPAP and ABX. Volume thought to be stable. Creatinine better than baseline that admission and discharge weight 361 lbs.   Admitted 4/26 - 02/27/16 with dizziness and hypotension. Diuretics held but later restarted. Discharge weight was 371 pounds. AHC was to follow.   Admitted 8/4 - 06/11/16 with marked volume overload. Diuresed on IV lasix, metolazone, and diamox. Weight down from 414 lbs on admission to 383 lbs at discharge.  He returns today for regular follow up. At last visit given metolazone. Weight stable from that visit. Still says he is getting "poor, salty diet" from [facility]. We discussed with them after last visit, and they stated he had been requesting these meals. Breathing has been good. Works with physical and occupational therapy every day.  Says his DOE has improved and continues to. Sleeps slightly propped up. Had recent Sleep Study.  Said he has been without CPAP for nearly a year now.   Labs (10/16): K 3.7, creatinine 1.76, hgb 11.7  Labs (11/16): K 3.6, creatinine 1.68 Labs (12/16): K 4.5, creatinine 2.28 Labs (5/17): K 4.1, creatinine 1.35 Labs (6/17): creatinine 2.14  PMH: 1.  Severe aortic stenosis: s/p bioprosthetic AVR in 1/13.  2. 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   - RHC (12/16): mean RA 13, PA 36/14 mean 25, mean PCWP 21, CI 2.21.  - Echo (3/17) with EF 60-65%, grade II diastolic dysfunction, bioprosthetic aortic valve functioning normally, D-shaped interventricular septum with moderately dilated RV with severe systolic dysfunction.  - V/Q scan (3/17) without evidence for acute or chronic PE.  3. 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  4. Type II diabetes 5. Gout  6. CKD 7. Subclinical hypothyroidism 8. LHC (1/12) with normal coronaries. 9. Chronic LBBB 10. Morbid obesity  Family History  Problem Relation Age of Onset  . Cancer Mother   . Heart attack Father    Social History   Social History  . Marital status: Divorced    Spouse name: N/A  . Number of children: N/A  . Years of education: N/A   Occupational History  . retired travelling Engineer, site    Social History Main Topics  . Smoking status: Never Smoker  . Smokeless tobacco: Never Used  . Alcohol use Yes     Comment: 02/18/2015 "once or twice/yr I'll have a cocktail"      rare  . Drug use: No  . Sexual activity: Not Currently   Other Topics Concern  . Not on file  Social History Narrative  . No narrative on file   ROS: All systems reviewed and negative except as per HPI  Current Outpatient Prescriptions  Medication Sig Dispense Refill  . aspirin 81 MG tablet Take 81 mg by mouth daily.    Marland Kitchen atorvastatin (LIPITOR) 40 MG tablet Take 40 mg by mouth daily at 6 PM.     . BAYER MICROLET LANCETS lancets As directed    . BD PEN NEEDLE NANO U/F 32G X 4 MM MISC As directed    . carvedilol (COREG) 3.125 MG tablet Take 1 tablet (3.125 mg total) by mouth 2 (two) times daily with a meal.    . Coenzyme Q10 (CO Q-10 PO) Take 2 capsules by mouth daily.    .  colchicine 0.6 MG tablet Take 1 tablet (0.6 mg total) by mouth daily. 30 tablet 0  . febuxostat (ULORIC) 40 MG tablet Take 40 mg by mouth daily.     . fenofibrate 54 MG tablet Take 54 mg by mouth daily.  0  . fluticasone (FLONASE) 50 MCG/ACT nasal spray Place 1 spray into both nostrils daily. 16 g 2  . glucose blood (BAYER CONTOUR NEXT TEST) test strip As directed    . hydrocerin (EUCERIN) CREA Apply 1 application topically 2 (two) times daily. 113 g 0  . HYDROcodone-acetaminophen (NORCO) 7.5-325 MG tablet Take 1 tablet by mouth every 4 (four) hours as needed for moderate pain. 10 tablet 0  . insulin glargine (LANTUS) 100 UNIT/ML injection Inject 60 Units into the skin 2 (two) times daily.    Marland Kitchen lidocaine (LINDAMANTLE) 3 % CREA cream Apply 1 application topically 3 (three) times daily as needed (rash).    . metolazone (ZAROXOLYN) 2.5 MG tablet Take 1 tablet (2.5 mg total) by mouth as needed (for weight gain, 3lbs overnight and 5lbs in 1 week, take extra KCL with this).    . NOVOLOG 100 UNIT/ML injection Inject 8 Units into the skin 3 (three) times daily with meals. Sliding scale  0  . nystatin (MYCOSTATIN/NYSTOP) powder As directed to affected areas    . Omega-3 Fatty Acids (FISH OIL) 1200 MG CAPS Take 1,200 mg by mouth daily.    . OXYGEN Inhale 4 L into the lungs at bedtime.    . potassium chloride SA (K-DUR,KLOR-CON) 20 MEQ tablet Take 20 mEq by mouth 2 (two) times daily. Take an additional 20 meq (1 tablet) if take metolazone    . Saccharomyces boulardii (PROBIOTIC) 250 MG CAPS Take 250 mg by mouth 2 (two) times daily.    Marland Kitchen torsemide (DEMADEX) 20 MG tablet Take 4 tablets (80 mg total) by mouth 2 (two) times daily. 240 tablet 6  . vitamin C (ASCORBIC ACID) 500 MG tablet Take 500 mg by mouth daily.    . Vitamin D, Ergocalciferol, (DRISDOL) 50000 units CAPS capsule Take 50,000 Units by mouth 2 (two) times a week. Tuesday and Friday     No current facility-administered medications for this  encounter.    Vitals:   07/19/16 1159  BP: 118/66  Pulse: 78  SpO2: (!) 88%  Weight: (!) 392 lb (177.8 kg)   Wt Readings from Last 3 Encounters:  07/19/16 (!) 392 lb (177.8 kg)  07/09/16 (!) 387 lb (175.5 kg)  06/19/16 (!) 391 lb 3.2 oz (177.4 kg)   General: NAD in wheelchair.  Neck: Thick, JVP difficult to assess due to pt size. Does not appear elevated. No thyromegaly or  Nodule noted.  Lungs: CTAB, normal  effort. On 5 L via [place] CV: Nondisplaced PMI.  Heart regular S1/S2, no S3/S4, 1/6 SEM RUSB.  R and LLE trace edema and multiple wounds.  No carotid bruit appreciated. Normal pedal pulses.  Abdomen: Morbidly obese, soft, NT, ND, no HSM. No bruits or masses. +BS Skin: Intact  Neurologic: Alert and oriented x 3.  Psych: Normal affect. Extremities: No clubbing or cyanosis. BLE trace edema with bilateral venous stasis changes.   HEENT: Normal.   Assessment/Plan: 1. 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   - NYHA III.  - Volume status looks stable on exam. Continue torsemide 80 mg bid  - Continue 2.5 mg metolazone with extra 20 meq of potassium as needed for weight gains of 3 lbs overnight or 5 lbs within one week.   - Continue LOW SALT DIET . (<2000 mg daily) - Reinforced daily weights, low salt food choices, and limiting fluid intake < 2 liters per day.  2. CKD Stage III - BMET today. 3. Bioprosthetic aortic valve: Stable on last echo.  4. Morbid Obesity:  - Have again discussed portion control. Needs to lose weight.  - [facility]  5. OHS/OSA: Now on chronic 02. He needs repeat sleep study for CPAP titration.   - Had Sleep study. Will follow up to see how to best get him a CPAP machine.  6. Chronic respiratory failure - Continue home 02. 7. Leg wounds - Per SNF.  Follow up 2 months follow up.  BMET and BNP  today.    Graciella FreerMichael Andrew Tillery, PA-C  07/19/2016  Total time spent > 25 minutes over half that spent discussing the above.

## 2016-07-19 NOTE — Patient Instructions (Signed)
Routine lab work today. Will notify you of abnormal results, otherwise no news is good news!  Follow up 8 weeks.  Do the following things EVERYDAY: 1) Weigh yourself in the morning before breakfast. Write it down and keep it in a log. 2) Take your medicines as prescribed 3) Eat low salt foods-Limit salt (sodium) to 2000 mg per day.  4) Stay as active as you can everyday 5) Limit all fluids for the day to less than 2 liters

## 2016-07-30 ENCOUNTER — Encounter (HOSPITAL_BASED_OUTPATIENT_CLINIC_OR_DEPARTMENT_OTHER): Payer: Self-pay | Admitting: Cardiology

## 2016-07-30 ENCOUNTER — Telehealth (HOSPITAL_COMMUNITY): Payer: Self-pay | Admitting: *Deleted

## 2016-07-30 DIAGNOSIS — G4730 Sleep apnea, unspecified: Secondary | ICD-10-CM

## 2016-07-30 NOTE — Telephone Encounter (Signed)
Per Dr Shirlee Latch pt needs to e set up with Dr Mayford Knife to get CPAP for sleep apnea, pt aware and referral placed, pt also ask about last lab work provided results including creatinine level

## 2016-07-30 NOTE — Procedures (Signed)
   Patient Name: Antonio [name], Paparella Date: 07/09/2016 Gender: Male D.O.B: Dec 16, 1950 Age (years): 21 Referring Provider: [name] Height (inches): 71 Interpreting Physician: Armanda Magic MD, ABSM Weight (lbs): 387 RPSGT: Wylie Hail BMI: 54 MRN: [id number] Neck Size: 19.50  CLINICAL INFORMATION Sleep Study Type: NPSG Indication for sleep study: N/A Epworth Sleepiness Score: 11  SLEEP STUDY TECHNIQUE As per the AASM Manual for the Scoring of Sleep and Associated Events v2.3 (April 2016) with a hypopnea requiring 4% desaturations. The channels recorded and monitored were frontal, central and occipital EEG, electrooculogram (EOG), submentalis EMG (chin), nasal and oral airflow, thoracic and abdominal wall motion, anterior tibialis EMG, snore microphone, electrocardiogram, and pulse oximetry.  MEDICATIONS Patient's medications include: N/A. Medications self-administered by patient during sleep study : No sleep medicine administered.  SLEEP ARCHITECTURE The study was initiated at 11:01:47 PM and ended at 5:09:47 AM. Sleep onset time was 21.5 minutes and the sleep efficiency was 50.8%. The total sleep time was 187.0 minutes. Stage REM latency was 48.0 minutes. The patient spent 35.56% of the night in stage N1 sleep, 48.40% in stage N2 sleep, 0.00% in stage N3 and 16.04% in REM. Alpha intrusion was absent. Supine sleep was 100.00%.  RESPIRATORY PARAMETERS The overall apnea/hypopnea index (AHI) was 26.0 per hour. There were 2 total apneas, including 2 obstructive, 0 central and 0 mixed apneas. There were 79 hypopneas and 40 RERAs. The AHI during Stage REM sleep was 54.0 per hour. AHI while supine was 26.0 per hour. The mean oxygen saturation was 91.83%. The minimum SpO2 during sleep was 77.00%. Moderate snoring was noted during this study.  CARDIAC DATA The 2 lead EKG demonstrated sinus rhythm. The mean heart rate was 96.20 beats per minute. Other EKG findings include:  PVCs.  LEG MOVEMENT DATA The total PLMS were 0 with a resulting PLMS index of 0.00. Associated arousal with leg movement index was 0.0 .  IMPRESSIONS - Moderate obstructive sleep apnea occurred during this study (AHI = 26.0/h). - No significant central sleep apnea occurred during this study (CAI = 0.0/h). - Moderate oxygen desaturation was noted during this study (Min O2 = 77.00%). - The patient snored with Moderate snoring volume. - EKG findings include PVCs. - Clinically significant periodic limb movements did not occur during sleep. No significant associated arousals.  DIAGNOSIS - Obstructive Sleep Apnea (327.23 [G47.33 ICD-10]) - Nocturnal Hypoxemia (327.26 [G47.36 ICD-10]) - PVCs  RECOMMENDATIONS - Therapeutic CPAP titration to determine optimal pressure required to alleviate sleep disordered breathing. - Positional therapy avoiding supine position during sleep. - Avoid alcohol, sedatives and other CNS depressants that may worsen sleep apnea and disrupt normal sleep architecture. - Sleep hygiene should be reviewed to assess factors that may improve sleep quality. - Weight management and regular exercise should be initiated or continued if appropriate.  Armanda Magic Diplomate, American Board of Sleep Medicine  ELECTRONICALLY SIGNED ON:  07/30/2016, 12:50 PM [facility] PH: [phone]   FX: [phone] ACCREDITED BY THE AMERICAN ACADEMY OF SLEEP MEDICINE

## 2016-08-14 ENCOUNTER — Telehealth (HOSPITAL_COMMUNITY): Payer: Self-pay | Admitting: Surgery

## 2016-08-14 NOTE — Telephone Encounter (Signed)
Discussed patient with Air traffic controller.  He has been released from [facility] and he has returned home.  It may be helpful to expedite his appointment to [facility] in order to avoid readmission as he has a history of leaving rehab and quickly readmitting to hospital.  He does have a HHRN and HHPT per his report.

## 2016-08-14 NOTE — Telephone Encounter (Signed)
He may be. (for the 4 visits).   Can he be seen next week?  If he just got out this week can probably wait until then.    You can put "Consider for weekly visits x 4"   Thanks!    Mardelle Matte

## 2016-08-15 ENCOUNTER — Telehealth: Payer: Self-pay | Admitting: Physician Assistant

## 2016-08-15 ENCOUNTER — Ambulatory Visit: Payer: Medicare Other | Admitting: Cardiology

## 2016-08-15 ENCOUNTER — Telehealth (HOSPITAL_COMMUNITY): Payer: Self-pay | Admitting: Surgery

## 2016-08-15 MED ORDER — PREDNISONE 20 MG PO TABS: 20.0000 mg | ORAL_TABLET | Freq: Every day | ORAL | 0 refills | Status: DC

## 2016-08-15 NOTE — Telephone Encounter (Signed)
Patient has rescheduled for 12/13.

## 2016-08-15 NOTE — Telephone Encounter (Signed)
I returned call to [name] and explained that I sent in prescription for Prednisone 20 mg daily for 5 days per Dr. Shirlee Latch.  [name] tells me he has plenty of Colchicine and already takes that daily.  He now has an expedited appointment on Wed 25th at 10 am in the [facility].

## 2016-08-15 NOTE — Telephone Encounter (Signed)
I called to check on [name] because he has been released from [facility].  He is complaining about "Gout" and pain in his left knee.  And is requesting steroids to be called into his pharmacy.  He tells me that he is unable to get out of the chair.  I explained that I would speak to the physician and call him back.  He does say that he has a family member that will be there today at some point to assist him.

## 2016-08-15 NOTE — Telephone Encounter (Signed)
Paged by answering service. The patient states that this morning he woke up with severe L knee pain with swelling. This is his typical gout attack. He can not walk. Advised to take 0.6 mg table x 2 now and call Dr. Shirlee Latch office after 8am (who manages this per patient). He will need rx for steroids.   Will need to reschedule his appointment with Dr. Mayford Knife today.

## 2016-08-15 NOTE — Telephone Encounter (Signed)
May give prednisone 20 mg daily x 5 days then stop.  Take colchicine 0.6 daily until pain resolves.

## 2016-08-16 ENCOUNTER — Ambulatory Visit (HOSPITAL_BASED_OUTPATIENT_CLINIC_OR_DEPARTMENT_OTHER): Payer: Medicare Other

## 2016-08-21 ENCOUNTER — Encounter (HOSPITAL_COMMUNITY): Payer: Medicare Other

## 2016-08-22 ENCOUNTER — Inpatient Hospital Stay (HOSPITAL_COMMUNITY): Admission: RE | Admit: 2016-08-22 | Payer: Medicare Other | Source: Ambulatory Visit

## 2016-08-23 ENCOUNTER — Telehealth (HOSPITAL_COMMUNITY): Payer: Self-pay | Admitting: Surgery

## 2016-08-23 NOTE — Telephone Encounter (Signed)
I called to reschedule [name] [facility] appt after he was unable to find ride for yesterday's appt.  He is rescheduled for Nov 2 at 2:50pm.  He is aware and tells me that he will find transportation.

## 2016-08-30 ENCOUNTER — Inpatient Hospital Stay (HOSPITAL_COMMUNITY): Admission: RE | Admit: 2016-08-30 | Payer: Medicare Other | Source: Ambulatory Visit

## 2016-09-10 ENCOUNTER — Telehealth (HOSPITAL_COMMUNITY): Payer: Self-pay | Admitting: Surgery

## 2016-09-10 NOTE — Telephone Encounter (Signed)
I received phone communication from [facility] that [name] is not currently taking Coreg at home.  She also tells me that he has not left his home since discharge from SNF last month.  He mostly sits in his recliner chair (has urinals beside him).  Per Dr Shirlee Latch he is to restart his Coreg and needs to try to come to his appt this week at [facility].  [name] and I are working on plan to help him get to his appt.  I have asked [name] to be sure he resumes his Coreg (3.125- 2 times daily with meal)- previous dose at home.

## 2016-09-13 ENCOUNTER — Inpatient Hospital Stay (HOSPITAL_COMMUNITY): Admission: RE | Admit: 2016-09-13 | Payer: Medicare Other | Source: Ambulatory Visit

## 2016-09-13 ENCOUNTER — Emergency Department (HOSPITAL_COMMUNITY)
Admission: EM | Admit: 2016-09-13 | Discharge: 2016-09-14 | Disposition: A | Discharge status: Home / Self Care | Payer: Medicare Other | Attending: Emergency Medicine | Admitting: Emergency Medicine

## 2016-09-13 ENCOUNTER — Encounter (HOSPITAL_COMMUNITY): Payer: Self-pay | Admitting: Emergency Medicine

## 2016-09-13 DIAGNOSIS — E1165 Type 2 diabetes mellitus with hyperglycemia: Secondary | ICD-10-CM | POA: Insufficient documentation

## 2016-09-13 DIAGNOSIS — Z7982 Long term (current) use of aspirin: Secondary | ICD-10-CM | POA: Diagnosis not present

## 2016-09-13 DIAGNOSIS — N183 Chronic kidney disease, stage 3 (moderate): Secondary | ICD-10-CM | POA: Insufficient documentation

## 2016-09-13 DIAGNOSIS — I5032 Chronic diastolic (congestive) heart failure: Secondary | ICD-10-CM | POA: Diagnosis not present

## 2016-09-13 DIAGNOSIS — Z794 Long term (current) use of insulin: Secondary | ICD-10-CM | POA: Diagnosis not present

## 2016-09-13 DIAGNOSIS — Z79899 Other long term (current) drug therapy: Secondary | ICD-10-CM | POA: Diagnosis not present

## 2016-09-13 DIAGNOSIS — E1122 Type 2 diabetes mellitus with diabetic chronic kidney disease: Secondary | ICD-10-CM | POA: Diagnosis not present

## 2016-09-13 DIAGNOSIS — I130 Hypertensive heart and chronic kidney disease with heart failure and stage 1 through stage 4 chronic kidney disease, or unspecified chronic kidney disease: Secondary | ICD-10-CM | POA: Diagnosis not present

## 2016-09-13 DIAGNOSIS — R739 Hyperglycemia, unspecified: Secondary | ICD-10-CM

## 2016-09-13 LAB — I-STAT CHEM 8, ED
BUN: 34 mg/dL — ABNORMAL HIGH (ref 6–20)
CREATININE: 1.4 mg/dL — AB (ref 0.61–1.24)
Calcium, Ion: 1.08 mmol/L — ABNORMAL LOW (ref 1.15–1.40)
Chloride: 79 mmol/L — ABNORMAL LOW (ref 101–111)
GLUCOSE: 404 mg/dL — AB (ref 65–99)
HCT: 42 % (ref 39.0–52.0)
HEMOGLOBIN: 14.3 g/dL (ref 13.0–17.0)
Potassium: 3.6 mmol/L (ref 3.5–5.1)
Sodium: 136 mmol/L (ref 135–145)
TCO2: 50 mmol/L (ref 0–100)

## 2016-09-13 LAB — CBG MONITORING, ED
Glucose-Capillary: 390 mg/dL — ABNORMAL HIGH (ref 65–99)
Glucose-Capillary: 271 mg/dL — ABNORMAL HIGH (ref 65–99)

## 2016-09-13 MED ORDER — SODIUM CHLORIDE 0.9 % IV BOLUS (SEPSIS): 1000.0000 mL | Freq: Once | INTRAVENOUS | Status: DC

## 2016-09-13 MED ORDER — INSULIN ASPART 100 UNIT/ML ~~LOC~~ SOLN
10.0000 [IU] | Freq: Once | SUBCUTANEOUS | Status: AC
  Administered 2016-09-13: 10 [IU] via SUBCUTANEOUS
  Filled 2016-09-13: qty 1

## 2016-09-13 NOTE — ED Notes (Signed)
Pt requesting to know if he is going to be admitted.  Advised pt that the MD would determine if he needed to be admitted.  Pt st's he has to stay the night here due to not having a ride home.

## 2016-09-13 NOTE — ED Triage Notes (Signed)
Pt to ED with c/o hyperglycemia.  Pt st's he took his insulin but sugar did not come down.  EMS reports there CBG was 481.  Pt is on home 02 at 5 LPM

## 2016-09-13 NOTE — ED Provider Notes (Signed)
[facility]-EMER[facility]ENCY DEPT Provider Note   CSN: [id number] Arrival date & time: 09/13/16  1936     History   Chief Complaint Chief Complaint  Patient presents with  . Hyperglycemia    HPI Regina Blancett is a 65 y.o. male.  65 year old male presents with hyperglycemia from home. States that he had increased sugary foods today prior to arrival and his blood sugar at home which is 481. Self treated with extra insulin and blood sugar has improved. He denies any fever, vomiting. Some polyuria and polydipsia. Called EMS and was transported here.      Past Medical History:  Diagnosis Date  . Anxiety   . Aortic stenosis    a. s/p AVR 11/29/11  . Arthritis   . Blindness of right eye    a. due to amblyopia as child  . Chronic diastolic heart failure (HCC)    a. Echo 08/11/2015 LVEF 55-60% with normal RV function  . DM2 (diabetes mellitus, type 2) (HCC)   . Gout   . Hepatitis   . History of acute renal failure   . HLD (hyperlipidemia)   . HTN (hypertension)   . Hx MRSA infection    a. thigh abcess 2006  . OSA on CPAP    moderate with AHI 26/hr    Patient Active Problem List   Diagnosis Date Noted  . Pressure ulcer 06/11/2016  . CHF (congestive heart failure) (HCC) 06/01/2016  . CKD (chronic kidney disease), stage III 01/20/2016  . Obesity hypoventilation syndrome (HCC)   . Insulin dependent diabetes mellitus (HCC)   . OSA on CPAP   . Morbid obesity (HCC)   . Respiratory failure (HCC) 01/01/2016  . Shortness of breath   . Wound of right leg 11/03/2015  . Chronic diastolic CHF (congestive heart failure) (HCC) 09/08/2015  . S/P aortic valve replacement with bioprosthetic valve 02/22/2015  . PAF (paroxysmal atrial fibrillation) (HCC) 12/08/2011  . ABNORMAL CV (STRESS) TEST 11/15/2010  . Aortic stenosis 09/26/2010  . Hyperlipidemia 12/26/2006  . Gout 12/26/2006  . OBESITY, NOS 12/26/2006  . HYPERTENSION, BENIGN SYSTEMIC 12/26/2006  . EDEMA-LEGS,DUE TO VENOUS OBSTRUCT.  12/26/2006  . OSTEOARTHRITIS OF SPINE, NOS 12/26/2006    Past Surgical History:  Procedure Laterality Date  . AORTIC VALVE REPLACEMENT  11/29/2011   Procedure: AORTIC VALVE REPLACEMENT (AVR);  Surgeon: [name], MD;  Location: [facility] OR;  Service: Open Heart Surgery;  Laterality: N/A;  with nitric oxide  . BACK SURGERY    . CARDIAC CATHETERIZATION    . CARDIAC CATHETERIZATION N/A 10/14/2015   Procedure: Right Heart Cath;  Surgeon: [name], MD;  Location: [facility] INVASIVE CV LAB;  Service: Cardiovascular;  Laterality: N/A;  . CARDIAC VALVE REPLACEMENT    . INCISION AND DRAINAGE ABSCESS Left 08/2005   "upper/inner thigh; that's where the MRSA was"  . LUMBAR LAMINECTOMY  10/30/1983  . RIGHT HEART CATHETERIZATION N/A 11/28/2011   Procedure: RIGHT HEART CATH;  Surgeon: [name], MD;  Location: [facility] CATH LAB;  Service: Cardiovascular;  Laterality: N/A;       Home Medications    Prior to Admission medications   Medication Sig Start Date End Date Taking? Authorizing Provider  aspirin 81 MG tablet Take 81 mg by mouth daily.    Historical Provider, MD  atorvastatin (LIPITOR) 40 MG tablet Take 40 mg by mouth daily at 6 PM.  12/05/14   Historical Provider, MD  BAYER MICROLET LANCETS lancets As directed  Historical Provider, MD  BD PEN NEEDLE NANO U/F 32G X 4 MM MISC As directed 04/09/16   Historical Provider, MD  carvedilol (COREG) 3.125 MG tablet Take 1 tablet (3.125 mg total) by mouth 2 (two) times daily with a meal. 06/11/16   Zannie Cove, MD  Coenzyme Q10 (CO Q-10 PO) Take 2 capsules by mouth daily.    Historical Provider, MD  colchicine 0.6 MG tablet Take 1 tablet (0.6 mg total) by mouth daily. 06/01/16   Ellsworth Lennox, PA  febuxostat (ULORIC) 40 MG tablet Take 40 mg by mouth daily.     Historical Provider, MD  fenofibrate 54 MG tablet Take 54 mg by mouth daily. 05/04/15   Historical Provider, MD  fluticasone (FLONASE) 50 MCG/ACT nasal spray Place 1 spray into both  nostrils daily. 01/09/16   [name], MD  glucose blood (BAYER CONTOUR NEXT TEST) test strip As directed 05/29/16   Historical Provider, MD  hydrocerin (EUCERIN) CREA Apply 1 application topically 2 (two) times daily. 09/19/15   [name], PA-C  HYDROcodone-acetaminophen (NORCO) 7.5-325 MG tablet Take 1 tablet by mouth every 4 (four) hours as needed for moderate pain. 06/11/16   Zannie Cove, MD  insulin glargine (LANTUS) 100 UNIT/ML injection Inject 60 Units into the skin 2 (two) times daily.    Historical Provider, MD  lidocaine (LINDAMANTLE) 3 % CREA cream Apply 1 application topically 3 (three) times daily as needed (rash).    Historical Provider, MD  metolazone (ZAROXOLYN) 2.5 MG tablet Take 1 tablet (2.5 mg total) by mouth as needed (for weight gain, 3lbs overnight and 5lbs in 1 week, take extra KCL with this). 06/11/16   Zannie Cove, MD  NOVOLOG 100 UNIT/ML injection Inject 8 Units into the skin 3 (three) times daily with meals. Sliding scale 06/11/16   Zannie Cove, MD  nystatin (MYCOSTATIN/NYSTOP) powder As directed to affected areas 01/24/16   Historical Provider, MD  Omega-3 Fatty Acids (FISH OIL) 1200 MG CAPS Take 1,200 mg by mouth daily.    Historical Provider, MD  OXYGEN Inhale 4 L into the lungs at bedtime.    Historical Provider, MD  potassium chloride SA (K-DUR,KLOR-CON) 20 MEQ tablet Take 20 mEq by mouth 2 (two) times daily. Take an additional 20 meq (1 tablet) if take metolazone    Historical Provider, MD  predniSONE (DELTASONE) 20 MG tablet Take 1 tablet (20 mg total) by mouth daily with breakfast. Take 20 mg daily for 5 days 08/15/16   [name], MD  Saccharomyces boulardii (PROBIOTIC) 250 MG CAPS Take 250 mg by mouth 2 (two) times daily.    Historical Provider, MD  torsemide (DEMADEX) 20 MG tablet Take 4 tablets (80 mg total) by mouth 2 (two) times daily. 05/02/16   [name], MD  vitamin C (ASCORBIC ACID) 500 MG tablet Take 500 mg by mouth daily.     Historical Provider, MD  Vitamin D, Ergocalciferol, (DRISDOL) 50000 units CAPS capsule Take 50,000 Units by mouth 2 (two) times a week. Tuesday and Friday    Historical Provider, MD    Family History Family History  Problem Relation Age of Onset  . Cancer Mother   . Heart attack Father     Social History Social History  Substance Use Topics  . Smoking status: Never Smoker  . Smokeless tobacco: Never Used  . Alcohol use Yes     Comment: 02/18/2015 "once or twice/yr I'll have a cocktail"      rare  Allergies   Patient has no known allergies.   Review of Systems Review of Systems  All other systems reviewed and are negative.    Physical Exam Updated Vital Signs BP 140/70 (BP Location: Right Arm)   Pulse 100   Temp 98.4 F (36.9 C) (Oral)   Resp 22   Ht 5\' 11"  (1.803 m)   Wt (!) 225.4 kg   SpO2 95%   BMI 69.32 kg/m   Physical Exam  Constitutional: He is oriented to person, place, and time. He appears well-developed and well-nourished.  Non-toxic appearance. No distress.  HENT:  Head: Normocephalic and atraumatic.  Eyes: Conjunctivae, EOM and lids are normal. Pupils are equal, round, and reactive to light.  Neck: Normal range of motion. Neck supple. No tracheal deviation present. No thyroid mass present.  Cardiovascular: Normal rate, regular rhythm and normal heart sounds.  Exam reveals no gallop.   No murmur heard. Pulmonary/Chest: Effort normal and breath sounds normal. No stridor. No respiratory distress. He has no decreased breath sounds. He has no wheezes. He has no rhonchi. He has no rales.  Abdominal: Soft. Normal appearance and bowel sounds are normal. He exhibits no distension. There is no tenderness. There is no rebound and no CVA tenderness.  Musculoskeletal: Normal range of motion. He exhibits no edema or tenderness.  Lymphadenopathy:  Bilateral 3+ pitting lower extremity edema with chronic skin changes  Neurological: He is alert and oriented to  person, place, and time. He has normal strength. No cranial nerve deficit or sensory deficit. GCS eye subscore is 4. GCS verbal subscore is 5. GCS motor subscore is 6.  Skin: Skin is warm and dry. No abrasion and no rash noted.  Psychiatric: He has a normal mood and affect. His speech is normal and behavior is normal.  Nursing note and vitals reviewed.    ED Treatments / Results  Labs (all labs ordered are listed, but only abnormal results are displayed) Labs Reviewed  CBG MONITORING, ED - Abnormal; Notable for the following:       Result Value   Glucose-Capillary 390 (*)    All other components within normal limits  I-STAT CHEM 8, ED    EKG  EKG Interpretation None       Radiology No results found.  Procedures Procedures (including critical care time)  Medications Ordered in ED Medications  sodium chloride 0.9 % bolus 1,000 mL (not administered)     Initial Impression / Assessment and Plan / ED Course  I have reviewed the triage vital signs and the nursing notes.  Pertinent labs & imaging results that were available during my care of the patient were reviewed by me and considered in my medical decision making (see chart for details).  Clinical Course     Patient's hyperglycemia treated with subcutaneous insulin 10 units. Repeat blood sugars have improved. Patient does not have a way to enter his home this evening. Will be kept overnight and discharged in morning  Final Clinical Impressions(s) / ED Diagnoses   Final diagnoses:  None    New Prescriptions New Prescriptions   No medications on file     Lorre NickAnthony Leilanny Fluitt, MD 09/13/16 2243

## 2016-09-14 NOTE — ED Notes (Signed)
Attempting to discharge pt and pt has no way of getting into house until the morning. Pt to be moved to pod c and then his family will come and get him or have [facility] called for transport after speaking with family in morning. Pt does require oxygen at 5 liters to maintain oxygen saturation above 90 %,.

## 2016-09-18 ENCOUNTER — Other Ambulatory Visit (HOSPITAL_COMMUNITY): Payer: Self-pay | Admitting: Cardiology

## 2016-09-18 ENCOUNTER — Inpatient Hospital Stay (HOSPITAL_COMMUNITY): Admission: RE | Admit: 2016-09-18 | Payer: Medicare Other | Source: Ambulatory Visit

## 2016-09-18 ENCOUNTER — Telehealth (HOSPITAL_COMMUNITY): Payer: Self-pay | Admitting: Surgery

## 2016-09-18 NOTE — Telephone Encounter (Signed)
I called to check on [name] as he cancelled AHF appt today again due to lack of ability to easily to get in and out of his house.  He tells me that he is working on a hydraulic lift to help with getting down the stairs outside his house.  I emphasized the importance of getting to his appt so that providers can assess his fluid status.  He tells me that he cant see the weight on his scale --yet his feet "look good" and he feels "fine" with no SOB.  He has family coming in town today for the holiday.  I told him that he will have a new appt made after Thanksgiving and Florentina Addison will make him aware of date and time.  He is agreeable and try to make that appt.

## 2016-09-26 ENCOUNTER — Ambulatory Visit (HOSPITAL_COMMUNITY)
Admission: RE | Admit: 2016-09-26 | Discharge: 2016-09-26 | Disposition: A | Discharge status: Home / Self Care | Payer: Medicare Other | Source: Ambulatory Visit | Attending: Cardiology | Admitting: Cardiology

## 2016-09-26 VITALS — BP 130/74 | HR 103 | Wt 397.4 lb

## 2016-09-26 DIAGNOSIS — I10 Essential (primary) hypertension: Secondary | ICD-10-CM

## 2016-09-26 DIAGNOSIS — E785 Hyperlipidemia, unspecified: Secondary | ICD-10-CM | POA: Diagnosis not present

## 2016-09-26 DIAGNOSIS — I447 Left bundle-branch block, unspecified: Secondary | ICD-10-CM | POA: Diagnosis not present

## 2016-09-26 DIAGNOSIS — N183 Chronic kidney disease, stage 3 unspecified: Secondary | ICD-10-CM

## 2016-09-26 DIAGNOSIS — G4733 Obstructive sleep apnea (adult) (pediatric): Secondary | ICD-10-CM | POA: Insufficient documentation

## 2016-09-26 DIAGNOSIS — Z8249 Family history of ischemic heart disease and other diseases of the circulatory system: Secondary | ICD-10-CM | POA: Insufficient documentation

## 2016-09-26 DIAGNOSIS — Z794 Long term (current) use of insulin: Secondary | ICD-10-CM | POA: Diagnosis not present

## 2016-09-26 DIAGNOSIS — M10062 Idiopathic gout, left knee: Secondary | ICD-10-CM

## 2016-09-26 DIAGNOSIS — S81801D Unspecified open wound, right lower leg, subsequent encounter: Secondary | ICD-10-CM

## 2016-09-26 DIAGNOSIS — Z953 Presence of xenogenic heart valve: Secondary | ICD-10-CM | POA: Diagnosis not present

## 2016-09-26 DIAGNOSIS — I5032 Chronic diastolic (congestive) heart failure: Secondary | ICD-10-CM | POA: Diagnosis present

## 2016-09-26 DIAGNOSIS — J9610 Chronic respiratory failure, unspecified whether with hypoxia or hypercapnia: Secondary | ICD-10-CM | POA: Diagnosis not present

## 2016-09-26 DIAGNOSIS — E1122 Type 2 diabetes mellitus with diabetic chronic kidney disease: Secondary | ICD-10-CM | POA: Diagnosis not present

## 2016-09-26 DIAGNOSIS — E039 Hypothyroidism, unspecified: Secondary | ICD-10-CM | POA: Diagnosis not present

## 2016-09-26 DIAGNOSIS — Z809 Family history of malignant neoplasm, unspecified: Secondary | ICD-10-CM | POA: Insufficient documentation

## 2016-09-26 DIAGNOSIS — M109 Gout, unspecified: Secondary | ICD-10-CM | POA: Diagnosis not present

## 2016-09-26 DIAGNOSIS — Z7982 Long term (current) use of aspirin: Secondary | ICD-10-CM | POA: Diagnosis not present

## 2016-09-26 DIAGNOSIS — Z9989 Dependence on other enabling machines and devices: Secondary | ICD-10-CM

## 2016-09-26 LAB — BASIC METABOLIC PANEL
ANION GAP: 9 (ref 5–15)
BUN: 25 mg/dL — AB (ref 6–20)
CHLORIDE: 87 mmol/L — AB (ref 101–111)
CO2: 47 mmol/L — AB (ref 22–32)
Calcium: 9.7 mg/dL (ref 8.9–10.3)
Creatinine, Ser: 1.13 mg/dL (ref 0.61–1.24)
GFR calc Af Amer: >60 mL/min (ref >60)
GFR calc non Af Amer: >60 mL/min (ref >60)
GLUCOSE: 102 mg/dL — AB (ref 65–99)
POTASSIUM: 3.7 mmol/L (ref 3.5–5.1)
Sodium: 143 mmol/L (ref 135–145)

## 2016-09-26 LAB — BRAIN NATRIURETIC PEPTIDE: B Natriuretic Peptide: 53.3 pg/mL (ref 0.0–100.0)

## 2016-09-26 MED ORDER — AMOXICILLIN 500 MG PO TABS: 2000.0000 mg | ORAL_TABLET | ORAL | 2 refills | Status: AC | PRN

## 2016-09-26 MED ORDER — METOLAZONE 2.5 MG PO TABS: 2.5000 mg | ORAL_TABLET | ORAL | 3 refills | Status: AC | PRN

## 2016-09-26 MED ORDER — CARVEDILOL 3.125 MG PO TABS
3.1250 mg | ORAL_TABLET | Freq: Two times a day (BID) | ORAL | 3 refills | Status: AC
Start: 2016-09-26 — End: 2016-12-25

## 2016-09-26 NOTE — Patient Instructions (Addendum)
Start Carvedilol 3.125 mg Twice daily   Take Metolazone as needed  Take Amoxicillin 2000 mg (4 tabs) 1 hour before dental work  You have been referred to Cardiac Rehab at [facility]   Your physician recommends that you schedule a follow-up appointment in: 2 weeks

## 2016-09-26 NOTE — Progress Notes (Signed)
Advanced Heart Failure Medication Review by a Pharmacist  Does the patient  feel that his/her medications are working for him/her?  yes  Has the patient been experiencing any side effects to the medications prescribed?  no  Does the patient measure his/her own blood pressure or blood glucose at home?  yes   Does the patient have any problems obtaining medications due to transportation or finances?   no  Understanding of regimen: fair Understanding of indications: fair Potential of compliance: fair Patient understands to avoid NSAIDs. Patient understands to avoid decongestants.  Issues to address at subsequent visits: None   Pharmacist comments:  [name] is a 65 yo M presenting with his daughter and a rehab center discharge medication list with multiple discrepancies. Have updated his list based on patient's own report. He did not have any specific medication-related questions or concerns for me at this time.   Tyler Deis. Bonnye Fava, PharmD, BCPS, CPP Clinical Pharmacist Pager: [phone] Phone: [phone] 09/26/2016 12:53 PM      Time with patient: 10 minutes Preparation and documentation time: 2 minutes Total time: 12 minutes

## 2016-09-26 NOTE — Progress Notes (Signed)
Patient ID: Antonio [name], male   DOB: 1951-08-27, 65 y.o.   MRN: [id number]    Advanced Heart Failure Clinic Note   PCP: Dr Leonette Most [facility]) HF Cardiology: Dr. Shirlee Latch  65 yo with bioprosthetic AVR, chronic diastolic CHF, CKD and morbid obesity presents for CHF clinic followup.  Last echo in 10/16 showed normal bioprosthetic aortic valve and normal EF.  He was  admitted in 10/16 with volume overload and diuresed.  He was sent home on torsemide 80 qam/40 qpm.  He sees a nephrologist in [facility].   He was admitted again in 11/16 with acute on chronic diastolic CHF and inability to do ADLs at home.  He was diuresed and discharged to a rehab facility.    Admitted 01/01/16 - 01/09/2016 with hypercapneic respiratory failure 2/2 to CPAP non-compliance due to poor tolerance and ? HCAP. Treated with BiPAP and ABX. Volume thought to be stable. Creatinine better than baseline that admission and discharge weight 361 lbs.   Admitted 4/26 - 02/27/16 with dizziness and hypotension. Diuretics held but later restarted. Discharge weight was 371 pounds. AHC was to follow.   Admitted 8/4 - 06/11/16 with marked volume overload. Diuresed on IV lasix, metolazone, and diamox. Weight down from 414 lbs on admission to 383 lbs at discharge.  He returns presents today for regular follow up. Daughter present. Last seen in September.  Meds have been changed since being in rehab facility. Weight up 5 lbs from visit in September. Feels like he has been OK. Weight up to 395. Was closer to 390. Gets SOB walking to toilet and back. Sits in the his chair and uses a urinal beside him.  Eating salt foods and drinking well over 2 L.  On chronic 02. No current physical therapy at home. Still not using CPAP at home. Has a sleep study next month.   Labs (10/16): K 3.7, creatinine 1.76, hgb 11.7  Labs (11/16): K 3.6, creatinine 1.68 Labs (12/16): K 4.5, creatinine 2.28 Labs (5/17): K 4.1, creatinine 1.35 Labs (6/17): creatinine  2.14  PMH: 1. Severe aortic stenosis: s/p bioprosthetic AVR in 1/13.  2. 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   - RHC (12/16): mean RA 13, PA 36/14 mean 25, mean PCWP 21, CI 2.21.  - Echo (3/17) with EF 60-65%, grade II diastolic dysfunction, bioprosthetic aortic valve functioning normally, D-shaped interventricular septum with moderately dilated RV with severe systolic dysfunction.  - V/Q scan (3/17) without evidence for acute or chronic PE.  3. 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  4. Type II diabetes 5. Gout  6. CKD 7. Subclinical hypothyroidism 8. LHC (1/12) with normal coronaries. 9. Chronic LBBB 10. Morbid obesity  Family History  Problem Relation Age of Onset  . Cancer Mother   . Heart attack Father    Social History   Social History  . Marital status: Divorced    Spouse name: N/A  . Number of children: N/A  . Years of education: N/A   Occupational History  . retired travelling Engineer, site    Social History Main Topics  . Smoking status: Never Smoker  . Smokeless tobacco: Never Used  . Alcohol use Yes     Comment: 02/18/2015 "once or twice/yr I'll have a cocktail"      rare  . Drug use: No  . Sexual activity: Not Currently  Other Topics Concern  . Not on file   Social History Narrative  . No narrative on file   ROS: All systems reviewed and negative except as per HPI  Current Outpatient Prescriptions  Medication Sig Dispense Refill  . aspirin 81 MG tablet Take 81 mg by mouth daily.    Marland Kitchen. atorvastatin (LIPITOR) 40 MG tablet Take 40 mg by mouth daily at 6 PM.     . BAYER MICROLET LANCETS lancets As directed    . BD PEN NEEDLE NANO U/F 32G X 4 MM MISC As directed    . Coenzyme Q10 (CO Q-10 PO) Take 2 capsules by mouth daily.    . colchicine 0.6 MG tablet Take 1 tablet (0.6 mg total) by mouth daily. 30 tablet 0  . febuxostat  (ULORIC) 40 MG tablet Take 40 mg by mouth daily.     . fenofibrate 54 MG tablet Take 54 mg by mouth daily.  0  . fluticasone (FLONASE) 50 MCG/ACT nasal spray Place 1 spray into both nostrils daily. 16 g 2  . glucose blood (BAYER CONTOUR NEXT TEST) test strip As directed    . HYDROcodone-acetaminophen (NORCO) 7.5-325 MG tablet Take 1 tablet by mouth every 4 (four) hours as needed for moderate pain. 10 tablet 0  . levothyroxine (SYNTHROID, LEVOTHROID) 50 MCG tablet Take 50 mcg by mouth daily before breakfast.    . metolazone (ZAROXOLYN) 2.5 MG tablet Take 1 tablet (2.5 mg total) by mouth as needed (for weight gain, 3lbs overnight and 5lbs in 1 week, take extra KCL 20MeQ with this).    . NOVOLOG 100 UNIT/ML injection Inject 8 Units into the skin 3 (three) times daily with meals. Sliding scale  0  . Omega-3 Fatty Acids (FISH OIL) 1200 MG CAPS Take 1,200 mg by mouth daily.    . OXYGEN Inhale 4 L into the lungs at bedtime.    . potassium chloride SA (K-DUR,KLOR-CON) 20 MEQ tablet Take 20 mEq by mouth 2 (two) times daily. Take an additional 20 meq (1 tablet) if take metolazone    . Saccharomyces boulardii (PROBIOTIC) 250 MG CAPS Take 250 mg by mouth 2 (two) times daily.    Marland Kitchen. torsemide (DEMADEX) 20 MG tablet Take 4 tablets (80 mg total) by mouth 2 (two) times daily. 240 tablet 6  . Vitamin D, Ergocalciferol, (DRISDOL) 50000 units CAPS capsule Take 50,000 Units by mouth 2 (two) times a week. Tuesday and Friday    . vitamin C (ASCORBIC ACID) 500 MG tablet Take 500 mg by mouth daily.     No current facility-administered medications for this encounter.    Vitals:   09/26/16 1226  BP: 130/74  Pulse: (!) 103  SpO2: 91%  Weight: (!) 397 lb 6 oz (180.2 kg)   Wt Readings from Last 3 Encounters:  09/26/16 (!) 397 lb 6 oz (180.2 kg)  09/13/16 (!) 497 lb (225.4 kg)  07/19/16 (!) 392 lb (177.8 kg)   General: NAD in wheelchair.  Neck: Thick, JVP difficult to assess due to pt size. Does not appear elevated.  No thyromegaly or  Nodule noted.  Lungs: CTAB, normal effort. On 5 L via [place] CV: Nondisplaced PMI.  Heart regular S1/S2, no S3/S4, 1/6 SEM RUSB.  Chronic 1+ edema. With chronic venous changes. No carotid bruit appreciated. Normal pedal pulses.  Abdomen: Morbidly obese, soft, NT, ND, no HSM. No bruits or masses. +BS  Skin: Intact  Neurologic: Alert and oriented x 3.  Psych: Normal affect. Extremities: No  clubbing or cyanosis. BLE trace edema with bilateral venous stasis changes.   HEENT: Normal.   Assessment/Plan: 1. 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   - NYHA III.  - Volume status looks at least mildly elevated on exam, due in part to ongoing dietary and fluid indiscretion.  - Continue torsemide 80 mg bid. Will have pt take a metolazone 2.5 mg tablet tomorrow.  - Continue 2.5 mg metolazone with extra 20 meq of potassium as needed for weight gains of 3 lbs overnight or 5 lbs within one week.  If required more than once weekly, should call HF clinic. - Resume coreg 3.125 mg BID - Continue LOW SALT DIET . (<2000 mg daily) - Reinforced fluid restriction to < 2 L daily, sodium restriction to less than 2000 mg daily, and the importance of daily weights.   2. CKD Stage III - BMET today. 3. Bioprosthetic aortic valve: Stable on last echo.  - Will give dental prophylaxis for upcoming procedure. 4. Morbid Obesity:  - Have again discussed portion control. Needs to lose weight. Discussed importance of increasing activity.  5. OHS/OSA: Now on chronic 02. He needs repeat sleep study for CPAP titration.   - Had Sleep study. Sees Dr. Mayford Knife 10/10/16. 6. Chronic respiratory failure - Continue home 02. 7. Leg wounds - Stable at this time.  Resume coreg as above. Take metolazone tomorrow. Labs today. Close follow up in 2 weeks to re-assess volume status. Daughter present today and pt seems to have a new  motivation with her in mind. She is only here for 3 more weeks (Lives in [place]). Will make him next appointment within this time frame as above.   [name], PA-C  09/26/2016  Total time spent > 25 minutes. Over half that spent discussing the above.

## 2016-10-10 ENCOUNTER — Ambulatory Visit (HOSPITAL_COMMUNITY)
Admission: RE | Admit: 2016-10-10 | Discharge: 2016-10-10 | Disposition: A | Discharge status: Home / Self Care | Payer: Medicare Other | Source: Ambulatory Visit | Attending: Internal Medicine | Admitting: Internal Medicine

## 2016-10-10 ENCOUNTER — Telehealth: Payer: Self-pay

## 2016-10-10 ENCOUNTER — Ambulatory Visit: Payer: Medicare Other | Admitting: Cardiology

## 2016-10-10 VITALS — BP 126/84 | HR 98 | Wt 395.6 lb

## 2016-10-10 DIAGNOSIS — G4733 Obstructive sleep apnea (adult) (pediatric): Secondary | ICD-10-CM | POA: Diagnosis not present

## 2016-10-10 DIAGNOSIS — E119 Type 2 diabetes mellitus without complications: Secondary | ICD-10-CM

## 2016-10-10 DIAGNOSIS — I447 Left bundle-branch block, unspecified: Secondary | ICD-10-CM | POA: Insufficient documentation

## 2016-10-10 DIAGNOSIS — Z8249 Family history of ischemic heart disease and other diseases of the circulatory system: Secondary | ICD-10-CM | POA: Insufficient documentation

## 2016-10-10 DIAGNOSIS — E1122 Type 2 diabetes mellitus with diabetic chronic kidney disease: Secondary | ICD-10-CM | POA: Insufficient documentation

## 2016-10-10 DIAGNOSIS — IMO0001 Reserved for inherently not codable concepts without codable children: Secondary | ICD-10-CM

## 2016-10-10 DIAGNOSIS — J9611 Chronic respiratory failure with hypoxia: Secondary | ICD-10-CM | POA: Diagnosis not present

## 2016-10-10 DIAGNOSIS — E785 Hyperlipidemia, unspecified: Secondary | ICD-10-CM

## 2016-10-10 DIAGNOSIS — I5032 Chronic diastolic (congestive) heart failure: Secondary | ICD-10-CM | POA: Diagnosis not present

## 2016-10-10 DIAGNOSIS — Z809 Family history of malignant neoplasm, unspecified: Secondary | ICD-10-CM | POA: Insufficient documentation

## 2016-10-10 DIAGNOSIS — J9610 Chronic respiratory failure, unspecified whether with hypoxia or hypercapnia: Secondary | ICD-10-CM | POA: Insufficient documentation

## 2016-10-10 DIAGNOSIS — Z953 Presence of xenogenic heart valve: Secondary | ICD-10-CM | POA: Insufficient documentation

## 2016-10-10 DIAGNOSIS — Z9989 Dependence on other enabling machines and devices: Secondary | ICD-10-CM

## 2016-10-10 DIAGNOSIS — N183 Chronic kidney disease, stage 3 unspecified: Secondary | ICD-10-CM

## 2016-10-10 DIAGNOSIS — E039 Hypothyroidism, unspecified: Secondary | ICD-10-CM | POA: Insufficient documentation

## 2016-10-10 DIAGNOSIS — Z794 Long term (current) use of insulin: Secondary | ICD-10-CM

## 2016-10-10 DIAGNOSIS — J9612 Chronic respiratory failure with hypercapnia: Secondary | ICD-10-CM

## 2016-10-10 DIAGNOSIS — M109 Gout, unspecified: Secondary | ICD-10-CM | POA: Insufficient documentation

## 2016-10-10 DIAGNOSIS — I10 Essential (primary) hypertension: Secondary | ICD-10-CM

## 2016-10-10 DIAGNOSIS — Z7982 Long term (current) use of aspirin: Secondary | ICD-10-CM | POA: Insufficient documentation

## 2016-10-10 LAB — BASIC METABOLIC PANEL
ANION GAP: 12 (ref 5–15)
BUN: 31 mg/dL — ABNORMAL HIGH (ref 6–20)
CHLORIDE: 87 mmol/L — AB (ref 101–111)
CO2: 42 mmol/L — ABNORMAL HIGH (ref 22–32)
Calcium: 9.4 mg/dL (ref 8.9–10.3)
Creatinine, Ser: 1.14 mg/dL (ref 0.61–1.24)
Glucose, Bld: 203 mg/dL — ABNORMAL HIGH (ref 65–99)
POTASSIUM: 4.6 mmol/L (ref 3.5–5.1)
SODIUM: 141 mmol/L (ref 135–145)

## 2016-10-10 LAB — TSH: TSH: 2.742 u[IU]/mL (ref 0.350–4.500)

## 2016-10-10 LAB — CBC
HEMATOCRIT: 40.5 % (ref 39.0–52.0)
HEMOGLOBIN: 12.1 g/dL — AB (ref 13.0–17.0)
MCH: 26.8 pg (ref 26.0–34.0)
MCHC: 29.9 g/dL — AB (ref 30.0–36.0)
MCV: 89.6 fL (ref 78.0–100.0)
Platelets: 263 10*3/uL (ref 150–400)
RBC: 4.52 MIL/uL (ref 4.22–5.81)
RDW: 14.9 % (ref 11.5–15.5)
WBC: 11.3 10*3/uL — AB (ref 4.0–10.5)

## 2016-10-10 LAB — VITAMIN B12: VITAMIN B 12: 1131 pg/mL — AB (ref 180–914)

## 2016-10-10 LAB — BRAIN NATRIURETIC PEPTIDE: B Natriuretic Peptide: 86.1 pg/mL (ref 0.0–100.0)

## 2016-10-10 NOTE — Patient Instructions (Signed)
Routine lab work today. Will notify you of abnormal results, otherwise no news is good news!  No changes to medication.  Follow up 4 weeks with Otilio Saber PA-C.  Merry Christmas and Happy New Year!  Do the following things EVERYDAY: 1) Weigh yourself in the morning before breakfast. Write it down and keep it in a log. 2) Take your medicines as prescribed 3) Eat low salt foods-Limit salt (sodium) to 2000 mg per day.  4) Stay as active as you can everyday 5) Limit all fluids for the day to less than 2 liters

## 2016-10-10 NOTE — Progress Notes (Signed)
Patient ID: Antonio [name], male   DOB: 08-Aug-1951, 65 y.o.   MRN: [id number]    Advanced Heart Failure Clinic Note   PCP: Dr Leonette [facility]) HF Cardiology: Dr. Shirlee Latch  65 yo with bioprosthetic AVR, chronic diastolic CHF, CKD and morbid obesity presents for CHF clinic followup.  Last echo in 10/16 showed normal bioprosthetic aortic valve and normal EF.  He was  admitted in 10/16 with volume overload and diuresed.  He was sent home on torsemide 80 qam/40 qpm.  He sees a nephrologist in [facility].   He was admitted again in 11/16 with acute on chronic diastolic CHF and inability to do ADLs at home.  He was diuresed and discharged to a rehab facility.    Admitted 01/01/16 - 01/09/2016 with hypercapneic respiratory failure 2/2 to CPAP non-compliance due to poor tolerance and ? HCAP. Treated with BiPAP and ABX. Volume thought to be stable. Creatinine better than baseline that admission and discharge weight 361 lbs.   Admitted 4/26 - 02/27/16 with dizziness and hypotension. Diuretics held but later restarted. Discharge weight was 371 pounds. AHC was to follow.   Admitted 8/4 - 06/11/16 with marked volume overload. Diuresed on IV lasix, metolazone, and diamox. Weight down from 414 lbs on admission to 383 lbs at discharge.  He presents today for regular follow up.  Weight down 2 lbs from last visit. Has a home health aide who is watching his fluid and preparing his meals.  She is also encouraging him to be more mobile. Able to stand for a whole bath and less hesitant getting around the house. Still using urinals for urgency, but getting up and emptying them himself. Sees Dr Mayford Knife today to try and help get CPAP. Feels like his breathing is better. Family is working on getting him into an assisted living facility.   Labs (10/16): K 3.7, creatinine 1.76, hgb 11.7  Labs (11/16): K 3.6, creatinine 1.68 Labs (12/16): K 4.5, creatinine 2.28 Labs (5/17): K 4.1, creatinine 1.35 Labs (6/17): creatinine  2.14  PMH: 1. Severe aortic stenosis: s/p bioprosthetic AVR in 1/13.  2. 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   - RHC (12/16): mean RA 13, PA 36/14 mean 25, mean PCWP 21, CI 2.21.  - Echo (3/17) with EF 60-65%, grade II diastolic dysfunction, bioprosthetic aortic valve functioning normally, D-shaped interventricular septum with moderately dilated RV with severe systolic dysfunction.  - V/Q scan (3/17) without evidence for acute or chronic PE.  3. 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  4. Type II diabetes 5. Gout  6. CKD 7. Subclinical hypothyroidism 8. LHC (1/12) with normal coronaries. 9. Chronic LBBB 10. Morbid obesity  Family History  Problem Relation Age of Onset  . Cancer Mother   . Heart attack Father    Social History   Social History  . Marital status: Divorced    Spouse name: N/A  . Number of children: N/A  . Years of education: N/A   Occupational History  . retired travelling Engineer, site    Social History Main Topics  . Smoking status: Never Smoker  . Smokeless tobacco: Never Used  . Alcohol use Yes     Comment: 02/18/2015 "once or twice/yr I'll have a cocktail"      rare  . Drug use: No  . Sexual activity: Not Currently   Other Topics  Concern  . Not on file   Social History Narrative  . No narrative on file   ROS: All systems reviewed and negative except as per HPI  Current Outpatient Prescriptions  Medication Sig Dispense Refill  . allopurinol (ZYLOPRIM) 100 MG tablet Take 100 mg by mouth daily.    Marland Kitchen amoxicillin (AMOXIL) 500 MG tablet Take 4 tablets (2,000 mg total) by mouth as needed (1 hour prior to dental work). 4 tablet 2  . aspirin 81 MG tablet Take 81 mg by mouth daily.    Marland Kitchen atorvastatin (LIPITOR) 40 MG tablet Take 40 mg by mouth daily at 6 PM.     . BAYER MICROLET LANCETS lancets As directed    . BD PEN NEEDLE  NANO U/F 32G X 4 MM MISC As directed    . carvedilol (COREG) 3.125 MG tablet Take 1 tablet (3.125 mg total) by mouth 2 (two) times daily. 180 tablet 3  . Coenzyme Q10 (CO Q-10 PO) Take 2 capsules by mouth daily.    . colchicine 0.6 MG tablet Take 1 tablet (0.6 mg total) by mouth daily. 30 tablet 0  . febuxostat (ULORIC) 40 MG tablet Take 40 mg by mouth daily.     . fenofibrate 54 MG tablet Take 54 mg by mouth daily.  0  . fluticasone (FLONASE) 50 MCG/ACT nasal spray Place 1 spray into both nostrils daily. 16 g 2  . glucose blood (BAYER CONTOUR NEXT TEST) test strip As directed    . HYDROcodone-acetaminophen (NORCO) 7.5-325 MG tablet Take 1 tablet by mouth every 4 (four) hours as needed for moderate pain. 10 tablet 0  . insulin aspart (NOVOLOG) 100 UNIT/ML injection Inject into the skin 3 (three) times daily before meals. Per sliding scale    . Insulin Glargine (BASAGLAR KWIKPEN [place]) Inject 60 Units into the skin 2 (two) times daily.    Marland Kitchen levothyroxine (SYNTHROID, LEVOTHROID) 50 MCG tablet Take 50 mcg by mouth daily before breakfast.    . metolazone (ZAROXOLYN) 2.5 MG tablet Take 1 tablet (2.5 mg total) by mouth as needed (for weight gain, 3lbs overnight and 5lbs in 1 week, take extra KCL with this). 10 tablet 3  . Omega-3 Fatty Acids (FISH OIL) 1200 MG CAPS Take 1,200 mg by mouth daily.    . OXYGEN Inhale 4 L into the lungs at bedtime.    . potassium chloride SA (K-DUR,KLOR-CON) 20 MEQ tablet Take 40 mEq by mouth 2 (two) times daily. Take an additional 20 meq (1 tablet) if take metolazone     . Saccharomyces boulardii (PROBIOTIC) 250 MG CAPS Take 250 mg by mouth 2 (two) times daily.    Marland Kitchen torsemide (DEMADEX) 20 MG tablet Take 4 tablets (80 mg total) by mouth 2 (two) times daily. 240 tablet 6  . vitamin C (ASCORBIC ACID) 500 MG tablet Take 500 mg by mouth daily.     No current facility-administered medications for this encounter.    Vitals:   10/10/16 1154  BP: 126/84  Pulse: 98  SpO2:  (!) 89%  Weight: (!) 395 lb 9.6 oz (179.4 kg)   Wt Readings from Last 3 Encounters:  10/10/16 (!) 395 lb 9.6 oz (179.4 kg)  09/26/16 (!) 397 lb 6 oz (180.2 kg)  09/13/16 (!) 497 lb (225.4 kg)   General: in WC. NAD.  Neck: Thick, JVP difficult to assess due to size. Does not appear elevated. No thyromegaly or nodule noted.   Lungs: Clear, normal effort. On 5 L  via [place] CV: Nondisplaced PMI.  Heart regular S1/S2, no S3/S4, 1/6 SEM RUSB.  Chronic 1+ edema. Gross chronic venous stasis changes. No carotid bruit appreciated. Normal pedal pulses.  Abdomen: Morbidly obese, soft, NT, ND, no HSM. No bruits or masses. +BS  Skin: Intact  Neurologic: Alert and oriented x 3.  Psych: Normal affect. Extremities: No clubbing or cyanosis. BLE trace to 1+ chronic edema with bilateral venous stasis changes.   HEENT: Normal.   Assessment/Plan: 1. 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   - NYHA III.  - Volume status looks OK.   - Continue torsemide 80 mg bid. BMET/BNP today.  - Can take metolazone 2.5 mg as needed. for weight gains of 3 lbs overnight or 5 lbs within one week.  If required more than once weekly, should call HF clinic. - Continue coreg 3.125 mg BID - Continue LOW SALT DIET . (<2000 mg daily) - Reinforced fluid restriction to < 2 L daily, sodium restriction to less than 2000 mg daily, and the importance of daily weights.   2. CKD Stage III - BMET study. Has been stable.  3. Bioprosthetic aortic valve: Stable on last echo.  - Knows that he will need dental prophylaxis for any procedures.  4. Morbid Obesity:  - Needs to lose a tremendous amount of weight. Encouraged to continue to increase activity as able.   - Reportedly doing better with diet with Home Aide.         5. OHS/OSA: Now on chronic 02. He needs repeat sleep study for CPAP titration.   - Had Sleep study. Sees Dr. Mayford Knifeurner this afternoon.   6. Chronic respiratory failure - Continue home 02. 7. Leg wounds - Stable at this time. 8. Subclinical hypothyroidism - He has taken himself off of his synthroid. Will check TFTs today. Will send to PCP if he needs to resume.  9. Fatigue - Not new, think this is just his daughter seeing him like this for the first time.  - Will also check CBC and B12 today.  If B12 needed, or anemic, will forward to PCP.   Relatively stable.  Hopefully he will continue to progress.  Labs today as above.  Follow up 4 weeks.  Graciella FreerMichael Andrew Tillery, PA-C  10/10/2016

## 2016-10-10 NOTE — Telephone Encounter (Signed)
Patient in today for OSA follow-up.  Cancelled appointment - patient has not had CPAP titration.  Per Dr. Mayford Knife, ordered CPAP titration for scheduling. Patient agrees with treatment plan.

## 2016-10-10 NOTE — Progress Notes (Deleted)
Cardiology Office Note    Date:  10/10/2016   ID:  Antonio [name], DOB 04/08/1951, MRN [id number]  PCP:  Sid FalconKALISH, MICHAEL J, MD  Cardiologist:  Armanda Magicraci Turner, MD   No chief complaint on file.   History of Present Illness:  Antonio [name] is a 65 y.o. male with a history f AS and CHF who was referred by Dr. Shirlee LatchMcLean for evaluation of OSA.  He has had problems with excessive daytime sleepiness and has an Epworth sleepiness score of 11.  He underwent PSG showing moderate OSA with an AHI of 26/hr.      Past Medical History:  Diagnosis Date  . Anxiety   . Aortic stenosis    a. s/p AVR 11/29/11  . Arthritis   . Blindness of right eye    a. due to amblyopia as child  . Chronic diastolic heart failure (HCC)    a. Echo 08/11/2015 LVEF 55-60% with normal RV function  . DM2 (diabetes mellitus, type 2) (HCC)   . Gout   . Hepatitis   . History of acute renal failure   . HLD (hyperlipidemia)   . HTN (hypertension)   . Hx MRSA infection    a. thigh abcess 2006  . OSA on CPAP    moderate with AHI 26/hr    Past Surgical History:  Procedure Laterality Date  . AORTIC VALVE REPLACEMENT  11/29/2011   Procedure: AORTIC VALVE REPLACEMENT (AVR);  Surgeon: Kathlee NationsPeter Van Suann Larryrigt III, MD;  [facility];  Service: Open Heart Surgery;  Laterality: N/A;  with nitric oxide  . BACK SURGERY    . CARDIAC CATHETERIZATION    . CARDIAC CATHETERIZATION N/A 10/14/2015   Procedure: Right Heart Cath;  Surgeon: Laurey Moralealton S McLean, MD;  [facility];  Service: Cardiovascular;  Laterality: N/A;  . CARDIAC VALVE REPLACEMENT    . INCISION AND DRAINAGE ABSCESS Left 08/2005   "upper/inner thigh; that's where the MRSA was"  . LUMBAR LAMINECTOMY  10/30/1983  . RIGHT HEART CATHETERIZATION N/A 11/28/2011   Procedure: RIGHT HEART CATH;  Surgeon: Kathleene Hazelhristopher D McAlhany, MD;  [facility];  Service: Cardiovascular;  Laterality: N/A;    Current Medications: Outpatient Medications Prior to Visit    Medication Sig Dispense Refill  . allopurinol (ZYLOPRIM) 100 MG tablet Take 100 mg by mouth daily.    Marland Kitchen. amoxicillin (AMOXIL) 500 MG tablet Take 4 tablets (2,000 mg total) by mouth as needed (1 hour prior to dental work). 4 tablet 2  . aspirin 81 MG tablet Take 81 mg by mouth daily.    Marland Kitchen. atorvastatin (LIPITOR) 40 MG tablet Take 40 mg by mouth daily at 6 PM.     . BAYER MICROLET LANCETS lancets As directed    . BD PEN NEEDLE NANO U/F 32G X 4 MM MISC As directed    . carvedilol (COREG) 3.125 MG tablet Take 1 tablet (3.125 mg total) by mouth 2 (two) times daily. 180 tablet 3  . Coenzyme Q10 (CO Q-10 PO) Take 2 capsules by mouth daily.    . colchicine 0.6 MG tablet Take 1 tablet (0.6 mg total) by mouth daily. 30 tablet 0  . febuxostat (ULORIC) 40 MG tablet Take 40 mg by mouth daily.     . fenofibrate 54 MG tablet Take 54 mg by mouth daily.  0  . fluticasone (FLONASE) 50 MCG/ACT nasal spray Place 1 spray into both nostrils daily. 16 g 2  . glucose blood (  BAYER CONTOUR NEXT TEST) test strip As directed    . HYDROcodone-acetaminophen (NORCO) 7.5-325 MG tablet Take 1 tablet by mouth every 4 (four) hours as needed for moderate pain. 10 tablet 0  . insulin aspart (NOVOLOG) 100 UNIT/ML injection Inject into the skin 3 (three) times daily before meals. Per sliding scale    . Insulin Glargine (BASAGLAR KWIKPEN [place]) Inject 60 Units into the skin 2 (two) times daily.    Marland Kitchen levothyroxine (SYNTHROID, LEVOTHROID) 50 MCG tablet Take 50 mcg by mouth daily before breakfast.    . metolazone (ZAROXOLYN) 2.5 MG tablet Take 1 tablet (2.5 mg total) by mouth as needed (for weight gain, 3lbs overnight and 5lbs in 1 week, take extra KCL with this). 10 tablet 3  . Omega-3 Fatty Acids (FISH OIL) 1200 MG CAPS Take 1,200 mg by mouth daily.    . OXYGEN Inhale 4 L into the lungs at bedtime.    . potassium chloride SA (K-DUR,KLOR-CON) 20 MEQ tablet Take 40 mEq by mouth 2 (two) times daily. Take an additional 20 meq (1 tablet)  if take metolazone     . Saccharomyces boulardii (PROBIOTIC) 250 MG CAPS Take 250 mg by mouth 2 (two) times daily.    Marland Kitchen torsemide (DEMADEX) 20 MG tablet Take 4 tablets (80 mg total) by mouth 2 (two) times daily. 240 tablet 6  . vitamin C (ASCORBIC ACID) 500 MG tablet Take 500 mg by mouth daily.     No facility-administered medications prior to visit.      Allergies:   Patient has no known allergies.   Social History   Social History  . Marital status: Divorced    Spouse name: N/A  . Number of children: N/A  . Years of education: N/A   Occupational History  . retired travelling Engineer, site    Social History Main Topics  . Smoking status: Never Smoker  . Smokeless tobacco: Never Used  . Alcohol use Yes     Comment: 02/18/2015 "once or twice/yr I'll have a cocktail"      rare  . Drug use: No  . Sexual activity: Not Currently   Other Topics Concern  . Not on file   Social History Narrative  . No narrative on file     Family History:  The patient's ***family history includes Cancer in his mother; Heart attack in his father.   ROS:   Please see the history of present illness.    ROS All other systems reviewed and are negative.  No flowsheet data found.     PHYSICAL EXAM:   VS:  There were no vitals taken for this visit.   GEN: Well nourished, well developed, in no acute distress  HEENT: normal  Neck: no JVD, carotid bruits, or masses Cardiac: ***RRR; no murmurs, rubs, or gallops,no edema.  Intact distal pulses bilaterally.  Respiratory:  clear to auscultation bilaterally, normal work of breathing GI: soft, nontender, nondistended, + BS MS: no deformity or atrophy  Skin: warm and dry, no rash Neuro:  Alert and Oriented x 3, Strength and sensation are intact Psych: euthymic mood, full affect  Wt Readings from Last 3 Encounters:  10/10/16 (!) 395 lb 9.6 oz (179.4 kg)  09/26/16 (!) 397 lb 6 oz (180.2 kg)  09/13/16 (!) 497 lb (225.4 kg)      Studies/Labs  Reviewed:   EKG:  EKG is*** ordered today.  The ekg ordered today demonstrates ***  Recent Labs: 02/25/2016: TSH 3.386 06/01/2016: ALT 19  06/07/2016: Magnesium 2.4 06/08/2016: Platelets 288 09/13/2016: Hemoglobin 14.3 09/26/2016: B Natriuretic Peptide 53.3; BUN 25; Creatinine, Ser 1.13; Potassium 3.7; Sodium 143   Lipid Panel    Component Value Date/Time   CHOL 158 09/08/2015 1032   TRIG 172 (H) 09/08/2015 1032   HDL 36 (L) 09/08/2015 1032   CHOLHDL 4.4 09/08/2015 1032   VLDL 34 09/08/2015 1032   LDLCALC 88 09/08/2015 1032   LDLDIRECT 106 (H) 09/03/2007 1943    Additional studies/ records that were reviewed today include:  ***    ASSESSMENT:    No diagnosis found.   PLAN:  In order of problems listed above:  1. ***    Medication Adjustments/Labs and Tests Ordered: Current medicines are reviewed at length with the patient today.  Concerns regarding medicines are outlined above.  Medication changes, Labs and Tests ordered today are listed in the Patient Instructions below.  There are no Patient Instructions on file for this visit.   Signed, Armanda Magic, MD  10/10/2016 1:10 PM    [facility] [address] [place], [place], [place]  [address] Phone: [phone]; Fax: [phone]

## 2016-10-11 LAB — T3, FREE: T3 FREE: 2.3 pg/mL (ref 2.0–4.4)

## 2016-10-13 ENCOUNTER — Encounter (HOSPITAL_COMMUNITY): Payer: Self-pay | Admitting: Emergency Medicine

## 2016-10-13 ENCOUNTER — Emergency Department (HOSPITAL_COMMUNITY): Payer: Medicare Other

## 2016-10-13 ENCOUNTER — Inpatient Hospital Stay (HOSPITAL_COMMUNITY)
Admission: EM | Admit: 2016-10-13 | Discharge: 2016-10-29 | DRG: 291 | Disposition: E | Payer: Medicare Other | Attending: Pulmonary Disease | Admitting: Pulmonary Disease

## 2016-10-13 ENCOUNTER — Inpatient Hospital Stay (HOSPITAL_COMMUNITY): Payer: Medicare Other

## 2016-10-13 DIAGNOSIS — I2729 Other secondary pulmonary hypertension: Secondary | ICD-10-CM | POA: Diagnosis present

## 2016-10-13 DIAGNOSIS — K759 Inflammatory liver disease, unspecified: Secondary | ICD-10-CM | POA: Diagnosis present

## 2016-10-13 DIAGNOSIS — Z6841 Body Mass Index (BMI) 40.0 and over, adult: Secondary | ICD-10-CM | POA: Diagnosis not present

## 2016-10-13 DIAGNOSIS — N183 Chronic kidney disease, stage 3 unspecified: Secondary | ICD-10-CM | POA: Diagnosis present

## 2016-10-13 DIAGNOSIS — J9690 Respiratory failure, unspecified, unspecified whether with hypoxia or hypercapnia: Secondary | ICD-10-CM

## 2016-10-13 DIAGNOSIS — G9341 Metabolic encephalopathy: Secondary | ICD-10-CM | POA: Diagnosis present

## 2016-10-13 DIAGNOSIS — D638 Anemia in other chronic diseases classified elsewhere: Secondary | ICD-10-CM | POA: Diagnosis present

## 2016-10-13 DIAGNOSIS — I871 Compression of vein: Secondary | ICD-10-CM | POA: Diagnosis present

## 2016-10-13 DIAGNOSIS — Z952 Presence of prosthetic heart valve: Secondary | ICD-10-CM | POA: Diagnosis not present

## 2016-10-13 DIAGNOSIS — Z7982 Long term (current) use of aspirin: Secondary | ICD-10-CM

## 2016-10-13 DIAGNOSIS — I272 Pulmonary hypertension, unspecified: Secondary | ICD-10-CM | POA: Diagnosis not present

## 2016-10-13 DIAGNOSIS — Z515 Encounter for palliative care: Secondary | ICD-10-CM | POA: Diagnosis not present

## 2016-10-13 DIAGNOSIS — Z22322 Carrier or suspected carrier of Methicillin resistant Staphylococcus aureus: Secondary | ICD-10-CM

## 2016-10-13 DIAGNOSIS — Z981 Arthrodesis status: Secondary | ICD-10-CM

## 2016-10-13 DIAGNOSIS — Z8614 Personal history of Methicillin resistant Staphylococcus aureus infection: Secondary | ICD-10-CM

## 2016-10-13 DIAGNOSIS — I2781 Cor pulmonale (chronic): Secondary | ICD-10-CM | POA: Diagnosis present

## 2016-10-13 DIAGNOSIS — G9340 Encephalopathy, unspecified: Secondary | ICD-10-CM | POA: Diagnosis not present

## 2016-10-13 DIAGNOSIS — E1165 Type 2 diabetes mellitus with hyperglycemia: Secondary | ICD-10-CM | POA: Diagnosis present

## 2016-10-13 DIAGNOSIS — H5461 Unqualified visual loss, right eye, normal vision left eye: Secondary | ICD-10-CM | POA: Diagnosis present

## 2016-10-13 DIAGNOSIS — J9622 Acute and chronic respiratory failure with hypercapnia: Secondary | ICD-10-CM | POA: Diagnosis present

## 2016-10-13 DIAGNOSIS — J9692 Respiratory failure, unspecified with hypercapnia: Secondary | ICD-10-CM | POA: Diagnosis present

## 2016-10-13 DIAGNOSIS — I130 Hypertensive heart and chronic kidney disease with heart failure and stage 1 through stage 4 chronic kidney disease, or unspecified chronic kidney disease: Principal | ICD-10-CM | POA: Diagnosis present

## 2016-10-13 DIAGNOSIS — Z953 Presence of xenogenic heart valve: Secondary | ICD-10-CM

## 2016-10-13 DIAGNOSIS — IMO0001 Reserved for inherently not codable concepts without codable children: Secondary | ICD-10-CM

## 2016-10-13 DIAGNOSIS — R4182 Altered mental status, unspecified: Secondary | ICD-10-CM | POA: Diagnosis present

## 2016-10-13 DIAGNOSIS — R609 Edema, unspecified: Secondary | ICD-10-CM | POA: Diagnosis not present

## 2016-10-13 DIAGNOSIS — Z79899 Other long term (current) drug therapy: Secondary | ICD-10-CM

## 2016-10-13 DIAGNOSIS — I480 Paroxysmal atrial fibrillation: Secondary | ICD-10-CM | POA: Diagnosis present

## 2016-10-13 DIAGNOSIS — J9602 Acute respiratory failure with hypercapnia: Secondary | ICD-10-CM | POA: Diagnosis not present

## 2016-10-13 DIAGNOSIS — E873 Alkalosis: Secondary | ICD-10-CM | POA: Diagnosis present

## 2016-10-13 DIAGNOSIS — Z9119 Patient's noncompliance with other medical treatment and regimen: Secondary | ICD-10-CM

## 2016-10-13 DIAGNOSIS — I5082 Biventricular heart failure: Secondary | ICD-10-CM | POA: Diagnosis present

## 2016-10-13 DIAGNOSIS — I10 Essential (primary) hypertension: Secondary | ICD-10-CM | POA: Diagnosis not present

## 2016-10-13 DIAGNOSIS — E119 Type 2 diabetes mellitus without complications: Secondary | ICD-10-CM | POA: Diagnosis not present

## 2016-10-13 DIAGNOSIS — I5033 Acute on chronic diastolic (congestive) heart failure: Secondary | ICD-10-CM | POA: Diagnosis present

## 2016-10-13 DIAGNOSIS — J9621 Acute and chronic respiratory failure with hypoxia: Secondary | ICD-10-CM | POA: Diagnosis present

## 2016-10-13 DIAGNOSIS — Z66 Do not resuscitate: Secondary | ICD-10-CM | POA: Diagnosis not present

## 2016-10-13 DIAGNOSIS — I878 Other specified disorders of veins: Secondary | ICD-10-CM | POA: Diagnosis present

## 2016-10-13 DIAGNOSIS — Z8249 Family history of ischemic heart disease and other diseases of the circulatory system: Secondary | ICD-10-CM

## 2016-10-13 DIAGNOSIS — M109 Gout, unspecified: Secondary | ICD-10-CM | POA: Diagnosis present

## 2016-10-13 DIAGNOSIS — E876 Hypokalemia: Secondary | ICD-10-CM | POA: Diagnosis not present

## 2016-10-13 DIAGNOSIS — Z23 Encounter for immunization: Secondary | ICD-10-CM | POA: Diagnosis present

## 2016-10-13 DIAGNOSIS — Z9289 Personal history of other medical treatment: Secondary | ICD-10-CM

## 2016-10-13 DIAGNOSIS — Z951 Presence of aortocoronary bypass graft: Secondary | ICD-10-CM

## 2016-10-13 DIAGNOSIS — J810 Acute pulmonary edema: Secondary | ICD-10-CM | POA: Diagnosis not present

## 2016-10-13 DIAGNOSIS — E662 Morbid (severe) obesity with alveolar hypoventilation: Secondary | ICD-10-CM | POA: Diagnosis present

## 2016-10-13 DIAGNOSIS — E785 Hyperlipidemia, unspecified: Secondary | ICD-10-CM | POA: Diagnosis present

## 2016-10-13 DIAGNOSIS — D72829 Elevated white blood cell count, unspecified: Secondary | ICD-10-CM | POA: Diagnosis present

## 2016-10-13 DIAGNOSIS — E1122 Type 2 diabetes mellitus with diabetic chronic kidney disease: Secondary | ICD-10-CM | POA: Diagnosis present

## 2016-10-13 DIAGNOSIS — J9601 Acute respiratory failure with hypoxia: Secondary | ICD-10-CM | POA: Diagnosis not present

## 2016-10-13 DIAGNOSIS — E669 Obesity, unspecified: Secondary | ICD-10-CM | POA: Diagnosis present

## 2016-10-13 DIAGNOSIS — I2510 Atherosclerotic heart disease of native coronary artery without angina pectoris: Secondary | ICD-10-CM | POA: Diagnosis present

## 2016-10-13 DIAGNOSIS — Z7189 Other specified counseling: Secondary | ICD-10-CM

## 2016-10-13 DIAGNOSIS — Z9981 Dependence on supplemental oxygen: Secondary | ICD-10-CM

## 2016-10-13 DIAGNOSIS — I5031 Acute diastolic (congestive) heart failure: Secondary | ICD-10-CM

## 2016-10-13 DIAGNOSIS — E8770 Fluid overload, unspecified: Secondary | ICD-10-CM | POA: Diagnosis not present

## 2016-10-13 DIAGNOSIS — E039 Hypothyroidism, unspecified: Secondary | ICD-10-CM | POA: Diagnosis present

## 2016-10-13 DIAGNOSIS — Z794 Long term (current) use of insulin: Secondary | ICD-10-CM

## 2016-10-13 DIAGNOSIS — Z79891 Long term (current) use of opiate analgesic: Secondary | ICD-10-CM

## 2016-10-13 DIAGNOSIS — Z781 Physical restraint status: Secondary | ICD-10-CM

## 2016-10-13 DIAGNOSIS — F419 Anxiety disorder, unspecified: Secondary | ICD-10-CM | POA: Diagnosis present

## 2016-10-13 LAB — COMPREHENSIVE METABOLIC PANEL
ALT: 18 U/L (ref 17–63)
AST: 18 U/L (ref 15–41)
Albumin: 3.1 g/dL — ABNORMAL LOW (ref 3.5–5.0)
Alkaline Phosphatase: 78 U/L (ref 38–126)
Anion gap: 7 (ref 5–15)
BUN: 27 mg/dL — AB (ref 6–20)
CHLORIDE: 89 mmol/L — AB (ref 101–111)
CO2: 47 mmol/L — AB (ref 22–32)
CREATININE: 1.03 mg/dL (ref 0.61–1.24)
Calcium: 9 mg/dL (ref 8.9–10.3)
GFR calc Af Amer: >60 mL/min (ref >60)
GFR calc non Af Amer: >60 mL/min (ref >60)
GLUCOSE: 207 mg/dL — AB (ref 65–99)
Potassium: 4.8 mmol/L (ref 3.5–5.1)
SODIUM: 143 mmol/L (ref 135–145)
Total Bilirubin: 0.6 mg/dL (ref 0.3–1.2)
Total Protein: 7.1 g/dL (ref 6.5–8.1)

## 2016-10-13 LAB — I-STAT VENOUS BLOOD GAS, ED
Acid-Base Excess: 24 mmol/L — ABNORMAL HIGH (ref 0.0–2.0)
BICARBONATE: 53.1 mmol/L — AB (ref 20.0–28.0)
O2 SAT: 53 %
PCO2 VEN: 81 mmHg — AB (ref 44.0–60.0)
PO2 VEN: 30 mmHg — AB (ref 32.0–45.0)
pH, Ven: 7.424 (ref 7.250–7.430)

## 2016-10-13 LAB — URINALYSIS, ROUTINE W REFLEX MICROSCOPIC
Bilirubin Urine: NEGATIVE
Glucose, UA: 50 mg/dL — AB
HGB URINE DIPSTICK: NEGATIVE
Ketones, ur: NEGATIVE mg/dL
Leukocytes, UA: NEGATIVE
NITRITE: NEGATIVE
PH: 6 (ref 5.0–8.0)
Protein, ur: 100 mg/dL — AB
SPECIFIC GRAVITY, URINE: 1.012 (ref 1.005–1.030)
SQUAMOUS EPITHELIAL / LPF: NONE SEEN

## 2016-10-13 LAB — CBC WITH DIFFERENTIAL/PLATELET
Basophils Absolute: 0 10*3/uL (ref 0.0–0.1)
Basophils Relative: 0 %
EOS PCT: 1 %
Eosinophils Absolute: 0.1 10*3/uL (ref 0.0–0.7)
HCT: 40.4 % (ref 39.0–52.0)
Hemoglobin: 11.7 g/dL — ABNORMAL LOW (ref 13.0–17.0)
LYMPHS ABS: 2.4 10*3/uL (ref 0.7–4.0)
LYMPHS PCT: 25 %
MCH: 26.5 pg (ref 26.0–34.0)
MCHC: 29 g/dL — ABNORMAL LOW (ref 30.0–36.0)
MCV: 91.6 fL (ref 78.0–100.0)
MONO ABS: 0.8 10*3/uL (ref 0.1–1.0)
Monocytes Relative: 8 %
Neutro Abs: 6.5 10*3/uL (ref 1.7–7.7)
Neutrophils Relative %: 66 %
PLATELETS: 281 10*3/uL (ref 150–400)
RBC: 4.41 MIL/uL (ref 4.22–5.81)
RDW: 14.5 % (ref 11.5–15.5)
WBC: 9.9 10*3/uL (ref 4.0–10.5)

## 2016-10-13 LAB — I-STAT CG4 LACTIC ACID, ED
Lactic Acid, Venous: 1.86 mmol/L (ref 0.5–1.9)
Lactic Acid, Venous: 0.69 mmol/L (ref 0.5–1.9)

## 2016-10-13 LAB — CBG MONITORING, ED
GLUCOSE-CAPILLARY: 212 mg/dL — AB (ref 65–99)
GLUCOSE-CAPILLARY: 161 mg/dL — AB (ref 65–99)

## 2016-10-13 LAB — MRSA PCR SCREENING: MRSA by PCR: NEGATIVE

## 2016-10-13 LAB — GLUCOSE, CAPILLARY: Glucose-Capillary: 150 mg/dL — ABNORMAL HIGH (ref 65–99)

## 2016-10-13 LAB — MAGNESIUM: Magnesium: 2.1 mg/dL (ref 1.7–2.4)

## 2016-10-13 LAB — I-STAT TROPONIN, ED: Troponin i, poc: 0.03 ng/mL (ref 0.00–0.08)

## 2016-10-13 LAB — BRAIN NATRIURETIC PEPTIDE: B Natriuretic Peptide: 71.3 pg/mL (ref 0.0–100.0)

## 2016-10-13 LAB — AMMONIA: AMMONIA: 41 umol/L — AB (ref 9–35)

## 2016-10-13 MED ORDER — SODIUM CHLORIDE 0.9 % IV SOLN
250.0000 mL | INTRAVENOUS | Status: DC | PRN
  Administered 2016-10-13 – 2016-10-21 (×2): 250 mL via INTRAVENOUS

## 2016-10-13 MED ORDER — INSULIN ASPART 100 UNIT/ML ~~LOC~~ SOLN: 0.0000 [IU] | Freq: Three times a day (TID) | SUBCUTANEOUS | Status: DC

## 2016-10-13 MED ORDER — HYDRALAZINE HCL 20 MG/ML IJ SOLN: 10.0000 mg | Freq: Four times a day (QID) | INTRAMUSCULAR | Status: DC | PRN

## 2016-10-13 MED ORDER — PROPOFOL 1000 MG/100ML IV EMUL
5.0000 ug/kg/min | INTRAVENOUS | Status: DC
  Administered 2016-10-13: 50 ug/kg/min via INTRAVENOUS
  Administered 2016-10-13: 25 ug/kg/min via INTRAVENOUS
  Administered 2016-10-14: 40 ug/kg/min via INTRAVENOUS
  Administered 2016-10-14 (×2): 50 ug/kg/min via INTRAVENOUS
  Administered 2016-10-14: 30 ug/kg/min via INTRAVENOUS
  Administered 2016-10-14: 50 ug/kg/min via INTRAVENOUS
  Administered 2016-10-14 (×5): 40 ug/kg/min via INTRAVENOUS
  Administered 2016-10-15: 30 ug/kg/min via INTRAVENOUS
  Administered 2016-10-15: 40 ug/kg/min via INTRAVENOUS
  Administered 2016-10-15: 30 ug/kg/min via INTRAVENOUS
  Administered 2016-10-15: 35 ug/kg/min via INTRAVENOUS
  Administered 2016-10-15: 30 ug/kg/min via INTRAVENOUS
  Filled 2016-10-13 (×3): qty 100
  Filled 2016-10-13: qty 300
  Filled 2016-10-13 (×8): qty 100
  Filled 2016-10-13: qty 200
  Filled 2016-10-13 (×2): qty 100

## 2016-10-13 MED ORDER — LORAZEPAM 2 MG/ML IJ SOLN
0.5000 mg | Freq: Once | INTRAMUSCULAR | Status: AC
  Administered 2016-10-13: 0.5 mg via INTRAVENOUS
  Filled 2016-10-13: qty 1

## 2016-10-13 MED ORDER — INFLUENZA VAC SPLIT QUAD 0.5 ML IM SUSY
0.5000 mL | PREFILLED_SYRINGE | INTRAMUSCULAR | Status: AC
  Administered 2016-10-18: 0.5 mL via INTRAMUSCULAR
  Filled 2016-10-13: qty 0.5

## 2016-10-13 MED ORDER — PROPOFOL 10 MG/ML IV BOLUS
INTRAVENOUS | Status: AC
  Filled 2016-10-13: qty 20

## 2016-10-13 MED ORDER — PROPOFOL 1000 MG/100ML IV EMUL: 5.0000 ug/kg/min | INTRAVENOUS | Status: DC

## 2016-10-13 MED ORDER — FENTANYL CITRATE (PF) 100 MCG/2ML IJ SOLN
INTRAMUSCULAR | Status: AC
  Filled 2016-10-13: qty 2

## 2016-10-13 MED ORDER — SODIUM CHLORIDE 0.9% FLUSH: 3.0000 mL | Freq: Two times a day (BID) | INTRAVENOUS | Status: DC

## 2016-10-13 MED ORDER — ENOXAPARIN SODIUM 40 MG/0.4ML ~~LOC~~ SOLN
40.0000 mg | SUBCUTANEOUS | Status: DC
  Administered 2016-10-14: 40 mg via SUBCUTANEOUS
  Filled 2016-10-13: qty 0.4

## 2016-10-13 MED ORDER — SUCCINYLCHOLINE CHLORIDE 20 MG/ML IJ SOLN
INTRAMUSCULAR | Status: AC | PRN
  Administered 2016-10-13: 100 mg via INTRAVENOUS

## 2016-10-13 MED ORDER — LEVOTHYROXINE SODIUM 50 MCG PO TABS
50.0000 ug | ORAL_TABLET | Freq: Every day | ORAL | Status: DC
  Filled 2016-10-13: qty 1

## 2016-10-13 MED ORDER — FAMOTIDINE IN NACL 20-0.9 MG/50ML-% IV SOLN
20.0000 mg | Freq: Two times a day (BID) | INTRAVENOUS | Status: DC
  Administered 2016-10-13 – 2016-10-14 (×2): 20 mg via INTRAVENOUS
  Filled 2016-10-13 (×2): qty 50

## 2016-10-13 MED ORDER — FUROSEMIDE 10 MG/ML IJ SOLN
120.0000 mg | Freq: Once | INTRAVENOUS | Status: AC
  Administered 2016-10-13: 120 mg via INTRAVENOUS
  Filled 2016-10-13: qty 12

## 2016-10-13 MED ORDER — ORAL CARE MOUTH RINSE
15.0000 mL | Freq: Four times a day (QID) | OROMUCOSAL | Status: DC
  Administered 2016-10-14 – 2016-10-24 (×43): 15 mL via OROMUCOSAL

## 2016-10-13 MED ORDER — PROPOFOL 500 MG/50ML IV EMUL
INTRAVENOUS | Status: DC | PRN
  Administered 2016-10-13: 50 ug/kg/min via INTRAVENOUS

## 2016-10-13 MED ORDER — SODIUM CHLORIDE 0.9% FLUSH
3.0000 mL | Freq: Two times a day (BID) | INTRAVENOUS | Status: DC
  Administered 2016-10-13: 10 mL via INTRAVENOUS
  Administered 2016-10-14: 3 mL via INTRAVENOUS
  Administered 2016-10-14 – 2016-10-15 (×2): 10 mL via INTRAVENOUS
  Administered 2016-10-16 – 2016-10-23 (×8): 3 mL via INTRAVENOUS

## 2016-10-13 MED ORDER — FENTANYL CITRATE (PF) 100 MCG/2ML IJ SOLN
100.0000 ug | Freq: Once | INTRAMUSCULAR | Status: AC
  Administered 2016-10-13: 100 ug via INTRAVENOUS

## 2016-10-13 MED ORDER — FUROSEMIDE 10 MG/ML IJ SOLN
80.0000 mg | Freq: Two times a day (BID) | INTRAMUSCULAR | Status: DC
  Administered 2016-10-13 – 2016-10-15 (×4): 80 mg via INTRAVENOUS
  Filled 2016-10-13 (×5): qty 8

## 2016-10-13 MED ORDER — ACETAMINOPHEN 325 MG PO TABS: 650.0000 mg | ORAL_TABLET | Freq: Four times a day (QID) | ORAL | Status: DC | PRN

## 2016-10-13 MED ORDER — INSULIN ASPART 100 UNIT/ML ~~LOC~~ SOLN
0.0000 [IU] | SUBCUTANEOUS | Status: DC
  Administered 2016-10-14: 7 [IU] via SUBCUTANEOUS
  Administered 2016-10-14: 2 [IU] via SUBCUTANEOUS
  Administered 2016-10-14: 3 [IU] via SUBCUTANEOUS
  Administered 2016-10-14: 5 [IU] via SUBCUTANEOUS
  Administered 2016-10-14: 2 [IU] via SUBCUTANEOUS
  Administered 2016-10-14: 3 [IU] via SUBCUTANEOUS
  Administered 2016-10-15 (×3): 7 [IU] via SUBCUTANEOUS

## 2016-10-13 MED ORDER — ETOMIDATE 2 MG/ML IV SOLN
INTRAVENOUS | Status: AC | PRN
  Administered 2016-10-13: 20 mg via INTRAVENOUS

## 2016-10-13 MED ORDER — ONDANSETRON HCL 4 MG PO TABS: 4.0000 mg | ORAL_TABLET | Freq: Four times a day (QID) | ORAL | Status: DC | PRN

## 2016-10-13 MED ORDER — ALBUTEROL SULFATE (2.5 MG/3ML) 0.083% IN NEBU: 2.5000 mg | INHALATION_SOLUTION | RESPIRATORY_TRACT | Status: DC | PRN

## 2016-10-13 MED ORDER — IPRATROPIUM-ALBUTEROL 0.5-2.5 (3) MG/3ML IN SOLN
3.0000 mL | Freq: Four times a day (QID) | RESPIRATORY_TRACT | Status: DC
  Administered 2016-10-14 (×2): 3 mL via RESPIRATORY_TRACT
  Filled 2016-10-13 (×3): qty 3

## 2016-10-13 MED ORDER — CHLORHEXIDINE GLUCONATE 0.12% ORAL RINSE (MEDLINE KIT)
15.0000 mL | Freq: Two times a day (BID) | OROMUCOSAL | Status: DC
  Administered 2016-10-13 – 2016-10-24 (×22): 15 mL via OROMUCOSAL

## 2016-10-13 MED ORDER — IPRATROPIUM-ALBUTEROL 0.5-2.5 (3) MG/3ML IN SOLN
3.0000 mL | Freq: Four times a day (QID) | RESPIRATORY_TRACT | Status: DC
Start: 2016-10-13 — End: 2016-10-13

## 2016-10-13 MED ORDER — ACETAMINOPHEN 650 MG RE SUPP: 650.0000 mg | Freq: Four times a day (QID) | RECTAL | Status: DC | PRN

## 2016-10-13 MED ORDER — ONDANSETRON HCL 4 MG/2ML IJ SOLN
4.0000 mg | Freq: Four times a day (QID) | INTRAMUSCULAR | Status: DC | PRN
Start: 2016-10-13 — End: 2016-10-24

## 2016-10-13 MED ORDER — SODIUM CHLORIDE 0.9 % IV SOLN: 250.0000 mL | INTRAVENOUS | Status: DC | PRN

## 2016-10-13 MED ORDER — SODIUM CHLORIDE 0.9% FLUSH: 3.0000 mL | INTRAVENOUS | Status: DC | PRN

## 2016-10-13 NOTE — ED Notes (Signed)
Bolus 30 mg Propofol.

## 2016-10-13 NOTE — ED Provider Notes (Signed)
Critical care came to see patient and determined that pt needs to be intubated as minute ventilations are low and mental status is poor.  INTUBATION Performed by: Jacalyn Lefevre  Required items: required blood products, implants, devices, and special equipment available Patient identity confirmed: provided demographic data and hospital-assigned identification number Time out: Immediately prior to procedure a "time out" was called to verify the correct patient, procedure, equipment, support staff and site/side marked as required.  Indications:  Low minute volumes, decreased mental status  Intubation method: Glidescope Laryngoscopy   Preoxygenation: BVM  Sedatives: 20 mg Etomidate Paralytic: 100 mg Succinylcholine  Tube Size:  8.0 cuffed  Post-procedure assessment: chest rise and ETCO2 monitor Breath sounds: equal and absent over the epigastrium Tube secured with: ETT holder Chest x-ray interpreted by radiologist and me.  Chest x-ray findings: endotracheal tube in appropriate position NG is in esophagus (nursing told to advance)  Patient tolerated the procedure well with no immediate complications.     Jacalyn Lefevre, MD 10/16/16 1537

## 2016-10-13 NOTE — ED Provider Notes (Signed)
[facility] DEPT Provider Note   CSN: [id number] Arrival date & time: 10/06/2016  1148     History   Chief Complaint Chief Complaint  Patient presents with  . Shortness of Breath  . Weakness    HPI Antonio [name] is a 65 y.o. male.  HPI  Presents with concern for altered mental status. Daughter reports that a few nights ago he had an episode where he was confused, and his nasal cannula was found to be off his face, and when they returned they seemed to improve back to baseline. Reports that this morning when the home health aide was there he appeared similar, with confusion, thinking she was in [place], and asking repetitive questions. Reports he seemed sleepy, just kind of out of it and confused. Symptoms started slowly last night, however were much worse this morning. Denies any focal neurologic deficit, did not notice facial drooping, numbness or weakness on one side. Daughter reports this morning is similar to when he has been hypoxic in the past. On EMS arrival, patient was satting 78% on 5 L of oxygen, however improved to the 90s on 6 L in transport.  On arrival to the emergency department, patient was initially awake, joking with staff, however on my evaluation history is limited from him given patient with confusion, Initially appearing joke, however then not making sense, talking about something else, sleepy.  No new meds, no med withdrawal, no hx of ETOH use.  Past Medical History:  Diagnosis Date  . Anxiety   . Aortic stenosis    a. s/p AVR 11/29/11  . Arthritis   . Blindness of right eye    a. due to amblyopia as child  . Chronic diastolic heart failure ([place])    a. Echo 08/11/2015 LVEF 55-60% with normal RV function  . DM2 (diabetes mellitus, type 2) ([place])   . Gout   . Hepatitis   . History of acute renal failure   . HLD (hyperlipidemia)   . HTN (hypertension)   . Hx MRSA infection    a. thigh abcess 2006  . OSA on CPAP    moderate with AHI 26/hr     Patient Active Problem List   Diagnosis Date Noted  . Encephalopathy 10/14/2016  . Respiratory failure with hypercapnia ([place]) 10/02/2016  . Pressure ulcer 06/11/2016  . CHF (congestive heart failure) ([place]) 06/01/2016  . CKD (chronic kidney disease), stage III 01/20/2016  . Obesity hypoventilation syndrome ([place])   . Insulin dependent diabetes mellitus ([place])   . OSA on CPAP   . Morbid obesity ([place])   . Respiratory failure ([place]) 01/01/2016  . Shortness of breath   . Wound of right leg 11/03/2015  . Chronic diastolic CHF (congestive heart failure) ([place]) 09/08/2015  . S/P aortic valve replacement with bioprosthetic valve 02/22/2015  . PAF (paroxysmal atrial fibrillation) ([place]) 12/08/2011  . ABNORMAL CV (STRESS) TEST 11/15/2010  . Aortic stenosis 09/26/2010  . Hyperlipidemia 12/26/2006  . Gout 12/26/2006  . Obesity 12/26/2006  . HYPERTENSION, BENIGN SYSTEMIC 12/26/2006  . EDEMA-LEGS,DUE TO VENOUS OBSTRUCT. 12/26/2006  . OSTEOARTHRITIS OF SPINE, NOS 12/26/2006    Past Surgical History:  Procedure Laterality Date  . AORTIC VALVE REPLACEMENT  11/29/2011   Procedure: AORTIC VALVE REPLACEMENT (AVR);  Surgeon: Tharon Aquas Adelene Idler, MD;  Location: [facility];  Service: Open Heart Surgery;  Laterality: N/A;  with nitric oxide  . BACK SURGERY    . CARDIAC CATHETERIZATION    . CARDIAC CATHETERIZATION N/A 10/14/2015  Procedure: Right Heart Cath;  Surgeon: Larey Dresser, MD;  Location: [facility] CV LAB;  Service: Cardiovascular;  Laterality: N/A;  . CARDIAC VALVE REPLACEMENT    . INCISION AND DRAINAGE ABSCESS Left 08/2005   "upper/inner thigh; that's where the MRSA was"  . LUMBAR LAMINECTOMY  10/30/1983  . RIGHT HEART CATHETERIZATION N/A 11/28/2011   Procedure: RIGHT HEART CATH;  Surgeon: Burnell Blanks, MD;  Location[facility] CATH LAB;  Service: Cardiovascular;  Laterality: N/A;       Home Medications    Prior to Admission medications   Medication Sig Start Date End Date Taking?  Authorizing Provider  allopurinol (ZYLOPRIM) 100 MG tablet Take 100 mg by mouth daily.   Yes Historical Provider, MD  aspirin 81 MG tablet Take 81 mg by mouth daily.   Yes Historical Provider, MD  atorvastatin (LIPITOR) 40 MG tablet Take 40 mg by mouth daily at 6 PM.  12/05/14  Yes Historical Provider, MD  BAYER MICROLET LANCETS lancets As directed   Yes Historical Provider, MD  BD PEN NEEDLE NANO U/F 32G X 4 MM MISC As directed 04/09/16  Yes Historical Provider, MD  carvedilol (COREG) 3.125 MG tablet Take 1 tablet (3.125 mg total) by mouth 2 (two) times daily. 09/26/16 12/25/16 Yes Shirley Friar, PA-C  Coenzyme Q10 (CO Q-10 PO) Take 2 capsules by mouth daily.   Yes Historical Provider, MD  colchicine 0.6 MG tablet Take 1 tablet (0.6 mg total) by mouth daily. 06/01/16  Yes Erma Heritage, PA  febuxostat (ULORIC) 40 MG tablet Take 40 mg by mouth daily.    Yes Historical Provider, MD  fenofibrate 54 MG tablet Take 54 mg by mouth daily. 05/04/15  Yes Historical Provider, MD  fluticasone (FLONASE) 50 MCG/ACT nasal spray Place 1 spray into both nostrils daily. 01/09/16  Yes Florencia Reasons, MD  glucose blood (BAYER CONTOUR NEXT TEST) test strip As directed 05/29/16  Yes Historical Provider, MD  HYDROcodone-acetaminophen (NORCO) 7.5-325 MG tablet Take 1 tablet by mouth every 4 (four) hours as needed for moderate pain. 06/11/16  Yes Domenic Polite, MD  insulin aspart (NOVOLOG) 100 UNIT/ML injection Inject into the skin 3 (three) times daily before meals. Per sliding scale   Yes Historical Provider, MD  Insulin Glargine (BASAGLAR KWIKPEN [place]) Inject 50 Units into the skin 2 (two) times daily.    Yes Historical Provider, MD  metolazone (ZAROXOLYN) 2.5 MG tablet Take 1 tablet (2.5 mg total) by mouth as needed (for weight gain, 3lbs overnight and 5lbs in 1 week, take extra KCL 20MeQ with this). 09/26/16  Yes Shirley Friar, PA-C  Omega-3 Fatty Acids (FISH OIL) 1200 MG CAPS Take 1,200 mg by mouth daily.   Yes  Historical Provider, MD  potassium chloride SA (K-DUR,KLOR-CON) 20 MEQ tablet Take 40 mEq by mouth 2 (two) times daily. Take an additional 20 meq (1 tablet) if take metolazone    Yes Historical Provider, MD  Saccharomyces boulardii (PROBIOTIC) 250 MG CAPS Take 250 mg by mouth 2 (two) times daily.   Yes Historical Provider, MD  vitamin C (ASCORBIC ACID) 500 MG tablet Take 500 mg by mouth daily.   Yes Historical Provider, MD  amoxicillin (AMOXIL) 500 MG tablet Take 4 tablets (2,000 mg total) by mouth as needed (1 hour prior to dental work). 09/26/16   Shirley Friar, PA-C  levothyroxine (SYNTHROID, LEVOTHROID) 50 MCG tablet Take 50 mcg by mouth daily before breakfast. 08/01/16   Historical Provider, MD  OXYGEN Inhale  4 L into the lungs at bedtime.    Historical Provider, MD  torsemide (DEMADEX) 20 MG tablet Take 4 tablets (80 mg total) by mouth 2 (two) times daily. 05/02/16   Larey Dresser, MD    Family History Family History  Problem Relation Age of Onset  . Cancer Mother   . Heart attack Father     Social History Social History  Substance Use Topics  . Smoking status: Never Smoker  . Smokeless tobacco: Never Used  . Alcohol use Yes     Comment: 02/18/2015 "once or twice/yr I'll have a cocktail"      rare     Allergies   Patient has no known allergies.   Review of Systems Review of Systems  Constitutional: Positive for fatigue. Negative for fever.  HENT: Negative for sore throat.   Eyes: Negative for visual disturbance.  Respiratory: Positive for shortness of breath. Negative for cough.   Cardiovascular: Negative for chest pain.  Gastrointestinal: Negative for abdominal pain, nausea and vomiting.  Genitourinary: Negative for difficulty urinating.  Musculoskeletal: Negative for neck stiffness.  Skin: Negative for rash.  Neurological: Negative for syncope and headaches.  Psychiatric/Behavioral: Positive for confusion.     Physical Exam Updated Vital Signs BP (!)  131/54   Pulse 81   Temp 98.6 F (37 C) (Oral)   Resp (!) 22   Ht '5\' 11"'$  (1.803 m)   Wt (!) 475 lb (215.5 kg)   SpO2 96%   BMI 66.25 kg/m   Physical Exam  Constitutional: He appears well-developed and well-nourished. He has a sickly appearance. He appears ill. No distress.  Morbid obesity  HENT:  Head: Normocephalic and atraumatic.  Eyes: Conjunctivae and EOM are normal.  Neck: Normal range of motion.  Cardiovascular: Normal rate, regular rhythm and normal heart sounds.  Exam reveals no gallop and no friction rub.   No murmur heard. Doppler signals bilat LE  Pulmonary/Chest: Effort normal and breath sounds normal. No respiratory distress. He has no wheezes. He has no rales.  Diminished BS, exam limited by body habitus   Abdominal: Soft. He exhibits no distension. There is no tenderness. There is no guarding.  Musculoskeletal: He exhibits edema (3+ bilateral, chronic venous stasis changes, right foot more erythema (chronic per daughter)).  Neurological: No cranial nerve deficit or sensory deficit. Coordination normal.  For nursing, oriented x3, for me moments later at time of triage oriented x2, starts joking about something else when I ask him date Pt sleepy, however easily arousable  Skin: Skin is warm and dry. He is not diaphoretic.  Nursing note and vitals reviewed.    ED Treatments / Results  Labs (all labs ordered are listed, but only abnormal results are displayed) Labs Reviewed  URINALYSIS, ROUTINE W REFLEX MICROSCOPIC - Abnormal; Notable for the following:       Result Value   Glucose, UA 50 (*)    Protein, ur 100 (*)    Bacteria, UA RARE (*)    All other components within normal limits  COMPREHENSIVE METABOLIC PANEL - Abnormal; Notable for the following:    Chloride 89 (*)    CO2 47 (*)    Glucose, Bld 207 (*)    BUN 27 (*)    Albumin 3.1 (*)    All other components within normal limits  CBC WITH DIFFERENTIAL/PLATELET - Abnormal; Notable for the following:     Hemoglobin 11.7 (*)    MCHC 29.0 (*)    All other components  within normal limits  AMMONIA - Abnormal; Notable for the following:    Ammonia 41 (*)    All other components within normal limits  BLOOD GAS, ARTERIAL - Abnormal; Notable for the following:    pH, Arterial 7.327 (*)    pCO2 arterial 96.4 (*)    pO2, Arterial 76.7 (*)    Bicarbonate 49.0 (*)    Acid-Base Excess 21.9 (*)    All other components within normal limits  GLUCOSE, CAPILLARY - Abnormal; Notable for the following:    Glucose-Capillary 150 (*)    All other components within normal limits  CBG MONITORING, ED - Abnormal; Notable for the following:    Glucose-Capillary 212 (*)    All other components within normal limits  CBG MONITORING, ED - Abnormal; Notable for the following:    Glucose-Capillary 161 (*)    All other components within normal limits  I-STAT VENOUS BLOOD GAS, ED - Abnormal; Notable for the following:    pCO2, Ven 81.0 (*)    pO2, Ven 30.0 (*)    Bicarbonate 53.1 (*)    Acid-Base Excess 24.0 (*)    All other components within normal limits  MRSA PCR SCREENING  MAGNESIUM  BRAIN NATRIURETIC PEPTIDE  BASIC METABOLIC PANEL  CBC  TRIGLYCERIDES  I-STAT CG4 LACTIC ACID, ED  I-STAT TROPOININ, ED  I-STAT CG4 LACTIC ACID, ED    EKG  EKG Interpretation  Date/Time:  Saturday October 13 2016 12:10:29 EST Ventricular Rate:  95 PR Interval:    QRS Duration: 158 QT Interval:  385 QTC Calculation: 484 R Axis:   144 Text Interpretation:  Sinus rhythm Borderline prolonged PR interval RBBB and LPFB No significant change since last tracing Confirmed by [facility] MD, Samil Mecham ([id number]) on 09/30/2016 1:31:42 PM       Radiology Dg Chest 2 View  Result Date: 10/15/2016 CLINICAL DATA:  Increased lethargy. EXAM: CHEST  2 VIEW COMPARISON:  06/01/2016 FINDINGS: Post median sternotomy changes and valve replacement, stable. The cardiac silhouette is enlarged. Mediastinal contours appear intact. Diffusely  present interstitial and alveolar opacities. Possible left pleural effusion. Osseous structures are without acute abnormality. Soft tissues are grossly normal. IMPRESSION: Enlarged cardiac silhouette. Interstitial and alveolar opacities which may be seen in mixed pattern pulmonary edema. Probable left pleural effusion. Electronically Signed   By: Fidela Salisbury M.D.   On: 10/02/2016 16:09   Ct Head Wo Contrast  Result Date: 09/28/2016 CLINICAL DATA:  Altered mental status.  Severe shortness of breath. EXAM: CT HEAD WITHOUT CONTRAST TECHNIQUE: Contiguous axial images were obtained from the base of the skull through the vertex without intravenous contrast. COMPARISON:  None. FINDINGS: Brain: Old right caudate head lacunar infarct. Otherwise, normal appearing cerebral hemispheres and posterior fossa structures. Normal size and position of the ventricles. No intracranial hemorrhage, mass lesion or CT evidence of acute infarction. Vascular: Bilateral vertebral and cavernous internal carotid artery atheromatous calcifications. Skull: Normal. Negative for fracture or focal lesion. Sinuses/Orbits: No acute finding. Other: None. IMPRESSION: 1. No acute abnormality. 2. Old right caudate head lacunar infarct. 3. Atheromatous arterial calcifications at the skull base. Electronically Signed   By: Claudie Revering M.D.   On: 10/09/2016 16:27   Dg Chest Portable 1 View  Result Date: 10/07/2016 CLINICAL DATA:  65 y/o  M; post intubation. EXAM: PORTABLE CHEST 1 VIEW COMPARISON:  10/21/2016 chest radiograph FINDINGS: Endotracheal tube tip approximately 2.6 cm from carina. Unchanged diffuse airspace opacities and low lung volumes. Probable small effusions. Post median sternotomy. Enteric  tube tip ends in the distal esophagus, advancement is recommended. IMPRESSION: Endotracheal tube 2.6 cm from carina. Enteric tube tip ends in distal esophagus, advancement recommended. Stable diffuse airspace opacities and low lung volumes.  These results were called by telephone at the time of interpretation on 10/26/2016 at 8:44 pm to Dr. Isla Pence , who verbally acknowledged these results. Electronically Signed   By: Kristine Garbe M.D.   On: 10/15/2016 20:46    Procedures Procedures (including critical care time)  Medications Ordered in ED Medications  ondansetron (ZOFRAN) tablet 4 mg (not administered)    Or  ondansetron (ZOFRAN) injection 4 mg (not administered)  albuterol (PROVENTIL) (2.5 MG/3ML) 0.083% nebulizer solution 2.5 mg (not administered)  enoxaparin (LOVENOX) injection 40 mg (not administered)  sodium chloride flush (NS) 0.9 % injection 3 mL (not administered)  sodium chloride flush (NS) 0.9 % injection 3 mL (not administered)  sodium chloride flush (NS) 0.9 % injection 3 mL (not administered)  0.9 %  sodium chloride infusion (not administered)  acetaminophen (TYLENOL) tablet 650 mg (not administered)    Or  acetaminophen (TYLENOL) suppository 650 mg (not administered)  furosemide (LASIX) injection 80 mg (not administered)  ipratropium-albuterol (DUONEB) 0.5-2.5 (3) MG/3ML nebulizer solution 3 mL (not administered)  hydrALAZINE (APRESOLINE) injection 10 mg (not administered)  levothyroxine (SYNTHROID, LEVOTHROID) tablet 50 mcg (not administered)  0.9 %  sodium chloride infusion (not administered)  propofol (DIPRIVAN) 1000 MG/100ML infusion (50 mcg/kg/min  179.2 kg Intravenous New Bag/Given 10/03/2016 2200)  propofol (DIPRIVAN) 10 mg/mL bolus/IV push (not administered)  insulin aspart (novoLOG) injection 0-9 Units (not administered)  chlorhexidine gluconate (MEDLINE KIT) (PERIDEX) 0.12 % solution 15 mL (15 mLs Mouth Rinse Given 10/15/2016 2100)  MEDLINE mouth rinse (not administered)  fentaNYL (SUBLIMAZE) 100 MCG/2ML injection (not administered)  Influenza vac split quadrivalent PF (FLUARIX) injection 0.5 mL (not administered)  famotidine (PEPCID) IVPB 20 mg premix (not administered)    furosemide (LASIX) 120 mg in dextrose 5 % 50 mL IVPB (0 mg Intravenous Stopped 10/11/2016 1923)  LORazepam (ATIVAN) injection 0.5 mg (0.5 mg Intravenous Given 10/18/2016 1820)  etomidate (AMIDATE) injection (20 mg Intravenous Given 10/26/2016 2000)  succinylcholine (ANECTINE) injection (100 mg Intravenous Given 10/27/2016 2001)  fentaNYL (SUBLIMAZE) injection 100 mcg (100 mcg Intravenous Given 10/15/2016 2053)     Initial Impression / Assessment and Plan / ED Course  I have reviewed the triage vital signs and the nursing notes.  Pertinent labs & imaging results that were available during my care of the patient were reviewed by me and considered in my medical decision making (see chart for details). CRITICAL CARE:ams Performed by: Alvino Chapel   Total critical care time: 30 minutes  Critical care time was exclusive of separately billable procedures and treating other patients.  Critical care was necessary to treat or prevent imminent or life-threatening deterioration.  Critical care was time spent personally by me on the following activities: development of treatment plan with patient and/or surrogate as well as nursing, discussions with consultants, evaluation of patient's response to treatment, examination of patient, obtaining history from patient or surrogate, ordering and performing treatments and interventions, ordering and review of laboratory studies, ordering and review of radiographic studies, pulse oximetry and re-evaluation of patient's condition.  Clinical Course    65 year old male with a history of chronic diastolic heart failure, aortic stenosis, hypertension, hyperlipidemia, diabetes, obesity with obesity hypoventilation syndrome, and obstructive sleep apnea on 5L of O2 at home presents with concern for confusion, and report  of hypoxia at home on 5L to 78% on room air.  DDx includes infection, hypercapnic encephalopathy, hypoxic encephalopathy, CHF exacerbation, PE,  hepatic encephalopathy. No medication changes to suggest withdrawal or new med side effect.   Urinalysis shows no sign of UTI. Chest x-ray is asymmetric, however patient without leukocytosis, no cough, afebrile, and have low suspicion clinically for pneumonia.  Patient has erythema of the right lower extremity which he and his daugther state is chronic.  Lactic acid within normal limits.  Patient with bilateral lower extremity edema, however daughter reports is not increased from prior. Exam is otherwise limited by body habitus for evaluating for clinical signs of heart failure. BNP is in the 70s, however suspect this is an underestimate given his body habitus. Chest x-ray is more consistent with pulmonary edema. Weight difficult to assess in stretcher, however suspect it is elevated and daughter reports he has not checked it since he was in office on 12/13.   Pt O2 saturation in low 90s on 6L. Pt given 127m IV lasix.   Blood gas shows chronic compensated hypercarbia.  Mental status concerning for hypercapneic encephalopathy, possible hypoxic encephalopathy in setting of CHF. Symptoms waxing and waning while in ED. Ammonia also ordered and pending.  Doubt CVA given no true aphasia, no other neurologic abnormalities on exam or by hx.   Will admit for combined acute on chronic hypoxic and hypercapnic respiratory failure, likely secondary to CHF and obesity hypoventilation syndrome. Admit to stepdown for further care given high baseline O2 requirements, AMS, BiPAP.    Final Clinical Impressions(s) / ED Diagnoses   Final diagnoses:  Acute on chronic respiratory failure with hypoxia and [place]capnia ([place]  Encephalopathy    New Prescriptions Current Discharge Medication List       EGareth Morgan MD 10/04/2016 2303

## 2016-10-13 NOTE — ED Notes (Signed)
Pt sat 85%; RT notified.

## 2016-10-13 NOTE — Progress Notes (Signed)
   2016/11/06 2100  Clinical Encounter Type  Visited With Family  Visit Type ED  Referral From Nurse  Consult/Referral To Chaplain  Spiritual Encounters  Spiritual Needs Emotional  Stress Factors  Patient Stress Factors Major life changes  CHP provided support to patient's daughter. Rodney Booze 06-Nov-2016

## 2016-10-13 NOTE — Progress Notes (Signed)
ABG-PCO 96-pH 7.3 More awake than before-but lethargic and confused.  Continue BiPAP-d/w PCCM-Dr Wright-recommendations are to continue with current treatment-PCCM will follow and officially consult

## 2016-10-13 NOTE — ED Notes (Signed)
Patient transported to X-ray 

## 2016-10-13 NOTE — ED Notes (Signed)
Pt reaching to pull out ETT. Bolus dose to be given

## 2016-10-13 NOTE — ED Notes (Signed)
Attempted to call report

## 2016-10-13 NOTE — ED Notes (Signed)
Pt belongings with daughter. 

## 2016-10-13 NOTE — ED Notes (Signed)
Critical care at bedside; BP rechecked 116/80. MD request NS bolus, and propofol changed to 15 mcg/min.

## 2016-10-13 NOTE — Progress Notes (Signed)
o2 will be wean as tolerated. Pt is on 100% at this time. RN at bedside trying to get patient settled in.

## 2016-10-13 NOTE — H&P (Signed)
PULMONARY / CRITICAL CARE MEDICINE   Name: Antonio SmothersJoseph G Celi [name] MRN: [id number] DOB: 09/15/1951    ADMISSION DATE:  11-10-15  CHIEF COMPLAINT:  Dyspnea, confusion  HISTORY OF PRESENT ILLNESS:   [name] is a 65 y/o man with a history of CAD and AS (s/p CABG and AVR in 2012) as well as diastolic dysfunction, morbid obesity, DM2, HTN, and OSA who presented to the ED with confusion and dyspnea. His daughter reports that he has been on ~5L [place] O2 at home (it sounds like to CHF as well as possible obesity-related atelectasis, there is no history of chronic lung disease). On Thursday, the patient called his daughter saying he wasn't feeling well, and she found him with his nasal cannula displaced, cyanotic, and confused. EMS was called, and his oxygen restored, and the patient returned to his baseline status and did not present to the ED. He was well on Friday, but Saturday morning was confused and was brought to the hospital. His PCO2 was found to be 81 (pH 7.42), and he was treated with BiPAP. He improved somewhat with BiPAP, but become slightly agitated. He was given 0.5 of ativan, and his minute ventilation declined significantly. He was intubated.  PAST MEDICAL HISTORY :  He  has a past medical history of Anxiety; Aortic stenosis; Arthritis; Blindness of right eye; Chronic diastolic heart failure (HCC); DM2 (diabetes mellitus, type 2) (HCC); Gout; Hepatitis; History of acute renal failure; HLD (hyperlipidemia); HTN (hypertension); MRSA infection; and OSA on CPAP.  PAST SURGICAL HISTORY: He  has a past surgical history that includes Lumbar laminectomy (10/30/1983); Aortic valve replacement (11/29/2011); right heart catheterization (N/A, 11/28/2011); Cardiac valve replacement; Incision and drainage abscess (Left, 08/2005); Back surgery; Cardiac catheterization; and Cardiac catheterization (N/A, 10/14/2015).  No Known Allergies  No current facility-administered medications on file prior to encounter.     Current Outpatient Prescriptions on File Prior to Encounter  Medication Sig  . allopurinol (ZYLOPRIM) 100 MG tablet Take 100 mg by mouth daily.  Marland Kitchen. aspirin 81 MG tablet Take 81 mg by mouth daily.  Marland Kitchen. atorvastatin (LIPITOR) 40 MG tablet Take 40 mg by mouth daily at 6 PM.   . BAYER MICROLET LANCETS lancets As directed  . BD PEN NEEDLE NANO U/F 32G X 4 MM MISC As directed  . carvedilol (COREG) 3.125 MG tablet Take 1 tablet (3.125 mg total) by mouth 2 (two) times daily.  . Coenzyme Q10 (CO Q-10 PO) Take 2 capsules by mouth daily.  . colchicine 0.6 MG tablet Take 1 tablet (0.6 mg total) by mouth daily.  . febuxostat (ULORIC) 40 MG tablet Take 40 mg by mouth daily.   . fenofibrate 54 MG tablet Take 54 mg by mouth daily.  . fluticasone (FLONASE) 50 MCG/ACT nasal spray Place 1 spray into both nostrils daily.  Marland Kitchen. glucose blood (BAYER CONTOUR NEXT TEST) test strip As directed  . HYDROcodone-acetaminophen (NORCO) 7.5-325 MG tablet Take 1 tablet by mouth every 4 (four) hours as needed for moderate pain.  Marland Kitchen. insulin aspart (NOVOLOG) 100 UNIT/ML injection Inject into the skin 3 (three) times daily before meals. Per sliding scale  . Insulin Glargine (BASAGLAR KWIKPEN [place]) Inject 50 Units into the skin 2 (two) times daily.   . metolazone (ZAROXOLYN) 2.5 MG tablet Take 1 tablet (2.5 mg total) by mouth as needed (for weight gain, 3lbs overnight and 5lbs in 1 week, take extra KCL 20MeQ with this).  . Omega-3 Fatty Acids (FISH OIL) 1200 MG CAPS Take  1,200 mg by mouth daily.  . potassium chloride SA (K-DUR,KLOR-CON) 20 MEQ tablet Take 40 mEq by mouth 2 (two) times daily. Take an additional 20 meq (1 tablet) if take metolazone   . Saccharomyces boulardii (PROBIOTIC) 250 MG CAPS Take 250 mg by mouth 2 (two) times daily.  . vitamin C (ASCORBIC ACID) 500 MG tablet Take 500 mg by mouth daily.  Marland Kitchen amoxicillin (AMOXIL) 500 MG tablet Take 4 tablets (2,000 mg total) by mouth as needed (1 hour prior to dental work).  Marland Kitchen  levothyroxine (SYNTHROID, LEVOTHROID) 50 MCG tablet Take 50 mcg by mouth daily before breakfast.  . OXYGEN Inhale 4 L into the lungs at bedtime.  . torsemide (DEMADEX) 20 MG tablet Take 4 tablets (80 mg total) by mouth 2 (two) times daily.    FAMILY HISTORY:  His indicated that his mother is deceased. He indicated that his father is deceased.    SOCIAL HISTORY: He  reports that he has never smoked. He has never used smokeless tobacco. He reports that he drinks alcohol. He reports that he does not use drugs.   VITAL SIGNS: BP (!) 131/54   Pulse 81   Temp 98.6 F (37 C) (Oral)   Resp (!) 22   Ht 5\' 11"  (1.803 m)   Wt (!) 475 lb (215.5 kg)   SpO2 96%   BMI 66.25 kg/m   HEMODYNAMICS:    VENTILATOR SETTINGS: Vent Mode: PRVC FiO2 (%):  [70 %-100 %] 100 % Set Rate:  [16 bmp] 16 bmp Vt Set:  [600 mL] 600 mL PEEP:  [5 cmH20] 5 cmH20 Plateau Pressure:  [29 cmH20] 29 cmH20  INTAKE / OUTPUT: I/O last 3 completed shifts: In: -  Out: 450 [Urine:450]  PHYSICAL EXAMINATION: General:  Obese man, intubated Neuro:  (prior to intubation), obtunded, responds to noxious stimulous, but unable to follow commands or answer questions. HEENT:  MMM Cardiovascular:  Distended veins Lungs:  Crackles bilaterally Abdomen:  Obese but soft Musculoskeletal:  No deformities  Skin:  No rashes on visible skin  LABS:  BMET  Recent Labs Lab 10/10/16 1228 10/15/2016 1247  NA 141 143  K 4.6 4.8  CL 87* 89*  CO2 42* 47*  BUN 31* 27*  CREATININE 1.14 1.03  GLUCOSE 203* 207*    Electrolytes  Recent Labs Lab 10/10/16 1228 10/12/2016 1247  CALCIUM 9.4 9.0  MG  --  2.1    CBC  Recent Labs Lab 10/10/16 1228 10/20/2016 1247  WBC 11.3* 9.9  HGB 12.1* 11.7*  HCT 40.5 40.4  PLT 263 281    Coag's No results for input(s): APTT, INR in the last 168 hours.  Sepsis Markers  Recent Labs Lab 10/01/2016 1300 10/15/2016 1631  LATICACIDVEN 1.86 0.69    ABG  Recent Labs Lab  09/30/2016 1820  PHART 7.327*  PCO2ART 96.4*  PO2ART 76.7*    Liver Enzymes  Recent Labs Lab 10/09/2016 1247  AST 18  ALT 18  ALKPHOS 78  BILITOT 0.6  ALBUMIN 3.1*    Cardiac Enzymes No results for input(s): TROPONINI, PROBNP in the last 168 hours.  Glucose  Recent Labs Lab 10/12/2016 1209 10/18/2016 1654 10/04/2016 2129  GLUCAP 212* 161* 150*    Imaging Dg Chest 2 View  Result Date: 10/27/2016 CLINICAL DATA:  Increased lethargy. EXAM: CHEST  2 VIEW COMPARISON:  06/01/2016 FINDINGS: Post median sternotomy changes and valve replacement, stable. The cardiac silhouette is enlarged. Mediastinal contours appear intact. Diffusely present interstitial and alveolar opacities.  Possible left pleural effusion. Osseous structures are without acute abnormality. Soft tissues are grossly normal. IMPRESSION: Enlarged cardiac silhouette. Interstitial and alveolar opacities which may be seen in mixed pattern pulmonary edema. Probable left pleural effusion. Electronically Signed   By: Ted Mcalpine M.D.   On: 10/01/2016 16:09   Ct Head Wo Contrast  Result Date: 10/23/2016 CLINICAL DATA:  Altered mental status.  Severe shortness of breath. EXAM: CT HEAD WITHOUT CONTRAST TECHNIQUE: Contiguous axial images were obtained from the base of the skull through the vertex without intravenous contrast. COMPARISON:  None. FINDINGS: Brain: Old right caudate head lacunar infarct. Otherwise, normal appearing cerebral hemispheres and posterior fossa structures. Normal size and position of the ventricles. No intracranial hemorrhage, mass lesion or CT evidence of acute infarction. Vascular: Bilateral vertebral and cavernous internal carotid artery atheromatous calcifications. Skull: Normal. Negative for fracture or focal lesion. Sinuses/Orbits: No acute finding. Other: None. IMPRESSION: 1. No acute abnormality. 2. Old right caudate head lacunar infarct. 3. Atheromatous arterial calcifications at the skull base.  Electronically Signed   By: Beckie Salts M.D.   On: 10/01/2016 16:27   Dg Chest Portable 1 View  Result Date: 10/01/2016 CLINICAL DATA:  65 y/o  M; post intubation. EXAM: PORTABLE CHEST 1 VIEW COMPARISON:  10/08/2016 chest radiograph FINDINGS: Endotracheal tube tip approximately 2.6 cm from carina. Unchanged diffuse airspace opacities and low lung volumes. Probable small effusions. Post median sternotomy. Enteric tube tip ends in the distal esophagus, advancement is recommended. IMPRESSION: Endotracheal tube 2.6 cm from carina. Enteric tube tip ends in distal esophagus, advancement recommended. Stable diffuse airspace opacities and low lung volumes. These results were called by telephone at the time of interpretation on 10/02/2016 at 8:44 pm to Dr. Jacalyn Lefevre , who verbally acknowledged these results. Electronically Signed   By: Mitzi Hansen M.D.   On: 10/21/2016 20:46   STUDIES:  Head CT - no acute findings  CULTURES: None  ANTIBIOTICS: None  SIGNIFICANT EVENTS: Intubated in the ED after failing BiPAP  LINES/TUBES: ETT >> 12/16 PIV x2 OGT Foley >> 12/16  DISCUSSION: 65 y/o man with obesity hypoventilation syndrome presenting with decompensated respiratory failure.  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 due to volume overload and obesity hypoventilation P:   Full Vent Support Diurese heavily (lasix 60 mg IV + metolazone)  CARDIOVASCULAR A:  CAD Diastolic Dysfunction Hx of AVR (bioprostetic) P:  Diurese as above  RENAL A:   Compensation of acute and subacute hypercarbia P:   Will monitor electrolytes with diruesis  GASTROINTESTINAL A:   No active issues P:    HEMATOLOGIC A:   No active issues P:   INFECTIOUS A:   No active issues P:    ENDOCRINE A:   Diabetes (DM2) P:   SSI  NEUROLOGIC A:   Encephalopathy due to Hypercarbia P:   RASS goal: 0 Sedate with propofol,  SBT in AM   FAMILY  - Updates: Updated on admission  - Inter-disciplinary family meet or Palliative Care meeting due by:  10/20/16  CRITICAL CARE Performed by: Jamie Kato   Total critical care time: 60 minutes  Critical care time was exclusive of separately billable procedures and treating other patients.  Critical care was necessary to treat or prevent imminent or life-threatening deterioration.  Critical care was time spent personally by me on the following activities: development of treatment plan with patient and/or surrogate as well as nursing, discussions with consultants, evaluation  of patient's response to treatment, examination of patient, obtaining history from patient or surrogate, ordering and performing treatments and interventions, ordering and review of laboratory studies, ordering and review of radiographic studies, pulse oximetry and re-evaluation of patient's condition.  Jamie Kato, MD Pulmonary and Critical Care Medicine [facility] Pager: [phone]  10/19/2016, 11:07 PM

## 2016-10-13 NOTE — ED Triage Notes (Addendum)
Pt arrived from home via EMS. Per EMS, pt "more lethargic than normal" and "not with it." Per EMS, pt home nurse reported sats 78% on 5 L, turned up to 92% on 6L. Swelling noted bilateral feet; Pt A&Ox4; resp e/u; NAD noted at this time.

## 2016-10-13 NOTE — H&P (Signed)
HISTORY AND PHYSICAL       PATIENT DETAILS Name: Antonio [name] Age: 65 y.o. Sex: male Date of Birth: December 31, 1950 Admit Date: 10/06/2016 CXK:GYJEHU, Nolon Bussing, MD   Patient coming from: Home   CHIEF COMPLAINT:  Confusion-3 days  HPI: Antonio [name] is a 65 y.o. male with medical history significant of morbid obesity, history of OSA/OHS-noncompliant to BiPAP for approximately one year due to malfunction, chronic hypoxemic respiratory failure on 5 L of oxygen at home, chronic diastolic heart failure, history of severe aortic stenosis-s/p bioprosthetic aortic valve replacement-brought to the ED for evaluation of confusion. Please note, patient currently very encephalopathic and not able to provide any history, history is obtained after speaking with the ED staff and the patient's daughter at bedside. Apparently for the past 3 days, patient has had waxing and waning confusion. Daughter denies any history of fever, nausea, vomiting or diarrhea. There is no history of cough. This morning, patient was markedly more confused, as result patient was brought to the ED. While in the ED, initially patient was much more awake and alert and although intermittently confused was able to converse with the nursing staff. Lately, patient has become more confused and was started on BiPAP by the ED M.D. and I was called to admit this patient for further evaluation and treatment.  Unable to get any further history at this time.  ED Course: CBC/chemistry panel were unremarkable. VBG showed a pH of 7.4, PCO2 of 81. CT of the head was negative. Patient was placed on BiPAP, following which I was asked to admit this patient to a stepdown unit. During my evaluation, patient opens eyes for a short time with a sternal rub-and does not follow commands. Have instructed RN to perform a ABG around 5:30 PM and to keep patient in the ED for further repeat evaluation prior to sending the patient up to stepdown  unit.   REVIEW OF SYSTEMS: Obtained from after speaking with the patient's daughter Constitutional:   No  Fevers, chills  HEENT:    No headaches, Dysphagia,Tooth/dental problems,Sore throat,  No sneezing, itching, ear ache, nasal congestion, post nasal drip  Cardio-vascular: No chest pain, palpitations  GI:  No heartburn, indigestion, abdominal pain, nausea, vomiting, diarrhea  Resp: No hemoptysis,plueritic chest pain.   Skin:  No rash or lesions.  GU:  No dysuria, change in color of urine  Musculoskeletal: No joint pain or swelling.   Endocrine: No heat intolerance, no cold intolerance  Psych:  No memory loss.   ALLERGIES:  No Known Allergies  PAST MEDICAL HISTORY: Past Medical History:  Diagnosis Date  . Anxiety   . Aortic stenosis    a. s/p AVR 11/29/11  . Arthritis   . Blindness of right eye    a. due to amblyopia as child  . Chronic diastolic heart failure (HCC)    a. Echo 08/11/2015 LVEF 55-60% with normal RV function  . DM2 (diabetes mellitus, type 2) (HCC)   . Gout   . Hepatitis   . History of acute renal failure   . HLD (hyperlipidemia)   . HTN (hypertension)   . Hx MRSA infection    a. thigh abcess 2006  . OSA on CPAP    moderate with AHI 26/hr    PAST SURGICAL HISTORY: Past Surgical History:  Procedure Laterality Date  . AORTIC VALVE REPLACEMENT  11/29/2011   Procedure: AORTIC VALVE REPLACEMENT (AVR);  Surgeon: Kathlee Nations Trigt  III, MD;  Location: [facility] OR;  Service: Open Heart Surgery;  Laterality: N/A;  with nitric oxide  . BACK SURGERY    . CARDIAC CATHETERIZATION    . CARDIAC CATHETERIZATION N/A 10/14/2015   Procedure: Right Heart Cath;  Surgeon: Laurey Moralealton S McLean, MD;  [facility];  Service: Cardiovascular;  Laterality: N/A;  . CARDIAC VALVE REPLACEMENT    . INCISION AND DRAINAGE ABSCESS Left 08/2005   "upper/inner thigh; that's where the MRSA was"  . LUMBAR LAMINECTOMY  10/30/1983  . RIGHT HEART CATHETERIZATION N/A  11/28/2011   Procedure: RIGHT HEART CATH;  Surgeon: Kathleene Hazelhristopher D McAlhany, MD;  [facility];  Service: Cardiovascular;  Laterality: N/A;    MEDICATIONS AT HOME: Prior to Admission medications   Medication Sig Start Date End Date Taking? Authorizing Provider  allopurinol (ZYLOPRIM) 100 MG tablet Take 100 mg by mouth daily.   Yes Historical Provider, MD  aspirin 81 MG tablet Take 81 mg by mouth daily.   Yes Historical Provider, MD  atorvastatin (LIPITOR) 40 MG tablet Take 40 mg by mouth daily at 6 PM.  12/05/14  Yes Historical Provider, MD  BAYER MICROLET LANCETS lancets As directed   Yes Historical Provider, MD  BD PEN NEEDLE NANO U/F 32G X 4 MM MISC As directed 04/09/16  Yes Historical Provider, MD  carvedilol (COREG) 3.125 MG tablet Take 1 tablet (3.125 mg total) by mouth 2 (two) times daily. 09/26/16 12/25/16 Yes Graciella FreerMichael Andrew Tillery, PA-C  Coenzyme Q10 (CO Q-10 PO) Take 2 capsules by mouth daily.   Yes Historical Provider, MD  colchicine 0.6 MG tablet Take 1 tablet (0.6 mg total) by mouth daily. 06/01/16  Yes Ellsworth LennoxBrittany M Strader, PA  febuxostat (ULORIC) 40 MG tablet Take 40 mg by mouth daily.    Yes Historical Provider, MD  fenofibrate 54 MG tablet Take 54 mg by mouth daily. 05/04/15  Yes Historical Provider, MD  fluticasone (FLONASE) 50 MCG/ACT nasal spray Place 1 spray into both nostrils daily. 01/09/16  Yes Albertine GratesFang Xu, MD  glucose blood (BAYER CONTOUR NEXT TEST) test strip As directed 05/29/16  Yes Historical Provider, MD  HYDROcodone-acetaminophen (NORCO) 7.5-325 MG tablet Take 1 tablet by mouth every 4 (four) hours as needed for moderate pain. 06/11/16  Yes Zannie CovePreetha Raekwon, MD  insulin aspart (NOVOLOG) 100 UNIT/ML injection Inject into the skin 3 (three) times daily before meals. Per sliding scale   Yes Historical Provider, MD  Insulin Glargine (BASAGLAR KWIKPEN [place]) Inject 50 Units into the skin 2 (two) times daily.    Yes Historical Provider, MD  metolazone (ZAROXOLYN) 2.5 MG tablet Take 1  tablet (2.5 mg total) by mouth as needed (for weight gain, 3lbs overnight and 5lbs in 1 week, take extra KCL 20MeQ with this). 09/26/16  Yes Graciella FreerMichael Andrew Tillery, PA-C  Omega-3 Fatty Acids (FISH OIL) 1200 MG CAPS Take 1,200 mg by mouth daily.   Yes Historical Provider, MD  potassium chloride SA (K-DUR,KLOR-CON) 20 MEQ tablet Take 40 mEq by mouth 2 (two) times daily. Take an additional 20 meq (1 tablet) if take metolazone    Yes Historical Provider, MD  Saccharomyces boulardii (PROBIOTIC) 250 MG CAPS Take 250 mg by mouth 2 (two) times daily.   Yes Historical Provider, MD  vitamin C (ASCORBIC ACID) 500 MG tablet Take 500 mg by mouth daily.   Yes Historical Provider, MD  amoxicillin (AMOXIL) 500 MG tablet Take 4 tablets (2,000 mg total) by mouth as needed (1 hour prior to  dental work). 09/26/16   [name], PA-C  levothyroxine (SYNTHROID, LEVOTHROID) 50 MCG tablet Take 50 mcg by mouth daily before breakfast. 08/01/16   Historical Provider, MD  OXYGEN Inhale 4 L into the lungs at bedtime.    Historical Provider, MD  torsemide (DEMADEX) 20 MG tablet Take 4 tablets (80 mg total) by mouth 2 (two) times daily. 05/02/16   [name], MD    FAMILY HISTORY: Family History  Problem Relation Age of Onset  . Cancer Mother   . Heart attack Father      SOCIAL HISTORY:  reports that he has never smoked. He has never used smokeless tobacco. He reports that he drinks alcohol. He reports that he does not use drugs.  PHYSICAL EXAM: Blood pressure 138/80, pulse 96, temperature 98.6 F (37 C), temperature source Oral, resp. rate 13, height 5\' 11"  (1.803 m), weight (!) 179.2 kg (395 lb), SpO2 92 %.  General appearance: On BiPAP-barely arousable. The patient briefly opens eyes but does not follow commands.  Eyes:, pupils equally reactive to light and accomodation,no scleral icterus. HEENT: Atraumatic and Normocephalic Neck: supple Resp: Seems to be moving air anteriorly-do not hear any rhonchi.   CVS: S1 S2 regular-but distant GI: Bowel sounds present, Non tender and not distended with no gaurding, rigidity or rebound.very obese abdomen Extremities: B/L Lower Ext shows right more than left edema. Chronic venous stasis changes apparent.  Neurology: Lethargic-seems to be withdrawing all 4 extremities to pain. Very difficult exam. Musculoskeletal:No digital cyanosis Skin:No Rash, warm and dry Wounds:N/A  LABS ON ADMISSION:  I have personally reviewed following labs and imaging studies  CBC:  Recent Labs Lab 10/10/16 1228 10/27/2016 1247  WBC 11.3* 9.9  NEUTROABS  --  6.5  HGB 12.1* 11.7*  HCT 40.5 40.4  MCV 89.6 91.6  PLT 263 281    Basic Metabolic Panel:  Recent Labs Lab 10/10/16 1228 09/28/2016 1247  NA 141 143  K 4.6 4.8  CL 87* 89*  CO2 42* 47*  GLUCOSE 203* 207*  BUN 31* 27*  CREATININE 1.14 1.03  CALCIUM 9.4 9.0  MG  --  2.1    GFR: Estimated Creatinine Clearance: 116.6 mL/min (by C-G formula based on SCr of 1.03 mg/dL).  Liver Function Tests:  Recent Labs Lab 10/20/2016 1247  AST 18  ALT 18  ALKPHOS 78  BILITOT 0.6  PROT 7.1  ALBUMIN 3.1*   No results for input(s): LIPASE, AMYLASE in the last 168 hours.  Recent Labs Lab 10/05/2016 1615  AMMONIA 41*    Coagulation Profile: No results for input(s): INR, PROTIME in the last 168 hours.  Cardiac Enzymes: No results for input(s): CKTOTAL, CKMB, CKMBINDEX, TROPONINI in the last 168 hours.  BNP (last 3 results) No results for input(s): PROBNP in the last 8760 hours.  HbA1C: No results for input(s): HGBA1C in the last 72 hours.  CBG:  Recent Labs Lab 10/11/2016 1209 10/10/2016 1654  GLUCAP 212* 161*    Lipid Profile: No results for input(s): CHOL, HDL, LDLCALC, TRIG, CHOLHDL, LDLDIRECT in the last 72 hours.  Thyroid Function Tests: No results for input(s): TSH, T4TOTAL, FREET4, T3FREE, THYROIDAB in the last 72 hours.  Anemia Panel: No results for input(s): VITAMINB12, FOLATE,  FERRITIN, TIBC, IRON, RETICCTPCT in the last 72 hours.  Urine analysis:    Component Value Date/Time   COLORURINE YELLOW 10/20/2016 1252   APPEARANCEUR CLEAR 10/23/2016 1252   LABSPEC 1.012 10/06/2016 1252   PHURINE 6.0 10/22/2016 1252  GLUCOSEU 50 (A) 09/29/2016 1252   HGBUR NEGATIVE 09/29/2016 1252   HGBUR negative 06/12/2007 0843   BILIRUBINUR NEGATIVE 10/14/2016 1252   KETONESUR NEGATIVE 10/25/2016 1252   PROTEINUR 100 (A) 10/03/2016 1252   UROBILINOGEN 0.2 06/15/2015 0029   NITRITE NEGATIVE 10/07/2016 1252   LEUKOCYTESUR NEGATIVE 10/05/2016 1252    Sepsis Labs: Lactic Acid, Venous    Component Value Date/Time   LATICACIDVEN 0.69 10/15/2016 1631     Microbiology: No results found for this or any previous visit (from the past 240 hour(s)).    RADIOLOGIC STUDIES ON ADMISSION: Dg Chest 2 View  Result Date: 10/14/2016 CLINICAL DATA:  Increased lethargy. EXAM: CHEST  2 VIEW COMPARISON:  06/01/2016 FINDINGS: Post median sternotomy changes and valve replacement, stable. The cardiac silhouette is enlarged. Mediastinal contours appear intact. Diffusely present interstitial and alveolar opacities. Possible left pleural effusion. Osseous structures are without acute abnormality. Soft tissues are grossly normal. IMPRESSION: Enlarged cardiac silhouette. Interstitial and alveolar opacities which may be seen in mixed pattern pulmonary edema. Probable left pleural effusion. Electronically Signed   By: Ted Mcalpine M.D.   On: 10/23/2016 16:09   Ct Head Wo Contrast  Result Date: 09/28/2016 CLINICAL DATA:  Altered mental status.  Severe shortness of breath. EXAM: CT HEAD WITHOUT CONTRAST TECHNIQUE: Contiguous axial images were obtained from the base of the skull through the vertex without intravenous contrast. COMPARISON:  None. FINDINGS: Brain: Old right caudate head lacunar infarct. Otherwise, normal appearing cerebral hemispheres and posterior fossa structures. Normal size and  position of the ventricles. No intracranial hemorrhage, mass lesion or CT evidence of acute infarction. Vascular: Bilateral vertebral and cavernous internal carotid artery atheromatous calcifications. Skull: Normal. Negative for fracture or focal lesion. Sinuses/Orbits: No acute finding. Other: None. IMPRESSION: 1. No acute abnormality. 2. Old right caudate head lacunar infarct. 3. Atheromatous arterial calcifications at the skull base. Electronically Signed   By: Beckie Salts M.D.   On: 10/23/2016 16:27    I have personally reviewed images of chest xray or the CT head  EKG:  Personally reviewed. RBBB  ASSESSMENT AND PLAN: Present on Admission: . Acute metabolic Encephalopathy: Etiology thought to be secondary to hypercarbia-waiting on ABG to confirm. Patient already started on BiPAP by ED M.D. on the basis of VBG. Have asked RN to repeat ABG in after being on BIPAP for 30-40. CT of the head is negative, by history there is no indication of any infection. We will follow closely and monitor and stepdown. May need PCCM evaluation if no improvement.Keep nothing by mouth-hold all oral medications to his encephalopathy improves.  . Suspected acute on chronic hypoxic and hypercarbic respiratory failure: Likely secondary to OSA/OHS and acute diastolic heart failure. See above regarding ABG, continue IV Lasix. We will reassess after ABG has been obtained.  . Acute diastolic heart failure: Start IV Lasix, daily weights-strict intake/output. Keep nothing by mouth-hold all oral medications to his encephalopathy improves.  . OHS/OSA: Noncompliant to BiPAP for close to one year-per family-his CPAP machine broke approximately one year back and has not yet been replaced. Currently on BiPAP, see above regarding plans for ABG  . EDEMA-LEGS,DUE TO VENOUS OBSTRUCTION: Has right more than left lower extremity edema-have ordered a venous duplex. Per family, he has chronic lower extremity edema. If venous duplex  positive, will start on full dose anticoagulation.  Marland Kitchen HYPERTENSION, BENIGN SYSTEMIC: Given encephalopathy-hold all oral medications for now-blood pressure currently seems reasonably well, controlled.  . Insulin dependent diabetes: Hold long-acting insulin  for now-patient very encephalopathic and will not be able to tolerate any oral intake. Start SSI-follow CBGs. Start long-acting insulin when encephalopathy has improved and patient tolerating oral intake.  . CKD (chronic kidney disease), stage III: Creatinine close to usual baseline, follow.  . Gout: No evidence of flare-resume allopurinol and encephalopathy improves.  . Hyperlipidemia: Hold statin-currently encephalopathic-resume when able  Further plan will depend as patient's clinical course evolves and further radiologic and laboratory data become available. Patient will be monitored closely.   Above noted plan was discussed with daughter face to face at bedside, she was in agreement.   CONSULTS: None  DVT Prophylaxis: Prophylactic Lovenox   Code Status: Full Code-daughter not aware of any advanced directives at this time-has been a full code during his past hospitalization as well.  Disposition Plan:  Discharge back home vs SNF possibly in 3-4 days days, depending on clinical course  Admission status:  Inpatient  going to SDU  Total time spent  65 minutes.Greater than 50% of this time was spent in counseling, explanation of diagnosis, planning of further management, and coordination of care.  [facility] Pager [phone]  If 7PM-7AM, please contact night-coverage [email or web address] Password [email or web address] 10/27/2016, 5:25 PM

## 2016-10-13 NOTE — Progress Notes (Signed)
Pt placed on Bipap, barely woke up. Vitals stable, MD aware. Daughter at bedside.

## 2016-10-13 NOTE — Progress Notes (Signed)
eLink Physician-Brief Progress Note Patient Name: Antonio [name] DOB: January 01, 1951 MRN: [id number]   Date of Service  09/30/2016  HPI/Events of Note  agitation  eICU Interventions  Place soft restraints     Intervention Category Major Interventions: Delirium, psychosis, severe agitation - evaluation and management  [name] 10/27/2016, 9:33 PM

## 2016-10-13 NOTE — Progress Notes (Signed)
eLink Physician-Brief Progress Note Patient Name: Antonio [name] DOB: 06-Apr-1951 MRN: [id number]   Date of Service  10-22-16  HPI/Events of Note  Needs SUP  eICU Interventions  IV H2 pepcid given     Intervention Category Intermediate Interventions: Best-practice therapies (e.g. DVT, beta blocker, etc.)  [name] 2016/10/22, 10:34 PM

## 2016-10-13 NOTE — Plan of Care (Signed)
Problem: Education: Goal: Knowledge of [facility] Education information/materials will improve Outcome: Progressing Discussed with family at length

## 2016-10-14 ENCOUNTER — Encounter (HOSPITAL_COMMUNITY): Payer: Medicare Other

## 2016-10-14 ENCOUNTER — Inpatient Hospital Stay (HOSPITAL_COMMUNITY): Payer: Medicare Other

## 2016-10-14 DIAGNOSIS — J9622 Acute and chronic respiratory failure with hypercapnia: Secondary | ICD-10-CM

## 2016-10-14 LAB — GLUCOSE, CAPILLARY
GLUCOSE-CAPILLARY: 342 mg/dL — AB (ref 65–99)
Glucose-Capillary: 176 mg/dL — ABNORMAL HIGH (ref 65–99)
Glucose-Capillary: 185 mg/dL — ABNORMAL HIGH (ref 65–99)
Glucose-Capillary: 202 mg/dL — ABNORMAL HIGH (ref 65–99)
Glucose-Capillary: 245 mg/dL — ABNORMAL HIGH (ref 65–99)
Glucose-Capillary: 299 mg/dL — ABNORMAL HIGH (ref 65–99)
Glucose-Capillary: 334 mg/dL — ABNORMAL HIGH (ref 65–99)

## 2016-10-14 LAB — BASIC METABOLIC PANEL
ANION GAP: 10 (ref 5–15)
Anion gap: 19 — ABNORMAL HIGH (ref 5–15)
BUN: 27 mg/dL — ABNORMAL HIGH (ref 6–20)
BUN: 31 mg/dL — ABNORMAL HIGH (ref 6–20)
CALCIUM: 9 mg/dL (ref 8.9–10.3)
CALCIUM: 8.2 mg/dL — AB (ref 8.9–10.3)
CHLORIDE: 90 mmol/L — AB (ref 101–111)
CO2: 34 mmol/L — AB (ref 22–32)
CO2: 37 mmol/L — AB (ref 22–32)
CREATININE: 1.23 mg/dL (ref 0.61–1.24)
CREATININE: 1.68 mg/dL — AB (ref 0.61–1.24)
Chloride: 88 mmol/L — ABNORMAL LOW (ref 101–111)
GFR calc Af Amer: >60 mL/min (ref >60)
GFR calc Af Amer: 47 mL/min — ABNORMAL LOW (ref >60)
GFR calc non Af Amer: 59 mL/min — ABNORMAL LOW (ref >60)
GFR calc non Af Amer: 41 mL/min — ABNORMAL LOW (ref >60)
GLUCOSE: 173 mg/dL — AB (ref 65–99)
GLUCOSE: 337 mg/dL — AB (ref 65–99)
Potassium: 3.4 mmol/L — ABNORMAL LOW (ref 3.5–5.1)
Potassium: 3.5 mmol/L (ref 3.5–5.1)
Sodium: 143 mmol/L (ref 135–145)
Sodium: 135 mmol/L (ref 135–145)

## 2016-10-14 LAB — MAGNESIUM
MAGNESIUM: 1.7 mg/dL (ref 1.7–2.4)
MAGNESIUM: 2 mg/dL (ref 1.7–2.4)

## 2016-10-14 LAB — CBC
HCT: 38.2 % — ABNORMAL LOW (ref 39.0–52.0)
Hemoglobin: 10.8 g/dL — ABNORMAL LOW (ref 13.0–17.0)
MCH: 25.8 pg — ABNORMAL LOW (ref 26.0–34.0)
MCHC: 28.3 g/dL — AB (ref 30.0–36.0)
MCV: 91.4 fL (ref 78.0–100.0)
PLATELETS: 258 10*3/uL (ref 150–400)
RBC: 4.18 MIL/uL — ABNORMAL LOW (ref 4.22–5.81)
RDW: 14.2 % (ref 11.5–15.5)
WBC: 10.1 10*3/uL (ref 4.0–10.5)

## 2016-10-14 LAB — POCT I-STAT 3, ART BLOOD GAS (G3+)
Acid-Base Excess: 20 mmol/L — ABNORMAL HIGH (ref 0.0–2.0)
BICARBONATE: 42.1 mmol/L — AB (ref 20.0–28.0)
O2 SAT: 92 %
PCO2 ART: 37.2 mmHg (ref 32.0–48.0)
TCO2: 43 mmol/L (ref 0–100)
pH, Arterial: 7.664 (ref 7.350–7.450)
pO2, Arterial: 53 mmHg — ABNORMAL LOW (ref 83.0–108.0)

## 2016-10-14 LAB — PHOSPHORUS
PHOSPHORUS: 2.4 mg/dL — AB (ref 2.5–4.6)
Phosphorus: <1 mg/dL — CL (ref 2.5–4.6)

## 2016-10-14 LAB — PROCALCITONIN: Procalcitonin: <0.1 ng/mL

## 2016-10-14 LAB — TRIGLYCERIDES: TRIGLYCERIDES: 274 mg/dL — AB (ref <150)

## 2016-10-14 MED ORDER — CARVEDILOL 3.125 MG PO TABS: 3.1250 mg | ORAL_TABLET | Freq: Two times a day (BID) | ORAL | Status: DC

## 2016-10-14 MED ORDER — ENOXAPARIN SODIUM 100 MG/ML ~~LOC~~ SOLN: 100.0000 mg | SUBCUTANEOUS | Status: DC

## 2016-10-14 MED ORDER — ASPIRIN 81 MG PO CHEW
81.0000 mg | CHEWABLE_TABLET | Freq: Every day | ORAL | Status: DC
  Administered 2016-10-14 – 2016-10-24 (×11): 81 mg
  Filled 2016-10-14 (×10): qty 1

## 2016-10-14 MED ORDER — ASPIRIN 81 MG PO TABS: 81.0000 mg | ORAL_TABLET | Freq: Every day | ORAL | Status: DC

## 2016-10-14 MED ORDER — POTASSIUM CHLORIDE 20 MEQ/15ML (10%) PO SOLN
40.0000 meq | Freq: Once | ORAL | Status: AC
  Administered 2016-10-14: 40 meq
  Filled 2016-10-14: qty 30

## 2016-10-14 MED ORDER — CARVEDILOL 3.125 MG PO TABS
3.1250 mg | ORAL_TABLET | Freq: Two times a day (BID) | ORAL | Status: DC
  Administered 2016-10-14 – 2016-10-24 (×21): 3.125 mg
  Filled 2016-10-14 (×21): qty 1

## 2016-10-14 MED ORDER — ENOXAPARIN SODIUM 100 MG/ML ~~LOC~~ SOLN
100.0000 mg | SUBCUTANEOUS | Status: DC
  Administered 2016-10-15 – 2016-10-18 (×4): 100 mg via SUBCUTANEOUS
  Filled 2016-10-14 (×4): qty 1

## 2016-10-14 MED ORDER — ACETAMINOPHEN 325 MG PO TABS: 650.0000 mg | ORAL_TABLET | Freq: Four times a day (QID) | ORAL | Status: DC | PRN

## 2016-10-14 MED ORDER — ATORVASTATIN CALCIUM 40 MG PO TABS: 40.0000 mg | ORAL_TABLET | Freq: Every day | ORAL | Status: DC

## 2016-10-14 MED ORDER — PRO-STAT SUGAR FREE PO LIQD
30.0000 mL | Freq: Two times a day (BID) | ORAL | Status: DC
  Administered 2016-10-14 – 2016-10-15 (×3): 30 mL
  Filled 2016-10-14 (×3): qty 30

## 2016-10-14 MED ORDER — MIDAZOLAM HCL 2 MG/2ML IJ SOLN: 2.0000 mg | INTRAMUSCULAR | Status: DC | PRN

## 2016-10-14 MED ORDER — ENOXAPARIN SODIUM 60 MG/0.6ML ~~LOC~~ SOLN
60.0000 mg | Freq: Once | SUBCUTANEOUS | Status: AC
  Administered 2016-10-14: 60 mg via SUBCUTANEOUS
  Filled 2016-10-14: qty 0.6

## 2016-10-14 MED ORDER — POTASSIUM CHLORIDE 2 MEQ/ML IV SOLN
30.0000 meq | Freq: Once | INTRAVENOUS | Status: AC
  Administered 2016-10-14: 30 meq via INTRAVENOUS
  Filled 2016-10-14: qty 15

## 2016-10-14 MED ORDER — VITAL HIGH PROTEIN PO LIQD
1000.0000 mL | ORAL | Status: DC
  Administered 2016-10-14 – 2016-10-15 (×2): 1000 mL

## 2016-10-14 MED ORDER — FAMOTIDINE 40 MG/5ML PO SUSR
20.0000 mg | Freq: Two times a day (BID) | ORAL | Status: DC
  Administered 2016-10-14 – 2016-10-24 (×20): 20 mg
  Filled 2016-10-14 (×20): qty 2.5

## 2016-10-14 MED ORDER — LEVOTHYROXINE SODIUM 50 MCG PO TABS
50.0000 ug | ORAL_TABLET | Freq: Every day | ORAL | Status: DC
  Administered 2016-10-15 – 2016-10-24 (×10): 50 ug
  Filled 2016-10-14 (×10): qty 1

## 2016-10-14 MED ORDER — IPRATROPIUM-ALBUTEROL 0.5-2.5 (3) MG/3ML IN SOLN: 3.0000 mL | RESPIRATORY_TRACT | Status: DC | PRN

## 2016-10-14 MED ORDER — ATORVASTATIN CALCIUM 40 MG PO TABS
40.0000 mg | ORAL_TABLET | Freq: Every day | ORAL | Status: DC
  Administered 2016-10-14 – 2016-10-23 (×10): 40 mg
  Filled 2016-10-14 (×10): qty 1

## 2016-10-14 MED ORDER — ACETAMINOPHEN 650 MG RE SUPP: 650.0000 mg | Freq: Four times a day (QID) | RECTAL | Status: DC | PRN

## 2016-10-14 MED ORDER — FAMOTIDINE 40 MG/5ML PO SUSR: 20.0000 mg | Freq: Two times a day (BID) | ORAL | Status: DC

## 2016-10-14 MED ORDER — FENTANYL CITRATE (PF) 100 MCG/2ML IJ SOLN
100.0000 ug | INTRAMUSCULAR | Status: DC | PRN
  Administered 2016-10-14 – 2016-10-15 (×6): 100 ug via INTRAVENOUS
  Filled 2016-10-14 (×6): qty 2

## 2016-10-14 MED ORDER — SODIUM PHOSPHATES 45 MMOLE/15ML IV SOLN
20.0000 mmol | Freq: Once | INTRAVENOUS | Status: AC
  Administered 2016-10-14: 20 mmol via INTRAVENOUS
  Filled 2016-10-14: qty 6.67

## 2016-10-14 NOTE — Progress Notes (Signed)
[facility] ADULT ICU REPLACEMENT PROTOCOL FOR AM LAB REPLACEMENT ONLY  The patient does apply for the [facility] Adult ICU Electrolyte Replacment Protocol based on the criteria listed below:   1. Is GFR >/= 40 ml/min? Yes.    Patient's GFR today is >60 2. Is urine output >/= 0.5 ml/kg/hr for the last 6 hours? Yes.   Patient's UOP is 0.5 ml/kg/hr 3. Is BUN < 60 mg/dL? Yes.    Patient's BUN today is 27 4. Abnormal electrolyte  K 3.4 5. Ordered repletion with: per protocol 6. If a panic level lab has been reported, has the CCM MD in charge been notified? Yes.  .   Physician:  Tonny Branch, Lang Snow 10/14/2016 5:47 AM

## 2016-10-14 NOTE — Progress Notes (Signed)
CRITICAL VALUE ALERT  Critical value received:  phosporous    Date of notification:  Today   Time of notification:  1315  Critical value read back:  Yes   Nurse who received alert:  Delanna Ahmadi RN  MD notified (1st page):  Dr. Craige Cotta   Time of first page:  1321   MD notified (2nd page):1345  Time of second page:  Responding MD:  Dr. Delford Field via Pola Corn   Time MD responded:  [phone]

## 2016-10-14 NOTE — Progress Notes (Signed)
PULMONARY / CRITICAL CARE MEDICINE    ADMISSION DATE:  10/28/2016 REFERRING PROVIDER: Dr. Dalene Seltzer CHIEF COMPLAINT:  Dyspnea, confusion  HISTORY OF PRESENT ILLNESS:   65 yo male with hx of OSA/OHS and followed by cardiology.  Family reports he has not been able to get set up with PAP therapy at home.  He presented with dyspnea and altered mental status from acute on chronic hypoxic/hypercapnic respiratory failure leading to VDRF.  PMHx of CAD, AS, s/p CABG with AVR 2012, diastolic CHF, DM, HTN.  SUBJECTIVE: Sedated on vent  LE edema R>L , +redness   VITAL SIGNS: BP 132/61   Pulse 84   Temp 100.3 F (37.9 C)   Resp 20   Ht 5\' 11"  (1.803 m)   Wt (!) 215.5 kg (475 lb)   SpO2 96%   BMI 66.25 kg/m   INTAKE / OUTPUT: I/O last 3 completed shifts: In: 929 [I.V.:584; NG/GT:30; IV Piggyback:315] Out: 2125 [Urine:2125]  PHYSICAL EXAMINATION: General:  Obese man, intubated Neuro:  Sedated on vent , wakes to pain  HEENT: ETT  Cardiovascular:  Distant , RRR , 1-2 +edema R>L  Lungs:  Crackles bilaterally Abdomen:  Obese but soft Musculoskeletal:  No deformities  Skin:  Stasis dermatatic changes in LE , scaling around feet   LABS:  BMET  Recent Labs Lab 10/10/16 1228 10/02/2016 1247 10/14/16 0326  NA 141 143 143  K 4.6 4.8 3.4*  CL 87* 89* 90*  CO2 42* 47* 34*  BUN 31* 27* 27*  CREATININE 1.14 1.03 1.23  GLUCOSE 203* 207* 173*    Electrolytes  Recent Labs Lab 10/10/16 1228 10/01/2016 1247 10/14/16 0326  CALCIUM 9.4 9.0 9.0  MG  --  2.1  --     CBC  Recent Labs Lab 10/10/16 1228 10/11/2016 1247 10/14/16 0326  WBC 11.3* 9.9 10.1  HGB 12.1* 11.7* 10.8*  HCT 40.5 40.4 38.2*  PLT 263 281 258    Coag's No results for input(s): APTT, INR in the last 168 hours.  Sepsis Markers  Recent Labs Lab 09/30/2016 1300 10/14/2016 1631  LATICACIDVEN 1.86 0.69    ABG  Recent Labs Lab 10/03/2016 1820  PHART 7.327*  PCO2ART 96.4*  PO2ART 76.7*    Liver  Enzymes  Recent Labs Lab 09/29/2016 1247  AST 18  ALT 18  ALKPHOS 78  BILITOT 0.6  ALBUMIN 3.1*    Cardiac Enzymes No results for input(s): TROPONINI, PROBNP in the last 168 hours.  Glucose  Recent Labs Lab 10/09/2016 1209 10/01/2016 1654 09/30/2016 2129 10/14/16 0007 10/14/16 0438 10/14/16 0839  GLUCAP 212* 161* 150* 185* 176* 202*    Imaging Dg Chest 2 View  Result Date: 10/11/2016 CLINICAL DATA:  Increased lethargy. EXAM: CHEST  2 VIEW COMPARISON:  06/01/2016 FINDINGS: Post median sternotomy changes and valve replacement, stable. The cardiac silhouette is enlarged. Mediastinal contours appear intact. Diffusely present interstitial and alveolar opacities. Possible left pleural effusion. Osseous structures are without acute abnormality. Soft tissues are grossly normal. IMPRESSION: Enlarged cardiac silhouette. Interstitial and alveolar opacities which may be seen in mixed pattern pulmonary edema. Probable left pleural effusion. Electronically Signed   By: Ted Mcalpine M.D.   On: 10/21/2016 16:09   Ct Head Wo Contrast  Result Date: 10/18/2016 CLINICAL DATA:  Altered mental status.  Severe shortness of breath. EXAM: CT HEAD WITHOUT CONTRAST TECHNIQUE: Contiguous axial images were obtained from the base of the skull through the vertex without intravenous contrast. COMPARISON:  None. FINDINGS: Brain: Old right  caudate head lacunar infarct. Otherwise, normal appearing cerebral hemispheres and posterior fossa structures. Normal size and position of the ventricles. No intracranial hemorrhage, mass lesion or CT evidence of acute infarction. Vascular: Bilateral vertebral and cavernous internal carotid artery atheromatous calcifications. Skull: Normal. Negative for fracture or focal lesion. Sinuses/Orbits: No acute finding. Other: None. IMPRESSION: 1. No acute abnormality. 2. Old right caudate head lacunar infarct. 3. Atheromatous arterial calcifications at the skull base. Electronically  Signed   By: Beckie SaltsSteven  Reid M.D.   On: 10/05/2016 16:27   Dg Chest Portable 1 View  Result Date: 10/05/2016 CLINICAL DATA:  65 y/o  M; post intubation. EXAM: PORTABLE CHEST 1 VIEW COMPARISON:  10/01/2016 chest radiograph FINDINGS: Endotracheal tube tip approximately 2.6 cm from carina. Unchanged diffuse airspace opacities and low lung volumes. Probable small effusions. Post median sternotomy. Enteric tube tip ends in the distal esophagus, advancement is recommended. IMPRESSION: Endotracheal tube 2.6 cm from carina. Enteric tube tip ends in distal esophagus, advancement recommended. Stable diffuse airspace opacities and low lung volumes. These results were called by telephone at the time of interpretation on 10/20/2016 at 8:44 pm to Dr. Jacalyn LefevreJULIE HAVILAND , who verbally acknowledged these results. Electronically Signed   By: Mitzi HansenLance  Furusawa-Stratton M.D.   On: 10/23/2016 20:46   STUDIES:  Head CT 12/16 >> no acute findings  CULTURES: None  ANTIBIOTICS: None  SIGNIFICANT EVENTS: 12/16 Intubated in the ED after failing BiPAP  LINES/TUBES: ETT 12/16 >>   DISCUSSION: 65 y/o man with obesity hypoventilation syndrome presenting with decompensated respiratory failure.  ASSESSMENT / PLAN:  Acute on chronic hypoxic/hypercapnic respiratory failure in setting of OSA/OHS. - full vent support - f/u CXR, ABG - prn BDs  Cor pulmonale. Hx of CAD, bioprosthetic AVR, chronic diastolic CHF. - continue lasix - f/u doppler legs  Anemia of critical illness. - f/u CBC  DM. - SSI  Hx of hypothyroidism. - synthroid  Acute metabolic encephalopathy. - RASS goal 0  DVT prophylaxis - lovenox SUP - pepcid Nutrition - protonix Goals of care - full code  Tammy Parrett NP-C  [facility]  [phone]  10/14/2016, 8:49 AM  Remains on vent.  Settings adjusted.  No wheeze.  1+ edema.  HR regular.  Abd soft.  CXR - pulmonary edema.  Assessment/plan:  Acute on chronic  respiratory failure. - had extensive d/w pt's daughters.  Explained process of assessing respiratory status.  Raised possibility that he might need tracheostomy and long term vent support.  Ultimately he needs to lose weight  CC time by me independent of APP time 31 minutes  Coralyn HellingVineet Keeven Matty, MD [facility] 10/14/2016, 11:35 AM Pager:  [phone] After 3pm call: [phone]

## 2016-10-14 NOTE — Progress Notes (Signed)
eLink Physician-Brief Progress Note Patient Name: Antonio [name] DOB: [birth year] MRN: [id number]   Date of Service  10/14/2016  HPI/Events of Note  PO4 < 1,  K low  eICU Interventions  supplement     Intervention Category Major Interventions: Electrolyte abnormality - evaluation and management  [name] 10/14/2016, 3:51 PM

## 2016-10-15 ENCOUNTER — Inpatient Hospital Stay (HOSPITAL_COMMUNITY): Payer: Medicare Other

## 2016-10-15 DIAGNOSIS — G9340 Encephalopathy, unspecified: Secondary | ICD-10-CM

## 2016-10-15 DIAGNOSIS — R609 Edema, unspecified: Secondary | ICD-10-CM

## 2016-10-15 DIAGNOSIS — J810 Acute pulmonary edema: Secondary | ICD-10-CM

## 2016-10-15 DIAGNOSIS — E662 Morbid (severe) obesity with alveolar hypoventilation: Secondary | ICD-10-CM

## 2016-10-15 LAB — BLOOD GAS, ARTERIAL
Acid-Base Excess: 21.9 mmol/L — ABNORMAL HIGH (ref 0.0–2.0)
BICARBONATE: 49 mmol/L — AB (ref 20.0–28.0)
DELIVERY SYSTEMS: POSITIVE
Expiratory PAP: 8
FIO2: 40
Inspiratory PAP: 18
LHR: 12 {breaths}/min
O2 SAT: 94 %
PH ART: 7.327 — AB (ref 7.350–7.450)
Patient temperature: 98.6
pCO2 arterial: 96.4 mmHg (ref 32.0–48.0)
pO2, Arterial: 76.7 mmHg — ABNORMAL LOW (ref 83.0–108.0)

## 2016-10-15 LAB — BASIC METABOLIC PANEL
ANION GAP: 12 (ref 5–15)
BUN: 36 mg/dL — ABNORMAL HIGH (ref 6–20)
CO2: 38 mmol/L — AB (ref 22–32)
Calcium: 8.6 mg/dL — ABNORMAL LOW (ref 8.9–10.3)
Chloride: 90 mmol/L — ABNORMAL LOW (ref 101–111)
Creatinine, Ser: 1.68 mg/dL — ABNORMAL HIGH (ref 0.61–1.24)
GFR calc Af Amer: 47 mL/min — ABNORMAL LOW (ref >60)
GFR calc non Af Amer: 41 mL/min — ABNORMAL LOW (ref >60)
GLUCOSE: 322 mg/dL — AB (ref 65–99)
POTASSIUM: 3.3 mmol/L — AB (ref 3.5–5.1)
Sodium: 140 mmol/L (ref 135–145)

## 2016-10-15 LAB — GLUCOSE, CAPILLARY
GLUCOSE-CAPILLARY: 274 mg/dL — AB (ref 65–99)
GLUCOSE-CAPILLARY: 302 mg/dL — AB (ref 65–99)
GLUCOSE-CAPILLARY: 375 mg/dL — AB (ref 65–99)
GLUCOSE-CAPILLARY: 381 mg/dL — AB (ref 65–99)
GLUCOSE-CAPILLARY: 397 mg/dL — AB (ref 65–99)
Glucose-Capillary: 322 mg/dL — ABNORMAL HIGH (ref 65–99)
Glucose-Capillary: 349 mg/dL — ABNORMAL HIGH (ref 65–99)
Glucose-Capillary: 381 mg/dL — ABNORMAL HIGH (ref 65–99)
Glucose-Capillary: 341 mg/dL — ABNORMAL HIGH (ref 65–99)
Glucose-Capillary: 287 mg/dL — ABNORMAL HIGH (ref 65–99)

## 2016-10-15 LAB — POCT I-STAT 3, ART BLOOD GAS (G3+)
Acid-Base Excess: 20 mmol/L — ABNORMAL HIGH (ref 0.0–2.0)
Bicarbonate: 44.3 mmol/L — ABNORMAL HIGH (ref 20.0–28.0)
O2 Saturation: 91 %
PCO2 ART: 51.2 mmHg — AB (ref 32.0–48.0)
PH ART: 7.547 — AB (ref 7.350–7.450)
Patient temperature: 99.3
TCO2: 46 mmol/L (ref 0–100)
pO2, Arterial: 56 mmHg — ABNORMAL LOW (ref 83.0–108.0)

## 2016-10-15 LAB — MAGNESIUM
MAGNESIUM: 2.2 mg/dL (ref 1.7–2.4)
Magnesium: 1.9 mg/dL (ref 1.7–2.4)

## 2016-10-15 LAB — CBC
HEMATOCRIT: 33.7 % — AB (ref 39.0–52.0)
Hemoglobin: 10.2 g/dL — ABNORMAL LOW (ref 13.0–17.0)
MCH: 26.6 pg (ref 26.0–34.0)
MCHC: 30.3 g/dL (ref 30.0–36.0)
MCV: 87.8 fL (ref 78.0–100.0)
Platelets: 252 10*3/uL (ref 150–400)
RBC: 3.84 MIL/uL — AB (ref 4.22–5.81)
RDW: 15.2 % (ref 11.5–15.5)
WBC: 10.3 10*3/uL (ref 4.0–10.5)

## 2016-10-15 LAB — PHOSPHORUS
PHOSPHORUS: 3 mg/dL (ref 2.5–4.6)
PHOSPHORUS: 3.4 mg/dL (ref 2.5–4.6)

## 2016-10-15 LAB — PROCALCITONIN: Procalcitonin: 0.34 ng/mL

## 2016-10-15 MED ORDER — INSULIN ASPART 100 UNIT/ML ~~LOC~~ SOLN
2.0000 [IU] | SUBCUTANEOUS | Status: DC
  Administered 2016-10-15: 10 [IU] via SUBCUTANEOUS

## 2016-10-15 MED ORDER — SODIUM CHLORIDE 0.9 % IV SOLN
INTRAVENOUS | Status: DC
  Administered 2016-10-15: 3.4 [IU]/h via INTRAVENOUS
  Filled 2016-10-15: qty 2.5

## 2016-10-15 MED ORDER — VITAL HIGH PROTEIN PO LIQD
1000.0000 mL | ORAL | Status: DC
  Administered 2016-10-15 – 2016-10-16 (×2): 1000 mL

## 2016-10-15 MED ORDER — PRO-STAT SUGAR FREE PO LIQD
60.0000 mL | Freq: Three times a day (TID) | ORAL | Status: DC
  Administered 2016-10-15 – 2016-10-24 (×27): 60 mL
  Filled 2016-10-15 (×28): qty 60

## 2016-10-15 MED ORDER — MAGNESIUM SULFATE 2 GM/50ML IV SOLN
2.0000 g | Freq: Once | INTRAVENOUS | Status: AC
  Administered 2016-10-15: 2 g via INTRAVENOUS
  Filled 2016-10-15: qty 50

## 2016-10-15 MED ORDER — VITAL HIGH PROTEIN PO LIQD: 1000.0000 mL | ORAL | Status: DC

## 2016-10-15 MED ORDER — METOLAZONE 5 MG PO TABS
5.0000 mg | ORAL_TABLET | Freq: Every day | ORAL | Status: AC
  Administered 2016-10-15: 5 mg via ORAL
  Filled 2016-10-15 (×2): qty 1

## 2016-10-15 MED ORDER — FUROSEMIDE 10 MG/ML IJ SOLN
8.0000 mg/h | INTRAVENOUS | Status: DC
  Administered 2016-10-15: 8 mg/h via INTRAVENOUS
  Filled 2016-10-15: qty 25

## 2016-10-15 MED ORDER — MIDAZOLAM HCL 2 MG/2ML IJ SOLN
1.0000 mg | INTRAMUSCULAR | Status: DC | PRN
  Administered 2016-10-15 – 2016-10-23 (×17): 2 mg via INTRAVENOUS
  Filled 2016-10-15 (×18): qty 2

## 2016-10-15 MED ORDER — SODIUM CHLORIDE 0.9 % IV SOLN
25.0000 ug/h | INTRAVENOUS | Status: DC
  Administered 2016-10-15: 300 ug/h via INTRAVENOUS
  Administered 2016-10-15: 50 ug/h via INTRAVENOUS
  Administered 2016-10-16 (×2): 300 ug/h via INTRAVENOUS
  Administered 2016-10-17: 325 ug/h via INTRAVENOUS
  Administered 2016-10-17: 350 ug/h via INTRAVENOUS
  Administered 2016-10-17: 325 ug/h via INTRAVENOUS
  Administered 2016-10-17 – 2016-10-18 (×4): 350 ug/h via INTRAVENOUS
  Administered 2016-10-19 – 2016-10-20 (×2): 300 ug/h via INTRAVENOUS
  Administered 2016-10-20 – 2016-10-24 (×7): 150 ug/h via INTRAVENOUS
  Filled 2016-10-15 (×21): qty 50

## 2016-10-15 MED ORDER — POTASSIUM CHLORIDE 20 MEQ/15ML (10%) PO SOLN
40.0000 meq | Freq: Three times a day (TID) | ORAL | Status: AC
  Administered 2016-10-15 (×2): 40 meq
  Filled 2016-10-15 (×3): qty 30

## 2016-10-15 NOTE — Consult Note (Signed)
WOC Nurse wound consult note Reason for Consult: Consult requested for toe ulcers.  Pt has several dry blood blisters to multiple toes which peeled off easily, revealing pink dry intact skin to left foot, and partial thickness wound to right plantar great toe, .2X.2X.1cm, pink and dry, no odor, drainage, or topical treatment indicated. Please re-consult if further assistance is needed.  Thank-you,  Cammie Mcgee MSN, RN, CWOCN, [place], CNS [phone]

## 2016-10-15 NOTE — Progress Notes (Signed)
Initial Nutrition Assessment  DOCUMENTATION CODES:   Morbid obesity  INTERVENTION:    Initiate Vital High Protein at goal rate of 50 ml/h (1200 ml per day) and Prostat 60 ml TID   TF regimen + current Propofol to provide 2335 kcals, 195 gm protein, 1003 ml of free water  NUTRITION DIAGNOSIS:   Inadequate oral intake related to inability to eat as evidenced by NPO status  GOAL:   Provide needs based on ASPEN/SCCM guidelines  MONITOR:   TF tolerance, Vent status, Labs, Weight trends, Skin, I & O's  REASON FOR ASSESSMENT:   Consult Enteral/tube feeding initiation and management  ASSESSMENT:   [name] with hx of OSA/OHS and followed by cardiology.  Family reports he has not been able to get set up with PAP therapy at home.  He presented with dyspnea and altered mental status from acute on chronic hypoxic/hypercapnic respiratory failure leading to VDRF.  Patient is currently intubated on ventilator support  >> OGT in place Temp (24hrs), Avg:99.4 F (37.4 C), Min:99 F (37.2 C), Max:99.7 F (37.6 C)  Propofol: 43 ml/hr >>> 1135 fat kcals  Pt with acute on chronic hypoxic/hypercapnic respiratory failure in setting of OSA/OHS. Vital High Protein formula ordered via Adult Tube Feeding Protocol. No muscle or subcutaneous fat depletion noticed. Labs and medications reviewed. CBG's [id number].  Diet Order:  Diet NPO time specified  Skin:  Wound (see comment) (diabetic toe ulcer)  Last BM:  PTA  Height:   Ht Readings from Last 1 Encounters:  09/28/2016 5\' 11"  (1.803 m)    Weight:   Wt Readings from Last 1 Encounters:  10/15/16 (!) 466 lb (211.4 kg)    Ideal Body Weight:  78.1 kg  BMI:  Body mass index is 64.99 kg/m.  Estimated Nutritional Needs:   Kcal:  2321-2954  Protein:  >/= 195 gm  Fluid:  per MD  EDUCATION NEEDS:   No education needs identified at this time  Maureen Chatters, RD, LDN Pager #: [phone] After-Hours Pager #: [phone]

## 2016-10-15 NOTE — Progress Notes (Signed)
Pt is resting and sleeping comfortably at this time no distress or complications noted.

## 2016-10-15 NOTE — Progress Notes (Signed)
PULMONARY / CRITICAL CARE MEDICINE    ADMISSION DATE:  10/02/2016 REFERRING PROVIDER: Dr. Dalene Seltzer CHIEF COMPLAINT:  Dyspnea, confusion  HISTORY OF PRESENT ILLNESS:   65 yo male with hx of OSA/OHS and followed by cardiology.  Family reports he has not been able to get set up with PAP therapy at home.  He presented with dyspnea and altered mental status from acute on chronic hypoxic/hypercapnic respiratory failure leading to VDRF.  PMHx of CAD, AS, s/p CABG with AVR 2012, diastolic CHF, DM, HTN.  SUBJECTIVE: Sedated on vent  LE edema R>L , +redness   VITAL SIGNS: BP (!) 116/59 (BP Location: Right Arm)   Pulse 74   Temp 99.2 F (37.3 C) (Oral)   Resp 14   Ht 5\' 11"  (1.803 m)   Wt (!) 211.4 kg (466 lb)   SpO2 92%   BMI 64.99 kg/m   INTAKE / OUTPUT: I/O last 3 completed shifts: In: 3178.9 [I.V.:1597.2; NG/GT:1010; IV Piggyback:571.7] Out: 3770 [Urine:3770]  PHYSICAL EXAMINATION: General:  Obese man, intubated Neuro:  Sedated on vent, wakes to pain  HEENT: ETT, [place]/AT, PERRL, EOM-I Cardiovascular:  Distant , RRR , 1-2 +edema R>L  Lungs:  Crackles bilaterally Abdomen:  Obese but soft Musculoskeletal:  No deformities  Skin:  Stasis dermatatic changes in LE , scaling around feet   LABS:  BMET  Recent Labs Lab 10/14/16 0326 10/14/16 2031 10/15/16 0526  NA 143 135 140  K 3.4* 3.5 3.3*  CL 90* 88* 90*  CO2 34* 37* 38*  BUN 27* 31* 36*  CREATININE 1.23 1.68* 1.68*  GLUCOSE 173* 337* 322*    Electrolytes  Recent Labs Lab 10/14/16 0326 10/14/16 1239 10/14/16 1814 10/14/16 2031 10/15/16 0526  CALCIUM 9.0  --   --  8.2* 8.6*  MG  --  1.7 2.0  --  1.9  PHOS  --  <1.0* 2.4*  --  3.0    CBC  Recent Labs Lab 10/28/2016 1247 10/14/16 0326 10/15/16 0526  WBC 9.9 10.1 10.3  HGB 11.7* 10.8* 10.2*  HCT 40.4 38.2* 33.7*  PLT 281 258 252    Coag's No results for input(s): APTT, INR in the last 168 hours.  Sepsis Markers  Recent Labs Lab 10/09/2016 1300  10/10/2016 1631 10/14/16 1239 10/15/16 0526  LATICACIDVEN 1.86 0.69  --   --   PROCALCITON  --   --  <0.10 0.34    ABG  Recent Labs Lab 10/04/2016 1820 10/14/16 1013 10/15/16 0305  PHART 7.327* 7.664* 7.547*  PCO2ART 96.4* 37.2 51.2*  PO2ART 76.7* 53.0* 56.0*    Liver Enzymes  Recent Labs Lab 10/10/2016 1247  AST 18  ALT 18  ALKPHOS 78  BILITOT 0.6  ALBUMIN 3.1*    Cardiac Enzymes No results for input(s): TROPONINI, PROBNP in the last 168 hours.  Glucose  Recent Labs Lab 10/14/16 1646 10/14/16 2011 10/14/16 2356 10/15/16 0405 10/15/16 0844 10/15/16 1206  GLUCAP 299* 334* 342* 302* 322* 381*    Imaging Dg Chest Port 1 View  Result Date: 10/15/2016 CLINICAL DATA:  Intubated patient, respiratory failure, history of valvular heart disease with aortic valve replacement, chronic CHF, diabetes EXAM: PORTABLE CHEST 1 VIEW COMPARISON:  Portable chest x-ray of October 14, 2016 FINDINGS: The lung volumes remain low. The pulmonary vascularity remains engorged and the interstitial markings increased. Basilar atelectasis or pneumonia is suspected obscuring the hemidiaphragms. A moderate amount of pleural fluid likely layers posteriorly. The at there is increased density in the perihilar regions  consistent with upper lobe atelectasis or pneumonia. The cardiac silhouette is enlarged. There is calcification in the wall of the aortic arch. The endotracheal tube tip lies 4 cm above the carina. The esophagogastric tube tip projects below the inferior margin of the image. IMPRESSION: Moderate interval deterioration in the appearance of the bilateral pneumonia as well as pulmonary edema. Increased perihilar interstitial and alveolar density bilaterally is worrisome for extension of pneumonia into the upper lobes. Pleural effusions layer posteriorly, bilaterally. Electronically Signed   By: David  SwazilandJordan M.D.   On: 10/15/2016 07:50   STUDIES:  Head CT 12/16 >> no acute  findings  CULTURES: None  ANTIBIOTICS: None  SIGNIFICANT EVENTS: 12/16 Intubated in the ED after failing BiPAP  LINES/TUBES: ETT 12/16 >>   DISCUSSION: 65 y/o man with obesity hypoventilation syndrome presenting with decompensated respiratory failure.  ASSESSMENT / PLAN:  Acute on chronic hypoxic/hypercapnic respiratory failure in setting of OSA/OHS. - Full vent support - F/u CXR, ABG - PRN BDs - Decrease RR to 12 (alkalosis)  Cor pulmonale. Hx of CAD, bioprosthetic AVR, chronic diastolic CHF. - Lasix to 8 mg/hr drip. - F/u doppler legs  Anemia of critical illness. - F/u CBC  DM. - SSI  Hx of hypothyroidism. - Synthroid  Acute metabolic encephalopathy. - RASS goal 0  Sedation: - Propofol drip - PRN fentanyl  DVT prophylaxis - lovenox SUP - pepcid Nutrition - protonix Goals of care - full code  The patient is critically ill with multiple organ systems failure and requires high complexity decision making for assessment and support, frequent evaluation and titration of therapies, application of advanced monitoring technologies and extensive interpretation of multiple databases.   Critical Care Time devoted to patient care services described in this note is  35  Minutes. This time reflects time of care of this signee Dr Koren BoundWesam Yacoub. This critical care time does not reflect procedure time, or teaching time or supervisory time of PA/NP/Med student/Med Resident etc but could involve care discussion time.  Alyson ReedyWesam G. Yacoub, M.D. [facility] Pulmonary/Critical Care Medicine. Pager: [phone]. After hours pager: [phone].

## 2016-10-15 NOTE — Progress Notes (Addendum)
10/15/2016 ELink notified of elevated CBG. Will await call back from Dr. Vassie Loll.  Will continue to closely monitor patient.  Yoan Sallade, Blanchard Kelch  5:30 PM Received call back Dr. Vassie Loll. Ok to start glucostabilizer. Orders enacted.  Rasheen Bells, Blanchard Kelch

## 2016-10-15 NOTE — Progress Notes (Signed)
VASCULAR LAB PRELIMINARY  PRELIMINARY  PRELIMINARY  PRELIMINARY  Bilateral lower extremity venous duplex completed.    Preliminary report:  There is no obvious evidence of DVT or SVT noted in the visualized veins of the bilateral lower extremities.   Kassie Keng, RVT 10/15/2016, 11:06 AM

## 2016-10-15 NOTE — Progress Notes (Signed)
ABG collected. Met Alkalosis Gas.

## 2016-10-15 NOTE — Progress Notes (Signed)
Oxygen increase per ABG values. FiO2 70%. RN aware

## 2016-10-15 NOTE — Progress Notes (Addendum)
Inpatient Diabetes Program Recommendations  AACE/ADA: New Consensus Statement on Inpatient Glycemic Control (2015)  Target Ranges:  Prepandial:   less than 140 mg/dL      Peak postprandial:   less than 180 mg/dL (1-2 hours)      Critically ill patients:  140 - 180 mg/dL   Lab Results  Component Value Date   GLUCAP 322 (H) 10/15/2016   HGBA1C 10.0 (H) 02/09/2016    Review of Glycemic Control Results for [name], Antonio [name] (MRN [id number]) as of 10/15/2016 10:29  Ref. Range 10/14/2016 16:46 10/14/2016 20:11 10/14/2016 23:56 10/15/2016 04:05 10/15/2016 08:44  Glucose-Capillary Latest Ref Range: 65 - 99 mg/dL 650 (H) 354 (H) 656 (H) 302 (H) 322 (H)   Diabetes history: DM2 Outpatient Diabetes medications: Lantus 50 units bid +Novolog MC ? Units tid Current orders for Inpatient glycemic control: Novolog correction 0-9 units q 4 hrs.  Inpatient Diabetes Program Recommendations:  Noted CBGs elevated > 300.  Please consider ICU glycemic control orders. Spoke with RN Judeth Cornfield regarding recommendations.  Thank you, Billy Fischer. Archie Shea, RN, MSN, CDE Inpatient Glycemic Control Team Team Pager [phone] (8am-5pm) 10/15/2016 10:35 AM

## 2016-10-16 DIAGNOSIS — I272 Pulmonary hypertension, unspecified: Secondary | ICD-10-CM

## 2016-10-16 DIAGNOSIS — J9601 Acute respiratory failure with hypoxia: Secondary | ICD-10-CM

## 2016-10-16 DIAGNOSIS — N183 Chronic kidney disease, stage 3 (moderate): Secondary | ICD-10-CM

## 2016-10-16 LAB — GLUCOSE, CAPILLARY
GLUCOSE-CAPILLARY: 131 mg/dL — AB (ref 65–99)
GLUCOSE-CAPILLARY: 153 mg/dL — AB (ref 65–99)
GLUCOSE-CAPILLARY: 165 mg/dL — AB (ref 65–99)
GLUCOSE-CAPILLARY: 197 mg/dL — AB (ref 65–99)
GLUCOSE-CAPILLARY: 228 mg/dL — AB (ref 65–99)
GLUCOSE-CAPILLARY: 262 mg/dL — AB (ref 65–99)
GLUCOSE-CAPILLARY: 338 mg/dL — AB (ref 65–99)
Glucose-Capillary: 138 mg/dL — ABNORMAL HIGH (ref 65–99)
Glucose-Capillary: 155 mg/dL — ABNORMAL HIGH (ref 65–99)
Glucose-Capillary: 160 mg/dL — ABNORMAL HIGH (ref 65–99)
Glucose-Capillary: 178 mg/dL — ABNORMAL HIGH (ref 65–99)
Glucose-Capillary: 184 mg/dL — ABNORMAL HIGH (ref 65–99)
Glucose-Capillary: 187 mg/dL — ABNORMAL HIGH (ref 65–99)
Glucose-Capillary: 231 mg/dL — ABNORMAL HIGH (ref 65–99)
Glucose-Capillary: 289 mg/dL — ABNORMAL HIGH (ref 65–99)
Glucose-Capillary: 328 mg/dL — ABNORMAL HIGH (ref 65–99)
Glucose-Capillary: 354 mg/dL — ABNORMAL HIGH (ref 65–99)

## 2016-10-16 LAB — BASIC METABOLIC PANEL
ANION GAP: 12 (ref 5–15)
BUN: 43 mg/dL — AB (ref 6–20)
CALCIUM: 8.7 mg/dL — AB (ref 8.9–10.3)
CO2: 40 mmol/L — ABNORMAL HIGH (ref 22–32)
Chloride: 93 mmol/L — ABNORMAL LOW (ref 101–111)
Creatinine, Ser: 1.42 mg/dL — ABNORMAL HIGH (ref 0.61–1.24)
GFR calc Af Amer: 58 mL/min — ABNORMAL LOW (ref >60)
GFR, EST NON AFRICAN AMERICAN: 50 mL/min — AB (ref >60)
GLUCOSE: 132 mg/dL — AB (ref 65–99)
Potassium: 3.2 mmol/L — ABNORMAL LOW (ref 3.5–5.1)
Sodium: 145 mmol/L (ref 135–145)

## 2016-10-16 LAB — CBC
HCT: 35.3 % — ABNORMAL LOW (ref 39.0–52.0)
Hemoglobin: 10.4 g/dL — ABNORMAL LOW (ref 13.0–17.0)
MCH: 26.2 pg (ref 26.0–34.0)
MCHC: 29.5 g/dL — ABNORMAL LOW (ref 30.0–36.0)
MCV: 88.9 fL (ref 78.0–100.0)
Platelets: 245 10*3/uL (ref 150–400)
RBC: 3.97 MIL/uL — ABNORMAL LOW (ref 4.22–5.81)
RDW: 15.1 % (ref 11.5–15.5)
WBC: 11 10*3/uL — AB (ref 4.0–10.5)

## 2016-10-16 LAB — PROCALCITONIN: PROCALCITONIN: 0.34 ng/mL

## 2016-10-16 LAB — TRIGLYCERIDES: TRIGLYCERIDES: 164 mg/dL — AB (ref <150)

## 2016-10-16 MED ORDER — SODIUM CHLORIDE 0.9 % IV SOLN
INTRAVENOUS | Status: AC
  Filled 2016-10-16: qty 2.5

## 2016-10-16 MED ORDER — ACETAZOLAMIDE SODIUM 500 MG IJ SOLR
250.0000 mg | Freq: Four times a day (QID) | INTRAMUSCULAR | Status: AC
  Administered 2016-10-16 (×3): 250 mg via INTRAVENOUS
  Filled 2016-10-16 (×3): qty 250

## 2016-10-16 MED ORDER — INSULIN ASPART 100 UNIT/ML ~~LOC~~ SOLN
2.0000 [IU] | SUBCUTANEOUS | Status: DC
  Administered 2016-10-16: 4 [IU] via SUBCUTANEOUS
  Administered 2016-10-16: 6 [IU] via SUBCUTANEOUS

## 2016-10-16 MED ORDER — SODIUM CHLORIDE 0.9 % IV SOLN
INTRAVENOUS | Status: DC
  Administered 2016-10-16: 2.3 [IU]/h via INTRAVENOUS
  Administered 2016-10-18: 8.7 [IU]/h via INTRAVENOUS
  Administered 2016-10-19: 3.3 [IU]/h via INTRAVENOUS
  Administered 2016-10-20: 18:00:00 via INTRAVENOUS
  Administered 2016-10-21: 8.6 [IU]/h via INTRAVENOUS
  Administered 2016-10-22: 6.7 [IU]/h via INTRAVENOUS
  Administered 2016-10-23: 14:00:00 via INTRAVENOUS
  Filled 2016-10-16 (×9): qty 2.5

## 2016-10-16 MED ORDER — POTASSIUM CHLORIDE 20 MEQ/15ML (10%) PO SOLN
40.0000 meq | Freq: Three times a day (TID) | ORAL | Status: AC
  Administered 2016-10-16 (×2): 40 meq
  Filled 2016-10-16 (×2): qty 30

## 2016-10-16 MED ORDER — FUROSEMIDE 10 MG/ML IJ SOLN
10.0000 mg/h | INTRAVENOUS | Status: AC
  Administered 2016-10-16: 10 mg/h via INTRAVENOUS
  Filled 2016-10-16 (×2): qty 25

## 2016-10-16 MED ORDER — SODIUM CHLORIDE 0.9% FLUSH: 10.0000 mL | INTRAVENOUS | Status: DC | PRN

## 2016-10-16 MED ORDER — STERILE WATER FOR INJECTION IJ SOLN
INTRAMUSCULAR | Status: AC
  Administered 2016-10-16: 5 mL
  Filled 2016-10-16: qty 10

## 2016-10-16 MED ORDER — INSULIN GLARGINE 100 UNIT/ML ~~LOC~~ SOLN
7.0000 [IU] | Freq: Two times a day (BID) | SUBCUTANEOUS | Status: DC
  Administered 2016-10-17 – 2016-10-18 (×3): 7 [IU] via SUBCUTANEOUS
  Filled 2016-10-16 (×4): qty 0.07

## 2016-10-16 MED ORDER — INSULIN GLARGINE 100 UNIT/ML ~~LOC~~ SOLN
5.0000 [IU] | Freq: Two times a day (BID) | SUBCUTANEOUS | Status: DC
  Administered 2016-10-16: 5 [IU] via SUBCUTANEOUS
  Filled 2016-10-16 (×2): qty 0.05

## 2016-10-16 MED ORDER — METOLAZONE 5 MG PO TABS
5.0000 mg | ORAL_TABLET | Freq: Every day | ORAL | Status: AC
  Administered 2016-10-16: 5 mg via ORAL
  Filled 2016-10-16: qty 1

## 2016-10-16 NOTE — Progress Notes (Signed)
PULMONARY / CRITICAL CARE MEDICINE    ADMISSION DATE:  10/23/2016 REFERRING PROVIDER: Dr. Dalene SeltzerSchlossman CHIEF COMPLAINT:  Dyspnea, confusion  HISTORY OF PRESENT ILLNESS:   65 yo male with hx of OSA/OHS and followed by cardiology.  Family reports he has not been able to get set up with PAP therapy at home.  He presented with dyspnea and altered mental status from acute on chronic hypoxic/hypercapnic respiratory failure leading to VDRF.  PMHx of CAD, AS, s/p CABG with AVR 2012, diastolic CHF, DM, HTN.  SUBJECTIVE: Failed wean due to desaturation.  VITAL SIGNS: BP 122/64   Pulse 76   Temp 100.2 F (37.9 C) (Oral)   Resp 16   Ht 5\' 11"  (1.803 m)   Wt (!) 203.7 kg (449 lb)   SpO2 90%   BMI 62.62 kg/m   INTAKE / OUTPUT: I/O last 3 completed shifts: In: 4011.2 [I.V.:1761.2; NG/GT:2250] Out: 4870 [Urine:4870]  PHYSICAL EXAMINATION: General:  Obese man, intubated, agitated Neuro:  Agitated on vent, moves all ext spontaneously HEENT: ETT, [place]/AT, PERRL, EOM-I Cardiovascular:  Distant , RRR , 1-2 +edema R>L  Lungs:  Crackles bilaterally, decreased BS in all lung fields even  On full support. Abdomen:  Obese, soft, NT, ND and +BS Musculoskeletal:  No deformities  Skin:  Stasis dermatatic changes in LE, scaling around feet, diffuse edema  LABS:  BMET  Recent Labs Lab 10/14/16 2031 10/15/16 0526 10/16/16 0745  NA 135 140 145  K 3.5 3.3* 3.2*  CL 88* 90* 93*  CO2 37* 38* 40*  BUN 31* 36* 43*  CREATININE 1.68* 1.68* 1.42*  GLUCOSE 337* 322* 132*   Electrolytes  Recent Labs Lab 10/14/16 1814 10/14/16 2031 10/15/16 0526 10/15/16 1652 10/16/16 0745  CALCIUM  --  8.2* 8.6*  --  8.7*  MG 2.0  --  1.9 2.2  --   PHOS 2.4*  --  3.0 3.4  --    CBC  Recent Labs Lab 10/14/16 0326 10/15/16 0526 10/16/16 0745  WBC 10.1 10.3 11.0*  HGB 10.8* 10.2* 10.4*  HCT 38.2* 33.7* 35.3*  PLT 258 252 245   Coag's No results for input(s): APTT, INR in the last 168  hours.  Sepsis Markers  Recent Labs Lab 10/07/2016 1300 10/21/2016 1631 10/14/16 1239 10/15/16 0526 10/16/16 0745  LATICACIDVEN 1.86 0.69  --   --   --   PROCALCITON  --   --  <0.10 0.34 0.34   ABG  Recent Labs Lab 10/21/2016 1820 10/14/16 1013 10/15/16 0305  PHART 7.327* 7.664* 7.547*  PCO2ART 96.4* 37.2 51.2*  PO2ART 76.7* 53.0* 56.0*   Liver Enzymes  Recent Labs Lab 10/28/2016 1247  AST 18  ALT 18  ALKPHOS 78  BILITOT 0.6  ALBUMIN 3.1*   Cardiac Enzymes No results for input(s): TROPONINI, PROBNP in the last 168 hours.  Glucose  Recent Labs Lab 10/16/16 0256 10/16/16 0359 10/16/16 0500 10/16/16 0601 10/16/16 0700 10/16/16 0748  GLUCAP 184* 153* 165* 155* 138* 131*   Imaging No results found.  STUDIES:  Head CT 12/16 >> no acute findings  CULTURES: None  ANTIBIOTICS: None  SIGNIFICANT EVENTS: 12/16 Intubated in the ED after failing BiPAP  LINES/TUBES: ETT 12/16 >>   DISCUSSION: 65 y/o man with obesity hypoventilation syndrome presenting with decompensated respiratory failure.  ASSESSMENT / PLAN:  Acute on chronic hypoxic/hypercapnic respiratory failure in setting of OSA/OHS. - Full vent support - failed weaning - Active diureses to be continued - F/u CXR, ABG - PRN  BDs - Decrease RR to 12 (alkalosis)  Cor pulmonale. Hx of CAD, bioprosthetic AVR, chronic diastolic CHF. - Lasix to 10 mg/hr drip. - Zaroxolyn 5 mg PO x1 - Acetazolamide 250 mg IV q6 x3 doses - F/u doppler legs negative for DVT  Anemia of critical illness. - F/u CBC  DM. - SSI - Add lantus 5 units SQ BID starting now.  Hx of hypothyroidism. - Synthroid  Acute metabolic encephalopathy. - RASS goal 0  Sedation: - D/C propofol drip - PRN versed - Fentanyl drip  DVT prophylaxis - lovenox SUP - pepcid Nutrition - protonix Goals of care - full code  Daughter updated at length bedside, approached the code status and trach topic, they will discuss tonight and  will speak in AM.  The patient is critically ill with multiple organ systems failure and requires high complexity decision making for assessment and support, frequent evaluation and titration of therapies, application of advanced monitoring technologies and extensive interpretation of multiple databases.   Critical Care Time devoted to patient care services described in this note is  35  Minutes. This time reflects time of care of this signee Dr Koren Bound. This critical care time does not reflect procedure time, or teaching time or supervisory time of PA/NP/Med student/Med Resident etc but could involve care discussion time.  Alyson Reedy, M.D. [facility] Pulmonary/Critical Care Medicine. Pager: [phone]. After hours pager: [phone].

## 2016-10-16 NOTE — Progress Notes (Signed)
Peripherally Inserted Central Catheter/Midline Placement  The IV Nurse has discussed with the patient and/or persons authorized to consent for the patient, the purpose of this procedure and the potential benefits and risks involved with this procedure.  The benefits include less needle sticks, lab draws from the catheter, and the patient may be discharged home with the catheter. Risks include, but not limited to, infection, bleeding, blood clot (thrombus formation), and puncture of an artery; nerve damage and irregular heartbeat and possibility to perform a PICC exchange if needed/ordered by physician.  Alternatives to this procedure were also discussed.  Bard Power PICC patient education guide, fact sheet on infection prevention and patient information card has been provided to patient /or left at bedside.    PICC/Midline Placement Documentation  PICC Triple Lumen 10/16/16 PICC Right Cephalic 42 cm 1 cm (Active)  Indication for Insertion or Continuance of Line Poor Vasculature-patient has had multiple peripheral attempts or PIVs lasting less than 24 hours 10/16/2016  1:00 PM  Exposed Catheter (cm) 1 cm 10/16/2016  1:00 PM  Dressing Change Due 10/23/16 10/16/2016  1:00 PM     Consent signed by daughter  Consuello Masse 10/16/2016, 1:29 PM

## 2016-10-16 NOTE — Progress Notes (Signed)
RT called to patient room due to patient having a decrease in sats to the 70s.  RN had given 100% O2 breath prior to arrival to room and sats were reading 83%.  Suctioned patient and obtained a copious amount of thick, yellow secretions.  Performed a recruitment maneuver and sats improved to 97% during maneuver however once patient placed back on original ventilator mode sats dropped to 92%.  Increased FIO2 to 50% and sats maintaining in 90s.  Will continue to monitor.

## 2016-10-16 NOTE — Progress Notes (Signed)
A triple lumen PICC was placed per MD order. Please consider an exchange for a double or single lumen PICC when appropriate to decrease risk for CLABSI. Consuello Masse

## 2016-10-16 NOTE — Progress Notes (Signed)
eLink Physician-Brief Progress Note Patient Name: Antonio [name] DOB: 1951-05-18 MRN: [id number]   Date of Service  10/16/2016  HPI/Events of Note  BG>180mg /dl  eICU Interventions  Inc lantus to 7units BID     Intervention Category Major Interventions: Hyperglycemia - active titration of insulin therapy  Tonae Livolsi 10/16/2016, 3:47 PM

## 2016-10-16 NOTE — Progress Notes (Signed)
CSW was contacted by case manager Sam for a possibly SNF referral for patient. Same stated that family is at bedside and would like to speak to CSW about SNF, as well as be proactive for patients discharge needs.   CSW met patients two daughters and ex-wife outside patients room. Daughter Antonio [name] and Antonio [name] stated that they were concern about his discharge to SNF facility because during his lat stay at [facility] patient was unable to use his c-pap machine. Daughter stated that they tired for months to have facility place the patient on machine but facility stated that the patient would need a new sleep study since it has been several years since settings have been updated. CSW encouraged daughters to speak to physician in regards to sleep study. Family asked for information on SNF in the area and CSW will provide them with a list. Family stated that if patient does discharge to SNF their first choice will be [facility], in hope of receiving better care for patient. CSW will remaisn available for support and any discharge needs once plan becomes clearer.   Rhea Pink, MSW,  [place]

## 2016-10-16 NOTE — Care Management Note (Addendum)
Case Management Note  Patient Details  Name: Antonio [name] MRN: [id number] Date of Birth: 08-01-1951  Subjective/Objective: Admitted with SOB                   Action/Plan:  PTA from home with part time caregiver.  Daughters are out of town but are very attentive.  Joannie requested to be SPOC (has been in [place] 6 weeks from [place] to take care of dad).  Pt discharged to [facility] on last admit and then subsequently discharged home.  Daughters relayed concern that pt has still not received CPAP ordered during last admit.   Daughters state that pt is not safe to discharge home and are open to SNF placement.  CSW consulted for tentative consult and to follow up with CPAP requirement at  SNF.  Pt is now on ventilator and PT will be ordered post extubation.  Attending spoke with family in detail regarding possible trach.  CM will continue to follow for discharge needs   Expected Discharge Date:                  Expected Discharge Plan:  Skilled Nursing Facility  In-House Referral:  Clinical Social Work  Discharge planning Services  CM Consult  Post Acute Care Choice:    Choice offered to:     DME Arranged:    DME Agency:     HH Arranged:    HH Agency:     Status of Service:  In process, will continue to follow  If discussed at Long Length of Stay Meetings, dates discussed:    Additional Comments: CM left physician sticky note regarding CPAP concern. Cherylann Parr, RN 10/16/2016, 10:52 AM

## 2016-10-17 ENCOUNTER — Inpatient Hospital Stay (HOSPITAL_COMMUNITY): Payer: Medicare Other

## 2016-10-17 LAB — BASIC METABOLIC PANEL
ANION GAP: 11 (ref 5–15)
Anion gap: 12 (ref 5–15)
BUN: 61 mg/dL — ABNORMAL HIGH (ref 6–20)
BUN: 62 mg/dL — ABNORMAL HIGH (ref 6–20)
CALCIUM: 8.8 mg/dL — AB (ref 8.9–10.3)
CALCIUM: 8.4 mg/dL — AB (ref 8.9–10.3)
CO2: 42 mmol/L — AB (ref 22–32)
CO2: 40 mmol/L — ABNORMAL HIGH (ref 22–32)
CREATININE: 1.63 mg/dL — AB (ref 0.61–1.24)
CREATININE: 1.69 mg/dL — AB (ref 0.61–1.24)
Chloride: 93 mmol/L — ABNORMAL LOW (ref 101–111)
Chloride: 94 mmol/L — ABNORMAL LOW (ref 101–111)
GFR calc Af Amer: 49 mL/min — ABNORMAL LOW (ref >60)
GFR, EST AFRICAN AMERICAN: 47 mL/min — AB (ref >60)
GFR, EST NON AFRICAN AMERICAN: 42 mL/min — AB (ref >60)
GFR, EST NON AFRICAN AMERICAN: 41 mL/min — AB (ref >60)
GLUCOSE: 182 mg/dL — AB (ref 65–99)
GLUCOSE: 188 mg/dL — AB (ref 65–99)
Potassium: 2.8 mmol/L — ABNORMAL LOW (ref 3.5–5.1)
Potassium: 3 mmol/L — ABNORMAL LOW (ref 3.5–5.1)
Sodium: 147 mmol/L — ABNORMAL HIGH (ref 135–145)
Sodium: 145 mmol/L (ref 135–145)

## 2016-10-17 LAB — CBC
HEMATOCRIT: 36 % — AB (ref 39.0–52.0)
Hemoglobin: 10.1 g/dL — ABNORMAL LOW (ref 13.0–17.0)
MCH: 26.3 pg (ref 26.0–34.0)
MCHC: 28.1 g/dL — AB (ref 30.0–36.0)
MCV: 93.8 fL (ref 78.0–100.0)
PLATELETS: 241 10*3/uL (ref 150–400)
RBC: 3.84 MIL/uL — ABNORMAL LOW (ref 4.22–5.81)
RDW: 15.8 % — AB (ref 11.5–15.5)
WBC: 11.4 10*3/uL — ABNORMAL HIGH (ref 4.0–10.5)

## 2016-10-17 LAB — GLUCOSE, CAPILLARY
GLUCOSE-CAPILLARY: 312 mg/dL — AB (ref 65–99)
GLUCOSE-CAPILLARY: 154 mg/dL — AB (ref 65–99)
GLUCOSE-CAPILLARY: 159 mg/dL — AB (ref 65–99)
GLUCOSE-CAPILLARY: 182 mg/dL — AB (ref 65–99)
GLUCOSE-CAPILLARY: 199 mg/dL — AB (ref 65–99)
GLUCOSE-CAPILLARY: 170 mg/dL — AB (ref 65–99)
GLUCOSE-CAPILLARY: 178 mg/dL — AB (ref 65–99)
GLUCOSE-CAPILLARY: 117 mg/dL — AB (ref 65–99)
GLUCOSE-CAPILLARY: 140 mg/dL — AB (ref 65–99)
GLUCOSE-CAPILLARY: 107 mg/dL — AB (ref 65–99)
Glucose-Capillary: 119 mg/dL — ABNORMAL HIGH (ref 65–99)
Glucose-Capillary: 122 mg/dL — ABNORMAL HIGH (ref 65–99)
Glucose-Capillary: 131 mg/dL — ABNORMAL HIGH (ref 65–99)
Glucose-Capillary: 138 mg/dL — ABNORMAL HIGH (ref 65–99)
Glucose-Capillary: 147 mg/dL — ABNORMAL HIGH (ref 65–99)
Glucose-Capillary: 153 mg/dL — ABNORMAL HIGH (ref 65–99)
Glucose-Capillary: 157 mg/dL — ABNORMAL HIGH (ref 65–99)
Glucose-Capillary: 161 mg/dL — ABNORMAL HIGH (ref 65–99)
Glucose-Capillary: 163 mg/dL — ABNORMAL HIGH (ref 65–99)
Glucose-Capillary: 192 mg/dL — ABNORMAL HIGH (ref 65–99)
Glucose-Capillary: 195 mg/dL — ABNORMAL HIGH (ref 65–99)
Glucose-Capillary: 197 mg/dL — ABNORMAL HIGH (ref 65–99)
Glucose-Capillary: 246 mg/dL — ABNORMAL HIGH (ref 65–99)
Glucose-Capillary: 259 mg/dL — ABNORMAL HIGH (ref 65–99)

## 2016-10-17 LAB — MAGNESIUM: Magnesium: 2.3 mg/dL (ref 1.7–2.4)

## 2016-10-17 LAB — BLOOD GAS, ARTERIAL
Acid-Base Excess: 16.8 mmol/L — ABNORMAL HIGH (ref 0.0–2.0)
Bicarbonate: 42.6 mmol/L — ABNORMAL HIGH (ref 20.0–28.0)
DRAWN BY: 44166
FIO2: 50
MECHVT: 0.6 mL
O2 SAT: 91.6 %
PATIENT TEMPERATURE: 98.9
PCO2 ART: 70.2 mmHg — AB (ref 32.0–48.0)
PEEP: 5 cmH2O
PH ART: 7.4 (ref 7.350–7.450)
PO2 ART: 68.5 mmHg — AB (ref 83.0–108.0)
RATE: 12 resp/min

## 2016-10-17 LAB — PHOSPHORUS: Phosphorus: 5.1 mg/dL — ABNORMAL HIGH (ref 2.5–4.6)

## 2016-10-17 MED ORDER — METOLAZONE 5 MG PO TABS
5.0000 mg | ORAL_TABLET | Freq: Every day | ORAL | Status: AC
  Administered 2016-10-17: 5 mg via ORAL
  Filled 2016-10-17: qty 1

## 2016-10-17 MED ORDER — POTASSIUM CHLORIDE 20 MEQ/15ML (10%) PO SOLN
60.0000 meq | Freq: Once | ORAL | Status: AC
  Administered 2016-10-17: 60 meq
  Filled 2016-10-17: qty 45

## 2016-10-17 MED ORDER — FUROSEMIDE 10 MG/ML IJ SOLN
10.0000 mg/h | INTRAVENOUS | Status: AC
  Filled 2016-10-17: qty 25

## 2016-10-17 MED ORDER — POTASSIUM CHLORIDE 20 MEQ/15ML (10%) PO SOLN
40.0000 meq | Freq: Three times a day (TID) | ORAL | Status: AC
  Administered 2016-10-17 (×2): 40 meq
  Filled 2016-10-17 (×2): qty 30

## 2016-10-17 MED ORDER — ALBUMIN HUMAN 25 % IV SOLN
25.0000 g | Freq: Four times a day (QID) | INTRAVENOUS | Status: AC
  Administered 2016-10-17 – 2016-10-18 (×3): 25 g via INTRAVENOUS
  Filled 2016-10-17: qty 100
  Filled 2016-10-17: qty 50
  Filled 2016-10-17: qty 100
  Filled 2016-10-17 (×2): qty 50

## 2016-10-17 MED ORDER — ACETAZOLAMIDE SODIUM 500 MG IJ SOLR
250.0000 mg | Freq: Four times a day (QID) | INTRAMUSCULAR | Status: AC
  Administered 2016-10-17 – 2016-10-18 (×3): 250 mg via INTRAVENOUS
  Filled 2016-10-17 (×3): qty 250

## 2016-10-17 NOTE — Progress Notes (Signed)
eLink Physician-Brief Progress Note Patient Name: Antonio [name] DOB: 05-29-1951 MRN: [id number]   Date of Service  10/17/2016  HPI/Events of Note  Hypokalemia CKD Borderline UOP  eICU Interventions  Attenuated K replacement with repeat BMET     Intervention Category Major Interventions: Electrolyte abnormality - evaluation and management  Max Fickle 10/17/2016, 5:38 AM

## 2016-10-17 NOTE — Care Management Important Message (Signed)
Important Message  Patient Details  Name: Antonio [name] MRN: [id number] Date of Birth: [birth year]   Medicare Important Message Given:  Yes    Kyla Balzarine 10/17/2016, 10:24 AM

## 2016-10-17 NOTE — Progress Notes (Signed)
Inpatient Diabetes Program Recommendations  AACE/ADA: New Consensus Statement on Inpatient Glycemic Control (2015)  Target Ranges:  Prepandial:   less than 140 mg/dL      Peak postprandial:   less than 180 mg/dL (1-2 hours)      Critically ill patients:  140 - 180 mg/dL   Lab Results  Component Value Date   GLUCAP 161 (H) 10/17/2016   HGBA1C 10.0 (H) 02/09/2016    Review of Glycemic Control Inpatient Diabetes Program Recommendations:  Patient is on Lantus 50 units bid @ home.  Please consider increase in Lantus 25 units bid (50% of home dose)when coming off of IV insulin.  Thank you, Billy Fischer. Amrit Erck, RN, MSN, CDE Inpatient Glycemic Control Team Team Pager [phone] (8am-5pm) 10/17/2016 10:29 AM

## 2016-10-17 NOTE — Progress Notes (Signed)
PULMONARY / CRITICAL CARE MEDICINE    ADMISSION DATE:  10/10/2016 REFERRING PROVIDER: Dr. Dalene SeltzerSchlossman CHIEF COMPLAINT:  Dyspnea, confusion  HISTORY OF PRESENT ILLNESS:   65 yo male with hx of OSA/OHS and followed by cardiology.  Family reports he has not been able to get set up with PAP therapy at home.  He presented with dyspnea and altered mental status from acute on chronic hypoxic/hypercapnic respiratory failure leading to VDRF.  PMHx of CAD, AS, s/p CABG with AVR 2012, diastolic CHF, DM, HTN.  SUBJECTIVE: No events overnight,  Failed weaning this AM.  VITAL SIGNS: BP 118/70   Pulse 70   Temp 98.9 F (37.2 C) (Oral)   Resp (!) 5   Ht 5\' 11"  (1.803 m)   Wt (!) 201.4 kg (444 lb)   SpO2 90%   BMI 61.93 kg/m   INTAKE / OUTPUT: I/O last 3 completed shifts: In: 4113 [I.V.:1813; NG/GT:2300] Out: 6300 [Urine:6300]  PHYSICAL EXAMINATION: General:  Obese man, intubated Neuro:  Sedated on vent, wakes to stimulation and follows commands HEENT: ETT, [place]/AT, PERRL, EOM-I Cardiovascular:  Distant , RRR , 1-2 +edema R>L  Lungs:  Crackles bilaterally Abdomen:  Obese but soft Musculoskeletal:  No deformities  Skin:  Stasis dermatatic changes in LE , scaling around feet   LABS:  BMET  Recent Labs Lab 10/15/16 0526 10/16/16 0745 10/17/16 0425  NA 140 145 147*  K 3.3* 3.2* 2.8*  CL 90* 93* 93*  CO2 38* 40* 42*  BUN 36* 43* 61*  CREATININE 1.68* 1.42* 1.63*  GLUCOSE 322* 132* 182*    Electrolytes  Recent Labs Lab 10/15/16 0526 10/15/16 1652 10/16/16 0745 10/17/16 0425  CALCIUM 8.6*  --  8.7* 8.8*  MG 1.9 2.2  --  2.3  PHOS 3.0 3.4  --  5.1*    CBC  Recent Labs Lab 10/15/16 0526 10/16/16 0745 10/17/16 0425  WBC 10.3 11.0* 11.4*  HGB 10.2* 10.4* 10.1*  HCT 33.7* 35.3* 36.0*  PLT 252 245 241    Coag's No results for input(s): APTT, INR in the last 168 hours.  Sepsis Markers  Recent Labs Lab 10/23/2016 1300 10/12/2016 1631 10/14/16 1239  10/15/16 0526 10/16/16 0745  LATICACIDVEN 1.86 0.69  --   --   --   PROCALCITON  --   --  <0.10 0.34 0.34    ABG  Recent Labs Lab 10/14/16 1013 10/15/16 0305 10/17/16 0340  PHART 7.664* 7.547* 7.400  PCO2ART 37.2 51.2* 70.2*  PO2ART 53.0* 56.0* 68.5*    Liver Enzymes  Recent Labs Lab 10/19/2016 1247  AST 18  ALT 18  ALKPHOS 78  BILITOT 0.6  ALBUMIN 3.1*    Cardiac Enzymes No results for input(s): TROPONINI, PROBNP in the last 168 hours.  Glucose  Recent Labs Lab 10/17/16 0305 10/17/16 0400 10/17/16 0551 10/17/16 0628 10/17/16 0806 10/17/16 0904  GLUCAP 246* 192* 163* 154* 147* 161*    Imaging Dg Chest Port 1 View  Result Date: 10/17/2016 CLINICAL DATA:  Low oxygen saturation EXAM: PORTABLE CHEST 1 VIEW COMPARISON:  10/15/2016 FINDINGS: Endotracheal and NG tubes are stable. Right-sided central venous catheter is poorly visualized. Lungs are very under aerated. Opacities are seen throughout both lungs with relative sparing of the left upper lung zone. The cardiac silhouette is obscured. IMPRESSION: Very low lung volumes with bilateral airspace disease. Pleural effusions cannot be excluded. There has been no significant change. Electronically Signed   By: Jolaine ClickArthur  Hoss M.D.   On: 10/17/2016 08:46  STUDIES:  Head CT 12/16 >> no acute findings  CULTURES: None  ANTIBIOTICS: None  SIGNIFICANT EVENTS: 12/16 Intubated in the ED after failing BiPAP  LINES/TUBES: ETT 12/16 >>   DISCUSSION: 65 y/o man with obesity hypoventilation syndrome presenting with decompensated respiratory failure.  ASSESSMENT / PLAN:  Acute on chronic hypoxic/hypercapnic respiratory failure in setting of OSA/OHS. - Full vent support - F/u CXR, ABG - PRN BDs - Acetazolamide for metabolic alkalosis - Family discussing trach/peg.  Cor pulmonale. Hx of CAD, bioprosthetic AVR, chronic diastolic CHF. - Lasix to 10 mg/hr drip. - Zaroxolyn 5 mg PO x1 - F/u doppler legs  Anemia  of critical illness. - F/u CBC  DM. - SSI  Hx of hypothyroidism. - Synthroid  Acute metabolic encephalopathy. - RASS goal 0  Sedation: - Fentanyl drip - PRN versed  DVT prophylaxis - lovenox SUP - pepcid Nutrition - protonix Goals of care - full code  Spoke with daughters at length, full code status for now.  The patient is critically ill with multiple organ systems failure and requires high complexity decision making for assessment and support, frequent evaluation and titration of therapies, application of advanced monitoring technologies and extensive interpretation of multiple databases.   Critical Care Time devoted to patient care services described in this note is  35  Minutes. This time reflects time of care of this signee Dr Koren Bound. This critical care time does not reflect procedure time, or teaching time or supervisory time of PA/NP/Med student/Med Resident etc but could involve care discussion time.  Alyson Reedy, M.D. [facility] Pulmonary/Critical Care Medicine. Pager: [phone]. After hours pager: [phone].

## 2016-10-18 ENCOUNTER — Inpatient Hospital Stay (HOSPITAL_COMMUNITY): Payer: Medicare Other

## 2016-10-18 DIAGNOSIS — E119 Type 2 diabetes mellitus without complications: Secondary | ICD-10-CM

## 2016-10-18 DIAGNOSIS — Z794 Long term (current) use of insulin: Secondary | ICD-10-CM

## 2016-10-18 LAB — GLUCOSE, CAPILLARY
GLUCOSE-CAPILLARY: 213 mg/dL — AB (ref 65–99)
GLUCOSE-CAPILLARY: 112 mg/dL — AB (ref 65–99)
GLUCOSE-CAPILLARY: 232 mg/dL — AB (ref 65–99)
GLUCOSE-CAPILLARY: 186 mg/dL — AB (ref 65–99)
GLUCOSE-CAPILLARY: 178 mg/dL — AB (ref 65–99)
GLUCOSE-CAPILLARY: 195 mg/dL — AB (ref 65–99)
GLUCOSE-CAPILLARY: 137 mg/dL — AB (ref 65–99)
GLUCOSE-CAPILLARY: 118 mg/dL — AB (ref 65–99)
GLUCOSE-CAPILLARY: 138 mg/dL — AB (ref 65–99)
GLUCOSE-CAPILLARY: 122 mg/dL — AB (ref 65–99)
GLUCOSE-CAPILLARY: 144 mg/dL — AB (ref 65–99)
GLUCOSE-CAPILLARY: 126 mg/dL — AB (ref 65–99)
Glucose-Capillary: 115 mg/dL — ABNORMAL HIGH (ref 65–99)
Glucose-Capillary: 127 mg/dL — ABNORMAL HIGH (ref 65–99)
Glucose-Capillary: 127 mg/dL — ABNORMAL HIGH (ref 65–99)
Glucose-Capillary: 140 mg/dL — ABNORMAL HIGH (ref 65–99)
Glucose-Capillary: 142 mg/dL — ABNORMAL HIGH (ref 65–99)
Glucose-Capillary: 144 mg/dL — ABNORMAL HIGH (ref 65–99)
Glucose-Capillary: 149 mg/dL — ABNORMAL HIGH (ref 65–99)
Glucose-Capillary: 160 mg/dL — ABNORMAL HIGH (ref 65–99)
Glucose-Capillary: 193 mg/dL — ABNORMAL HIGH (ref 65–99)

## 2016-10-18 LAB — BASIC METABOLIC PANEL
Anion gap: 9 (ref 5–15)
BUN: 73 mg/dL — ABNORMAL HIGH (ref 6–20)
CO2: 42 mmol/L — AB (ref 22–32)
CREATININE: 1.77 mg/dL — AB (ref 0.61–1.24)
Calcium: 8.5 mg/dL — ABNORMAL LOW (ref 8.9–10.3)
Chloride: 94 mmol/L — ABNORMAL LOW (ref 101–111)
GFR calc non Af Amer: 38 mL/min — ABNORMAL LOW (ref >60)
GFR, EST AFRICAN AMERICAN: 44 mL/min — AB (ref >60)
GLUCOSE: 238 mg/dL — AB (ref 65–99)
Potassium: 2.7 mmol/L — CL (ref 3.5–5.1)
Sodium: 145 mmol/L (ref 135–145)

## 2016-10-18 LAB — BLOOD GAS, ARTERIAL
ACID-BASE EXCESS: 19.4 mmol/L — AB (ref 0.0–2.0)
Bicarbonate: 45.6 mmol/L — ABNORMAL HIGH (ref 20.0–28.0)
DRAWN BY: 42624
FIO2: 50
O2 SAT: 86.2 %
PEEP/CPAP: 5 cmH2O
PH ART: 7.387 (ref 7.350–7.450)
Patient temperature: 98.6
RATE: 12 resp/min
VT: 600 mL
pCO2 arterial: 77.5 mmHg (ref 32.0–48.0)
pO2, Arterial: 58 mmHg — ABNORMAL LOW (ref 83.0–108.0)

## 2016-10-18 LAB — CBC
HEMATOCRIT: 32.2 % — AB (ref 39.0–52.0)
HEMOGLOBIN: 9.2 g/dL — AB (ref 13.0–17.0)
MCH: 26.7 pg (ref 26.0–34.0)
MCHC: 28.6 g/dL — AB (ref 30.0–36.0)
MCV: 93.6 fL (ref 78.0–100.0)
Platelets: 268 10*3/uL (ref 150–400)
RBC: 3.44 MIL/uL — ABNORMAL LOW (ref 4.22–5.81)
RDW: 15.4 % (ref 11.5–15.5)
WBC: 12 10*3/uL — ABNORMAL HIGH (ref 4.0–10.5)

## 2016-10-18 LAB — MAGNESIUM: Magnesium: 2.4 mg/dL (ref 1.7–2.4)

## 2016-10-18 LAB — PHOSPHORUS: PHOSPHORUS: 4.2 mg/dL (ref 2.5–4.6)

## 2016-10-18 LAB — POTASSIUM: POTASSIUM: 3 mmol/L — AB (ref 3.5–5.1)

## 2016-10-18 MED ORDER — INSULIN GLARGINE 100 UNIT/ML ~~LOC~~ SOLN
15.0000 [IU] | Freq: Two times a day (BID) | SUBCUTANEOUS | Status: DC
  Administered 2016-10-18 – 2016-10-19 (×3): 15 [IU] via SUBCUTANEOUS
  Filled 2016-10-18 (×3): qty 0.15

## 2016-10-18 MED ORDER — ACETAZOLAMIDE SODIUM 500 MG IJ SOLR
250.0000 mg | Freq: Four times a day (QID) | INTRAMUSCULAR | Status: AC
  Administered 2016-10-18 – 2016-10-19 (×3): 250 mg via INTRAVENOUS
  Filled 2016-10-18 (×3): qty 250

## 2016-10-18 MED ORDER — ENOXAPARIN SODIUM 100 MG/ML ~~LOC~~ SOLN
90.0000 mg | SUBCUTANEOUS | Status: DC
  Administered 2016-10-19 – 2016-10-21 (×3): 90 mg via SUBCUTANEOUS
  Filled 2016-10-18 (×3): qty 0.9

## 2016-10-18 MED ORDER — VITAL HIGH PROTEIN PO LIQD
1000.0000 mL | ORAL | Status: DC
  Administered 2016-10-19 – 2016-10-24 (×6): 1000 mL

## 2016-10-18 MED ORDER — POTASSIUM CHLORIDE 20 MEQ/15ML (10%) PO SOLN
60.0000 meq | Freq: Once | ORAL | Status: AC
  Administered 2016-10-18: 60 meq
  Filled 2016-10-18: qty 45

## 2016-10-18 MED ORDER — POTASSIUM CHLORIDE 20 MEQ/15ML (10%) PO SOLN
60.0000 meq | Freq: Once | ORAL | Status: AC
  Administered 2016-10-18: 60 meq via ORAL
  Filled 2016-10-18: qty 45

## 2016-10-18 NOTE — Progress Notes (Signed)
eLink Physician-Brief Progress Note Patient Name: Antonio [name] DOB: [birth year] MRN: [id number]   Date of Service  10/18/2016  HPI/Events of Note  Potassium =2.7; Creat = 1.77  eICU Interventions  Given 60 mEq potassium via OGT. Repeat K @ 10 am.      Intervention Category Intermediate Interventions: Electrolyte abnormality - evaluation and management  Shane Crutch 10/18/2016, 5:19 AM

## 2016-10-18 NOTE — Progress Notes (Addendum)
Nutrition Follow Up  DOCUMENTATION CODES:   Morbid obesity  INTERVENTION:    Increase Vital High Protein to goal rate of 60 ml/h (1440 ml per day)    Continue Prostat liquid protein to 60 ml TID   Total TF regimen to provide 2040 kcals, 216 gm protein, 1204 ml of free water  NUTRITION DIAGNOSIS:   Inadequate oral intake related to inability to eat as evidenced by NPO status, ongoing  GOAL:   Provide needs based on ASPEN/SCCM guidelines, met  MONITOR:   TF tolerance, Vent status, Labs, Weight trends, Skin, I & O's  ASSESSMENT:   [name] with hx of OSA/OHS and followed by cardiology.  Family reports he has not been able to get set up with PAP therapy at home.  He presented with dyspnea and altered mental status from acute on chronic hypoxic/hypercapnic respiratory failure leading to VDRF.  Patient is currently intubated on ventilator support Temp (24hrs), Avg:99.3 F (37.4 C), Min:99 F (37.2 C), Max:100.2 F (37.9 C)  Propofol >> discontinued   Pt with acute on chronic hypoxic/hypercapnic respiratory failure in setting of OSA/OHS. Vital High Protein formula currently infusing at goal rate of 50 ml/hr via OGT. CCM note reviewed >> family discussing trach/PEG. Labs and medications reviewed. CBG's [phone].  Diet Order:  Diet NPO time specified  Skin:  Wound (see comment) (diabetic toe ulcer)  Last BM:  12/17  Height:   Ht Readings from Last 1 Encounters:  10/14/2016 '5\' 11"'$  (1.803 m)    Weight:   Wt Readings from Last 1 Encounters:  10/18/16 (!) 408 lb (185.1 kg)    Ideal Body Weight:  78.1 kg  BMI:  Body mass index is 56.9 kg/m.  Estimated Nutritional Needs:   Kcal:  2072-1828  Protein:  >/= 195 gm  Fluid:  per MD  EDUCATION NEEDS:   No education needs identified at this time  Arthur Holms, RD, LDN Pager #: [phone] After-Hours Pager #: [phone]

## 2016-10-18 NOTE — Progress Notes (Signed)
[facility] ADULT ICU REPLACEMENT PROTOCOL FOR AM LAB REPLACEMENT ONLY  The patient does not apply for the [facility] Adult ICU Electrolyte Replacment Protocol based on the criteria listed below:   Is GFR >/= 40 ml/min? No.  Patient's GFR today is 38   Abnormal electrolyte(s):K2.7   If a panic level lab has been reported, has the CCM MD in charge been notified? Yes.  .   Physician:  Nicholos Johns, MD  Melrose Nakayama 10/18/2016 5:17 AM

## 2016-10-18 NOTE — Progress Notes (Signed)
PULMONARY / CRITICAL CARE MEDICINE    ADMISSION DATE:  10/07/2016 REFERRING PROVIDER: Dr. Dalene Seltzer CHIEF COMPLAINT:  Dyspnea, confusion  HISTORY OF PRESENT ILLNESS:   65 yo male with hx of OSA/OHS and followed by cardiology.  Family reports he has not been able to get set up with PAP therapy at home.  He presented with dyspnea and altered mental status from acute on chronic hypoxic/hypercapnic respiratory failure leading to VDRF.  PMHx of CAD, AS, s/p CABG with AVR 2012, diastolic CHF, DM, HTN.  SUBJECTIVE: No events overnight,  Failed weaning again this AM.  VITAL SIGNS: BP (!) 119/59   Pulse 75   Temp 100.2 F (37.9 C) (Oral)   Resp 13   Ht 5\' 11"  (1.803 m)   Wt (!) 185.1 kg (408 lb)   SpO2 90%   BMI 56.90 kg/m   INTAKE / OUTPUT: I/O last 3 completed shifts: In: 3807.8 [I.V.:1847.8; NG/GT:1760; IV Piggyback:200] Out: 6400 [Urine:6400]  PHYSICAL EXAMINATION: General:  Obese man, intubated Neuro:  Arousable and interactive, moving all ext to command HEENT: ETT, [place]/AT, PERRL, EOM-I Cardiovascular:  Distant , RRR , 1-2 +edema R>L  Lungs:  Crackles bilaterally Abdomen:  Obese but soft Musculoskeletal:  No deformities  Skin:  Stasis dermatatic changes in LE , scaling around feet   LABS:  BMET  Recent Labs Lab 10/17/16 0425 10/17/16 1000 10/18/16 0418  NA 147* 145 145  K 2.8* 3.0* 2.7*  CL 93* 94* 94*  CO2 42* 40* 42*  BUN 61* 62* 73*  CREATININE 1.63* 1.69* 1.77*  GLUCOSE 182* 188* 238*   Electrolytes  Recent Labs Lab 10/15/16 1652  10/17/16 0425 10/17/16 1000 10/18/16 0418  CALCIUM  --   < > 8.8* 8.4* 8.5*  MG 2.2  --  2.3  --  2.4  PHOS 3.4  --  5.1*  --  4.2  < > = values in this interval not displayed.  CBC  Recent Labs Lab 10/16/16 0745 10/17/16 0425 10/18/16 0418  WBC 11.0* 11.4* 12.0*  HGB 10.4* 10.1* 9.2*  HCT 35.3* 36.0* 32.2*  PLT 245 241 268   Coag's No results for input(s): APTT, INR in the last 168 hours.  Sepsis  Markers  Recent Labs Lab 10/27/2016 1300 10/09/2016 1631 10/14/16 1239 10/15/16 0526 10/16/16 0745  LATICACIDVEN 1.86 0.69  --   --   --   PROCALCITON  --   --  <0.10 0.34 0.34   ABG  Recent Labs Lab 10/15/16 0305 10/17/16 0340 10/18/16 0533  PHART 7.547* 7.400 7.387  PCO2ART 51.2* 70.2* 77.5*  PO2ART 56.0* 68.5* 58.0*   Liver Enzymes  Recent Labs Lab 09/29/2016 1247  AST 18  ALT 18  ALKPHOS 78  BILITOT 0.6  ALBUMIN 3.1*   Cardiac Enzymes No results for input(s): TROPONINI, PROBNP in the last 168 hours.  Glucose  Recent Labs Lab 10/18/16 0353 10/18/16 0410 10/18/16 0501 10/18/16 0558 10/18/16 0651 10/18/16 0735  GLUCAP 232* 112* 160* 193* 186* 149*   Imaging Dg Chest Port 1 View  Result Date: 10/18/2016 CLINICAL DATA:  Intubated patient, respiratory failure. History of aortic valve replacement in the past for a aortic stenosis, chronic CHF, diabetes, renal failure. EXAM: PORTABLE CHEST 1 VIEW COMPARISON:  Portable chest x-ray of October 17, 2016 FINDINGS: The lungs are hypoinflated. The cardiac silhouette is enlarged and indistinct. The pulmonary vascularity is engorged. The heart borders and mediastinal borders are obscured but stable. The endotracheal tube tip lies 3.4 cm above the  carina. The esophagogastric tube tip projects below the inferior margin of the image. The right subclavian venous catheter tip projects over the midportion of the SVC. IMPRESSION: Persistent hypoinflation. Cardiomegaly with pulmonary vascular congestion. Bibasilar atelectasis or pneumonia with pleural effusions. No significant change since yesterday's study. Electronically Signed   By: David  SwazilandJordan M.D.   On: 10/18/2016 07:32   STUDIES:  Head CT 12/16 >> no acute findings  CULTURES: None  ANTIBIOTICS: None  SIGNIFICANT EVENTS: 12/16 Intubated in the ED after failing BiPAP  LINES/TUBES: ETT 12/16 >>   DISCUSSION: 65 y/o man with obesity hypoventilation syndrome presenting  with decompensated respiratory failure.  ASSESSMENT / PLAN:  Acute on chronic hypoxic/hypercapnic respiratory failure in setting of OSA/OHS. - Full vent support - F/u CXR, ABG - PRN BDs - Acetazolamide for metabolic alkalosis but hold lasix - Family discussing trach/peg.  Cor pulmonale. Hx of CAD, bioprosthetic AVR, chronic diastolic CHF. - Hold further diureses for today - F/u doppler legs  Anemia of critical illness. - F/u CBC  DM. - SSI  Hx of hypothyroidism. - Synthroid  Acute metabolic encephalopathy. - RASS goal 0  Sedation: - Fentanyl drip - PRN versed  DVT prophylaxis - lovenox SUP - pepcid Nutrition - protonix Goals of care - full code  Daughters updated at length bedside.  The patient is critically ill with multiple organ systems failure and requires high complexity decision making for assessment and support, frequent evaluation and titration of therapies, application of advanced monitoring technologies and extensive interpretation of multiple databases.   Critical Care Time devoted to patient care services described in this note is  35  Minutes. This time reflects time of care of this signee Dr Koren BoundWesam Yacoub. This critical care time does not reflect procedure time, or teaching time or supervisory time of PA/NP/Med student/Med Resident etc but could involve care discussion time.  Alyson ReedyWesam G. Yacoub, M.D. [facility] Pulmonary/Critical Care Medicine. Pager: [phone]. After hours pager: [phone].

## 2016-10-18 NOTE — Procedures (Signed)
Critical ABG results hand delivered to Avery,RN.

## 2016-10-19 ENCOUNTER — Inpatient Hospital Stay (HOSPITAL_COMMUNITY): Payer: Medicare Other

## 2016-10-19 LAB — GLUCOSE, CAPILLARY
GLUCOSE-CAPILLARY: 117 mg/dL — AB (ref 65–99)
GLUCOSE-CAPILLARY: 122 mg/dL — AB (ref 65–99)
GLUCOSE-CAPILLARY: 126 mg/dL — AB (ref 65–99)
GLUCOSE-CAPILLARY: 140 mg/dL — AB (ref 65–99)
GLUCOSE-CAPILLARY: 144 mg/dL — AB (ref 65–99)
GLUCOSE-CAPILLARY: 144 mg/dL — AB (ref 65–99)
GLUCOSE-CAPILLARY: 144 mg/dL — AB (ref 65–99)
GLUCOSE-CAPILLARY: 151 mg/dL — AB (ref 65–99)
GLUCOSE-CAPILLARY: 162 mg/dL — AB (ref 65–99)
GLUCOSE-CAPILLARY: 178 mg/dL — AB (ref 65–99)
GLUCOSE-CAPILLARY: 260 mg/dL — AB (ref 65–99)
Glucose-Capillary: 105 mg/dL — ABNORMAL HIGH (ref 65–99)
Glucose-Capillary: 107 mg/dL — ABNORMAL HIGH (ref 65–99)
Glucose-Capillary: 110 mg/dL — ABNORMAL HIGH (ref 65–99)
Glucose-Capillary: 115 mg/dL — ABNORMAL HIGH (ref 65–99)
Glucose-Capillary: 118 mg/dL — ABNORMAL HIGH (ref 65–99)
Glucose-Capillary: 118 mg/dL — ABNORMAL HIGH (ref 65–99)
Glucose-Capillary: 119 mg/dL — ABNORMAL HIGH (ref 65–99)
Glucose-Capillary: 119 mg/dL — ABNORMAL HIGH (ref 65–99)
Glucose-Capillary: 126 mg/dL — ABNORMAL HIGH (ref 65–99)
Glucose-Capillary: 133 mg/dL — ABNORMAL HIGH (ref 65–99)
Glucose-Capillary: 136 mg/dL — ABNORMAL HIGH (ref 65–99)
Glucose-Capillary: 139 mg/dL — ABNORMAL HIGH (ref 65–99)
Glucose-Capillary: 168 mg/dL — ABNORMAL HIGH (ref 65–99)
Glucose-Capillary: 70 mg/dL (ref 65–99)
Glucose-Capillary: 90 mg/dL (ref 65–99)

## 2016-10-19 LAB — CBC
HCT: 32 % — ABNORMAL LOW (ref 39.0–52.0)
Hemoglobin: 8.9 g/dL — ABNORMAL LOW (ref 13.0–17.0)
MCH: 26.3 pg (ref 26.0–34.0)
MCHC: 27.8 g/dL — AB (ref 30.0–36.0)
MCV: 94.7 fL (ref 78.0–100.0)
PLATELETS: 293 10*3/uL (ref 150–400)
RBC: 3.38 MIL/uL — ABNORMAL LOW (ref 4.22–5.81)
RDW: 15.1 % (ref 11.5–15.5)
WBC: 12.6 10*3/uL — ABNORMAL HIGH (ref 4.0–10.5)

## 2016-10-19 LAB — BLOOD GAS, ARTERIAL
Acid-Base Excess: 15.7 mmol/L — ABNORMAL HIGH (ref 0.0–2.0)
Bicarbonate: 42 mmol/L — ABNORMAL HIGH (ref 20.0–28.0)
Drawn by: 398661
FIO2: 0.6
MECHVT: 600 mL
O2 SAT: 93.2 %
PATIENT TEMPERATURE: 98.6
PCO2 ART: 77.3 mmHg — AB (ref 32.0–48.0)
PEEP: 5 cmH2O
PH ART: 7.354 (ref 7.350–7.450)
PO2 ART: 73 mmHg — AB (ref 83.0–108.0)
RATE: 12 resp/min

## 2016-10-19 LAB — BASIC METABOLIC PANEL
Anion gap: 10 (ref 5–15)
BUN: 84 mg/dL — AB (ref 6–20)
CALCIUM: 8.6 mg/dL — AB (ref 8.9–10.3)
CO2: 41 mmol/L — ABNORMAL HIGH (ref 22–32)
CREATININE: 1.78 mg/dL — AB (ref 0.61–1.24)
Chloride: 99 mmol/L — ABNORMAL LOW (ref 101–111)
GFR calc Af Amer: 44 mL/min — ABNORMAL LOW (ref >60)
GFR, EST NON AFRICAN AMERICAN: 38 mL/min — AB (ref >60)
GLUCOSE: 185 mg/dL — AB (ref 65–99)
Potassium: 2.8 mmol/L — ABNORMAL LOW (ref 3.5–5.1)
Sodium: 150 mmol/L — ABNORMAL HIGH (ref 135–145)

## 2016-10-19 LAB — TRIGLYCERIDES: Triglycerides: 110 mg/dL (ref <150)

## 2016-10-19 LAB — MAGNESIUM: Magnesium: 2.5 mg/dL — ABNORMAL HIGH (ref 1.7–2.4)

## 2016-10-19 LAB — PHOSPHORUS: Phosphorus: 4.1 mg/dL (ref 2.5–4.6)

## 2016-10-19 MED ORDER — FENTANYL BOLUS VIA INFUSION
100.0000 ug | INTRAVENOUS | Status: DC | PRN
  Administered 2016-10-19 – 2016-10-23 (×8): 100 ug via INTRAVENOUS
  Filled 2016-10-19: qty 100

## 2016-10-19 MED ORDER — POTASSIUM CHLORIDE 20 MEQ/15ML (10%) PO SOLN
40.0000 meq | ORAL | Status: AC
  Administered 2016-10-19 (×2): 40 meq
  Filled 2016-10-19 (×2): qty 30

## 2016-10-19 MED ORDER — ACETAZOLAMIDE SODIUM 500 MG IJ SOLR
250.0000 mg | Freq: Four times a day (QID) | INTRAMUSCULAR | Status: AC
  Administered 2016-10-19 – 2016-10-20 (×3): 250 mg via INTRAVENOUS
  Filled 2016-10-19 (×4): qty 250

## 2016-10-19 MED ORDER — STERILE WATER FOR INJECTION IJ SOLN
INTRAMUSCULAR | Status: AC
  Administered 2016-10-19: 10 mL
  Filled 2016-10-19: qty 20

## 2016-10-19 MED ORDER — INSULIN GLARGINE 100 UNIT/ML ~~LOC~~ SOLN
25.0000 [IU] | Freq: Two times a day (BID) | SUBCUTANEOUS | Status: DC
  Administered 2016-10-19: 25 [IU] via SUBCUTANEOUS
  Filled 2016-10-19 (×2): qty 0.25

## 2016-10-19 MED ORDER — DEXTROSE 50 % IV SOLN
INTRAVENOUS | Status: AC
  Administered 2016-10-19: 12 mL
  Filled 2016-10-19: qty 50

## 2016-10-19 MED ORDER — FREE WATER
250.0000 mL | Freq: Four times a day (QID) | Status: DC
  Administered 2016-10-19 – 2016-10-20 (×4): 250 mL

## 2016-10-19 MED ORDER — FUROSEMIDE 10 MG/ML IJ SOLN
5.0000 mg/h | INTRAVENOUS | Status: DC
  Administered 2016-10-19: 5 mg/h via INTRAVENOUS
  Filled 2016-10-19: qty 25

## 2016-10-19 MED ORDER — METOLAZONE 5 MG PO TABS
5.0000 mg | ORAL_TABLET | Freq: Every day | ORAL | Status: AC
  Administered 2016-10-19: 5 mg via ORAL
  Filled 2016-10-19: qty 1

## 2016-10-19 MED ORDER — QUETIAPINE FUMARATE 25 MG PO TABS
25.0000 mg | ORAL_TABLET | Freq: Two times a day (BID) | ORAL | Status: DC
  Administered 2016-10-19 – 2016-10-21 (×5): 25 mg via ORAL
  Filled 2016-10-19 (×5): qty 1

## 2016-10-19 MED ORDER — STERILE WATER FOR INJECTION IJ SOLN
INTRAMUSCULAR | Status: AC
  Administered 2016-10-19: 20:00:00
  Filled 2016-10-19: qty 10

## 2016-10-19 NOTE — Progress Notes (Signed)
eLink Physician-Brief Progress Note Patient Name: Antonio [name] DOB: 1951-10-25 MRN: [id number]   Date of Service  10/19/2016  HPI/Events of Note  Hypokalemia  eICU Interventions  Potassium replaced     Intervention Category Intermediate Interventions: Electrolyte abnormality - evaluation and management  DETERDING,ELIZABETH 10/19/2016, 5:00 AM

## 2016-10-19 NOTE — Progress Notes (Addendum)
PULMONARY / CRITICAL CARE MEDICINE    ADMISSION DATE:  10/12/2016 REFERRING PROVIDER: Dr. Dalene SeltzerSchlossman CHIEF COMPLAINT:  Dyspnea, confusion  HISTORY OF PRESENT ILLNESS:   65 yo male with hx of OSA/OHS and followed by cardiology.  Family reports he has not been able to get set up with PAP therapy at home.  He presented with dyspnea and altered mental status from acute on chronic hypoxic/hypercapnic respiratory failure leading to VDRF.  PMHx of CAD, AS, s/p CABG with AVR 2012, diastolic CHF, DM, HTN.  SUBJECTIVE: No events overnight, failed weaning again 12/22 am.Anxiety is an issue.  VITAL SIGNS: BP 115/63   Pulse 78   Temp 99.4 F (37.4 C)   Resp 12   Ht 5\' 11"  (1.803 m)   Wt (!) 425 lb (192.8 kg)   SpO2 (!) 89%   BMI 59.28 kg/m   INTAKE / OUTPUT: I/O last 3 completed shifts: In: 4218.7 [I.V.:1902.9; NG/GT:2315.8] Out: 5415 [Urine:5415]  PHYSICAL EXAMINATION: General:  Obese man, intubated, sedated Neuro:  Arousable and interactive, moving all ext to command HEENT: ETT, [place]/AT, PERRL, EOM-I Cardiovascular:  Distant , RRR , 1-2 +edema R>L  Lungs:  Crackles bilaterally, diminished bases Abdomen:  Obese but soft, BS+ Musculoskeletal:  No deformities  Skin:  Stasis dermatatic changes in LE , scaling around feet   LABS:  BMET  Recent Labs Lab 10/17/16 1000 10/18/16 0418 10/18/16 1000 10/19/16 0350  NA 145 145  --  150*  K 3.0* 2.7* 3.0* 2.8*  CL 94* 94*  --  99*  CO2 40* 42*  --  41*  BUN 62* 73*  --  84*  CREATININE 1.69* 1.77*  --  1.78*  GLUCOSE 188* 238*  --  185*   Electrolytes  Recent Labs Lab 10/17/16 0425 10/17/16 1000 10/18/16 0418 10/19/16 0350  CALCIUM 8.8* 8.4* 8.5* 8.6*  MG 2.3  --  2.4 2.5*  PHOS 5.1*  --  4.2 4.1    CBC  Recent Labs Lab 10/17/16 0425 10/18/16 0418 10/19/16 0350  WBC 11.4* 12.0* 12.6*  HGB 10.1* 9.2* 8.9*  HCT 36.0* 32.2* 32.0*  PLT 241 268 293   Coag's No results for input(s): APTT, INR in the last 168  hours.  Sepsis Markers  Recent Labs Lab 10/23/2016 1300 10/28/2016 1631 10/14/16 1239 10/15/16 0526 10/16/16 0745  LATICACIDVEN 1.86 0.69  --   --   --   PROCALCITON  --   --  <0.10 0.34 0.34   ABG  Recent Labs Lab 10/17/16 0340 10/18/16 0533 10/19/16 0304  PHART 7.400 7.387 7.354  PCO2ART 70.2* 77.5* 77.3*  PO2ART 68.5* 58.0* 73.0*   Liver Enzymes  Recent Labs Lab 10/12/2016 1247  AST 18  ALT 18  ALKPHOS 78  BILITOT 0.6  ALBUMIN 3.1*   Cardiac Enzymes No results for input(s): TROPONINI, PROBNP in the last 168 hours.  Glucose  Recent Labs Lab 10/19/16 0308 10/19/16 0357 10/19/16 0518 10/19/16 0601 10/19/16 0707 10/19/16 0808  GLUCAP 110* 168* 117* 144* 126* 136*   Imaging Dg Chest Port 1 View  Result Date: 10/19/2016 CLINICAL DATA:  Respiratory failure with hypoxia, intubated patient, encephalopathy, chronic renal insufficiency. EXAM: PORTABLE CHEST 1 VIEW COMPARISON:  Portable chest x-ray of October 18, 2016 FINDINGS: The lungs remain hypoinflated. The cardiac silhouette remains enlarged and the pulmonary vascularity engorged. There is persistent bibasilar atelectasis or pneumonia with obscuration of the hemidiaphragms. The endotracheal tube tip lies 4.6 cm above the carina. The esophagogastric tube tip projects below  the inferior margin of the image. The right-sided PICC line tip projects over the junction of the proximal and midportions of the SVC. There has been previous median sternotomy for aortic valve replacement. IMPRESSION: Persistent hypoinflation. CHF with pulmonary interstitial edema. Bibasilar atelectasis. Electronically Signed   By: David  [place] M.D.   On: 10/19/2016 07:20   STUDIES:  Head CT 12/16 >> no acute findings Lower extremity venous dopplers 12/18>> No obvious evidence of deep vein or superficial thrombosis involving the visualized veins of the right lower   extremity and left lower  extremity.  CULTURES: None  ANTIBIOTICS: None  SIGNIFICANT EVENTS: 12/16 Intubated in the ED after failing BiPAP 12/22 Continued SBT falures  LINES/TUBES: ETT 12/16 >>   DISCUSSION: 65 y/o man with obesity hypoventilation syndrome presenting with decompensated respiratory failure.  ASSESSMENT / PLAN:  Acute on chronic hypoxic/hypercapnic respiratory failure in setting of OSA/OHS. Failed SBT 12/22 - Full vent support - F/u CXR, ABG prn ( CHF with pulmonary interstitial edema) - PRN BDs - Acetazolamide for metabolic alkalosis but hold lasix - Family discussing trach/peg.  Cor pulmonale. Hx of CAD, bioprosthetic AVR, chronic diastolic CHF. - Hold further diureses for today - Continued negative I&O status, but weight up overnight - F/u doppler legs ( Negative for DVT 12/18)  Anemia of critical illness. No obvious signs of bleeding - F/u CBC  Leukocytosis - Follow fever curve and WBC - CXR prn - Culture for temp of >101.5  DM. - SSI - Insulin gtt  Hx of hypothyroidism. - Synthroid  Acute metabolic encephalopathy. - RASS goal 0  Sedation: - Fentanyl drip - PRN versed - Add seroquel to minimize versed need.  DVT prophylaxis - lovenox SUP - pepcid Nutrition - protonix/ Tube feeds Goals of care - full code  Daughters, ex wife at bedside, updated.  Bevelyn Ngo, AGACNP-BC [facility] Pulmonary/Critical Care Medicine Pager # [phone] 10/19/2016  Attending Note:  65 year old male who is morbidly obese with pulmonary HTN and respiratory failure with anasarca.  Remains hyperglycemic on an insulin drip.  On exam, asleep but arousable on fentanyl 300 mcg/hr and PRN versed.  I reviewed CXR myself, ETT ok and pulmonary edema noted.  Cr unchanged on labs.  Will restart lasix drip at 5 mg/hr with zaroxolyn and acetazolamide.  Free water added.  Strict I/O.  If Cr worsens then will likely need renal involvement.  Family updated bedside.  After discussion, decision  was made to make patient a LCB with no CPR/cardioversion/dialysis/trach/peg.  Will continue support over the next few days then will need to discuss plan of care.  The patient is critically ill with multiple organ systems failure and requires high complexity decision making for assessment and support, frequent evaluation and titration of therapies, application of advanced monitoring technologies and extensive interpretation of multiple databases.   Critical Care Time devoted to patient care services described in this note is  35  Minutes. This time reflects time of care of this signee Dr Koren Bound. This critical care time does not reflect procedure time, or teaching time or supervisory time of PA/NP/Med student/Med Resident etc but could involve care discussion time.  Alyson Reedy, M.D. [facility] Pulmonary/Critical Care Medicine. Pager: [phone]. After hours pager: [phone].

## 2016-10-20 ENCOUNTER — Inpatient Hospital Stay (HOSPITAL_COMMUNITY): Payer: Medicare Other

## 2016-10-20 DIAGNOSIS — E8770 Fluid overload, unspecified: Secondary | ICD-10-CM

## 2016-10-20 LAB — GLUCOSE, CAPILLARY
GLUCOSE-CAPILLARY: 102 mg/dL — AB (ref 65–99)
GLUCOSE-CAPILLARY: 137 mg/dL — AB (ref 65–99)
GLUCOSE-CAPILLARY: 119 mg/dL — AB (ref 65–99)
GLUCOSE-CAPILLARY: 107 mg/dL — AB (ref 65–99)
GLUCOSE-CAPILLARY: 135 mg/dL — AB (ref 65–99)
GLUCOSE-CAPILLARY: 182 mg/dL — AB (ref 65–99)
GLUCOSE-CAPILLARY: 176 mg/dL — AB (ref 65–99)
GLUCOSE-CAPILLARY: 207 mg/dL — AB (ref 65–99)
GLUCOSE-CAPILLARY: 190 mg/dL — AB (ref 65–99)
GLUCOSE-CAPILLARY: 167 mg/dL — AB (ref 65–99)
Glucose-Capillary: 104 mg/dL — ABNORMAL HIGH (ref 65–99)
Glucose-Capillary: 112 mg/dL — ABNORMAL HIGH (ref 65–99)
Glucose-Capillary: 121 mg/dL — ABNORMAL HIGH (ref 65–99)
Glucose-Capillary: 124 mg/dL — ABNORMAL HIGH (ref 65–99)
Glucose-Capillary: 124 mg/dL — ABNORMAL HIGH (ref 65–99)
Glucose-Capillary: 127 mg/dL — ABNORMAL HIGH (ref 65–99)
Glucose-Capillary: 144 mg/dL — ABNORMAL HIGH (ref 65–99)
Glucose-Capillary: 161 mg/dL — ABNORMAL HIGH (ref 65–99)
Glucose-Capillary: 161 mg/dL — ABNORMAL HIGH (ref 65–99)
Glucose-Capillary: 172 mg/dL — ABNORMAL HIGH (ref 65–99)
Glucose-Capillary: 191 mg/dL — ABNORMAL HIGH (ref 65–99)
Glucose-Capillary: 203 mg/dL — ABNORMAL HIGH (ref 65–99)

## 2016-10-20 LAB — BLOOD GAS, ARTERIAL
ACID-BASE EXCESS: 15.5 mmol/L — AB (ref 0.0–2.0)
Bicarbonate: 41.8 mmol/L — ABNORMAL HIGH (ref 20.0–28.0)
DRAWN BY: 36496
FIO2: 50
MECHVT: 600 mL
O2 SAT: 89.7 %
PEEP/CPAP: 5 cmH2O
PH ART: 7.353 (ref 7.350–7.450)
Patient temperature: 98.6
RATE: 12 resp/min
pCO2 arterial: 77.1 mmHg (ref 32.0–48.0)
pO2, Arterial: 61.9 mmHg — ABNORMAL LOW (ref 83.0–108.0)

## 2016-10-20 LAB — BASIC METABOLIC PANEL
ANION GAP: 8 (ref 5–15)
BUN: 94 mg/dL — AB (ref 6–20)
CALCIUM: 8.8 mg/dL — AB (ref 8.9–10.3)
CO2: 42 mmol/L — AB (ref 22–32)
Chloride: 103 mmol/L (ref 101–111)
Creatinine, Ser: 1.84 mg/dL — ABNORMAL HIGH (ref 0.61–1.24)
GFR calc Af Amer: 42 mL/min — ABNORMAL LOW (ref >60)
GFR, EST NON AFRICAN AMERICAN: 37 mL/min — AB (ref >60)
GLUCOSE: 128 mg/dL — AB (ref 65–99)
Potassium: 2.6 mmol/L — CL (ref 3.5–5.1)
Sodium: 153 mmol/L — ABNORMAL HIGH (ref 135–145)

## 2016-10-20 LAB — CBC
HCT: 33.7 % — ABNORMAL LOW (ref 39.0–52.0)
HEMOGLOBIN: 9.3 g/dL — AB (ref 13.0–17.0)
MCH: 26.1 pg (ref 26.0–34.0)
MCHC: 27.6 g/dL — AB (ref 30.0–36.0)
MCV: 94.7 fL (ref 78.0–100.0)
Platelets: 306 10*3/uL (ref 150–400)
RBC: 3.56 MIL/uL — ABNORMAL LOW (ref 4.22–5.81)
RDW: 15 % (ref 11.5–15.5)
WBC: 11.1 10*3/uL — ABNORMAL HIGH (ref 4.0–10.5)

## 2016-10-20 LAB — MAGNESIUM: MAGNESIUM: 2.8 mg/dL — AB (ref 1.7–2.4)

## 2016-10-20 LAB — PHOSPHORUS: Phosphorus: 3.9 mg/dL (ref 2.5–4.6)

## 2016-10-20 MED ORDER — STERILE WATER FOR INJECTION IJ SOLN
INTRAMUSCULAR | Status: AC
  Administered 2016-10-20: 10 mL
  Filled 2016-10-20: qty 10

## 2016-10-20 MED ORDER — CHLORHEXIDINE GLUCONATE 0.12 % MT SOLN
OROMUCOSAL | Status: AC
  Filled 2016-10-20: qty 15

## 2016-10-20 MED ORDER — METOLAZONE 5 MG PO TABS
5.0000 mg | ORAL_TABLET | Freq: Every day | ORAL | Status: AC
  Administered 2016-10-20: 5 mg via ORAL
  Filled 2016-10-20: qty 1

## 2016-10-20 MED ORDER — DEXTROSE 5 % IV SOLN
10.0000 mg/h | INTRAVENOUS | Status: AC
  Administered 2016-10-20: 10 mg/h via INTRAVENOUS
  Filled 2016-10-20 (×2): qty 25

## 2016-10-20 MED ORDER — POTASSIUM CHLORIDE 20 MEQ/15ML (10%) PO SOLN
40.0000 meq | ORAL | Status: DC
  Administered 2016-10-20 (×2): 40 meq
  Filled 2016-10-20 (×2): qty 30

## 2016-10-20 MED ORDER — ALBUMIN HUMAN 25 % IV SOLN
25.0000 g | Freq: Four times a day (QID) | INTRAVENOUS | Status: AC
  Administered 2016-10-20 – 2016-10-21 (×4): 25 g via INTRAVENOUS
  Filled 2016-10-20: qty 50
  Filled 2016-10-20 (×3): qty 100

## 2016-10-20 MED ORDER — CHLORHEXIDINE GLUCONATE 0.12 % MT SOLN
OROMUCOSAL | Status: AC
  Administered 2016-10-20: 15 mL
  Filled 2016-10-20: qty 15

## 2016-10-20 MED ORDER — FREE WATER
400.0000 mL | Freq: Four times a day (QID) | Status: DC
  Administered 2016-10-20 – 2016-10-22 (×7): 400 mL

## 2016-10-20 MED ORDER — ACETAZOLAMIDE SODIUM 500 MG IJ SOLR
250.0000 mg | Freq: Four times a day (QID) | INTRAMUSCULAR | Status: AC
Start: 2016-10-20 — End: 2016-10-20
  Administered 2016-10-20 (×3): 250 mg via INTRAVENOUS
  Filled 2016-10-20 (×3): qty 250

## 2016-10-20 MED ORDER — INSULIN GLARGINE 100 UNIT/ML ~~LOC~~ SOLN
30.0000 [IU] | Freq: Two times a day (BID) | SUBCUTANEOUS | Status: DC
Start: 2016-10-20 — End: 2016-10-24
  Administered 2016-10-20 – 2016-10-24 (×9): 30 [IU] via SUBCUTANEOUS
  Filled 2016-10-20 (×10): qty 0.3

## 2016-10-20 MED ORDER — POTASSIUM CHLORIDE 20 MEQ/15ML (10%) PO SOLN
40.0000 meq | Freq: Four times a day (QID) | ORAL | Status: AC
  Administered 2016-10-20 (×3): 40 meq
  Filled 2016-10-20 (×3): qty 30

## 2016-10-20 NOTE — Progress Notes (Signed)
CRITICAL VALUE ALERT  Critical value received:  PCO2 77, K 2.6  Date of notification:  10/20/2016  Time of notification:  0420  Critical value read back:Yes.    Nurse who received alert:  Harvin Hazel  MD notified (1st page):  Dr. Darrick Penna  Time of first page:  E-link called @ [phone]    MD notified (2nd page):  Time of second page:  Responding MD:  Dr. Darrick Penna  Time MD responded:  [phone]

## 2016-10-20 NOTE — Progress Notes (Signed)
PULMONARY / CRITICAL CARE MEDICINE    ADMISSION DATE:  10/12/2016 REFERRING PROVIDER: Dr. Dalene Seltzer CHIEF COMPLAINT:  Dyspnea, confusion  HISTORY OF PRESENT ILLNESS:   65 yo male with hx of OSA/OHS and followed by cardiology.  Family reports he has not been able to get set up with PAP therapy at home.  He presented with dyspnea and altered mental status from acute on chronic hypoxic/hypercapnic respiratory failure leading to VDRF.  PMHx of CAD, AS, s/p CABG with AVR 2012, diastolic CHF, DM, HTN.  SUBJECTIVE: No events overnight, failed weaning again 12/23 am.  Sedate this AM  VITAL SIGNS: BP 120/67   Pulse 77   Temp 100 F (37.8 C) (Oral)   Resp 19   Ht 5\' 11"  (1.803 m)   Wt (!) 173.7 kg (383 lb)   SpO2 (!) 89%   BMI 53.42 kg/m   INTAKE / OUTPUT: I/O last 3 completed shifts: In: 4547.9 [I.V.:1787.9; NG/GT:2760] Out: 4950 [Urine:4950]  PHYSICAL EXAMINATION: General:  Obese man, intubated, sedated Neuro:  Arousable but less than yesterday, moving all ext to command HEENT: ETT, [place]/AT, PERRL, EOM-I Cardiovascular:  Distant , RRR , 1-2 +edema R>L  Lungs:  Crackles bilaterally, diminished bases Abdomen:  Obese but soft, BS+ Musculoskeletal:  No deformities  Skin:  Stasis dermatatic changes in LE , scaling around feet   LABS:  BMET  Recent Labs Lab 10/18/16 0418 10/18/16 1000 10/19/16 0350 10/20/16 0345  NA 145  --  150* 153*  K 2.7* 3.0* 2.8* 2.6*  CL 94*  --  99* 103  CO2 42*  --  41* 42*  BUN 73*  --  84* 94*  CREATININE 1.77*  --  1.78* 1.84*  GLUCOSE 238*  --  185* 128*   Electrolytes  Recent Labs Lab 10/18/16 0418 10/19/16 0350 10/20/16 0345  CALCIUM 8.5* 8.6* 8.8*  MG 2.4 2.5* 2.8*  PHOS 4.2 4.1 3.9    CBC  Recent Labs Lab 10/18/16 0418 10/19/16 0350 10/20/16 0345  WBC 12.0* 12.6* 11.1*  HGB 9.2* 8.9* 9.3*  HCT 32.2* 32.0* 33.7*  PLT 268 293 306   Coag's No results for input(s): APTT, INR in the last 168 hours.  Sepsis  Markers  Recent Labs Lab 10/16/2016 1300 10/21/2016 1631 10/14/16 1239 10/15/16 0526 10/16/16 0745  LATICACIDVEN 1.86 0.69  --   --   --   PROCALCITON  --   --  <0.10 0.34 0.34   ABG  Recent Labs Lab 10/18/16 0533 10/19/16 0304 10/20/16 0400  PHART 7.387 7.354 7.353  PCO2ART 77.5* 77.3* 77.1*  PO2ART 58.0* 73.0* 61.9*   Liver Enzymes  Recent Labs Lab 10/15/2016 1247  AST 18  ALT 18  ALKPHOS 78  BILITOT 0.6  ALBUMIN 3.1*   Cardiac Enzymes No results for input(s): TROPONINI, PROBNP in the last 168 hours.  Glucose  Recent Labs Lab 10/20/16 0104 10/20/16 0200 10/20/16 0305 10/20/16 0345 10/20/16 0517 10/20/16 0619  GLUCAP 102* 137* 104* 119* 107* 127*   Imaging No results found. STUDIES:  Head CT 12/16 >> no acute findings Lower extremity venous dopplers 12/18>> No obvious evidence of deep vein or superficial thrombosis involving the visualized veins of the right lower   extremity and left lower extremity.  CULTURES: None  ANTIBIOTICS: None  SIGNIFICANT EVENTS: 12/16 Intubated in the ED after failing BiPAP 12/22 Continued SBT falures  LINES/TUBES: ETT 12/16 >>   DISCUSSION: 65 y/o man with obesity hypoventilation syndrome presenting with decompensated respiratory failure.  ASSESSMENT /  PLAN:  Acute on chronic hypoxic/hypercapnic respiratory failure in setting of OSA/OHS. Failed SBT 12/22 - Full vent support - F/u CXR, ABG prn ( CHF with pulmonary interstitial edema) - PRN BDs - Continue aggressive diureses as below - Family discussion, no trach/peg/dialysis.  Cor pulmonale. Hx of CAD, bioprosthetic AVR, chronic diastolic CHF. - Hold further diureses for today - Continued negative I&O status, but weight up overnight - F/u doppler legs ( Negative for DVT 12/18)  Anemia of critical illness. No obvious signs of bleeding - F/u CBC  Leukocytosis - Follow fever curve and WBC - CXR prn - Culture for temp of >101.5  DM. - SSI - Insulin  gtt  Hx of hypothyroidism. - Synthroid  Acute metabolic encephalopathy. - RASS goal 0  Renal failure: - Lasix 10 mg/hr drip x24 hours - Zaroxolyn 5 mg PO x1 - Acetazolamide 250 mg IV q6 x3 - KCl 40 meq PO QID x3 doses - Albumin 25 x4 doses  Sedation: - Fentanyl drip - PRN versed - Add seroquel to minimize versed need.  DVT prophylaxis - lovenox SUP - pepcid Nutrition - protonix/ Tube feeds Goals of care - full code  The patient is critically ill with multiple organ systems failure and requires high complexity decision making for assessment and support, frequent evaluation and titration of therapies, application of advanced monitoring technologies and extensive interpretation of multiple databases.   Critical Care Time devoted to patient care services described in this note is  35  Minutes. This time reflects time of care of this signee Dr Koren BoundWesam Yacoub. This critical care time does not reflect procedure time, or teaching time or supervisory time of PA/NP/Med student/Med Resident etc but could involve care discussion time.  Alyson ReedyWesam G. Yacoub, M.D. [facility] Pulmonary/Critical Care Medicine. Pager: [phone]. After hours pager: [phone].

## 2016-10-20 NOTE — Progress Notes (Signed)
eLink Physician-Brief Progress Note Patient Name: Antonio [name] DOB: [birth year] MRN: [id number]   Date of Service  10/20/2016  HPI/Events of Note  Hypokalemia  eICU Interventions  Potassium replaced     Intervention Category Intermediate Interventions: Electrolyte abnormality - evaluation and management  DETERDING,ELIZABETH 10/20/2016, 4:22 AM

## 2016-10-21 ENCOUNTER — Inpatient Hospital Stay (HOSPITAL_COMMUNITY): Payer: Medicare Other

## 2016-10-21 LAB — BASIC METABOLIC PANEL
Anion gap: 11 (ref 5–15)
Anion gap: 8 (ref 5–15)
BUN: 99 mg/dL — ABNORMAL HIGH (ref 6–20)
BUN: 99 mg/dL — AB (ref 6–20)
CALCIUM: 9 mg/dL (ref 8.9–10.3)
CALCIUM: 9 mg/dL (ref 8.9–10.3)
CO2: 40 mmol/L — ABNORMAL HIGH (ref 22–32)
CO2: 43 mmol/L — ABNORMAL HIGH (ref 22–32)
CREATININE: 1.68 mg/dL — AB (ref 0.61–1.24)
Chloride: 102 mmol/L (ref 101–111)
Chloride: 103 mmol/L (ref 101–111)
Creatinine, Ser: 1.68 mg/dL — ABNORMAL HIGH (ref 0.61–1.24)
GFR calc Af Amer: 47 mL/min — ABNORMAL LOW (ref >60)
GFR calc Af Amer: 47 mL/min — ABNORMAL LOW (ref >60)
GFR, EST NON AFRICAN AMERICAN: 41 mL/min — AB (ref >60)
GFR, EST NON AFRICAN AMERICAN: 41 mL/min — AB (ref >60)
Glucose, Bld: 171 mg/dL — ABNORMAL HIGH (ref 65–99)
Glucose, Bld: 148 mg/dL — ABNORMAL HIGH (ref 65–99)
POTASSIUM: 2.6 mmol/L — AB (ref 3.5–5.1)
POTASSIUM: 3.5 mmol/L (ref 3.5–5.1)
SODIUM: 154 mmol/L — AB (ref 135–145)
Sodium: 153 mmol/L — ABNORMAL HIGH (ref 135–145)

## 2016-10-21 LAB — BLOOD GAS, ARTERIAL
ACID-BASE EXCESS: 16.2 mmol/L — AB (ref 0.0–2.0)
BICARBONATE: 42 mmol/L — AB (ref 20.0–28.0)
DRAWN BY: 418751
FIO2: 50
O2 SAT: 95.8 %
PEEP/CPAP: 5 cmH2O
Patient temperature: 98.6
RATE: 12 resp/min
VT: 600 mL
pCO2 arterial: 68.9 mmHg (ref 32.0–48.0)
pH, Arterial: 7.402 (ref 7.350–7.450)
pO2, Arterial: 81.6 mmHg — ABNORMAL LOW (ref 83.0–108.0)

## 2016-10-21 LAB — GLUCOSE, CAPILLARY
GLUCOSE-CAPILLARY: 184 mg/dL — AB (ref 65–99)
GLUCOSE-CAPILLARY: 166 mg/dL — AB (ref 65–99)
GLUCOSE-CAPILLARY: 129 mg/dL — AB (ref 65–99)
GLUCOSE-CAPILLARY: 178 mg/dL — AB (ref 65–99)
GLUCOSE-CAPILLARY: 159 mg/dL — AB (ref 65–99)
GLUCOSE-CAPILLARY: 162 mg/dL — AB (ref 65–99)
GLUCOSE-CAPILLARY: 144 mg/dL — AB (ref 65–99)
GLUCOSE-CAPILLARY: 160 mg/dL — AB (ref 65–99)
GLUCOSE-CAPILLARY: 159 mg/dL — AB (ref 65–99)
GLUCOSE-CAPILLARY: 177 mg/dL — AB (ref 65–99)
Glucose-Capillary: 131 mg/dL — ABNORMAL HIGH (ref 65–99)
Glucose-Capillary: 141 mg/dL — ABNORMAL HIGH (ref 65–99)
Glucose-Capillary: 147 mg/dL — ABNORMAL HIGH (ref 65–99)
Glucose-Capillary: 153 mg/dL — ABNORMAL HIGH (ref 65–99)
Glucose-Capillary: 157 mg/dL — ABNORMAL HIGH (ref 65–99)
Glucose-Capillary: 160 mg/dL — ABNORMAL HIGH (ref 65–99)
Glucose-Capillary: 164 mg/dL — ABNORMAL HIGH (ref 65–99)
Glucose-Capillary: 166 mg/dL — ABNORMAL HIGH (ref 65–99)
Glucose-Capillary: 170 mg/dL — ABNORMAL HIGH (ref 65–99)
Glucose-Capillary: 240 mg/dL — ABNORMAL HIGH (ref 65–99)

## 2016-10-21 LAB — CBC
HEMATOCRIT: 33.3 % — AB (ref 39.0–52.0)
HEMOGLOBIN: 9.2 g/dL — AB (ref 13.0–17.0)
MCH: 26.1 pg (ref 26.0–34.0)
MCHC: 27.6 g/dL — AB (ref 30.0–36.0)
MCV: 94.3 fL (ref 78.0–100.0)
Platelets: 331 10*3/uL (ref 150–400)
RBC: 3.53 MIL/uL — AB (ref 4.22–5.81)
RDW: 15.1 % (ref 11.5–15.5)
WBC: 9.8 10*3/uL (ref 4.0–10.5)

## 2016-10-21 LAB — PHOSPHORUS: PHOSPHORUS: 3.9 mg/dL (ref 2.5–4.6)

## 2016-10-21 LAB — MAGNESIUM: MAGNESIUM: 2.7 mg/dL — AB (ref 1.7–2.4)

## 2016-10-21 MED ORDER — ALBUMIN HUMAN 25 % IV SOLN
25.0000 g | Freq: Four times a day (QID) | INTRAVENOUS | Status: AC
  Administered 2016-10-21 – 2016-10-22 (×4): 25 g via INTRAVENOUS
  Filled 2016-10-21: qty 50
  Filled 2016-10-21: qty 100
  Filled 2016-10-21 (×3): qty 50

## 2016-10-21 MED ORDER — ENOXAPARIN SODIUM 80 MG/0.8ML ~~LOC~~ SOLN
80.0000 mg | SUBCUTANEOUS | Status: DC
  Administered 2016-10-22 – 2016-10-23 (×2): 80 mg via SUBCUTANEOUS
  Filled 2016-10-21 (×3): qty 0.8

## 2016-10-21 MED ORDER — POTASSIUM CHLORIDE 20 MEQ/15ML (10%) PO SOLN
40.0000 meq | Freq: Once | ORAL | Status: AC
  Administered 2016-10-21: 40 meq via ORAL
  Filled 2016-10-21: qty 30

## 2016-10-21 MED ORDER — ACETAZOLAMIDE SODIUM 500 MG IJ SOLR
250.0000 mg | Freq: Four times a day (QID) | INTRAMUSCULAR | Status: AC
Start: 2016-10-21 — End: 2016-10-22
  Administered 2016-10-21 – 2016-10-22 (×3): 250 mg via INTRAVENOUS
  Filled 2016-10-21 (×3): qty 250

## 2016-10-21 MED ORDER — POTASSIUM CHLORIDE 20 MEQ/15ML (10%) PO SOLN
40.0000 meq | ORAL | Status: AC
  Administered 2016-10-21 (×3): 40 meq
  Filled 2016-10-21 (×4): qty 30

## 2016-10-21 MED ORDER — POTASSIUM CHLORIDE 20 MEQ/15ML (10%) PO SOLN: 40.0000 meq | Freq: Every day | ORAL | Status: DC

## 2016-10-21 MED ORDER — FUROSEMIDE 10 MG/ML IJ SOLN
10.0000 mg/h | INTRAVENOUS | Status: AC
  Administered 2016-10-21: 10 mg/h via INTRAVENOUS
  Filled 2016-10-21 (×2): qty 25

## 2016-10-21 MED ORDER — POTASSIUM CHLORIDE 20 MEQ/15ML (10%) PO SOLN: 40.0000 meq | Freq: Three times a day (TID) | ORAL | Status: DC

## 2016-10-21 MED ORDER — METOLAZONE 5 MG PO TABS
5.0000 mg | ORAL_TABLET | Freq: Every day | ORAL | Status: AC
  Administered 2016-10-21: 5 mg via ORAL
  Filled 2016-10-21: qty 1

## 2016-10-21 MED ORDER — QUETIAPINE FUMARATE 25 MG PO TABS
50.0000 mg | ORAL_TABLET | Freq: Two times a day (BID) | ORAL | Status: DC
  Administered 2016-10-21 – 2016-10-24 (×6): 50 mg via ORAL
  Filled 2016-10-21 (×6): qty 2

## 2016-10-21 MED ORDER — SODIUM CHLORIDE 0.9 % IV SOLN
Freq: Once | INTRAVENOUS | Status: AC
  Administered 2016-10-21: 11:00:00 via INTRAVENOUS
  Filled 2016-10-21: qty 500

## 2016-10-21 NOTE — Progress Notes (Signed)
PULMONARY / CRITICAL CARE MEDICINE    ADMISSION DATE:  10-17-16 REFERRING PROVIDER: Dr. Dalene Seltzer CHIEF COMPLAINT:  Dyspnea, confusion  HISTORY OF PRESENT ILLNESS:   65 yo male with hx of OSA/OHS and followed by cardiology.  Family reports he has not been able to get set up with PAP therapy at home.  He presented with dyspnea and altered mental status from acute on chronic hypoxic/hypercapnic respiratory failure leading to VDRF.  PMHx of CAD, AS, s/p CABG with AVR 2012, diastolic CHF, DM, HTN.  SUBJECTIVE: No events overnight, failed weaning again 12/23 am.  Sedate this AM  VITAL SIGNS: BP (!) 131/52   Pulse 77   Temp 99 F (37.2 C) (Axillary)   Resp 15   Ht 5\' 11"  (1.803 m)   Wt (!) 167.8 kg (370 lb)   SpO2 (!) 89%   BMI 51.60 kg/m   INTAKE / OUTPUT: I/O last 3 completed shifts: In: 5153.5 [I.V.:1623.5; NG/GT:3230; IV Piggyback:300] Out: 7525 [Urine:7525]  PHYSICAL EXAMINATION: General:  Obese man, intubated, sedated Neuro:  Arousable but less than yesterday, moving all ext to command HEENT: ETT, [place]/AT, PERRL, EOM-I Cardiovascular:  Distant , RRR , 1-2 +edema R>L  Lungs:  Crackles bilaterally, diminished bases Abdomen:  Obese but soft, BS+ Musculoskeletal:  No deformities  Skin:  Stasis dermatatic changes in LE , scaling around feet   LABS:  BMET  Recent Labs Lab 10/19/16 0350 10/20/16 0345 10/21/16 0400  NA 150* 153* 153*  K 2.8* 2.6* 2.6*  CL 99* 103 102  CO2 41* 42* 40*  BUN 84* 94* 99*  CREATININE 1.78* 1.84* 1.68*  GLUCOSE 185* 128* 171*   Electrolytes  Recent Labs Lab 10/19/16 0350 10/20/16 0345 10/21/16 0400  CALCIUM 8.6* 8.8* 9.0  MG 2.5* 2.8* 2.7*  PHOS 4.1 3.9 3.9    CBC  Recent Labs Lab 10/19/16 0350 10/20/16 0345 10/21/16 0400  WBC 12.6* 11.1* 9.8  HGB 8.9* 9.3* 9.2*  HCT 32.0* 33.7* 33.3*  PLT 293 306 331   Coag's No results for input(s): APTT, INR in the last 168 hours.  Sepsis Markers  Recent Labs Lab  10/14/16 1239 10/15/16 0526 10/16/16 0745  PROCALCITON <0.10 0.34 0.34   ABG  Recent Labs Lab 10/19/16 0304 10/20/16 0400 10/21/16 0410  PHART 7.354 7.353 7.402  PCO2ART 77.3* 77.1* 68.9*  PO2ART 73.0* 61.9* 81.6*   Liver Enzymes No results for input(s): AST, ALT, ALKPHOS, BILITOT, ALBUMIN in the last 168 hours. Cardiac Enzymes No results for input(s): TROPONINI, PROBNP in the last 168 hours.  Glucose  Recent Labs Lab 10/21/16 0110 10/21/16 0214 10/21/16 0311 10/21/16 0406 10/21/16 0510 10/21/16 0807  GLUCAP 131* 164* 184* 166* 157* 129*   Imaging Dg Chest Port 1 View  Result Date: 10/21/2016 CLINICAL DATA:  ETT EXAM: PORTABLE CHEST 1 VIEW COMPARISON:  10/20/2016 FINDINGS: Endotracheal tube terminates 6 cm above the carina. Cardiomegaly with mild interstitial edema. Suspected small right pleural effusion. No pneumothorax. Median sternotomy. IMPRESSION: Endotracheal tube terminates 6 cm above the carina. Cardiomegaly with mild interstitial edema and suspected small right pleural effusion. Electronically Signed   By: Charline Bills M.D.   On: 10/21/2016 09:27   STUDIES:  Head CT 12/16 >> no acute findings Lower extremity venous dopplers 12/18>> No obvious evidence of deep vein or superficial thrombosis involving the visualized veins of the right lower   extremity and left lower extremity.  CULTURES: None  ANTIBIOTICS: None  SIGNIFICANT EVENTS: 12/16 Intubated in the ED after failing  BiPAP 12/22 Continued SBT falures  LINES/TUBES: ETT 12/16 >>   DISCUSSION: [age] y/o man with obesity hypoventilation syndrome presenting with decompensated respiratory failure.  ASSESSMENT / PLAN:  Acute on chronic hypoxic/hypercapnic respiratory failure in setting of OSA/OHS. Failed SBT 12/22 - Full vent support - F/u CXR, ABG prn ( CHF with pulmonary interstitial edema) - PRN BDs - Continue aggressive diureses as below - Family discussion, no trach/peg/dialysis.  Cor  pulmonale. Hx of CAD, bioprosthetic AVR, chronic diastolic CHF. - Hold further diureses for today - Continued negative I&O status, but weight up overnight - F/u doppler legs ( Negative for DVT 12/18)  Anemia of critical illness. No obvious signs of bleeding - F/u CBC  Leukocytosis - Follow fever curve and WBC - CXR prn - Culture for temp of >101.5  DM. - SSI - Insulin gtt  Hx of hypothyroidism. - Synthroid  Acute metabolic encephalopathy. - RASS goal 0  Renal failure: - Lasix 10 mg/hr drip x24 hours - Zaroxolyn 5 mg PO x1 - Acetazolamide 250 mg IV q6 x3 - KCl 40 meq PO BID x2 doses - Albumin 25 x4 doses - KCl 40 meq IV  Sedation: - Fentanyl drip - PRN versed - Seroquel to minimize versed need, increase to 50  DVT prophylaxis - lovenox SUP - pepcid Nutrition - protonix/ Tube feeds Goals of care - LCB with no CPR/cardioversion/dialysis/trach/peg.  Daughter updated at length bedside  The patient is critically ill with multiple organ systems failure and requires high complexity decision making for assessment and support, frequent evaluation and titration of therapies, application of advanced monitoring technologies and extensive interpretation of multiple databases.   Critical Care Time devoted to patient care services described in this note is  35  Minutes. This time reflects time of care of this signee Dr Koren BoundWesam Yacoub. This critical care time does not reflect procedure time, or teaching time or supervisory time of PA/NP/Med student/Med Resident etc but could involve care discussion time.  Alyson ReedyWesam G. Yacoub, M.D. [facility] Pulmonary/Critical Care Medicine. Pager: [phone]. After hours pager: [phone].

## 2016-10-21 NOTE — Progress Notes (Signed)
eLink Physician-Brief Progress Note Patient Name: Antonio [name] DOB: Feb 26, 1951 MRN: [id number]   Date of Service  10/21/2016  HPI/Events of Note  Hypokalemia  eICU Interventions  Potassium replaced     Intervention Category Major Interventions: Electrolyte abnormality - evaluation and management  DETERDING,ELIZABETH 10/21/2016, 4:42 AM

## 2016-10-22 ENCOUNTER — Inpatient Hospital Stay (HOSPITAL_COMMUNITY): Payer: Medicare Other

## 2016-10-22 DIAGNOSIS — I10 Essential (primary) hypertension: Secondary | ICD-10-CM

## 2016-10-22 LAB — GLUCOSE, CAPILLARY
GLUCOSE-CAPILLARY: 150 mg/dL — AB (ref 65–99)
GLUCOSE-CAPILLARY: 136 mg/dL — AB (ref 65–99)
GLUCOSE-CAPILLARY: 154 mg/dL — AB (ref 65–99)
GLUCOSE-CAPILLARY: 148 mg/dL — AB (ref 65–99)
GLUCOSE-CAPILLARY: 173 mg/dL — AB (ref 65–99)
GLUCOSE-CAPILLARY: 165 mg/dL — AB (ref 65–99)
GLUCOSE-CAPILLARY: 141 mg/dL — AB (ref 65–99)
GLUCOSE-CAPILLARY: 177 mg/dL — AB (ref 65–99)
GLUCOSE-CAPILLARY: 142 mg/dL — AB (ref 65–99)
GLUCOSE-CAPILLARY: 103 mg/dL — AB (ref 65–99)
Glucose-Capillary: 147 mg/dL — ABNORMAL HIGH (ref 65–99)
Glucose-Capillary: 147 mg/dL — ABNORMAL HIGH (ref 65–99)
Glucose-Capillary: 143 mg/dL — ABNORMAL HIGH (ref 65–99)
Glucose-Capillary: 164 mg/dL — ABNORMAL HIGH (ref 65–99)
Glucose-Capillary: 190 mg/dL — ABNORMAL HIGH (ref 65–99)
Glucose-Capillary: 198 mg/dL — ABNORMAL HIGH (ref 65–99)
Glucose-Capillary: 153 mg/dL — ABNORMAL HIGH (ref 65–99)
Glucose-Capillary: 152 mg/dL — ABNORMAL HIGH (ref 65–99)
Glucose-Capillary: 147 mg/dL — ABNORMAL HIGH (ref 65–99)
Glucose-Capillary: 163 mg/dL — ABNORMAL HIGH (ref 65–99)
Glucose-Capillary: 142 mg/dL — ABNORMAL HIGH (ref 65–99)
Glucose-Capillary: 189 mg/dL — ABNORMAL HIGH (ref 65–99)

## 2016-10-22 LAB — CBC
HCT: 33.4 % — ABNORMAL LOW (ref 39.0–52.0)
Hemoglobin: 9.4 g/dL — ABNORMAL LOW (ref 13.0–17.0)
MCH: 26.6 pg (ref 26.0–34.0)
MCHC: 28.1 g/dL — AB (ref 30.0–36.0)
MCV: 94.4 fL (ref 78.0–100.0)
PLATELETS: 349 10*3/uL (ref 150–400)
RBC: 3.54 MIL/uL — ABNORMAL LOW (ref 4.22–5.81)
RDW: 14.8 % (ref 11.5–15.5)
WBC: 10.6 10*3/uL — ABNORMAL HIGH (ref 4.0–10.5)

## 2016-10-22 LAB — BASIC METABOLIC PANEL
Anion gap: 11 (ref 5–15)
BUN: 111 mg/dL — AB (ref 6–20)
CO2: 42 mmol/L — ABNORMAL HIGH (ref 22–32)
CREATININE: 1.75 mg/dL — AB (ref 0.61–1.24)
Calcium: 9.4 mg/dL (ref 8.9–10.3)
Chloride: 101 mmol/L (ref 101–111)
GFR calc Af Amer: 45 mL/min — ABNORMAL LOW (ref >60)
GFR, EST NON AFRICAN AMERICAN: 39 mL/min — AB (ref >60)
GLUCOSE: 139 mg/dL — AB (ref 65–99)
Potassium: 2.9 mmol/L — ABNORMAL LOW (ref 3.5–5.1)
SODIUM: 154 mmol/L — AB (ref 135–145)

## 2016-10-22 LAB — POCT I-STAT 3, ART BLOOD GAS (G3+)
ACID-BASE EXCESS: 18 mmol/L — AB (ref 0.0–2.0)
BICARBONATE: 44.9 mmol/L — AB (ref 20.0–28.0)
O2 SAT: 92 %
PCO2 ART: 66.4 mmHg — AB (ref 32.0–48.0)
PO2 ART: 64 mmHg — AB (ref 83.0–108.0)
Patient temperature: 98.6
TCO2: 47 mmol/L (ref 0–100)
pH, Arterial: 7.438 (ref 7.350–7.450)

## 2016-10-22 LAB — MAGNESIUM: Magnesium: 2.9 mg/dL — ABNORMAL HIGH (ref 1.7–2.4)

## 2016-10-22 LAB — PHOSPHORUS: Phosphorus: 4.1 mg/dL (ref 2.5–4.6)

## 2016-10-22 MED ORDER — DOCUSATE SODIUM 50 MG/5ML PO LIQD
100.0000 mg | Freq: Two times a day (BID) | ORAL | Status: AC
  Administered 2016-10-22 – 2016-10-23 (×4): 100 mg via ORAL
  Filled 2016-10-22 (×5): qty 10

## 2016-10-22 MED ORDER — MAGNESIUM HYDROXIDE 400 MG/5ML PO SUSP
15.0000 mL | Freq: Once | ORAL | Status: AC
  Administered 2016-10-22: 15 mL
  Filled 2016-10-22: qty 30

## 2016-10-22 MED ORDER — FREE WATER
400.0000 mL | Status: DC
  Administered 2016-10-22 – 2016-10-24 (×15): 400 mL

## 2016-10-22 MED ORDER — POTASSIUM CHLORIDE 20 MEQ/15ML (10%) PO SOLN
40.0000 meq | Freq: Once | ORAL | Status: AC
  Administered 2016-10-22: 40 meq
  Filled 2016-10-22: qty 30

## 2016-10-22 NOTE — Progress Notes (Signed)
eLink Physician Progress Note and Electrolyte Replacement  Patient Name: Antonio [name] DOB: 26-Oct-1951 MRN: [id number]  Date of Service  10/22/2016   HPI/Events of Note    Recent Labs Lab 10/18/16 0418  10/19/16 0350 10/20/16 0345 10/21/16 0400 10/21/16 1830 10/22/16 0300  NA 145  --  150* 153* 153* 154* 154*  K 2.7*  < > 2.8* 2.6* 2.6* 3.5 2.9*  CL 94*  --  99* 103 102 103 101  CO2 42*  --  41* 42* 40* 43* 42*  GLUCOSE 238*  --  185* 128* 171* 148* 139*  BUN 73*  --  84* 94* 99* 99* 111*  CREATININE 1.77*  --  1.78* 1.84* 1.68* 1.68* 1.75*  CALCIUM 8.5*  --  8.6* 8.8* 9.0 9.0 9.4  MG 2.4  --  2.5* 2.8* 2.7*  --  2.9*  PHOS 4.2  --  4.1 3.9 3.9  --  4.1  < > = values in this interval not displayed.  Estimated Creatinine Clearance: 66 mL/min (by C-G formula based on SCr of 1.75 mg/dL (H)).  Intake/Output      12/24 0701 - 12/25 0700   I.V. (mL/kg) 860.8 (5.1)   Other 350   NG/GT 1800   IV Piggyback 300   Total Intake(mL/kg) 3310.8 (19.7)   Urine (mL/kg/hr) 5200 (1.3)   Total Output 5200   Net -1889.2        - I/O DETAILED x 24h    Total I/O In: 1245.3 [I.V.:305.3; NG/GT:840; IV Piggyback:100] Out: 1525 [Urine:1525] - I/O THIS SHIFT    ASSESSMENT High Na Low K Stable AKI  eICURN Interventions  increae freee water Replete K   ASSESSMENT: MAJOR ELECTROLYTE      Dr. Kalman Shan, M.D., [facility].C.P Pulmonary and Critical Care Medicine Staff Physician [facility] [place] Pulmonary and Critical Care Pager: [phone], If no answer or between  15:00h - 7:00h: call [phone]  10/22/2016 4:33 AM

## 2016-10-22 NOTE — Progress Notes (Signed)
Rt alerted RN of panic values on ABG

## 2016-10-22 NOTE — Progress Notes (Signed)
PULMONARY / CRITICAL CARE MEDICINE    ADMISSION DATE:  07/28/2016 REFERRING PROVIDER: Dr. Dalene SeltzerSchlossman CHIEF COMPLAINT:  Dyspnea, confusion  HISTORY OF PRESENT ILLNESS:   65 yo male with hx of OSA/OHS and followed by cardiology.  Family reports he has not been able to get set up with PAP therapy at home.  He presented with dyspnea and altered mental status from acute on chronic hypoxic/hypercapnic respiratory failure leading to VDRF.  PMHx of CAD, AS, s/p CABG with AVR 2012, diastolic CHF, DM, HTN.  SUBJECTIVE: No events overnight, weaning on high PS this AM  VITAL SIGNS: BP 129/65   Pulse 75   Temp 98.7 F (37.1 C) (Axillary)   Resp 16   Ht 5\' 11"  (1.803 m)   Wt (!) 207.7 kg (458 lb)   SpO2 (!) 88%   BMI 63.88 kg/m   INTAKE / OUTPUT: I/O last 3 completed shifts: In: 5796.7 [I.V.:1596.7; Other:350; NG/GT:3350; IV Piggyback:500] Out: 9050 [Urine:9050]  PHYSICAL EXAMINATION: General:  Obese man, intubated, sedated Neuro:  Arousable but less than yesterday, moving all ext to command HEENT: ETT, [place]/AT, PERRL, EOM-I Cardiovascular:  Distant , RRR , 1-2 +edema R>L  Lungs:  Crackles bilaterally, diminished bases Abdomen:  Obese but soft, BS+ Musculoskeletal:  No deformities  Skin:  Stasis dermatatic changes in LE , scaling around feet   LABS:  BMET  Recent Labs Lab 10/21/16 0400 10/21/16 1830 10/22/16 0300  NA 153* 154* 154*  K 2.6* 3.5 2.9*  CL 102 103 101  CO2 40* 43* 42*  BUN 99* 99* 111*  CREATININE 1.68* 1.68* 1.75*  GLUCOSE 171* 148* 139*   Electrolytes  Recent Labs Lab 10/20/16 0345 10/21/16 0400 10/21/16 1830 10/22/16 0300  CALCIUM 8.8* 9.0 9.0 9.4  MG 2.8* 2.7*  --  2.9*  PHOS 3.9 3.9  --  4.1    CBC  Recent Labs Lab 10/20/16 0345 10/21/16 0400 10/22/16 0300  WBC 11.1* 9.8 10.6*  HGB 9.3* 9.2* 9.4*  HCT 33.7* 33.3* 33.4*  PLT 306 331 349   Coag's No results for input(s): APTT, INR in the last 168 hours.  Sepsis Markers  Recent  Labs Lab 10/16/16 0745  PROCALCITON 0.34   ABG  Recent Labs Lab 10/20/16 0400 10/21/16 0410 10/22/16 0426  PHART 7.353 7.402 7.438  PCO2ART 77.1* 68.9* 66.4*  PO2ART 61.9* 81.6* 64.0*   Liver Enzymes No results for input(s): AST, ALT, ALKPHOS, BILITOT, ALBUMIN in the last 168 hours. Cardiac Enzymes No results for input(s): TROPONINI, PROBNP in the last 168 hours.  Glucose  Recent Labs Lab 10/22/16 0307 10/22/16 0410 10/22/16 0515 10/22/16 0612 10/22/16 0659 10/22/16 0812  GLUCAP 136* 147* 154* 143* 148* 164*   Imaging Dg Chest Port 1 View  Result Date: 10/22/2016 CLINICAL DATA:  ETT tube aortic valve repair. Hypertension. Hyperlipidemia. Diabetes. EXAM: PORTABLE CHEST 1 VIEW COMPARISON:  One day prior FINDINGS: Endotracheal tube terminates 6.1 cm above carina.Nasogastric tube terminates at the body of the stomach. Prior median sternotomy. Multifactorial degradation, including patient body habitus and minimal motion degradation. Midline trachea. Cardiomegaly accentuated by AP portable technique. Small right pleural effusion. No pneumothorax. Moderate interstitial edema, increased. Persistent bibasilar airspace disease. IMPRESSION: Slight increase in moderate interstitial edema. Multifactorial degradation, as above. Small right pleural effusion with similar bibasilar airspace opacities. Electronically Signed   By: Jeronimo GreavesKyle  Talbot M.D.   On: 10/22/2016 07:32   STUDIES:  Head CT 12/16 >> no acute findings Lower extremity venous dopplers 12/18>> No obvious evidence  of deep vein or superficial thrombosis involving the visualized veins of the right lower   extremity and left lower extremity.  CULTURES: None  ANTIBIOTICS: None  SIGNIFICANT EVENTS: 12/16 Intubated in the ED after failing BiPAP 12/22 Continued SBT falures  LINES/TUBES: ETT 12/16 >>   DISCUSSION: 65 y/o man with obesity hypoventilation syndrome presenting with decompensated respiratory  failure.  ASSESSMENT / PLAN:  Acute on chronic hypoxic/hypercapnic respiratory failure in setting of OSA/OHS. Failed SBT 12/22 - Full vent support - F/u CXR, ABG prn ( CHF with pulmonary interstitial edema) - PRN BDs - Hold further diureses today given UOP and renal function - Family discussion, no trach/peg/dialysis.  Cor pulmonale. Hx of CAD, bioprosthetic AVR, chronic diastolic CHF. - Hold further diureses for today - F/u doppler legs ( Negative for DVT 12/18)  Anemia of critical illness. No obvious signs of bleeding - F/u CBC  Leukocytosis - Follow fever curve and WBC - CXR prn - Culture for temp of >101.5  DM. - SSI - Insulin gtt  Hx of hypothyroidism. - Synthroid  Acute metabolic encephalopathy. - RASS goal 0  Renal failure: - Allow lasix drip to expire - Replete electrolytes as indicated - BMET later on today and in AM  Sedation: - Fentanyl drip - PRN versed - Seroquel to minimize versed need, increase to 50 BID  DVT prophylaxis - lovenox SUP - pepcid Nutrition - protonix/ Tube feeds Goals of care - LCB with no CPR/cardioversion/dialysis/trach/peg.  Daughter and sister updated at length bedside  The patient is critically ill with multiple organ systems failure and requires high complexity decision making for assessment and support, frequent evaluation and titration of therapies, application of advanced monitoring technologies and extensive interpretation of multiple databases.   Critical Care Time devoted to patient care services described in this note is  35  Minutes. This time reflects time of care of this signee Dr Koren Bound. This critical care time does not reflect procedure time, or teaching time or supervisory time of PA/NP/Med student/Med Resident etc but could involve care discussion time.  Alyson Reedy, M.D. [facility] Pulmonary/Critical Care Medicine. Pager: [phone]. After hours pager: [phone].

## 2016-10-22 NOTE — Progress Notes (Signed)
RT note- Patient does not meet requirements for wean, however, attempted PS+16/PEEP +8 fio2 at 50%, tolerated wean for 2 hours. Remains on full support at this time, continue to monitor.

## 2016-10-23 ENCOUNTER — Inpatient Hospital Stay (HOSPITAL_COMMUNITY): Payer: Medicare Other

## 2016-10-23 LAB — GLUCOSE, CAPILLARY
GLUCOSE-CAPILLARY: 103 mg/dL — AB (ref 65–99)
GLUCOSE-CAPILLARY: 128 mg/dL — AB (ref 65–99)
GLUCOSE-CAPILLARY: 144 mg/dL — AB (ref 65–99)
GLUCOSE-CAPILLARY: 150 mg/dL — AB (ref 65–99)
GLUCOSE-CAPILLARY: 150 mg/dL — AB (ref 65–99)
GLUCOSE-CAPILLARY: 159 mg/dL — AB (ref 65–99)
GLUCOSE-CAPILLARY: 161 mg/dL — AB (ref 65–99)
GLUCOSE-CAPILLARY: 167 mg/dL — AB (ref 65–99)
GLUCOSE-CAPILLARY: 171 mg/dL — AB (ref 65–99)
GLUCOSE-CAPILLARY: 181 mg/dL — AB (ref 65–99)
GLUCOSE-CAPILLARY: 181 mg/dL — AB (ref 65–99)
GLUCOSE-CAPILLARY: 183 mg/dL — AB (ref 65–99)
GLUCOSE-CAPILLARY: 194 mg/dL — AB (ref 65–99)
Glucose-Capillary: 176 mg/dL — ABNORMAL HIGH (ref 65–99)
Glucose-Capillary: 165 mg/dL — ABNORMAL HIGH (ref 65–99)
Glucose-Capillary: 143 mg/dL — ABNORMAL HIGH (ref 65–99)
Glucose-Capillary: 151 mg/dL — ABNORMAL HIGH (ref 65–99)
Glucose-Capillary: 158 mg/dL — ABNORMAL HIGH (ref 65–99)
Glucose-Capillary: 184 mg/dL — ABNORMAL HIGH (ref 65–99)
Glucose-Capillary: 187 mg/dL — ABNORMAL HIGH (ref 65–99)
Glucose-Capillary: 153 mg/dL — ABNORMAL HIGH (ref 65–99)
Glucose-Capillary: 122 mg/dL — ABNORMAL HIGH (ref 65–99)
Glucose-Capillary: 163 mg/dL — ABNORMAL HIGH (ref 65–99)
Glucose-Capillary: 188 mg/dL — ABNORMAL HIGH (ref 65–99)
Glucose-Capillary: 151 mg/dL — ABNORMAL HIGH (ref 65–99)

## 2016-10-23 LAB — BASIC METABOLIC PANEL
ANION GAP: 13 (ref 5–15)
BUN: 120 mg/dL — ABNORMAL HIGH (ref 6–20)
CO2: 40 mmol/L — AB (ref 22–32)
Calcium: 9.4 mg/dL (ref 8.9–10.3)
Chloride: 101 mmol/L (ref 101–111)
Creatinine, Ser: 1.7 mg/dL — ABNORMAL HIGH (ref 0.61–1.24)
GFR calc Af Amer: 47 mL/min — ABNORMAL LOW (ref >60)
GFR calc non Af Amer: 40 mL/min — ABNORMAL LOW (ref >60)
Glucose, Bld: 190 mg/dL — ABNORMAL HIGH (ref 65–99)
POTASSIUM: 2.6 mmol/L — AB (ref 3.5–5.1)
Sodium: 154 mmol/L — ABNORMAL HIGH (ref 135–145)

## 2016-10-23 LAB — CBC
HEMATOCRIT: 32.4 % — AB (ref 39.0–52.0)
Hemoglobin: 9 g/dL — ABNORMAL LOW (ref 13.0–17.0)
MCH: 25.8 pg — AB (ref 26.0–34.0)
MCHC: 27.8 g/dL — ABNORMAL LOW (ref 30.0–36.0)
MCV: 92.8 fL (ref 78.0–100.0)
Platelets: 354 10*3/uL (ref 150–400)
RBC: 3.49 MIL/uL — AB (ref 4.22–5.81)
RDW: 15 % (ref 11.5–15.5)
WBC: 11.2 10*3/uL — AB (ref 4.0–10.5)

## 2016-10-23 LAB — BLOOD GAS, ARTERIAL
ACID-BASE EXCESS: 17.4 mmol/L — AB (ref 0.0–2.0)
BICARBONATE: 43.3 mmol/L — AB (ref 20.0–28.0)
DRAWN BY: 41875
FIO2: 50
LHR: 12 {breaths}/min
MECHVT: 600 mL
O2 SAT: 92.8 %
PEEP/CPAP: 5 cmH2O
PO2 ART: 69.7 mmHg — AB (ref 83.0–108.0)
Patient temperature: 98.6
pCO2 arterial: 73.2 mmHg (ref 32.0–48.0)
pH, Arterial: 7.39 (ref 7.350–7.450)

## 2016-10-23 LAB — MAGNESIUM: Magnesium: 2.9 mg/dL — ABNORMAL HIGH (ref 1.7–2.4)

## 2016-10-23 LAB — PHOSPHORUS: Phosphorus: 4.4 mg/dL (ref 2.5–4.6)

## 2016-10-23 MED ORDER — ACETAZOLAMIDE SODIUM 500 MG IJ SOLR
250.0000 mg | Freq: Four times a day (QID) | INTRAMUSCULAR | Status: AC
  Administered 2016-10-23 – 2016-10-24 (×3): 250 mg via INTRAVENOUS
  Filled 2016-10-23 (×4): qty 250

## 2016-10-23 MED ORDER — FUROSEMIDE 10 MG/ML IJ SOLN
10.0000 mg/h | INTRAVENOUS | Status: AC
  Administered 2016-10-23: 10 mg/h via INTRAVENOUS
  Filled 2016-10-23 (×2): qty 25

## 2016-10-23 MED ORDER — SODIUM CHLORIDE 0.9 % IV SOLN
Freq: Once | INTRAVENOUS | Status: AC
  Administered 2016-10-23: 06:00:00 via INTRAVENOUS
  Filled 2016-10-23: qty 1000

## 2016-10-23 MED ORDER — ALBUMIN HUMAN 25 % IV SOLN
25.0000 g | Freq: Four times a day (QID) | INTRAVENOUS | Status: AC
Start: 2016-10-23 — End: 2016-10-24
  Administered 2016-10-23 – 2016-10-24 (×4): 25 g via INTRAVENOUS
  Filled 2016-10-23: qty 50
  Filled 2016-10-23: qty 150
  Filled 2016-10-23: qty 100
  Filled 2016-10-23 (×3): qty 50

## 2016-10-23 MED ORDER — MAGNESIUM HYDROXIDE 400 MG/5ML PO SUSP
15.0000 mL | Freq: Every day | ORAL | Status: DC
  Administered 2016-10-23 – 2016-10-24 (×2): 15 mL
  Filled 2016-10-23 (×2): qty 30

## 2016-10-23 MED ORDER — METOLAZONE 5 MG PO TABS
10.0000 mg | ORAL_TABLET | Freq: Once | ORAL | Status: AC
  Administered 2016-10-23: 10 mg via ORAL
  Filled 2016-10-23: qty 2

## 2016-10-23 MED ORDER — MINERAL OIL RE ENEM
1.0000 | ENEMA | Freq: Once | RECTAL | Status: AC
  Administered 2016-10-23: 1 via RECTAL
  Filled 2016-10-23: qty 1

## 2016-10-23 MED ORDER — POTASSIUM CHLORIDE 20 MEQ/15ML (10%) PO SOLN
40.0000 meq | Freq: Four times a day (QID) | ORAL | Status: AC
  Administered 2016-10-23 (×3): 40 meq
  Filled 2016-10-23 (×3): qty 30

## 2016-10-23 NOTE — Progress Notes (Signed)
RT note- Patient changed over to full support due to low sp02 83%.

## 2016-10-23 NOTE — Progress Notes (Signed)
CRITICAL VALUE ALERT  Critical value received:  Co2 73.2  Date of notification:  10/23/16   Time of notification:  0400  Critical value read back: Yes  Nurse who received alert:  Feliberto Gottron RN  MD notified (1st page):  E-Link MD  Time of first page:  0430  MD notified (2nd page):  Time of second page:  Responding MD:  E-Link MD  Time MD responded:  [phone]

## 2016-10-23 NOTE — Progress Notes (Signed)
Attempted to wean patient off fentanyl. Medication was off medication for a short period of time. Family persistent on keeping medication going. Stated they want him "comfortable and not moving." Attempted to educate family and family interrupted RN multiple times. Family upset when he moves his feet or arms, state that it "means daddy is in pain."

## 2016-10-23 NOTE — Progress Notes (Signed)
CPT not done via bed, asleep upon RT arrival.

## 2016-10-23 NOTE — Progress Notes (Signed)
PULMONARY / CRITICAL CARE MEDICINE    ADMISSION DATE:  October 21, 2016 REFERRING PROVIDER: Dr. Dalene Seltzer CHIEF COMPLAINT:  Dyspnea, confusion  HISTORY OF PRESENT ILLNESS:   65 yo male with hx of OSA/OHS and followed by cardiology.  Family reports he has not been able to get set up with PAP therapy at home.  He presented with dyspnea and altered mental status from acute on chronic hypoxic/hypercapnic respiratory failure leading to VDRF.  PMHx of CAD, AS, s/p CABG with AVR 2012, diastolic CHF, DM, HTN.  SUBJECTIVE: No events overnight, weaning on high PS this AM  VITAL SIGNS: BP 127/68   Pulse 84   Temp 99.4 F (37.4 C)   Resp (!) 30   Ht 5\' 11"  (1.803 m)   Wt (!) 205.9 kg (454 lb)   SpO2 91%   BMI 63.32 kg/m   INTAKE / OUTPUT: I/O last 3 completed shifts: In: 6376.3 [I.V.:1326.3; NG/GT:4850; IV Piggyback:200] Out: 6500 [Urine:6500]  PHYSICAL EXAMINATION: General:  Obese man, intubated, sedated Neuro:  Arousable but less than yesterday, moving all ext to command HEENT: ETT, [place]/AT, PERRL, EOM-I Cardiovascular:  Distant , RRR , 1-2 +edema R>L  Lungs:  Crackles bilaterally, diminished bases Abdomen:  Obese but soft, BS+ Musculoskeletal:  No deformities  Skin:  Stasis dermatatic changes in LE , scaling around feet   LABS:  BMET  Recent Labs Lab 10/21/16 1830 10/22/16 0300 10/23/16 0421  NA 154* 154* 154*  K 3.5 2.9* 2.6*  CL 103 101 101  CO2 43* 42* 40*  BUN 99* 111* 120*  CREATININE 1.68* 1.75* 1.70*  GLUCOSE 148* 139* 190*   Electrolytes  Recent Labs Lab 10/21/16 0400 10/21/16 1830 10/22/16 0300 10/23/16 0421  CALCIUM 9.0 9.0 9.4 9.4  MG 2.7*  --  2.9* 2.9*  PHOS 3.9  --  4.1 4.4    CBC  Recent Labs Lab 10/21/16 0400 10/22/16 0300 10/23/16 0421  WBC 9.8 10.6* 11.2*  HGB 9.2* 9.4* 9.0*  HCT 33.3* 33.4* 32.4*  PLT 331 349 354   Coag's No results for input(s): APTT, INR in the last 168 hours.  Sepsis Markers No results for input(s):  LATICACIDVEN, PROCALCITON, O2SATVEN in the last 168 hours. ABG  Recent Labs Lab 10/21/16 0410 10/22/16 0426 10/23/16 0315  PHART 7.402 7.438 7.390  PCO2ART 68.9* 66.4* 73.2*  PO2ART 81.6* 64.0* 69.7*   Liver Enzymes No results for input(s): AST, ALT, ALKPHOS, BILITOT, ALBUMIN in the last 168 hours. Cardiac Enzymes No results for input(s): TROPONINI, PROBNP in the last 168 hours.  Glucose  Recent Labs Lab 10/23/16 0513 10/23/16 0605 10/23/16 0716 10/23/16 0820 10/23/16 0917 10/23/16 1021  GLUCAP 183* 159* 143* 151* 150* 158*   Imaging Dg Chest Port 1 View  Result Date: 10/23/2016 CLINICAL DATA:  Intubation. EXAM: PORTABLE CHEST 1 VIEW COMPARISON:  10/22/2016 . FINDINGS: Endotracheal tube and NG tube in stable position. Cardiac valve replacement. Cardiomegaly with bilateral pulmonary infiltrates consistent with pulmonary edema. Low lung volumes with basilar atelectasis again noted . Small pleural effusions. No pneumothorax. IMPRESSION: 1.  Lines and tubes in stable position. 2. Prior cardiac valve replacement. Cardiomegaly with diffuse bilateral pulmonary infiltrates consistent pulmonary edema. Slight progression from prior exam. Low lung volumes with basilar atelectasis again noted. Small bilateral pleural effusions. Electronically Signed   By: Maisie Fus  Register   On: 10/23/2016 07:37   STUDIES:  Head CT 12/16 >> no acute findings Lower extremity venous dopplers 12/18>> No obvious evidence of deep vein or superficial thrombosis  involving the visualized veins of the right lower   extremity and left lower extremity.  CULTURES: None  ANTIBIOTICS: None  SIGNIFICANT EVENTS: 12/16 Intubated in the ED after failing BiPAP 12/22 Continued SBT falures  LINES/TUBES: ETT 12/16>>> R IJ TLC 12/18>>>   DISCUSSION: 65 y/o man with obesity hypoventilation syndrome presenting with decompensated respiratory failure.  ASSESSMENT / PLAN:  Acute on chronic hypoxic/hypercapnic  respiratory failure in setting of OSA/OHS. Failed SBT 12/22 - Begin PS trials but no extubation - F/u CXR, ABG prn ( CHF with pulmonary interstitial edema) - PRN BDs - Diureses as belwo - Family discussion, no trach/peg/dialysis.  Their goal is to extubate and have some time with the patient. - Palliative care consult  Cor pulmonale. Hx of CAD, bioprosthetic AVR, chronic diastolic CHF. - Lasix 10 mg/hr x24 hours - Acetazolamide 250 mg IV q6 x3 doses - Zaroxolyn 10 mg PO x1 dose - Active K replacement - F/u doppler legs ( Negative for DVT 12/18)  Anemia of critical illness. No obvious signs of bleeding - F/u CBC  Leukocytosis - Follow fever curve and WBC - CXR prn - Culture for temp of >101.5  DM. - SSI - Insulin gtt  Hx of hypothyroidism. - Synthroid  Acute metabolic encephalopathy. - RASS goal 0  Renal failure: - Allow lasix drip to expire - Replete electrolytes as indicated - BMET later on today and in AM  Sedation: - Fentanyl drip - PRN versed - Seroquel to minimize versed need, increase to 50 BID  DVT prophylaxis - lovenox SUP - pepcid Nutrition - protonix/ Tube feeds Goals of care - LCB with no CPR/cardioversion/dialysis/trach/peg.  Daughter and sister updated at length bedside  The patient is critically ill with multiple organ systems failure and requires high complexity decision making for assessment and support, frequent evaluation and titration of therapies, application of advanced monitoring technologies and extensive interpretation of multiple databases.   Critical Care Time devoted to patient care services described in this note is  35  Minutes. This time reflects time of care of this signee Dr Koren BoundWesam Carisma Troupe. This critical care time does not reflect procedure time, or teaching time or supervisory time of PA/NP/Med student/Med Resident etc but could involve care discussion time.  Alyson ReedyWesam G. Moksha Dorgan, M.D. [facility] Pulmonary/Critical Care  Medicine. Pager: [phone]. After hours pager: [phone].

## 2016-10-23 NOTE — Progress Notes (Signed)
CRITICAL VALUE ALERT  Critical value received: K 2.6  Date of notification:  10/23/16   Time of notification:  0455  Critical value read back: Yes  Nurse who received alert:  Feliberto Gottron RN  MD notified (1st page):  E-Link MD  Time of first page:  0503  MD notified (2nd page):  Time of second page:  Responding MD:  E-Link MD  Time MD responded:  [phone]

## 2016-10-23 NOTE — Care Management Important Message (Signed)
Important Message  Patient Details  Name: Antonio [name] MRN: [id number] Date of Birth: 04-14-1951   Medicare Important Message Given:  Yes    Dorena Bodo 10/23/2016, 12:15 PM

## 2016-10-23 NOTE — Progress Notes (Signed)
eLink Physician-Brief Progress Note Patient Name: Antonio [name] DOB: 1951/10/15 MRN: [id number]   Date of Service  10/23/2016  HPI/Events of Note  K=2.6  eICU Interventions  Replace k     Intervention Category Major Interventions: Electrolyte abnormality - evaluation and management  Demaris Bousquet 10/23/2016, 5:03 AM

## 2016-10-23 NOTE — Progress Notes (Signed)
RN aware of panic value on morning ABG

## 2016-10-24 ENCOUNTER — Inpatient Hospital Stay (HOSPITAL_COMMUNITY): Payer: Medicare Other

## 2016-10-24 DIAGNOSIS — Z7189 Other specified counseling: Secondary | ICD-10-CM

## 2016-10-24 DIAGNOSIS — J810 Acute pulmonary edema: Secondary | ICD-10-CM

## 2016-10-24 DIAGNOSIS — J9621 Acute and chronic respiratory failure with hypoxia: Secondary | ICD-10-CM

## 2016-10-24 DIAGNOSIS — Z515 Encounter for palliative care: Secondary | ICD-10-CM

## 2016-10-24 LAB — BASIC METABOLIC PANEL
ANION GAP: 10 (ref 5–15)
Anion gap: 11 (ref 5–15)
BUN: 101 mg/dL — AB (ref 6–20)
BUN: 125 mg/dL — ABNORMAL HIGH (ref 6–20)
CALCIUM: 7.3 mg/dL — AB (ref 8.9–10.3)
CALCIUM: 9.2 mg/dL (ref 8.9–10.3)
CO2: 29 mmol/L (ref 22–32)
CO2: 41 mmol/L — ABNORMAL HIGH (ref 22–32)
CREATININE: 1.25 mg/dL — AB (ref 0.61–1.24)
Chloride: 114 mmol/L — ABNORMAL HIGH (ref 101–111)
Chloride: 102 mmol/L (ref 101–111)
Creatinine, Ser: 1.52 mg/dL — ABNORMAL HIGH (ref 0.61–1.24)
GFR calc Af Amer: >60 mL/min (ref >60)
GFR, EST AFRICAN AMERICAN: 53 mL/min — AB (ref >60)
GFR, EST NON AFRICAN AMERICAN: 58 mL/min — AB (ref >60)
GFR, EST NON AFRICAN AMERICAN: 46 mL/min — AB (ref >60)
GLUCOSE: 123 mg/dL — AB (ref 65–99)
Glucose, Bld: 192 mg/dL — ABNORMAL HIGH (ref 65–99)
POTASSIUM: 2.3 mmol/L — AB (ref 3.5–5.1)
Potassium: 3 mmol/L — ABNORMAL LOW (ref 3.5–5.1)
SODIUM: 154 mmol/L — AB (ref 135–145)
SODIUM: 153 mmol/L — AB (ref 135–145)

## 2016-10-24 LAB — BLOOD GAS, ARTERIAL
Acid-Base Excess: 15 mmol/L — ABNORMAL HIGH (ref 0.0–2.0)
Bicarbonate: 40.9 mmol/L — ABNORMAL HIGH (ref 20.0–28.0)
Drawn by: 225631
FIO2: 40
O2 SAT: 88.7 %
PATIENT TEMPERATURE: 98.6
PCO2 ART: 72 mmHg — AB (ref 32.0–48.0)
PEEP: 5 cmH2O
RATE: 12 resp/min
VT: 0.6 mL
pH, Arterial: 7.373 (ref 7.350–7.450)
pO2, Arterial: 62.3 mmHg — ABNORMAL LOW (ref 83.0–108.0)

## 2016-10-24 LAB — CBC
HCT: 32.1 % — ABNORMAL LOW (ref 39.0–52.0)
HEMATOCRIT: 23 % — AB (ref 39.0–52.0)
HEMATOCRIT: 33.5 % — AB (ref 39.0–52.0)
HEMOGLOBIN: 8.9 g/dL — AB (ref 13.0–17.0)
Hemoglobin: 6.4 g/dL — CL (ref 13.0–17.0)
Hemoglobin: 9.3 g/dL — ABNORMAL LOW (ref 13.0–17.0)
MCH: 26.7 pg (ref 26.0–34.0)
MCH: 25.7 pg — AB (ref 26.0–34.0)
MCH: 26.3 pg (ref 26.0–34.0)
MCHC: 27.8 g/dL — AB (ref 30.0–36.0)
MCHC: 27.7 g/dL — AB (ref 30.0–36.0)
MCHC: 27.8 g/dL — AB (ref 30.0–36.0)
MCV: 95.8 fL (ref 78.0–100.0)
MCV: 92.8 fL (ref 78.0–100.0)
MCV: 94.9 fL (ref 78.0–100.0)
PLATELETS: 228 10*3/uL (ref 150–400)
Platelets: 377 10*3/uL (ref 150–400)
Platelets: 357 10*3/uL (ref 150–400)
RBC: 2.4 MIL/uL — ABNORMAL LOW (ref 4.22–5.81)
RBC: 3.46 MIL/uL — ABNORMAL LOW (ref 4.22–5.81)
RBC: 3.53 MIL/uL — ABNORMAL LOW (ref 4.22–5.81)
RDW: 15.3 % (ref 11.5–15.5)
RDW: 14.9 % (ref 11.5–15.5)
RDW: 15.3 % (ref 11.5–15.5)
WBC: 8.7 10*3/uL (ref 4.0–10.5)
WBC: 12.9 10*3/uL — ABNORMAL HIGH (ref 4.0–10.5)
WBC: 13.7 10*3/uL — ABNORMAL HIGH (ref 4.0–10.5)

## 2016-10-24 LAB — MAGNESIUM
MAGNESIUM: 3 mg/dL — AB (ref 1.7–2.4)
Magnesium: 2.3 mg/dL (ref 1.7–2.4)

## 2016-10-24 LAB — GLUCOSE, CAPILLARY
GLUCOSE-CAPILLARY: 150 mg/dL — AB (ref 65–99)
GLUCOSE-CAPILLARY: 71 mg/dL (ref 65–99)
GLUCOSE-CAPILLARY: 167 mg/dL — AB (ref 65–99)
Glucose-Capillary: 126 mg/dL — ABNORMAL HIGH (ref 65–99)
Glucose-Capillary: 146 mg/dL — ABNORMAL HIGH (ref 65–99)
Glucose-Capillary: 166 mg/dL — ABNORMAL HIGH (ref 65–99)
Glucose-Capillary: 194 mg/dL — ABNORMAL HIGH (ref 65–99)
Glucose-Capillary: 195 mg/dL — ABNORMAL HIGH (ref 65–99)

## 2016-10-24 LAB — PHOSPHORUS
PHOSPHORUS: 3.3 mg/dL (ref 2.5–4.6)
PHOSPHORUS: 4.7 mg/dL — AB (ref 2.5–4.6)

## 2016-10-24 MED ORDER — POLYVINYL ALCOHOL 1.4 % OP SOLN: 1.0000 [drp] | Freq: Four times a day (QID) | OPHTHALMIC | Status: DC | PRN

## 2016-10-24 MED ORDER — HALOPERIDOL LACTATE 2 MG/ML PO CONC: 0.5000 mg | ORAL | Status: DC | PRN

## 2016-10-24 MED ORDER — MIDAZOLAM HCL 2 MG/2ML IJ SOLN
1.0000 mg | INTRAMUSCULAR | Status: DC | PRN
  Administered 2016-10-24: 2 mg via INTRAVENOUS
  Filled 2016-10-24 (×2): qty 2

## 2016-10-24 MED ORDER — SODIUM CHLORIDE 0.9 % IV SOLN
6.0000 mg/h | INTRAVENOUS | Status: DC
  Administered 2016-10-24: 2 mg/h via INTRAVENOUS
  Filled 2016-10-24 (×2): qty 10

## 2016-10-24 MED ORDER — GLYCOPYRROLATE 0.2 MG/ML IJ SOLN: 0.2000 mg | INTRAMUSCULAR | Status: DC | PRN

## 2016-10-24 MED ORDER — SODIUM CHLORIDE 0.9 % IV SOLN: Freq: Once | INTRAVENOUS | Status: DC

## 2016-10-24 MED ORDER — MIDAZOLAM BOLUS VIA INFUSION
2.0000 mg | INTRAVENOUS | Status: DC | PRN
  Filled 2016-10-24: qty 2

## 2016-10-24 MED ORDER — SODIUM CHLORIDE 0.9 % IV SOLN
15.0000 mg/h | INTRAVENOUS | Status: DC
  Administered 2016-10-24: 10 mg/h via INTRAVENOUS
  Filled 2016-10-24: qty 10

## 2016-10-24 MED ORDER — MORPHINE BOLUS VIA INFUSION
10.0000 mg | INTRAVENOUS | Status: DC | PRN
  Filled 2016-10-24: qty 20

## 2016-10-24 MED ORDER — FUROSEMIDE 10 MG/ML IJ SOLN: 40.0000 mg | INTRAMUSCULAR | Status: DC

## 2016-10-24 MED ORDER — MORPHINE BOLUS VIA INFUSION
5.0000 mg | INTRAVENOUS | Status: DC | PRN
  Filled 2016-10-24: qty 20

## 2016-10-24 MED ORDER — FENTANYL BOLUS VIA INFUSION
100.0000 ug | INTRAVENOUS | Status: DC | PRN
  Filled 2016-10-24: qty 100

## 2016-10-24 MED ORDER — HALOPERIDOL 0.5 MG PO TABS: 0.5000 mg | ORAL_TABLET | ORAL | Status: DC | PRN

## 2016-10-24 MED ORDER — HALOPERIDOL LACTATE 5 MG/ML IJ SOLN: 0.5000 mg | INTRAMUSCULAR | Status: DC | PRN

## 2016-10-24 MED ORDER — POTASSIUM CHLORIDE 20 MEQ/15ML (10%) PO SOLN
40.0000 meq | ORAL | Status: DC
  Administered 2016-10-24 (×2): 40 meq
  Filled 2016-10-24 (×2): qty 30

## 2016-10-24 MED ORDER — GLYCOPYRROLATE 0.2 MG/ML IJ SOLN
0.4000 mg | Freq: Three times a day (TID) | INTRAMUSCULAR | Status: DC
  Administered 2016-10-24: 0.4 mg via INTRAVENOUS
  Filled 2016-10-24 (×3): qty 2

## 2016-10-24 MED ORDER — GLYCOPYRROLATE 1 MG PO TABS: 1.0000 mg | ORAL_TABLET | ORAL | Status: DC | PRN

## 2016-10-24 MED ORDER — POTASSIUM CHLORIDE CRYS ER 20 MEQ PO TBCR: 40.0000 meq | EXTENDED_RELEASE_TABLET | Freq: Once | ORAL | Status: AC

## 2016-10-24 MED ORDER — BIOTENE DRY MOUTH MT LIQD: 15.0000 mL | OROMUCOSAL | Status: DC | PRN

## 2016-10-25 LAB — GLUCOSE, CAPILLARY: GLUCOSE-CAPILLARY: 204 mg/dL — AB (ref 65–99)

## 2016-10-26 ENCOUNTER — Telehealth: Payer: Self-pay

## 2016-10-26 NOTE — Telephone Encounter (Signed)
On 2016/11/13 I received a death certificate (faxed copy)  from [facility].. The death certificate is for cremation. The patient is a patient of Doctor Molli Knock. The death certificate will be taken to [facility] (2300) this am for signature.  On 11-13-2016 I received the death certificate back from Doctor Molli Knock. I got the death certificate ready and faxed the death certificate to the funeral homer per the funeral home request.

## 2016-10-26 NOTE — Telephone Encounter (Signed)
O 10/26/2016 I received a death certificate from [facility] (original). The death certificate is for cremation. The patient is a patient of Doctor Molli Knock.   On 11/09/16 I received the death certificate back from [facility] [place]. I got the death certificate ready and called the funeral home to let them know the death certificate is ready for pickup.

## 2016-10-29 NOTE — Progress Notes (Signed)
Palliative Medicine RN Note: PMT PA M York contacted me about materials to help with children and grief; PMT has no endorsed information. If pt survives extubation, family would like him to go to [facility]; called their bereavement department.   If the pt ends up there, they will definitely be involved in supporting the family. However, if pt passes away before being able to get to hospice, they recommend that the family find a NON-PROFIT hospice at home (they live in [place]), which should have a bereavement team that is accessible to the community. In the interim, the [facility] bereavement dept rep stated she will email me handouts to give to the family. Once these are rec'd, I will bring them to the family.  Antonio Chance [name], RN, BSN, [facility] 10/22/2016 2:01 PM Cell [phone] 8:00-4:00 Monday-Friday Office [phone]

## 2016-10-29 NOTE — Progress Notes (Addendum)
150 mls of morphone wasted 200 mls of fentanyl wasted 20 mls of versed wasted  All meds wasted in sink with Lonia Blood, RN.   Hermina Barters, RN

## 2016-10-29 NOTE — Progress Notes (Signed)
Palliative Medicine RN Note: At bedside for extubation with M York, [place]. Versed and morphine were initiated prior to extubation, and pt also rec'd prn doses prior to extubation. Pt had some labored breathing immediately post-extubation, but this was relieved with aggressive symptom management by the RN and M York. Family is at bedside. They are tearful, but they verbalize understanding of medications, treatments, and likely imminent demise.  Margret Chance Markisha Meding, RN, BSN, [facility] 11/04/2016 4:09 PM Cell [phone] 8:00-4:00 Monday-Friday Office [phone]

## 2016-10-29 NOTE — Procedures (Signed)
Extubation Procedure Note  Patient Details:   Name: Merick Lapid DOB: 02-Dec-1950 MRN: [id number]   Airway Documentation: Extubated to 2 lpm [place] at family request.      Evaluation  O2 sats: stable throughout Complications: No apparent complications Patient did not tolerate procedure well. Bilateral Breath Sounds: Diminished, Rhonchi   No  Richmond Campbell 2016/11/22, 3:59 PM

## 2016-10-29 NOTE — Progress Notes (Signed)
Checked back w/ pt's former wife to ensure that Cath priest Fr. Kieth Brightly had administered the last rites to pt. He had. She said pt had been extubated, was still with [place], thanked me very much for taking care of ensuring that the priest came when he did, and they did not need anything else at this time. Chaplain available for follow-up.    11-19-2016 1400  Clinical Encounter Type  Visited With Family  Visit Type Follow-up;Psychological support;Spiritual support;Social support;Other (Comment) (end of life)  Referral From Chaplain  Spiritual Encounters  Spiritual Needs Emotional;Grief support  Stress Factors  Family Stress Factors Loss   Ephraim Hamburger, [address]

## 2016-10-29 NOTE — Progress Notes (Signed)
No charge note  Patient extubated without distress.  Family at bedside.   Currently on morphine gtt and versed gtt with scheduled robinul.  Greatly appreciate Nursing.  Algis Downs, [place] Palliative Medicine Pager: [phone]

## 2016-10-29 NOTE — Progress Notes (Signed)
Requested by colleague to ask Catholic priest -- already sought for 48M family for administration of last rites before 48M pt will also be extubated and transitioned to comfort care -- to come for similar purpose to Mr. Gilles Chiquito. FrSeymour Bars, visiting fill-in for [facility] Benedict's on-call pastor (latter Is out of town for holidays), will come as soon as he says noon Mass -- so will be at [facility] b/t 1:30 and 2 p.m.   Fr. Seymour Bars  will come to Mr. Gilles Chiquito and his family as soon as he finishes administering this Sacrament of the Sick to the 48M pt and afterwards comforting his family. Palliative nurse was momentairily confused by [employer] (lay male) Investment banker, corporate who came by this morning per usual M, W, F schedule of administering Communion to Cath pts, thinking Mr. Thurston Pounds spiritual needs were then met. But they will not be until the priest comes.   We discussed how only [place] priests (all of whom are presently male) can administer the last rites -- which is very important to a sense of spiritual wholeness/completeness to Catholic families when a pt is dying b/c it's like going to confession for the pt. Thus, even if pt is unconscious, the [place] is spiritually healing (if not physically), and they receive forgiveness of sin (so die in a state of grace). Since that is the belief, the Sacrament is only administered to a living person. Thus, in such situations for Cath  pts, it's important that the Sacrament is received before the transition to comfort care.  Provided spiritual/emotional support to 3 family members, including former wife (mother of pt's children, whose husband is with her) and one of their daughters. (The other just went home to rest for a while.) Explained Fr. Adrien's schedule, and asked them to page for a chaplain fi no priest had arrived by 2:40-2:45 p.m. Chaplain available for f/u.    2016/10/29 1200  Clinical Encounter Type  Visited With Patient and family together;Health care provider  Visit  Type Initial;Psychological support;Spiritual support;Social support  Referral From [place] Needs Prayer;Ritual;Emotional;Grief support  Stress Factors  Patient Stress Factors Other (Comment) (end of life)  Family Stress Factors Loss   Gerrit Heck, Chaplain

## 2016-10-29 NOTE — Progress Notes (Signed)
Time of death confirmed with Algis Downs, PA at [phone].  Hermina Barters, RN

## 2016-10-29 NOTE — Progress Notes (Signed)
eLink Physician-Brief Progress Note Patient Name: Antonio [name] DOB: [birth year] MRN: [id number]   Date of Service  10/11/2016  HPI/Events of Note   Recent Labs Lab 10/23/16 0421 10/07/2016 0400 09/29/2016 0524  HGB 9.0* 6.4* 8.9*  HCT 32.4* 23.0* 32.1*  WBC 11.2* 8.7 12.9*  PLT 354 228 377   Repeat hgb is 8.9gm%  eICU Interventions  Dc prbc order Recheck cbc at 9am and then decide on restart lovenox -     Intervention Category Major Interventions: Other:  Venkat Ankney 10/06/2016, 6:37 AM

## 2016-10-29 NOTE — Progress Notes (Signed)
Patient ID: Antonio [name], male   DOB: 10-Mar-1951, 66 y.o.   MRN: [id number] \  eLink Physician Progress Note and Electrolyte Replacement  Patient Name: Antonio [name] DOB: November 08, 1950 MRN: [id number]  Date of Service  10/08/2016   HPI/Events of Note    Recent Labs Lab 10/23/16 0421 10/21/2016 0400 10/25/2016 0524  HGB 9.0* 6.4* 8.9*  HCT 32.4* 23.0* 32.1*  WBC 11.2* 8.7 12.9*  PLT 354 228 377   - paitent on lovenox 80mg  / day. Per RN no bp/hr issue. No actibv bleedig   Recent Labs Lab 10/20/16 0345 10/21/16 0400 10/21/16 1830 10/22/16 0300 10/23/16 0421 10/17/2016 0400  NA 153* 153* 154* 154* 154* 154*  K 2.6* 2.6* 3.5 2.9* 2.6* 2.3*  CL 103 102 103 101 101 114*  CO2 42* 40* 43* 42* 40* 29  GLUCOSE 128* 171* 148* 139* 190* 123*  BUN 94* 99* 99* 111* 120* 101*  CREATININE 1.84* 1.68* 1.68* 1.75* 1.70* 1.25*  CALCIUM 8.8* 9.0 9.0 9.4 9.4 7.3*  MG 2.8* 2.7*  --  2.9* 2.9* 2.3  PHOS 3.9 3.9  --  4.1 4.4 3.3    Estimated Creatinine Clearance: 104.8 mL/min (by C-G formula based on SCr of 1.25 mg/dL (H)).  Intake/Output      12/26 0701 - 12/27 0700   I.V. (mL/kg) 1748.2 (8.5)   NG/GT 2390   IV Piggyback 250   Total Intake(mL/kg) 4388.2 (21.3)   Urine (mL/kg/hr) 4800 (1)   Emesis/NG output 0 (0)   Total Output 4800   Net -411.8        - I/O DETAILED x 24h    Total I/O In: 1032.1 [I.V.:332.1; NG/GT:600; IV Piggyback:100] Out: 1980 [Urine:1980] - I/O THIS SHIFT    ASSESSMENT Anemia - likely critical illness but profile borderline for bleeding Severe low k- due to lasix gtt  eICURN Interventions  1 unit prbc Hold lovenox Replete K   ASSESSMENT: MAJOR ELECTROLYTE      Dr. Kalman Shan, M.D., [facility].C.P Pulmonary and Critical Care Medicine Staff Physician [facility] Pager: [phone], If no answer or between  15:00h - 7:00h: call [phone]  09/28/2016 5:55 AM

## 2016-10-29 NOTE — Progress Notes (Signed)
CRITICAL VALUE ALERT  Critical value received:  K 2.3  Date of notification:  10/21/2016   Time of notification:  0500  Critical value read back: yes  Nurse who received alert:  A. Lamar Benes RN  MD notified (1st page):  [phone]  CCM RN notified of need for replacement.

## 2016-10-29 NOTE — Progress Notes (Signed)
PULMONARY / CRITICAL CARE MEDICINE    ADMISSION DATE:  10/14/2016 REFERRING PROVIDER: Dr. Dalene Seltzer CHIEF COMPLAINT:  Dyspnea, confusion  HISTORY OF PRESENT ILLNESS:   66 yo male with hx of OSA/OHS and followed by cardiology.  Family reports he has not been able to get set up with PAP therapy at home.  He presented with dyspnea and altered mental status from acute on chronic hypoxic/hypercapnic respiratory failure leading to VDRF.  PMHx of CAD, AS, s/p CABG with AVR 2012, diastolic CHF, DM, HTN.    SUBJECTIVE:  Net negative 850 overnight / 10 L negative for admit.  Pt weaning on 15/5.  Fentanyl gtt at 150 mcg  VITAL SIGNS: BP 140/66   Pulse 88   Temp 99.1 F (37.3 C)   Resp (!) 27   Ht 5\' 11"  (1.803 m)   Wt (!) 460 lb (208.7 kg)   SpO2 (!) 89%   BMI 64.16 kg/m   INTAKE / OUTPUT: I/O last 3 completed shifts: In: 7325.4 [I.V.:2275.4; NG/GT:4700; IV Piggyback:350] Out: 6880 [Urine:6880]  PHYSICAL EXAMINATION: General:  Obese man, intubated, sedated Neuro:  Arousable, sedate HEENT: ETT, [place]/AT, PERRL, EOM-I Cardiovascular:  Distant , RRR , 1-2 +edema R>L  Lungs:  Non-labored, lungs bilaterally distant, faint crackles lower lateral Abdomen:  Obese but soft, BS+ Musculoskeletal:  No deformities  Skin:  Stasis dermatatic changes in LE , scaling around feet   LABS:  BMET  Recent Labs Lab 10/23/16 0421 03-Nov-2016 0400 2016/11/03 0524  NA 154* 154* 153*  K 2.6* 2.3* 3.0*  CL 101 114* 102  CO2 40* 29 41*  BUN 120* 101* 125*  CREATININE 1.70* 1.25* 1.52*  GLUCOSE 190* 123* 192*   Electrolytes  Recent Labs Lab 10/23/16 0421 Nov 03, 2016 0400 11-03-16 0524  CALCIUM 9.4 7.3* 9.2  MG 2.9* 2.3 3.0*  PHOS 4.4 3.3 4.7*    CBC  Recent Labs Lab 2016/11/03 0400 11/03/2016 0524 11/03/16 0900  WBC 8.7 12.9* 13.7*  HGB 6.4* 8.9* 9.3*  HCT 23.0* 32.1* 33.5*  PLT 228 377 357   Coag's No results for input(s): APTT, INR in the last 168 hours.  Sepsis Markers No results  for input(s): LATICACIDVEN, PROCALCITON, O2SATVEN in the last 168 hours. ABG  Recent Labs Lab 10/22/16 0426 10/23/16 0315 November 03, 2016 0500  PHART 7.438 7.390 7.373  PCO2ART 66.4* 73.2* 72.0*  PO2ART 64.0* 69.7* 62.3*   Liver Enzymes No results for input(s): AST, ALT, ALKPHOS, BILITOT, ALBUMIN in the last 168 hours. Cardiac Enzymes No results for input(s): TROPONINI, PROBNP in the last 168 hours.  Glucose  Recent Labs Lab 11-03-2016 0211 November 03, 2016 0316 11-03-16 0408 Nov 03, 2016 0523 11/03/16 0621 11/03/16 0738  GLUCAP 150* 126* 71 167* 195* 166*   Imaging Dg Chest Port 1 View  Result Date: 11-03-16 CLINICAL DATA:  66 year old male intubated, respiratory failure. Morbid obesity. Initial encounter. EXAM: PORTABLE CHEST 1 VIEW COMPARISON:  10/23/2016 and earlier. FINDINGS: Portable AP semi upright view at at 0600 hours. Endotracheal tube tip appears stable and in good position. Enteric tube courses toward the abdomen, tip not included. Lung volumes and mediastinal contours appear stable. Streaky perihilar and right lung base opacity is stable to mildly progressed. No pneumothorax. No large pleural effusion. IMPRESSION: 1. Stable endotracheal tube. 2. Stable lung volumes. Streaky perihilar and right basilar opacity may have mildly progressed since 10/22/2016. Consider bilateral respiratory infection. No large pleural effusion. Electronically Signed   By: Odessa Fleming M.D.   On: November 03, 2016 07:14   STUDIES:  Head  CT 12/16 >> no acute findings Lower extremity venous dopplers 12/18>> No obvious evidence of deep vein or superficial thrombosis involving the visualized veins of the right lower   extremity and left lower extremity.  CULTURES: None  ANTIBIOTICS: None  SIGNIFICANT EVENTS: 12/16 Intubated in the ED after failing BiPAP 12/22 Continued SBT failure  LINES/TUBES: ETT 12/16 >> R IJ TLC 12/18 >>   DISCUSSION: 66 y/o man with obesity hypoventilation syndrome presenting with  decompensated respiratory failure.  ASSESSMENT / PLAN:  Acute on chronic hypoxic/hypercapnic respiratory failure in setting of OSA/OHS. - PS trials  - Family planning for w/d of care ~ 2pm today.  Hopeful for lucid time with patient if possible - F/u CXR, ABG prn (CHF with pulmonary interstitial edema) - PRN BDs - Diureses as below - Family discussion, no trach/peg/dialysis.  Their goal is to extubate and have some time with the patient. - Palliative care consult  Cor pulmonale. Hx of CAD, bioprosthetic AVR, chronic diastolic CHF. DVT Rules Out 12/18 - Lasix 10 mg/hr gtt for now - Active K replacement  Anemia of critical illness. No obvious signs of bleeding - F/u CBC - noted error in lab reading 12/27 am, repeat Hgb pending  Leukocytosis - Follow fever curve and WBC - CXR prn - Culture for temp of >101.5  DM. - SSI - Insulin gtt  Hx of hypothyroidism. - Synthroid  Acute metabolic encephalopathy. - RASS goal 0  Renal failure: - Replete electrolytes as indicated - trend BMP / UOP   Sedation: - Fentanyl drip - PRN versed - Seroquel to minimize versed need, increase to 50 BID  DVT prophylaxis - lovenox SUP - pepcid Nutrition - protonix/ Tube feeds Goals of care - LCB with no CPR/cardioversion/dialysis/trach/peg.  Plan for withdrawal / comfort care afternoon 12/27  Family:  Ex-wife and sister updated at bedside.   CC Time: 30 minutes  Canary BrimBrandi Ollis, NP-C [place] Pulmonary & Critical Care Pgr: [phone] or if no answer [phone] 10/17/2016, 10:09 AM  Attending Note:  66 year old male with morbid obesity and right heart failure.  Patient has been intubated for 11 days.  Negative 10 liters since admission.  Renal function is slowly improving but continues to have difficulty with weaning.  On exam, diffuse crackles remain.  I reviewed CXR myself, ETT ok, pulmonary edema and pleural effusion noted.  Spoke with palliative care and family at length, they are at peace  at this point that patient would not want to continue this.  After discussion decision was made to proceed with comfort care.  Will start morphine and when family is ready will extubate.  The patient is critically ill with multiple organ systems failure and requires high complexity decision making for assessment and support, frequent evaluation and titration of therapies, application of advanced monitoring technologies and extensive interpretation of multiple databases.   Critical Care Time devoted to patient care services described in this note is  35  Minutes. This time reflects time of care of this signee Dr Koren BoundWesam Theoren Palka. This critical care time does not reflect procedure time, or teaching time or supervisory time of PA/NP/Med student/Med Resident etc but could involve care discussion time.  Alyson ReedyWesam G. Eddy Liszewski, M.D. [facility] Pulmonary/Critical Care Medicine. Pager: [phone]. After hours pager: [phone].

## 2016-10-29 NOTE — Consult Note (Signed)
Consultation Note Date: 11-01-2016   Patient Name: Antonio [name]  DOB: 05/30/1951  MRN: [id number]  Age / Sex: 66 y.o., male  PCP: Jefm Petty, MD Referring Physician: Rush Farmer, MD  Reason for Consultation: Establishing goals of care, Hospice Evaluation, Psychosocial/spiritual support and Terminal Care  HPI/Patient Profile: 66 y.o. male  with past medical history of chronic respiratory failure on 5L of oxygen at home, diastolic heart failure, aortic stenosis with AVR (bioprosthetic), morbid obesity, and brittle diabetes mellitus who was admitted on 10/03/2016 with altered mental status and acute on chronic respiratory failure.  He was unable to protect his airway and was intubated.  He was found to have acute on chronic diastolic heart failure and cor pulmonale.  He has been unable to successfully wean from the ventilator.  Critical care held goals of care discussions and determined that the patient would not have wanted prolonged life support, trach, PEG, or hemo dialysis.  As the patient has been intubated for 10+ days we have arrived at a critical time frame.  He his unable to wean and does not want a tracheostomy.     Clinical Assessment and Goals of Care: I met at bedside with the patient (sedated) his two daughters Otis Peak and Janett Billow) and his ex-wife, Vaughan Basta.  Per Otis Peak her father was a Pharmacist, hospital.  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  He is a very positive person who loves his family, daughters and [employer].   He lived at home alone with a care taker Lonn Georgia) who helped cook, bathe and clean.  The patient is Catholic and the family requests last rites prior to passing.  If his successfully extubates and stabilizes the family would like for him to be cared for at [facility] in [place].  As a group we reviewed his chest xrays and current lab results.  I  discussed the case at length with the bedside RN, and CCM MD and NP.    Primary Decision Maker:  HCPOA:  Daughter Joanie    SUMMARY OF RECOMMENDATIONS     Extubation planned for later today 2-3 pm.  Lanett requested for last rites.  Will continue insulin and CBGs for now as a comfort measure (patient required 100 units of insulin daily at home and has been requiring 8-10 units per hour inpatient).  Anticipate discontinuation of insulin gtt within 24 hours after extubation.  Medications/procedures not related to comfort have been discontinued (statins, synthroid, daily weights etc)  Continue diuretics for comfort.  If he survives, family wants [facility]   Code Status/Advance Care Planning:  DNR    Symptom Management:   Fentanyl GTT continued  Versed 1-2 mg IV per q 15 min PRN.   A dose will be given prior to extubation  Robinul for secretions (scheduled and PRN)  Continue lasix gtt and metolazone for now for comfort.  Palliative Prophylaxis:   Aspiration, Bowel Regimen and Frequent Pain Assessment  Additional Recommendations (Limitations,  Scope, Preferences):  Full Comfort Care, No Surgical Procedures and No Tracheostomy  Psycho-social/Spiritual:   Desire for further Chaplaincy support:yes  Additional Recommendations: Caregiving  Support/Resources  Prognosis:   Hours - Days  Discharge Planning: Anticipated Hospital Death      Primary Diagnoses: Present on Admission: . Encephalopathy . Obesity hypoventilation syndrome (HCC) . Obesity . EDEMA-LEGS,DUE TO VENOUS OBSTRUCT. . CKD (chronic kidney disease), stage III . Gout . HYPERTENSION, BENIGN SYSTEMIC . Hyperlipidemia . Respiratory failure with hypercapnia (HCC)   I have reviewed the medical record, interviewed the patient and family, and examined the patient. The following aspects are pertinent.  Past Medical History:  Diagnosis Date  . Anxiety   . Aortic stenosis     a. s/p AVR 11/29/11  . Arthritis   . Blindness of right eye    a. due to amblyopia as child  . Chronic diastolic heart failure (HCC)    a. Echo 08/11/2015 LVEF 55-60% with normal RV function  . DM2 (diabetes mellitus, type 2) (HCC)   . Gout   . Hepatitis   . History of acute renal failure   . HLD (hyperlipidemia)   . HTN (hypertension)   . Hx MRSA infection    a. thigh abcess 2006  . OSA on CPAP    moderate with AHI 26/hr   Social History   Social History  . Marital status: Divorced    Spouse name: N/A  . Number of children: N/A  . Years of education: N/A   Occupational History  . retired travelling Engineer, site    Social History Main Topics  . Smoking status: Never Smoker  . Smokeless tobacco: Never Used  . Alcohol use Yes     Comment: 02/18/2015 "once or twice/yr I'll have a cocktail"      rare  . Drug use: No  . Sexual activity: Not Currently   Other Topics Concern  . None   Social History Narrative  . None   Family History  Problem Relation Age of Onset  . Cancer Mother   . Heart attack Father    Scheduled Meds: . aspirin  81 mg Per Tube Daily  . atorvastatin  40 mg Per Tube q1800  . carvedilol  3.125 mg Per Tube BID  . chlorhexidine gluconate (MEDLINE KIT)  15 mL Mouth Rinse BID  . famotidine  20 mg Per Tube Q12H  . feeding supplement (PRO-STAT SUGAR FREE 64)  60 mL Per Tube TID  . free water  400 mL Per Tube Q4H  . insulin glargine  30 Units Subcutaneous BID  . levothyroxine  50 mcg Per Tube QAC breakfast  . magnesium hydroxide  15 mL Per Tube Daily  . mouth rinse  15 mL Mouth Rinse QID  . potassium chloride  40 mEq Per Tube Q4H while awake  . QUEtiapine  50 mg Oral BID  . sodium chloride flush  3 mL Intravenous Q12H   Continuous Infusions: . feeding supplement (VITAL HIGH PROTEIN) 1,000 mL (2016-11-18 0623)  . fentaNYL infusion INTRAVENOUS 100 mcg/hr (18-Nov-2016 0800)  . furosemide (LASIX) infusion 10 mg/hr (November 18, 2016 0700)  . insulin (NOVOLIN-R)  infusion 10.9 Units/hr (November 18, 2016 0900)   PRN Meds:.sodium chloride, acetaminophen **OR** acetaminophen, albuterol, fentaNYL, hydrALAZINE, ipratropium-albuterol, midazolam, ondansetron **OR** ondansetron (ZOFRAN) IV, sodium chloride flush No Known Allergies Review of Systems patient intubated unable to speak.  Physical Exam  Constitutional: He appears well-developed and well-nourished.  Obese.  Intubated.  HENT:  Head: Normocephalic and atraumatic.  Neck:  Neck supple.  Cardiovascular: Normal rate.   Murmur heard. Pulmonary/Chest: No respiratory distress. He has no wheezes. He has no rales.  NAD.  Intubated.  Abdominal:  Obese, NT, ND  Musculoskeletal: He exhibits edema. He exhibits no deformity.  Neurological:  Sedated.  Unable to assess.  Skin: Skin is warm and dry.  Psychiatric:  Sedated.  Unable to assess.     Vital Signs: BP 122/68   Pulse 76   Temp 99.1 F (37.3 C)   Resp (!) 31   Ht '5\' 11"'$  (1.803 m)   Wt (!) 208.7 kg (460 lb)   SpO2 (!) 86%   BMI 64.16 kg/m  Pain Assessment: CPOT   Pain Score: 0-No pain   SpO2: SpO2: (!) 86 % O2 Device:SpO2: (!) 86 % O2 Flow Rate: .O2 Flow Rate (L/min): 50 L/min  IO: Intake/output summary:  Intake/Output Summary (Last 24 hours) at 2016/11/15 1049 Last data filed at 11-15-2016 1000  Gross per 24 hour  Intake          3651.01 ml  Output             5825 ml  Net         -2173.99 ml    LBM: Last BM Date:  (PTA) Baseline Weight: Weight: (!) 179.2 kg (395 lb) Most recent weight: Weight: (!) 208.7 kg (460 lb)     Palliative Assessment/Data:     Time In: 9:30 Time Out: 11:30 Time Total: 2 hrs Greater than 50%  of this time was spent counseling and coordinating care related to the above assessment and plan.  Signed by: Imogene Burn, PA-C Palliative Medicine Pager: [phone]  Please contact Palliative Medicine Team phone at [phone] for questions and concerns.  For individual provider: See  Shea Evans

## 2016-10-29 NOTE — Progress Notes (Addendum)
[facility] Nursing ICU Electrolyte Replacement Protocol  Patient Name: Antonio [name] DOB: Aug 22, 1951 MRN: [id number]  Date of Service  11/10/16   HPI/Events of Note    Recent Labs Lab 10/20/16 0345 10/21/16 0400 10/21/16 1830 10/22/16 0300 10/23/16 0421 11-10-16 0400  NA 153* 153* 154* 154* 154* 154*  K 2.6* 2.6* 3.5 2.9* 2.6* 2.3*  CL 103 102 103 101 101 114*  CO2 42* 40* 43* 42* 40* 29  GLUCOSE 128* 171* 148* 139* 190* 123*  BUN 94* 99* 99* 111* 120* 101*  CREATININE 1.84* 1.68* 1.68* 1.75* 1.70* 1.25*  CALCIUM 8.8* 9.0 9.0 9.4 9.4 7.3*  MG 2.8* 2.7*  --  2.9* 2.9* 2.3  PHOS 3.9 3.9  --  4.1 4.4 3.3    Estimated Creatinine Clearance: 104.8 mL/min (by C-G formula based on SCr of 1.25 mg/dL (H)).  Intake/Output      12/26 0701 - 12/27 0700   I.V. (mL/kg) 1748.2 (8.5)   NG/GT 2390   IV Piggyback 250   Total Intake(mL/kg) 4388.2 (21.3)   Urine (mL/kg/hr) 4800 (1)   Emesis/NG output 0 (0)   Total Output 4800   Net -411.8        - I/O DETAILED x24h    Total I/O In: 1032.1 [I.V.:332.1; NG/GT:600; IV Piggyback:100] Out: 1980 [Urine:1980] - I/O THIS SHIFT    ASSESSMENT   eICURN Interventions  K+2.3 critically low. Unable to replace using protocol. Hbg 6.4  MD notified.    ASSESSMENT: MAJOR ELECTROLYTE    Merita Norton 2016/11/10, 5:23 AM

## 2016-10-29 DEATH — deceased

## 2016-11-07 ENCOUNTER — Encounter (HOSPITAL_COMMUNITY): Payer: Medicare Other

## 2016-11-29 NOTE — Discharge Summary (Signed)
NAME:  Antonio [name], Antonio [name]              ACCOUNT NO.:  [id number]  MEDICAL RECORD NO.:  [id number]  LOCATION:                                FACILITY:  [facility]  PHYSICIAN:  Felipa Evener, MD  DATE OF BIRTH:  [birth year]  DATE OF ADMISSION:  11-03-16 DATE OF DISCHARGE:  10/12/2016                              DISCHARGE SUMMARY   PRIMARY DIAGNOSIS/CAUSE OF DEATH:  Right-sided heart failure.  SECONDARY DIAGNOSIS:  Acute respiratory failure, hypercarbia, hypoxia, cor pulmonale, critical illness, leukocytosis, diabetes mellitus, acute metabolic encephalopathy, acute renal failure, acute on chronic rather renal failure.  HOSPITAL COURSE:  The patient is a 66 year old, morbidly obese male who with history of obstructive sleep apnea and pulmonary hypertension presents to the hospital with decompensated right-sided heart failure with anasarca fluid overload.  The patient was intubated due to acute or chronic pulmonary edema and right heart failure.  The patient was intubated in the intensive care unit for 2 weeks after which point of time meeting with the family who informed that the patient would not want tracheostomy and a feeding tube.  Given the fact that the patient was not extubated at that time, the family decided to proceed with comfort care.  Morphine was started.  The patient was extubated, expired shortly thereafter with the family at bedside.     Felipa Evener, MD   ______________________________ Felipa Evener, MD    WJY/MEDQ  D:  11/12/2016  T:  11/12/2016  Job:  [id number]

## 2018-01-21 IMAGING — CR DG CHEST 1V PORT
1 series · 1 of 1 positions shown · non-contrast
Comparison: 01/01/2016

CLINICAL DATA: Respiratory failure

EXAM:
PORTABLE CHEST 1 VIEW

[AP]
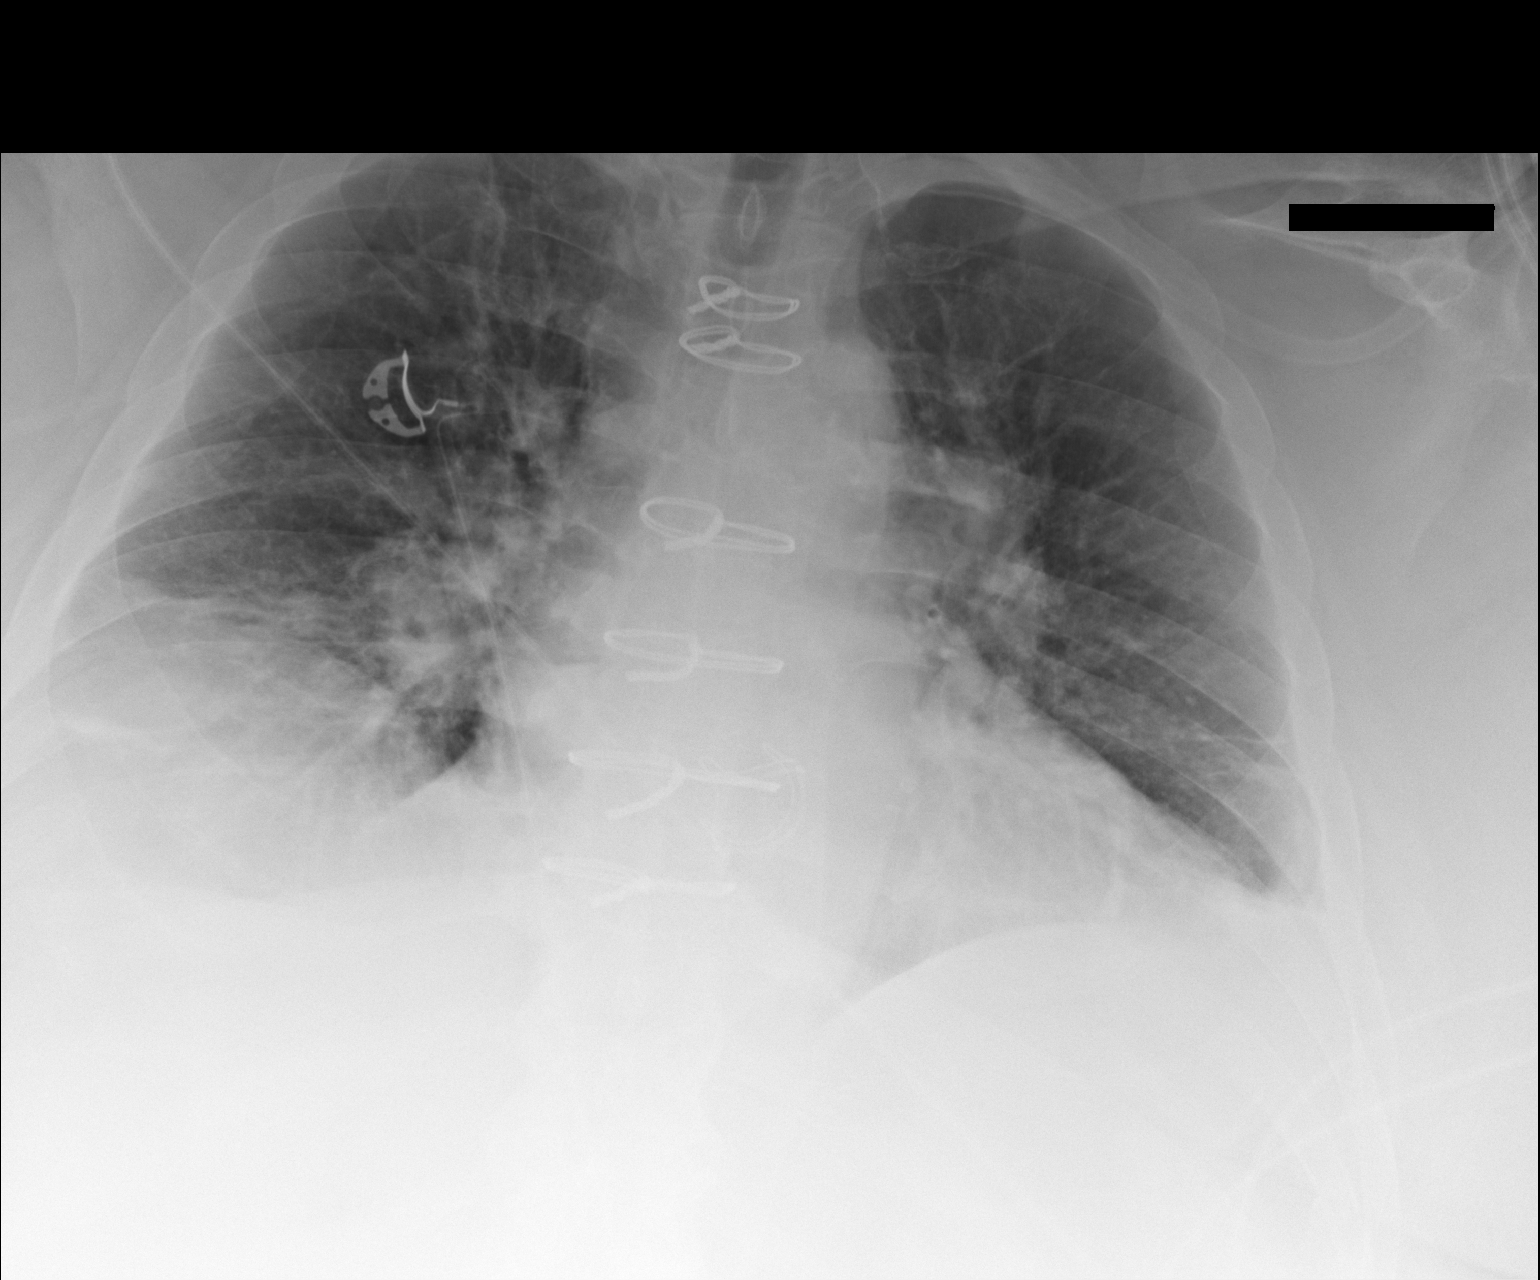

[1 of 1 positions shown; findings below may reference images not displayed]

FINDINGS: Cardiac shadow is enlarged but stable. Postsurgical changes are
again seen. The lungs are well aerated bilaterally with bibasilar
atelectatic changes. No focal infiltrate is seen. No acute bony
abnormality is noted.
IMPRESSION: Bibasilar atelectatic changes stable from the previous exam.

## 2018-06-21 IMAGING — CR DG CHEST 1V PORT
1 series · 1 of 1 positions shown · non-contrast
Comparison: Chest radiograph 02/23/2016

CLINICAL DATA: Patient with shortness of breath.

EXAM:
PORTABLE CHEST 1 VIEW

[AP]
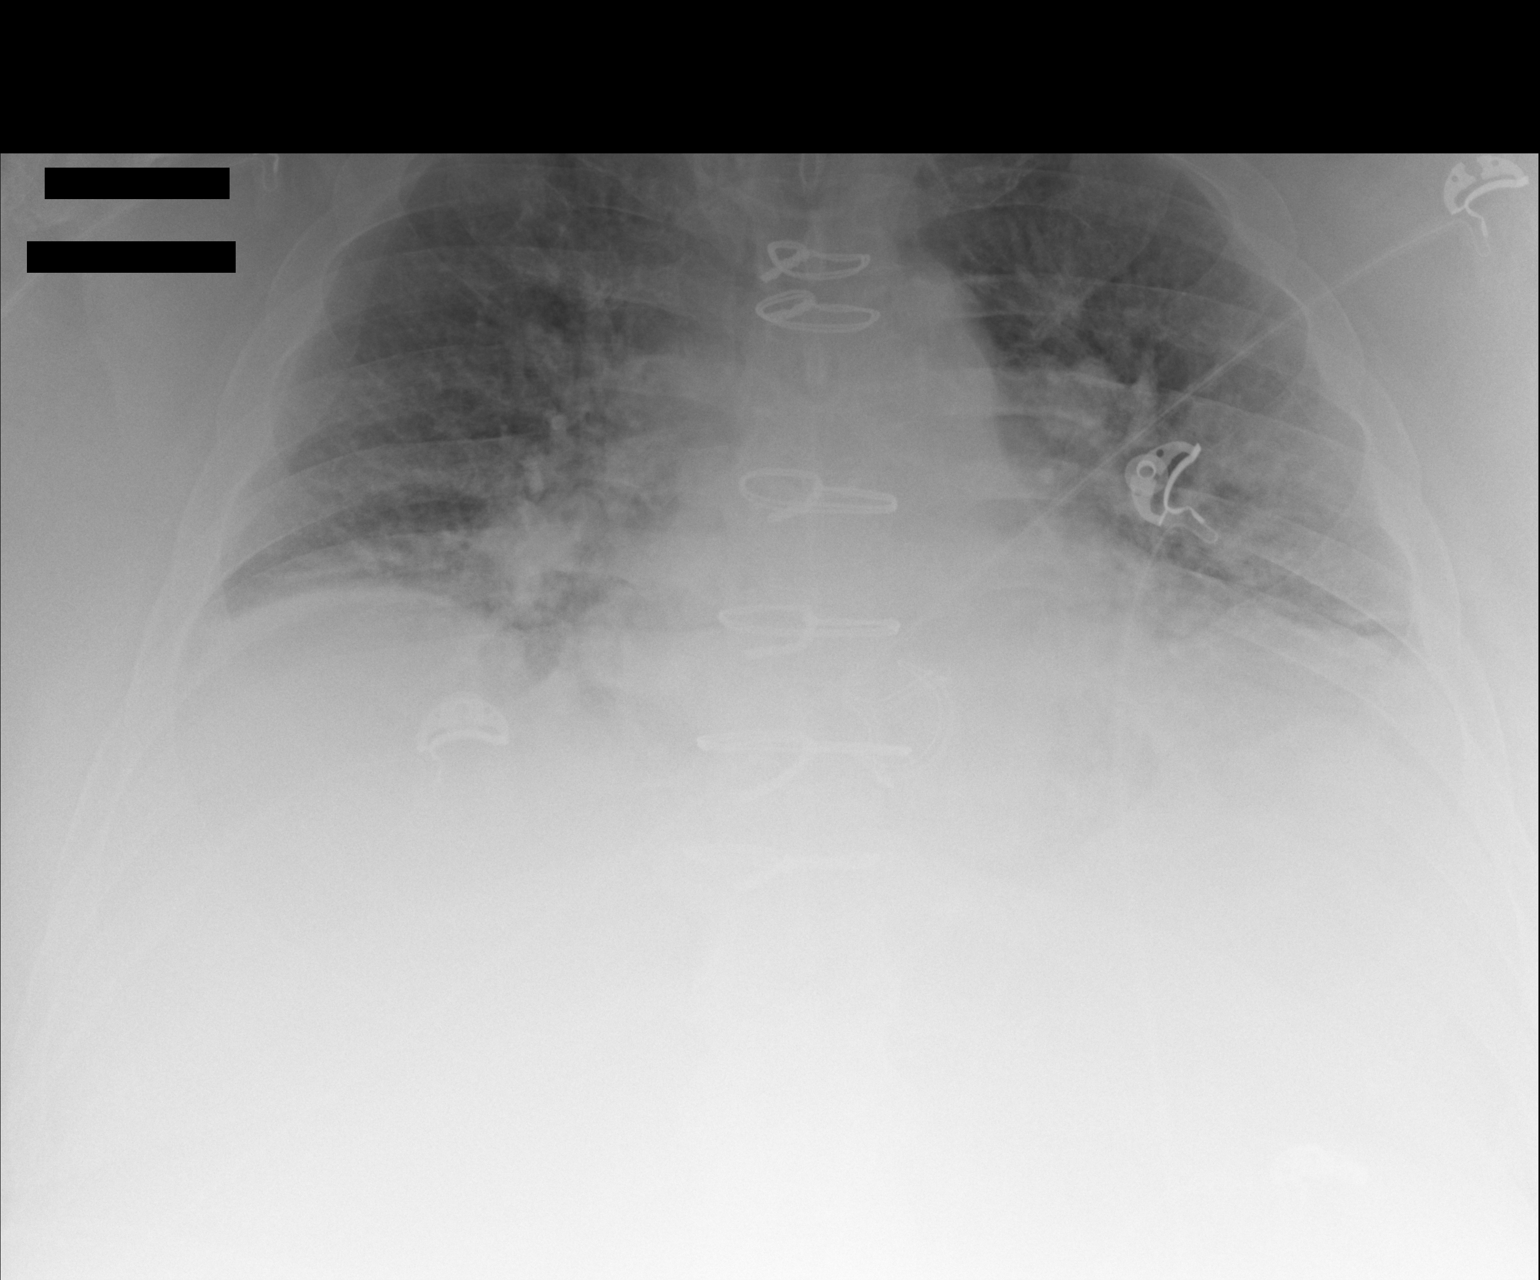

[1 of 1 positions shown; findings below may reference images not displayed]

FINDINGS: Low lung volumes. Marked cardiomegaly. Monitoring leads overlie the
patient. Status post median sternotomy. Pulmonary vascular
redistribution and new bilateral interstitial pulmonary opacities.
Probable small bilateral pleural effusions.
IMPRESSION: Cardiomegaly and interstitial pulmonary edema with small bilateral
pleural effusions.

Markedly low lung volumes.
